# Patient Record
Sex: Male | Born: 1960 | Race: White | Hispanic: No | Marital: Single | Smoking: Never smoker
Health system: Southern US, Community
[De-identification: ages and names within clinical notes are randomized; demographics above are authoritative.]

## PROBLEM LIST (undated history)

## (undated) DIAGNOSIS — M779 Enthesopathy, unspecified: Secondary | ICD-10-CM

## (undated) DIAGNOSIS — C801 Malignant (primary) neoplasm, unspecified: Secondary | ICD-10-CM

## (undated) HISTORY — DX: Malignant (primary) neoplasm, unspecified: C80.1

---

## 1986-12-04 HISTORY — PX: HERNIA REPAIR: SHX51

## 2009-02-01 ENCOUNTER — Emergency Department (HOSPITAL_COMMUNITY): Admission: EM | Admit: 2009-02-01 | Discharge: 2009-02-01 | Payer: Self-pay | Admitting: Emergency Medicine

## 2017-06-18 ENCOUNTER — Emergency Department (HOSPITAL_COMMUNITY): Payer: Medicaid Other

## 2017-06-18 ENCOUNTER — Emergency Department (HOSPITAL_COMMUNITY)
Admission: EM | Admit: 2017-06-18 | Discharge: 2017-06-18 | Disposition: A | Discharge status: Home / Self Care | Payer: Medicaid Other | Attending: Emergency Medicine | Admitting: Emergency Medicine

## 2017-06-18 ENCOUNTER — Encounter (HOSPITAL_COMMUNITY): Payer: Self-pay

## 2017-06-18 DIAGNOSIS — F17290 Nicotine dependence, other tobacco product, uncomplicated: Secondary | ICD-10-CM | POA: Diagnosis not present

## 2017-06-18 DIAGNOSIS — M5432 Sciatica, left side: Secondary | ICD-10-CM | POA: Insufficient documentation

## 2017-06-18 DIAGNOSIS — M545 Low back pain: Secondary | ICD-10-CM | POA: Diagnosis present

## 2017-06-18 HISTORY — DX: Enthesopathy, unspecified: M77.9

## 2017-06-18 MED ORDER — MORPHINE SULFATE (PF) 4 MG/ML IV SOLN
4.0000 mg | Freq: Once | INTRAVENOUS | Status: AC
  Administered 2017-06-18: 4 mg via INTRAVENOUS
  Filled 2017-06-18: qty 1

## 2017-06-18 MED ORDER — PREDNISONE 50 MG PO TABS: 50.0000 mg | ORAL_TABLET | Freq: Every day | ORAL | 0 refills | Status: DC

## 2017-06-18 MED ORDER — NAPROXEN 375 MG PO TABS: 375.0000 mg | ORAL_TABLET | Freq: Two times a day (BID) | ORAL | 0 refills | Status: DC

## 2017-06-18 MED ORDER — CYCLOBENZAPRINE HCL 10 MG PO TABS: 10.0000 mg | ORAL_TABLET | Freq: Two times a day (BID) | ORAL | 0 refills | Status: DC | PRN

## 2017-06-18 MED ORDER — TRAMADOL HCL 50 MG PO TABS: 50.0000 mg | ORAL_TABLET | Freq: Four times a day (QID) | ORAL | 0 refills | Status: DC | PRN

## 2017-06-18 NOTE — ED Notes (Signed)
Bed: WA10 Expected date:  Expected time:  Means of arrival:  Comments: EMS/back pain 

## 2017-06-18 NOTE — Discharge Instructions (Signed)
Follow-up with a primary care doctor or neurosurgeon as we discussed. Take the medications to help with the pain and discomfort. Return to the emergency room for difficulty with weakness, worsening neurologic symptoms

## 2017-06-18 NOTE — ED Triage Notes (Signed)
Per EMS- patient c/o left lower back pain that radiates down the left leg, left knee. Paatient also c/o tingling of the left lower extremity. Patient was given Fentanyl 100 mcg Iv prior to arrival to the ED.

## 2017-06-18 NOTE — ED Notes (Signed)
Patient transported to X-ray 

## 2017-06-18 NOTE — ED Provider Notes (Signed)
[facility] DEPT Provider Note   CSN: [id number] Arrival date & time: 06/18/17  0806     History   Chief Complaint Chief Complaint  Patient presents with  . Back Pain    left lower  . Knee Pain    left    HPI Cory Ibarra is a 56 y.o. male.  HPI Patient presents to the emergency room with complaints of lower back pain. Patient states he's been having some mild intermittent symptoms starting about a month ago. He did not think too much of it. However he started having pain in the last couple of days in his left lower back radiating to his left hip and knee. Last night the pain was more severe. This morning was even worsening or difficulty standing. He gets a throbbing severe pain in his left thigh and knee originating from the lower back when he tries to move or stand. He also feels that the thigh and knee are numb. He denies any weakness. He denies any abdominal pain. No fevers or chills. No recent falls or injuries. However, he was helping a friend move this past weekend while he did not think he was lifting anything that heavy. Past Medical History:  Diagnosis Date  . Tendonitis     There are no active problems to display for this patient.   Past Surgical History:  Procedure Laterality Date  . HERNIA REPAIR         Home Medications    Prior to Admission medications   Medication Sig Start Date End Date Taking? Authorizing Provider  aspirin-acetaminophen-caffeine (EXCEDRIN MIGRAINE) [phone] MG tablet Take 1 tablet by mouth every 6 (six) hours as needed.   Yes [provider]  ibuprofen (ADVIL,MOTRIN) 200 MG tablet Take 400 mg by mouth every 6 (six) hours as needed for headache.   Yes [provider]  cyclobenzaprine (FLEXERIL) 10 MG tablet Take 1 tablet (10 mg total) by mouth 2 (two) times daily as needed for muscle spasms. 06/18/17   Dorie Rank, MD  naproxen (NAPROSYN) 375 MG tablet Take 1 tablet (375 mg total) by mouth 2 (two) times daily. 06/18/17    Dorie Rank, MD  predniSONE (DELTASONE) 50 MG tablet Take 1 tablet (50 mg total) by mouth daily. 06/18/17   Dorie Rank, MD  traMADol (ULTRAM) 50 MG tablet Take 1 tablet (50 mg total) by mouth every 6 (six) hours as needed. 06/18/17   Dorie Rank, MD    Family History History reviewed. No pertinent family history.  Social History Social History  Substance Use Topics  . Smoking status: Current Every Day Smoker    Types: E-cigarettes  . Smokeless tobacco: Never Used  . Alcohol use Yes     Comment: occasionally     Allergies   Bee venom   Review of Systems Review of Systems  All other systems reviewed and are negative.    Physical Exam Updated Vital Signs BP (!) 162/58   Pulse 80   Temp 97.9 F (36.6 C) (Oral)   Resp 16   Ht 1.88 m (6\' 2" )   Wt 104.3 kg (230 lb)   SpO2 100%   BMI 29.53 kg/m   Physical Exam  Constitutional: He appears well-developed and well-nourished. No distress.  HENT:  Head: Normocephalic and atraumatic.  Right Ear: External ear normal.  Left Ear: External ear normal.  Nose: Nose normal.  Eyes: Conjunctivae and EOM are normal. Right eye exhibits no discharge. Left eye exhibits no discharge. No scleral icterus.  Neck: Neck supple. No tracheal deviation present.  Cardiovascular: Normal rate, regular rhythm and normal heart sounds.   Pulmonary/Chest: Effort normal and breath sounds normal. No stridor. No respiratory distress. He has no wheezes.  Abdominal: Soft. He exhibits no distension and no mass. There is no tenderness. There is no guarding.  Musculoskeletal: He exhibits no edema.       Left hip: He exhibits tenderness. He exhibits no swelling and no deformity.       Left knee: Normal.       Lumbar back: He exhibits decreased range of motion, tenderness, pain and spasm. He exhibits no bony tenderness, no swelling and no edema.  Neurological: He is alert. He is not disoriented. A sensory deficit ( altered sensation left thigh to the knee) is  present. Cranial nerve deficit: no gross deficits. He exhibits normal muscle tone. Coordination normal.  5/5 plantar and dorsiflexion bilaterally, able to lift legs off the bed  Skin: Skin is warm and dry. No rash noted. He is not diaphoretic. No erythema.  Psychiatric: He has a normal mood and affect. His behavior is normal. Thought content normal.  Nursing note and vitals reviewed.    ED Treatments / Results    Radiology Dg Lumbar Spine Complete  Result Date: 06/18/2017 CLINICAL DATA:  Back pain. EXAM: LUMBAR SPINE - COMPLETE 4+ VIEW COMPARISON:  None. FINDINGS: There is no evidence of lumbar spine fracture. Alignment is normal. Intervertebral disc spaces are maintained. There is a calcification noted within the right lower quadrant of the abdomen which may represent an at appendicolith. IMPRESSION: Unremarkable appearance of the lumbar spine. Electronically Signed   By: Kerby Moors M.D.   On: 06/18/2017 09:14    Procedures Procedures (including critical care time)  Medications Ordered in ED Medications  morphine 4 MG/ML injection 4 mg (4 mg Intravenous Given 06/18/17 0849)     Initial Impression / Assessment and Plan / ED Course  I have reviewed the triage vital signs and the nursing notes.  Pertinent labs & imaging results that were available during my care of the patient were reviewed by me and considered in my medical decision making (see chart for details).  Clinical Course as of Jun 18 1010  Mon Jun 18, 2017  [id number] Lumbar spine xrays negative.    [JK]    Clinical Course User Index [JK] Dorie Rank, MD    No sign of acute neurological or vascular emergency associated with pt's back pain.  May have a component of sciatica.  Safe for outpatient follow up.   Final Clinical Impressions(s) / ED Diagnoses   Final diagnoses:  Sciatica of left side    New Prescriptions New Prescriptions   CYCLOBENZAPRINE (FLEXERIL) 10 MG TABLET    Take 1 tablet (10 mg total) by mouth  2 (two) times daily as needed for muscle spasms.   NAPROXEN (NAPROSYN) 375 MG TABLET    Take 1 tablet (375 mg total) by mouth 2 (two) times daily.   PREDNISONE (DELTASONE) 50 MG TABLET    Take 1 tablet (50 mg total) by mouth daily.   TRAMADOL (ULTRAM) 50 MG TABLET    Take 1 tablet (50 mg total) by mouth every 6 (six) hours as needed.     Dorie Rank, MD 06/18/17 1011

## 2017-06-28 ENCOUNTER — Emergency Department (HOSPITAL_COMMUNITY): Payer: Medicaid Other

## 2017-06-28 ENCOUNTER — Inpatient Hospital Stay (HOSPITAL_COMMUNITY): Payer: Medicaid Other

## 2017-06-28 ENCOUNTER — Ambulatory Visit: Admit: 2017-06-28 | Payer: Self-pay | Admitting: Radiation Oncology

## 2017-06-28 ENCOUNTER — Inpatient Hospital Stay (HOSPITAL_COMMUNITY)
Admission: EM | Admit: 2017-06-28 | Discharge: 2017-07-03 | DRG: 543 | Disposition: A | Discharge status: Home / Self Care | Payer: Medicaid Other | Attending: Internal Medicine | Admitting: Internal Medicine

## 2017-06-28 ENCOUNTER — Encounter (HOSPITAL_COMMUNITY): Payer: Self-pay | Admitting: *Deleted

## 2017-06-28 DIAGNOSIS — Z801 Family history of malignant neoplasm of trachea, bronchus and lung: Secondary | ICD-10-CM | POA: Diagnosis not present

## 2017-06-28 DIAGNOSIS — M899 Disorder of bone, unspecified: Secondary | ICD-10-CM

## 2017-06-28 DIAGNOSIS — R222 Localized swelling, mass and lump, trunk: Secondary | ICD-10-CM

## 2017-06-28 DIAGNOSIS — R1900 Intra-abdominal and pelvic swelling, mass and lump, unspecified site: Secondary | ICD-10-CM

## 2017-06-28 DIAGNOSIS — M8458XA Pathological fracture in neoplastic disease, other specified site, initial encounter for fracture: Secondary | ICD-10-CM | POA: Diagnosis present

## 2017-06-28 DIAGNOSIS — S32030A Wedge compression fracture of third lumbar vertebra, initial encounter for closed fracture: Secondary | ICD-10-CM | POA: Diagnosis present

## 2017-06-28 DIAGNOSIS — R911 Solitary pulmonary nodule: Secondary | ICD-10-CM

## 2017-06-28 DIAGNOSIS — Z8042 Family history of malignant neoplasm of prostate: Secondary | ICD-10-CM

## 2017-06-28 DIAGNOSIS — M5430 Sciatica, unspecified side: Secondary | ICD-10-CM | POA: Diagnosis present

## 2017-06-28 DIAGNOSIS — Z91030 Bee allergy status: Secondary | ICD-10-CM | POA: Diagnosis not present

## 2017-06-28 DIAGNOSIS — F17290 Nicotine dependence, other tobacco product, uncomplicated: Secondary | ICD-10-CM | POA: Diagnosis present

## 2017-06-28 DIAGNOSIS — S32030S Wedge compression fracture of third lumbar vertebra, sequela: Secondary | ICD-10-CM

## 2017-06-28 DIAGNOSIS — R1907 Generalized intra-abdominal and pelvic swelling, mass and lump: Secondary | ICD-10-CM

## 2017-06-28 DIAGNOSIS — C8330 Diffuse large B-cell lymphoma, unspecified site: Secondary | ICD-10-CM | POA: Diagnosis present

## 2017-06-28 DIAGNOSIS — R634 Abnormal weight loss: Secondary | ICD-10-CM | POA: Diagnosis present

## 2017-06-28 DIAGNOSIS — M549 Dorsalgia, unspecified: Secondary | ICD-10-CM

## 2017-06-28 DIAGNOSIS — M898X8 Other specified disorders of bone, other site: Secondary | ICD-10-CM

## 2017-06-28 DIAGNOSIS — M25552 Pain in left hip: Secondary | ICD-10-CM

## 2017-06-28 DIAGNOSIS — C799 Secondary malignant neoplasm of unspecified site: Secondary | ICD-10-CM

## 2017-06-28 LAB — COMPREHENSIVE METABOLIC PANEL
ALBUMIN: 3.9 g/dL (ref 3.5–5.0)
ALT: 30 U/L (ref 17–63)
AST: 45 U/L — AB (ref 15–41)
Alkaline Phosphatase: 101 U/L (ref 38–126)
Anion gap: 11 (ref 5–15)
BUN: 14 mg/dL (ref 6–20)
CHLORIDE: 104 mmol/L (ref 101–111)
CO2: 23 mmol/L (ref 22–32)
CREATININE: 0.84 mg/dL (ref 0.61–1.24)
Calcium: 9.1 mg/dL (ref 8.9–10.3)
GFR calc Af Amer: >60 mL/min (ref >60)
GFR calc non Af Amer: >60 mL/min (ref >60)
GLUCOSE: 108 mg/dL — AB (ref 65–99)
Potassium: 4.1 mmol/L (ref 3.5–5.1)
SODIUM: 138 mmol/L (ref 135–145)
Total Bilirubin: 1.6 mg/dL — ABNORMAL HIGH (ref 0.3–1.2)
Total Protein: 7.6 g/dL (ref 6.5–8.1)

## 2017-06-28 LAB — CBC WITH DIFFERENTIAL/PLATELET
Basophils Absolute: 0 10*3/uL (ref 0.0–0.1)
Basophils Relative: 0 %
EOS ABS: 0.1 10*3/uL (ref 0.0–0.7)
EOS PCT: 1 %
HCT: 40.7 % (ref 39.0–52.0)
HEMOGLOBIN: 14.1 g/dL (ref 13.0–17.0)
LYMPHS ABS: 1 10*3/uL (ref 0.7–4.0)
Lymphocytes Relative: 13 %
MCH: 26 pg (ref 26.0–34.0)
MCHC: 34.6 g/dL (ref 30.0–36.0)
MCV: 75.1 fL — ABNORMAL LOW (ref 78.0–100.0)
MONO ABS: 1 10*3/uL (ref 0.1–1.0)
MONOS PCT: 13 %
NEUTROS PCT: 73 %
Neutro Abs: 5.7 10*3/uL (ref 1.7–7.7)
Platelets: 332 10*3/uL (ref 150–400)
RBC: 5.42 MIL/uL (ref 4.22–5.81)
RDW: 14.7 % (ref 11.5–15.5)
WBC: 7.8 10*3/uL (ref 4.0–10.5)

## 2017-06-28 LAB — URINALYSIS, ROUTINE W REFLEX MICROSCOPIC
Bilirubin Urine: NEGATIVE
Glucose, UA: NEGATIVE mg/dL
Ketones, ur: NEGATIVE mg/dL
Leukocytes, UA: NEGATIVE
Nitrite: NEGATIVE
PH: 5 (ref 5.0–8.0)
Protein, ur: NEGATIVE mg/dL
SPECIFIC GRAVITY, URINE: 1.02 (ref 1.005–1.030)

## 2017-06-28 LAB — LACTATE DEHYDROGENASE: LDH: 461 U/L — AB (ref 98–192)

## 2017-06-28 MED ORDER — KETOROLAC TROMETHAMINE 30 MG/ML IJ SOLN
30.0000 mg | Freq: Four times a day (QID) | INTRAMUSCULAR | Status: DC | PRN
  Administered 2017-06-29: 30 mg via INTRAVENOUS
  Filled 2017-06-28: qty 1

## 2017-06-28 MED ORDER — SODIUM CHLORIDE 0.9 % IV SOLN: 250.0000 mL | INTRAVENOUS | Status: DC | PRN

## 2017-06-28 MED ORDER — MORPHINE SULFATE (PF) 2 MG/ML IV SOLN
4.0000 mg | Freq: Once | INTRAVENOUS | Status: AC
  Administered 2017-06-28: 4 mg via INTRAVENOUS
  Filled 2017-06-28: qty 2

## 2017-06-28 MED ORDER — DOCUSATE SODIUM 100 MG PO CAPS: 100.0000 mg | ORAL_CAPSULE | Freq: Every day | ORAL | Status: DC | PRN

## 2017-06-28 MED ORDER — DEXAMETHASONE SODIUM PHOSPHATE 4 MG/ML IJ SOLN
8.0000 mg | Freq: Two times a day (BID) | INTRAMUSCULAR | Status: DC
  Administered 2017-06-28 – 2017-07-02 (×8): 8 mg via INTRAVENOUS
  Filled 2017-06-28 (×9): qty 2

## 2017-06-28 MED ORDER — OXYCODONE-ACETAMINOPHEN 5-325 MG PO TABS
1.0000 | ORAL_TABLET | Freq: Once | ORAL | Status: AC
Start: 2017-06-28 — End: 2017-06-28
  Administered 2017-06-28: 1 via ORAL
  Filled 2017-06-28: qty 1

## 2017-06-28 MED ORDER — SODIUM CHLORIDE 0.9% FLUSH
3.0000 mL | INTRAVENOUS | Status: DC | PRN
  Administered 2017-07-02: 3 mL via INTRAVENOUS
  Filled 2017-06-28: qty 3

## 2017-06-28 MED ORDER — GADOBENATE DIMEGLUMINE 529 MG/ML IV SOLN
20.0000 mL | Freq: Once | INTRAVENOUS | Status: AC | PRN
  Administered 2017-06-28: 20 mL via INTRAVENOUS

## 2017-06-28 MED ORDER — OXYCODONE HCL 5 MG PO TABS
5.0000 mg | ORAL_TABLET | ORAL | Status: DC | PRN
  Administered 2017-06-28 – 2017-07-03 (×9): 5 mg via ORAL
  Filled 2017-06-28 (×9): qty 1

## 2017-06-28 MED ORDER — SODIUM CHLORIDE 0.9% FLUSH
3.0000 mL | Freq: Two times a day (BID) | INTRAVENOUS | Status: DC
  Administered 2017-06-28 – 2017-07-03 (×11): 3 mL via INTRAVENOUS

## 2017-06-28 MED ORDER — ONDANSETRON HCL 4 MG PO TABS
4.0000 mg | ORAL_TABLET | Freq: Four times a day (QID) | ORAL | Status: DC | PRN
  Administered 2017-07-01 – 2017-07-03 (×3): 4 mg via ORAL
  Filled 2017-06-28 (×3): qty 1

## 2017-06-28 MED ORDER — ONDANSETRON HCL 4 MG/2ML IJ SOLN: 4.0000 mg | Freq: Four times a day (QID) | INTRAMUSCULAR | Status: DC | PRN

## 2017-06-28 NOTE — ED Notes (Signed)
ED Provider at bedside. 

## 2017-06-28 NOTE — H&P (Signed)
History and Physical    Cory Ibarra WEX:[id number] DOB: December 01, 1961 DOA: 06/28/2017  PCP: Patient, No Pcp Per  Patient coming from:  home  Chief Complaint:   Severe pain  HPI: Cory Ibarra is a 56 y.o. male with medical history significant of progressive worsening pain and loss of appetite for about 6 weeks now.  Pt has not seen a doctor in many years.  He gets no surviellance chronically that is age appropriate.  He has no health insurance.  About 6 weeks ago he started with vague symptoms of mild abdominal discomfort.  This progressed to back pain in the lower area then bilateral hip pain much worse in the left hip and left knee pain.  He came to ED over a week ago, was diagnosed with sciatica given steroids.  He says he got a little better with the steroids but as soon as he finished the taper this past Saturday (5 days ago) the pain has become excruciating to the point where he cannot even stand without severe pain in his left hip and leg and lower back.  He denies any fevers.  Has not had an appetite for over a month and has lost an noticeable unknown amount of weight.  He denies any urinary symptoms.  No melena or hematochezia.  No hematuria.  He has a very strong family history of prostate cancer.  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  Dr Jana Hakim with our oncology service has been called and will be seeing the patient to provide a possible plan for urgent radiation therapy.  Review of Systems: As per HPI otherwise 10 point review of systems negative.   Past Medical History:  Diagnosis Date  . Tendonitis     Past Surgical History:  Procedure Laterality Date  . HERNIA REPAIR       reports that he has been smoking E-cigarettes.  He has never used smokeless tobacco. He reports that he drinks alcohol. He reports that he does not use drugs.  Allergies  Allergen Reactions  . Bee Venom Swelling    Fhx:   2 brothers with prostate cancer in the their 63s one brother with testicular cancer in his 37s.  No colon cancer or other types of cancer he is aware of  Prior to Admission medications   Medication Sig Start Date End Date Taking? Authorizing Provider  ibuprofen (ADVIL,MOTRIN) 200 MG tablet Take 200 mg by mouth every 6 (six) hours as needed for fever, headache, mild pain, moderate pain or cramping.    Yes [provider]  naproxen sodium (ANAPROX) 220 MG tablet Take 440 mg by mouth every 8 (eight) hours as needed (for pain).   Yes [provider]  cyclobenzaprine (FLEXERIL) 10 MG tablet Take 1 tablet (10 mg total) by mouth 2 (two) times daily as needed for muscle spasms. Patient not taking: Reported on 06/28/2017 06/18/17   Dorie Rank, MD  naproxen (NAPROSYN) 375 MG tablet Take 1 tablet (375 mg total) by mouth 2 (two) times daily. Patient not taking: Reported on 06/28/2017 06/18/17   Dorie Rank, MD  predniSONE (DELTASONE) 50 MG tablet Take 1 tablet (50 mg total) by mouth daily. Patient not taking: Reported on 06/28/2017 06/18/17   Dorie Rank, MD  traMADol (ULTRAM) 50 MG tablet Take 1 tablet (50 mg total) by mouth every 6 (six) hours as needed. Patient not taking: Reported on 06/28/2017 06/18/17   Tomi Bamberger,  Wille Glaser, MD    Physical Exam: Vitals:   06/28/17 0834 06/28/17 1206  BP: (!) 148/67 (!) 149/95  Pulse: (!) 102 (!) 102  Resp: 16 12  Temp: 98.1 F (36.7 C) 97.9 F (36.6 C)  TempSrc: Oral Oral  SpO2: 98% 99%    Constitutional: NAD, calm, comfortable Vitals:   06/28/17 0834 06/28/17 1206  BP: (!) 148/67 (!) 149/95  Pulse: (!) 102 (!) 102  Resp: 16 12  Temp: 98.1 F (36.7 C) 97.9 F (36.6 C)  TempSrc: Oral Oral  SpO2: 98% 99%   Eyes: PERRL, lids and conjunctivae normal ENMT: Mucous membranes are moist. Posterior pharynx clear of any exudate or lesions.Normal dentition.  Neck: normal, supple, no masses, no thyromegaly Respiratory: clear to auscultation bilaterally, no  wheezing, no crackles. Normal respiratory effort. No accessory muscle use.  Cardiovascular: Regular rate and rhythm, no murmurs / rubs / gallops. No extremity edema. 2+ pedal pulses. No carotid bruits.  Abdomen: no tenderness, no masses palpated. No hepatosplenomegaly. Bowel sounds positive.  Musculoskeletal: no clubbing / cyanosis. No joint deformity upper and lower extremities. Good ROM, no contractures. Normal muscle tone.  Skin: no rashes, lesions, ulcers. No induration Neurologic: CN 2-12 grossly intact. Sensation intact, DTR normal. Strength 5/5 in all 4.  Psychiatric: Normal judgment and insight. Alert and oriented x 3. Normal mood.    Labs on Admission: I have personally reviewed following labs and imaging studies  CBC:  Recent Labs Lab 06/28/17 0919  WBC 7.8  NEUTROABS 5.7  HGB 14.1  HCT 40.7  MCV 75.1*  PLT 950   Basic Metabolic Panel:  Recent Labs Lab 06/28/17 0919  NA 138  K 4.1  CL 104  CO2 23  GLUCOSE 108*  BUN 14  CREATININE 0.84  CALCIUM 9.1   GFR: Estimated Creatinine Clearance: 123.4 mL/min (by C-G formula based on SCr of 0.84 mg/dL). Liver Function Tests:  Recent Labs Lab 06/28/17 0919  AST 45*  ALT 30  ALKPHOS 101  BILITOT 1.6*  PROT 7.6  ALBUMIN 3.9    Urine analysis:    Component Value Date/Time   COLORURINE YELLOW 06/28/2017 [place] 06/28/2017 0937   LABSPEC 1.020 06/28/2017 0937   PHURINE 5.0 06/28/2017 0937   GLUCOSEU NEGATIVE 06/28/2017 0937   HGBUR SMALL (A) 06/28/2017 [employer] 06/28/2017 [place] 06/28/2017 0937   PROTEINUR NEGATIVE 06/28/2017 0937   NITRITE NEGATIVE 06/28/2017 0937   LEUKOCYTESUR NEGATIVE 06/28/2017 9326   Radiological Exams on Admission: Dg Knee 2 Views Left  Result Date: 06/28/2017 CLINICAL DATA:  Left hip and leg pain which is worsening. EXAM: LEFT KNEE - 1-2 VIEW COMPARISON:  None. FINDINGS: No joint effusion. No joint space narrowing. No  significant osteophytes. No sign of avascular necrosis. Benign sclerotic focus in the posterior aspect of the proximal tibia, not significant. IMPRESSION: Negative.  No cause of pain identified. Electronically Signed   By: Nelson Chimes M.D.   On: 06/28/2017 09:59   Ct Renal Stone Study  Result Date: 06/28/2017 CLINICAL DATA:  Patient with left flank pain.  Difficulty standing. EXAM: CT ABDOMEN AND PELVIS WITHOUT CONTRAST TECHNIQUE: Multidetector CT imaging of the abdomen and pelvis was performed following the standard protocol without IV contrast. COMPARISON:  None. FINDINGS: Lower chest: Normal heart size. Dependent atelectasis within the bilateral lower lobes. Hepatobiliary: Liver is normal in size and contour. Gallbladder is unremarkable. Pancreas: Unremarkable Spleen: Unremarkable Adrenals/Urinary Tract: The adrenal glands are normal.  Bilateral renal stones, right-greater-than-left, including a 9 mm stone inferior pole right kidney. Punctate stone identified within the inferior pole of the left kidney. There is a nonobstructing 8 mm stone within the mid aspect of the right kidney (image 45; series 2). Mild left pelviectasis. Urinary bladder is unremarkable. Stomach/Bowel: Small hiatal hernia. Normal morphology of the stomach. No evidence for bowel obstruction. No free fluid or free intraperitoneal air. Normal appendix. Vascular/Lymphatic: Normal caliber abdominal aorta. There is a 6.7 x 5.1 cm soft tissue mass within the left periaortic location (image 35; series 2). Anteriorly there is an additional 3.6 x 3.3 cm left periaortic soft tissue mass (image 44; series 2). Reproductive: Mildly enlarged prostate. Other: Fat containing left inguinal hernia. Musculoskeletal: There is a 5.6 x 3.9 cm soft tissue mass interspersed between the anterior right sixth and seventh ribs, incompletely included. Patchy lucencies demonstrated within the T10 vertebral body extending to the right posterior elements (image 96;  series 6). Mild superior endplate compression deformity at this level. There is a soft tissue mass involving the right posterior elements at the T12 vertebral level which measures 3.9 x 3.6 cm with associated osseous destruction. Soft tissue mass extends into the canal at this level (image 21; series 2). Small oval lucent lesion within the posterior L1 vertebral body. Patchy lucency throughout the L3 vertebral body which is collapsed involving both the superior and inferior endplates. Soft tissue mass extends into the left aspect of the posterior elements as well as into the spinal canal with associated marked narrowing. Soft tissue mass at this location measures up to 4 cm. Patchy lucencies demonstrated within the pubic symphysis bilaterally (image 84; series 2). Lucent lesion within the right ilium with associated soft tissue mass (image 58; series 2). IMPRESSION: Multiple aggressive osseous lesions with associated soft tissue masses demonstrated within the lower thoracic and lumbar spine as well as pelvis and right anterior chest wall. Additionally there are two large retroperitoneal soft tissue masses within the left periaortic location. Overall findings are concerning for metastatic disease or myeloma. Osseous destruction and soft tissue mass involving the L3 vertebral body with approximately 75% collapse at this level. The mass extends to the posterior elements and into the spinal canal were there is marked of narrowing. Osseous destruction of the posterior elements at the T12 vertebral level with extension of the soft tissue mass into the canal at this level. Aggressive osseous lesion involving the posterior T10 vertebral body extending to the posterior elements. Given these extensive findings involving the spine and spinal canal, consider further evaluation with MRI of the thoracic and lumbar spine. Mild left pelviectasis, potentially secondary to mass effect from the large adjacent retroperitoneal mass.  Bilateral nephrolithiasis. Nonobstructing 8 mm stone within the mid right ureter. These results were called by telephone at the time of interpretation on 06/28/2017 at 11:24 am to Dr. Nanda Quinton , who verbally acknowledged these results. Electronically Signed   By: Lovey Newcomer M.D.   On: 06/28/2017 11:30   Dg Hip Unilat W Or Wo Pelvis 2-3 Views Left  Result Date: 06/28/2017 CLINICAL DATA:  Worsening low back and left hip pain. EXAM: DG HIP (WITH OR WITHOUT PELVIS) 2-3V LEFT COMPARISON:  None. FINDINGS: Bones of the pelvis appear normal. Sacroiliac joints and symphysis pubis appear normal. Both hip joints appear normal. No evidence of osteoarthritis, avascular necrosis or other finding. Phleboliths are noted in the pelvis and lower abdomen. IMPRESSION: Negative.  No abnormality seen to explain hip pain. Electronically Signed  By: Nelson Chimes M.D.   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  May need emergent/urgent radiation.  Oncology has been called and will see patient soon.  Will admit to [facility] unless otherwise advised to transfer to [facility] by onc team.  Keep npo for now and hold anticoagulants for potential biopsy arrangement in the near future. Neuro exam benign now, will obtain freq neurovascular checks for the next day or so.  Active Problems:   Mass of abdomen- as above, pt aware this looks very worrisome for malignancy   Severe back pain- try toradol and oral oxycodone.  Titrate up as pt needs to achieve good pain relief   Weight loss, unintentional- as above   CM consulted also due to lack of health insurance  DVT prophylaxis: scds Code  Status:  full Family Communication: none  Disposition Plan:  Per day team Consults called:  oncology Admission status:  admission   Coretta Leisey A MD [facility]  If 7PM-7AM, please contact night-coverage [email or web address] Password [email or web address]  06/28/2017, 1:45 PM

## 2017-06-28 NOTE — Consult Note (Signed)
[place]  [phone] [phone]     ID: Cory Ibarra DOB: 1961/03/10  MR#: [id number]  DUK#:[id number]  Patient Care Team: Patient, No Pcp Per as PCP - General (General Practice) Chauncey Cruel, MD OTHER MD:  CHIEF COMPLAINT: left hip pain  CURRENT TREATMENT: workup in progress   HISTORY OF PRESENT ILLNESS: [name] was evaluated for sciatica in the [facility] 06/18/2017. Plain lumbar spine films were unremarkble. He was treated with steroids and analgesics and dis well until those medicaitions ran out. He then presented to the ED today with the same complaints. Plain film s were again negative but a CT renal stone study showed multiple bone lesions and soft tissue masses chiefly in the lower abdomen and retroperitoneum. MRI of the thoracolumbar spine showed the same and in addition an L3 burst fracture with moderate spinal canal and neural foraminal stenosis which may be the locus of the patient's pain.   We were consulted to guide workup,  INTERVAL HISTORY: I met with the patient in his hospital room; no family present  REVIEW OF SYSTEMS: Aside from the pain he denies systemic symptoms such as unexplained weight loss of unexplained fatigue. There have been no fevers or drenching sweats. He passed some blood from his penis today (note no renal stones on scan). The pain is better when he is off his feet. He tells me his urine stream is fair and he had a normal BM within the past 2 days, no melena or BRBPR.  PAST MEDICAL HISTORY: Past Medical History:  Diagnosis Date  . Tendonitis     PAST SURGICAL HISTORY: Past Surgical History:  Procedure Laterality Date  . HERNIA REPAIR      FAMILY HISTORY The patient's mother, a former pediatrician, died at age 9 from heart problems. The patient's father died at 86 from lung cancer. The patient had 4 brothers, 2 dead (one from sepsis, the second afted a fall), and 2 sisters. There is no other family history  of cancer to his knowledge  SOCIAL HISTORY:  Single, currently unemployed, works in Investment banker, corporate intermittently, lives with his brother Ronalee Belts who works for [employer].    ADVANCED DIRECTIVES: not in place   HEALTH MAINTENANCE: Social History  Substance Use Topics  . Smoking status: Current Every Day Smoker    Types: E-cigarettes  . Smokeless tobacco: Never Used  . Alcohol use Yes     Comment: occasionally     Colonoscopy:  PAP:  Bone density:   Allergies  Allergen Reactions  . Bee Venom Swelling    Current Facility-Administered Medications  Medication Dose Route Frequency Provider Last Rate Last Dose  . 0.9 %  sodium chloride infusion  250 mL Intravenous PRN Phillips Grout, MD      . dexamethasone (DECADRON) injection 8 mg  8 mg Intravenous Q12H Magrinat, Virgie Dad, MD      . docusate sodium (COLACE) capsule 100 mg  100 mg Oral Daily PRN Phillips Grout, MD      . ketorolac (TORADOL) 30 MG/ML injection 30 mg  30 mg Intravenous Q6H PRN Phillips Grout, MD      . ondansetron (ZOFRAN) tablet 4 mg  4 mg Oral Q6H PRN Phillips Grout, MD       Or  . ondansetron (ZOFRAN) injection 4 mg  4 mg Intravenous Q6H PRN Derrill Kay A, MD      . oxyCODONE (Oxy IR/ROXICODONE) immediate release tablet 5 mg  5 mg Oral  Q4H PRN Phillips Grout, MD   5 mg at 06/28/17 1526  . sodium chloride flush (NS) 0.9 % injection 3 mL  3 mL Intravenous Q12H Derrill Kay A, MD   3 mL at 06/28/17 1438  . sodium chloride flush (NS) 0.9 % injection 3 mL  3 mL Intravenous PRN Phillips Grout, MD        OBJECTIVE: middle aged White man examined in bed  Vitals:   06/28/17 1206 06/28/17 1500  BP: (!) 149/95 (!) 124/53  Pulse: (!) 102 (!) 106  Resp: 12 13  Temp: 97.9 F (36.6 C) 97.7 F (36.5 C)     Body mass index is 26 kg/m.   Wt Readings from Last 3 Encounters:  06/28/17 202 lb 8 oz (91.9 kg)  06/18/17 230 lb (104.3 kg)    Ocular: Sclerae unicteric, pupils round and equal Lymphatic: No cervical or  supraclavicular adenopathy, no axillary adenopathy Lungs no rales or rhonchi Heart regular rate and rhythm Abd soft, nontender, positive bowel sounds MSK no focal spinal tenderness, no joint edema Neuro: non-focal, bilateral dorsiflexion 5/5, hip flexion 5/5, on left slightly compromised by pain, no sensory level; well-oriented, appropriate affect   LAB RESULTS:  CMP     Component Value Date/Time   NA 138 06/28/2017 0919   K 4.1 06/28/2017 0919   CL 104 06/28/2017 0919   CO2 23 06/28/2017 0919   GLUCOSE 108 (H) 06/28/2017 0919   BUN 14 06/28/2017 0919   CREATININE 0.84 06/28/2017 0919   CALCIUM 9.1 06/28/2017 0919   PROT 7.6 06/28/2017 0919   ALBUMIN 3.9 06/28/2017 0919   AST 45 (H) 06/28/2017 0919   ALT 30 06/28/2017 0919   ALKPHOS 101 06/28/2017 0919   BILITOT 1.6 (H) 06/28/2017 0919   GFRNONAA >60 06/28/2017 0919   GFRAA >60 06/28/2017 0919    No results found for: TOTALPROTELP, ALBUMINELP, A1GS, A2GS, BETS, BETA2SER, GAMS, MSPIKE, SPEI  No results found for: KPAFRELGTCHN, LAMBDASER, KAPLAMBRATIO  Lab Results  Component Value Date   WBC 7.8 06/28/2017   NEUTROABS 5.7 06/28/2017   HGB 14.1 06/28/2017   HCT 40.7 06/28/2017   MCV 75.1 (L) 06/28/2017   PLT 332 06/28/2017    _0 @  No results found for: LABCA2  No components found for: [id number]  No results for input(s): INR in the last 168 hours.  Urinalysis    Component Value Date/Time   COLORURINE YELLOW 06/28/2017 [place] 06/28/2017 0937   LABSPEC 1.020 06/28/2017 0937   PHURINE 5.0 06/28/2017 0937   GLUCOSEU NEGATIVE 06/28/2017 0937   HGBUR SMALL (A) 06/28/2017 0937   BILIRUBINUR NEGATIVE 06/28/2017 0937   KETONESUR NEGATIVE 06/28/2017 0937   PROTEINUR NEGATIVE 06/28/2017 0937   NITRITE NEGATIVE 06/28/2017 0937   LEUKOCYTESUR NEGATIVE 06/28/2017 2694     STUDIES: Dg Lumbar Spine Complete  Result Date: 06/18/2017 CLINICAL DATA:  Back pain. EXAM: LUMBAR SPINE -  COMPLETE 4+ VIEW COMPARISON:  None. FINDINGS: There is no evidence of lumbar spine fracture. Alignment is normal. Intervertebral disc spaces are maintained. There is a calcification noted within the right lower quadrant of the abdomen which may represent an at appendicolith. IMPRESSION: Unremarkable appearance of the lumbar spine. Electronically Signed   By: Kerby Moors M.D.   On: 06/18/2017 09:14   Dg Knee 2 Views Left  Result Date: 06/28/2017 CLINICAL DATA:  Left hip and leg pain which is worsening. EXAM: LEFT KNEE - 1-2 VIEW COMPARISON:  None. FINDINGS: No  joint effusion. No joint space narrowing. No significant osteophytes. No sign of avascular necrosis. Benign sclerotic focus in the posterior aspect of the proximal tibia, not significant. IMPRESSION: Negative.  No cause of pain identified. Electronically Signed   By: Nelson Chimes M.D.   On: 06/28/2017 09:59   Mr Thoracic Spine W Wo Contrast  Result Date: 06/28/2017 CLINICAL DATA:  multiple osseous lesions.  Possible myeloma. EXAM: MRI THORACIC AND LUMBAR SPINE WITHOUT AND WITH CONTRAST TECHNIQUE: Multiplanar and multiecho pulse sequences of the thoracic and lumbar spine were obtained without and with intravenous contrast. CONTRAST:  73m MULTIHANCE GADOBENATE DIMEGLUMINE 529 MG/ML IV SOLN COMPARISON:  CT abdomen pelvis 06/28/2017 FINDINGS: MRI THORACIC SPINE FINDINGS Alignment:  Normal Vertebrae: There are multiple osseous lesions, as seen on the concomitant CT: -- T2, left transverse process -- T6, inferior vertebral body, small lesion -- T7, large lesion involving most of the vertebral body -- T8, large lesion of the left pedicle extending into the left T7-T8 foramen and involving the most proximal aspect of the adjacent rib -- T10, large lesion involving most of the vertebral body. Mild superior endplate compression. --T12, large lesion centered within the right pedicle, involving the posterior vertebral body and lateral aspect of the right  lamina, as well as the adjacent proximal rib. This mass also invades the T11-12 and T12-L1 neural foramina. Cord:  Normal signal and morphology. Paraspinal and other soft tissues: No visible paraspinal or retroperitoneal abnormality. Please see dedicated report for concomitant CT of the abdomen and pelvis. Disc levels: T7-T8: The left neural foramen is severely narrowed by the adjacent soft tissue mass. No central spinal canal or right neural foraminal stenosis. T11-T12: Mild narrowing of the right neural foramen secondary to adjacent soft tissue mass. MRI LUMBAR SPINE FINDINGS Segmentation:  Normal Alignment:  Normal Vertebrae: There is a a large lesion of the L3 vertebral body with an associated burst fracture with approximately 50% height loss and retropulsion measuring 6 mm. There is a small lesion of the right L4 pedicle. Conus medullaris: Extends to the lower L1 level and appears normal. Paraspinal and other soft tissues: Left prevertebral mass at the L1 level measures 3.1 x 1.6 x 3.9 cm. Large para- aortic mass at the level of the left renal vessels measures 5.8 x 4.0 x 9.4 cm. Disc levels: T12-L1: There is mild right foraminal stenosis due to invasion by the right pedicular soft tissue mass. L1-L2: Normal disc space and facets. No spinal canal or neuroforaminal stenosis. L2-L3: At the inferior aspect of the disc space, there is severe narrowing of the left lateral recess due to retropulsion of the superior endplate of L3. This displaces the descending nerve root. The soft tissue component of the L3 mass is in close proximity to the exiting left L2 root. At the mid L3 level, there is moderate spinal canal stenosis. L3-L4: Moderate left neural foraminal stenosis secondary to invasion by L3 mass. No spinal canal stenosis. L4-L5: Small central disc protrusion without stenosis. L5-S1: Normal disc space and facets. No spinal canal or neuroforaminal stenosis. Visualized sacrum: Lesion within the right sacral ala  measures up to 3.4 cm. IMPRESSION: 1. Pathologic burst fracture of the L3 vertebral body with 6 mm of retropulsion and approximately 50% height loss, resulting and severe left lateral recess narrowing at L2-L3, moderate spinal canal stenosis directly posterior to the L3 vertebral body and moderate left L3-4 neural foraminal stenosis. 2. Multiple other osseous metastatic lesions at the T2, T6, T7, T8, T10, T12  and L4 levels and within the right sacral ala. Neural foraminal invasion at T7-T8, T11-T12 and T12-L1. 3. Multiple retroperitoneal soft tissue masses. Electronically Signed   By: Ulyses Jarred M.D.   On: 06/28/2017 15:37   Mr Lumbar Spine W Wo Contrast  Result Date: 06/28/2017 CLINICAL DATA:  multiple osseous lesions.  Possible myeloma. EXAM: MRI THORACIC AND LUMBAR SPINE WITHOUT AND WITH CONTRAST TECHNIQUE: Multiplanar and multiecho pulse sequences of the thoracic and lumbar spine were obtained without and with intravenous contrast. CONTRAST:  64m MULTIHANCE GADOBENATE DIMEGLUMINE 529 MG/ML IV SOLN COMPARISON:  CT abdomen pelvis 06/28/2017 FINDINGS: MRI THORACIC SPINE FINDINGS Alignment:  Normal Vertebrae: There are multiple osseous lesions, as seen on the concomitant CT: -- T2, left transverse process -- T6, inferior vertebral body, small lesion -- T7, large lesion involving most of the vertebral body -- T8, large lesion of the left pedicle extending into the left T7-T8 foramen and involving the most proximal aspect of the adjacent rib -- T10, large lesion involving most of the vertebral body. Mild superior endplate compression. --T12, large lesion centered within the right pedicle, involving the posterior vertebral body and lateral aspect of the right lamina, as well as the adjacent proximal rib. This mass also invades the T11-12 and T12-L1 neural foramina. Cord:  Normal signal and morphology. Paraspinal and other soft tissues: No visible paraspinal or retroperitoneal abnormality. Please see dedicated  report for concomitant CT of the abdomen and pelvis. Disc levels: T7-T8: The left neural foramen is severely narrowed by the adjacent soft tissue mass. No central spinal canal or right neural foraminal stenosis. T11-T12: Mild narrowing of the right neural foramen secondary to adjacent soft tissue mass. MRI LUMBAR SPINE FINDINGS Segmentation:  Normal Alignment:  Normal Vertebrae: There is a a large lesion of the L3 vertebral body with an associated burst fracture with approximately 50% height loss and retropulsion measuring 6 mm. There is a small lesion of the right L4 pedicle. Conus medullaris: Extends to the lower L1 level and appears normal. Paraspinal and other soft tissues: Left prevertebral mass at the L1 level measures 3.1 x 1.6 x 3.9 cm. Large para- aortic mass at the level of the left renal vessels measures 5.8 x 4.0 x 9.4 cm. Disc levels: T12-L1: There is mild right foraminal stenosis due to invasion by the right pedicular soft tissue mass. L1-L2: Normal disc space and facets. No spinal canal or neuroforaminal stenosis. L2-L3: At the inferior aspect of the disc space, there is severe narrowing of the left lateral recess due to retropulsion of the superior endplate of L3. This displaces the descending nerve root. The soft tissue component of the L3 mass is in close proximity to the exiting left L2 root. At the mid L3 level, there is moderate spinal canal stenosis. L3-L4: Moderate left neural foraminal stenosis secondary to invasion by L3 mass. No spinal canal stenosis. L4-L5: Small central disc protrusion without stenosis. L5-S1: Normal disc space and facets. No spinal canal or neuroforaminal stenosis. Visualized sacrum: Lesion within the right sacral ala measures up to 3.4 cm. IMPRESSION: 1. Pathologic burst fracture of the L3 vertebral body with 6 mm of retropulsion and approximately 50% height loss, resulting and severe left lateral recess narrowing at L2-L3, moderate spinal canal stenosis directly  posterior to the L3 vertebral body and moderate left L3-4 neural foraminal stenosis. 2. 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 Neural foraminal invasion  at T7-T8, T11-T12 and T12-L1. 3. Multiple retroperitoneal soft tissue masses. Electronically Signed   By: Ulyses Jarred M.D.   On: 06/28/2017 15:37   Ct Renal Stone Study  Result Date: 06/28/2017 CLINICAL DATA:  Patient with left flank pain.  Difficulty standing. EXAM: CT ABDOMEN AND PELVIS WITHOUT CONTRAST TECHNIQUE: Multidetector CT imaging of the abdomen and pelvis was performed following the standard protocol without IV contrast. COMPARISON:  None. FINDINGS: Lower chest: Normal heart size. Dependent atelectasis within the bilateral lower lobes. Hepatobiliary: Liver is normal in size and contour. Gallbladder is unremarkable. Pancreas: Unremarkable Spleen: Unremarkable Adrenals/Urinary Tract: The adrenal glands are normal. Bilateral renal stones, right-greater-than-left, including a 9 mm stone inferior pole right kidney. Punctate stone identified within the inferior pole of the left kidney. There is a nonobstructing 8 mm stone within the mid aspect of the right kidney (image 45; series 2). Mild left pelviectasis. Urinary bladder is unremarkable. Stomach/Bowel: Small hiatal hernia. Normal morphology of the stomach. No evidence for bowel obstruction. No free fluid or free intraperitoneal air. Normal appendix. Vascular/Lymphatic: Normal caliber abdominal aorta. There is a 6.7 x 5.1 cm soft tissue mass within the left periaortic location (image 35; series 2). Anteriorly there is an additional 3.6 x 3.3 cm left periaortic soft tissue mass (image 44; series 2). Reproductive: Mildly enlarged prostate. Other: Fat containing left inguinal hernia. Musculoskeletal: There is a 5.6 x 3.9 cm soft tissue mass interspersed between the anterior right sixth and seventh ribs, incompletely included.  Patchy lucencies demonstrated within the T10 vertebral body extending to the right posterior elements (image 96; series 6). Mild superior endplate compression deformity at this level. There is a soft tissue mass involving the right posterior elements at the T12 vertebral level which measures 3.9 x 3.6 cm with associated osseous destruction. Soft tissue mass extends into the canal at this level (image 21; series 2). Small oval lucent lesion within the posterior L1 vertebral body. Patchy lucency throughout the L3 vertebral body which is collapsed involving both the superior and inferior endplates. Soft tissue mass extends into the left aspect of the posterior elements as well as into the spinal canal with associated marked narrowing. Soft tissue mass at this location measures up to 4 cm. Patchy lucencies demonstrated within the pubic symphysis bilaterally (image 84; series 2). Lucent lesion within the right ilium with associated soft tissue mass (image 58; series 2). IMPRESSION: Multiple aggressive osseous lesions with associated soft tissue masses demonstrated within the lower thoracic and lumbar spine as well as pelvis and right anterior chest wall. Additionally there are two large retroperitoneal soft tissue masses within the left periaortic location. Overall findings are concerning for metastatic disease or myeloma. Osseous destruction and soft tissue mass involving the L3 vertebral body with approximately 75% collapse at this level. The mass extends to the posterior elements and into the spinal canal were there is marked of narrowing. Osseous destruction of the posterior elements at the T12 vertebral level with extension of the soft tissue mass into the canal at this level. Aggressive osseous lesion involving the posterior T10 vertebral body extending to the posterior elements. Given these extensive findings involving the spine and spinal canal, consider further evaluation with MRI of the thoracic and lumbar  spine. Mild left pelviectasis, potentially secondary to mass effect from the large adjacent retroperitoneal mass. Bilateral nephrolithiasis. Nonobstructing 8 mm stone within the mid right ureter. These results were called by telephone at the time of interpretation on 06/28/2017 at 11:24 am to Dr.  JOSHUA LONG , who verbally acknowledged these results. Electronically Signed   By: Lovey Newcomer M.D.   On: 06/28/2017 11:30   Dg Hip Unilat W Or Wo Pelvis 2-3 Views Left  Result Date: 06/28/2017 CLINICAL DATA:  Worsening low back and left hip pain. EXAM: DG HIP (WITH OR WITHOUT PELVIS) 2-3V LEFT COMPARISON:  None. FINDINGS: Bones of the pelvis appear normal. Sacroiliac joints and symphysis pubis appear normal. Both hip joints appear normal. No evidence of osteoarthritis, avascular necrosis or other finding. Phleboliths are noted in the pelvis and lower abdomen. IMPRESSION: Negative.  No abnormality seen to explain hip pain. Electronically Signed   By: Nelson Chimes M.D.   On: 06/28/2017 09:59     ASSESSMENT: 56 y.o. [place] man presenting with left hip pain, found to have multiple bone lesions and pelvic/retroperitoneal masses suggestive of cancer, more specifically of prostate cancer  PLAN: I am obtaining a CXR to r/o a primary lung lesion, and a PSA. Am starting his on steroids. Will consult neurosurgery and radiation oncology.At this time exam is not suggestive of cord compression. He will need Bx of one of the more accessible lesions and we will ask IR's help in this.  I discussed the plan with the patient who is in agreement. Will follow with you  Chauncey Cruel, MD   06/28/2017 6:18 PM Medical Oncology and Hematology [facility] [address] [place], [place] [address] Tel. [phone]    Fax. [phone]

## 2017-06-28 NOTE — ED Notes (Signed)
Bladder Scan <121

## 2017-06-28 NOTE — ED Notes (Signed)
Patient transported to MRI 

## 2017-06-28 NOTE — ED Triage Notes (Signed)
EMS reports  patient is out of pain meds and his sciatica pain has returned. IV #20 RH 100 Fentynal given IV.

## 2017-06-28 NOTE — ED Provider Notes (Signed)
Emergency Department Provider Note   I have reviewed the triage vital signs and the nursing notes.   HISTORY  Chief Complaint Sciatica   HPI Cory Ibarra is a 56 y.o. male who presents to the emergency department for evaluation of left buttock and left thigh pain. Symptoms began approximately one week ago. Denies any inciting event or injury. Patient had bilateral symptoms initially but soon his left side began hurting much worse. He came to the emergency department which point he was diagnosed with sciatica and discharged home with short steroid course, pain medication, NSAID, and muscle relaxer. Symptoms improved significantly but returned shortly after stopping steroids. The patient has a appointment with the neurosurgeon on 07/13/17 but no PCP. He denies any weakness or numbness. Patient feels that most of his pain is actually coming from his left kneecap and describes it as a severe throbbing pain up the leg. Denies any leg swelling. No groin numbness. No instrumentation to the spine. No urinary retention. No bowel or bladder incontinence. No fevers or chills. The patient has had episodes of diaphoresis which he attributes to pain. Patient states has been ambulatory short distances at home but has significant pain. Also with pain when sitting.    Past Medical History:  Diagnosis Date  . Tendonitis     Patient Active Problem List   Diagnosis Date Noted  . Mass of abdomen 06/28/2017  . Closed compression fracture of L3 lumbar vertebra ([place]) 06/28/2017  . Severe back pain 06/28/2017  . Weight loss, unintentional 06/28/2017    Past Surgical History:  Procedure Laterality Date  . HERNIA REPAIR      Current Outpatient Rx  . Order #: [id number] Class: Historical Med  . Order #: [id number] Class: Historical Med  . Order #: [id number] Class: Print  . Order #: [id number] Class: Print  . Order #: [id number] Class: Print  . Order #: [id number] Class: Print    Allergies Bee venom  History  reviewed. No pertinent family history.  Social History Social History  Substance Use Topics  . Smoking status: Current Every Day Smoker    Types: E-cigarettes  . Smokeless tobacco: Never Used  . Alcohol use Yes     Comment: occasionally    Review of Systems  Constitutional: No fever/chills Eyes: No visual changes. ENT: No sore throat. Cardiovascular: Denies chest pain. Respiratory: Denies shortness of breath. Gastrointestinal: No abdominal pain.  No nausea, no vomiting.  No diarrhea.  No constipation. Genitourinary: Negative for dysuria. Musculoskeletal: Positive for back pain and left leg pain.  Skin: Negative for rash. Neurological: Negative for headaches, focal weakness or numbness.  10-point ROS otherwise negative.  ____________________________________________   PHYSICAL EXAM:  VITAL SIGNS: ED Triage Vitals  Enc Vitals Group     BP 06/28/17 0834 (!) 148/67     Pulse Rate 06/28/17 0834 (!) 102     Resp 06/28/17 0834 16     Temp 06/28/17 0834 98.1 F (36.7 C)     Temp Source 06/28/17 0834 Oral     SpO2 06/28/17 0834 98 %     Pain Score 06/28/17 0835 4   Constitutional: Alert and oriented. Well appearing but diaphoretic.  Eyes: Conjunctivae are normal. Head: Atraumatic. Nose: No congestion/rhinnorhea. Mouth/Throat: Mucous membranes are moist.  Oropharynx non-erythematous. Neck: No stridor.   Cardiovascular: Normal rate, regular rhythm. Good peripheral circulation. Grossly normal heart sounds.   Respiratory: Normal respiratory effort.  No retractions. Lungs CTAB. Gastrointestinal: Soft and nontender. No distention.  Musculoskeletal: No lower extremity tenderness nor edema.  No gross deformities of extremities. Mild pain with ROM of the left hip and knee. No midline spine tenderness.  Neurologic:  Normal speech and language. No gross focal neurologic deficits are appreciated. 2+ patellar and achilles reflexes bilaterally. Normal sensation in B/L LEs. Somewhat  hesitant gait but otherwise normal.  Skin:  Skin is warm, dry and intact. No rash noted.  ____________________________________________   LABS (all labs ordered are listed, but only abnormal results are displayed)  Labs Reviewed  COMPREHENSIVE METABOLIC PANEL - Abnormal; Notable for the following:       Result Value   Glucose, Bld 108 (*)    AST 45 (*)    Total Bilirubin 1.6 (*)    All other components within normal limits  CBC WITH DIFFERENTIAL/PLATELET - Abnormal; Notable for the following:    MCV 75.1 (*)    All other components within normal limits  URINALYSIS, ROUTINE W REFLEX MICROSCOPIC - Abnormal; Notable for the following:    Hgb urine dipstick SMALL (*)    Bacteria, UA RARE (*)    Squamous Epithelial / LPF 0-5 (*)    All other components within normal limits  LACTATE DEHYDROGENASE - Abnormal; Notable for the following:    LDH 461 (*)    All other components within normal limits  CEA  CANCER ANTIGEN 19-9  BETA 2 MICROGLOBULIN, SERUM  HIV ANTIBODY (ROUTINE TESTING)   ____________________________________________  RADIOLOGY  Dg Knee 2 Views Left  Result Date: 06/28/2017 CLINICAL DATA:  Left hip and leg pain which is worsening. EXAM: LEFT KNEE - 1-2 VIEW COMPARISON:  None. FINDINGS: No joint effusion. No joint space narrowing. No significant osteophytes. No sign of avascular necrosis. Benign sclerotic focus in the posterior aspect of the proximal tibia, not significant. IMPRESSION: Negative.  No cause of pain identified. Electronically Signed   By: Nelson Chimes M.D.   On: 06/28/2017 09:59   Ct Renal Stone Study  Result Date: 06/28/2017 CLINICAL DATA:  Patient with left flank pain.  Difficulty standing. EXAM: CT ABDOMEN AND PELVIS WITHOUT CONTRAST TECHNIQUE: Multidetector CT imaging of the abdomen and pelvis was performed following the standard protocol without IV contrast. COMPARISON:  None. FINDINGS: Lower chest: Normal heart size. Dependent atelectasis within the  bilateral lower lobes. Hepatobiliary: Liver is normal in size and contour. Gallbladder is unremarkable. Pancreas: Unremarkable Spleen: Unremarkable Adrenals/Urinary Tract: The adrenal glands are normal. Bilateral renal stones, right-greater-than-left, including a 9 mm stone inferior pole right kidney. Punctate stone identified within the inferior pole of the left kidney. There is a nonobstructing 8 mm stone within the mid aspect of the right kidney (image 45; series 2). Mild left pelviectasis. Urinary bladder is unremarkable. Stomach/Bowel: Small hiatal hernia. Normal morphology of the stomach. No evidence for bowel obstruction. No free fluid or free intraperitoneal air. Normal appendix. Vascular/Lymphatic: Normal caliber abdominal aorta. There is a 6.7 x 5.1 cm soft tissue mass within the left periaortic location (image 35; series 2). Anteriorly there is an additional 3.6 x 3.3 cm left periaortic soft tissue mass (image 44; series 2). Reproductive: Mildly enlarged prostate. Other: Fat containing left inguinal hernia. Musculoskeletal: There is a 5.6 x 3.9 cm soft tissue mass interspersed between the anterior right sixth and seventh ribs, incompletely included. Patchy lucencies demonstrated within the T10 vertebral body extending to the right posterior elements (image 96; series 6). Mild superior endplate compression deformity at this level. There is a soft tissue mass involving the right posterior elements at the T12 vertebral level which measures  3.9 x 3.6 cm with associated osseous destruction. Soft tissue mass extends into the canal at this level (image 21; series 2). Small oval lucent lesion within the posterior L1 vertebral body. Patchy lucency throughout the L3 vertebral body which is collapsed involving both the superior and inferior endplates. Soft tissue mass extends into the left aspect of the posterior elements as well as into the spinal canal with associated marked narrowing. Soft tissue mass at this  location measures up to 4 cm. Patchy lucencies demonstrated within the pubic symphysis bilaterally (image 84; series 2). Lucent lesion within the right ilium with associated soft tissue mass (image 58; series 2). IMPRESSION: Multiple aggressive osseous lesions with associated soft tissue masses demonstrated within the lower thoracic and lumbar spine as well as pelvis and right anterior chest wall. Additionally there are two large retroperitoneal soft tissue masses within the left periaortic location. Overall findings are concerning for metastatic disease or myeloma. Osseous destruction and soft tissue mass involving the L3 vertebral body with approximately 75% collapse at this level. The mass extends to the posterior elements and into the spinal canal were there is marked of narrowing. Osseous destruction of the posterior elements at the T12 vertebral level with extension of the soft tissue mass into the canal at this level. Aggressive osseous lesion involving the posterior T10 vertebral body extending to the posterior elements. Given these extensive findings involving the spine and spinal canal, consider further evaluation with MRI of the thoracic and lumbar spine. Mild left pelviectasis, potentially secondary to mass effect from the large adjacent retroperitoneal mass. Bilateral nephrolithiasis. Nonobstructing 8 mm stone within the mid right ureter. These results were called by telephone at the time of interpretation on 06/28/2017 at 11:24 am to Dr. Nanda Quinton , who verbally acknowledged these results. Electronically Signed   By: Lovey Newcomer M.D.   On: 06/28/2017 11:30   Dg Hip Unilat W Or Wo Pelvis 2-3 Views Left  Result Date: 06/28/2017 CLINICAL DATA:  Worsening low back and left hip pain. EXAM: DG HIP (WITH OR WITHOUT PELVIS) 2-3V LEFT COMPARISON:  None. FINDINGS: Bones of the pelvis appear normal. Sacroiliac joints and symphysis pubis appear normal. Both hip joints appear normal. No evidence of  osteoarthritis, avascular necrosis or other finding. Phleboliths are noted in the pelvis and lower abdomen. IMPRESSION: Negative.  No abnormality seen to explain hip pain. Electronically Signed   By: Nelson Chimes M.D.   On: 06/28/2017 09:59    ____________________________________________   PROCEDURES  Procedure(s) performed:   Procedures  EMERGENCY DEPARTMENT ULTRASOUND  Study: Limited Retroperitoneal Ultrasound of the Abdominal Aorta.  INDICATIONS:Back pain and Age>55 Multiple views of the abdominal aorta were obtained in real-time from the diaphragmatic hiatus to the aortic bifurcation in transverse planes with a multi-frequency probe. PERFORMED BY: Myself FINDINGS: Maximum aortic dimensions are 2.1 cm but a periaortic mass is visualized as well. No color flow. Not consistent with AAA.  LIMITATIONS:  None INTERPRETATION:  No abdominal aortic aneurysm and but peri-aortic mass appreciated.   CPT Code: 343 [id number] [id number] (limited retroperitoneal)  ____________________________________________   INITIAL IMPRESSION / ASSESSMENT AND PLAN / ED COURSE  Pertinent labs & imaging results that were available during my care of the patient were reviewed by me and considered in my medical decision making (see chart for details).  Patient resents to the emergency department for evaluation of left buttock/thigh/knee pain that returned after completing medications for sciatica. The patient was evaluated in the emergency department several days ago and has  a follow-up appointment with neurosurgery in the next 2 weeks. No fevers or chills. No red flag signs or symptoms to suggest spinal cord emergency. Very low suspicion for vascular etiology but plan to obtain bedside ultrasound of the aorta. Patient does have some mild diaphoresis of unclear etiology. Could be pain related but pain is relatively well controlled at this time. Plan to obtain x-rays of the left hip and knee with significant pain in these areas. No  evidence to suggest septic joint. Plan for PO pain medication.   11:47 AM Peri-aortic mass appreciated on bedside US. CT ordered. Radiology called to discuss multiple retroperitoneal and osseous lesions. I suspect that this is what causing the patient's discomfort. He does have some involvement of the spine and posterior column. Some concern for pathologic fracture. Patient could be close to unstable spine. Plan for MRI. Plan for admission for pain control and further workup. Patient has no primary care physician.   12:30 PM Spoke with Dr. Jana Hakim who recommends following MRI, adding tumor markers, and advises that patient would stay at [facility] for radiation. They will see in consultation.   01:01 PM Discussed patient's case with Hospitalist, Dr. Shanon Brow. Patient and family (if present) updated with plan. Care transferred to Hospitalist service.  I reviewed all nursing notes, vitals, pertinent old records, EKGs, labs, imaging (as available).  ____________________________________________  FINAL CLINICAL IMPRESSION(S) / ED DIAGNOSES  Final diagnoses:  Back pain  Retroperitoneal mass  Mass of spine     MEDICATIONS GIVEN DURING THIS VISIT:  Medications  sodium chloride flush (NS) 0.9 % injection 3 mL (not administered)  sodium chloride flush (NS) 0.9 % injection 3 mL (not administered)  0.9 %  sodium chloride infusion (not administered)  ketorolac (TORADOL) 30 MG/ML injection 30 mg (not administered)  ondansetron (ZOFRAN) tablet 4 mg (not administered)    Or  ondansetron (ZOFRAN) injection 4 mg (not administered)  oxyCODONE (Oxy IR/ROXICODONE) immediate release tablet 5 mg (not administered)  docusate sodium (COLACE) capsule 100 mg (not administered)  oxyCODONE-acetaminophen (PERCOCET/ROXICET) 5-325 MG per tablet 1 tablet (1 tablet Oral Given 06/28/17 0936)  morphine 2 MG/ML injection 4 mg (4 mg Intravenous Given 06/28/17 1222)  gadobenate dimeglumine (MULTIHANCE) injection 20 mL (20 mLs  Intravenous Contrast Given 06/28/17 1441)     NEW OUTPATIENT MEDICATIONS STARTED DURING THIS VISIT:  None   Note:  This document was prepared using Dragon voice recognition software and may include unintentional dictation errors.  Nanda Quinton, MD Emergency Medicine    Maan Zarcone, [facility], MD 06/28/17 [id number]

## 2017-06-28 NOTE — Consult Note (Signed)
Asked to review MRI by Dr Jana Hakim  Patient presented to ER earlier today for what sounded like lumbar radiculopathy type symptoms.  Bedside U/S of aorta ordered due to rule out vascular etiology Periaortic mass seen subsequently leading to CT scan showing multiple retroperitoneal and osseous lesions.  Admitted for further work up and evaluation Dr Jana Hakim is currently performing work up for possible lung/prostate primary and have requested IR to help with biopsy of lesion.   MRI LumbarSpine/Thoracic Spine IMPRESSION: 1. Pathologic burst fracture of the L3 vertebral body with 6 mm of retropulsion and approximately 50% height loss, resulting and severe left lateral recess narrowing at L2-L3, moderate spinal canal stenosis directly posterior to the L3 vertebral body and moderate left L3-4 neural foraminal stenosis. 2. 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 Neural foraminal invasion at T7-T8, T11-T12 and T12-L1. 3. Multiple retroperitoneal soft tissue masses.  Per Dr Gwenlyn Perking, patient is neurologically intact. After review of MRI and discussing with Dr Kathyrn Sheriff, patient would be best suited with palliative radiation. He should be placed in TLSO brace due to pathological burst fracture - neuro intact so burst fracture is nonoperative. Please call for any questions.

## 2017-06-28 NOTE — ED Notes (Signed)
Bed: WA20 Expected date:  Expected time:  Means of arrival:  Comments: EMS sciatica, out of pain medication, IV started

## 2017-06-29 ENCOUNTER — Telehealth: Payer: Self-pay | Admitting: Oncology

## 2017-06-29 ENCOUNTER — Encounter (HOSPITAL_COMMUNITY): Payer: Self-pay | Admitting: General Surgery

## 2017-06-29 ENCOUNTER — Ambulatory Visit
Admit: 2017-06-29 | Discharge: 2017-06-29 | Disposition: A | Discharge status: Home / Self Care | Payer: Medicaid Other | Source: Ambulatory Visit | Attending: Radiation Oncology | Admitting: Radiation Oncology

## 2017-06-29 ENCOUNTER — Inpatient Hospital Stay (HOSPITAL_COMMUNITY): Payer: Medicaid Other

## 2017-06-29 DIAGNOSIS — S32030A Wedge compression fracture of third lumbar vertebra, initial encounter for closed fracture: Secondary | ICD-10-CM

## 2017-06-29 DIAGNOSIS — R222 Localized swelling, mass and lump, trunk: Secondary | ICD-10-CM

## 2017-06-29 DIAGNOSIS — R1907 Generalized intra-abdominal and pelvic swelling, mass and lump: Secondary | ICD-10-CM

## 2017-06-29 DIAGNOSIS — C7951 Secondary malignant neoplasm of bone: Secondary | ICD-10-CM | POA: Insufficient documentation

## 2017-06-29 LAB — BASIC METABOLIC PANEL
Anion gap: 9 (ref 5–15)
BUN: 20 mg/dL (ref 6–20)
CO2: 27 mmol/L (ref 22–32)
CREATININE: 0.7 mg/dL (ref 0.61–1.24)
Calcium: 10 mg/dL (ref 8.9–10.3)
Chloride: 103 mmol/L (ref 101–111)
GFR calc Af Amer: >60 mL/min (ref >60)
Glucose, Bld: 130 mg/dL — ABNORMAL HIGH (ref 65–99)
Potassium: 4.1 mmol/L (ref 3.5–5.1)
SODIUM: 139 mmol/L (ref 135–145)

## 2017-06-29 LAB — CBC
HCT: 40.4 % (ref 39.0–52.0)
Hemoglobin: 13.7 g/dL (ref 13.0–17.0)
MCH: 25.5 pg — ABNORMAL LOW (ref 26.0–34.0)
MCHC: 33.9 g/dL (ref 30.0–36.0)
MCV: 75.1 fL — ABNORMAL LOW (ref 78.0–100.0)
PLATELETS: 354 10*3/uL (ref 150–400)
RBC: 5.38 MIL/uL (ref 4.22–5.81)
RDW: 15.1 % (ref 11.5–15.5)
WBC: 6.7 10*3/uL (ref 4.0–10.5)

## 2017-06-29 LAB — PROTIME-INR
INR: 1.06
PROTHROMBIN TIME: 13.8 s (ref 11.4–15.2)

## 2017-06-29 LAB — PSA: Prostatic Specific Antigen: 3.23 ng/mL (ref 0.00–4.00)

## 2017-06-29 LAB — BETA 2 MICROGLOBULIN, SERUM: Beta-2 Microglobulin: 2.2 mg/L (ref 0.6–2.4)

## 2017-06-29 LAB — HIV ANTIBODY (ROUTINE TESTING W REFLEX): HIV SCREEN 4TH GENERATION: NONREACTIVE

## 2017-06-29 LAB — CANCER ANTIGEN 19-9: CAN 19-9: 23 U/mL (ref 0–35)

## 2017-06-29 LAB — CEA: CEA: 0.7 ng/mL (ref 0.0–4.7)

## 2017-06-29 MED ORDER — FLUMAZENIL 0.5 MG/5ML IV SOLN
INTRAVENOUS | Status: AC
  Filled 2017-06-29: qty 5

## 2017-06-29 MED ORDER — FENTANYL CITRATE (PF) 100 MCG/2ML IJ SOLN
INTRAMUSCULAR | Status: AC | PRN
  Administered 2017-06-29 (×2): 50 ug via INTRAVENOUS

## 2017-06-29 MED ORDER — FENTANYL CITRATE (PF) 100 MCG/2ML IJ SOLN
INTRAMUSCULAR | Status: AC
Start: 2017-06-29 — End: 2017-06-30
  Filled 2017-06-29: qty 4

## 2017-06-29 MED ORDER — MIDAZOLAM HCL 2 MG/2ML IJ SOLN
INTRAMUSCULAR | Status: AC
  Filled 2017-06-29: qty 4

## 2017-06-29 MED ORDER — MIDAZOLAM HCL 2 MG/2ML IJ SOLN
INTRAMUSCULAR | Status: AC | PRN
  Administered 2017-06-29: 1 mg via INTRAVENOUS

## 2017-06-29 MED ORDER — PANTOPRAZOLE SODIUM 40 MG PO TBEC
40.0000 mg | DELAYED_RELEASE_TABLET | Freq: Every day | ORAL | Status: DC
  Administered 2017-06-29 – 2017-07-03 (×5): 40 mg via ORAL
  Filled 2017-06-29 (×5): qty 1

## 2017-06-29 MED ORDER — NALOXONE HCL 0.4 MG/ML IJ SOLN
INTRAMUSCULAR | Status: AC
  Filled 2017-06-29: qty 1

## 2017-06-29 MED ORDER — LORAZEPAM 2 MG/ML IJ SOLN: INTRAMUSCULAR | Status: AC | PRN

## 2017-06-29 MED ORDER — KETOROLAC TROMETHAMINE 30 MG/ML IJ SOLN
15.0000 mg | Freq: Four times a day (QID) | INTRAMUSCULAR | Status: AC | PRN
  Administered 2017-06-29 – 2017-07-03 (×6): 15 mg via INTRAVENOUS
  Filled 2017-06-29 (×6): qty 1

## 2017-06-29 NOTE — Progress Notes (Addendum)
PROGRESS NOTE    Cory Ibarra  VZD:[id number] DOB: [birth year] DOA: 06/28/2017 PCP: Patient, No Pcp Per  Brief Narrative:Cory Ibarra is a 56 y.o. male with medical history significant of progressive worsening back/L leg pain and loss of appetite for about 6 weeks now.    About 6 weeks ago he started with vague abdominal discomfort foll by pain in the left hip and left knee pain.  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, involving thoracic spine -no s/s of cord compromise at this time -has some radiculopathy related to L3 burst fracture -IR consult for biopsy today, chest wall lesion planned -Appreciate Dr.Magrinat's help, NSG recommended Rad Onc consult for XRT -continue IV Decadron -will consult PT -pain control, supportive care -PSA, CEA and Ca19-9 are normal  DVT prophylaxis: SCDs for biopsy today Code Status: Full Code Family Communication: None at bedside Disposition Plan: Home pending stabilization, and above plans  Consultants:   Onc  NSG  IR    Subjective: Still with leg/back pain but improving  Objective: Vitals:   06/28/17 1206 06/28/17 1500 06/28/17 2119 06/29/17 0542  BP: (!) 149/95 (!) 124/53 117/62 (!) 142/77  Pulse: (!) 102 (!) 106 (!) 112 97  Resp: 12 13 17 15   Temp: 97.9 F (36.6 C) 97.7 F (36.5 C) 97.6 F (36.4 C) 98.2 F (36.8 C)  TempSrc: Oral Oral Oral Oral  SpO2: 99% 97% 98% 98%  Weight:  91.9 kg (202 lb 8 oz)    Height:  6\' 2"  (1.88 m)      Intake/Output Summary (Last 24 hours) at 06/29/17 1131 Last data filed at 06/29/17 0932  Gross per 24 hour  Intake                0 ml  Output                0 ml   Net                0 ml   Filed Weights   06/28/17 1500  Weight: 91.9 kg (202 lb 8 oz)    Examination:  General exam: Appears calm and comfortable  Respiratory system: Clear to auscultation. Respiratory effort normal. Cardiovascular system: S1 & S2 heard, RRR. No JVD, murmurs, rubs, gallops or clicks. No pedal edema. Gastrointestinal system: Abdomen is nondistended, soft and nontender. Normal bowel sounds heard. Central nervous system: Alert and oriented. strength 5/5, sensory diminished over L knee Extremities: Symmetric 5 x 5 power. Skin: No rashes, lesions or ulcers Psychiatry: Judgement and insight appear normal. Mood & affect appropriate.     Data Reviewed:   CBC:  Recent Labs Lab 06/28/17 0919 06/29/17 0351  WBC 7.8 6.7  NEUTROABS 5.7  --   HGB 14.1 13.7  HCT 40.7 40.4  MCV 75.1* 75.1*  PLT 332 295   Basic Metabolic Panel:  Recent Labs Lab 06/28/17 0919 06/29/17 0351  NA 138 139  K 4.1 4.1  CL 104 103  CO2 23 27  GLUCOSE 108* 130*  BUN 14 20  CREATININE 0.84 0.70  CALCIUM 9.1 10.0   GFR: Estimated Creatinine Clearance: 117 mL/min (by C-G formula based on SCr of 0.7  mg/dL). Liver Function Tests:  Recent Labs Lab 06/28/17 0919  AST 45*  ALT 30  ALKPHOS 101  BILITOT 1.6*  PROT 7.6  ALBUMIN 3.9   No results for input(s): LIPASE, AMYLASE in the last 168 hours. No results for input(s): AMMONIA in the last 168 hours. Coagulation Profile:  Recent Labs Lab 06/29/17 0847  INR 1.06   Cardiac Enzymes: No results for input(s): CKTOTAL, CKMB, CKMBINDEX, TROPONINI in the last 168 hours. BNP (last 3 results) No results for input(s): PROBNP in the last 8760 hours. HbA1C: No results for input(s): HGBA1C in the last 72 hours. CBG: No results for input(s): GLUCAP in the last 168 hours. Lipid Profile: No results for input(s): CHOL, HDL, LDLCALC, TRIG, CHOLHDL, LDLDIRECT in the last 72 hours. Thyroid Function Tests: No results for input(s): TSH,  T4TOTAL, FREET4, T3FREE, THYROIDAB in the last 72 hours. Anemia Panel: No results for input(s): VITAMINB12, FOLATE, FERRITIN, TIBC, IRON, RETICCTPCT in the last 72 hours. Urine analysis:    Component Value Date/Time   COLORURINE YELLOW 06/28/2017 [place] 06/28/2017 0937   LABSPEC 1.020 06/28/2017 0937   PHURINE 5.0 06/28/2017 0937   GLUCOSEU NEGATIVE 06/28/2017 0937   HGBUR SMALL (A) 06/28/2017 0937   BILIRUBINUR NEGATIVE 06/28/2017 0937   KETONESUR NEGATIVE 06/28/2017 0937   PROTEINUR NEGATIVE 06/28/2017 0937   NITRITE NEGATIVE 06/28/2017 0937   LEUKOCYTESUR NEGATIVE 06/28/2017 0937   Sepsis Labs: @LABRCNTIP (procalcitonin:4,lacticidven:4)  )No results found for this or any previous visit (from the past 240 hour(s)).       Radiology Studies: Dg Knee 2 Views Left  Result Date: 06/28/2017 CLINICAL DATA:  Left hip and leg pain which is worsening. EXAM: LEFT KNEE - 1-2 VIEW COMPARISON:  None. FINDINGS: No joint effusion. No joint space narrowing. No significant osteophytes. No sign of avascular necrosis. Benign sclerotic focus in the posterior aspect of the proximal tibia, not significant. IMPRESSION: Negative.  No cause of pain identified. Electronically Signed   By: Nelson Chimes M.D.   On: 06/28/2017 09:59   Mr Thoracic Spine W Wo Contrast  Result Date: 06/28/2017 CLINICAL DATA:  multiple osseous lesions.  Possible myeloma. EXAM: MRI THORACIC AND LUMBAR SPINE WITHOUT AND WITH CONTRAST TECHNIQUE: Multiplanar and multiecho pulse sequences of the thoracic and lumbar spine were obtained without and with intravenous contrast. CONTRAST:  62mL MULTIHANCE GADOBENATE DIMEGLUMINE 529 MG/ML IV SOLN COMPARISON:  CT abdomen pelvis 06/28/2017 FINDINGS: MRI THORACIC SPINE FINDINGS Alignment:  Normal Vertebrae: There are multiple osseous lesions, as seen on the concomitant CT: -- T2, left transverse process -- T6, inferior vertebral body, small lesion -- T7, large lesion involving  most of the vertebral body -- T8, large lesion of the left pedicle extending into the left T7-T8 foramen and involving the most proximal aspect of the adjacent rib -- T10, large lesion involving most of the vertebral body. Mild superior endplate compression. --T12, large lesion centered within the right pedicle, involving the posterior vertebral body and lateral aspect of the right lamina, as well as the adjacent proximal rib. This mass also invades the T11-12 and T12-L1 neural foramina. Cord:  Normal signal and morphology. Paraspinal and other soft tissues: No visible paraspinal or retroperitoneal abnormality. Please see dedicated report for concomitant CT of the abdomen and pelvis. Disc levels: T7-T8: The left neural foramen is severely narrowed by the adjacent soft tissue mass. No central spinal canal or right neural foraminal stenosis. T11-T12: Mild narrowing of the right neural foramen secondary to adjacent soft  tissue mass. MRI LUMBAR SPINE FINDINGS Segmentation:  Normal Alignment:  Normal Vertebrae: There is a a large lesion of the L3 vertebral body with an associated burst fracture with approximately 50% height loss and retropulsion measuring 6 mm. There is a small lesion of the right L4 pedicle. Conus medullaris: Extends to the lower L1 level and appears normal. Paraspinal and other soft tissues: Left prevertebral mass at the L1 level measures 3.1 x 1.6 x 3.9 cm. Large para- aortic mass at the level of the left renal vessels measures 5.8 x 4.0 x 9.4 cm. Disc levels: T12-L1: There is mild right foraminal stenosis due to invasion by the right pedicular soft tissue mass. L1-L2: Normal disc space and facets. No spinal canal or neuroforaminal stenosis. L2-L3: At the inferior aspect of the disc space, there is severe narrowing of the left lateral recess due to retropulsion of the superior endplate of L3. This displaces the descending nerve root. The soft tissue component of the L3 mass is in close proximity to  the exiting left L2 root. At the mid L3 level, there is moderate spinal canal stenosis. L3-L4: Moderate left neural foraminal stenosis secondary to invasion by L3 mass. No spinal canal stenosis. L4-L5: Small central disc protrusion without stenosis. L5-S1: Normal disc space and facets. No spinal canal or neuroforaminal stenosis. Visualized sacrum: Lesion within the right sacral ala measures up to 3.4 cm. IMPRESSION: 1. Pathologic burst fracture of the L3 vertebral body with 6 mm of retropulsion and approximately 50% height loss, resulting and severe left lateral recess narrowing at L2-L3, moderate spinal canal stenosis directly posterior to the L3 vertebral body and moderate left L3-4 neural foraminal stenosis. 2. 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 Neural foraminal invasion at T7-T8, T11-T12 and T12-L1. 3. Multiple retroperitoneal soft tissue masses. Electronically Signed   By: Ulyses Jarred M.D.   On: 06/28/2017 15:37   Mr Lumbar Spine W Wo Contrast  Result Date: 06/28/2017 CLINICAL DATA:  multiple osseous lesions.  Possible myeloma. EXAM: MRI THORACIC AND LUMBAR SPINE WITHOUT AND WITH CONTRAST TECHNIQUE: Multiplanar and multiecho pulse sequences of the thoracic and lumbar spine were obtained without and with intravenous contrast. CONTRAST:  69mL MULTIHANCE GADOBENATE DIMEGLUMINE 529 MG/ML IV SOLN COMPARISON:  CT abdomen pelvis 06/28/2017 FINDINGS: MRI THORACIC SPINE FINDINGS Alignment:  Normal Vertebrae: There are multiple osseous lesions, as seen on the concomitant CT: -- T2, left transverse process -- T6, inferior vertebral body, small lesion -- T7, large lesion involving most of the vertebral body -- T8, large lesion of the left pedicle extending into the left T7-T8 foramen and involving the most proximal aspect of the adjacent rib -- T10, large lesion involving most of the vertebral body. Mild superior endplate  compression. --T12, large lesion centered within the right pedicle, involving the posterior vertebral body and lateral aspect of the right lamina, as well as the adjacent proximal rib. This mass also invades the T11-12 and T12-L1 neural foramina. Cord:  Normal signal and morphology. Paraspinal and other soft tissues: No visible paraspinal or retroperitoneal abnormality. Please see dedicated report for concomitant CT of the abdomen and pelvis. Disc levels: T7-T8: The left neural foramen is severely narrowed by the adjacent soft tissue mass. No central spinal canal or right neural foraminal stenosis. T11-T12: Mild narrowing of the right neural foramen secondary to adjacent soft tissue mass. MRI LUMBAR SPINE FINDINGS Segmentation:  Normal Alignment:  Normal  Vertebrae: There is a a large lesion of the L3 vertebral body with an associated burst fracture with approximately 50% height loss and retropulsion measuring 6 mm. There is a small lesion of the right L4 pedicle. Conus medullaris: Extends to the lower L1 level and appears normal. Paraspinal and other soft tissues: Left prevertebral mass at the L1 level measures 3.1 x 1.6 x 3.9 cm. Large para- aortic mass at the level of the left renal vessels measures 5.8 x 4.0 x 9.4 cm. Disc levels: T12-L1: There is mild right foraminal stenosis due to invasion by the right pedicular soft tissue mass. L1-L2: Normal disc space and facets. No spinal canal or neuroforaminal stenosis. L2-L3: At the inferior aspect of the disc space, there is severe narrowing of the left lateral recess due to retropulsion of the superior endplate of L3. This displaces the descending nerve root. The soft tissue component of the L3 mass is in close proximity to the exiting left L2 root. At the mid L3 level, there is moderate spinal canal stenosis. L3-L4: Moderate left neural foraminal stenosis secondary to invasion by L3 mass. No spinal canal stenosis. L4-L5: Small central disc protrusion without  stenosis. L5-S1: Normal disc space and facets. No spinal canal or neuroforaminal stenosis. Visualized sacrum: Lesion within the right sacral ala measures up to 3.4 cm. IMPRESSION: 1. Pathologic burst fracture of the L3 vertebral body with 6 mm of retropulsion and approximately 50% height loss, resulting and severe left lateral recess narrowing at L2-L3, moderate spinal canal stenosis directly posterior to the L3 vertebral body and moderate left L3-4 neural foraminal stenosis. 2. 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 Neural foraminal invasion at T7-T8, T11-T12 and T12-L1. 3. Multiple retroperitoneal soft tissue masses. Electronically Signed   By: Ulyses Jarred M.D.   On: 06/28/2017 15:37   Dg Chest Port 1 View  Result Date: 06/28/2017 CLINICAL DATA:  Evaluate for possible metastatic disease. EXAM: PORTABLE CHEST 1 VIEW COMPARISON:  None. FINDINGS: Cardiac shadow is within normal limits. There is a vague somewhat nodular density identified in the mid left lung. This may be related to a rib lesion as seen on recent MRI examination. No other focal abnormality is noted. IMPRESSION: Vague density in the left mid lung which likely represents a rib lesion as seen on recent MRI. CT of the chest is recommended for further evaluation. Electronically Signed   By: Inez Catalina M.D.   On: 06/28/2017 18:47   Ct Renal Stone Study  Result Date: 06/28/2017 CLINICAL DATA:  Patient with left flank pain.  Difficulty standing. EXAM: CT ABDOMEN AND PELVIS WITHOUT CONTRAST TECHNIQUE: Multidetector CT imaging of the abdomen and pelvis was performed following the standard protocol without IV contrast. COMPARISON:  None. FINDINGS: Lower chest: Normal heart size. Dependent atelectasis within the bilateral lower lobes. Hepatobiliary: Liver is normal in size and contour. Gallbladder is unremarkable. Pancreas: Unremarkable Spleen: Unremarkable  Adrenals/Urinary Tract: The adrenal glands are normal. Bilateral renal stones, right-greater-than-left, including a 9 mm stone inferior pole right kidney. Punctate stone identified within the inferior pole of the left kidney. There is a nonobstructing 8 mm stone within the mid aspect of the right kidney (image 45; series 2). Mild left pelviectasis. Urinary bladder is unremarkable. Stomach/Bowel: Small hiatal hernia. Normal morphology of the stomach. No evidence for bowel obstruction. No free fluid or free intraperitoneal air. Normal appendix. Vascular/Lymphatic: Normal caliber abdominal aorta. There is  a 6.7 x 5.1 cm soft tissue mass within the left periaortic location (image 35; series 2). Anteriorly there is an additional 3.6 x 3.3 cm left periaortic soft tissue mass (image 44; series 2). Reproductive: Mildly enlarged prostate. Other: Fat containing left inguinal hernia. Musculoskeletal: There is a 5.6 x 3.9 cm soft tissue mass interspersed between the anterior right sixth and seventh ribs, incompletely included. Patchy lucencies demonstrated within the T10 vertebral body extending to the right posterior elements (image 96; series 6). Mild superior endplate compression deformity at this level. There is a soft tissue mass involving the right posterior elements at the T12 vertebral level which measures 3.9 x 3.6 cm with associated osseous destruction. Soft tissue mass extends into the canal at this level (image 21; series 2). Small oval lucent lesion within the posterior L1 vertebral body. Patchy lucency throughout the L3 vertebral body which is collapsed involving both the superior and inferior endplates. Soft tissue mass extends into the left aspect of the posterior elements as well as into the spinal canal with associated marked narrowing. Soft tissue mass at this location measures up to 4 cm. Patchy lucencies demonstrated within the pubic symphysis bilaterally (image 84; series 2). Lucent lesion within the right  ilium with associated soft tissue mass (image 58; series 2). IMPRESSION: Multiple aggressive osseous lesions with associated soft tissue masses demonstrated within the lower thoracic and lumbar spine as well as pelvis and right anterior chest wall. Additionally there are two large retroperitoneal soft tissue masses within the left periaortic location. Overall findings are concerning for metastatic disease or myeloma. Osseous destruction and soft tissue mass involving the L3 vertebral body with approximately 75% collapse at this level. The mass extends to the posterior elements and into the spinal canal were there is marked of narrowing. Osseous destruction of the posterior elements at the T12 vertebral level with extension of the soft tissue mass into the canal at this level. Aggressive osseous lesion involving the posterior T10 vertebral body extending to the posterior elements. Given these extensive findings involving the spine and spinal canal, consider further evaluation with MRI of the thoracic and lumbar spine. Mild left pelviectasis, potentially secondary to mass effect from the large adjacent retroperitoneal mass. Bilateral nephrolithiasis. Nonobstructing 8 mm stone within the mid right ureter. These results were called by telephone at the time of interpretation on 06/28/2017 at 11:24 am to Dr. Nanda Quinton , who verbally acknowledged these results. Electronically Signed   By: Lovey Newcomer M.D.   On: 06/28/2017 11:30   Dg Hip Unilat W Or Wo Pelvis 2-3 Views Left  Result Date: 06/28/2017 CLINICAL DATA:  Worsening low back and left hip pain. EXAM: DG HIP (WITH OR WITHOUT PELVIS) 2-3V LEFT COMPARISON:  None. FINDINGS: Bones of the pelvis appear normal. Sacroiliac joints and symphysis pubis appear normal. Both hip joints appear normal. No evidence of osteoarthritis, avascular necrosis or other finding. Phleboliths are noted in the pelvis and lower abdomen. IMPRESSION: Negative.  No abnormality seen to explain  hip pain. Electronically Signed   By: Nelson Chimes M.D.   On: 06/28/2017 09:59        Scheduled Meds: . dexamethasone  8 mg Intravenous Q12H  . sodium chloride flush  3 mL Intravenous Q12H   Continuous Infusions: . sodium chloride       LOS: 1 day    Time spent: 43min    Domenic Polite, MD [facility] Pager [phone]  If 7PM-7AM, please contact night-coverage [email or web address] Password [facility] 06/29/2017,  11:31 AM

## 2017-06-29 NOTE — Telephone Encounter (Addendum)
Received call from Dr. Broadus Dedric asking if patient will be seen by Radiation.  Called Dr. Sondra Come who said patient needs to be seen today by a Radiation PA.  Threasa Beards, Medical Secretary and Freeman Caldron PA-C notified of inpatient consult.

## 2017-06-29 NOTE — H&P (Signed)
Chief Complaint: chest wall mass  Referring Physician:Dr. Lurline Del  Supervising Physician: Aletta Edouard  Patient Status: [facility] - In-pt  HPI: Cory Ibarra is a 56 y.o. male who was in his normal state of health until several weeks ago.  He initially complained of some abdominal pain/discomfort, but then quickly moved to his back.  He admits to sweats during the day and at night.  About 2 weeks ago he started having severe back that went down his leg and into his knee.  He came to the ED where he was diagnosed with sciatica and given medications.  This helped while he had the medications.  However, once his medications were completed his pain returned and he was unable to move around secondary to his pain.  He then came to the ED where he had an abdominal scan.  This revealed:  Multiple aggressive osseous lesions with associated soft tissue masses demonstrated within the lower thoracic and lumbar spine as well as pelvis and right anterior chest wall. Additionally there are two large retroperitoneal soft tissue masses within the left periaortic location. Overall findings are concerning for metastatic disease or myeloma. Osseous destruction and soft tissue mass involving the L3 vertebral body with approximately 75% collapse at this level. The mass extends to the posterior elements and into the spinal canal were there is marked of narrowing. Osseous destruction of the posterior elements at the T12 vertebral level with extension of the soft tissue mass into the canal at this level. Aggressive osseous lesion involving the posterior T10 vertebral body extending to the posterior elements. Given these extensive findings involving the spine and spinal canal, consider further evaluation with MRI of the thoracic and lumbar spine. Mild left pelviectasis, potentially secondary to mass effect from the large adjacent retroperitoneal mass. Bilateral nephrolithiasis. Nonobstructing 8 mm  stone within the mid right ureter  He then underwent MRI of the thoracic and lumbar spine. This revealed  1. Pathologic burst fracture of the L3 vertebral body with 6 mm of retropulsion and approximately 50% height loss, resulting and severe left lateral recess narrowing at L2-L3, moderate spinal canal stenosis directly posterior to the L3 vertebral body and moderate left L3-4 neural foraminal stenosis. 2. 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 Neural foraminal invasion at T7-T8, T11-T12 and T12-L1. 3. Multiple retroperitoneal soft tissue masses.  He has been admitted and we have been asked to see him for a biopsy to help determine a diagnosis.  Past Medical History:  Past Medical History:  Diagnosis Date  . Tendonitis     Past Surgical History:  Past Surgical History:  Procedure Laterality Date  . HERNIA REPAIR      Family History: History reviewed. No pertinent family history.  Social History:  reports that he has been smoking E-cigarettes.  He has never used smokeless tobacco. He reports that he drinks alcohol. He reports that he does not use drugs.  Allergies:  Allergies  Allergen Reactions  . Bee Venom Swelling    Medications: Medications reviewed in epic  Please HPI for pertinent positives, otherwise complete 10 system ROS negative.  Mallampati Score: MD Evaluation Airway: WNL Heart: WNL Abdomen: WNL Chest/ Lungs: WNL ASA  Classification: 2 Mallampati/Airway Score: One  Physical Exam: BP (!) 142/77 (BP Location: Right Arm) Comment: RN notified  Pulse 97   Temp 98.2 F (36.8 C) (Oral)   Resp 15   Ht  6' 2"  (1.88 m)   Wt 202 lb 8 oz (91.9 kg)   SpO2 98%   BMI 26.00 kg/m  Body mass index is 26 kg/m. General: pleasant, WD, WN white male who is laying in bed in NAD HEENT: head is normocephalic, atraumatic.  Sclera are noninjected.  PERRL.  Ears and nose without any masses  or lesions.  Mouth is pink and moist Heart: regular, rate, and rhythm.  Normal s1,s2. No obvious murmurs, gallops, or rubs noted.  Palpable radial and pedal pulses bilaterally Lungs: CTAB, no wheezes, rhonchi, or rales noted.  Respiratory effort nonlabored.  Possible palpable left and right chest wall lesions Abd: soft, NT, ND, +BS, no masses, hernias, or organomegaly Psych: A&Ox3 with an appropriate affect.   Labs: Results for orders placed or performed during the hospital encounter of 06/28/17 (from the past 48 hour(s))  Comprehensive metabolic panel     Status: Abnormal   Collection Time: 06/28/17  9:19 AM  Result Value Ref Range   Sodium 138 135 - 145 mmol/L   Potassium 4.1 3.5 - 5.1 mmol/L   Chloride 104 101 - 111 mmol/L   CO2 23 22 - 32 mmol/L   Glucose, Bld 108 (H) 65 - 99 mg/dL   BUN 14 6 - 20 mg/dL   Creatinine, Ser 0.84 0.61 - 1.24 mg/dL   Calcium 9.1 8.9 - 10.3 mg/dL   Total Protein 7.6 6.5 - 8.1 g/dL   Albumin 3.9 3.5 - 5.0 g/dL   AST 45 (H) 15 - 41 U/L   ALT 30 17 - 63 U/L   Alkaline Phosphatase 101 38 - 126 U/L   Total Bilirubin 1.6 (H) 0.3 - 1.2 mg/dL   GFR calc non Af Amer >60 >60 mL/min   GFR calc Af Amer >60 >60 mL/min    Comment: (NOTE) The eGFR has been calculated using the CKD EPI equation. This calculation has not been validated in all clinical situations. eGFR's persistently <60 mL/min signify possible Chronic Kidney Disease.    Anion gap 11 5 - 15  CBC with Differential     Status: Abnormal   Collection Time: 06/28/17  9:19 AM  Result Value Ref Range   WBC 7.8 4.0 - 10.5 K/uL   RBC 5.42 4.22 - 5.81 MIL/uL   Hemoglobin 14.1 13.0 - 17.0 g/dL   HCT 40.7 39.0 - 52.0 %   MCV 75.1 (L) 78.0 - 100.0 fL   MCH 26.0 26.0 - 34.0 pg   MCHC 34.6 30.0 - 36.0 g/dL   RDW 14.7 11.5 - 15.5 %   Platelets 332 150 - 400 K/uL   Neutrophils Relative % 73 %   Neutro Abs 5.7 1.7 - 7.7 K/uL   Lymphocytes Relative 13 %   Lymphs Abs 1.0 0.7 - 4.0 K/uL   Monocytes  Relative 13 %   Monocytes Absolute 1.0 0.1 - 1.0 K/uL   Eosinophils Relative 1 %   Eosinophils Absolute 0.1 0.0 - 0.7 K/uL   Basophils Relative 0 %   Basophils Absolute 0.0 0.0 - 0.1 K/uL  Urinalysis, Routine w reflex microscopic     Status: Abnormal   Collection Time: 06/28/17  9:37 AM  Result Value Ref Range   Color, Urine YELLOW YELLOW   APPearance CLEAR CLEAR   Specific Gravity, Urine 1.020 1.005 - 1.030   pH 5.0 5.0 - 8.0   Glucose, UA NEGATIVE NEGATIVE mg/dL   Hgb urine dipstick SMALL (A) NEGATIVE   Bilirubin Urine NEGATIVE NEGATIVE  Ketones, ur NEGATIVE NEGATIVE mg/dL   Protein, ur NEGATIVE NEGATIVE mg/dL   Nitrite NEGATIVE NEGATIVE   Leukocytes, UA NEGATIVE NEGATIVE   RBC / HPF 6-30 0 - 5 RBC/hpf   WBC, UA 6-30 0 - 5 WBC/hpf   Bacteria, UA RARE (A) NONE SEEN   Squamous Epithelial / LPF 0-5 (A) NONE SEEN   Mucous PRESENT   CEA     Status: None   Collection Time: 06/28/17 12:35 PM  Result Value Ref Range   CEA 0.7 0.0 - 4.7 ng/mL    Comment: (NOTE)       Roche ECLIA methodology       Nonsmokers  <3.9                                     Smokers     <5.6 Performed At: [facility] [place], [place] [id number] Lindon Romp MD [id number]   Lactate dehydrogenase     Status: Abnormal   Collection Time: 06/28/17 12:35 PM  Result Value Ref Range   LDH 461 (H) 98 - 192 U/L  Cancer antigen 19-9     Status: None   Collection Time: 06/28/17 12:35 PM  Result Value Ref Range   CA 19-9 23 0 - 35 U/mL    Comment: (NOTE) Roche ECLIA methodology Performed At: [facility] [address] [place], [place] [id number] Lindon Romp MD [id number]   Beta 2 microglobulin, serum     Status: None   Collection Time: 06/28/17 12:35 PM  Result Value Ref Range   Beta-2 Microglobulin 2.2 0.6 - 2.4 mg/L    Comment: (NOTE) Siemens Immulite 2000 Immunochemiluminometric assay [facility]) Performed At: [facility] [place],  [place] [id number] Lindon Romp MD [id number]   Basic metabolic panel     Status: Abnormal   Collection Time: 06/29/17  3:51 AM  Result Value Ref Range   Sodium 139 135 - 145 mmol/L   Potassium 4.1 3.5 - 5.1 mmol/L   Chloride 103 101 - 111 mmol/L   CO2 27 22 - 32 mmol/L   Glucose, Bld 130 (H) 65 - 99 mg/dL   BUN 20 6 - 20 mg/dL   Creatinine, Ser 0.70 0.61 - 1.24 mg/dL   Calcium 10.0 8.9 - 10.3 mg/dL   GFR calc non Af Amer >60 >60 mL/min   GFR calc Af Amer >60 >60 mL/min    Comment: (NOTE) The eGFR has been calculated using the CKD EPI equation. This calculation has not been validated in all clinical situations. eGFR's persistently <60 mL/min signify possible Chronic Kidney Disease.    Anion gap 9 5 - 15  CBC     Status: Abnormal   Collection Time: 06/29/17  3:51 AM  Result Value Ref Range   WBC 6.7 4.0 - 10.5 K/uL   RBC 5.38 4.22 - 5.81 MIL/uL   Hemoglobin 13.7 13.0 - 17.0 g/dL   HCT 40.4 39.0 - 52.0 %   MCV 75.1 (L) 78.0 - 100.0 fL   MCH 25.5 (L) 26.0 - 34.0 pg   MCHC 33.9 30.0 - 36.0 g/dL   RDW 15.1 11.5 - 15.5 %   Platelets 354 150 - 400 K/uL  PSA     Status: None   Collection Time: 06/29/17  3:51 AM  Result Value Ref Range   Prostatic Specific Antigen 3.23 0.00 - 4.00 ng/mL  Comment: (NOTE) While PSA levels of <=4.0 ng/ml are reported as reference range, some men with levels below 4.0 ng/ml can have prostate cancer and many men with PSA above 4.0 ng/ml do not have prostate cancer.  Other tests such as free PSA, age specific reference ranges, PSA velocity and PSA doubling time may be helpful especially in men less than 29 years old. Performed at [facility], [place] [address] [place], [place] [address]   Protime-INR     Status: None   Collection Time: 06/29/17  8:47 AM  Result Value Ref Range   Prothrombin Time 13.8 11.4 - 15.2 seconds   INR 1.06     Imaging: Dg Knee 2 Views Left  Result Date: 06/28/2017 CLINICAL DATA:  Left hip and leg pain  which is worsening. EXAM: LEFT KNEE - 1-2 VIEW COMPARISON:  None. FINDINGS: No joint effusion. No joint space narrowing. No significant osteophytes. No sign of avascular necrosis. Benign sclerotic focus in the posterior aspect of the proximal tibia, not significant. IMPRESSION: Negative.  No cause of pain identified. Electronically Signed   By: Nelson Chimes M.D.   On: 06/28/2017 09:59   Mr Thoracic Spine W Wo Contrast  Result Date: 06/28/2017 CLINICAL DATA:  multiple osseous lesions.  Possible myeloma. EXAM: MRI THORACIC AND LUMBAR SPINE WITHOUT AND WITH CONTRAST TECHNIQUE: Multiplanar and multiecho pulse sequences of the thoracic and lumbar spine were obtained without and with intravenous contrast. CONTRAST:  90m MULTIHANCE GADOBENATE DIMEGLUMINE 529 MG/ML IV SOLN COMPARISON:  CT abdomen pelvis 06/28/2017 FINDINGS: MRI THORACIC SPINE FINDINGS Alignment:  Normal Vertebrae: There are multiple osseous lesions, as seen on the concomitant CT: -- T2, left transverse process -- T6, inferior vertebral body, small lesion -- T7, large lesion involving most of the vertebral body -- T8, large lesion of the left pedicle extending into the left T7-T8 foramen and involving the most proximal aspect of the adjacent rib -- T10, large lesion involving most of the vertebral body. Mild superior endplate compression. --T12, large lesion centered within the right pedicle, involving the posterior vertebral body and lateral aspect of the right lamina, as well as the adjacent proximal rib. This mass also invades the T11-12 and T12-L1 neural foramina. Cord:  Normal signal and morphology. Paraspinal and other soft tissues: No visible paraspinal or retroperitoneal abnormality. Please see dedicated report for concomitant CT of the abdomen and pelvis. Disc levels: T7-T8: The left neural foramen is severely narrowed by the adjacent soft tissue mass. No central spinal canal or right neural foraminal stenosis. T11-T12: Mild narrowing of the  right neural foramen secondary to adjacent soft tissue mass. MRI LUMBAR SPINE FINDINGS Segmentation:  Normal Alignment:  Normal Vertebrae: There is a a large lesion of the L3 vertebral body with an associated burst fracture with approximately 50% height loss and retropulsion measuring 6 mm. There is a small lesion of the right L4 pedicle. Conus medullaris: Extends to the lower L1 level and appears normal. Paraspinal and other soft tissues: Left prevertebral mass at the L1 level measures 3.1 x 1.6 x 3.9 cm. Large para- aortic mass at the level of the left renal vessels measures 5.8 x 4.0 x 9.4 cm. Disc levels: T12-L1: There is mild right foraminal stenosis due to invasion by the right pedicular soft tissue mass. L1-L2: Normal disc space and facets. No spinal canal or neuroforaminal stenosis. L2-L3: At the inferior aspect of the disc space, there is severe narrowing of the left lateral recess due to retropulsion of  the superior endplate of L3. This displaces the descending nerve root. The soft tissue component of the L3 mass is in close proximity to the exiting left L2 root. At the mid L3 level, there is moderate spinal canal stenosis. L3-L4: Moderate left neural foraminal stenosis secondary to invasion by L3 mass. No spinal canal stenosis. L4-L5: Small central disc protrusion without stenosis. L5-S1: Normal disc space and facets. No spinal canal or neuroforaminal stenosis. Visualized sacrum: Lesion within the right sacral ala measures up to 3.4 cm. IMPRESSION: 1. Pathologic burst fracture of the L3 vertebral body with 6 mm of retropulsion and approximately 50% height loss, resulting and severe left lateral recess narrowing at L2-L3, moderate spinal canal stenosis directly posterior to the L3 vertebral body and moderate left L3-4 neural foraminal stenosis. 2. 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 Neural foraminal invasion at T7-T8,  T11-T12 and T12-L1. 3. Multiple retroperitoneal soft tissue masses. Electronically Signed   By: Ulyses Jarred M.D.   On: 06/28/2017 15:37   Mr Lumbar Spine W Wo Contrast  Result Date: 06/28/2017 CLINICAL DATA:  multiple osseous lesions.  Possible myeloma. EXAM: MRI THORACIC AND LUMBAR SPINE WITHOUT AND WITH CONTRAST TECHNIQUE: Multiplanar and multiecho pulse sequences of the thoracic and lumbar spine were obtained without and with intravenous contrast. CONTRAST:  8m MULTIHANCE GADOBENATE DIMEGLUMINE 529 MG/ML IV SOLN COMPARISON:  CT abdomen pelvis 06/28/2017 FINDINGS: MRI THORACIC SPINE FINDINGS Alignment:  Normal Vertebrae: There are multiple osseous lesions, as seen on the concomitant CT: -- T2, left transverse process -- T6, inferior vertebral body, small lesion -- T7, large lesion involving most of the vertebral body -- T8, large lesion of the left pedicle extending into the left T7-T8 foramen and involving the most proximal aspect of the adjacent rib -- T10, large lesion involving most of the vertebral body. Mild superior endplate compression. --T12, large lesion centered within the right pedicle, involving the posterior vertebral body and lateral aspect of the right lamina, as well as the adjacent proximal rib. This mass also invades the T11-12 and T12-L1 neural foramina. Cord:  Normal signal and morphology. Paraspinal and other soft tissues: No visible paraspinal or retroperitoneal abnormality. Please see dedicated report for concomitant CT of the abdomen and pelvis. Disc levels: T7-T8: The left neural foramen is severely narrowed by the adjacent soft tissue mass. No central spinal canal or right neural foraminal stenosis. T11-T12: Mild narrowing of the right neural foramen secondary to adjacent soft tissue mass. MRI LUMBAR SPINE FINDINGS Segmentation:  Normal Alignment:  Normal Vertebrae: There is a a large lesion of the L3 vertebral body with an associated burst fracture with approximately 50% height  loss and retropulsion measuring 6 mm. There is a small lesion of the right L4 pedicle. Conus medullaris: Extends to the lower L1 level and appears normal. Paraspinal and other soft tissues: Left prevertebral mass at the L1 level measures 3.1 x 1.6 x 3.9 cm. Large para- aortic mass at the level of the left renal vessels measures 5.8 x 4.0 x 9.4 cm. Disc levels: T12-L1: There is mild right foraminal stenosis due to invasion by the right pedicular soft tissue mass. L1-L2: Normal disc space and facets. No spinal canal or neuroforaminal stenosis. L2-L3: At the inferior aspect of the disc space, there is severe narrowing of the left lateral recess due to retropulsion of the superior endplate of L3. This displaces the descending nerve root. The  soft tissue component of the L3 mass is in close proximity to the exiting left L2 root. At the mid L3 level, there is moderate spinal canal stenosis. L3-L4: Moderate left neural foraminal stenosis secondary to invasion by L3 mass. No spinal canal stenosis. L4-L5: Small central disc protrusion without stenosis. L5-S1: Normal disc space and facets. No spinal canal or neuroforaminal stenosis. Visualized sacrum: Lesion within the right sacral ala measures up to 3.4 cm. IMPRESSION: 1. Pathologic burst fracture of the L3 vertebral body with 6 mm of retropulsion and approximately 50% height loss, resulting and severe left lateral recess narrowing at L2-L3, moderate spinal canal stenosis directly posterior to the L3 vertebral body and moderate left L3-4 neural foraminal stenosis. 2. 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 Neural foraminal invasion at T7-T8, T11-T12 and T12-L1. 3. Multiple retroperitoneal soft tissue masses. Electronically Signed   By: Ulyses Jarred M.D.   On: 06/28/2017 15:37   Dg Chest Port 1 View  Result Date: 06/28/2017 CLINICAL DATA:  Evaluate for possible metastatic disease. EXAM:  PORTABLE CHEST 1 VIEW COMPARISON:  None. FINDINGS: Cardiac shadow is within normal limits. There is a vague somewhat nodular density identified in the mid left lung. This may be related to a rib lesion as seen on recent MRI examination. No other focal abnormality is noted. IMPRESSION: Vague density in the left mid lung which likely represents a rib lesion as seen on recent MRI. CT of the chest is recommended for further evaluation. Electronically Signed   By: Inez Catalina M.D.   On: 06/28/2017 18:47   Ct Renal Stone Study  Result Date: 06/28/2017 CLINICAL DATA:  Patient with left flank pain.  Difficulty standing. EXAM: CT ABDOMEN AND PELVIS WITHOUT CONTRAST TECHNIQUE: Multidetector CT imaging of the abdomen and pelvis was performed following the standard protocol without IV contrast. COMPARISON:  None. FINDINGS: Lower chest: Normal heart size. Dependent atelectasis within the bilateral lower lobes. Hepatobiliary: Liver is normal in size and contour. Gallbladder is unremarkable. Pancreas: Unremarkable Spleen: Unremarkable Adrenals/Urinary Tract: The adrenal glands are normal. Bilateral renal stones, right-greater-than-left, including a 9 mm stone inferior pole right kidney. Punctate stone identified within the inferior pole of the left kidney. There is a nonobstructing 8 mm stone within the mid aspect of the right kidney (image 45; series 2). Mild left pelviectasis. Urinary bladder is unremarkable. Stomach/Bowel: Small hiatal hernia. Normal morphology of the stomach. No evidence for bowel obstruction. No free fluid or free intraperitoneal air. Normal appendix. Vascular/Lymphatic: Normal caliber abdominal aorta. There is a 6.7 x 5.1 cm soft tissue mass within the left periaortic location (image 35; series 2). Anteriorly there is an additional 3.6 x 3.3 cm left periaortic soft tissue mass (image 44; series 2). Reproductive: Mildly enlarged prostate. Other: Fat containing left inguinal hernia. Musculoskeletal: There  is a 5.6 x 3.9 cm soft tissue mass interspersed between the anterior right sixth and seventh ribs, incompletely included. Patchy lucencies demonstrated within the T10 vertebral body extending to the right posterior elements (image 96; series 6). Mild superior endplate compression deformity at this level. There is a soft tissue mass involving the right posterior elements at the T12 vertebral level which measures 3.9 x 3.6 cm with associated osseous destruction. Soft tissue mass extends into the canal at this level (image 21; series 2). Small oval lucent lesion within the posterior L1 vertebral body. Patchy lucency throughout the L3 vertebral body which  is collapsed involving both the superior and inferior endplates. Soft tissue mass extends into the left aspect of the posterior elements as well as into the spinal canal with associated marked narrowing. Soft tissue mass at this location measures up to 4 cm. Patchy lucencies demonstrated within the pubic symphysis bilaterally (image 84; series 2). Lucent lesion within the right ilium with associated soft tissue mass (image 58; series 2). IMPRESSION: Multiple aggressive osseous lesions with associated soft tissue masses demonstrated within the lower thoracic and lumbar spine as well as pelvis and right anterior chest wall. Additionally there are two large retroperitoneal soft tissue masses within the left periaortic location. Overall findings are concerning for metastatic disease or myeloma. Osseous destruction and soft tissue mass involving the L3 vertebral body with approximately 75% collapse at this level. The mass extends to the posterior elements and into the spinal canal were there is marked of narrowing. Osseous destruction of the posterior elements at the T12 vertebral level with extension of the soft tissue mass into the canal at this level. Aggressive osseous lesion involving the posterior T10 vertebral body extending to the posterior elements. Given these  extensive findings involving the spine and spinal canal, consider further evaluation with MRI of the thoracic and lumbar spine. Mild left pelviectasis, potentially secondary to mass effect from the large adjacent retroperitoneal mass. Bilateral nephrolithiasis. Nonobstructing 8 mm stone within the mid right ureter. These results were called by telephone at the time of interpretation on 06/28/2017 at 11:24 am to Dr. Nanda Quinton , who verbally acknowledged these results. Electronically Signed   By: Lovey Newcomer M.D.   On: 06/28/2017 11:30   Dg Hip Unilat W Or Wo Pelvis 2-3 Views Left  Result Date: 06/28/2017 CLINICAL DATA:  Worsening low back and left hip pain. EXAM: DG HIP (WITH OR WITHOUT PELVIS) 2-3V LEFT COMPARISON:  None. FINDINGS: Bones of the pelvis appear normal. Sacroiliac joints and symphysis pubis appear normal. Both hip joints appear normal. No evidence of osteoarthritis, avascular necrosis or other finding. Phleboliths are noted in the pelvis and lower abdomen. IMPRESSION: Negative.  No abnormality seen to explain hip pain. Electronically Signed   By: Nelson Chimes M.D.   On: 06/28/2017 09:59    Assessment/Plan 1. Right chest wall lesion, likely metastatic lesion from unknown primary malignancy  We will plan to pursue a right chest wall lesion biopsy today as this is the safest placed for a biopsy.  His labs and vitals have been reviewed.  He is NPO and not on blood thinners.  Risks and Benefits discussed with the patient including, but not limited to bleeding, infection, damage to adjacent structures or low yield requiring additional tests. All of the patient's questions were answered, patient is agreeable to proceed. Consent signed and in chart.  Thank you for this interesting consult.  I greatly enjoyed meeting Lavere Stork and look forward to participating in their care.  A copy of this report was sent to the requesting provider on this date.  Electronically Signed: Henreitta Cea 06/29/2017, 9:55 AM   I spent a total of 40 Minutes    in face to face in clinical consultation, greater than 50% of which was counseling/coordinating care for right chest wall lesion

## 2017-06-29 NOTE — Procedures (Signed)
Interventional Radiology Procedure Note  Procedure: US guided core biopsy of right chest wall mass  Complications: None  Estimated Blood Loss: < 10 mL  Findings: Right anterior chest wall mass localized and sampled with 18 G core device.  5 samples obtained.  Venetia Night. Kathlene Cote, M.D Pager:  [phone]

## 2017-06-29 NOTE — Consult Note (Addendum)
Radiation Oncology         ([phone]) [phone] ________________________________  Name: Cory Ibarra MRN: [id number]  Date: 06/28/2017  DOB: 1961/02/12  [id number], No Pcp Per  No ref. provider found     REFERRING PHYSICIAN: No ref. provider found   DIAGNOSIS: The primary encounter diagnosis was Retroperitoneal mass. Diagnoses of Back pain, Mass of spine, Generalized abdominal mass, Severe back pain, Weight loss, unintentional, Metastasis (HCC), and Chest wall mass were also pertinent to this visit.   HISTORY OF PRESENT ILLNESS: Cory Ibarra is a 56 y.o. male seen at the request of Dr. Jana Hakim. He initially presented to the emergency room approximately 2 weeks ago with complaints of severe back pain with radiation into the left hip and left leg. Plain films obtained at that time were unremarkable. He was discharged home with steroids and pain medication. He reports significant improvement in the low back and hip pain while taking the medications but the pain returned immediately after he completed the course of steroids. He admits that he has been having intermittent, diffuse pain in the abdomen and low back for the past next weeks but had attributed that to "old age".  He presented back to the emergency room on 06/28/2017 due to severe pain in the low back, left hip and left leg making it impossible for him to stand or walk. A CT renal stone study was obtained for further evaluation and demonstrated multiple soft tissue masses in the lower abdomen and retroperitoneum as well as diffuse osseous lesions.  MRI of the thoracolumbar spine was performed that same day for further evaluation and revealed multiple retroperitoneal soft tissue masses (Left prevertebral mass at the L1 level measures 3.1 x 1.6 x 3.9 cm. Large para- aortic mass at the level of the left renal vessels measures 5.8 x 4.0 x 9.4 cm.), a pathologic burst fracture of the L3 vertebral body with 6 mm of retropulsion as well as multiple other osseous  metastatic lesions throughout the thoracic and lumbar spine with neural foraminal invasion at T7-T8, T11 and T12 and T12-L1.  He has no previous personal history of cancer.  He underwent an ultrasound-guided core biopsy of a chest wall mass earlier today and pathology remains pending.  Radiation oncology has been consultation for consideration of palliative radiotherapy to help control the patient's low back pain.  Of note, he has a family history of prostate cancer in 2 brothers (both still living and doing well post treatment) and lung cancer in his father and 1 brother (both have since passed).  PREVIOUS RADIATION THERAPY: No   PAST MEDICAL HISTORY:  Past Medical History:  Diagnosis Date  . Tendonitis        PAST SURGICAL HISTORY: Past Surgical History:  Procedure Laterality Date  . HERNIA REPAIR       FAMILY HISTORY: History reviewed. No pertinent family history.   SOCIAL HISTORY:  reports that he has been smoking E-cigarettes.  He has never used smokeless tobacco. He reports that he drinks alcohol. He reports that he does not use drugs.   ALLERGIES: Bee venom   MEDICATIONS:  Current Facility-Administered Medications  Medication Dose Route Frequency Provider Last Rate Last Dose  . 0.9 %  sodium chloride infusion  250 mL Intravenous PRN Derrill Kay A, MD      . dexamethasone (DECADRON) injection 8 mg  8 mg Intravenous Q12H Magrinat, Virgie Dad, MD   8 mg at 06/29/17 0951  . docusate sodium (COLACE) capsule 100 mg  100  mg Oral Daily PRN Phillips Grout, MD      . flumazenil (ROMAZICON) 0.5 MG/5ML injection           . ketorolac (TORADOL) 30 MG/ML injection 15 mg  15 mg Intravenous Q6H PRN Domenic Polite, MD      . ondansetron [facility]) tablet 4 mg  4 mg Oral Q6H PRN Phillips Grout, MD       Or  . ondansetron [facility]) injection 4 mg  4 mg Intravenous Q6H PRN Derrill Kay A, MD      . oxyCODONE (Oxy IR/ROXICODONE) immediate release tablet 5 mg  5 mg Oral Q4H PRN Phillips Grout, MD   5 mg at 06/28/17 2220  . pantoprazole (PROTONIX) EC tablet 40 mg  40 mg Oral Q1200 Domenic Polite, MD      . sodium chloride flush (NS) 0.9 % injection 3 mL  3 mL Intravenous Q12H Derrill Kay A, MD   3 mL at 06/29/17 0953  . sodium chloride flush (NS) 0.9 % injection 3 mL  3 mL Intravenous PRN Phillips Grout, MD         REVIEW OF SYSTEMS: On review of systems, the patient reports that he is doing well overall. He denies any chest pain, shortness of breath, cough, fevers, chills, night sweats, unintended weight changes. He denies any bowel or bladder disturbances, and denies abdominal pain, nausea or vomiting. He has not had a bowel movement since Tuesday of this week. He denies any new musculoskeletal or joint aches or pains aside from those mentioned above in the HPI.  He reports intermittent numbness and tingling in the left knee but denies numbness and tingling elsewhere in the lower extremities. A complete review of systems is obtained and is otherwise negative.   PHYSICAL EXAM:  Wt Readings from Last 3 Encounters:  06/28/17 202 lb 8 oz (91.9 kg)  06/18/17 230 lb (104.3 kg)   Temp Readings from Last 3 Encounters:  06/29/17 98.5 F (36.9 C) (Oral)  06/18/17 98 F (36.7 C) (Oral)   BP Readings from Last 3 Encounters:  06/29/17 (!) 152/80  06/18/17 (!) 142/80   Pulse Readings from Last 3 Encounters:  06/29/17 (!) 102  06/18/17 86   Pain Assessment Pain Score: 4 /10  In general this is a well appearing Caucasian male in no acute distress. He is alert and oriented x4 and appropriate throughout the examination. HEENT reveals that the patient is normocephalic, atraumatic. EOMs are intact. PERRLA. Skin is intact without any evidence of gross lesions. Cardiovascular exam reveals a regular rate and rhythm, no clicks rubs or murmurs are auscultated. Chest is clear to auscultation bilaterally. Lymphatic assessment is performed and does not reveal any adenopathy in the  cervical, supraclavicular or axillary chains. Abdomen has active bowel sounds in all quadrants and is intact. The abdomen is soft, non tender, non distended. Lower extremities are negative for pretibial pitting edema, deep calf tenderness, cyanosis or clubbing. Strength is 5/5 and equal bilaterally in the lower extremities.  Sensation is intact to light touch and equal bilaterally in the lower extremities.   ECOG = 3  0 - Asymptomatic (Fully active, able to carry on all predisease activities without restriction)  1 - Symptomatic but completely ambulatory (Restricted in physically strenuous activity but ambulatory and able to carry out work of a light or sedentary nature. For example, light housework, office work)  2 - Symptomatic, <50% in bed during the day (Ambulatory and capable of  all self care but unable to carry out any work activities. Up and about more than 50% of waking hours)  3 - Symptomatic, >50% in bed, but not bedbound (Capable of only limited self-care, confined to bed or chair 50% or more of waking hours)  4 - Bedbound (Completely disabled. Cannot carry on any self-care. Totally confined to bed or chair)  5 - Death   Eustace Pen MM, Creech RH, Tormey DC, et al. [phone]). "Toxicity and response criteria of the [facility] Group". [place] Oncol. 5 (6): 649-55    LABORATORY DATA:  Lab Results  Component Value Date   WBC 6.7 06/29/2017   HGB 13.7 06/29/2017   HCT 40.4 06/29/2017   MCV 75.1 (L) 06/29/2017   PLT 354 06/29/2017   Lab Results  Component Value Date   NA 139 06/29/2017   K 4.1 06/29/2017   CL 103 06/29/2017   CO2 27 06/29/2017   Lab Results  Component Value Date   ALT 30 06/28/2017   AST 45 (H) 06/28/2017   ALKPHOS 101 06/28/2017   BILITOT 1.6 (H) 06/28/2017      RADIOGRAPHY: Dg Lumbar Spine Complete  Result Date: 06/18/2017 CLINICAL DATA:  Back pain. EXAM: LUMBAR SPINE - COMPLETE 4+ VIEW COMPARISON:  None. FINDINGS: There is no  evidence of lumbar spine fracture. Alignment is normal. Intervertebral disc spaces are maintained. There is a calcification noted within the right lower quadrant of the abdomen which may represent an at appendicolith. IMPRESSION: Unremarkable appearance of the lumbar spine. Electronically Signed   By: Kerby Moors M.D.   On: 06/18/2017 09:14   Dg Knee 2 Views Left  Result Date: 06/28/2017 CLINICAL DATA:  Left hip and leg pain which is worsening. EXAM: LEFT KNEE - 1-2 VIEW COMPARISON:  None. FINDINGS: No joint effusion. No joint space narrowing. No significant osteophytes. No sign of avascular necrosis. Benign sclerotic focus in the posterior aspect of the proximal tibia, not significant. IMPRESSION: Negative.  No cause of pain identified. Electronically Signed   By: Nelson Chimes M.D.   On: 06/28/2017 09:59   Mr Thoracic Spine W Wo Contrast  Result Date: 06/28/2017 CLINICAL DATA:  multiple osseous lesions.  Possible myeloma. EXAM: MRI THORACIC AND LUMBAR SPINE WITHOUT AND WITH CONTRAST TECHNIQUE: Multiplanar and multiecho pulse sequences of the thoracic and lumbar spine were obtained without and with intravenous contrast. CONTRAST:  55mL MULTIHANCE GADOBENATE DIMEGLUMINE 529 MG/ML IV SOLN COMPARISON:  CT abdomen pelvis 06/28/2017 FINDINGS: MRI THORACIC SPINE FINDINGS Alignment:  Normal Vertebrae: There are multiple osseous lesions, as seen on the concomitant CT: -- T2, left transverse process -- T6, inferior vertebral body, small lesion -- T7, large lesion involving most of the vertebral body -- T8, large lesion of the left pedicle extending into the left T7-T8 foramen and involving the most proximal aspect of the adjacent rib -- T10, large lesion involving most of the vertebral body. Mild superior endplate compression. --T12, large lesion centered within the right pedicle, involving the posterior vertebral body and lateral aspect of the right lamina, as well as the adjacent proximal rib. This mass also  invades the T11-12 and T12-L1 neural foramina. Cord:  Normal signal and morphology. Paraspinal and other soft tissues: No visible paraspinal or retroperitoneal abnormality. Please see dedicated report for concomitant CT of the abdomen and pelvis. Disc levels: T7-T8: The left neural foramen is severely narrowed by the adjacent soft tissue mass. No central spinal canal or right neural foraminal stenosis. T11-T12: Mild  narrowing of the right neural foramen secondary to adjacent soft tissue mass. MRI LUMBAR SPINE FINDINGS Segmentation:  Normal Alignment:  Normal Vertebrae: There is a a large lesion of the L3 vertebral body with an associated burst fracture with approximately 50% height loss and retropulsion measuring 6 mm. There is a small lesion of the right L4 pedicle. Conus medullaris: Extends to the lower L1 level and appears normal. Paraspinal and other soft tissues: Left prevertebral mass at the L1 level measures 3.1 x 1.6 x 3.9 cm. Large para- aortic mass at the level of the left renal vessels measures 5.8 x 4.0 x 9.4 cm. Disc levels: T12-L1: There is mild right foraminal stenosis due to invasion by the right pedicular soft tissue mass. L1-L2: Normal disc space and facets. No spinal canal or neuroforaminal stenosis. L2-L3: At the inferior aspect of the disc space, there is severe narrowing of the left lateral recess due to retropulsion of the superior endplate of L3. This displaces the descending nerve root. The soft tissue component of the L3 mass is in close proximity to the exiting left L2 root. At the mid L3 level, there is moderate spinal canal stenosis. L3-L4: Moderate left neural foraminal stenosis secondary to invasion by L3 mass. No spinal canal stenosis. L4-L5: Small central disc protrusion without stenosis. L5-S1: Normal disc space and facets. No spinal canal or neuroforaminal stenosis. Visualized sacrum: Lesion within the right sacral ala measures up to 3.4 cm. IMPRESSION: 1. Pathologic burst fracture  of the L3 vertebral body with 6 mm of retropulsion and approximately 50% height loss, resulting and severe left lateral recess narrowing at L2-L3, moderate spinal canal stenosis directly posterior to the L3 vertebral body and moderate left L3-4 neural foraminal stenosis. 2. 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 Neural foraminal invasion at T7-T8, T11-T12 and T12-L1. 3. Multiple retroperitoneal soft tissue masses. Electronically Signed   By: Ulyses Jarred M.D.   On: 06/28/2017 15:37   Mr Lumbar Spine W Wo Contrast  Result Date: 06/28/2017 CLINICAL DATA:  multiple osseous lesions.  Possible myeloma. EXAM: MRI THORACIC AND LUMBAR SPINE WITHOUT AND WITH CONTRAST TECHNIQUE: Multiplanar and multiecho pulse sequences of the thoracic and lumbar spine were obtained without and with intravenous contrast. CONTRAST:  30mL MULTIHANCE GADOBENATE DIMEGLUMINE 529 MG/ML IV SOLN COMPARISON:  CT abdomen pelvis 06/28/2017 FINDINGS: MRI THORACIC SPINE FINDINGS Alignment:  Normal Vertebrae: There are multiple osseous lesions, as seen on the concomitant CT: -- T2, left transverse process -- T6, inferior vertebral body, small lesion -- T7, large lesion involving most of the vertebral body -- T8, large lesion of the left pedicle extending into the left T7-T8 foramen and involving the most proximal aspect of the adjacent rib -- T10, large lesion involving most of the vertebral body. Mild superior endplate compression. --T12, large lesion centered within the right pedicle, involving the posterior vertebral body and lateral aspect of the right lamina, as well as the adjacent proximal rib. This mass also invades the T11-12 and T12-L1 neural foramina. Cord:  Normal signal and morphology. Paraspinal and other soft tissues: No visible paraspinal or retroperitoneal abnormality. Please see dedicated report for concomitant CT of the abdomen and pelvis. Disc levels:  T7-T8: The left neural foramen is severely narrowed by the adjacent soft tissue mass. No central spinal canal or right neural foraminal stenosis. T11-T12: Mild narrowing of the right neural foramen secondary to adjacent soft tissue mass.  MRI LUMBAR SPINE FINDINGS Segmentation:  Normal Alignment:  Normal Vertebrae: There is a a large lesion of the L3 vertebral body with an associated burst fracture with approximately 50% height loss and retropulsion measuring 6 mm. There is a small lesion of the right L4 pedicle. Conus medullaris: Extends to the lower L1 level and appears normal. Paraspinal and other soft tissues: Left prevertebral mass at the L1 level measures 3.1 x 1.6 x 3.9 cm. Large para- aortic mass at the level of the left renal vessels measures 5.8 x 4.0 x 9.4 cm. Disc levels: T12-L1: There is mild right foraminal stenosis due to invasion by the right pedicular soft tissue mass. L1-L2: Normal disc space and facets. No spinal canal or neuroforaminal stenosis. L2-L3: At the inferior aspect of the disc space, there is severe narrowing of the left lateral recess due to retropulsion of the superior endplate of L3. This displaces the descending nerve root. The soft tissue component of the L3 mass is in close proximity to the exiting left L2 root. At the mid L3 level, there is moderate spinal canal stenosis. L3-L4: Moderate left neural foraminal stenosis secondary to invasion by L3 mass. No spinal canal stenosis. L4-L5: Small central disc protrusion without stenosis. L5-S1: Normal disc space and facets. No spinal canal or neuroforaminal stenosis. Visualized sacrum: Lesion within the right sacral ala measures up to 3.4 cm. IMPRESSION: 1. Pathologic burst fracture of the L3 vertebral body with 6 mm of retropulsion and approximately 50% height loss, resulting and severe left lateral recess narrowing at L2-L3, moderate spinal canal stenosis directly posterior to the L3 vertebral body and moderate left L3-4 neural  foraminal stenosis. 2. 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 Neural foraminal invasion at T7-T8, T11-T12 and T12-L1. 3. Multiple retroperitoneal soft tissue masses. Electronically Signed   By: Ulyses Jarred M.D.   On: 06/28/2017 15:37   [place] Core Biopsy  Result Date: 06/29/2017 CLINICAL DATA:  Destructive bone lesions including right anterior chest wall mass. EXAM: ULTRASOUND GUIDED CORE BIOPSY OF CHEST WALL MASS MEDICATIONS: 2.0 mg IV Versed; 100 mcg IV Fentanyl Total Moderate Sedation Time: 10 minutes The patient's level of consciousness and physiologic status were continuously monitored during the procedure by Radiology nursing. PROCEDURE: The procedure, risks, benefits, and alternatives were explained to the patient. Questions regarding the procedure were encouraged and answered. The patient understands and consents to the procedure. A time out was performed prior to initiating the procedure. The right anterior chest wall was prepped with chlorhexidine in a sterile fashion, and a sterile drape was applied covering the operative field. A sterile gown and sterile gloves were used for the procedure. Local anesthesia was provided with 1% Lidocaine. Ultrasound was used to localize a right anterior chest wall mass. A 17 gauge needle was advanced to the medial margin of the mass. Five separate 18 gauge core biopsy samples were then obtained through the lesion. Four samples were submitted in formalin and 1 sample submitted in saline. Post biopsy imaging was performed with ultrasound. COMPLICATIONS: None. FINDINGS: Lobulated and hypoechoic soft tissue mass is present in the right anterior chest wall. This measures at least 6 cm in greatest diameter. Solid tissue was obtained. IMPRESSION: Ultrasound-guided core biopsy performed of a right anterior chest wall mass. Electronically Signed   By: Aletta Edouard M.D.   On: 06/29/2017 16:02    Dg Chest Port 1 View  Result Date:  06/28/2017 CLINICAL DATA:  Evaluate for possible metastatic disease. EXAM: PORTABLE CHEST 1 VIEW COMPARISON:  None. FINDINGS: Cardiac shadow is within normal limits. There is a vague somewhat nodular density identified in the mid left lung. This may be related to a rib lesion as seen on recent MRI examination. No other focal abnormality is noted. IMPRESSION: Vague density in the left mid lung which likely represents a rib lesion as seen on recent MRI. CT of the chest is recommended for further evaluation. Electronically Signed   By: Inez Catalina M.D.   On: 06/28/2017 18:47   Ct Renal Stone Study  Result Date: 06/28/2017 CLINICAL DATA:  Patient with left flank pain.  Difficulty standing. EXAM: CT ABDOMEN AND PELVIS WITHOUT CONTRAST TECHNIQUE: Multidetector CT imaging of the abdomen and pelvis was performed following the standard protocol without IV contrast. COMPARISON:  None. FINDINGS: Lower chest: Normal heart size. Dependent atelectasis within the bilateral lower lobes. Hepatobiliary: Liver is normal in size and contour. Gallbladder is unremarkable. Pancreas: Unremarkable Spleen: Unremarkable Adrenals/Urinary Tract: The adrenal glands are normal. Bilateral renal stones, right-greater-than-left, including a 9 mm stone inferior pole right kidney. Punctate stone identified within the inferior pole of the left kidney. There is a nonobstructing 8 mm stone within the mid aspect of the right kidney (image 45; series 2). Mild left pelviectasis. Urinary bladder is unremarkable. Stomach/Bowel: Small hiatal hernia. Normal morphology of the stomach. No evidence for bowel obstruction. No free fluid or free intraperitoneal air. Normal appendix. Vascular/Lymphatic: Normal caliber abdominal aorta. There is a 6.7 x 5.1 cm soft tissue mass within the left periaortic location (image 35; series 2). Anteriorly there is an additional 3.6 x 3.3 cm left periaortic soft tissue mass (image 44;  series 2). Reproductive: Mildly enlarged prostate. Other: Fat containing left inguinal hernia. Musculoskeletal: There is a 5.6 x 3.9 cm soft tissue mass interspersed between the anterior right sixth and seventh ribs, incompletely included. Patchy lucencies demonstrated within the T10 vertebral body extending to the right posterior elements (image 96; series 6). Mild superior endplate compression deformity at this level. There is a soft tissue mass involving the right posterior elements at the T12 vertebral level which measures 3.9 x 3.6 cm with associated osseous destruction. Soft tissue mass extends into the canal at this level (image 21; series 2). Small oval lucent lesion within the posterior L1 vertebral body. Patchy lucency throughout the L3 vertebral body which is collapsed involving both the superior and inferior endplates. Soft tissue mass extends into the left aspect of the posterior elements as well as into the spinal canal with associated marked narrowing. Soft tissue mass at this location measures up to 4 cm. Patchy lucencies demonstrated within the pubic symphysis bilaterally (image 84; series 2). Lucent lesion within the right ilium with associated soft tissue mass (image 58; series 2). IMPRESSION: Multiple aggressive osseous lesions with associated soft tissue masses demonstrated within the lower thoracic and lumbar spine as well as pelvis and right anterior chest wall. Additionally there are two large retroperitoneal soft tissue masses within the left periaortic location. Overall findings are concerning for metastatic disease or myeloma. Osseous destruction and soft tissue mass involving the L3 vertebral body with approximately 75% collapse at this level. The mass extends to the posterior elements and into the spinal canal were there is marked of narrowing. Osseous destruction of the posterior elements at the T12 vertebral level with extension of the soft tissue mass into the canal at this level.  Aggressive osseous lesion involving  the posterior T10 vertebral body extending to the posterior elements. Given these extensive findings involving the spine and spinal canal, consider further evaluation with MRI of the thoracic and lumbar spine. Mild left pelviectasis, potentially secondary to mass effect from the large adjacent retroperitoneal mass. Bilateral nephrolithiasis. Nonobstructing 8 mm stone within the mid right ureter. These results were called by telephone at the time of interpretation on 06/28/2017 at 11:24 am to Dr. Nanda Quinton , who verbally acknowledged these results. Electronically Signed   By: Lovey Newcomer M.D.   On: 06/28/2017 11:30   Dg Hip Unilat W Or Wo Pelvis 2-3 Views Left  Result Date: 06/28/2017 CLINICAL DATA:  Worsening low back and left hip pain. EXAM: DG HIP (WITH OR WITHOUT PELVIS) 2-3V LEFT COMPARISON:  None. FINDINGS: Bones of the pelvis appear normal. Sacroiliac joints and symphysis pubis appear normal. Both hip joints appear normal. No evidence of osteoarthritis, avascular necrosis or other finding. Phleboliths are noted in the pelvis and lower abdomen. IMPRESSION: Negative.  No abnormality seen to explain hip pain. Electronically Signed   By: Nelson Chimes M.D.   On: 06/28/2017 09:59       IMPRESSION/PLAN: 47. 56 year old male with newly diagnosed diffuse metastatic disease, unknown primary- pending pathology. He has significant low back pain secondary to diffuse osseous metastatic disease in the thoracolumbar spine.Today, I talked to the patient about the findings and workup thus far. We discussed the natural history of metastatic carcinoma and general treatment, highlighting the role of palliative radiotherapy in the management. We discussed the need for confirmatory pathology to help further guide his treatment recommendations but anticipate that we will be able to offer palliative radiotherapy to help manage his pain. We discussed the available radiation techniques, and  focused on the details of logistics and delivery. We reviewed the anticipated acute and late sequelae associated with radiation in this setting. The patient was encouraged to ask questions that were answered to his satisfaction.  At the end of our conversation, the patient is interested in proceeding with palliative radiotherapy to the thoracolumbar spine. We will await final pathology results but tentatively plan for a CT simulation on Monday, July 30 for treatment to begin later that same day. His case has been reviewed with Dr. Lisbeth Renshaw who agrees with this plan.    Nicholos Johns, PA-C

## 2017-06-29 NOTE — Progress Notes (Addendum)
Spoke with patient and brother at bedside. Patient very concerned about insurance issues, thinks he may qualify for disability through a previous employer or Medicaid. Contacted financial counselor to follow up regarding Medicaid. Discussed with patient that he will need to have PCP to complete disability process. Provided patient with list of available community resources including primary care. Patient would like to use [facility] if possible, they are unable to schedule until next week. Contacted the Patient [place], appt scheduled and placed on AVS. Patient not sure of discharge date, awaiting further testing. Will continue to follow.

## 2017-06-29 NOTE — Progress Notes (Signed)
Duaine Radin   DOB:[birth year]   BJ#:[id number]   HYQ#:[id number]  Subjective: Mr Schroll has less pain, was able to get to [facility] w assistance and to chair; friend in room   Objective:  Vitals:   06/29/17 0542 06/29/17 1400  BP: (!) 142/77 (!) 153/78  Pulse: 97 (!) 101  Resp: 15 16  Temp: 98.2 F (36.8 C) 98.4 F (36.9 C)    Body mass index is 26 kg/m.  Intake/Output Summary (Last 24 hours) at 06/29/17 1407 Last data filed at 06/29/17 1300  Gross per 24 hour  Intake                0 ml  Output                0 ml  Net                0 ml    Exam not repeated  CBG (last 3)  No results for input(s): GLUCAP in the last 72 hours.   Labs:  Lab Results  Component Value Date   WBC 6.7 06/29/2017   HGB 13.7 06/29/2017   HCT 40.4 06/29/2017   MCV 75.1 (L) 06/29/2017   PLT 354 06/29/2017   NEUTROABS 5.7 06/28/2017    @LASTCHEMISTRY @  Urine Studies No results for input(s): UHGB, CRYS in the last 72 hours.  Invalid input(s): UACOL, UAPR, USPG, UPH, UTP, UGL, UKET, UBIL, UNIT, UROB, ULEU, UEPI, UWBC, URBC, UBAC, CAST, UCOM, BILUA  Basic Metabolic Panel:  Recent Labs Lab 06/28/17 0919 06/29/17 0351  NA 138 139  K 4.1 4.1  CL 104 103  CO2 23 27  GLUCOSE 108* 130*  BUN 14 20  CREATININE 0.84 0.70  CALCIUM 9.1 10.0   GFR Estimated Creatinine Clearance: 117 mL/min (by C-G formula based on SCr of 0.7 mg/dL). Liver Function Tests:  Recent Labs Lab 06/28/17 0919  AST 45*  ALT 30  ALKPHOS 101  BILITOT 1.6*  PROT 7.6  ALBUMIN 3.9   No results for input(s): LIPASE, AMYLASE in the last 168 hours. No results for input(s): AMMONIA in the last 168 hours. Coagulation profile  Recent Labs Lab 06/29/17 0847  INR 1.06    CBC:  Recent Labs Lab 06/28/17 0919 06/29/17 0351  WBC 7.8 6.7  NEUTROABS 5.7  --   HGB 14.1 13.7  HCT 40.7 40.4  MCV 75.1* 75.1*  PLT 332 354   Cardiac Enzymes: No results for input(s): CKTOTAL, CKMB, CKMBINDEX, TROPONINI in the last 168  hours. BNP: Invalid input(s): POCBNP CBG: No results for input(s): GLUCAP in the last 168 hours. D-Dimer No results for input(s): DDIMER in the last 72 hours. Hgb A1c No results for input(s): HGBA1C in the last 72 hours. Lipid Profile No results for input(s): CHOL, HDL, LDLCALC, TRIG, CHOLHDL, LDLDIRECT in the last 72 hours. Thyroid function studies No results for input(s): TSH, T4TOTAL, T3FREE, THYROIDAB in the last 72 hours.  Invalid input(s): FREET3 Anemia work up No results for input(s): VITAMINB12, FOLATE, FERRITIN, TIBC, IRON, RETICCTPCT in the last 72 hours. Microbiology No results found for this or any previous visit (from the past 240 hour(s)).    Studies:  Dg Knee 2 Views Left  Result Date: 06/28/2017 CLINICAL DATA:  Left hip and leg pain which is worsening. EXAM: LEFT KNEE - 1-2 VIEW COMPARISON:  None. FINDINGS: No joint effusion. No joint space narrowing. No significant osteophytes. No sign of avascular necrosis. Benign sclerotic focus in the posterior aspect of the  proximal tibia, not significant. IMPRESSION: Negative.  No cause of pain identified. Electronically Signed   By: Nelson Chimes M.D.   On: 06/28/2017 09:59   Mr Thoracic Spine W Wo Contrast  Result Date: 06/28/2017 CLINICAL DATA:  multiple osseous lesions.  Possible myeloma. EXAM: MRI THORACIC AND LUMBAR SPINE WITHOUT AND WITH CONTRAST TECHNIQUE: Multiplanar and multiecho pulse sequences of the thoracic and lumbar spine were obtained without and with intravenous contrast. CONTRAST:  77mL MULTIHANCE GADOBENATE DIMEGLUMINE 529 MG/ML IV SOLN COMPARISON:  CT abdomen pelvis 06/28/2017 FINDINGS: MRI THORACIC SPINE FINDINGS Alignment:  Normal Vertebrae: There are multiple osseous lesions, as seen on the concomitant CT: -- T2, left transverse process -- T6, inferior vertebral body, small lesion -- T7, large lesion involving most of the vertebral body -- T8, large lesion of the left pedicle extending into the left T7-T8  foramen and involving the most proximal aspect of the adjacent rib -- T10, large lesion involving most of the vertebral body. Mild superior endplate compression. --T12, large lesion centered within the right pedicle, involving the posterior vertebral body and lateral aspect of the right lamina, as well as the adjacent proximal rib. This mass also invades the T11-12 and T12-L1 neural foramina. Cord:  Normal signal and morphology. Paraspinal and other soft tissues: No visible paraspinal or retroperitoneal abnormality. Please see dedicated report for concomitant CT of the abdomen and pelvis. Disc levels: T7-T8: The left neural foramen is severely narrowed by the adjacent soft tissue mass. No central spinal canal or right neural foraminal stenosis. T11-T12: Mild narrowing of the right neural foramen secondary to adjacent soft tissue mass. MRI LUMBAR SPINE FINDINGS Segmentation:  Normal Alignment:  Normal Vertebrae: There is a a large lesion of the L3 vertebral body with an associated burst fracture with approximately 50% height loss and retropulsion measuring 6 mm. There is a small lesion of the right L4 pedicle. Conus medullaris: Extends to the lower L1 level and appears normal. Paraspinal and other soft tissues: Left prevertebral mass at the L1 level measures 3.1 x 1.6 x 3.9 cm. Large para- aortic mass at the level of the left renal vessels measures 5.8 x 4.0 x 9.4 cm. Disc levels: T12-L1: There is mild right foraminal stenosis due to invasion by the right pedicular soft tissue mass. L1-L2: Normal disc space and facets. No spinal canal or neuroforaminal stenosis. L2-L3: At the inferior aspect of the disc space, there is severe narrowing of the left lateral recess due to retropulsion of the superior endplate of L3. This displaces the descending nerve root. The soft tissue component of the L3 mass is in close proximity to the exiting left L2 root. At the mid L3 level, there is moderate spinal canal stenosis. L3-L4:  Moderate left neural foraminal stenosis secondary to invasion by L3 mass. No spinal canal stenosis. L4-L5: Small central disc protrusion without stenosis. L5-S1: Normal disc space and facets. No spinal canal or neuroforaminal stenosis. Visualized sacrum: Lesion within the right sacral ala measures up to 3.4 cm. IMPRESSION: 1. Pathologic burst fracture of the L3 vertebral body with 6 mm of retropulsion and approximately 50% height loss, resulting and severe left lateral recess narrowing at L2-L3, moderate spinal canal stenosis directly posterior to the L3 vertebral body and moderate left L3-4 neural foraminal stenosis. 2. 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 Neural foraminal invasion at T7-T8, T11-T12 and T12-L1. 3. Multiple retroperitoneal soft tissue masses.  Electronically Signed   By: Ulyses Jarred M.D.   On: 06/28/2017 15:37   Mr Lumbar Spine W Wo Contrast  Result Date: 06/28/2017 CLINICAL DATA:  multiple osseous lesions.  Possible myeloma. EXAM: MRI THORACIC AND LUMBAR SPINE WITHOUT AND WITH CONTRAST TECHNIQUE: Multiplanar and multiecho pulse sequences of the thoracic and lumbar spine were obtained without and with intravenous contrast. CONTRAST:  40mL MULTIHANCE GADOBENATE DIMEGLUMINE 529 MG/ML IV SOLN COMPARISON:  CT abdomen pelvis 06/28/2017 FINDINGS: MRI THORACIC SPINE FINDINGS Alignment:  Normal Vertebrae: There are multiple osseous lesions, as seen on the concomitant CT: -- T2, left transverse process -- T6, inferior vertebral body, small lesion -- T7, large lesion involving most of the vertebral body -- T8, large lesion of the left pedicle extending into the left T7-T8 foramen and involving the most proximal aspect of the adjacent rib -- T10, large lesion involving most of the vertebral body. Mild superior endplate compression. --T12, large lesion centered within the right pedicle, involving the posterior vertebral  body and lateral aspect of the right lamina, as well as the adjacent proximal rib. This mass also invades the T11-12 and T12-L1 neural foramina. Cord:  Normal signal and morphology. Paraspinal and other soft tissues: No visible paraspinal or retroperitoneal abnormality. Please see dedicated report for concomitant CT of the abdomen and pelvis. Disc levels: T7-T8: The left neural foramen is severely narrowed by the adjacent soft tissue mass. No central spinal canal or right neural foraminal stenosis. T11-T12: Mild narrowing of the right neural foramen secondary to adjacent soft tissue mass. MRI LUMBAR SPINE FINDINGS Segmentation:  Normal Alignment:  Normal Vertebrae: There is a a large lesion of the L3 vertebral body with an associated burst fracture with approximately 50% height loss and retropulsion measuring 6 mm. There is a small lesion of the right L4 pedicle. Conus medullaris: Extends to the lower L1 level and appears normal. Paraspinal and other soft tissues: Left prevertebral mass at the L1 level measures 3.1 x 1.6 x 3.9 cm. Large para- aortic mass at the level of the left renal vessels measures 5.8 x 4.0 x 9.4 cm. Disc levels: T12-L1: There is mild right foraminal stenosis due to invasion by the right pedicular soft tissue mass. L1-L2: Normal disc space and facets. No spinal canal or neuroforaminal stenosis. L2-L3: At the inferior aspect of the disc space, there is severe narrowing of the left lateral recess due to retropulsion of the superior endplate of L3. This displaces the descending nerve root. The soft tissue component of the L3 mass is in close proximity to the exiting left L2 root. At the mid L3 level, there is moderate spinal canal stenosis. L3-L4: Moderate left neural foraminal stenosis secondary to invasion by L3 mass. No spinal canal stenosis. L4-L5: Small central disc protrusion without stenosis. L5-S1: Normal disc space and facets. No spinal canal or neuroforaminal stenosis. Visualized sacrum:  Lesion within the right sacral ala measures up to 3.4 cm. IMPRESSION: 1. Pathologic burst fracture of the L3 vertebral body with 6 mm of retropulsion and approximately 50% height loss, resulting and severe left lateral recess narrowing at L2-L3, moderate spinal canal stenosis directly posterior to the L3 vertebral body and moderate left L3-4 neural foraminal stenosis. 2. 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 Neural foraminal invasion at T7-T8, T11-T12 and T12-L1. 3. Multiple retroperitoneal soft tissue masses. Electronically Signed   By: Ulyses Jarred M.D.   On:  06/28/2017 15:37   Dg Chest Port 1 View  Result Date: 06/28/2017 CLINICAL DATA:  Evaluate for possible metastatic disease. EXAM: PORTABLE CHEST 1 VIEW COMPARISON:  None. FINDINGS: Cardiac shadow is within normal limits. There is a vague somewhat nodular density identified in the mid left lung. This may be related to a rib lesion as seen on recent MRI examination. No other focal abnormality is noted. IMPRESSION: Vague density in the left mid lung which likely represents a rib lesion as seen on recent MRI. CT of the chest is recommended for further evaluation. Electronically Signed   By: Inez Catalina M.D.   On: 06/28/2017 18:47   Ct Renal Stone Study  Result Date: 06/28/2017 CLINICAL DATA:  Patient with left flank pain.  Difficulty standing. EXAM: CT ABDOMEN AND PELVIS WITHOUT CONTRAST TECHNIQUE: Multidetector CT imaging of the abdomen and pelvis was performed following the standard protocol without IV contrast. COMPARISON:  None. FINDINGS: Lower chest: Normal heart size. Dependent atelectasis within the bilateral lower lobes. Hepatobiliary: Liver is normal in size and contour. Gallbladder is unremarkable. Pancreas: Unremarkable Spleen: Unremarkable Adrenals/Urinary Tract: The adrenal glands are normal. Bilateral renal stones, right-greater-than-left, including a 9 mm  stone inferior pole right kidney. Punctate stone identified within the inferior pole of the left kidney. There is a nonobstructing 8 mm stone within the mid aspect of the right kidney (image 45; series 2). Mild left pelviectasis. Urinary bladder is unremarkable. Stomach/Bowel: Small hiatal hernia. Normal morphology of the stomach. No evidence for bowel obstruction. No free fluid or free intraperitoneal air. Normal appendix. Vascular/Lymphatic: Normal caliber abdominal aorta. There is a 6.7 x 5.1 cm soft tissue mass within the left periaortic location (image 35; series 2). Anteriorly there is an additional 3.6 x 3.3 cm left periaortic soft tissue mass (image 44; series 2). Reproductive: Mildly enlarged prostate. Other: Fat containing left inguinal hernia. Musculoskeletal: There is a 5.6 x 3.9 cm soft tissue mass interspersed between the anterior right sixth and seventh ribs, incompletely included. Patchy lucencies demonstrated within the T10 vertebral body extending to the right posterior elements (image 96; series 6). Mild superior endplate compression deformity at this level. There is a soft tissue mass involving the right posterior elements at the T12 vertebral level which measures 3.9 x 3.6 cm with associated osseous destruction. Soft tissue mass extends into the canal at this level (image 21; series 2). Small oval lucent lesion within the posterior L1 vertebral body. Patchy lucency throughout the L3 vertebral body which is collapsed involving both the superior and inferior endplates. Soft tissue mass extends into the left aspect of the posterior elements as well as into the spinal canal with associated marked narrowing. Soft tissue mass at this location measures up to 4 cm. Patchy lucencies demonstrated within the pubic symphysis bilaterally (image 84; series 2). Lucent lesion within the right ilium with associated soft tissue mass (image 58; series 2). IMPRESSION: Multiple aggressive osseous lesions with  associated soft tissue masses demonstrated within the lower thoracic and lumbar spine as well as pelvis and right anterior chest wall. Additionally there are two large retroperitoneal soft tissue masses within the left periaortic location. Overall findings are concerning for metastatic disease or myeloma. Osseous destruction and soft tissue mass involving the L3 vertebral body with approximately 75% collapse at this level. The mass extends to the posterior elements and into the spinal canal were there is marked of narrowing. Osseous destruction of the posterior elements at the T12 vertebral level with extension of the soft  tissue mass into the canal at this level. Aggressive osseous lesion involving the posterior T10 vertebral body extending to the posterior elements. Given these extensive findings involving the spine and spinal canal, consider further evaluation with MRI of the thoracic and lumbar spine. Mild left pelviectasis, potentially secondary to mass effect from the large adjacent retroperitoneal mass. Bilateral nephrolithiasis. Nonobstructing 8 mm stone within the mid right ureter. These results were called by telephone at the time of interpretation on 06/28/2017 at 11:24 am to Dr. Nanda Quinton , who verbally acknowledged these results. Electronically Signed   By: Lovey Newcomer M.D.   On: 06/28/2017 11:30   Dg Hip Unilat W Or Wo Pelvis 2-3 Views Left  Result Date: 06/28/2017 CLINICAL DATA:  Worsening low back and left hip pain. EXAM: DG HIP (WITH OR WITHOUT PELVIS) 2-3V LEFT COMPARISON:  None. FINDINGS: Bones of the pelvis appear normal. Sacroiliac joints and symphysis pubis appear normal. Both hip joints appear normal. No evidence of osteoarthritis, avascular necrosis or other finding. Phleboliths are noted in the pelvis and lower abdomen. IMPRESSION: Negative.  No abnormality seen to explain hip pain. Electronically Signed   By: Nelson Chimes M.D.   On: 06/28/2017 09:59    Assessment: 56 y.o. [place]  man presenting with left hip pain, found to have multiple bone lesions and pelvic/retroperitoneal masses suggestive of cancer, more specifically of prostate cancer  (1) CEA, CA 19, PSA, B-2 microglobulin all normal; LDH is elevated  (2) tissue biopsy scheduled for 06/29/2017  (3) consult to radiation oncology entered and called 06/28/2017  (4) patient has f/u in my office 07/10/2017 at 4:30 PM  Plan:  I discussed results of the workup so far with Mr Husby. He seems to be responding to analgesics and steroids. Radiation oncology input is pending. [facility] suggested a brace (please refer to their note)  Likely we will not have results of his biopsy until mid next week. I will be out of town until 8/6. I have made him an appointment with me for 8/7 and he is aware of date and time. We will discuss diagnosis, prognosis and treatment plan at that time.  Please let my group know if we can be of further help   Chauncey Cruel, MD 06/29/2017  2:07 PM Medical Oncology and Hematology [facility] [place], [place] [address] Tel. [phone]    Fax. [phone]

## 2017-06-29 NOTE — Plan of Care (Signed)
Problem: Activity: Goal: Risk for activity intolerance will decrease Outcome: Progressing Requires one assist with ambulation.

## 2017-06-30 DIAGNOSIS — S32030D Wedge compression fracture of third lumbar vertebra, subsequent encounter for fracture with routine healing: Secondary | ICD-10-CM

## 2017-06-30 NOTE — Progress Notes (Signed)
Addendum to Consult Note Physical Exam for 06/29/17: Patient is grossly neurologically intact without motor or sensory deficits in bilateral lower extremities.  Strength is 5/5 and equal bilaterally in the LEs. -Yong Grieser, PA-C

## 2017-06-30 NOTE — Progress Notes (Signed)
PROGRESS NOTE    Cory Ibarra  GDJ:[id number] DOB: 1961-11-07 DOA: 06/28/2017 PCP: Patient, No Pcp Per  Brief Narrative:Sutton Banghart is a 56 y.o. male with medical history significant of progressive worsening back/L leg pain and loss of appetite for about 6 weeks now.    About 6 weeks ago he started with vague abdominal discomfort foll by pain in the left hip and left knee pain.  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, L3 burst fracture -no s/s of cord compromise at this time -has some radiculopathy related to L3 burst fracture -IR consulted, s/p biopsy of chest wall lesion -Appreciate Dr.Magrinat's help, NSG consulted, Rad Onc consult appreciated, awaiting Biopsy -continue IV Decadron, added protonix -PT consult pending -pain control, supportive care, ambulate -PSA, CEA and Ca19-9 are normal  DVT prophylaxis: SCDs, add lovenox Code Status: Full Code Family Communication: None at bedside Disposition Plan: Home Monday if stable  Consultants:   Onc  NSG  IR    Subjective: Feels better, pain improving, strength ok, numbness of L knee better Ambulating some  Objective: Vitals:   06/29/17 1535 06/29/17 1555 06/29/17 2111 06/30/17 0515  BP: 139/78 (!) 152/80 123/75 (!) 152/80  Pulse: (!) 102  (!) 105 92  Resp: 19 18 16 18   Temp:  98.5 F (36.9 C) 98 F (36.7 C) (!) 97.5 F (36.4 C)  TempSrc:  Oral Oral Oral  SpO2: 98% 96% 96% 98%  Weight:      Height:        Intake/Output Summary (Last 24 hours) at 06/30/17 1127 Last data filed at 06/30/17 0958  Gross per 24 hour  Intake             1263 ml  Output                 0 ml  Net             1263 ml   Filed Weights   06/28/17 1500  Weight: 91.9 kg (202 lb 8 oz)    Examination:  Gen: Awake, Alert, Oriented X 3, pleasant HEENT: PERRLA, Neck supple, no JVD Lungs: Good air movement bilaterally, CTAB CVS: RRR,No Gallops,Rubs or new Murmurs Abd: soft, Non tender, non distended, BS present Extremities: No Cyanosis, Clubbing or edema Neuro: strength 5/5, sensory: diminished light touch L knee Skin: no new rashes    Data Reviewed:   CBC:  Recent Labs Lab 06/28/17 0919 06/29/17 0351  WBC 7.8 6.7  NEUTROABS 5.7  --   HGB 14.1 13.7  HCT 40.7 40.4  MCV 75.1* 75.1*  PLT 332 622   Basic Metabolic Panel:  Recent Labs Lab 06/28/17 0919 06/29/17 0351  NA 138 139  K 4.1 4.1  CL 104 103  CO2 23 27  GLUCOSE 108* 130*  BUN 14 20  CREATININE 0.84 0.70  CALCIUM 9.1 10.0   GFR: Estimated Creatinine Clearance: 117 mL/min (by C-G formula based on SCr of 0.7 mg/dL). Liver Function Tests:  Recent Labs Lab 06/28/17 0919  AST 45*  ALT 30  ALKPHOS 101  BILITOT 1.6*  PROT 7.6  ALBUMIN 3.9   No results for input(s): LIPASE, AMYLASE  in the last 168 hours. No results for input(s): AMMONIA in the last 168 hours. Coagulation Profile:  Recent Labs Lab 06/29/17 0847  INR 1.06   Cardiac Enzymes: No results for input(s): CKTOTAL, CKMB, CKMBINDEX, TROPONINI in the last 168 hours. BNP (last 3 results) No results for input(s): PROBNP in the last 8760 hours. HbA1C: No results for input(s): HGBA1C in the last 72 hours. CBG: No results for input(s): GLUCAP in the last 168 hours. Lipid Profile: No results for input(s): CHOL, HDL, LDLCALC, TRIG, CHOLHDL, LDLDIRECT in the last 72 hours. Thyroid Function Tests: No results for input(s): TSH, T4TOTAL, FREET4, T3FREE, THYROIDAB in the last 72 hours. Anemia Panel: No results for input(s): VITAMINB12, FOLATE, FERRITIN, TIBC, IRON, RETICCTPCT in the last 72 hours. Urine analysis:    Component  Value Date/Time   COLORURINE YELLOW 06/28/2017 [place] 06/28/2017 0937   LABSPEC 1.020 06/28/2017 0937   PHURINE 5.0 06/28/2017 0937   GLUCOSEU NEGATIVE 06/28/2017 0937   HGBUR SMALL (A) 06/28/2017 0937   BILIRUBINUR NEGATIVE 06/28/2017 0937   KETONESUR NEGATIVE 06/28/2017 0937   PROTEINUR NEGATIVE 06/28/2017 0937   NITRITE NEGATIVE 06/28/2017 0937   LEUKOCYTESUR NEGATIVE 06/28/2017 0937   Sepsis Labs: @LABRCNTIP (procalcitonin:4,lacticidven:4)  )No results found for this or any previous visit (from the past 240 hour(s)).       Radiology Studies: Mr Thoracic Spine W Wo Contrast  Result Date: 06/28/2017 CLINICAL DATA:  multiple osseous lesions.  Possible myeloma. EXAM: MRI THORACIC AND LUMBAR SPINE WITHOUT AND WITH CONTRAST TECHNIQUE: Multiplanar and multiecho pulse sequences of the thoracic and lumbar spine were obtained without and with intravenous contrast. CONTRAST:  59mL MULTIHANCE GADOBENATE DIMEGLUMINE 529 MG/ML IV SOLN COMPARISON:  CT abdomen pelvis 06/28/2017 FINDINGS: MRI THORACIC SPINE FINDINGS Alignment:  Normal Vertebrae: There are multiple osseous lesions, as seen on the concomitant CT: -- T2, left transverse process -- T6, inferior vertebral body, small lesion -- T7, large lesion involving most of the vertebral body -- T8, large lesion of the left pedicle extending into the left T7-T8 foramen and involving the most proximal aspect of the adjacent rib -- T10, large lesion involving most of the vertebral body. Mild superior endplate compression. --T12, large lesion centered within the right pedicle, involving the posterior vertebral body and lateral aspect of the right lamina, as well as the adjacent proximal rib. This mass also invades the T11-12 and T12-L1 neural foramina. Cord:  Normal signal and morphology. Paraspinal and other soft tissues: No visible paraspinal or retroperitoneal abnormality. Please see dedicated report for concomitant CT of the abdomen and  pelvis. Disc levels: T7-T8: The left neural foramen is severely narrowed by the adjacent soft tissue mass. No central spinal canal or right neural foraminal stenosis. T11-T12: Mild narrowing of the right neural foramen secondary to adjacent soft tissue mass. MRI LUMBAR SPINE FINDINGS Segmentation:  Normal Alignment:  Normal Vertebrae: There is a a large lesion of the L3 vertebral body with an associated burst fracture with approximately 50% height loss and retropulsion measuring 6 mm. There is a small lesion of the right L4 pedicle. Conus medullaris: Extends to the lower L1 level and appears normal. Paraspinal and other soft tissues: Left prevertebral mass at the L1 level measures 3.1 x 1.6 x 3.9 cm. Large para- aortic mass at the level of the left renal vessels measures 5.8 x 4.0 x 9.4 cm. Disc levels: T12-L1: There is mild right foraminal stenosis due to invasion by the right pedicular soft tissue mass. L1-L2:  Normal disc space and facets. No spinal canal or neuroforaminal stenosis. L2-L3: At the inferior aspect of the disc space, there is severe narrowing of the left lateral recess due to retropulsion of the superior endplate of L3. This displaces the descending nerve root. The soft tissue component of the L3 mass is in close proximity to the exiting left L2 root. At the mid L3 level, there is moderate spinal canal stenosis. L3-L4: Moderate left neural foraminal stenosis secondary to invasion by L3 mass. No spinal canal stenosis. L4-L5: Small central disc protrusion without stenosis. L5-S1: Normal disc space and facets. No spinal canal or neuroforaminal stenosis. Visualized sacrum: Lesion within the right sacral ala measures up to 3.4 cm. IMPRESSION: 1. Pathologic burst fracture of the L3 vertebral body with 6 mm of retropulsion and approximately 50% height loss, resulting and severe left lateral recess narrowing at L2-L3, moderate spinal canal stenosis directly posterior to the L3 vertebral body and moderate  left L3-4 neural foraminal stenosis. 2. 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 Neural foraminal invasion at T7-T8, T11-T12 and T12-L1. 3. Multiple retroperitoneal soft tissue masses. Electronically Signed   By: Ulyses Jarred M.D.   On: 06/28/2017 15:37   Mr Lumbar Spine W Wo Contrast  Result Date: 06/28/2017 CLINICAL DATA:  multiple osseous lesions.  Possible myeloma. EXAM: MRI THORACIC AND LUMBAR SPINE WITHOUT AND WITH CONTRAST TECHNIQUE: Multiplanar and multiecho pulse sequences of the thoracic and lumbar spine were obtained without and with intravenous contrast. CONTRAST:  54mL MULTIHANCE GADOBENATE DIMEGLUMINE 529 MG/ML IV SOLN COMPARISON:  CT abdomen pelvis 06/28/2017 FINDINGS: MRI THORACIC SPINE FINDINGS Alignment:  Normal Vertebrae: There are multiple osseous lesions, as seen on the concomitant CT: -- T2, left transverse process -- T6, inferior vertebral body, small lesion -- T7, large lesion involving most of the vertebral body -- T8, large lesion of the left pedicle extending into the left T7-T8 foramen and involving the most proximal aspect of the adjacent rib -- T10, large lesion involving most of the vertebral body. Mild superior endplate compression. --T12, large lesion centered within the right pedicle, involving the posterior vertebral body and lateral aspect of the right lamina, as well as the adjacent proximal rib. This mass also invades the T11-12 and T12-L1 neural foramina. Cord:  Normal signal and morphology. Paraspinal and other soft tissues: No visible paraspinal or retroperitoneal abnormality. Please see dedicated report for concomitant CT of the abdomen and pelvis. Disc levels: T7-T8: The left neural foramen is severely narrowed by the adjacent soft tissue mass. No central spinal canal or right neural foraminal stenosis. T11-T12: Mild narrowing of the right neural foramen secondary to adjacent soft tissue  mass. MRI LUMBAR SPINE FINDINGS Segmentation:  Normal Alignment:  Normal Vertebrae: There is a a large lesion of the L3 vertebral body with an associated burst fracture with approximately 50% height loss and retropulsion measuring 6 mm. There is a small lesion of the right L4 pedicle. Conus medullaris: Extends to the lower L1 level and appears normal. Paraspinal and other soft tissues: Left prevertebral mass at the L1 level measures 3.1 x 1.6 x 3.9 cm. Large para- aortic mass at the level of the left renal vessels measures 5.8 x 4.0 x 9.4 cm. Disc levels: T12-L1: There is mild right foraminal stenosis due to invasion by the right pedicular soft tissue mass. L1-L2: Normal disc space and facets. No spinal canal or neuroforaminal stenosis. L2-L3:  At the inferior aspect of the disc space, there is severe narrowing of the left lateral recess due to retropulsion of the superior endplate of L3. This displaces the descending nerve root. The soft tissue component of the L3 mass is in close proximity to the exiting left L2 root. At the mid L3 level, there is moderate spinal canal stenosis. L3-L4: Moderate left neural foraminal stenosis secondary to invasion by L3 mass. No spinal canal stenosis. L4-L5: Small central disc protrusion without stenosis. L5-S1: Normal disc space and facets. No spinal canal or neuroforaminal stenosis. Visualized sacrum: Lesion within the right sacral ala measures up to 3.4 cm. IMPRESSION: 1. Pathologic burst fracture of the L3 vertebral body with 6 mm of retropulsion and approximately 50% height loss, resulting and severe left lateral recess narrowing at L2-L3, moderate spinal canal stenosis directly posterior to the L3 vertebral body and moderate left L3-4 neural foraminal stenosis. 2. 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 Neural foraminal invasion at T7-T8, T11-T12 and T12-L1. 3. Multiple retroperitoneal soft tissue  masses. Electronically Signed   By: Ulyses Jarred M.D.   On: 06/28/2017 15:37   [place] Core Biopsy  Result Date: 06/29/2017 CLINICAL DATA:  Destructive bone lesions including right anterior chest wall mass. EXAM: ULTRASOUND GUIDED CORE BIOPSY OF CHEST WALL MASS MEDICATIONS: 2.0 mg IV Versed; 100 mcg IV Fentanyl Total Moderate Sedation Time: 10 minutes The patient's level of consciousness and physiologic status were continuously monitored during the procedure by Radiology nursing. PROCEDURE: The procedure, risks, benefits, and alternatives were explained to the patient. Questions regarding the procedure were encouraged and answered. The patient understands and consents to the procedure. A time out was performed prior to initiating the procedure. The right anterior chest wall was prepped with chlorhexidine in a sterile fashion, and a sterile drape was applied covering the operative field. A sterile gown and sterile gloves were used for the procedure. Local anesthesia was provided with 1% Lidocaine. Ultrasound was used to localize a right anterior chest wall mass. A 17 gauge needle was advanced to the medial margin of the mass. Five separate 18 gauge core biopsy samples were then obtained through the lesion. Four samples were submitted in formalin and 1 sample submitted in saline. Post biopsy imaging was performed with ultrasound. COMPLICATIONS: None. FINDINGS: Lobulated and hypoechoic soft tissue mass is present in the right anterior chest wall. This measures at least 6 cm in greatest diameter. Solid tissue was obtained. IMPRESSION: Ultrasound-guided core biopsy performed of a right anterior chest wall mass. Electronically Signed   By: Aletta Edouard M.D.   On: 06/29/2017 16:02   Dg Chest Port 1 View  Result Date: 06/28/2017 CLINICAL DATA:  Evaluate for possible metastatic disease. EXAM: PORTABLE CHEST 1 VIEW COMPARISON:  None. FINDINGS: Cardiac shadow is within normal limits. There is a vague somewhat nodular  density identified in the mid left lung. This may be related to a rib lesion as seen on recent MRI examination. No other focal abnormality is noted. IMPRESSION: Vague density in the left mid lung which likely represents a rib lesion as seen on recent MRI. CT of the chest is recommended for further evaluation. Electronically Signed   By: Inez Catalina M.D.   On: 06/28/2017 18:47        Scheduled Meds: . dexamethasone  8 mg Intravenous Q12H  . pantoprazole  40 mg Oral Q1200  . sodium chloride flush  3 mL Intravenous Q12H   Continuous Infusions: . sodium chloride       LOS: 2 days    Time spent: 27min    Domenic Polite, MD [facility] Pager [phone]  If 7PM-7AM, please contact night-coverage [email or web address] Password [facility] 06/30/2017, 11:27 AM

## 2017-07-01 DIAGNOSIS — R634 Abnormal weight loss: Secondary | ICD-10-CM

## 2017-07-01 NOTE — Evaluation (Signed)
Physical Therapy Evaluation Patient Details Name: Cory Ibarra MRN: [id number] DOB: 1961-09-14 Today's Date: 07/01/2017   History of Present Illness  Cory Ibarra a 56 y.o.malewith medical history significant of progressive worsening back/L leg pain and loss of appetite. Treated for sciatica recently. Increased pain of left leg. CT revealed L3 burst fracture with retropulsion, Multiple other osseous metastatic lesions at the T2, T6, T7, T8,T10, T12 and L4 levels and within the right sacral ala  Clinical Impression  The patient is up ad lib without brace in place. Reviewed don and doff of TLSO. Encouraged to wear when ambulating. Patient is going into shower soon. Will review don/doff brace tomorrow. No further  PT needs after review tomorrow. Pt admitted with above diagnosis. Pt currently with functional limitations due to the deficits listed below (see PT Problem List). Pt will benefit from skilled PT to increase their independence and safety with mobility to allow discharge to the venue listed below.       Follow Up Recommendations No PT follow up    Equipment Recommendations  None recommended by PT    Recommendations for Other Services       Precautions / Restrictions Precautions Required Braces or Orthoses: Spinal Brace Spinal Brace: Thoracolumbosacral orthotic;Applied in standing position Other Brace/Splint: patient up ad lib in room without the brace in place. encouraged patient to wear when up. Restrictions Weight Bearing Restrictions: No      Mobility  Bed Mobility               General bed mobility comments: up in room.  Transfers Overall transfer level: Independent Equipment used: None                Ambulation/Gait Ambulation/Gait assistance: Independent Ambulation Distance (Feet): 800 Feet Assistive device: None Gait Pattern/deviations: WFL(Within Functional Limits)     General Gait Details: assisted with don/doff brace in standing.  Stairs             Wheelchair Mobility    Modified Rankin (Stroke Patients Only)       Balance Overall balance assessment: Independent                                           Pertinent Vitals/Pain Pain Assessment: 0-10 Pain Score: 3  Pain Location: left thigh Pain Intervention(s): Premedicated before session    Home Living Family/patient expects to be discharged to:: Private residence Living Arrangements: Other relatives Available Help at Discharge: Family Type of Home: House Home Access: Stairs to enter   Technical brewer of Steps: 1 Home Layout: One level Home Equipment: None      Prior Function Level of Independence: Independent               Hand Dominance        Extremity/Trunk Assessment   Upper Extremity Assessment Upper Extremity Assessment: Overall WFL for tasks assessed    Lower Extremity Assessment Lower Extremity Assessment: Overall WFL for tasks assessed    Cervical / Trunk Assessment Cervical / Trunk Assessment: Normal  Communication   Communication: No difficulties  Cognition Arousal/Alertness: Awake/alert Behavior During Therapy: WFL for tasks assessed/performed Overall Cognitive Status: Within Functional Limits for tasks assessed  General Comments      Exercises     Assessment/Plan    PT Assessment Patient needs continued PT services  PT Problem List Decreased knowledge of precautions;Decreased safety awareness       PT Treatment Interventions Gait training;Functional mobility training;Patient/family education    PT Goals (Current goals can be found in the Care Plan section)  Acute Rehab PT Goals Patient Stated Goal: to beat this cancer PT Goal Formulation: With patient Time For Goal Achievement: 07/08/17 Potential to Achieve Goals: Good    Frequency Min 2X/week   Barriers to discharge        Co-evaluation               AM-PAC  PT "6 Clicks" Daily Activity  Outcome Measure Difficulty turning over in bed (including adjusting bedclothes, sheets and blankets)?: None Difficulty moving from lying on back to sitting on the side of the bed? : None Difficulty sitting down on and standing up from a chair with arms (e.g., wheelchair, bedside commode, etc,.)?: None Help needed moving to and from a bed to chair (including a wheelchair)?: None Help needed walking in hospital room?: None Help needed climbing 3-5 steps with a railing? : None 6 Click Score: 24    End of Session   Activity Tolerance: Patient tolerated treatment well Patient left:  (up ad lib in room)   PT Visit Diagnosis: Other (comment)    Time: 3403-7096 PT Time Calculation (min) (ACUTE ONLY): 23 min   Charges:   PT Evaluation $PT Eval Low Complexity: 1 Procedure PT Treatments $Gait Training: 8-22 mins   PT G CodesTresa Endo PT [phone]   Claretha Cooper 07/01/2017, 9:48 AM

## 2017-07-01 NOTE — Progress Notes (Signed)
PROGRESS NOTE    Cory Ibarra  XLK:[id number] DOB: Oct 05, 1961 DOA: 06/28/2017 PCP: Patient, No Pcp Per  Brief Narrative:Cory Ibarra is a 56 y.o. male with medical history significant of progressive worsening back/L leg pain and loss of appetite for about 6 weeks now.    About 6 weeks ago he started with vague abdominal discomfort foll by pain in the left hip and left knee pain.  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, L3 burst fracture -no s/s of cord compromise at this time -has some radiculopathy related to L3 burst fracture -IR consulted, s/p biopsy of chest wall lesion 7/27 -Appreciate Dr.Magrinat's help, NSG consulted, Rad Onc consult appreciated, awaiting Biopsy -continue IV Decadron, added protonix -PT consult completed, symptoms significantly improved -PSA, CEA and Ca19-9 are normal -Awaiting biopsy results and further plan per oncology and Rad Onc  DVT prophylaxis: lovenox Code Status: Full Code Family Communication: None at bedside Disposition Plan: Home Monday if stable  Consultants:   Onc  NSG  IR    Subjective: Using the brace now, pain is much more manageable, ambulating better, last Bm. on Friday  Objective: Vitals:   06/30/17 0515 06/30/17 1228 06/30/17 2108 07/01/17 0436  BP: (!) 152/80 (!) 161/93 131/89 (!) 149/82  Pulse: 92 100 (!) 101 85  Resp: 18 18 16 16   Temp: (!) 97.5 F (36.4 C) 98.4 F (36.9 C) 98.3 F (36.8 C) 98 F (36.7 C)  TempSrc: Oral Oral Oral Oral  SpO2: 98% 99% 97% 99%  Weight:      Height:        Intake/Output Summary (Last 24 hours) at 07/01/17  1129 Last data filed at 07/01/17 1124  Gross per 24 hour  Intake              963 ml  Output                0 ml  Net              963 ml   Filed Weights   06/28/17 1500  Weight: 91.9 kg (202 lb 8 oz)    Examination:  Gen: Awake, Alert, Oriented X 3, in good spirits, no distress HEENT: PERRLA, Neck supple, no JVD Lungs: Good air movement bilaterally, CTAB CVS: RRR,No Gallops,Rubs or new Murmurs Abd: soft, Non tender, non distended, BS present Extremities: No edemaStrength 5 out of 5 in both lower extremities, sensory decreased light touch over her left knee, rest unremarkable,  Skin: no new rashes     Data Reviewed:   CBC:  Recent Labs Lab 06/28/17 0919 06/29/17 0351  WBC 7.8 6.7  NEUTROABS 5.7  --   HGB 14.1 13.7  HCT 40.7 40.4  MCV 75.1* 75.1*  PLT 332 366   Basic Metabolic Panel:  Recent Labs Lab 06/28/17 0919 06/29/17 0351  NA 138 139  K 4.1 4.1  CL 104 103  CO2 23 27  GLUCOSE 108* 130*  BUN 14 20  CREATININE 0.84 0.70  CALCIUM 9.1 10.0   GFR: Estimated Creatinine Clearance: 117 mL/min (by C-G formula based on SCr of 0.7 mg/dL). Liver Function Tests:  Recent  Labs Lab 06/28/17 0919  AST 45*  ALT 30  ALKPHOS 101  BILITOT 1.6*  PROT 7.6  ALBUMIN 3.9   No results for input(s): LIPASE, AMYLASE in the last 168 hours. No results for input(s): AMMONIA in the last 168 hours. Coagulation Profile:  Recent Labs Lab 06/29/17 0847  INR 1.06   Cardiac Enzymes: No results for input(s): CKTOTAL, CKMB, CKMBINDEX, TROPONINI in the last 168 hours. BNP (last 3 results) No results for input(s): PROBNP in the last 8760 hours. HbA1C: No results for input(s): HGBA1C in the last 72 hours. CBG: No results for input(s): GLUCAP in the last 168 hours. Lipid Profile: No results for input(s): CHOL, HDL, LDLCALC, TRIG, CHOLHDL, LDLDIRECT in the last 72 hours. Thyroid Function Tests: No results for input(s): TSH, T4TOTAL, FREET4, T3FREE, THYROIDAB in the last  72 hours. Anemia Panel: No results for input(s): VITAMINB12, FOLATE, FERRITIN, TIBC, IRON, RETICCTPCT in the last 72 hours. Urine analysis:    Component Value Date/Time   COLORURINE YELLOW 06/28/2017 [place] 06/28/2017 0937   LABSPEC 1.020 06/28/2017 0937   PHURINE 5.0 06/28/2017 0937   GLUCOSEU NEGATIVE 06/28/2017 0937   HGBUR SMALL (A) 06/28/2017 0937   BILIRUBINUR NEGATIVE 06/28/2017 0937   KETONESUR NEGATIVE 06/28/2017 0937   PROTEINUR NEGATIVE 06/28/2017 0937   NITRITE NEGATIVE 06/28/2017 0937   LEUKOCYTESUR NEGATIVE 06/28/2017 0937   Sepsis Labs: @LABRCNTIP (procalcitonin:4,lacticidven:4)  )No results found for this or any previous visit (from the past 240 hour(s)).       Radiology Studies: [place] Core Biopsy  Result Date: 06/29/2017 CLINICAL DATA:  Destructive bone lesions including right anterior chest wall mass. EXAM: ULTRASOUND GUIDED CORE BIOPSY OF CHEST WALL MASS MEDICATIONS: 2.0 mg IV Versed; 100 mcg IV Fentanyl Total Moderate Sedation Time: 10 minutes The patient's level of consciousness and physiologic status were continuously monitored during the procedure by Radiology nursing. PROCEDURE: The procedure, risks, benefits, and alternatives were explained to the patient. Questions regarding the procedure were encouraged and answered. The patient understands and consents to the procedure. A time out was performed prior to initiating the procedure. The right anterior chest wall was prepped with chlorhexidine in a sterile fashion, and a sterile drape was applied covering the operative field. A sterile gown and sterile gloves were used for the procedure. Local anesthesia was provided with 1% Lidocaine. Ultrasound was used to localize a right anterior chest wall mass. A 17 gauge needle was advanced to the medial margin of the mass. Five separate 18 gauge core biopsy samples were then obtained through the lesion. Four samples were submitted in formalin and 1 sample  submitted in saline. Post biopsy imaging was performed with ultrasound. COMPLICATIONS: None. FINDINGS: Lobulated and hypoechoic soft tissue mass is present in the right anterior chest wall. This measures at least 6 cm in greatest diameter. Solid tissue was obtained. IMPRESSION: Ultrasound-guided core biopsy performed of a right anterior chest wall mass. Electronically Signed   By: Aletta Edouard M.D.   On: 06/29/2017 16:02        Scheduled Meds: . dexamethasone  8 mg Intravenous Q12H  . pantoprazole  40 mg Oral Q1200  . sodium chloride flush  3 mL Intravenous Q12H   Continuous Infusions: . sodium chloride       LOS: 3 days    Time spent: 63min    Domenic Polite, MD [facility] Pager [phone]  If 7PM-7AM, please contact night-coverage [email or web address] Password [facility] 07/01/2017, 11:29 AM

## 2017-07-02 ENCOUNTER — Ambulatory Visit
Admit: 2017-07-02 | Discharge: 2017-07-02 | Disposition: A | Discharge status: Home / Self Care | Payer: Medicaid Other | Attending: Radiation Oncology | Admitting: Radiation Oncology

## 2017-07-02 ENCOUNTER — Telehealth: Payer: Self-pay | Admitting: *Deleted

## 2017-07-02 DIAGNOSIS — C7951 Secondary malignant neoplasm of bone: Secondary | ICD-10-CM

## 2017-07-02 DIAGNOSIS — M549 Dorsalgia, unspecified: Secondary | ICD-10-CM

## 2017-07-02 MED ORDER — ENOXAPARIN SODIUM 40 MG/0.4ML ~~LOC~~ SOLN
40.0000 mg | SUBCUTANEOUS | Status: DC
  Administered 2017-07-02: 40 mg via SUBCUTANEOUS
  Filled 2017-07-02: qty 0.4

## 2017-07-02 MED ORDER — DEXAMETHASONE 4 MG PO TABS
8.0000 mg | ORAL_TABLET | Freq: Two times a day (BID) | ORAL | Status: DC
  Administered 2017-07-02 – 2017-07-03 (×2): 8 mg via ORAL
  Filled 2017-07-02 (×2): qty 2

## 2017-07-02 NOTE — Progress Notes (Signed)
PT Cancellation Note  Patient Details Name: Cory Ibarra MRN: [id number] DOB: 1960/12/09   Cancelled Treatment:    Reason Eval/Treat Not Completed: Patient at procedure or test/unavailable (pt at radiation. Will follow. )   Philomena Doheny 07/02/2017, 9:34 AM [phone]

## 2017-07-02 NOTE — Progress Notes (Signed)
Physical Therapy Treatment Patient Details Name: Cory Ibarra MRN: [id number] DOB: 1961-06-22 Today's Date: 07/02/2017    History of Present Illness Cory Ibarra a 56 y.o.malewith medical history significant of progressive worsening back/L leg pain and loss of appetite. Treated for sciatica recently. Increased pain of left leg. CT revealed L3 burst fracture with retropulsion, bony mets and mass lesion about the aorta.     PT Comments    Pt ambulated 800' with RW. He stated RW helps him feel more steady, so PT now recommending RW for home. Reviewed technique to don/doff back brace, pt demonstrates understanding. PT goals met, will sign off.   Follow Up Recommendations  No PT follow up     Equipment Recommendations  Rolling walker with 5" wheels    Recommendations for Other Services       Precautions / Restrictions Precautions Precautions: Fall Required Braces or Orthoses: Spinal Brace Spinal Brace: Thoracolumbosacral orthotic;Applied in standing position Other Brace/Splint: patient up ad lib in room without the brace in place. encouraged patient to wear when up. Restrictions Weight Bearing Restrictions: No    Mobility  Bed Mobility Overal bed mobility: Modified Independent             General bed mobility comments: with bedrails  Transfers Overall transfer level: Independent Equipment used: None                Ambulation/Gait Ambulation/Gait assistance: Modified independent (Device/Increase time) Ambulation Distance (Feet): 800 Feet Assistive device: Rolling walker (2 wheeled) Gait Pattern/deviations: WFL(Within Functional Limits)   Gait velocity interpretation: at or above normal speed for age/gender General Gait Details: assisted with don/doff brace in standing, no LOB, feels more steady with RW   Stairs            Wheelchair Mobility    Modified Rankin (Stroke Patients Only)       Balance Overall balance assessment: Independent                                           Cognition Arousal/Alertness: Awake/alert Behavior During Therapy: WFL for tasks assessed/performed Overall Cognitive Status: Within Functional Limits for tasks assessed                                        Exercises      General Comments        Pertinent Vitals/Pain Pain Assessment: No/denies pain    Home Living                      Prior Function            PT Goals (current goals can now be found in the care plan section) Acute Rehab PT Goals Patient Stated Goal: to beat this cancer PT Goal Formulation: All assessment and education complete, DC therapy Time For Goal Achievement: 07/08/17 Potential to Achieve Goals: Good Additional Goals Additional Goal #1: don/doff TLSO independently Progress towards PT goals: Goals met/education completed, patient discharged from PT    Frequency    Min 2X/week      PT Plan Current plan remains appropriate    Co-evaluation              AM-PAC PT "6 Clicks" Daily Activity  Outcome Measure  Difficulty turning over in  bed (including adjusting bedclothes, sheets and blankets)?: None Difficulty moving from lying on back to sitting on the side of the bed? : None Difficulty sitting down on and standing up from a chair with arms (e.g., wheelchair, bedside commode, etc,.)?: None Help needed moving to and from a bed to chair (including a wheelchair)?: None Help needed walking in hospital room?: None Help needed climbing 3-5 steps with a railing? : None 6 Click Score: 24    End of Session Equipment Utilized During Treatment: Back brace Activity Tolerance: Patient tolerated treatment well Patient left: in chair;with call bell/phone within reach Nurse Communication: Mobility status PT Visit Diagnosis: Difficulty in walking, not elsewhere classified (R26.2);Pain Pain - part of body:  (back)     Time: 1068-1661 PT Time Calculation (min) (ACUTE  ONLY): 21 min  Charges:  $Gait Training: 8-22 mins                    G Codes:          Philomena Doheny 07/02/2017, 12:06 PM [phone]

## 2017-07-02 NOTE — Telephone Encounter (Signed)
[facility] [address] for bed 1340, asked Patricia Nettle, if patient needs to have pain med pleas give ,his Ct Simulation in [employer].Onc is for 9am, will be lying on hard table for about 1 hour, asked how he can travel , bed was told, thanked RN< RN stated he would medicate patient for pain, informed amy,RT Ct sim therapist 8:21 AM

## 2017-07-02 NOTE — Progress Notes (Signed)
PROGRESS NOTE    Antonios Ostrow  WJX:[id number] DOB: 1961-09-03 DOA: 06/28/2017 PCP: Patient, No Pcp Per  Brief Narrative:Decari Gladden is a 56 y.o. male with medical history significant of progressive worsening back/L leg pain and loss of appetite for about 6 weeks now.    About 6 weeks ago he started with vague abdominal discomfort foll by pain in the left hip and left knee pain.  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 DC thereafter, will d/w dr.Magrinat  DVT prophylaxis: lovenox Code Status: Full Code Family Communication: None at bedside Disposition Plan: Home later today or tomorrow  Consultants:   Onc  NSG  IR    Subjective: Pain much improved, continues to move more  Objective: Vitals:   07/01/17 0436 07/01/17 1349 07/01/17 2200 07/02/17 0544  BP: (!) 149/82 135/72 (!) 141/85 (!) 158/92  Pulse: 85 90 96 82  Resp: 16 16 18 18   Temp: 98 F (36.7 C) 98.5 F (36.9 C) 98.1 F (36.7 C) 97.7 F (36.5 C)  TempSrc: Oral Oral Oral Oral  SpO2: 99% 100% 98% 96%  Weight:        Height:        Intake/Output Summary (Last 24 hours) at 07/02/17 1149 Last data filed at 07/02/17 0845  Gross per 24 hour  Intake             1360 ml  Output                0 ml  Net             1360 ml   Filed Weights   06/28/17 1500  Weight: 91.9 kg (202 lb 8 oz)    Examination:  Gen: Awake, Alert, Oriented X 3,  HEENT: PERRLA, Neck supple, no JVD Lungs: Good air movement bilaterally, CTAB CVS: RRR,No Gallops,Rubs or new Murmurs Abd: soft, Non tender, non distended, BS present Extremities: No Cyanosis, Clubbing or edema Skin: no new rashes Neuro: strength 5/5, sensory slightly decreased light touch over L knee   Data Reviewed:   CBC:  Recent Labs Lab 06/28/17 0919 06/29/17 0351  WBC 7.8 6.7  NEUTROABS 5.7  --   HGB 14.1 13.7  HCT 40.7 40.4  MCV 75.1* 75.1*  PLT 332 213   Basic Metabolic Panel:  Recent Labs Lab 06/28/17 0919 06/29/17 0351  NA 138 139  K 4.1 4.1  CL 104 103  CO2 23 27  GLUCOSE 108* 130*  BUN 14 20  CREATININE 0.84 0.70  CALCIUM 9.1 10.0   GFR: Estimated Creatinine Clearance: 117 mL/min (by C-G formula based on SCr of 0.7 mg/dL). Liver Function Tests:  Recent  Labs Lab 06/28/17 0919  AST 45*  ALT 30  ALKPHOS 101  BILITOT 1.6*  PROT 7.6  ALBUMIN 3.9   No results for input(s): LIPASE, AMYLASE in the last 168 hours. No results for input(s): AMMONIA in the last 168 hours. Coagulation Profile:  Recent Labs Lab 06/29/17 0847  INR 1.06   Cardiac Enzymes: No results for input(s): CKTOTAL, CKMB, CKMBINDEX, TROPONINI in the last 168 hours. BNP (last 3 results) No results for input(s): PROBNP in the last 8760 hours. HbA1C: No results for input(s): HGBA1C in the last 72 hours. CBG: No results for input(s): GLUCAP in the last 168 hours. Lipid Profile: No results for input(s): CHOL, HDL, LDLCALC, TRIG, CHOLHDL, LDLDIRECT in the last 72 hours. Thyroid Function Tests: No results for input(s): TSH, T4TOTAL, FREET4, T3FREE,  THYROIDAB in the last 72 hours. Anemia Panel: No results for input(s): VITAMINB12, FOLATE, FERRITIN, TIBC, IRON, RETICCTPCT in the last 72 hours. Urine analysis:    Component Value Date/Time   COLORURINE YELLOW 06/28/2017 [place] 06/28/2017 0937   LABSPEC 1.020 06/28/2017 0937   PHURINE 5.0 06/28/2017 0937   GLUCOSEU NEGATIVE 06/28/2017 0937   HGBUR SMALL (A) 06/28/2017 0937   BILIRUBINUR NEGATIVE 06/28/2017 0937   KETONESUR NEGATIVE 06/28/2017 0937   PROTEINUR NEGATIVE 06/28/2017 0937   NITRITE NEGATIVE 06/28/2017 0937   LEUKOCYTESUR NEGATIVE 06/28/2017 0937   Sepsis Labs: @LABRCNTIP (procalcitonin:4,lacticidven:4)  )No results found for this or any previous visit (from the past 240 hour(s)).       Radiology Studies: No results found.      Scheduled Meds: . dexamethasone  8 mg Intravenous Q12H  . enoxaparin (LOVENOX) injection  40 mg Subcutaneous Q24H  . pantoprazole  40 mg Oral Q1200  . sodium chloride flush  3 mL Intravenous Q12H   Continuous Infusions: . sodium chloride       LOS: 4 days    Time spent: 64min    Domenic Polite, MD [facility] Pager [phone]  If 7PM-7AM, please contact night-coverage [email or web address] Password [email or web address] 07/02/2017, 11:49 AM

## 2017-07-03 ENCOUNTER — Ambulatory Visit
Admit: 2017-07-03 | Discharge: 2017-07-03 | Disposition: A | Discharge status: Home / Self Care | Payer: Medicaid Other | Attending: Radiation Oncology | Admitting: Radiation Oncology

## 2017-07-03 DIAGNOSIS — C8330 Diffuse large B-cell lymphoma, unspecified site: Secondary | ICD-10-CM | POA: Diagnosis present

## 2017-07-03 MED ORDER — OXYCODONE HCL 5 MG PO TABS: 5.0000 mg | ORAL_TABLET | Freq: Four times a day (QID) | ORAL | 0 refills | Status: DC | PRN

## 2017-07-03 MED ORDER — DEXAMETHASONE 4 MG PO TABS: 4.0000 mg | ORAL_TABLET | Freq: Two times a day (BID) | ORAL | 0 refills | Status: DC

## 2017-07-03 MED ORDER — POLYETHYLENE GLYCOL 3350 17 G PO PACK: 17.0000 g | PACK | Freq: Every day | ORAL | 0 refills | Status: DC | PRN

## 2017-07-03 MED ORDER — PANTOPRAZOLE SODIUM 40 MG PO TBEC: 40.0000 mg | DELAYED_RELEASE_TABLET | Freq: Every day | ORAL | 0 refills | Status: DC

## 2017-07-04 ENCOUNTER — Ambulatory Visit
Admission: RE | Admit: 2017-07-04 | Discharge: 2017-07-04 | Disposition: A | Discharge status: Home / Self Care | Payer: Medicaid Other | Source: Ambulatory Visit | Attending: Radiation Oncology | Admitting: Radiation Oncology

## 2017-07-04 DIAGNOSIS — C801 Malignant (primary) neoplasm, unspecified: Secondary | ICD-10-CM | POA: Diagnosis not present

## 2017-07-04 DIAGNOSIS — Z510 Encounter for antineoplastic radiation therapy: Secondary | ICD-10-CM | POA: Diagnosis not present

## 2017-07-04 DIAGNOSIS — C7951 Secondary malignant neoplasm of bone: Secondary | ICD-10-CM | POA: Insufficient documentation

## 2017-07-05 ENCOUNTER — Ambulatory Visit
Admission: RE | Admit: 2017-07-05 | Discharge: 2017-07-05 | Disposition: A | Discharge status: Home / Self Care | Payer: Medicaid Other | Source: Ambulatory Visit | Attending: Radiation Oncology | Admitting: Radiation Oncology

## 2017-07-05 ENCOUNTER — Telehealth: Payer: Self-pay | Admitting: *Deleted

## 2017-07-05 DIAGNOSIS — R222 Localized swelling, mass and lump, trunk: Secondary | ICD-10-CM

## 2017-07-05 DIAGNOSIS — Z510 Encounter for antineoplastic radiation therapy: Secondary | ICD-10-CM | POA: Diagnosis not present

## 2017-07-05 MED ORDER — PROCHLORPERAZINE MALEATE 10 MG PO TABS
10.0000 mg | ORAL_TABLET | Freq: Four times a day (QID) | ORAL | Status: DC | PRN
  Administered 2017-07-05: 10 mg via ORAL
  Filled 2017-07-05: qty 1

## 2017-07-05 NOTE — Progress Notes (Signed)
Patient c/o nausea after rad txs requesting medication,  Per verbal order gave 10mg  tablet compazine oral to the patient will ask MD to fill rx at [facility] for the patient, gave my business card with my desk phone number, patient thanked this Rn for the nausea medication, called [facility], spoke with Maudie Mercury, pharmacist,  To take compazine 10mg  tablet 1 orlly every 8 hours as needed for nausea, dispense 40 tabs, no refill per verbal order Dr. Lisbeth Renshaw, Maudie Mercury repeated verification Will call patient when ready 2:50 PM

## 2017-07-05 NOTE — Telephone Encounter (Signed)
error 

## 2017-07-06 ENCOUNTER — Encounter: Payer: Self-pay | Admitting: Family Medicine

## 2017-07-06 ENCOUNTER — Ambulatory Visit
Admission: RE | Admit: 2017-07-06 | Discharge: 2017-07-06 | Disposition: A | Discharge status: Home / Self Care | Payer: Medicaid Other | Source: Ambulatory Visit | Attending: Radiation Oncology | Admitting: Radiation Oncology

## 2017-07-06 ENCOUNTER — Ambulatory Visit (INDEPENDENT_AMBULATORY_CARE_PROVIDER_SITE_OTHER): Payer: Self-pay | Admitting: Family Medicine

## 2017-07-06 VITALS — BP 127/88 | HR 100 | Temp 98.4°F | Resp 14 | Ht 74.0 in | Wt 194.6 lb

## 2017-07-06 DIAGNOSIS — C7951 Secondary malignant neoplasm of bone: Secondary | ICD-10-CM

## 2017-07-06 DIAGNOSIS — C8330 Diffuse large B-cell lymphoma, unspecified site: Secondary | ICD-10-CM

## 2017-07-06 DIAGNOSIS — G893 Neoplasm related pain (acute) (chronic): Secondary | ICD-10-CM

## 2017-07-06 DIAGNOSIS — Z510 Encounter for antineoplastic radiation therapy: Secondary | ICD-10-CM | POA: Diagnosis not present

## 2017-07-06 LAB — POCT URINALYSIS DIP (DEVICE)
Glucose, UA: NEGATIVE mg/dL
Ketones, ur: 15 mg/dL — AB
Leukocytes, UA: NEGATIVE
NITRITE: NEGATIVE
PH: 5.5 (ref 5.0–8.0)
Protein, ur: NEGATIVE mg/dL
Specific Gravity, Urine: 1.025 (ref 1.005–1.030)
UROBILINOGEN UA: 1 mg/dL (ref 0.0–1.0)

## 2017-07-06 NOTE — Patient Instructions (Signed)

## 2017-07-06 NOTE — Progress Notes (Signed)
Patient ID: Cory Ibarra, male    DOB: 04/26/1961, 56 y.o.   MRN: [id number]  PCP: [name], [name]  Chief Complaint  Patient presents with  . Hospitalization Follow-up  . Establish Care    Subjective:  HPI Cory Ibarra is a 56 y.o. male presents to establish care and hospital follow-up. Medical problems include: B Cell Lymphoma, retroperitoneal pain, bone metastasis (new diagnosis). Cory Ibarra originally presented to the ED on 06/18/2017 with the complaint of left-sided back pain with sciatica symptoms. He was prescribed cyclobenzaprine, prednisone, and naproxen and released. On 06/28/2017 Cory Ibarra reports that his pain got significantly worse to the point that he could not walk. His pain was a 10/10 and included his entire back abdominal region and pelvic region. During this admission to the ED a CT study was done evaluating for renal stone. The CT revealed multiple aggressive osseous lesions of the soft tissue involving the right anterior chest wall and pelvis. He was also 2 large retroperitoneal soft tissue masses noted in the left periaortic region. There was significant concern from metastatic disease and/or myeloma. He later underwent an MRI of the thoracic spine which confirmed L3 fracture, metastatic lesions of the spine, and confirmed multiple retroperitoneal soft tissue masses. He underwent a biopsy and pathology was significant for non-Hodgkin's lymphoma, sister with B cell lymphoma. His has already established care with oncology services and initiated radiation treatment. He reports that he is tolerating treatments fine. He is only in need of pain medication today. He does report that his pain has significantly decreased compared to what it was when he presented to the ED on 06/28/2017. His appetite has also improved and he reports that he is in much better spirits now he is coming to terms to what his diagnosis is. He has very strong family support from siblings. Social History   Social  History  . Marital status: Single    Spouse name: N/A  . Number of children: N/A  . Years of education: N/A   Occupational History  . Not on file.   Social History Main Topics  . Smoking status: Current Every Day Smoker    Types: E-cigarettes  . Smokeless tobacco: Never Used  . Alcohol use Yes     Comment: occasionally  . Drug use: No  . Sexual activity: No   Other Topics Concern  . Not on file   Social History Narrative  . No narrative on file    Family History  Problem Relation Age of Onset  . Cancer Father    Review of Systems  Constitutional: Negative.   Eyes: Negative.   Respiratory: Negative.   Cardiovascular: Negative.   Gastrointestinal: Negative.   Genitourinary: Negative.   Musculoskeletal: Positive for arthralgias and back pain.  Neurological: Negative.   Hematological: Negative.   Psychiatric/Behavioral: Negative.     Patient Active Problem List   Diagnosis Date Noted  . 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 Bony metastasis ([place]) 06/29/2017  . Chest wall mass   . 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 Closed compression fracture of L3 lumbar vertebra ([place]) 06/28/2017  . Severe back pain 06/28/2017  . Weight loss, unintentional 06/28/2017    Allergies  Allergen Reactions  . Bee Venom Swelling    Prior to Admission medications   Medication Sig Start Date End Date Taking? Authorizing Provider  dexamethasone (DECADRON) 4 MG tablet Take 1 tablet (4 mg total) by mouth 2 (two) times daily. 07/03/17  Yes Domenic Polite, MD  oxyCODONE (OXY IR/ROXICODONE) 5 MG immediate release tablet Take 1 tablet (5 mg total) by mouth every 6 (six) hours as needed for moderate pain or severe pain. 07/03/17  Yes Domenic Polite, MD  pantoprazole (PROTONIX) 40 MG tablet Take 1 tablet (40 mg total) by mouth daily at 12 noon. 07/03/17  Yes Domenic Polite, MD  polyethylene glycol [facility] / Floria Raveling) packet Take 17 g by mouth daily as needed for mild constipation.  07/03/17  Yes Domenic Polite, MD  prochlorperazine (COMPAZINE) 10 MG tablet Take 10 mg by mouth every 8 (eight) hours as needed for nausea or vomiting. Dispense 40 tablets, no reill 07/05/17  Yes Kyung Rudd, MD    Past Medical, Surgical Family and Social History reviewed and updated.    Objective:   Today's Vitals   07/06/17 1016  BP: 127/88  Pulse: 100  Resp: 14  Temp: 98.4 F (36.9 C)  TempSrc: Oral  SpO2: 97%  Weight: 194 lb 9.6 oz (88.3 kg)  Height: 6\' 2"  (1.88 m)    Wt Readings from Last 3 Encounters:  07/06/17 194 lb 9.6 oz (88.3 kg)  07/03/17 203 lb 0.7 oz (92.1 kg)  06/18/17 230 lb (104.3 kg)   Physical Exam  Constitutional: He is oriented to person, place, and time. He appears well-developed and well-nourished.  HENT:  Head: Normocephalic and atraumatic.  Eyes: Pupils are equal, round, and reactive to light. Conjunctivae and EOM are normal.  Neck: Normal range of motion. Neck supple.  Cardiovascular: Normal rate, regular rhythm, normal heart sounds and intact distal pulses.   Pulmonary/Chest: Effort normal and breath sounds normal.  Abdominal: Soft. Bowel sounds are normal. He exhibits no distension. There is no tenderness. There is no rebound and no guarding.  Musculoskeletal: Normal range of motion.  Neurological: He is alert and oriented to person, place, and time.  Skin: Skin is warm and dry.  Psychiatric: He has a normal mood and affect. His behavior is normal. Judgment and thought content normal.   Assessment & Plan:  1. Diffuse large B-cell lymphoma, unspecified body region ([place]) 2. Bony metastasis ([place]) 3. Chronic pain due to neoplasm -Continue follow-up with oncology. -For moderate to severe pain start oxycodone 5 mg every 6 hours as needed.  RTC: 2 months for chronic pain follow-up  Carroll Sage. Kenton Kingfisher, MSN, [name]-C [facility]  [address] Barbara Cower [place], [place] [address] [phone]

## 2017-07-07 NOTE — Progress Notes (Signed)
  Radiation Oncology         [phone] ________________________________  Name: Cory Ibarra MRN: [id number]  Date: 07/02/2017  DOB: February 05, 1961  SIMULATION AND TREATMENT PLANNING NOTE  DIAGNOSIS:     ICD-10-CM   1. 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  Identity was confirmed.  All relevant records and images related to the planned course of therapy were reviewed.   Written consent to proceed with treatment was confirmed which was freely given after reviewing the details related to the planned course of therapy had been reviewed with the patient.  Then, the patient was set-up in a stable reproducible  supine position for radiation therapy.  CT images were obtained.  Surface markings were placed.    Medically necessary complex treatment device(s) for immobilization:  Customized vac lock bag.   The CT images were loaded into the planning software.  Then the target and avoidance structures were contoured.  Treatment planning then occurred.  The radiation prescription was entered and confirmed.  A total of 2 complex treatment devices were fabricated which relate to the designed radiation treatment fields. A reduced field technique will be used if necessary to improve the dose homogeneity of the plan. Each of these customized fields/ complex treatment devices will be used on a daily basis during the radiation course. I have requested : 3D conformal technique.  A target volume has been contoured in addition to the left kidney, right kidney, and spinal cord. Dose volume histograms of each of these structures will be reviewed carefully as part of the treatment planning process.  PLAN:  The patient will receive 25 Gy in 10 fractions.  ________________________________   Jodelle Gross, MD, PhD

## 2017-07-08 ENCOUNTER — Other Ambulatory Visit: Payer: Self-pay | Admitting: Oncology

## 2017-07-09 ENCOUNTER — Ambulatory Visit
Admission: RE | Admit: 2017-07-09 | Discharge: 2017-07-09 | Disposition: A | Discharge status: Home / Self Care | Payer: Medicaid Other | Source: Ambulatory Visit | Attending: Radiation Oncology | Admitting: Radiation Oncology

## 2017-07-09 DIAGNOSIS — Z510 Encounter for antineoplastic radiation therapy: Secondary | ICD-10-CM | POA: Diagnosis not present

## 2017-07-10 ENCOUNTER — Ambulatory Visit
Admission: RE | Admit: 2017-07-10 | Discharge: 2017-07-10 | Disposition: A | Discharge status: Home / Self Care | Payer: Medicaid Other | Source: Ambulatory Visit | Attending: Radiation Oncology | Admitting: Radiation Oncology

## 2017-07-10 ENCOUNTER — Telehealth: Payer: Self-pay | Admitting: Hematology and Oncology

## 2017-07-10 ENCOUNTER — Ambulatory Visit: Payer: Self-pay | Admitting: Oncology

## 2017-07-10 DIAGNOSIS — Z510 Encounter for antineoplastic radiation therapy: Secondary | ICD-10-CM | POA: Diagnosis not present

## 2017-07-10 NOTE — Telephone Encounter (Signed)
Spoke with patient regarding the changes in his appointment on Monday.

## 2017-07-11 ENCOUNTER — Ambulatory Visit
Admission: RE | Admit: 2017-07-11 | Discharge: 2017-07-11 | Disposition: A | Discharge status: Home / Self Care | Payer: Medicaid Other | Source: Ambulatory Visit | Attending: Radiation Oncology | Admitting: Radiation Oncology

## 2017-07-11 DIAGNOSIS — Z510 Encounter for antineoplastic radiation therapy: Secondary | ICD-10-CM | POA: Diagnosis not present

## 2017-07-12 ENCOUNTER — Ambulatory Visit
Admission: RE | Admit: 2017-07-12 | Discharge: 2017-07-12 | Disposition: A | Discharge status: Home / Self Care | Payer: Medicaid Other | Source: Ambulatory Visit | Attending: Radiation Oncology | Admitting: Radiation Oncology

## 2017-07-12 ENCOUNTER — Encounter: Payer: Self-pay | Admitting: Family Medicine

## 2017-07-12 DIAGNOSIS — Z510 Encounter for antineoplastic radiation therapy: Secondary | ICD-10-CM | POA: Diagnosis not present

## 2017-07-12 NOTE — Discharge Summary (Signed)
Physician Discharge Summary  Cory Ibarra ZOX:[id number] DOB: [birth year] DOA: 06/28/2017  PCP: Cory Jun, [name]  Admit date: 06/28/2017 Discharge date: 07/03/2017  Time spent: 35 minutes  Recommendations for Outpatient Follow-up:  1. Rad Onc Dr.Moody for XRT treatments, consider cutting down and weaning decadron at Follow up 2. Oncology Dr.Magrinat 8/3   Discharge Diagnoses:  Principal Problem:   Closed compression fracture of L3 lumbar vertebra (HCC) Active Problems:   Retroperitoneal mass   Severe back pain   Weight loss, unintentional   Chest wall mass   Diffuse large B cell lymphoma ([place])   Discharge Condition: stable  Diet recommendation: regular  Filed Weights   06/28/17 1500 07/03/17 0606  Weight: 91.9 kg (202 lb 8 oz) 92.1 kg (203 lb 0.7 oz)    History of present illness:  Cory Ibarra a 56 y.o.malewith medical history significant of progressive worsening back/L leg pain and loss of appetite for about 6 weeks now.  About 6 weeks ago he started with vague abdominal discomfort foll by pain in the left hip and left knee pain. He came to ED over a week ago, was diagnosed with sciatica given steroids, briefly improved and worsened again, back to ER 7/26, CT notable for large retroperitoneal mass with multiple bony mets of the thoracic spine, L3 burst fracture/masses  Hospital Course:   Large retroperitoneal masses with multiple bony metastasis involving thoracic spine, L3 burst fracture -no s/s of cord compromise at this time -has some radiculopathy related to L3 burst fracture -Seen by Oncology Dr.Magrinat, NSG consulted, and Rad Onc consult appreciated, -IR consulted, s/p biopsy of chest wall lesion 7/27, started on IV Decadron, and oxycodone with good benefit -PT consult completed, symptoms significantly improved -FInally biopsy back and notes Diffuse large B cell lymphoma -pt will continue XRT to thoracic spine and FU with Dr.Magrinat in 3days at the [facility]  Consultations:  Oncology  Rad [place]  Neurosurgery  Discharge Exam: Vitals:   07/03/17 0606 07/03/17 1413  BP: (!) 154/91 (!) 141/96  Pulse: 75 98  Resp: 17 19  Temp: 97.9 F (36.6 C) (!) 97.4 F (36.3 C)  SpO2: 100% 94%    General: AAOx3 Cardiovascular: S1S2/RRR Respiratory: CTAB  Discharge Instructions   Discharge Instructions    Diet - low sodium heart healthy    Complete by:  As directed    Increase activity slowly    Complete by:  As directed      Discharge Medication List as of 07/03/2017  3:20 PM    START taking these medications   Details  dexamethasone (DECADRON) 4 MG tablet Take 1 tablet (4 mg total) by mouth 2 (two) times daily., Starting Tue 07/03/2017, Print    oxyCODONE (OXY IR/ROXICODONE) 5 MG immediate release tablet Take 1 tablet (5 mg total) by mouth every 6 (six) hours as needed for moderate pain or severe pain., Starting Tue 07/03/2017, Print    pantoprazole (PROTONIX) 40 MG tablet Take 1 tablet (40 mg total) by mouth daily at 12 noon., Starting Tue 07/03/2017, Print    polyethylene glycol (MIRALAX / GLYCOLAX) packet Take 17 g by mouth daily as needed for mild constipation., Starting Tue 07/03/2017, Print      STOP taking these medications     ibuprofen (ADVIL,MOTRIN) 200 MG tablet      naproxen sodium (ANAPROX) 220 MG tablet      cyclobenzaprine (FLEXERIL) 10 MG tablet      naproxen (NAPROSYN) 375 MG tablet  predniSONE (DELTASONE) 50 MG tablet      traMADol (ULTRAM) 50 MG tablet        Allergies  Allergen Reactions  . Bee Venom Swelling   Follow-up Information    [facility] Follow up on 07/06/2017.   Specialty:  Internal Medicine Why:  appointment at 10:30 am Contact information: [place] [phone]           The results of significant diagnostics from this hospitalization (including imaging, microbiology, ancillary and laboratory) are listed below  for reference.    Significant Diagnostic Studies: Dg Lumbar Spine Complete  Result Date: 06/18/2017 CLINICAL DATA:  Back pain. EXAM: LUMBAR SPINE - COMPLETE 4+ VIEW COMPARISON:  None. FINDINGS: There is no evidence of lumbar spine fracture. Alignment is normal. Intervertebral disc spaces are maintained. There is a calcification noted within the right lower quadrant of the abdomen which may represent an at appendicolith. IMPRESSION: Unremarkable appearance of the lumbar spine. Electronically Signed   By: Kerby Moors M.D.   On: 06/18/2017 09:14   Dg Knee 2 Views Left  Result Date: 06/28/2017 CLINICAL DATA:  Left hip and leg pain which is worsening. EXAM: LEFT KNEE - 1-2 VIEW COMPARISON:  None. FINDINGS: No joint effusion. No joint space narrowing. No significant osteophytes. No sign of avascular necrosis. Benign sclerotic focus in the posterior aspect of the proximal tibia, not significant. IMPRESSION: Negative.  No cause of pain identified. Electronically Signed   By: Nelson Chimes M.D.   On: 06/28/2017 09:59   Mr Thoracic Spine W Wo Contrast  Result Date: 06/28/2017 CLINICAL DATA:  multiple osseous lesions.  Possible myeloma. EXAM: MRI THORACIC AND LUMBAR SPINE WITHOUT AND WITH CONTRAST TECHNIQUE: Multiplanar and multiecho pulse sequences of the thoracic and lumbar spine were obtained without and with intravenous contrast. CONTRAST:  65mL MULTIHANCE GADOBENATE DIMEGLUMINE 529 MG/ML IV SOLN COMPARISON:  CT abdomen pelvis 06/28/2017 FINDINGS: MRI THORACIC SPINE FINDINGS Alignment:  Normal Vertebrae: There are multiple osseous lesions, as seen on the concomitant CT: -- T2, left transverse process -- T6, inferior vertebral body, small lesion -- T7, large lesion involving most of the vertebral body -- T8, large lesion of the left pedicle extending into the left T7-T8 foramen and involving the most proximal aspect of the adjacent rib -- T10, large lesion involving most of the vertebral body. Mild superior  endplate compression. --T12, large lesion centered within the right pedicle, involving the posterior vertebral body and lateral aspect of the right lamina, as well as the adjacent proximal rib. This mass also invades the T11-12 and T12-L1 neural foramina. Cord:  Normal signal and morphology. Paraspinal and other soft tissues: No visible paraspinal or retroperitoneal abnormality. Please see dedicated report for concomitant CT of the abdomen and pelvis. Disc levels: T7-T8: The left neural foramen is severely narrowed by the adjacent soft tissue mass. No central spinal canal or right neural foraminal stenosis. T11-T12: Mild narrowing of the right neural foramen secondary to adjacent soft tissue mass. MRI LUMBAR SPINE FINDINGS Segmentation:  Normal Alignment:  Normal Vertebrae: There is a a large lesion of the L3 vertebral body with an associated burst fracture with approximately 50% height loss and retropulsion measuring 6 mm. There is a small lesion of the right L4 pedicle. Conus medullaris: Extends to the lower L1 level and appears normal. Paraspinal and other soft tissues: Left prevertebral mass at the L1 level measures 3.1 x 1.6 x 3.9 cm. Large para-  aortic mass at the level of the left renal vessels measures 5.8 x 4.0 x 9.4 cm. Disc levels: T12-L1: There is mild right foraminal stenosis due to invasion by the right pedicular soft tissue mass. L1-L2: Normal disc space and facets. No spinal canal or neuroforaminal stenosis. L2-L3: At the inferior aspect of the disc space, there is severe narrowing of the left lateral recess due to retropulsion of the superior endplate of L3. This displaces the descending nerve root. The soft tissue component of the L3 mass is in close proximity to the exiting left L2 root. At the mid L3 level, there is moderate spinal canal stenosis. L3-L4: Moderate left neural foraminal stenosis secondary to invasion by L3 mass. No spinal canal stenosis. L4-L5: Small central disc protrusion without  stenosis. L5-S1: Normal disc space and facets. No spinal canal or neuroforaminal stenosis. Visualized sacrum: Lesion within the right sacral ala measures up to 3.4 cm. IMPRESSION: 1. Pathologic burst fracture of the L3 vertebral body with 6 mm of retropulsion and approximately 50% height loss, resulting and severe left lateral recess narrowing at L2-L3, moderate spinal canal stenosis directly posterior to the L3 vertebral body and moderate left L3-4 neural foraminal stenosis. 2. 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 Neural foraminal invasion at T7-T8, T11-T12 and T12-L1. 3. Multiple retroperitoneal soft tissue masses. Electronically Signed   By: Ulyses Jarred M.D.   On: 06/28/2017 15:37   Mr Lumbar Spine W Wo Contrast  Result Date: 06/28/2017 CLINICAL DATA:  multiple osseous lesions.  Possible myeloma. EXAM: MRI THORACIC AND LUMBAR SPINE WITHOUT AND WITH CONTRAST TECHNIQUE: Multiplanar and multiecho pulse sequences of the thoracic and lumbar spine were obtained without and with intravenous contrast. CONTRAST:  57mL MULTIHANCE GADOBENATE DIMEGLUMINE 529 MG/ML IV SOLN COMPARISON:  CT abdomen pelvis 06/28/2017 FINDINGS: MRI THORACIC SPINE FINDINGS Alignment:  Normal Vertebrae: There are multiple osseous lesions, as seen on the concomitant CT: -- T2, left transverse process -- T6, inferior vertebral body, small lesion -- T7, large lesion involving most of the vertebral body -- T8, large lesion of the left pedicle extending into the left T7-T8 foramen and involving the most proximal aspect of the adjacent rib -- T10, large lesion involving most of the vertebral body. Mild superior endplate compression. --T12, large lesion centered within the right pedicle, involving the posterior vertebral body and lateral aspect of the right lamina, as well as the adjacent proximal rib. This mass also invades the T11-12 and T12-L1 neural foramina.  Cord:  Normal signal and morphology. Paraspinal and other soft tissues: No visible paraspinal or retroperitoneal abnormality. Please see dedicated report for concomitant CT of the abdomen and pelvis. Disc levels: T7-T8: The left neural foramen is severely narrowed by the adjacent soft tissue mass. No central spinal canal or right neural foraminal stenosis. T11-T12: Mild narrowing of the right neural foramen secondary to adjacent soft tissue mass. MRI LUMBAR SPINE FINDINGS Segmentation:  Normal Alignment:  Normal Vertebrae: There is a a large lesion of the L3 vertebral body with an associated burst fracture with approximately 50% height loss and retropulsion measuring 6 mm. There is a small lesion of the right L4 pedicle. Conus medullaris: Extends to the lower L1 level and appears normal. Paraspinal and other soft tissues: Left prevertebral mass at the L1 level measures 3.1 x 1.6 x 3.9 cm. Large para- aortic mass at the level of the left renal vessels measures 5.8  x 4.0 x 9.4 cm. Disc levels: T12-L1: There is mild right foraminal stenosis due to invasion by the right pedicular soft tissue mass. L1-L2: Normal disc space and facets. No spinal canal or neuroforaminal stenosis. L2-L3: At the inferior aspect of the disc space, there is severe narrowing of the left lateral recess due to retropulsion of the superior endplate of L3. This displaces the descending nerve root. The soft tissue component of the L3 mass is in close proximity to the exiting left L2 root. At the mid L3 level, there is moderate spinal canal stenosis. L3-L4: Moderate left neural foraminal stenosis secondary to invasion by L3 mass. No spinal canal stenosis. L4-L5: Small central disc protrusion without stenosis. L5-S1: Normal disc space and facets. No spinal canal or neuroforaminal stenosis. Visualized sacrum: Lesion within the right sacral ala measures up to 3.4 cm. IMPRESSION: 1. Pathologic burst fracture of the L3 vertebral body with 6 mm of  retropulsion and approximately 50% height loss, resulting and severe left lateral recess narrowing at L2-L3, moderate spinal canal stenosis directly posterior to the L3 vertebral body and moderate left L3-4 neural foraminal stenosis. 2. 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 Neural foraminal invasion at T7-T8, T11-T12 and T12-L1. 3. Multiple retroperitoneal soft tissue masses. Electronically Signed   By: Ulyses Jarred M.D.   On: 06/28/2017 15:37   [place] Core Biopsy  Result Date: 06/29/2017 CLINICAL DATA:  Destructive bone lesions including right anterior chest wall mass. EXAM: ULTRASOUND GUIDED CORE BIOPSY OF CHEST WALL MASS MEDICATIONS: 2.0 mg IV Versed; 100 mcg IV Fentanyl Total Moderate Sedation Time: 10 minutes The patient's level of consciousness and physiologic status were continuously monitored during the procedure by Radiology nursing. PROCEDURE: The procedure, risks, benefits, and alternatives were explained to the patient. Questions regarding the procedure were encouraged and answered. The patient understands and consents to the procedure. A time out was performed prior to initiating the procedure. The right anterior chest wall was prepped with chlorhexidine in a sterile fashion, and a sterile drape was applied covering the operative field. A sterile gown and sterile gloves were used for the procedure. Local anesthesia was provided with 1% Lidocaine. Ultrasound was used to localize a right anterior chest wall mass. A 17 gauge needle was advanced to the medial margin of the mass. Five separate 18 gauge core biopsy samples were then obtained through the lesion. Four samples were submitted in formalin and 1 sample submitted in saline. Post biopsy imaging was performed with ultrasound. COMPLICATIONS: None. FINDINGS: Lobulated and hypoechoic soft tissue mass is present in the right anterior chest wall. This measures at least 6 cm  in greatest diameter. Solid tissue was obtained. IMPRESSION: Ultrasound-guided core biopsy performed of a right anterior chest wall mass. Electronically Signed   By: Aletta Edouard M.D.   On: 06/29/2017 16:02   Dg Chest Port 1 View  Result Date: 06/28/2017 CLINICAL DATA:  Evaluate for possible metastatic disease. EXAM: PORTABLE CHEST 1 VIEW COMPARISON:  None. FINDINGS: Cardiac shadow is within normal limits. There is a vague somewhat nodular density identified in the mid left lung. This may be related to a rib lesion as seen on recent MRI examination. No other focal abnormality is noted. IMPRESSION: Vague density in the left mid lung which likely represents a rib lesion as seen on recent MRI. CT of the chest is recommended for further evaluation. Electronically Signed   By: Elta [place]  Lukens M.D.   On: 06/28/2017 18:47   Ct Renal Stone Study  Result Date: 06/28/2017 CLINICAL DATA:  Patient with left flank pain.  Difficulty standing. EXAM: CT ABDOMEN AND PELVIS WITHOUT CONTRAST TECHNIQUE: Multidetector CT imaging of the abdomen and pelvis was performed following the standard protocol without IV contrast. COMPARISON:  None. FINDINGS: Lower chest: Normal heart size. Dependent atelectasis within the bilateral lower lobes. Hepatobiliary: Liver is normal in size and contour. Gallbladder is unremarkable. Pancreas: Unremarkable Spleen: Unremarkable Adrenals/Urinary Tract: The adrenal glands are normal. Bilateral renal stones, right-greater-than-left, including a 9 mm stone inferior pole right kidney. Punctate stone identified within the inferior pole of the left kidney. There is a nonobstructing 8 mm stone within the mid aspect of the right kidney (image 45; series 2). Mild left pelviectasis. Urinary bladder is unremarkable. Stomach/Bowel: Small hiatal hernia. Normal morphology of the stomach. No evidence for bowel obstruction. No free fluid or free intraperitoneal air. Normal appendix. Vascular/Lymphatic: Normal  caliber abdominal aorta. There is a 6.7 x 5.1 cm soft tissue mass within the left periaortic location (image 35; series 2). Anteriorly there is an additional 3.6 x 3.3 cm left periaortic soft tissue mass (image 44; series 2). Reproductive: Mildly enlarged prostate. Other: Fat containing left inguinal hernia. Musculoskeletal: There is a 5.6 x 3.9 cm soft tissue mass interspersed between the anterior right sixth and seventh ribs, incompletely included. Patchy lucencies demonstrated within the T10 vertebral body extending to the right posterior elements (image 96; series 6). Mild superior endplate compression deformity at this level. There is a soft tissue mass involving the right posterior elements at the T12 vertebral level which measures 3.9 x 3.6 cm with associated osseous destruction. Soft tissue mass extends into the canal at this level (image 21; series 2). Small oval lucent lesion within the posterior L1 vertebral body. Patchy lucency throughout the L3 vertebral body which is collapsed involving both the superior and inferior endplates. Soft tissue mass extends into the left aspect of the posterior elements as well as into the spinal canal with associated marked narrowing. Soft tissue mass at this location measures up to 4 cm. Patchy lucencies demonstrated within the pubic symphysis bilaterally (image 84; series 2). Lucent lesion within the right ilium with associated soft tissue mass (image 58; series 2). IMPRESSION: Multiple aggressive osseous lesions with associated soft tissue masses demonstrated within the lower thoracic and lumbar spine as well as pelvis and right anterior chest wall. Additionally there are two large retroperitoneal soft tissue masses within the left periaortic location. Overall findings are concerning for metastatic disease or myeloma. Osseous destruction and soft tissue mass involving the L3 vertebral body with approximately 75% collapse at this level. The mass extends to the posterior  elements and into the spinal canal were there is marked of narrowing. Osseous destruction of the posterior elements at the T12 vertebral level with extension of the soft tissue mass into the canal at this level. Aggressive osseous lesion involving the posterior T10 vertebral body extending to the posterior elements. Given these extensive findings involving the spine and spinal canal, consider further evaluation with MRI of the thoracic and lumbar spine. Mild left pelviectasis, potentially secondary to mass effect from the large adjacent retroperitoneal mass. Bilateral nephrolithiasis. Nonobstructing 8 mm stone within the mid right ureter. These results were called by telephone at the time of interpretation on 06/28/2017 at 11:24 am to Dr. Nanda Quinton , who verbally acknowledged these results. Electronically Signed   By: Polly Cobia.D.  On: 06/28/2017 11:30   Dg Hip Unilat W Or Wo Pelvis 2-3 Views Left  Result Date: 06/28/2017 CLINICAL DATA:  Worsening low back and left hip pain. EXAM: DG HIP (WITH OR WITHOUT PELVIS) 2-3V LEFT COMPARISON:  None. FINDINGS: Bones of the pelvis appear normal. Sacroiliac joints and symphysis pubis appear normal. Both hip joints appear normal. No evidence of osteoarthritis, avascular necrosis or other finding. Phleboliths are noted in the pelvis and lower abdomen. IMPRESSION: Negative.  No abnormality seen to explain hip pain. Electronically Signed   By: Nelson Chimes M.D.   On: 06/28/2017 09:59    Microbiology: No results found for this or any previous visit (from the past 240 hour(s)).   Labs: Basic Metabolic Panel: No results for input(s): NA, K, CL, CO2, GLUCOSE, BUN, CREATININE, CALCIUM, MG, PHOS in the last 168 hours. Liver Function Tests: No results for input(s): AST, ALT, ALKPHOS, BILITOT, PROT, ALBUMIN in the last 168 hours. No results for input(s): LIPASE, AMYLASE in the last 168 hours. No results for input(s): AMMONIA in the last 168 hours. CBC: No results  for input(s): WBC, NEUTROABS, HGB, HCT, MCV, PLT in the last 168 hours. Cardiac Enzymes: No results for input(s): CKTOTAL, CKMB, CKMBINDEX, TROPONINI in the last 168 hours. BNP: BNP (last 3 results) No results for input(s): BNP in the last 8760 hours.  ProBNP (last 3 results) No results for input(s): PROBNP in the last 8760 hours.  CBG: No results for input(s): GLUCAP in the last 168 hours.     SignedDomenic Polite MD.  [facility] 07/12/2017, 1:52 PM

## 2017-07-13 ENCOUNTER — Encounter: Payer: Self-pay | Admitting: Radiation Oncology

## 2017-07-13 ENCOUNTER — Ambulatory Visit
Admission: RE | Admit: 2017-07-13 | Discharge: 2017-07-13 | Disposition: A | Discharge status: Home / Self Care | Payer: Medicaid Other | Source: Ambulatory Visit | Attending: Radiation Oncology | Admitting: Radiation Oncology

## 2017-07-13 DIAGNOSIS — Z510 Encounter for antineoplastic radiation therapy: Secondary | ICD-10-CM | POA: Diagnosis not present

## 2017-07-16 ENCOUNTER — Ambulatory Visit (HOSPITAL_BASED_OUTPATIENT_CLINIC_OR_DEPARTMENT_OTHER): Payer: Self-pay

## 2017-07-16 ENCOUNTER — Encounter: Payer: Self-pay | Admitting: Hematology and Oncology

## 2017-07-16 ENCOUNTER — Ambulatory Visit (HOSPITAL_BASED_OUTPATIENT_CLINIC_OR_DEPARTMENT_OTHER): Payer: Self-pay | Admitting: Hematology and Oncology

## 2017-07-16 ENCOUNTER — Telehealth: Payer: Self-pay | Admitting: Hematology and Oncology

## 2017-07-16 ENCOUNTER — Ambulatory Visit: Payer: Self-pay | Admitting: Oncology

## 2017-07-16 VITALS — BP 153/92 | HR 99 | Temp 98.0°F | Resp 18 | Ht 74.0 in | Wt 185.9 lb

## 2017-07-16 DIAGNOSIS — C8338 Diffuse large B-cell lymphoma, lymph nodes of multiple sites: Secondary | ICD-10-CM

## 2017-07-16 DIAGNOSIS — C7951 Secondary malignant neoplasm of bone: Secondary | ICD-10-CM

## 2017-07-16 LAB — CBC WITH DIFFERENTIAL/PLATELET
BASO%: 0 % (ref 0.0–2.0)
BASOS ABS: 0 10*3/uL (ref 0.0–0.1)
EOS ABS: 0 10*3/uL (ref 0.0–0.5)
EOS%: 0.6 % (ref 0.0–7.0)
HCT: 38.4 % (ref 38.4–49.9)
HEMOGLOBIN: 13.1 g/dL (ref 13.0–17.1)
LYMPH%: 8.2 % — AB (ref 14.0–49.0)
MCH: 25.9 pg — AB (ref 27.2–33.4)
MCHC: 34.1 g/dL (ref 32.0–36.0)
MCV: 75.9 fL — AB (ref 79.3–98.0)
MONO#: 0.3 10*3/uL (ref 0.1–0.9)
MONO%: 4.4 % (ref 0.0–14.0)
NEUT#: 6.1 10*3/uL (ref 1.5–6.5)
NEUT%: 86.8 % — AB (ref 39.0–75.0)
Platelets: 173 10*3/uL (ref 140–400)
RBC: 5.06 10*6/uL (ref 4.20–5.82)
RDW: 15.5 % — ABNORMAL HIGH (ref 11.0–14.6)
WBC: 7 10*3/uL (ref 4.0–10.3)
lymph#: 0.6 10*3/uL — ABNORMAL LOW (ref 0.9–3.3)

## 2017-07-16 LAB — COMPREHENSIVE METABOLIC PANEL
ALBUMIN: 3.7 g/dL (ref 3.5–5.0)
ALT: 148 U/L — ABNORMAL HIGH (ref 0–55)
AST: 44 U/L — ABNORMAL HIGH (ref 5–34)
Alkaline Phosphatase: 173 U/L — ABNORMAL HIGH (ref 40–150)
Anion Gap: 9 mEq/L (ref 3–11)
BUN: 25.2 mg/dL (ref 7.0–26.0)
CO2: 27 mEq/L (ref 22–29)
Calcium: 10 mg/dL (ref 8.4–10.4)
Chloride: 96 mEq/L — ABNORMAL LOW (ref 98–109)
Creatinine: 0.9 mg/dL (ref 0.7–1.3)
GLUCOSE: 95 mg/dL (ref 70–140)
POTASSIUM: 3.9 meq/L (ref 3.5–5.1)
SODIUM: 132 meq/L — AB (ref 136–145)
Total Bilirubin: 1.27 mg/dL — ABNORMAL HIGH (ref 0.20–1.20)
Total Protein: 7.1 g/dL (ref 6.4–8.3)

## 2017-07-16 LAB — MAGNESIUM: MAGNESIUM: 2.2 mg/dL (ref 1.5–2.5)

## 2017-07-16 LAB — LACTATE DEHYDROGENASE: LDH: 193 U/L (ref 125–245)

## 2017-07-16 LAB — URIC ACID: Uric Acid, Serum: 6.1 mg/dl (ref 2.6–7.4)

## 2017-07-16 MED ORDER — ACYCLOVIR 200 MG PO CAPS: 200.0000 mg | ORAL_CAPSULE | Freq: Two times a day (BID) | ORAL | 0 refills | Status: AC

## 2017-07-16 MED ORDER — OXYCODONE HCL 5 MG PO TABS: 5.0000 mg | ORAL_TABLET | ORAL | 0 refills | Status: DC | PRN

## 2017-07-16 MED ORDER — ALLOPURINOL 300 MG PO TABS: 300.0000 mg | ORAL_TABLET | Freq: Every day | ORAL | 0 refills | Status: DC

## 2017-07-16 MED ORDER — DEXAMETHASONE 4 MG PO TABS: 4.0000 mg | ORAL_TABLET | Freq: Every day | ORAL | 0 refills | Status: AC

## 2017-07-16 NOTE — Progress Notes (Signed)
START ON PATHWAY REGIMEN - Lymphoma and CLL     A cycle is every 21 days:     Rituximab      Cyclophosphamide      Doxorubicin      Vincristine      Prednisone   **Always confirm dose/schedule in your pharmacy ordering system**    Patient Characteristics: Diffuse Large B Cell Lymphoma, First Line, Stage III and IV Disease Type: Not Applicable Disease Type: Diffuse Large B Cell Line of therapy: First Line [place] Stage: IV Intent of Therapy: Curative Intent, Discussed with Patient 

## 2017-07-16 NOTE — Patient Instructions (Signed)
Thank you for choosing [facility] to provide your oncology and hematology care.  To afford each patient quality time with our providers, please arrive 30 minutes before your scheduled appointment time.  If you arrive late for your appointment, you may be asked to reschedule.  We strive to give you quality time with our providers, and arriving late affects you and other patients whose appointments are after yours.   If you are a no show for multiple scheduled visits, you may be dismissed from the clinic at the providers discretion.    Again, thank you for choosing [facility], our hope is that these requests will decrease the amount of time that you wait before being seen by our physicians.  ______________________________________________________________________  Should you have questions after your visit to the [facility], please contact our office at [phone] between the hours of 8:30 and 4:30 p.m.    Voicemails left after 4:30p.m will not be returned until the following business day.    For prescription refill requests, please have your pharmacy contact us directly.  Please also try to allow 48 hours for prescription requests.    Please contact the scheduling department for questions regarding scheduling.  For scheduling of procedures such as PET scans, CT scans, MRI, Ultrasound, etc please contact central scheduling at [phone].    Resources For Cancer Patients and Caregivers:   Oncolink.org:  A wonderful resource for patients and healthcare providers for information regarding your disease, ways to tract your treatment, what to expect, etc.     American Cancer Society:  800-227-2345  Can help patients locate various types of support and financial assistance  Cancer Care: 1-800-813-HOPE (4673) Provides financial assistance, online support groups, medication/co-pay assistance.    [place] DSS:  336-641-3447 Where to apply for food  stamps, Medicaid, and utility assistance  Medicare Rights Center: 800-333-4114 Helps people with Medicare understand their rights and benefits, navigate the Medicare system, and secure the quality healthcare they deserve  SCAT: 336-333-6589 [employer]'s shared-ride transportation service for eligible riders who have a disability that prevents them from riding the fixed route bus.    For additional information on assistance programs please contact our social worker:   Grier Hock/Abigail Elmore:  [phone]            

## 2017-07-16 NOTE — Telephone Encounter (Signed)
Scheduled appt per 8/13 los - Gave patient AVS and calender per los.  

## 2017-07-17 LAB — HEPATITIS C ANTIBODY: Hep C Virus Ab: 0.1 s/co ratio (ref 0.0–0.9)

## 2017-07-17 LAB — HEPATITIS B CORE ANTIBODY, TOTAL: HEP B C TOTAL AB: POSITIVE — AB

## 2017-07-17 LAB — HEPATITIS B SURFACE ANTIGEN: HBsAg Screen: NEGATIVE

## 2017-07-17 LAB — HEPATITIS B SURFACE ANTIBODY,QUALITATIVE: Hep B Surface Ab, Qual: REACTIVE

## 2017-07-17 LAB — PHOSPHORUS: Phosphorus, Ser: 2.8 mg/dL (ref 2.5–4.5)

## 2017-07-18 ENCOUNTER — Encounter: Payer: Self-pay | Admitting: *Deleted

## 2017-07-18 NOTE — Assessment & Plan Note (Signed)
56 year old male ecchymosis of diffuse large B-cell lymphoma, GCB subtype. Initial presentation with severe left hip and knee due to extensive disease involving the skeletal structures of the spine with multilevel compression fractures. Some spinal canal intrusion without obvious cord compression. Additionally, bulky retroperitoneal lymphadenopathy and a soft tissue mass in the thoracic wall. Staging evaluation is incomplete at this point in time, but due to presence of extensive skeletal disease and soft tissue mass in the chest wall, patient is already stage IV.  Patient was initially treated with steroids and palliative radiation to the spine which resulted in significant symptomatic improvement. Currently, patient is receiving dexamethasone 4 mg twice a day. Next phase of care will include staging which may be slightly complicated by pretreatment steroids. We will need to conduct baseline lab work evaluation with a current diagnosis, stains evaluation by dentistry due to likely need of supplementation with bisphosphonate due to extensive skeletal disease. Please see below for additional details of our plan.  Plan: --Labs today as outlined below including hepatitis B testing due to plans for rituximab use --CT C/A/P with contrast to assess for full extent of lymphadenopathy as the previous studies were without contrast and did not include Chest. Normally, a PET/CT will be used, but patient has been treated with steroids, has received radiation, and would have to pay out of pocket for the study so we will abstain from that. --Start Allopurinol 382m PO QDay --Decrease dexamethasone to 445mPO QDay --Start Acyclovir  --CoEnvironmental education officer- patient is currently on short and self-pay. Our evaluations and treatments will be rather extensive and expensive and patient will need significant amount of assistance. --Consult Dentistry: Per plan using only using does not smoke or zoledronic acid during the  course of therapy. Patient has reasonably poor dentition and any extractions need to be done prior to initiation of systemic chemotherapy. --Consult IR for infusaport placement. I do not see significant benefit for obtaining bone marrow biopsy as patient has extensive skeletal disease and bone marrow biopsies likely to be positive. --ECHO --Therapy plan: R-CHOP chemotherapy as long as no problems with electrocardiogram or hepatitis B testing. If left ventricular function is questionable, we will switch to R-CEOP. We will consider intrathecal methotrexate for CNS relapse prophylaxis as there is direct invasion into the spinal canal by the tumors and CNS recurrence is a significant risk. We'll likely start prophylaxis starting the second cycle of systemic chemotherapy. --RTC 3wks: Labs, clinic visit, possible initiation of systemic chemotherapy with R-CHOP and zoledronic acid  Voice recognition software was used and creation of this note. Despite my best effort at editing the text, some misspelling/errors may have occurred.

## 2017-07-18 NOTE — Progress Notes (Signed)
[facility] New Visit:  Assessment: Diffuse large B cell lymphoma ([place]) 56 year old male ecchymosis of diffuse large B-cell lymphoma, GCB subtype. Initial presentation with severe left hip and knee due to extensive disease involving the skeletal structures of the spine with multilevel compression fractures. Some spinal canal intrusion without obvious cord compression. Additionally, bulky retroperitoneal lymphadenopathy and a soft tissue mass in the thoracic wall. Staging evaluation is incomplete at this point in time, but due to presence of extensive skeletal disease and soft tissue mass in the chest wall, patient is already stage IV.  Patient was initially treated with steroids and palliative radiation to the spine which resulted in significant symptomatic improvement. Currently, patient is receiving dexamethasone 4 mg twice a day. Next phase of care will include staging which may be slightly complicated by pretreatment steroids. We will need to conduct baseline lab work evaluation with a current diagnosis, stains evaluation by dentistry due to likely need of supplementation with bisphosphonate due to extensive skeletal disease. Please see below for additional details of our plan.  Plan: --Labs today as outlined below including hepatitis B testing due to plans for rituximab use --CT C/A/P with contrast to assess for full extent of lymphadenopathy as the previous studies were without contrast and did not include Chest. Normally, a PET/CT will be used, but patient has been treated with steroids, has received radiation, and would have to pay out of pocket for the study so we will abstain from that. --Start Allopurinol 312m PO QDay --Decrease dexamethasone to 473mPO QDay --Start Acyclovir  --CoEnvironmental education officer- patient is currently on short and self-pay. Our evaluations and treatments will be rather extensive and expensive and patient will need significant amount of  assistance. --Consult Dentistry: Per plan using only using does not smoke or zoledronic acid during the course of therapy. Patient has reasonably poor dentition and any extractions need to be done prior to initiation of systemic chemotherapy. --Consult IR for infusaport placement. I do not see significant benefit for obtaining bone marrow biopsy as patient has extensive skeletal disease and bone marrow biopsies likely to be positive. --ECHO --Therapy plan: R-CHOP chemotherapy as long as no problems with electrocardiogram or hepatitis B testing. If left ventricular function is questionable, we will switch to R-CEOP. We will consider intrathecal methotrexate for CNS relapse prophylaxis as there is direct invasion into the spinal canal by the tumors and CNS recurrence is a significant risk. We'll likely start prophylaxis starting the second cycle of systemic chemotherapy. --RTC 3wks: Labs, clinic visit, possible initiation of systemic chemotherapy with R-CHOP and zoledronic acid  Voice recognition software was used and creation of this note. Despite my best effort at editing the text, some misspelling/errors may have occurred.    Orders Placed This Encounter  Procedures  . IR FLUORO GUIDE PORT INSERTION RIGHT    Standing Status:   Future    Standing Expiration Date:   09/15/2018    Order Specific Question:   Reason for Exam (SYMPTOM  OR DIAGNOSIS REQUIRED)    Answer:   Planned for systemic chemotherapy for DLBCL diagnosis    Order Specific Question:   Preferred Imaging Location?    Answer:   [facility]. CT Abdomen Pelvis W Contrast    Standing Status:   Future    Standing Expiration Date:   07/16/2018    Order Specific Question:   If indicated for the ordered procedure, I authorize the administration of contrast media per Radiology  protocol    Answer:   Yes    Order Specific Question:   Preferred imaging location?    Answer:   [facility]    Order Specific Question:    Radiology Contrast Protocol - do NOT remove file path    Answer:   \\[email or web address]    Order Specific Question:   Reason for Exam additional comments    Answer:   DLBCL staging imaging  . CT Chest W Contrast    Standing Status:   Future    Standing Expiration Date:   07/16/2018    Order Specific Question:   If indicated for the ordered procedure, I authorize the administration of contrast media per Radiology protocol    Answer:   Yes    Order Specific Question:   Preferred imaging location?    Answer:   [facility]    Order Specific Question:   Radiology Contrast Protocol - do NOT remove file path    Answer:   \\[email or web address]    Order Specific Question:   Reason for Exam additional comments    Answer:   DLBCL staging imaging  . CBC with Differential    Standing Status:   Future    Number of Occurrences:   1    Standing Expiration Date:   07/16/2018  . Comprehensive metabolic panel    Standing Status:   Future    Number of Occurrences:   1    Standing Expiration Date:   07/16/2018  . Lactate dehydrogenase (LDH)    Standing Status:   Future    Number of Occurrences:   1    Standing Expiration Date:   07/16/2018  . Uric acid    Standing Status:   Future    Number of Occurrences:   1    Standing Expiration Date:   07/16/2018  . Magnesium    Standing Status:   Future    Number of Occurrences:   1    Standing Expiration Date:   07/16/2018  . Phosphorus    Standing Status:   Future    Number of Occurrences:   1    Standing Expiration Date:   07/16/2018  . Hepatitis C antibody    Standing Status:   Future    Number of Occurrences:   1    Standing Expiration Date:   07/16/2018  . Hepatitis B surface antibody    Standing Status:   Future    Number of Occurrences:   1    Standing Expiration Date:   07/16/2018  . Hepatitis B surface antigen    Standing Status:   Future    Number of Occurrences:   1    Standing Expiration  Date:   07/16/2018  . Hepatitis B core antibody, total    Standing Status:   Future    Number of Occurrences:   1    Standing Expiration Date:   07/16/2018  . CBC with Differential    Standing Status:   Future    Standing Expiration Date:   07/16/2018  . Comprehensive metabolic panel    Standing Status:   Future    Standing Expiration Date:   07/16/2018  . Uric acid    Standing Status:   Future    Standing Expiration Date:   07/16/2018  . Magnesium    Standing Status:   Future    Standing Expiration Date:   07/16/2018  . Phosphorus    Standing Status:  Future    Standing Expiration Date:   07/16/2018  . Ambulatory referral to Dentistry    Referral Priority:   Routine    Referral Type:   Consultation    Referral Reason:   Specialty Services Required    Requested Specialty:   Dental General Practice    Number of Visits Requested:   1  . ECHOCARDIOGRAM COMPLETE    Pre-anthracycline evaluation    Standing Status:   Future    Standing Expiration Date:   10/16/2018    Order Specific Question:   Where should this test be performed    Answer:   [place]    Order Specific Question:   Perflutren DEFINITY (image enhancing agent) should be administered unless hypersensitivity or allergy exist    Answer:   Administer Perflutren    Order Specific Question:   Expected Date:    Answer:   1 week    All questions were answered.  . The patient knows to call the clinic with any problems, questions or concerns.  This note was electronically signed.    History of Presenting Illness Larnce Schnackenberg 56 y.o. presenting to the [facility] for new diagnosis of diffuse large B-cell lymphoma. Patient initially presented on 06/28/17 with progressive pain in the left hip and knee at least 6 week duration. This was accompanied by decreased appetite, decreased activity due to the pain. Patient was not closely followed by physician for an extended period of time due to lack of insurance.  Initially presented  the week prior and was diagnosed with sciatica and treated with steroids. Steroids resulted in symptomatic improvement, but the pain recurred rapidly after completion of a taper. Approximately at the time of onset of the pain in the left lower extremity, patient will start noticing mild abdominal discomfort. This has been associated with loss of appetite for a month and some weight loss. You, dysphasia, early satiety, diarrhea, constipation, melena, or hematochezia. Initial imaging evaluation revealed multifocal lytic lesions in the skeletal structures as well as pelvic mass. Oncology was consulted and patient was seen by Dr Marjie Skiff. Based on his recommendations, additional imaging was obtained, she was started on steroids, neurosurgery and radiation oncology were consulted. Patient was started on palliative radiation therapy to the lumbar spine and completed therapy on Friday. Overall, his pain has improved significantly.  At this time, [place] denies any new symptoms. Please see hematological history below for details of available imaging and pathology.  Oncological/hematological History: --CT Renal protocol, 06/28/17: Bilateral nephrolithiasis. 6.7 cm soft tissue mass in the left. Aortic location, additional 3.6 cm left. Aortic soft tissue mass. Mild prostatic enlargement. 5.6 cm soft tissue mass between the anterior right sixth and seventh ribs. Patchy lucencies in the T10, soft tissue mass in the right posterior elements of T12 measuring 3.9 cm with associated osseous destruction. There is mass extension into the spinal canal. Small lucent lesion in the posterior L1. Patchy lucency throughout L3 with partial collapse of the vertebral body. Soft tissue mass extending into the canal. Bilateral pubic symphysis lesions. --MRI T/L Spine, 06/28/17: Multiple osseous lesions involving T2, T6, T7 with the near complete involvement of the vertebral body, T8, T10 with almost complete replacement, T12. Thoracic spinal  cord with normal signal. Large L3 vertebral body lesion with burst fracture with 50% height loss, small lesion in L4, left prevertebral mass at the L1 level measuring 3.9 cm, large periaortic mass at the level of left renal vessels measuring 9.4 cm. --Core Bx Rt  chest wall, 06/29/17: Diffuse large B-cell lymphoma, germinal center subtype; IHC -- positive for CD10, CD20, BCL-6, BCL-2, Ki-67 86%  Medical History: Past Medical History:  Diagnosis Date  . Cancer ([place])   . Tendonitis     Surgical History: Past Surgical History:  Procedure Laterality Date  . HERNIA REPAIR      Family History: Family History  Problem Relation Age of Onset  . Cancer Father     Social History: Social History   Social History  . Marital status: Single    Spouse name: N/A  . Number of children: N/A  . Years of education: N/A   Occupational History  . Not on file.   Social History Main Topics  . Smoking status: Current Every Day Smoker    Types: E-cigarettes  . Smokeless tobacco: Never Used  . Alcohol use Yes     Comment: occasionally  . Drug use: No  . Sexual activity: No   Other Topics Concern  . Not on file   Social History Narrative  . No narrative on file    Allergies: Allergies  Allergen Reactions  . Bee Venom Swelling    Medications:  Current Outpatient Prescriptions  Medication Sig Dispense Refill  . dexamethasone (DECADRON) 4 MG tablet Take 1 tablet (4 mg total) by mouth daily. 30 tablet 0  . oxyCODONE (OXY IR/ROXICODONE) 5 MG immediate release tablet Take 1 tablet (5 mg total) by mouth every 4 (four) hours as needed for moderate pain or severe pain. 50 tablet 0  . pantoprazole (PROTONIX) 40 MG tablet Take 1 tablet (40 mg total) by mouth daily at 12 noon. 30 tablet 0  . polyethylene glycol (MIRALAX / GLYCOLAX) packet Take 17 g by mouth daily as needed for mild constipation. 14 each 0  . prochlorperazine (COMPAZINE) 10 MG tablet Take 10 mg by mouth every 8 (eight) hours as  needed for nausea or vomiting. Dispense 40 tablets, no reill    . acyclovir (ZOVIRAX) 200 MG capsule Take 1 capsule (200 mg total) by mouth 2 (two) times daily. 60 capsule 0  . allopurinol (ZYLOPRIM) 300 MG tablet Take 1 tablet (300 mg total) by mouth daily. 30 tablet 0   No current facility-administered medications for this visit.     Review of Systems: Review of Systems  Constitutional: Positive for appetite change and fatigue. Negative for chills, diaphoresis, fever and unexpected weight change.  HENT:  Negative.   Eyes: Negative.   Respiratory: Negative.   Cardiovascular: Negative.   Gastrointestinal: Negative.   Endocrine: Negative.   Genitourinary: Negative.    Musculoskeletal: Positive for back pain. Negative for arthralgias, flank pain, myalgias, neck pain and neck stiffness.  Skin: Negative.   Neurological: Negative.   Hematological: Negative.   Psychiatric/Behavioral: Negative.      PHYSICAL EXAMINATION Blood pressure (!) 153/92, pulse 99, temperature 98 F (36.7 C), temperature source Oral, resp. rate 18, height 6' 2"  (1.88 m), weight 185 lb 14.4 oz (84.3 kg), SpO2 99 %.  ECOG PERFORMANCE STATUS: 2 - Symptomatic, <50% confined to bed  Physical Exam  Constitutional: He is oriented to person, place, and time. No distress.  HENT:  Head: Normocephalic and atraumatic.  Mouth/Throat: Oropharynx is clear and moist. No oropharyngeal exudate.  Eyes: Pupils are equal, round, and reactive to light. Right eye exhibits no discharge. No scleral icterus.  Neck: Normal range of motion. No thyromegaly present.  Cardiovascular: Normal rate and regular rhythm.  Exam reveals no gallop.   No murmur  heard. Pulmonary/Chest: Effort normal. No respiratory distress. He has no wheezes. He exhibits no tenderness.  Abdominal: Soft. He exhibits no distension and no mass. There is no tenderness. There is no rebound and no guarding.  Musculoskeletal: He exhibits no edema, tenderness or  deformity.  Lymphadenopathy:    He has no cervical adenopathy.  Neurological: He is alert and oriented to person, place, and time. He has normal reflexes.  Skin: Skin is warm and dry. No rash noted. He is not diaphoretic. No erythema. No pallor.     LABORATORY DATA: I have personally reviewed the data as listed: Appointment on 07/16/2017  Component Date Value Ref Range Status  . WBC 07/16/2017 7.0  4.0 - 10.3 10e3/uL Final  . NEUT# 07/16/2017 6.1  1.5 - 6.5 10e3/uL Final  . HGB 07/16/2017 13.1  13.0 - 17.1 g/dL Final  . HCT 07/16/2017 38.4  38.4 - 49.9 % Final  . Platelets 07/16/2017 173  140 - 400 10e3/uL Final  . MCV 07/16/2017 75.9* 79.3 - 98.0 fL Final  . MCH 07/16/2017 25.9* 27.2 - 33.4 pg Final  . MCHC 07/16/2017 34.1  32.0 - 36.0 g/dL Final  . RBC 07/16/2017 5.06  4.20 - 5.82 10e6/uL Final  . RDW 07/16/2017 15.5* 11.0 - 14.6 % Final  . lymph# 07/16/2017 0.6* 0.9 - 3.3 10e3/uL Final  . MONO# 07/16/2017 0.3  0.1 - 0.9 10e3/uL Final  . Eosinophils Absolute 07/16/2017 0.0  0.0 - 0.5 10e3/uL Final  . Basophils Absolute 07/16/2017 0.0  0.0 - 0.1 10e3/uL Final  . NEUT% 07/16/2017 86.8* 39.0 - 75.0 % Final  . LYMPH% 07/16/2017 8.2* 14.0 - 49.0 % Final  . MONO% 07/16/2017 4.4  0.0 - 14.0 % Final  . EOS% 07/16/2017 0.6  0.0 - 7.0 % Final  . BASO% 07/16/2017 0.0  0.0 - 2.0 % Final  . Sodium 07/16/2017 132* 136 - 145 mEq/L Final  . Potassium 07/16/2017 3.9  3.5 - 5.1 mEq/L Final  . Chloride 07/16/2017 96* 98 - 109 mEq/L Final  . CO2 07/16/2017 27  22 - 29 mEq/L Final  . Glucose 07/16/2017 95  70 - 140 mg/dl Final   Glucose reference range is for nonfasting patients. Fasting glucose reference range is 70- 100.  Marland Kitchen BUN 07/16/2017 25.2  7.0 - 26.0 mg/dL Final  . Creatinine 07/16/2017 0.9  0.7 - 1.3 mg/dL Final  . Total Bilirubin 07/16/2017 1.27* 0.20 - 1.20 mg/dL Final  . Alkaline Phosphatase 07/16/2017 173* 40 - 150 U/L Final  . AST 07/16/2017 44* 5 - 34 U/L Final  . ALT 07/16/2017  148* 0 - 55 U/L Final  . Total Protein 07/16/2017 7.1  6.4 - 8.3 g/dL Final  . Albumin 07/16/2017 3.7  3.5 - 5.0 g/dL Final  . Calcium 07/16/2017 10.0  8.4 - 10.4 mg/dL Final  . Anion Gap 07/16/2017 9  3 - 11 mEq/L Final  . EGFR 07/16/2017 >90  >90 ml/min/1.73 m2 Final   eGFR is calculated using the CKD-EPI Creatinine Equation (2009)  . LDH 07/16/2017 193  125 - 245 U/L Final  . Uric Acid, Serum 07/16/2017 6.1  2.6 - 7.4 mg/dl Final  . Magnesium 07/16/2017 2.2  1.5 - 2.5 mg/dl Final  . Phosphorus, Ser 07/16/2017 2.8  2.5 - 4.5 mg/dL Final  . Hep C Virus Ab 07/16/2017 0.1  0.0 - 0.9 s/co ratio Final   Comment:  Negative:     < 0.8                                             Indeterminate: 0.8 - 0.9                                                  Positive:     > 0.9                 The CDC recommends that a positive HCV antibody result                 be followed up with a HCV Nucleic Acid Amplification                 test (638177).   . Hep B Surface Ab, Qual 07/16/2017 Reactive   Final   Comment:                              Non Reactive: Inconsistent with immunity,                                            less than 10 mIU/mL                              Reactive:     Consistent with immunity,                                            greater than 9.9 mIU/mL   . HBsAg Screen 07/16/2017 Negative  Negative Final  . Hep B Core Ab, Tot 07/16/2017 Positive* Negative Final         Ardath Sax, MD

## 2017-07-19 ENCOUNTER — Ambulatory Visit (HOSPITAL_COMMUNITY): Payer: Self-pay | Admitting: Dentistry

## 2017-07-19 ENCOUNTER — Ambulatory Visit (HOSPITAL_COMMUNITY)
Admission: RE | Admit: 2017-07-19 | Discharge: 2017-07-19 | Disposition: A | Discharge status: Home / Self Care | Payer: Medicaid Other | Source: Ambulatory Visit | Attending: Hematology and Oncology | Admitting: Hematology and Oncology

## 2017-07-19 ENCOUNTER — Encounter (HOSPITAL_COMMUNITY): Payer: Self-pay | Admitting: Dentistry

## 2017-07-19 VITALS — BP 131/65 | HR 92 | Temp 98.2°F

## 2017-07-19 DIAGNOSIS — K068 Other specified disorders of gingiva and edentulous alveolar ridge: Secondary | ICD-10-CM | POA: Diagnosis not present

## 2017-07-19 DIAGNOSIS — C8338 Diffuse large B-cell lymphoma, lymph nodes of multiple sites: Secondary | ICD-10-CM | POA: Diagnosis present

## 2017-07-19 DIAGNOSIS — Z01818 Encounter for other preprocedural examination: Secondary | ICD-10-CM

## 2017-07-19 DIAGNOSIS — K0530 Chronic periodontitis, unspecified: Secondary | ICD-10-CM

## 2017-07-19 DIAGNOSIS — K031 Abrasion of teeth: Secondary | ICD-10-CM

## 2017-07-19 DIAGNOSIS — K06010 Localized gingival recession, unspecified: Secondary | ICD-10-CM

## 2017-07-19 DIAGNOSIS — K036 Deposits [accretions] on teeth: Secondary | ICD-10-CM | POA: Diagnosis not present

## 2017-07-19 DIAGNOSIS — C8330 Diffuse large B-cell lymphoma, unspecified site: Secondary | ICD-10-CM

## 2017-07-19 DIAGNOSIS — M270 Developmental disorders of jaws: Secondary | ICD-10-CM

## 2017-07-19 DIAGNOSIS — M264 Malocclusion, unspecified: Secondary | ICD-10-CM

## 2017-07-19 DIAGNOSIS — C7951 Secondary malignant neoplasm of bone: Secondary | ICD-10-CM

## 2017-07-19 DIAGNOSIS — M2629 Other anomalies of dental arch relationship: Secondary | ICD-10-CM

## 2017-07-19 DIAGNOSIS — J392 Other diseases of pharynx: Secondary | ICD-10-CM

## 2017-07-19 NOTE — Patient Instructions (Signed)
The patient is to follow up with Dr. Derrek Gu for initial periodontal therapy prior to the start of the chemotherapy and Xgeva/Zometa therapy.  Dr. Enrique Sack

## 2017-07-19 NOTE — Progress Notes (Signed)
DENTAL CONSULTATION  Date of Consultation:  07/19/2017 Patient Name:   Cory Ibarra Date of Birth:   July 08, 1961 Medical Record Number: [id number]  VITALS: BP 131/65 (BP Location: Left Arm)   Pulse 92   Temp 98.2 F (36.8 C) (Oral)   CHIEF COMPLAINT: Patient referred by Dr. [name] for a dental consultation.  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  Patient completed radiation therapy to the bony metastases and chest wall mass. The patient now with anticipated chemotherapy and possible Xgeva/Zometa therapy. Patient is now seen as part of a medically necessary prechemotherapy and pre-Xgeva/Zometa therapy dental protocol examination.  The patient currently denies acute toothaches, swellings, or abscesses. The patient was last seen by a dentist for an exam and cleaning in in approximately the year 2000 or 2001. This was with Dr. Derrek Gu. Patient unable to maintain dental treatment due to economic concerns.  Patient denies having dental phobia.   PROBLEM LIST: Patient Active Problem List   Diagnosis Date Noted  . Diffuse large B cell lymphoma ([place]) 07/03/2017    Priority: High  . Bony metastasis ([place]) 06/29/2017  . Chest wall mass   . 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 Closed compression fracture of L3 lumbar vertebra ([place]) 06/28/2017  . Severe back pain 06/28/2017  . Weight loss, unintentional 06/28/2017    PMH: Past Medical History:  Diagnosis Date  . Cancer ([place])   . Tendonitis     PSH: Past Surgical History:  Procedure Laterality Date  . HERNIA REPAIR  1988   Dr. Vida Rigger    ALLERGIES: Allergies  Allergen Reactions  . Bee Venom Swelling    MEDICATIONS: Current Outpatient Prescriptions  Medication Sig Dispense Refill  . acyclovir (ZOVIRAX) 200 MG capsule Take 1 capsule (200 mg total) by mouth 2 (two) times daily. 60 capsule 0  . allopurinol (ZYLOPRIM) 300 MG tablet Take 1 tablet (300 mg  total) by mouth daily. 30 tablet 0  . dexamethasone (DECADRON) 4 MG tablet Take 1 tablet (4 mg total) by mouth daily. 30 tablet 0  . oxyCODONE (OXY IR/ROXICODONE) 5 MG immediate release tablet Take 1 tablet (5 mg total) by mouth every 4 (four) hours as needed for moderate pain or severe pain. 50 tablet 0  . pantoprazole (PROTONIX) 40 MG tablet Take 1 tablet (40 mg total) by mouth daily at 12 noon. 30 tablet 0  . polyethylene glycol (MIRALAX / GLYCOLAX) packet Take 17 g by mouth daily as needed for mild constipation. 14 each 0  . prochlorperazine (COMPAZINE) 10 MG tablet Take 10 mg by mouth every 8 (eight) hours as needed for nausea or vomiting. Dispense 40 tablets, no reill     No current facility-administered medications for this visit.     LABS: Lab Results  Component Value Date   WBC 7.0 07/16/2017   HGB 13.1 07/16/2017   HCT 38.4 07/16/2017   MCV 75.9 (L) 07/16/2017   PLT 173 07/16/2017      Component Value Date/Time   NA 132 (L) 07/16/2017 1433   K 3.9 07/16/2017 1433   CL 103 06/29/2017 0351   CO2 27 07/16/2017 1433   GLUCOSE 95 07/16/2017 1433   BUN 25.2 07/16/2017 1433   CREATININE 0.9 07/16/2017 1433   CALCIUM 10.0 07/16/2017 1433   GFRNONAA >60 06/29/2017 0351   GFRAA >60 06/29/2017 0351   Lab Results  Component Value Date   INR 1.06 06/29/2017   No results found  for: PTT  SOCIAL HISTORY: Social History   Social History  . Marital status: Single    Spouse name: N/A  . Number of children: 0  . Years of education: N/A   Occupational History  . Not on file.   Social History Main Topics  . Smoking status: Never Smoker  . Smokeless tobacco: Never Used  . Alcohol use Yes     Comment: occasionally/socially  . Drug use: No  . Sexual activity: No   Other Topics Concern  . 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 Never used smokeless tobacco.   No e-cigarettes   Occasional alcohol  consumption socially.   Single, no children.    FAMILY HISTORY: Family History  Problem Relation Age of Onset  . Cancer Father     REVIEW OF SYSTEMS: Reviewed with the patient as per History of present illness. Psych: The patient has a history of anxiety. Patient denies having dental phobia.  DENTAL HISTORY: Patient referred by Dr. [name] for a dental consultation.  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  Patient completed radiation therapy to the bony metastases and chest wall mass. The patient now with anticipated chemotherapy and possible Xgeva/Zometa therapy. Patient is now seen as part of a medically necessary prechemotherapy and pre-Xgeva/Zometa therapy dental protocol examination.  The patient currently denies acute toothaches, swellings, or abscesses. The patient was last seen by a dentist for an exam and cleaning in in approximately the year 2000 or 2001. This was with Dr. Derrek Gu. Patient unable to maintain dental treatment due to economic concerns.  Patient denies having dental phobia.  DENTAL EXAMINATION: GENERAL: The patient is a tall, well-developed, well-nourished male in no acute distress. Patient presents with a removable back brace and is walking with the aid of a cane. HEAD AND NECK: There is no palpable neck lymphadenopathy. The patient denies acute TMJ symptoms. INTRAORAL EXAM: Patient has normal saliva. Patient has a mid palatal torus. DENTITION: Patient is not missing any teeth. Multiple flexure lesions are noted. PERIODONTAL: The patient has chronic periodontitis with plaque and calculus accumulations, selective areas of gingival recession, and no significant tooth mobility. There is incipient to moderate bone loss noted. DENTAL CARIES/SUBOPTIMAL RESTORATIONS: No obvious dental caries are noted. ENDODONTIC: Patient denies acute pulpitis symptoms. I do not see any evidence of periapical  pathology on the [place]: Patient has full gold crowns on tooth numbers 19 and 30. PROSTHODONTIC: There are no partail dentures. OCCLUSION: Patient has a poor occlusal scheme due to deep overbite and multiple malpositioned teeth, but does have a stable occlusion.  RADIOGRAPHIC INTERPRETATION: An orthopantogram was taken today. A full series was unable to be taken secondary to significant gag reflex. There are no missing teeth. There is incipient to moderate bone loss noted. There are no obvious periapical radiolucencies. There are crowns on tooth numbers 19 and 30. Several dental amalgam restorations are noted. Multiple rotated teeth are noted.   ASSESSMENTS: 1. Diffuse large B-cell lymphoma 2. Bone metastases 3. Prechemotherapy and pre-Xgeva/Zometa therapy dental protocol 4. Chronic periodontitis with bone loss 5. Gingival recession 6. Accretions 7. Multiple flexure lesions 8. Deep overbite 9. Poor occlusal scheme but a stable occlusion 10. Severe gag reflex   PLAN/RECOMMENDATIONS: 1. I discussed the risks, benefits, and complications of various treatment options with the patient in relationship to his  medical and dental conditions, anticipated chemotherapy, and anticipated Xgeva or Zometa therapy with the risk for infection and osteonecrosis of the jaw. We discussed various treatment options to include no treatment, periodontal therapy, dental restorations, root canal therapy, crown and bridge therapy, and fabrication of an occlusal splint. The patient currently wishes to follow-up with Dr. Orvil Feil for periodontal therapy prior to the anticipated chemotherapy and future Xgeva/Zometa therapy. Patient will then be maintained on periodontal recall and evaluation for dental restorations and occlusal splint therapy as indicated once the patient is medically stable. Patient is aware of the risk for osteonecrosis of the jaw with invasive dental procedures with the  anticipated use of Xgeva or Zometa therapy. The patient is currently cleared,from a dental standpoint, for start of chemotherapy and Xgeva/Zometa therapy as indicated in early September 2018.   2. Discussion of findings with medical team and coordination of future medical and dental care as needed.  I spent in excess of  90 minutes during the conduct of this consultation and >50% of this time involved direct face-to-face encounter for counseling and/or coordination of the patient's care.    Lenn Cal, DDS

## 2017-07-19 NOTE — Progress Notes (Signed)
  Echocardiogram 2D Echocardiogram has been performed.  Darlina Sicilian M 07/19/2017, 10:32 AM

## 2017-07-20 ENCOUNTER — Encounter: Payer: Self-pay | Admitting: Pharmacy Technician

## 2017-07-20 NOTE — Progress Notes (Signed)
I will meet with Patient at [facility] on 0/86/57 to discuss application for Rituxan, Xgeva, and Neulasta.

## 2017-07-24 ENCOUNTER — Other Ambulatory Visit: Payer: Self-pay

## 2017-07-24 ENCOUNTER — Encounter: Payer: Self-pay | Admitting: *Deleted

## 2017-07-24 MED ORDER — PREDNISONE 20 MG PO TABS: 60.0000 mg | ORAL_TABLET | Freq: Every day | ORAL | 0 refills | Status: AC

## 2017-07-24 MED ORDER — LORAZEPAM 0.5 MG PO TABS: 0.5000 mg | ORAL_TABLET | Freq: Four times a day (QID) | ORAL | 0 refills | Status: DC | PRN

## 2017-07-24 MED ORDER — ONDANSETRON HCL 8 MG PO TABS: 8.0000 mg | ORAL_TABLET | Freq: Two times a day (BID) | ORAL | 1 refills | Status: DC | PRN

## 2017-07-24 MED ORDER — LIDOCAINE-PRILOCAINE 2.5-2.5 % EX CREA: TOPICAL_CREAM | CUTANEOUS | 3 refills | Status: DC

## 2017-07-24 NOTE — Progress Notes (Signed)
  Radiation Oncology         [phone] ________________________________  Name: Cory Ibarra MRN: [id number]  Date: 07/13/2017  DOB: 1961-01-28  End of Treatment Note  Diagnosis:   56 y.o. man with Bony Metastasis ([place]) in TL spine     Indication for treatment: Pallative       Radiation treatment dates:   07/02/17 - 07/13/17  Site/dose:   Lumbar spine // 25 Gy in 10 Fx  Beams/energy:   Photon // 15X   // isodose plan  Narrative: The patient tolerated radiation treatment relatively well. Patient reported having taste bud changes, poor appetite and mild pain in lower back. Was noted that Patient lost 8.4 pounds in one week, patient reported having to take Compazine for nausea and dizzines. No skin irriatation or changed were noted.    Plan: The patient has completed radiation treatment. The patient will return to radiation oncology clinic for routine followup in one month. I advised them to call or return sooner if they have any questions or concerns related to their recovery or treatment.  ------------------------------------------------  Jodelle Gross, MD, PhD  This document serves as a record of services personally performed by Kyung Rudd MD. It was created on his behalf by Delton Coombes, a trained medical scribe. The creation of this record is based on the scribe's personal observations and the provider's statements to them. This document has been checked and approved by the attending provider.

## 2017-07-24 NOTE — Progress Notes (Signed)
Home meds released to [facility] on 07/24/17.  Ativan called into [facility] 07/24/17.

## 2017-07-24 NOTE — Addendum Note (Signed)
Addended by: Arty Baumgartner on: 07/24/2017 02:53 PM   Modules accepted: Orders

## 2017-07-25 ENCOUNTER — Other Ambulatory Visit: Payer: Self-pay | Admitting: Student

## 2017-07-25 ENCOUNTER — Other Ambulatory Visit: Payer: Self-pay | Admitting: Radiology

## 2017-07-26 ENCOUNTER — Encounter (HOSPITAL_COMMUNITY): Payer: Self-pay

## 2017-07-26 ENCOUNTER — Other Ambulatory Visit: Payer: Self-pay | Admitting: Hematology and Oncology

## 2017-07-26 ENCOUNTER — Ambulatory Visit (HOSPITAL_COMMUNITY)
Admission: RE | Admit: 2017-07-26 | Discharge: 2017-07-26 | Disposition: A | Discharge status: Home / Self Care | Payer: Medicaid Other | Source: Ambulatory Visit | Attending: Hematology and Oncology | Admitting: Hematology and Oncology

## 2017-07-26 DIAGNOSIS — Z79899 Other long term (current) drug therapy: Secondary | ICD-10-CM | POA: Insufficient documentation

## 2017-07-26 DIAGNOSIS — C8330 Diffuse large B-cell lymphoma, unspecified site: Secondary | ICD-10-CM | POA: Insufficient documentation

## 2017-07-26 DIAGNOSIS — C8338 Diffuse large B-cell lymphoma, lymph nodes of multiple sites: Secondary | ICD-10-CM

## 2017-07-26 HISTORY — PX: IR US GUIDE VASC ACCESS RIGHT: IMG2390

## 2017-07-26 HISTORY — PX: IR FLUORO GUIDE PORT INSERTION RIGHT: IMG5741

## 2017-07-26 LAB — BASIC METABOLIC PANEL
Anion gap: 11 (ref 5–15)
BUN: 18 mg/dL (ref 6–20)
CO2: 25 mmol/L (ref 22–32)
CREATININE: 0.68 mg/dL (ref 0.61–1.24)
Calcium: 9.4 mg/dL (ref 8.9–10.3)
Chloride: 94 mmol/L — ABNORMAL LOW (ref 101–111)
GFR calc Af Amer: >60 mL/min (ref >60)
GLUCOSE: 92 mg/dL (ref 65–99)
Potassium: 3.5 mmol/L (ref 3.5–5.1)
SODIUM: 130 mmol/L — AB (ref 135–145)

## 2017-07-26 LAB — APTT: APTT: 29 s (ref 24–36)

## 2017-07-26 LAB — CBC
HCT: 36.1 % — ABNORMAL LOW (ref 39.0–52.0)
Hemoglobin: 12.3 g/dL — ABNORMAL LOW (ref 13.0–17.0)
MCH: 25.3 pg — ABNORMAL LOW (ref 26.0–34.0)
MCHC: 34.1 g/dL (ref 30.0–36.0)
MCV: 74.1 fL — AB (ref 78.0–100.0)
PLATELETS: 179 10*3/uL (ref 150–400)
RBC: 4.87 MIL/uL (ref 4.22–5.81)
RDW: 15.6 % — AB (ref 11.5–15.5)
WBC: 5.2 10*3/uL (ref 4.0–10.5)

## 2017-07-26 LAB — PROTIME-INR
INR: 1.13
Prothrombin Time: 14.5 seconds (ref 11.4–15.2)

## 2017-07-26 MED ORDER — MIDAZOLAM HCL 2 MG/2ML IJ SOLN
INTRAMUSCULAR | Status: AC
  Filled 2017-07-26: qty 6

## 2017-07-26 MED ORDER — LIDOCAINE-EPINEPHRINE (PF) 2 %-1:200000 IJ SOLN
INTRAMUSCULAR | Status: DC
Start: 2017-07-26 — End: 2017-07-27
  Filled 2017-07-26: qty 20

## 2017-07-26 MED ORDER — FENTANYL CITRATE (PF) 100 MCG/2ML IJ SOLN
INTRAMUSCULAR | Status: AC | PRN
  Administered 2017-07-26 (×2): 50 ug via INTRAVENOUS

## 2017-07-26 MED ORDER — CEFAZOLIN SODIUM-DEXTROSE 2-4 GM/100ML-% IV SOLN
INTRAVENOUS | Status: AC
  Filled 2017-07-26: qty 100

## 2017-07-26 MED ORDER — SODIUM CHLORIDE 0.9 % IV SOLN
INTRAVENOUS | Status: DC
  Administered 2017-07-26: 13:00:00 via INTRAVENOUS

## 2017-07-26 MED ORDER — LIDOCAINE HCL (PF) 1 % IJ SOLN
INTRAMUSCULAR | Status: AC
  Filled 2017-07-26: qty 30

## 2017-07-26 MED ORDER — MIDAZOLAM HCL 2 MG/2ML IJ SOLN
INTRAMUSCULAR | Status: AC | PRN
  Administered 2017-07-26 (×4): 1 mg via INTRAVENOUS

## 2017-07-26 MED ORDER — LIDOCAINE-EPINEPHRINE (PF) 2 %-1:200000 IJ SOLN
INTRAMUSCULAR | Status: AC | PRN
  Administered 2017-07-26: 10 mL via INTRADERMAL

## 2017-07-26 MED ORDER — HEPARIN SOD (PORK) LOCK FLUSH 100 UNIT/ML IV SOLN
INTRAVENOUS | Status: AC
  Administered 2017-07-26: 13:00:00
  Filled 2017-07-26: qty 5

## 2017-07-26 MED ORDER — LIDOCAINE HCL (PF) 1 % IJ SOLN
INTRAMUSCULAR | Status: AC | PRN
  Administered 2017-07-26: 10 mL

## 2017-07-26 MED ORDER — FENTANYL CITRATE (PF) 100 MCG/2ML IJ SOLN
INTRAMUSCULAR | Status: AC
  Filled 2017-07-26: qty 6

## 2017-07-26 MED ORDER — CEFAZOLIN SODIUM-DEXTROSE 2-4 GM/100ML-% IV SOLN
2.0000 g | INTRAVENOUS | Status: AC
  Administered 2017-07-26: 2 g via INTRAVENOUS

## 2017-07-26 NOTE — Procedures (Signed)
Placement of right jugular port.  Tip at SVC/RA junction.  Minimal blood loss and no immediate complication.  

## 2017-07-26 NOTE — H&P (Signed)
Chief Complaint: Patient was seen in consultation today for B-cell lymphoma  Referring Physician(s): Perlov,Mikhail G  Supervising Physician: Markus Daft  Patient Status: [facility] - Out-pt  History of Present Illness: Cory Ibarra is a 56 y.o. male with no significant past medical history who was recently diagnosed with diffuse large B-cell lymphoma confirmed with biopsy by IR in July 2018.  IR consulted for Port-A-Cath placement at the request of Dr. [name].   He presents today in his usual state of health.  He has been NPO.  He does not take blood thinners.   Past Medical History:  Diagnosis Date  . Cancer ([place])   . Tendonitis     Past Surgical History:  Procedure Laterality Date  . HERNIA REPAIR  1988   Dr. Vida Rigger    Allergies: Bee venom  Medications: Prior to Admission medications   Medication Sig Start Date End Date Taking? Authorizing Provider  acyclovir (ZOVIRAX) 200 MG capsule Take 1 capsule (200 mg total) by mouth 2 (two) times daily. 07/16/17 08/15/17  Ardath Sax, MD  allopurinol (ZYLOPRIM) 300 MG tablet Take 1 tablet (300 mg total) by mouth daily. 07/16/17 08/15/17  Ardath Sax, MD  dexamethasone (DECADRON) 4 MG tablet Take 1 tablet (4 mg total) by mouth daily. 07/16/17 08/06/17  Ardath Sax, MD  lidocaine-prilocaine (EMLA) cream Apply to affected area once 07/24/17   Perlov, Marinell Blight, MD  LORazepam (ATIVAN) 0.5 MG tablet Take 1 tablet (0.5 mg total) by mouth every 6 (six) hours as needed (Nausea or vomiting). 07/24/17   Ardath Sax, MD  ondansetron (ZOFRAN) 8 MG tablet Take 1 tablet (8 mg total) by mouth 2 (two) times daily as needed for refractory nausea / vomiting. Start on day 3 after cyclophosphamide chemotherapy. 07/24/17   Ardath Sax, MD  oxyCODONE (OXY IR/ROXICODONE) 5 MG immediate release tablet Take 1 tablet (5 mg total) by mouth every 4 (four) hours as needed for moderate pain or severe pain. 07/16/17 08/06/17  Ardath Sax, MD    pantoprazole (PROTONIX) 40 MG tablet Take 1 tablet (40 mg total) by mouth daily at 12 noon. 07/03/17   Domenic Polite, MD  polyethylene glycol [facility]) packet Take 17 g by mouth daily as needed for mild constipation. 07/03/17   Domenic Polite, MD  predniSONE (DELTASONE) 20 MG tablet Take 3 tablets (60 mg total) by mouth daily. Take on days 1-5 of chemotherapy. 07/24/17 07/29/17  Ardath Sax, MD  prochlorperazine (COMPAZINE) 10 MG tablet Take 10 mg by mouth every 8 (eight) hours as needed for nausea or vomiting. Dispense 40 tablets, no reill 07/05/17   Kyung Rudd, MD     Family History  Problem Relation Age of Onset  . Cancer Father     Social History   Social History  . Marital status: Single    Spouse name: N/A  . Number of children: 0  . Years of education: N/A   Social History Main Topics  . Smoking status: Never Smoker  . Smokeless tobacco: Never Used  . Alcohol use Yes     Comment: occasionally/socially  . Drug use: No  . Sexual activity: No   Other Topics Concern  . 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 Never used smokeless tobacco.   No e-cigarettes   Occasional alcohol consumption socially.   Single, no children.    Review of  Systems  Constitutional: Negative for fatigue and fever.  Respiratory: Negative for cough and shortness of breath.   Cardiovascular: Negative for chest pain.  Psychiatric/Behavioral: Negative for behavioral problems and confusion.    Vital Signs: BP (!) 146/67 (BP Location: Right Arm)   Pulse 91   Temp 97.9 F (36.6 C) (Oral)   Resp 16   SpO2 100%   Physical Exam  Constitutional: He is oriented to person, place, and time. He appears well-developed.  Cardiovascular: Normal rate, regular rhythm and normal heart sounds.   Pulmonary/Chest: Effort normal and breath sounds normal.  Neurological: He is alert and oriented to person, place, and time.   Skin: Skin is warm and dry.  Psychiatric: He has a normal mood and affect. His behavior is normal. Judgment and thought content normal.  Nursing note and vitals reviewed.   Mallampati Score:  MD Evaluation Airway: WNL Heart: WNL Abdomen: WNL Chest/ Lungs: WNL ASA  Classification: 3 Mallampati/Airway Score: Two  Imaging: Dg Knee 2 Views Left  Result Date: 06/28/2017 CLINICAL DATA:  Left hip and leg pain which is worsening. EXAM: LEFT KNEE - 1-2 VIEW COMPARISON:  None. FINDINGS: No joint effusion. No joint space narrowing. No significant osteophytes. No sign of avascular necrosis. Benign sclerotic focus in the posterior aspect of the proximal tibia, not significant. IMPRESSION: Negative.  No cause of pain identified. Electronically Signed   By: Nelson Chimes M.D.   On: 06/28/2017 09:59   Mr Thoracic Spine W Wo Contrast  Result Date: 06/28/2017 CLINICAL DATA:  multiple osseous lesions.  Possible myeloma. EXAM: MRI THORACIC AND LUMBAR SPINE WITHOUT AND WITH CONTRAST TECHNIQUE: Multiplanar and multiecho pulse sequences of the thoracic and lumbar spine were obtained without and with intravenous contrast. CONTRAST:  52mL MULTIHANCE GADOBENATE DIMEGLUMINE 529 MG/ML IV SOLN COMPARISON:  CT abdomen pelvis 06/28/2017 FINDINGS: MRI THORACIC SPINE FINDINGS Alignment:  Normal Vertebrae: There are multiple osseous lesions, as seen on the concomitant CT: -- T2, left transverse process -- T6, inferior vertebral body, small lesion -- T7, large lesion involving most of the vertebral body -- T8, large lesion of the left pedicle extending into the left T7-T8 foramen and involving the most proximal aspect of the adjacent rib -- T10, large lesion involving most of the vertebral body. Mild superior endplate compression. --T12, large lesion centered within the right pedicle, involving the posterior vertebral body and lateral aspect of the right lamina, as well as the adjacent proximal rib. This mass also invades the  T11-12 and T12-L1 neural foramina. Cord:  Normal signal and morphology. Paraspinal and other soft tissues: No visible paraspinal or retroperitoneal abnormality. Please see dedicated report for concomitant CT of the abdomen and pelvis. Disc levels: T7-T8: The left neural foramen is severely narrowed by the adjacent soft tissue mass. No central spinal canal or right neural foraminal stenosis. T11-T12: Mild narrowing of the right neural foramen secondary to adjacent soft tissue mass. MRI LUMBAR SPINE FINDINGS Segmentation:  Normal Alignment:  Normal Vertebrae: There is a a large lesion of the L3 vertebral body with an associated burst fracture with approximately 50% height loss and retropulsion measuring 6 mm. There is a small lesion of the right L4 pedicle. Conus medullaris: Extends to the lower L1 level and appears normal. Paraspinal and other soft tissues: Left prevertebral mass at the L1 level measures 3.1 x 1.6 x 3.9 cm. Large para- aortic mass at the level of the left renal vessels measures 5.8 x 4.0 x 9.4 cm. Disc levels: T12-L1:  There is mild right foraminal stenosis due to invasion by the right pedicular soft tissue mass. L1-L2: Normal disc space and facets. No spinal canal or neuroforaminal stenosis. L2-L3: At the inferior aspect of the disc space, there is severe narrowing of the left lateral recess due to retropulsion of the superior endplate of L3. This displaces the descending nerve root. The soft tissue component of the L3 mass is in close proximity to the exiting left L2 root. At the mid L3 level, there is moderate spinal canal stenosis. L3-L4: Moderate left neural foraminal stenosis secondary to invasion by L3 mass. No spinal canal stenosis. L4-L5: Small central disc protrusion without stenosis. L5-S1: Normal disc space and facets. No spinal canal or neuroforaminal stenosis. Visualized sacrum: Lesion within the right sacral ala measures up to 3.4 cm. IMPRESSION: 1. Pathologic burst fracture of the L3  vertebral body with 6 mm of retropulsion and approximately 50% height loss, resulting and severe left lateral recess narrowing at L2-L3, moderate spinal canal stenosis directly posterior to the L3 vertebral body and moderate left L3-4 neural foraminal stenosis. 2. 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 Neural foraminal invasion at T7-T8, T11-T12 and T12-L1. 3. Multiple retroperitoneal soft tissue masses. Electronically Signed   By: Ulyses Jarred M.D.   On: 06/28/2017 15:37   Mr Lumbar Spine W Wo Contrast  Result Date: 06/28/2017 CLINICAL DATA:  multiple osseous lesions.  Possible myeloma. EXAM: MRI THORACIC AND LUMBAR SPINE WITHOUT AND WITH CONTRAST TECHNIQUE: Multiplanar and multiecho pulse sequences of the thoracic and lumbar spine were obtained without and with intravenous contrast. CONTRAST:  91mL MULTIHANCE GADOBENATE DIMEGLUMINE 529 MG/ML IV SOLN COMPARISON:  CT abdomen pelvis 06/28/2017 FINDINGS: MRI THORACIC SPINE FINDINGS Alignment:  Normal Vertebrae: There are multiple osseous lesions, as seen on the concomitant CT: -- T2, left transverse process -- T6, inferior vertebral body, small lesion -- T7, large lesion involving most of the vertebral body -- T8, large lesion of the left pedicle extending into the left T7-T8 foramen and involving the most proximal aspect of the adjacent rib -- T10, large lesion involving most of the vertebral body. Mild superior endplate compression. --T12, large lesion centered within the right pedicle, involving the posterior vertebral body and lateral aspect of the right lamina, as well as the adjacent proximal rib. This mass also invades the T11-12 and T12-L1 neural foramina. Cord:  Normal signal and morphology. Paraspinal and other soft tissues: No visible paraspinal or retroperitoneal abnormality. Please see dedicated report for concomitant CT of the abdomen and pelvis. Disc levels: T7-T8:  The left neural foramen is severely narrowed by the adjacent soft tissue mass. No central spinal canal or right neural foraminal stenosis. T11-T12: Mild narrowing of the right neural foramen secondary to adjacent soft tissue mass. MRI LUMBAR SPINE FINDINGS Segmentation:  Normal Alignment:  Normal Vertebrae: There is a a large lesion of the L3 vertebral body with an associated burst fracture with approximately 50% height loss and retropulsion measuring 6 mm. There is a small lesion of the right L4 pedicle. Conus medullaris: Extends to the lower L1 level and appears normal. Paraspinal and other soft tissues: Left prevertebral mass at the L1 level measures 3.1 x 1.6 x 3.9 cm. Large para- aortic mass at the level of the left renal vessels measures 5.8 x 4.0 x 9.4 cm. Disc levels: T12-L1: There is mild right foraminal stenosis due to invasion by the right  pedicular soft tissue mass. L1-L2: Normal disc space and facets. No spinal canal or neuroforaminal stenosis. L2-L3: At the inferior aspect of the disc space, there is severe narrowing of the left lateral recess due to retropulsion of the superior endplate of L3. This displaces the descending nerve root. The soft tissue component of the L3 mass is in close proximity to the exiting left L2 root. At the mid L3 level, there is moderate spinal canal stenosis. L3-L4: Moderate left neural foraminal stenosis secondary to invasion by L3 mass. No spinal canal stenosis. L4-L5: Small central disc protrusion without stenosis. L5-S1: Normal disc space and facets. No spinal canal or neuroforaminal stenosis. Visualized sacrum: Lesion within the right sacral ala measures up to 3.4 cm. IMPRESSION: 1. Pathologic burst fracture of the L3 vertebral body with 6 mm of retropulsion and approximately 50% height loss, resulting and severe left lateral recess narrowing at L2-L3, moderate spinal canal stenosis directly posterior to the L3 vertebral body and moderate left L3-4 neural foraminal  stenosis. 2. 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 Neural foraminal invasion at T7-T8, T11-T12 and T12-L1. 3. Multiple retroperitoneal soft tissue masses. Electronically Signed   By: Ulyses Jarred M.D.   On: 06/28/2017 15:37   [place] Core Biopsy  Result Date: 06/29/2017 CLINICAL DATA:  Destructive bone lesions including right anterior chest wall mass. EXAM: ULTRASOUND GUIDED CORE BIOPSY OF CHEST WALL MASS MEDICATIONS: 2.0 mg IV Versed; 100 mcg IV Fentanyl Total Moderate Sedation Time: 10 minutes The patient's level of consciousness and physiologic status were continuously monitored during the procedure by Radiology nursing. PROCEDURE: The procedure, risks, benefits, and alternatives were explained to the patient. Questions regarding the procedure were encouraged and answered. The patient understands and consents to the procedure. A time out was performed prior to initiating the procedure. The right anterior chest wall was prepped with chlorhexidine in a sterile fashion, and a sterile drape was applied covering the operative field. A sterile gown and sterile gloves were used for the procedure. Local anesthesia was provided with 1% Lidocaine. Ultrasound was used to localize a right anterior chest wall mass. A 17 gauge needle was advanced to the medial margin of the mass. Five separate 18 gauge core biopsy samples were then obtained through the lesion. Four samples were submitted in formalin and 1 sample submitted in saline. Post biopsy imaging was performed with ultrasound. COMPLICATIONS: None. FINDINGS: Lobulated and hypoechoic soft tissue mass is present in the right anterior chest wall. This measures at least 6 cm in greatest diameter. Solid tissue was obtained. IMPRESSION: Ultrasound-guided core biopsy performed of a right anterior chest wall mass. Electronically Signed   By: Aletta Edouard M.D.   On: 06/29/2017 16:02   Dg Chest  Port 1 View  Result Date: 06/28/2017 CLINICAL DATA:  Evaluate for possible metastatic disease. EXAM: PORTABLE CHEST 1 VIEW COMPARISON:  None. FINDINGS: Cardiac shadow is within normal limits. There is a vague somewhat nodular density identified in the mid left lung. This may be related to a rib lesion as seen on recent MRI examination. No other focal abnormality is noted. IMPRESSION: Vague density in the left mid lung which likely represents a rib lesion as seen on recent MRI. CT of the chest is recommended for further evaluation. Electronically Signed   By: Inez Catalina M.D.   On: 06/28/2017 18:47   Ct Renal Stone Study  Result Date: 06/28/2017 CLINICAL DATA:  Patient with left flank pain.  Difficulty standing. EXAM: CT ABDOMEN AND PELVIS WITHOUT CONTRAST TECHNIQUE: Multidetector CT imaging of the abdomen and pelvis was performed following the standard protocol without IV contrast. COMPARISON:  None. FINDINGS: Lower chest: Normal heart size. Dependent atelectasis within the bilateral lower lobes. Hepatobiliary: Liver is normal in size and contour. Gallbladder is unremarkable. Pancreas: Unremarkable Spleen: Unremarkable Adrenals/Urinary Tract: The adrenal glands are normal. Bilateral renal stones, right-greater-than-left, including a 9 mm stone inferior pole right kidney. Punctate stone identified within the inferior pole of the left kidney. There is a nonobstructing 8 mm stone within the mid aspect of the right kidney (image 45; series 2). Mild left pelviectasis. Urinary bladder is unremarkable. Stomach/Bowel: Small hiatal hernia. Normal morphology of the stomach. No evidence for bowel obstruction. No free fluid or free intraperitoneal air. Normal appendix. Vascular/Lymphatic: Normal caliber abdominal aorta. There is a 6.7 x 5.1 cm soft tissue mass within the left periaortic location (image 35; series 2). Anteriorly there is an additional 3.6 x 3.3 cm left periaortic soft tissue mass (image 44; series 2).  Reproductive: Mildly enlarged prostate. Other: Fat containing left inguinal hernia. Musculoskeletal: There is a 5.6 x 3.9 cm soft tissue mass interspersed between the anterior right sixth and seventh ribs, incompletely included. Patchy lucencies demonstrated within the T10 vertebral body extending to the right posterior elements (image 96; series 6). Mild superior endplate compression deformity at this level. There is a soft tissue mass involving the right posterior elements at the T12 vertebral level which measures 3.9 x 3.6 cm with associated osseous destruction. Soft tissue mass extends into the canal at this level (image 21; series 2). Small oval lucent lesion within the posterior L1 vertebral body. Patchy lucency throughout the L3 vertebral body which is collapsed involving both the superior and inferior endplates. Soft tissue mass extends into the left aspect of the posterior elements as well as into the spinal canal with associated marked narrowing. Soft tissue mass at this location measures up to 4 cm. Patchy lucencies demonstrated within the pubic symphysis bilaterally (image 84; series 2). Lucent lesion within the right ilium with associated soft tissue mass (image 58; series 2). IMPRESSION: Multiple aggressive osseous lesions with associated soft tissue masses demonstrated within the lower thoracic and lumbar spine as well as pelvis and right anterior chest wall. Additionally there are two large retroperitoneal soft tissue masses within the left periaortic location. Overall findings are concerning for metastatic disease or myeloma. Osseous destruction and soft tissue mass involving the L3 vertebral body with approximately 75% collapse at this level. The mass extends to the posterior elements and into the spinal canal were there is marked of narrowing. Osseous destruction of the posterior elements at the T12 vertebral level with extension of the soft tissue mass into the canal at this level. Aggressive  osseous lesion involving the posterior T10 vertebral body extending to the posterior elements. Given these extensive findings involving the spine and spinal canal, consider further evaluation with MRI of the thoracic and lumbar spine. Mild left pelviectasis, potentially secondary to mass effect from the large adjacent retroperitoneal mass. Bilateral nephrolithiasis. Nonobstructing 8 mm stone within the mid right ureter. These results were called by telephone at the time of interpretation on 06/28/2017 at 11:24 am to Dr. Nanda Quinton , who verbally acknowledged these results. Electronically Signed   By: Lovey Newcomer M.D.   On: 06/28/2017 11:30   Dg Hip Unilat W Or Wo Pelvis 2-3 Views Left  Result Date: 06/28/2017  CLINICAL DATA:  Worsening low back and left hip pain. EXAM: DG HIP (WITH OR WITHOUT PELVIS) 2-3V LEFT COMPARISON:  None. FINDINGS: Bones of the pelvis appear normal. Sacroiliac joints and symphysis pubis appear normal. Both hip joints appear normal. No evidence of osteoarthritis, avascular necrosis or other finding. Phleboliths are noted in the pelvis and lower abdomen. IMPRESSION: Negative.  No abnormality seen to explain hip pain. Electronically Signed   By: Nelson Chimes M.D.   On: 06/28/2017 09:59    Labs:  CBC:  Recent Labs  06/28/17 0919 06/29/17 0351 07/16/17 1433 07/26/17 1049  WBC 7.8 6.7 7.0 5.2  HGB 14.1 13.7 13.1 12.3*  HCT 40.7 40.4 38.4 36.1*  PLT 332 354 173 179    COAGS:  Recent Labs  06/29/17 0847 07/26/17 1049  INR 1.06 1.13  APTT  --  29    BMP:  Recent Labs  06/28/17 0919 06/29/17 0351 07/16/17 1433  NA 138 139 132*  K 4.1 4.1 3.9  CL 104 103  --   CO2 23 27 27   GLUCOSE 108* 130* 95  BUN 14 20 25.2  CALCIUM 9.1 10.0 10.0  CREATININE 0.84 0.70 0.9  GFRNONAA >60 >60  --   GFRAA >60 >60  --     LIVER FUNCTION TESTS:  Recent Labs  06/28/17 0919 07/16/17 1433  BILITOT 1.6* 1.27*  AST 45* 44*  ALT 30 148*  ALKPHOS 101 173*  PROT 7.6 7.1    ALBUMIN 3.9 3.7    TUMOR MARKERS: No results for input(s): AFPTM, CEA, CA199, CHROMGRNA in the last 8760 hours.  Assessment and Plan: Patient with no significant past medical history presents with recent diagnosis of diffuse large B-cell lymphoma.  IR consulted for Port-A-Cath placement at the request of Dr. [name]. Patient presents today in their usual state of health.  He has been NPO and is not currently on blood thinners.  Risks and benefits discussed with the patient including, but not limited to bleeding, infection, pneumothorax, or fibrin sheath development and need for additional procedures. All of the patient's questions were answered, patient is agreeable to proceed. Consent signed and in chart.   Thank you for this interesting consult.  I greatly enjoyed meeting Markeis Allman and look forward to participating in their care.  A copy of this report was sent to the requesting provider on this date.  Electronically Signed: Docia Barrier, PA 07/26/2017, 11:15 AM   I spent a total of  30 Minutes   in face to face in clinical consultation, greater than 50% of which was counseling/coordinating care for B-cell lymphoma

## 2017-07-26 NOTE — Discharge Instructions (Signed)
Moderate Conscious Sedation, Adult, Care After °These instructions provide you with information about caring for yourself after your procedure. Your health care provider may also give you more specific instructions. Your treatment has been planned according to current medical practices, but problems sometimes occur. Call your health care provider if you have any problems or questions after your procedure. °What can I expect after the procedure? °After your procedure, it is common: °· To feel sleepy for several hours. °· To feel clumsy and have poor balance for several hours. °· To have poor judgment for several hours. °· To vomit if you eat too soon. ° °Follow these instructions at home: °For at least 24 hours after the procedure: ° °· Do not: °? Participate in activities where you could fall or become injured. °? Drive. °? Use heavy machinery. °? Drink alcohol. °? Take sleeping pills or medicines that cause drowsiness. °? Make important decisions or sign legal documents. °? Take care of children on your own. °· Rest. °Eating and drinking °· Follow the diet recommended by your health care provider. °· If you vomit: °? Drink water, juice, or soup when you can drink without vomiting. °? Make sure you have little or no nausea before eating solid foods. °General instructions °· Have a responsible adult stay with you until you are awake and alert. °· Take over-the-counter and prescription medicines only as told by your health care provider. °· If you smoke, do not smoke without supervision. °· Keep all follow-up visits as told by your health care provider. This is important. °Contact a health care provider if: °· You keep feeling nauseous or you keep vomiting. °· You feel light-headed. °· You develop a rash. °· You have a fever. °Get help right away if: °· You have trouble breathing. °This information is not intended to replace advice given to you by your health care provider. Make sure you discuss any questions you have  with your health care provider. °Document Released: 09/10/2013 Document Revised: 04/24/2016 Document Reviewed: 03/11/2016 °[employer]vier Interactive Patient Education © 2018 [employer] ° ° °Implanted Port Home Guide °An implanted port is a type of central line that is placed under the skin. Central lines are used to provide IV access when treatment or nutrition needs to be given through a person’s veins. Implanted ports are used for long-term IV access. An implanted port may be placed because: °· You need IV medicine that would be irritating to the small veins in your hands or arms. °· You need long-term IV medicines, such as antibiotics. °· You need IV nutrition for a long period. °· You need frequent blood draws for lab tests. °· You need dialysis. ° °Implanted ports are usually placed in the chest area, but they can also be placed in the upper arm, the abdomen, or the leg. An implanted port has two main parts: °· Reservoir. The reservoir is round and will appear as a small, raised area under your skin. The reservoir is the part where a needle is inserted to give medicines or draw blood. °· Catheter. The catheter is a thin, flexible tube that extends from the reservoir. The catheter is placed into a large vein. Medicine that is inserted into the reservoir goes into the catheter and then into the vein. ° °How will I care for my incision site? °Do not get the incision site wet. Bathe or shower as directed by your health care provider. °How is my port accessed? °Special steps must be taken to access the   port: °· Before the port is accessed, a numbing cream can be placed on the skin. This helps numb the skin over the port site. °· Your health care provider uses a sterile technique to access the port. °? Your health care provider must put on a mask and sterile gloves. °? The skin over your port is cleaned carefully with an antiseptic and allowed to dry. °? The port is gently pinched between sterile gloves, and a needle  is inserted into the port. °· Only "non-coring" port needles should be used to access the port. Once the port is accessed, a blood return should be checked. This helps ensure that the port is in the vein and is not clogged. °· If your port needs to remain accessed for a constant infusion, a clear (transparent) bandage will be placed over the needle site. The bandage and needle will need to be changed every week, or as directed by your health care provider. °· Keep the bandage covering the needle clean and dry. Do not get it wet. Follow your health care provider’s instructions on how to take a shower or bath while the port is accessed. °· If your port does not need to stay accessed, no bandage is needed over the port. ° °What is flushing? °Flushing helps keep the port from getting clogged. Follow your health care provider’s instructions on how and when to flush the port. Ports are usually flushed with saline solution or a medicine called heparin. The need for flushing will depend on how the port is used. °· If the port is used for intermittent medicines or blood draws, the port will need to be flushed: °? After medicines have been given. °? After blood has been drawn. °? As part of routine maintenance. °· If a constant infusion is running, the port may not need to be flushed. ° °How long will my port stay implanted? °The port can stay in for as long as your health care provider thinks it is needed. When it is time for the port to come out, surgery will be done to remove it. The procedure is similar to the one performed when the port was put in. °When should I seek immediate medical care? °When you have an implanted port, you should seek immediate medical care if: °· You notice a bad smell coming from the incision site. °· You have swelling, redness, or drainage at the incision site. °· You have more swelling or pain at the port site or the surrounding area. °· You have a fever that is not controlled with  medicine. ° °This information is not intended to replace advice given to you by your health care provider. Make sure you discuss any questions you have with your health care provider. °Document Released: 11/20/2005 Document Revised: 04/27/2016 Document Reviewed: 07/28/2013 °Elsevier Interactive Patient Education © 2017 [employer] ° ° °Implanted Port Insertion, Care After °This sheet gives you information about how to care for yourself after your procedure. Your health care provider may also give you more specific instructions. If you have problems or questions, contact your health care provider. °What can I expect after the procedure? °After your procedure, it is common to have: °· Discomfort at the port insertion site. °· Bruising on the skin over the port. This should improve over 3-4 days. ° °Follow these instructions at home: °Port care °· After your port is placed, you will get a manufacturer's information card. The card has information about your port. Keep this card with   you at all times. °· Take care of the port as told by your health care provider. Ask your health care provider if you or a family member can get training for taking care of the port at home. A home health care nurse may also take care of the port. °· Make sure to remember what type of port you have. °Incision care °· Follow instructions from your health care provider about how to take care of your port insertion site. Make sure you: °? Wash your hands with soap and water before you change your bandage (dressing). If soap and water are not available, use hand sanitizer. °? Change your dressing as told by your health care provider. °? Leave stitches (sutures), skin glue, or adhesive strips in place. These skin closures may need to stay in place for 2 weeks or longer. If adhesive strip edges start to loosen and curl up, you may trim the loose edges. Do not remove adhesive strips completely unless your health care provider tells you to do  that. °· Check your port insertion site every day for signs of infection. Check for: °? More redness, swelling, or pain. °? More fluid or blood. °? Warmth. °? Pus or a bad smell. °General instructions °· Do not take baths, swim, or use a hot tub until your health care provider approves. °· Do not lift anything that is heavier than 10 lb (4.5 kg) for a week, or as told by your health care provider. °· Ask your health care provider when it is okay to: °? Return to work or school. °? Resume usual physical activities or sports. °· Do not drive for 24 hours if you were given a medicine to help you relax (sedative). °· Take over-the-counter and prescription medicines only as told by your health care provider. °· Wear a medical alert bracelet in case of an emergency. This will tell any health care providers that you have a port. °· Keep all follow-up visits as told by your health care provider. This is important. °Contact a health care provider if: °· You cannot flush your port with saline as directed, or you cannot draw blood from the port. °· You have a fever or chills. °· You have more redness, swelling, or pain around your port insertion site. °· You have more fluid or blood coming from your port insertion site. °· Your port insertion site feels warm to the touch. °· You have pus or a bad smell coming from the port insertion site. °Get help right away if: °· You have chest pain or shortness of breath. °· You have bleeding from your port that you cannot control. °Summary °· Take care of the port as told by your health care provider. °· Change your dressing as told by your health care provider. °· Keep all follow-up visits as told by your health care provider. °This information is not intended to replace advice given to you by your health care provider. Make sure you discuss any questions you have with your health care provider. °Document Released: 09/10/2013 Document Revised: 10/11/2016 Document Reviewed:  10/11/2016 °[employer]vier Interactive Patient Education © 2017 [employer] ° ° °

## 2017-07-30 ENCOUNTER — Encounter: Payer: Self-pay | Admitting: Pharmacy Technician

## 2017-07-30 ENCOUNTER — Ambulatory Visit (HOSPITAL_COMMUNITY)
Admission: RE | Admit: 2017-07-30 | Discharge: 2017-07-30 | Disposition: A | Discharge status: Home / Self Care | Payer: Medicaid Other | Source: Ambulatory Visit | Attending: Hematology and Oncology | Admitting: Hematology and Oncology

## 2017-07-30 DIAGNOSIS — N202 Calculus of kidney with calculus of ureter: Secondary | ICD-10-CM | POA: Diagnosis not present

## 2017-07-30 DIAGNOSIS — C8338 Diffuse large B-cell lymphoma, lymph nodes of multiple sites: Secondary | ICD-10-CM | POA: Diagnosis present

## 2017-07-30 DIAGNOSIS — I700 Atherosclerosis of aorta: Secondary | ICD-10-CM | POA: Diagnosis not present

## 2017-07-30 DIAGNOSIS — C7951 Secondary malignant neoplasm of bone: Secondary | ICD-10-CM | POA: Diagnosis not present

## 2017-07-30 MED ORDER — IOPAMIDOL (ISOVUE-300) INJECTION 61%
INTRAVENOUS | Status: AC
  Filled 2017-07-30: qty 100

## 2017-07-30 MED ORDER — IOPAMIDOL (ISOVUE-300) INJECTION 61%
100.0000 mL | Freq: Once | INTRAVENOUS | Status: AC | PRN
Start: 2017-07-30 — End: 2017-07-30
  Administered 2017-07-30: 100 mL via INTRAVENOUS

## 2017-07-30 NOTE — Progress Notes (Addendum)
The patient is approved by [employer] for Neulasta and Xgeva, coverage from 07/28/17 - 07/28/18. Enrollment is based on self-pay pending approval by Medicaid.

## 2017-08-01 ENCOUNTER — Encounter: Payer: Self-pay | Admitting: Pharmacy Technician

## 2017-08-01 NOTE — Progress Notes (Signed)
The patient is also approved for drug assistance by [employer] for Xgeva.

## 2017-08-03 ENCOUNTER — Other Ambulatory Visit: Payer: Self-pay | Admitting: Oncology

## 2017-08-03 MED ORDER — OXYCODONE HCL 5 MG PO TABS: 5.0000 mg | ORAL_TABLET | ORAL | 0 refills | Status: DC | PRN

## 2017-08-07 ENCOUNTER — Telehealth: Payer: Self-pay | Admitting: Emergency Medicine

## 2017-08-07 ENCOUNTER — Other Ambulatory Visit: Payer: Self-pay | Admitting: *Deleted

## 2017-08-07 DIAGNOSIS — C8330 Diffuse large B-cell lymphoma, unspecified site: Secondary | ICD-10-CM

## 2017-08-07 NOTE — Telephone Encounter (Signed)
Pt walked in today c/o of feet swelling and headache starting in back of right side of head to the frontal lobe. Pt denies dizziness and blurred vision. Pts feet are do show edema bi-lat. Pt states it has been like this for a week now. Dr.Perlov not here at this time. Spoke to [place], [place] regarding patient. Instructed him to go to ED if swelling becomes worse and if blurred vision or dizziness appear or head ache worsens. Other wise, Lucianne Lei will see him tomorrow as add on to Symptom management schedule. Scheduling message sent to schedulers for 1 o'clock lab and 1:30 visit with Lucianne Lei. Patient verbalized understanding.

## 2017-08-08 ENCOUNTER — Other Ambulatory Visit: Payer: Self-pay | Admitting: Medical

## 2017-08-08 ENCOUNTER — Other Ambulatory Visit (HOSPITAL_BASED_OUTPATIENT_CLINIC_OR_DEPARTMENT_OTHER): Payer: Medicaid Other

## 2017-08-08 ENCOUNTER — Ambulatory Visit (HOSPITAL_BASED_OUTPATIENT_CLINIC_OR_DEPARTMENT_OTHER): Payer: Self-pay | Admitting: Medical

## 2017-08-08 VITALS — BP 149/74 | HR 100 | Temp 98.2°F | Resp 20 | Ht 74.0 in | Wt 179.2 lb

## 2017-08-08 DIAGNOSIS — M545 Low back pain: Secondary | ICD-10-CM

## 2017-08-08 DIAGNOSIS — R600 Localized edema: Secondary | ICD-10-CM

## 2017-08-08 DIAGNOSIS — C8338 Diffuse large B-cell lymphoma, lymph nodes of multiple sites: Secondary | ICD-10-CM | POA: Diagnosis present

## 2017-08-08 DIAGNOSIS — R21 Rash and other nonspecific skin eruption: Secondary | ICD-10-CM

## 2017-08-08 DIAGNOSIS — R51 Headache: Secondary | ICD-10-CM

## 2017-08-08 DIAGNOSIS — M899 Disorder of bone, unspecified: Secondary | ICD-10-CM

## 2017-08-08 LAB — COMPREHENSIVE METABOLIC PANEL
ALBUMIN: 3.4 g/dL — AB (ref 3.5–5.0)
ALK PHOS: 106 U/L (ref 40–150)
ALT: 22 U/L (ref 0–55)
ANION GAP: 10 meq/L (ref 3–11)
AST: 22 U/L (ref 5–34)
BUN: 12.3 mg/dL (ref 7.0–26.0)
CO2: 27 meq/L (ref 22–29)
Calcium: 10.6 mg/dL — ABNORMAL HIGH (ref 8.4–10.4)
Chloride: 100 mEq/L (ref 98–109)
Creatinine: 0.8 mg/dL (ref 0.7–1.3)
Glucose: 101 mg/dl (ref 70–140)
POTASSIUM: 3.5 meq/L (ref 3.5–5.1)
Sodium: 137 mEq/L (ref 136–145)
TOTAL PROTEIN: 6.9 g/dL (ref 6.4–8.3)
Total Bilirubin: 0.85 mg/dL (ref 0.20–1.20)

## 2017-08-08 LAB — MAGNESIUM: MAGNESIUM: 2.1 mg/dL (ref 1.5–2.5)

## 2017-08-08 LAB — CBC WITH DIFFERENTIAL/PLATELET
BASO%: 0 % (ref 0.0–2.0)
BASOS ABS: 0 10*3/uL (ref 0.0–0.1)
EOS ABS: 0 10*3/uL (ref 0.0–0.5)
EOS%: 0.4 % (ref 0.0–7.0)
HCT: 36.6 % — ABNORMAL LOW (ref 38.4–49.9)
HGB: 12.2 g/dL — ABNORMAL LOW (ref 13.0–17.1)
LYMPH%: 20.6 % (ref 14.0–49.0)
MCH: 25.4 pg — AB (ref 27.2–33.4)
MCHC: 33.3 g/dL (ref 32.0–36.0)
MCV: 76.3 fL — AB (ref 79.3–98.0)
MONO#: 0.3 10*3/uL (ref 0.1–0.9)
MONO%: 6 % (ref 0.0–14.0)
NEUT%: 73 % (ref 39.0–75.0)
NEUTROS ABS: 3.6 10*3/uL (ref 1.5–6.5)
PLATELETS: 255 10*3/uL (ref 140–400)
RBC: 4.8 10*6/uL (ref 4.20–5.82)
RDW: 16.3 % — ABNORMAL HIGH (ref 11.0–14.6)
WBC: 4.9 10*3/uL (ref 4.0–10.3)
lymph#: 1 10*3/uL (ref 0.9–3.3)

## 2017-08-08 LAB — TECHNOLOGIST REVIEW

## 2017-08-08 LAB — LACTATE DEHYDROGENASE: LDH: 309 U/L — AB (ref 125–245)

## 2017-08-08 LAB — URIC ACID: Uric Acid, Serum: 3.7 mg/dl (ref 2.6–7.4)

## 2017-08-08 MED ORDER — PREDNISONE 20 MG PO TABS: 60.0000 mg | ORAL_TABLET | Freq: Every day | ORAL | 0 refills | Status: AC

## 2017-08-08 NOTE — Progress Notes (Signed)
[facility] Progress Note   Cory Ibarra [id number] 1960/12/18 56 y.o.  Cory Ibarra is managed by Dr. Grace Isaac  Actively treated with chemotherapy: no  Current Therapy: R-CHOP planned to begin 08/09/2017. The patient will also receive Xgeva.  Last Treated: n/a  Assessment/Plan:   Diffuse large B-cell lymphoma of lymph nodes of multiple regions ([place]) - Plan: predniSONE (DELTASONE) 20 MG tablet  Patient is instructed to take his acyclovir and allopurinol every day rather than every other day. The patient will return on 08/09/2017 for cycle 1 of R-CHOP The patient will see Dr. [name]   Please see After Visit Summary for patient specific instructions.  Future Appointments Date Time Provider [place]  08/09/2017 8:45 AM [facility] ONLY [facility] None  08/09/2017 9:00 AM [facility] FLUSH NURSE 2 [facility] None  08/09/2017 9:40 AM [name], Marinell Blight, MD [facility] None  08/09/2017 10:45 AM [facility] C9 [facility] None  08/23/2017 1:30 PM Hayden Pedro, PA-C [facility] None  09/05/2017 10:00 AM [name], [name] [facility] None    No orders of the defined types were placed in this encounter.      Subjective:   Patient ID:  Cory Ibarra is a 56 y.o. (DOB 07-14-1961) male.  Chief Complaint:  Chief Complaint  Patient presents with  . Edema    HPI Cory Ibarra is a 56 year old male with a history of a with a new diagnosis of a high grade diffuse large B-cell lymphoma (NHL) (positive for CD20, CD10, bcl-6, bcl-2, Ki-67 86%). He presents to the office today with a report of bilateral lower extremity edema mainly in his feet for the last 7 days. He reports multiple small red areas over his feet. He has a history of migraine headaches and has had daily headaches mainly in the morning for the last 7 days. His headaches resolve with Aleve and with massaging his neck. He has been taking oxycodone for his back pain and headache. He began prescription for  acyclovir on allopurinol which he has only been taking every other day due to it upsetting his stomach.  The patient is a 56 year old male who presented to the emergency room on 06/18/2017 initially with a report of lower back pain. Re reported that he had been having intermittent symptoms for about 1 month. Over the last couple of days prior to his presentation to the emergency room his pain had begun to radiate to his left hip and knee. On the evening prior to his evaluation his pain became increasingly severe and developed into a throbbing pain in his left hip and knee. He reported that his thigh and knee felt numb at times. He denied weakness, abdominal pain, fevers, chills, and no recent injuries. He did report that he did help a friend move on the weekend prior to his emergency room presentation. He did not believe that he lifted anything that was heavy. An x-ray of the lumbar spine was completed which showed no evidence of a fracture with normal alignment and normal intervertebral disc spaces. An incidental finding of an appendicolith was noted. The patient was diagnosed and treated for sciatica.  Cory Ibarra returned to the emergency room on 06/28/2017 after completing his treatment for sciatica with his pain recurring. He also had developed sweats during the day and night. A CT scan of his abdomen was completed at this visit and showed the following: 1) Multiple aggressive osseous lesions with associated soft tissue masses in the lower thoracic and lumbar spine as well  as the pelvis and right anterior chest wall. Additionally there were 2 large retroperitoneal soft tissue masses within the left periaortic location. 2) Osseous destruction and soft tissue mass involving the L3 vertebral body with approximately 75% collapse at that level. The mass extended to the posterior elements and into the central canal where there was marked narrowing. 3) Osseous destruction of the posterior elements at the T12  vertebral level with extension of the soft tissue mass into the canal.  4) Mild left pelviectasis, small hiatal hernia, no evidence of obstruction or free fluid or free air, 6.7 x 5.1 cm soft tissue mass within the left periaortic location, 3.6 x 3.3 cm left periaortic soft tissue mass, mildly enlarged prostate, 5.6 x 3.9 cm soft tissue mass interspersed between the anterior right sixth and seventh ribs.  5) Patchy lucency is demonstrated within the T10 vertebral body extending to the right posterior elements. Mild superior endplate compression deformity at this level. 6) Soft tissue mass involving the right posterior elements at the T12 vertebral level measuring 3.9 x 3.6 cm with associated osseous destruction. Soft tissue mass extends into the canal at this level. 7) Small oval lucent lesion within the posterior L1 vertebral body. 8) Patchy lucencies throughout the L3 vertebral body which is collapsed involving both the superior and inferior endplates. Soft tissue mass extends into the left aspect of the posterior elements as well as into the spinal canal with associated marked narrowing. Soft tissue mass at this location measured up to 4 cm. 9) Patchy lucencies demonstrated within the pubic symphysis bilaterally. 10) Lucent lesions with and the right ilium and associated soft tissue mass. 11) Bilateral nephrolithiasis. Nonobstructing 8 mm stone within the right mid ureter. The patient was taken for an MRI of the thoracic and lumbar spine with results returning showing: Thoracic Spine W Wo Contrast (06/28/2017)  1) Multiple osseous lesions, as seen on the concomitant CT. 2) T7, large lesion involving most of the vertebral body 3) T8, large lesion of the left pedicle extending into the left T7-T8 foramen and involving the most proximal aspect of the adjacent rib. 4) T10, large lesion involving most of the vertebral body. Mild superior endplate compression.  5) T12, large lesion centered within the  right pedicle, involving the posterior vertebral body and lateral aspect of the right lamina, as well as the adjacent proximal rib. This mass also invades the T11-12 and T12-L1 neural foramina.  6) Cord:  Normal signal and morphology. Paraspinal and other soft tissues: No visible paraspinal or retroperitoneal abnormality.  7) Disc levels: T7-T8: The left neural foramen is severely narrowed by the adjacent soft tissue mass. No central spinal canal or right neural foraminal stenosis. T11-T12: Mild narrowing of the right neural foramen secondary to adjacent soft tissue mass.   MRI LUMBAR SPINE FINDINGS Segmentation:    1) There is a large lesion of the L3 vertebral body with an associated burst fracture with approximately 50% height loss and retropulsion measuring 6 mm.  2) There is a small lesion of the right L4 pedicle.  3) Conus medullaris: Extends to the lower L1 level and appears normal.  4) Paraspinal and other soft tissues: Left prevertebral mass at the L1 level measures 3.1 x 1.6 x 3.9 cm. Large para- aortic mass at the level of the left renal vessels measures 5.8 x 4.0 x 9.4 cm.  5) Disc levels: T12-L1: Mild right foraminal stenosis due to invasion by the right pedicular soft tissue mass. L1-L2: Normal disc space  and facets. No spinal canal or neuroforaminal stenosis. L2-L3: At the inferior aspect of the disc space, there is severe narrowing of the left lateral recess due to retropulsion of the superior endplate of L3. This displaces the descending nerve root. The soft tissue component of the L3 mass is in close proximity to the exiting left L2 root. At the mid L3 level, there is moderate spinal canal stenosis. L3-L4: Moderate left neural foraminal stenosis secondary to invasion by L3 mass. No spinal canal stenosis. L4-L5: Small central disc protrusion without stenosis. L5-S1: Normal disc space and facets. No spinal canal or neuroforaminal stenosis.  6) Visualized sacrum: Lesion within the right  sacral ala measures up to 3.4 cm.   The agent was seen by Dr. Gunnar Bulla Magrinat in consult and then was seen by Dr. Grace Isaac initially on 07/16/2017.   A core biopsy of a right anterior chest wall mass was completed on 06/29/2017 with pathology ([id number].[id number]) returning showing:  Histologic type: Non-Hodgkin B-cell lymphoma: Diffuse large B-cell lymphoma. Grade (if applicable): High grade. Flow cytometry: [id number] reveals a monoclonal population of B-cells with expression of CD10. Comments: Core biopsies reveal sheets of medium to large atypical lymphocytes with irregular nuclear contours and small nucleoli. There is frequent apoptotic debris. Immunohistochemistry reveals the cells are positive for CD20, CD10, bcl-6, and bcl-2. CD23 does not reveal follicular dendritic networks. Ki-67 is elevated (86%). CD3 and CD5 highlight scattered T-cells. Flow cytometry reveals a monoclonal B-cell population with expression of CD10. Overall, the findings are consistent with a diffuse large B-cell lymphoma of germinal center origin.  An echocardiogram was completed on 07/19/2017 which returned with an LVEF of 50 to 55%.  CT/PET was not ordered.  Plans are for cycle 1 of R-CHOP to be dosed on 08/09/2017.  Medications: I have reviewed the patient's current medications.  Allergies:  Allergies  Allergen Reactions  . Bee Venom Swelling    Past Medical History:  Diagnosis Date  . Cancer ([place])   . Tendonitis     Past Surgical History:  Procedure Laterality Date  . HERNIA REPAIR  1988   Dr. Vida Rigger  . IR FLUORO GUIDE PORT INSERTION RIGHT  07/26/2017  . IR US GUIDE VASC ACCESS RIGHT  07/26/2017    Family History  Problem Relation Age of Onset  . Cancer Father     Social History   Social History  . Marital status: Single    Spouse name: N/A  . Number of children: 0  . Years of education: N/A   Occupational History  . Not on file.   Social History Main Topics  . Smoking status:  Never Smoker  . Smokeless tobacco: Never Used  . Alcohol use Yes     Comment: occasionally/socially  . Drug use: No  . Sexual activity: No   Other Topics Concern  . 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 Never used smokeless tobacco.   No e-cigarettes   Occasional alcohol consumption socially.   Single, no children.    Past Medical History, Surgical history, Social history, and Family history were reviewed and updated as appropriate.   Please see review of systems for further details on the patient's review from today.   Review of Systems:  Review of Systems  Constitutional: Positive for activity change and diaphoresis. Negative for appetite change, chills, fatigue and fever.  HENT: Negative for trouble swallowing.   Respiratory: Negative for  cough, chest tightness and shortness of breath.   Cardiovascular: Positive for leg swelling. Negative for chest pain.  Gastrointestinal: Negative for abdominal pain, constipation, diarrhea and nausea.  Musculoskeletal: Positive for back pain and gait problem.  Skin: Positive for rash (Multiple small diffuse erythematous areas over the dorsal surface of the foot bilaterally).  Neurological: Positive for headaches.    Objective:   Physical Exam:  BP (!) 149/74 (BP Location: Left Arm, Patient Position: Sitting)   Pulse 100   Temp 98.2 F (36.8 C) (Oral)   Resp 20   Ht 6' 2"  (1.88 m)   Wt 179 lb 3.2 oz (81.3 kg)   SpO2 100%   BMI 23.01 kg/m  ECOG: 2  Physical Exam  Constitutional: No distress.  HENT:  Head: Normocephalic and atraumatic.  Mouth/Throat: Oropharynx is clear and moist. No oropharyngeal exudate.  Cardiovascular: Normal rate, regular rhythm and normal heart sounds.  Exam reveals no gallop and no friction rub.   No murmur heard. Pulmonary/Chest: Effort normal and breath sounds normal. No respiratory distress. He has no wheezes. He has no rales.    The patient has a brace over the torso.  Musculoskeletal: He exhibits edema.       Feet:  Lymphadenopathy:    He has no cervical adenopathy.  Neurological: Coordination (Patient is ambulating with a rolling walker) abnormal.  Skin: He is not diaphoretic.    Lab Review:     Component Value Date/Time   NA 137 08/08/2017 1329   K 3.5 08/08/2017 1329   CL 94 (L) 07/26/2017 1049   CO2 27 08/08/2017 1329   GLUCOSE 101 08/08/2017 1329   BUN 12.3 08/08/2017 1329   CREATININE 0.8 08/08/2017 1329   CALCIUM 10.6 (H) 08/08/2017 1329   PROT 6.9 08/08/2017 1329   ALBUMIN 3.4 (L) 08/08/2017 1329   AST 22 08/08/2017 1329   ALT 22 08/08/2017 1329   ALKPHOS 106 08/08/2017 1329   BILITOT 0.85 08/08/2017 1329   GFRNONAA >60 07/26/2017 1049   GFRAA >60 07/26/2017 1049       Component Value Date/Time   WBC 4.9 08/08/2017 1329   WBC 5.2 07/26/2017 1049   RBC 4.80 08/08/2017 1329   RBC 4.87 07/26/2017 1049   HGB 12.2 (L) 08/08/2017 1329   HCT 36.6 (L) 08/08/2017 1329   PLT 255 08/08/2017 1329   MCV 76.3 (L) 08/08/2017 1329   MCH 25.4 (L) 08/08/2017 1329   MCH 25.3 (L) 07/26/2017 1049   MCHC 33.3 08/08/2017 1329   MCHC 34.1 07/26/2017 1049   RDW 16.3 (H) 08/08/2017 1329   LYMPHSABS 1.0 08/08/2017 1329   MONOABS 0.3 08/08/2017 1329   EOSABS 0.0 08/08/2017 1329   BASOSABS 0.0 08/08/2017 1329   -------------------------------  Imaging from last 24 hours (if applicable):  Radiology interpretation: Ct Chest W Contrast  Result Date: 07/30/2017 CLINICAL DATA:  Followup diffuse large B-cell non-Hodgkin's lymphoma diagnosed in July 2018 with bony metastases. Status post radiation therapy to the chest and a pathological L-spine fracture. EXAM: CT CHEST, ABDOMEN, AND PELVIS WITH CONTRAST TECHNIQUE: Multidetector CT imaging of the chest, abdomen and pelvis was performed following the standard protocol during bolus administration of intravenous contrast. CONTRAST:  138m ISOVUE-300 IOPAMIDOL  (ISOVUE-300) INJECTION 61% COMPARISON:  Thoracic and lumbar spine MR dated 06/28/2017 and abdomen and pelvis CT dated 06/28/2017. FINDINGS: CT CHEST FINDINGS Cardiovascular: Normal sized heart. Small amount of aortic calcification. Right jugular porta catheter tip in the inferior aspect of the superior vena cava. Mediastinum/Nodes:  No enlarged lymph nodes. Normal appearing esophagus and thyroid gland. Lungs/Pleura: Stable linear scarring at the right lung base. No lung nodules or pleural fluid. Musculoskeletal: Left anterior chest wall mass arising from the anterior aspect of the left third rib. This measures 4.9 x 3.4 cm on image number 36 series 2. Right anterior chest wall mass arising from the anterior aspect of the sixth rib, measuring 5.9 x 3.0 cm on image number 53 series 2, previously 5.6 x 3.9 cm on 06/28/2017. Smaller lytic and expansile lesion in the lateral aspect of the left third rib on images 19 and 20 series 2. Similar changes involving the posterior aspect of the left seventh rib on image number 31 series 2. This appears smaller compared to the MR dated 06/28/2017. Similar expansile lesion involving the posteromedial aspect of the left scapula on image number 31 series 2. The previously demonstrated lytic lesion in the right T10 vertebral body and posterior elements demonstrates some interval calcification without significant change in extent and with interval superior endplate compression. The previously demonstrated lytic, expansile lesion involving the right vertebral body and posterior elements at the T12 level is smaller without well-visualized tumor in the canal on the right, improved. There is interval mild collapse of the posterior aspect of the vertebral body on the right. CT ABDOMEN PELVIS FINDINGS Hepatobiliary: Distended gallbladder.  Normal appearing liver. Pancreas: Unremarkable. No pancreatic ductal dilatation or surrounding inflammatory changes. Spleen: Normal in size without focal  abnormality. Adrenals/Urinary Tract: 4 mm calculus in the mid right kidney and adjacent 5 mm calculi in the lower right kidney. Additional smaller calculus in the mid to upper right kidney. The right kidney remains smaller than the left kidney with no left renal calculi seen. There is a 10 mm proximal right ureteral calculus with no dilatation of the right renal collecting system. No left ureteral calculi or bladder calculi seen. No distal right ureteral calculi visualized. Multiple bilateral pelvic phleboliths are again demonstrated. Stomach/Bowel: Stomach is within normal limits. Appendix appears normal. No evidence of bowel wall thickening, distention, or inflammatory changes. Vascular/Lymphatic: No vascular calcifications are aneurysm. The previously demonstrated 6.7 x 5.1 cm left para-aortic retroperitoneal lymph node mass is much smaller, currently measuring 3.1 x 1.7 cm on image number 81 series 2. A more inferior left para-aortic node previously measuring 3.6 x 3.3 cm currently measures 1.8 x 1.2 cm on image number 89 series 2. No new or enlarging lymph nodes are seen. Reproductive: Prostate is unremarkable. Other: Proximal left inguinal hernia containing fat. No free peritoneal fluid. Musculoskeletal: The previously demonstrated left L3 vertebral destructive mass is significantly smaller with decreased size of the previously demonstrated component within the spinal canal. There is also mildly progressive collapse and sclerosis of the L3 vertebral body. There has been an increase in size of a similar-appearing mass in anterior aspect of the right iliac bone, currently measuring 4.0 x 3.4 cm on image number 103 series 2. There is also been increase in size of a similar-appearing mass in the right pubic body, currently measuring approximately 3.1 x 2.8 cm on image number 125 series 2, extending into the symphysis pubis. IMPRESSION: 1. Significantly improved retroperitoneal adenopathy on the left. 2. Extensive  bony metastatic disease, including bilateral ribs, left scapula, thoracic and lumbar vertebrae, right iliac bone and right pubic bone. Some of these are improved following treatment and others are larger were not previously imaged. 3. 10 mm proximal right ureteral calculus without hydronephrosis. 4. Multiple nonobstructing right renal calculi.  5. Mild aortic atherosclerosis. Electronically Signed   By: Claudie Revering M.D.   On: 07/30/2017 16:35   Ct Abdomen Pelvis W Contrast  Result Date: 07/30/2017 CLINICAL DATA:  Followup diffuse large B-cell non-Hodgkin's lymphoma diagnosed in July 2018 with bony metastases. Status post radiation therapy to the chest and a pathological L-spine fracture. EXAM: CT CHEST, ABDOMEN, AND PELVIS WITH CONTRAST TECHNIQUE: Multidetector CT imaging of the chest, abdomen and pelvis was performed following the standard protocol during bolus administration of intravenous contrast. CONTRAST:  183m ISOVUE-300 IOPAMIDOL (ISOVUE-300) INJECTION 61% COMPARISON:  Thoracic and lumbar spine MR dated 06/28/2017 and abdomen and pelvis CT dated 06/28/2017. FINDINGS: CT CHEST FINDINGS Cardiovascular: Normal sized heart. Small amount of aortic calcification. Right jugular porta catheter tip in the inferior aspect of the superior vena cava. Mediastinum/Nodes: No enlarged lymph nodes. Normal appearing esophagus and thyroid gland. Lungs/Pleura: Stable linear scarring at the right lung base. No lung nodules or pleural fluid. Musculoskeletal: Left anterior chest wall mass arising from the anterior aspect of the left third rib. This measures 4.9 x 3.4 cm on image number 36 series 2. Right anterior chest wall mass arising from the anterior aspect of the sixth rib, measuring 5.9 x 3.0 cm on image number 53 series 2, previously 5.6 x 3.9 cm on 06/28/2017. Smaller lytic and expansile lesion in the lateral aspect of the left third rib on images 19 and 20 series 2. Similar changes involving the posterior aspect of  the left seventh rib on image number 31 series 2. This appears smaller compared to the MR dated 06/28/2017. Similar expansile lesion involving the posteromedial aspect of the left scapula on image number 31 series 2. The previously demonstrated lytic lesion in the right T10 vertebral body and posterior elements demonstrates some interval calcification without significant change in extent and with interval superior endplate compression. The previously demonstrated lytic, expansile lesion involving the right vertebral body and posterior elements at the T12 level is smaller without well-visualized tumor in the canal on the right, improved. There is interval mild collapse of the posterior aspect of the vertebral body on the right. CT ABDOMEN PELVIS FINDINGS Hepatobiliary: Distended gallbladder.  Normal appearing liver. Pancreas: Unremarkable. No pancreatic ductal dilatation or surrounding inflammatory changes. Spleen: Normal in size without focal abnormality. Adrenals/Urinary Tract: 4 mm calculus in the mid right kidney and adjacent 5 mm calculi in the lower right kidney. Additional smaller calculus in the mid to upper right kidney. The right kidney remains smaller than the left kidney with no left renal calculi seen. There is a 10 mm proximal right ureteral calculus with no dilatation of the right renal collecting system. No left ureteral calculi or bladder calculi seen. No distal right ureteral calculi visualized. Multiple bilateral pelvic phleboliths are again demonstrated. Stomach/Bowel: Stomach is within normal limits. Appendix appears normal. No evidence of bowel wall thickening, distention, or inflammatory changes. Vascular/Lymphatic: No vascular calcifications are aneurysm. The previously demonstrated 6.7 x 5.1 cm left para-aortic retroperitoneal lymph node mass is much smaller, currently measuring 3.1 x 1.7 cm on image number 81 series 2. A more inferior left para-aortic node previously measuring 3.6 x 3.3 cm  currently measures 1.8 x 1.2 cm on image number 89 series 2. No new or enlarging lymph nodes are seen. Reproductive: Prostate is unremarkable. Other: Proximal left inguinal hernia containing fat. No free peritoneal fluid. Musculoskeletal: The previously demonstrated left L3 vertebral destructive mass is significantly smaller with decreased size of the previously demonstrated component within the spinal  canal. There is also mildly progressive collapse and sclerosis of the L3 vertebral body. There has been an increase in size of a similar-appearing mass in anterior aspect of the right iliac bone, currently measuring 4.0 x 3.4 cm on image number 103 series 2. There is also been increase in size of a similar-appearing mass in the right pubic body, currently measuring approximately 3.1 x 2.8 cm on image number 125 series 2, extending into the symphysis pubis. IMPRESSION: 1. Significantly improved retroperitoneal adenopathy on the left. 2. Extensive bony metastatic disease, including bilateral ribs, left scapula, thoracic and lumbar vertebrae, right iliac bone and right pubic bone. Some of these are improved following treatment and others are larger were not previously imaged. 3. 10 mm proximal right ureteral calculus without hydronephrosis. 4. Multiple nonobstructing right renal calculi. 5. Mild aortic atherosclerosis. Electronically Signed   By: Claudie Revering M.D.   On: 07/30/2017 16:35   Ir US Guide Vasc Access Right  Result Date: 07/26/2017 INDICATION: 56 year old with diffuse large B-cell lymphoma. Port-A-Cath needed for treatment. EXAM: FLUOROSCOPIC AND ULTRASOUND GUIDED PLACEMENT OF A SUBCUTANEOUS PORT COMPARISON:  None. MEDICATIONS: Ancef 2 g; The antibiotic was administered within an appropriate time interval prior to skin puncture. ANESTHESIA/SEDATION: Versed 4 mg IV; Fentanyl 100 mcg IV; Moderate Sedation Time:  29 minutes The patient was continuously monitored during the procedure by the interventional  radiology nurse under my direct supervision. FLUOROSCOPY TIME:  30 seconds, 3 mGy COMPLICATIONS: None immediate. PROCEDURE: The procedure, risks, benefits, and alternatives were explained to the patient. Questions regarding the procedure were encouraged and answered. The patient understands and consents to the procedure. Patient was placed supine on the interventional table. Ultrasound confirmed a patent right internal jugular vein. The right chest and neck were cleaned with a skin antiseptic and a sterile drape was placed. Maximal barrier sterile technique was utilized including caps, mask, sterile gowns, sterile gloves, sterile drape, hand hygiene and skin antiseptic. The right neck was anesthetized with 1% lidocaine. Small incision was made in the right neck with a blade. Micropuncture set was placed in the right internal jugular vein with ultrasound guidance. The micropuncture wire was used for measurement purposes. The right chest was anesthetized with 1% lidocaine with epinephrine. #15 blade was used to make an incision and a subcutaneous port pocket was formed. [place] was assembled. Subcutaneous tunnel was formed with a stiff tunneling device. The port catheter was brought through the subcutaneous tunnel. The port was placed in the subcutaneous pocket. The micropuncture set was exchanged for a peel-away sheath. The catheter was placed through the peel-away sheath and the tip was positioned at the superior cavoatrial junction. Catheter placement was confirmed with fluoroscopy. The port was accessed and flushed with heparinized saline. The port pocket was closed using two layers of absorbable sutures and Dermabond. The vein skin site was closed using a single layer of absorbable suture and Dermabond. Sterile dressings were applied. Patient tolerated the procedure well without an immediate complication. Ultrasound and fluoroscopic images were taken and saved for this procedure. IMPRESSION: Placement  of a subcutaneous port device. Electronically Signed   By: Markus Daft M.D.   On: 07/26/2017 13:50   Ir Fluoro Guide Port Insertion Right  Result Date: 07/26/2017 INDICATION: 56 year old with diffuse large B-cell lymphoma. Port-A-Cath needed for treatment. EXAM: FLUOROSCOPIC AND ULTRASOUND GUIDED PLACEMENT OF A SUBCUTANEOUS PORT COMPARISON:  None. MEDICATIONS: Ancef 2 g; The antibiotic was administered within an appropriate time interval prior to skin puncture.  ANESTHESIA/SEDATION: Versed 4 mg IV; Fentanyl 100 mcg IV; Moderate Sedation Time:  29 minutes The patient was continuously monitored during the procedure by the interventional radiology nurse under my direct supervision. FLUOROSCOPY TIME:  30 seconds, 3 mGy COMPLICATIONS: None immediate. PROCEDURE: The procedure, risks, benefits, and alternatives were explained to the patient. Questions regarding the procedure were encouraged and answered. The patient understands and consents to the procedure. Patient was placed supine on the interventional table. Ultrasound confirmed a patent right internal jugular vein. The right chest and neck were cleaned with a skin antiseptic and a sterile drape was placed. Maximal barrier sterile technique was utilized including caps, mask, sterile gowns, sterile gloves, sterile drape, hand hygiene and skin antiseptic. The right neck was anesthetized with 1% lidocaine. Small incision was made in the right neck with a blade. Micropuncture set was placed in the right internal jugular vein with ultrasound guidance. The micropuncture wire was used for measurement purposes. The right chest was anesthetized with 1% lidocaine with epinephrine. #15 blade was used to make an incision and a subcutaneous port pocket was formed. Cory Ibarra was assembled. Subcutaneous tunnel was formed with a stiff tunneling device. The port catheter was brought through the subcutaneous tunnel. The port was placed in the subcutaneous pocket. The  micropuncture set was exchanged for a peel-away sheath. The catheter was placed through the peel-away sheath and the tip was positioned at the superior cavoatrial junction. Catheter placement was confirmed with fluoroscopy. The port was accessed and flushed with heparinized saline. The port pocket was closed using two layers of absorbable sutures and Dermabond. The vein skin site was closed using a single layer of absorbable suture and Dermabond. Sterile dressings were applied. Patient tolerated the procedure well without an immediate complication. Ultrasound and fluoroscopic images were taken and saved for this procedure. IMPRESSION: Placement of a subcutaneous port device. Electronically Signed   By: Markus Daft M.D.   On: 07/26/2017 13:50        This patient was seen with Dr. Alen Blew with my treatment plan reviewed with him. He expressed agreement with my medical management of this patient.   Patient is seen and examined personally today. He reports feet swelling without any calf tenderness. The swelling is bilateral and not associated with any other symptoms. He denied any chest pain, shortness of breath or orthopnea. His echocardiogram showed adequate EF.  On physical exam, alert, awake gentleman without distress. Vitals were stable without any abnormalities. His heart was regular rate and lungs are clear. Abdomen was soft without tenderness and his extremities showed bilateral pitting edema at the level of the ankle.  His laboratory data, CT scan were reviewed as well.  His edema does appear to be rather mild and could be related to his lymphadenopathy. I have recommended conservative management at this time including feet elevation and compression stockings. I anticipate improvement in his edema as we treat his lymphoma.  This was a showed visit with advanced practice provider Sandi Mealy, PA-C  Zola Button MD 08/09/17

## 2017-08-09 ENCOUNTER — Ambulatory Visit (HOSPITAL_BASED_OUTPATIENT_CLINIC_OR_DEPARTMENT_OTHER): Payer: Medicaid Other

## 2017-08-09 ENCOUNTER — Encounter: Payer: Self-pay | Admitting: Hematology and Oncology

## 2017-08-09 ENCOUNTER — Ambulatory Visit: Payer: Medicaid Other

## 2017-08-09 ENCOUNTER — Other Ambulatory Visit: Payer: Medicaid Other

## 2017-08-09 ENCOUNTER — Ambulatory Visit (HOSPITAL_BASED_OUTPATIENT_CLINIC_OR_DEPARTMENT_OTHER): Payer: Medicaid Other | Admitting: Hematology and Oncology

## 2017-08-09 VITALS — BP 136/62 | HR 95 | Temp 98.4°F | Resp 16

## 2017-08-09 VITALS — BP 142/66 | HR 88 | Temp 98.3°F | Resp 18 | Ht 74.0 in | Wt 173.8 lb

## 2017-08-09 DIAGNOSIS — Z5112 Encounter for antineoplastic immunotherapy: Secondary | ICD-10-CM | POA: Diagnosis not present

## 2017-08-09 DIAGNOSIS — C8338 Diffuse large B-cell lymphoma, lymph nodes of multiple sites: Secondary | ICD-10-CM

## 2017-08-09 DIAGNOSIS — C7951 Secondary malignant neoplasm of bone: Secondary | ICD-10-CM

## 2017-08-09 DIAGNOSIS — Z5111 Encounter for antineoplastic chemotherapy: Secondary | ICD-10-CM

## 2017-08-09 LAB — HEPATITIS B E ANTIGEN: Hep B E Ag: NEGATIVE

## 2017-08-09 LAB — PHOSPHORUS: PHOSPHORUS: 2.8 mg/dL (ref 2.5–4.5)

## 2017-08-09 LAB — HEPATITIS B DNA, ULTRAQUANTITATIVE, PCR: HBV DNA SERPL PCR-ACNC: NOT DETECTED [IU]/mL

## 2017-08-09 LAB — HEPATITIS B CORE ANTIBODY, IGM: Hep B Core Ab, IgM: NEGATIVE

## 2017-08-09 MED ORDER — DENOSUMAB 120 MG/1.7ML ~~LOC~~ SOLN
120.0000 mg | Freq: Once | SUBCUTANEOUS | Status: AC
  Administered 2017-08-09: 120 mg via SUBCUTANEOUS
  Filled 2017-08-09: qty 1.7

## 2017-08-09 MED ORDER — SODIUM CHLORIDE 0.9% FLUSH
10.0000 mL | INTRAVENOUS | Status: DC | PRN
  Administered 2017-08-09 (×2): 10 mL
  Filled 2017-08-09: qty 10

## 2017-08-09 MED ORDER — SODIUM CHLORIDE 0.9 % IV SOLN
375.0000 mg/m2 | Freq: Once | INTRAVENOUS | Status: AC
  Administered 2017-08-09: 800 mg via INTRAVENOUS
  Filled 2017-08-09: qty 50

## 2017-08-09 MED ORDER — DOXORUBICIN HCL CHEMO IV INJECTION 2 MG/ML
50.0000 mg/m2 | Freq: Once | INTRAVENOUS | Status: AC
  Administered 2017-08-09: 106 mg via INTRAVENOUS
  Filled 2017-08-09: qty 53

## 2017-08-09 MED ORDER — DIPHENHYDRAMINE HCL 25 MG PO CAPS
50.0000 mg | ORAL_CAPSULE | Freq: Once | ORAL | Status: AC
  Administered 2017-08-09: 50 mg via ORAL

## 2017-08-09 MED ORDER — PALONOSETRON HCL INJECTION 0.25 MG/5ML
INTRAVENOUS | Status: AC
  Filled 2017-08-09: qty 5

## 2017-08-09 MED ORDER — SODIUM CHLORIDE 0.9 % IV SOLN
750.0000 mg/m2 | Freq: Once | INTRAVENOUS | Status: AC
  Administered 2017-08-09: 1580 mg via INTRAVENOUS
  Filled 2017-08-09: qty 79

## 2017-08-09 MED ORDER — DEXAMETHASONE SODIUM PHOSPHATE 10 MG/ML IJ SOLN
10.0000 mg | Freq: Once | INTRAMUSCULAR | Status: AC
  Administered 2017-08-09: 10 mg via INTRAVENOUS

## 2017-08-09 MED ORDER — ACETAMINOPHEN 325 MG PO TABS
ORAL_TABLET | ORAL | Status: AC
  Filled 2017-08-09: qty 2

## 2017-08-09 MED ORDER — SODIUM CHLORIDE 0.9 % IV SOLN
Freq: Once | INTRAVENOUS | Status: AC
  Administered 2017-08-09: 11:00:00 via INTRAVENOUS

## 2017-08-09 MED ORDER — PALONOSETRON HCL INJECTION 0.25 MG/5ML
0.2500 mg | Freq: Once | INTRAVENOUS | Status: AC
  Administered 2017-08-09: 0.25 mg via INTRAVENOUS

## 2017-08-09 MED ORDER — ACETAMINOPHEN 325 MG PO TABS
650.0000 mg | ORAL_TABLET | Freq: Once | ORAL | Status: AC
  Administered 2017-08-09: 650 mg via ORAL

## 2017-08-09 MED ORDER — HEPARIN SOD (PORK) LOCK FLUSH 100 UNIT/ML IV SOLN
500.0000 [IU] | Freq: Once | INTRAVENOUS | Status: AC | PRN
Start: 2017-08-09 — End: 2017-08-09
  Administered 2017-08-09: 500 [IU]
  Filled 2017-08-09: qty 5

## 2017-08-09 MED ORDER — DIPHENHYDRAMINE HCL 25 MG PO CAPS
ORAL_CAPSULE | ORAL | Status: AC
  Filled 2017-08-09: qty 2

## 2017-08-09 MED ORDER — VINCRISTINE SULFATE CHEMO INJECTION 1 MG/ML
2.0000 mg | Freq: Once | INTRAVENOUS | Status: AC
  Administered 2017-08-09: 2 mg via INTRAVENOUS
  Filled 2017-08-09: qty 2

## 2017-08-09 MED ORDER — DEXAMETHASONE SODIUM PHOSPHATE 10 MG/ML IJ SOLN
INTRAMUSCULAR | Status: AC
  Filled 2017-08-09: qty 1

## 2017-08-09 NOTE — Patient Instructions (Signed)
Thank you for choosing [facility] to provide your oncology and hematology care.  To afford each patient quality time with our providers, please arrive 30 minutes before your scheduled appointment time.  If you arrive late for your appointment, you may be asked to reschedule.  We strive to give you quality time with our providers, and arriving late affects you and other patients whose appointments are after yours.   If you are a no show for multiple scheduled visits, you may be dismissed from the clinic at the providers discretion.    Again, thank you for choosing [facility], our hope is that these requests will decrease the amount of time that you wait before being seen by our physicians.  ______________________________________________________________________  Should you have questions after your visit to the [facility], please contact our office at [phone] between the hours of 8:30 and 4:30 p.m.    Voicemails left after 4:30p.m will not be returned until the following business day.    For prescription refill requests, please have your pharmacy contact us directly.  Please also try to allow 48 hours for prescription requests.    Please contact the scheduling department for questions regarding scheduling.  For scheduling of procedures such as PET scans, CT scans, MRI, Ultrasound, etc please contact central scheduling at [phone].    Resources For Cancer Patients and Caregivers:   [employer]colink[employer]org:  A wonderful resource for patients and healthcare providers for information regarding your disease, ways to tract your treatment, what to expect, etc.     [employer]:  800-227-2345  Can help patients locate various types of support and financial assistance  Cancer Care: 1-800-813-HOPE (4673) Provides financial assistance, online support groups, medication/co-pay assistance.    [place] DSS:  336-641-3447 Where to apply for food  stamps, Medicaid, and utility assistance  Medicare Rights Center: 800-333-4114 Helps people with Medicare understand their rights and benefits, navigate the Medicare system, and secure the quality healthcare they deserve  [employer]: 336-333-6589 [employer] [employer]'s shared-ride transportation service for eligible riders who have a disability that prevents them from riding the fixed route bus.    For additional information on assistance programs please contact our social worker:   Grier Hock/Abigail Elmore:  [phone]            

## 2017-08-09 NOTE — Patient Instructions (Signed)
[facility] Discharge Instructions for Patients Receiving Chemotherapy  Today you received the following chemotherapy agents:  Adriamycin, Vincristine, Cytoxan, and Rituxan.  To help prevent nausea and vomiting after your treatment, we encourage you to take your nausea medication as directed.   If you develop nausea and vomiting that is not controlled by your nausea medication, call the clinic.   BELOW ARE SYMPTOMS THAT SHOULD BE REPORTED IMMEDIATELY:  *FEVER GREATER THAN 100.5 F  *CHILLS WITH OR WITHOUT FEVER  NAUSEA AND VOMITING THAT IS NOT CONTROLLED WITH YOUR NAUSEA MEDICATION  *UNUSUAL SHORTNESS OF BREATH  *UNUSUAL BRUISING OR BLEEDING  TENDERNESS IN MOUTH AND THROAT WITH OR WITHOUT PRESENCE OF ULCERS  *URINARY PROBLEMS  *BOWEL PROBLEMS  UNUSUAL RASH Items with * indicate a potential emergency and should be followed up as soon as possible.  Feel free to call the clinic you have any questions or concerns. The clinic phone number is [phone].  Please show the [facility] at check-in to the Emergency Department and triage nurse.  Doxorubicin injection What is this medicine? DOXORUBICIN (dox oh ROO bi sin) is a chemotherapy drug. It is used to treat many kinds of cancer like leukemia, lymphoma, neuroblastoma, sarcoma, and Wilms' tumor. It is also used to treat bladder cancer, breast cancer, lung cancer, ovarian cancer, stomach cancer, and thyroid cancer. This medicine may be used for other purposes; ask your health care provider or pharmacist if you have questions. COMMON BRAND NAME(S): Adriamycin, Adriamycin PFS, Adriamycin RDF, Rubex What should I tell my health care provider before I take this medicine? They need to know if you have any of these conditions: -heart disease -history of low blood counts caused by a medicine -liver disease -recent or ongoing radiation therapy -an unusual or allergic reaction to doxorubicin, other chemotherapy  agents, other medicines, foods, dyes, or preservatives -pregnant or trying to get pregnant -breast-feeding How should I use this medicine? This drug is given as an infusion into a vein. It is administered in a hospital or clinic by a specially trained health care professional. If you have pain, swelling, burning or any unusual feeling around the site of your injection, tell your health care professional right away. Talk to your pediatrician regarding the use of this medicine in children. Special care may be needed. Overdosage: If you think you have taken too much of this medicine contact a poison control center or emergency room at once. NOTE: This medicine is only for you. Do not share this medicine with others. What if I miss a dose? It is important not to miss your dose. Call your doctor or health care professional if you are unable to keep an appointment. What may interact with this medicine? This medicine may interact with the following medications: -6-mercaptopurine -paclitaxel -phenytoin -St. Ty[place]un's Wort -trastuzumab -verapamil This list may not describe all possible interactions. Give your health care provider a list of all the medicines, herbs, non-prescription drugs, or dietary supplements you use. Also tell them if you smoke, drink alcohol, or use illegal drugs. Some items may interact with your medicine. What should I watch for while using this medicine? This drug may make you feel generally unwell. This is not uncommon, as chemotherapy can affect healthy cells as well as cancer cells. Report any side effects. Continue your course of treatment even though you feel ill unless your doctor tells you to stop. There is a maximum amount of this medicine you should receive throughout your life. The amount  depends on the medical condition being treated and your overall health. Your doctor will watch how much of this medicine you receive in your lifetime. Tell your doctor if you have taken  this medicine before. You may need blood work done while you are taking this medicine. Your urine may turn red for a few days after your dose. This is not blood. If your urine is dark or brown, call your doctor. In some cases, you may be given additional medicines to help with side effects. Follow all directions for their use. Call your doctor or health care professional for advice if you get a fever, chills or sore throat, or other symptoms of a cold or flu. Do not treat yourself. This drug decreases your body's ability to fight infections. Try to avoid being around people who are sick. This medicine may increase your risk to bruise or bleed. Call your doctor or health care professional if you notice any unusual bleeding. Talk to your doctor about your risk of cancer. You may be more at risk for certain types of cancers if you take this medicine. Do not become pregnant while taking this medicine or for 6 months after stopping it. Women should inform their doctor if they wish to become pregnant or think they might be pregnant. Men should not father a child while taking this medicine and for 6 months after stopping it. There is a potential for serious side effects to an unborn child. Talk to your health care professional or pharmacist for more information. Do not breast-feed an infant while taking this medicine. This medicine has caused ovarian failure in some women and reduced sperm counts in some men This medicine may interfere with the ability to have a child. Talk with your doctor or health care professional if you are concerned about your fertility. What side effects may I notice from receiving this medicine? Side effects that you should report to your doctor or health care professional as soon as possible: -allergic reactions like skin rash, itching or hives, swelling of the face, lips, or tongue -breathing problems -chest pain -fast or irregular heartbeat -low blood counts - this medicine may  decrease the number of white blood cells, red blood cells and platelets. You may be at increased risk for infections and bleeding. -pain, redness, or irritation at site where injected -signs of infection - fever or chills, cough, sore throat, pain or difficulty passing urine -signs of decreased platelets or bleeding - bruising, pinpoint red spots on the skin, black, tarry stools, blood in the urine -swelling of the ankles, feet, hands -tiredness -weakness Side effects that usually do not require medical attention (report to your doctor or health care professional if they continue or are bothersome): -diarrhea -hair loss -mouth sores -nail discoloration or damage -nausea -red colored urine -vomiting This list may not describe all possible side effects. Call your doctor for medical advice about side effects. You may report side effects to FDA at 1-800-FDA-1088. Where should I keep my medicine? This drug is given in a hospital or clinic and will not be stored at home. NOTE: This sheet is a summary. It may not cover all possible information. If you have questions about this medicine, talk to your doctor, pharmacist, or health care provider.  2018 Elsevier/Gold Standard (2016-01-17 11:28:51)  Vincristine injection What is this medicine? VINCRISTINE (vin KRIS teen) is a chemotherapy drug. It slows the growth of cancer cells. This medicine is used to treat many types of cancer like Hodgkin's  disease, leukemia, non-Hodgkin's lymphoma, neuroblastoma (brain cancer), rhabdomyosarcoma, and Wilms' tumor. This medicine may be used for other purposes; ask your health care provider or pharmacist if you have questions. COMMON BRAND NAME(S): Oncovin, Vincasar PFS What should I tell my health care provider before I take this medicine? They need to know if you have any of these conditions: -blood disorders -gout -infection (especially chickenpox, cold sores, or herpes) -kidney disease -liver  disease -lung disease -nervous system disease like Charcot-Marie-Tooth (CMT) -recent or ongoing radiation therapy -an unusual or allergic reaction to vincristine, other chemotherapy agents, other medicines, foods, dyes, or preservatives -pregnant or trying to get pregnant -breast-feeding How should I use this medicine? This drug is given as an infusion into a vein. It is administered in a hospital or clinic by a specially trained health care professional. If you have pain, swelling, burning, or any unusual feeling around the site of your injection, tell your health care professional right away. Talk to your pediatrician regarding the use of this medicine in children. While this drug may be prescribed for selected conditions, precautions do apply. Overdosage: If you think you have taken too much of this medicine contact a poison control center or emergency room at once. NOTE: This medicine is only for you. Do not share this medicine with others. What if I miss a dose? It is important not to miss your dose. Call your doctor or health care professional if you are unable to keep an appointment. What may interact with this medicine? Do not take this medicine with any of the following medications: -itraconazole -mibefradil -voriconazole This medicine may also interact with the following medications: -cyclosporine -erythromycin -fluconazole -ketoconazole -medicines for HIV like delavirdine, efavirenz, nevirapine -medicines for seizures like ethotoin, fosphenotoin, phenytoin -medicines to increase blood counts like filgrastim, pegfilgrastim, sargramostim -other chemotherapy drugs like cisplatin, L-asparaginase, methotrexate, mitomycin, paclitaxel -pegaspargase -vaccines -zalcitabine, ddC Talk to your doctor or health care professional before taking any of these medicines: -acetaminophen -aspirin -ibuprofen -ketoprofen -naproxen This list may not describe all possible interactions. Give  your health care provider a list of all the medicines, herbs, non-prescription drugs, or dietary supplements you use. Also tell them if you smoke, drink alcohol, or use illegal drugs. Some items may interact with your medicine. What should I watch for while using this medicine? Your condition will be monitored carefully while you are receiving this medicine. You will need important blood work done while you are taking this medicine. This drug may make you feel generally unwell. This is not uncommon, as chemotherapy can affect healthy cells as well as cancer cells. Report any side effects. Continue your course of treatment even though you feel ill unless your doctor tells you to stop. In some cases, you may be given additional medicines to help with side effects. Follow all directions for their use. Call your doctor or health care professional for advice if you get a fever, chills or sore throat, or other symptoms of a cold or flu. Do not treat yourself. Avoid taking products that contain aspirin, acetaminophen, ibuprofen, naproxen, or ketoprofen unless instructed by your doctor. These medicines may hide a fever. Do not become pregnant while taking this medicine. Women should inform their doctor if they wish to become pregnant or think they might be pregnant. There is a potential for serious side effects to an unborn child. Talk to your health care professional or pharmacist for more information. Do not breast-feed an infant while taking this medicine. Men may have a  lower sperm count while taking this medicine. Talk to your doctor if you plan to father a child. What side effects may I notice from receiving this medicine? Side effects that you should report to your doctor or health care professional as soon as possible: -allergic reactions like skin rash, itching or hives, swelling of the face, lips, or tongue -breathing problems -confusion or changes in emotions or moods -constipation -cough -mouth  sores -muscle weakness -nausea and vomiting -pain, swelling, redness or irritation at the injection site -pain, tingling, numbness in the hands or feet -problems with balance, talking, walking -seizures -stomach pain -trouble passing urine or change in the amount of urine Side effects that usually do not require medical attention (report to your doctor or health care professional if they continue or are bothersome): -diarrhea -hair loss -jaw pain -loss of appetite This list may not describe all possible side effects. Call your doctor for medical advice about side effects. You may report side effects to FDA at 1-800-FDA-1088. Where should I keep my medicine? This drug is given in a hospital or clinic and will not be stored at home. NOTE: This sheet is a summary. It may not cover all possible information. If you have questions about this medicine, talk to your doctor, pharmacist, or health care provider.  2018 Elsevier/Gold Standard (2008-08-17 17:17:13)  Cyclophosphamide injection What is this medicine? CYCLOPHOSPHAMIDE (sye kloe FOSS fa mide) is a chemotherapy drug. It slows the growth of cancer cells. This medicine is used to treat many types of cancer like lymphoma, myeloma, leukemia, breast cancer, and ovarian cancer, to name a few. This medicine may be used for other purposes; ask your health care provider or pharmacist if you have questions. COMMON BRAND NAME(S): Cytoxan, Neosar What should I tell my health care provider before I take this medicine? They need to know if you have any of these conditions: -blood disorders -history of other chemotherapy -infection -kidney disease -liver disease -recent or ongoing radiation therapy -tumors in the bone marrow -an unusual or allergic reaction to cyclophosphamide, other chemotherapy, other medicines, foods, dyes, or preservatives -pregnant or trying to get pregnant -breast-feeding How should I use this medicine? This drug is  usually given as an injection into a vein or muscle or by infusion into a vein. It is administered in a hospital or clinic by a specially trained health care professional. Talk to your pediatrician regarding the use of this medicine in children. Special care may be needed. Overdosage: If you think you have taken too much of this medicine contact a poison control center or emergency room at once. NOTE: This medicine is only for you. Do not share this medicine with others. What if I miss a dose? It is important not to miss your dose. Call your doctor or health care professional if you are unable to keep an appointment. What may interact with this medicine? This medicine may interact with the following medications: -amiodarone -amphotericin B -azathioprine -certain antiviral medicines for HIV or AIDS such as protease inhibitors (e.g., indinavir, ritonavir) and zidovudine -certain blood pressure medications such as benazepril, captopril, enalapril, fosinopril, lisinopril, moexipril, monopril, perindopril, quinapril, ramipril, trandolapril -certain cancer medications such as anthracyclines (e.g., daunorubicin, doxorubicin), busulfan, cytarabine, paclitaxel, pentostatin, tamoxifen, trastuzumab -certain diuretics such as chlorothiazide, chlorthalidone, hydrochlorothiazide, indapamide, metolazone -certain medicines that treat or prevent blood clots like warfarin -certain muscle relaxants such as succinylcholine -cyclosporine -etanercept -indomethacin -medicines to increase blood counts like filgrastim, pegfilgrastim, sargramostim -medicines used as general anesthesia -metronidazole -natalizumab  This list may not describe all possible interactions. Give your health care provider a list of all the medicines, herbs, non-prescription drugs, or dietary supplements you use. Also tell them if you smoke, drink alcohol, or use illegal drugs. Some items may interact with your medicine. What should I watch for  while using this medicine? Visit your doctor for checks on your progress. This drug may make you feel generally unwell. This is not uncommon, as chemotherapy can affect healthy cells as well as cancer cells. Report any side effects. Continue your course of treatment even though you feel ill unless your doctor tells you to stop. Drink water or other fluids as directed. Urinate often, even at night. In some cases, you may be given additional medicines to help with side effects. Follow all directions for their use. Call your doctor or health care professional for advice if you get a fever, chills or sore throat, or other symptoms of a cold or flu. Do not treat yourself. This drug decreases your body's ability to fight infections. Try to avoid being around people who are sick. This medicine may increase your risk to bruise or bleed. Call your doctor or health care professional if you notice any unusual bleeding. Be careful brushing and flossing your teeth or using a toothpick because you may get an infection or bleed more easily. If you have any dental work done, tell your dentist you are receiving this medicine. You may get drowsy or dizzy. Do not drive, use machinery, or do anything that needs mental alertness until you know how this medicine affects you. Do not become pregnant while taking this medicine or for 1 year after stopping it. Women should inform their doctor if they wish to become pregnant or think they might be pregnant. Men should not father a child while taking this medicine and for 4 months after stopping it. There is a potential for serious side effects to an unborn child. Talk to your health care professional or pharmacist for more information. Do not breast-feed an infant while taking this medicine. This medicine may interfere with the ability to have a child. This medicine has caused ovarian failure in some women. This medicine has caused reduced sperm counts in some men. You should talk  with your doctor or health care professional if you are concerned about your fertility. If you are going to have surgery, tell your doctor or health care professional that you have taken this medicine. What side effects may I notice from receiving this medicine? Side effects that you should report to your doctor or health care professional as soon as possible: -allergic reactions like skin rash, itching or hives, swelling of the face, lips, or tongue -low blood counts - this medicine may decrease the number of white blood cells, red blood cells and platelets. You may be at increased risk for infections and bleeding. -signs of infection - fever or chills, cough, sore throat, pain or difficulty passing urine -signs of decreased platelets or bleeding - bruising, pinpoint red spots on the skin, black, tarry stools, blood in the urine -signs of decreased red blood cells - unusually weak or tired, fainting spells, lightheadedness -breathing problems -dark urine -dizziness -palpitations -swelling of the ankles, feet, hands -trouble passing urine or change in the amount of urine -weight gain -yellowing of the eyes or skin Side effects that usually do not require medical attention (report to your doctor or health care professional if they continue or are bothersome): -changes in nail or  skin color -hair loss -missed menstrual periods -mouth sores -nausea, vomiting This list may not describe all possible side effects. Call your doctor for medical advice about side effects. You may report side effects to FDA at 1-800-FDA-1088. Where should I keep my medicine? This drug is given in a hospital or clinic and will not be stored at home. NOTE: This sheet is a summary. It may not cover all possible information. If you have questions about this medicine, talk to your doctor, pharmacist, or health care provider.  2018 Elsevier/Gold Standard (2012-10-04 16:22:58)  Rituximab injection What is this  medicine? RITUXIMAB (ri TUX i mab) is a monoclonal antibody. It is used to treat certain types of cancer like non-Hodgkin lymphoma and chronic lymphocytic leukemia. It is also used to treat rheumatoid arthritis, granulomatosis with polyangiitis (or Wegener's granulomatosis), and microscopic polyangiitis. This medicine may be used for other purposes; ask your health care provider or pharmacist if you have questions. COMMON BRAND NAME(S): Rituxan What should I tell my health care provider before I take this medicine? They need to know if you have any of these conditions: -heart disease -infection (especially a virus infection such as hepatitis B, chickenpox, cold sores, or herpes) -immune system problems -irregular heartbeat -kidney disease -lung or breathing disease, like asthma -recently received or scheduled to receive a vaccine -an unusual or allergic reaction to rituximab, mouse proteins, other medicines, foods, dyes, or preservatives -pregnant or trying to get pregnant -breast-feeding How should I use this medicine? This medicine is for infusion into a vein. It is administered in a hospital or clinic by a specially trained health care professional. A special MedGuide will be given to you by the pharmacist with each prescription and refill. Be sure to read this information carefully each time. Talk to your pediatrician regarding the use of this medicine in children. This medicine is not approved for use in children. Overdosage: If you think you have taken too much of this medicine contact a poison control center or emergency room at once. NOTE: This medicine is only for you. Do not share this medicine with others. What if I miss a dose? It is important not to miss a dose. Call your doctor or health care professional if you are unable to keep an appointment. What may interact with this medicine? -cisplatin -other medicines for arthritis like disease modifying antirheumatic drugs or tumor  necrosis factor inhibitors -live virus vaccines This list may not describe all possible interactions. Give your health care provider a list of all the medicines, herbs, non-prescription drugs, or dietary supplements you use. Also tell them if you smoke, drink alcohol, or use illegal drugs. Some items may interact with your medicine. What should I watch for while using this medicine? Your condition will be monitored carefully while you are receiving this medicine. You may need blood work done while you are taking this medicine. This medicine can cause serious allergic reactions. To reduce your risk you may need to take medicine before treatment with this medicine. Take your medicine as directed. In some patients, this medicine may cause a serious brain infection that may cause death. If you have any problems seeing, thinking, speaking, walking, or standing, tell your doctor right away. If you cannot reach your doctor, urgently seek other source of medical care. Call your doctor or health care professional for advice if you get a fever, chills or sore throat, or other symptoms of a cold or flu. Do not treat yourself. This drug decreases your  body's ability to fight infections. Try to avoid being around people who are sick. Do not become pregnant while taking this medicine or for 12 months after stopping it. Women should inform their doctor if they wish to become pregnant or think they might be pregnant. There is a potential for serious side effects to an unborn child. Talk to your health care professional or pharmacist for more information. What side effects may I notice from receiving this medicine? Side effects that you should report to your doctor or health care professional as soon as possible: -breathing problems -chest pain -dizziness or feeling faint -fast, irregular heartbeat -low blood counts - this medicine may decrease the number of white blood cells, red blood cells and platelets. You may be  at increased risk for infections and bleeding. -mouth sores -redness, blistering, peeling or loosening of the skin, including inside the mouth (this can be added for any serious or exfoliative rash that could lead to hospitalization) -signs of infection - fever or chills, cough, sore throat, pain or difficulty passing urine -signs and symptoms of kidney injury like trouble passing urine or change in the amount of urine -signs and symptoms of liver injury like dark yellow or brown urine; general ill feeling or flu-like symptoms; light-colored stools; loss of appetite; nausea; right upper belly pain; unusually weak or tired; yellowing of the eyes or skin -stomach pain -vomiting Side effects that usually do not require medical attention (report to your doctor or health care professional if they continue or are bothersome): -headache -joint pain -muscle cramps or muscle pain This list may not describe all possible side effects. Call your doctor for medical advice about side effects. You may report side effects to FDA at 1-800-FDA-1088. Where should I keep my medicine? This drug is given in a hospital or clinic and will not be stored at home. NOTE: This sheet is a summary. It may not cover all possible information. If you have questions about this medicine, talk to your doctor, pharmacist, or health care provider.  2018 Elsevier/Gold Standard (2016-06-28 15:28:09)

## 2017-08-10 ENCOUNTER — Telehealth: Payer: Self-pay | Admitting: *Deleted

## 2017-08-10 ENCOUNTER — Encounter: Payer: Self-pay | Admitting: Pharmacy Technician

## 2017-08-10 NOTE — Telephone Encounter (Signed)
SW pt regarding chemo f/u.  Pt states he is feeling well.  No complaints.  Denies N/V, Diarrhea, fever/chills.  Pt feeling better than expected/thankful for call.

## 2017-08-10 NOTE — Assessment & Plan Note (Signed)
56 year old male with new diagnosis of diffuse large B-cell lymphoma, GCB subtype involving multifocal skeletal sites and multiple node regions. Initial presentation with severe left hip and knee due to extensive disease involving the skeletal structures of the spine with multilevel compression fractures. Some spinal canal intrusion without obvious cord compression. Additionally, bulky retroperitoneal lymphadenopathy and a soft tissue mass in the thoracic wall. Staging evaluation is incomplete at this point in time, but due to presence of extensive skeletal disease and soft tissue mass in the chest wall, patient is already stage IV.  Patient was initially treated with steroids and palliative radiation to the spine which resulted in significant symptomatic improvement. Presently, patient is ready to initiate systemic curative-intent chemotherapy with rituximab, doxorubicin, vincristine, cyclophosphamide (R-CHOP). Pretreatment workup included echocardiogram obtained on 07/19/17 demonstrating left ventricular ejection fraction of 50-55%, patient underwent lab work testing which returned back positive for core antibodies for hepatitis B virus. Additional testing was negative for IgM antibodies and negative for hepatitis B DNA. These findings are consistent with either previous infection for which the patient has completely recovered, or previous history of immunization patient does not recall having either.   With the findings of hepatitis B antibodies, I have conducted additional conversation with the patient regarding the potential additional risk of reactivation of the viral infection due to rituximab function. Risks discussed included fulminant hepatitis with hepatic failure. Patient still agrees to proceed with the planned treatment as rituximab at significant therapeutic benefit which I think outweighs the risk at this point in time.  This cycle will be administered without intrathecal prophylaxis due to  high volume of the disease with expectations of tumor lysis syndrome. It is likely that I will recommend adding intrathecal methotrexate prophylaxis with the subsequent cycles of therapy based on the advanced stage disease including extra lymphatic disease and skeletal involvement. Patient will start receiving methotrexate 12 mg with each cycle of [name] starting with the second.  Plan: --Continue Allopurinol 300mg  PO QDay --Continue Acyclovir  --Proceed with Cycle #1/6 of R-CHOP. We will consider intrathecal methotrexate for CNS relapse prophylaxis as there is direct invasion into the spinal canal by the tumors and CNS recurrence is a significant risk. We'll likely start prophylaxis starting the second cycle of systemic chemotherapy. --Denosumab therapy Q6wks due to extensive skeletal involvement --RTC 1 week for tumor lysis syndrome monitoring and toxicity monitoring

## 2017-08-10 NOTE — Telephone Encounter (Signed)
-----   Message from Sinda Du, RN sent at 08/09/2017 10:28 AM EDT ----- Regarding: Dr. [name] - 1st chemo f/u 1st chemo f/u call

## 2017-08-10 NOTE — Progress Notes (Signed)
[facility] Follow-up Visit:  Assessment: Diffuse large B cell lymphoma ([place]) 56 year old male with new diagnosis of diffuse large B-cell lymphoma, GCB subtype involving multifocal skeletal sites and multiple node regions. Initial presentation with severe left hip and knee due to extensive disease involving the skeletal structures of the spine with multilevel compression fractures. Some spinal canal intrusion without obvious cord compression. Additionally, bulky retroperitoneal lymphadenopathy and a soft tissue mass in the thoracic wall. Staging evaluation is incomplete at this point in time, but due to presence of extensive skeletal disease and soft tissue mass in the chest wall, patient is already stage IV.  Patient was initially treated with steroids and palliative radiation to the spine which resulted in significant symptomatic improvement. Presently, patient is ready to initiate systemic curative-intent chemotherapy with rituximab, doxorubicin, vincristine, cyclophosphamide (R-CHOP). Pretreatment workup included echocardiogram obtained on 07/19/17 demonstrating left ventricular ejection fraction of 50-55%, patient underwent lab work testing which returned back positive for core antibodies for hepatitis B virus. Additional testing was negative for IgM antibodies and negative for hepatitis B DNA. These findings are consistent with either previous infection for which the patient has completely recovered, or previous history of immunization patient does not recall having either.   With the findings of hepatitis B antibodies, I have conducted additional conversation with the patient regarding the potential additional risk of reactivation of the viral infection due to rituximab function. Risks discussed included fulminant hepatitis with hepatic failure. Patient still agrees to proceed with the planned treatment as rituximab at significant therapeutic benefit which I think outweighs the  risk at this point in time.  This cycle will be administered without intrathecal prophylaxis due to high volume of the disease with expectations of tumor lysis syndrome. It is likely that I will recommend adding intrathecal methotrexate prophylaxis with the subsequent cycles of therapy based on the advanced stage disease including extra lymphatic disease and skeletal involvement. Patient will start receiving methotrexate 12 mg with each cycle of [name] starting with the second.  Plan: --Continue Allopurinol 366m PO QDay --Continue Acyclovir  --Proceed with Cycle #1/6 of R-CHOP. We will consider intrathecal methotrexate for CNS relapse prophylaxis as there is direct invasion into the spinal canal by the tumors and CNS recurrence is a significant risk. We'll likely start prophylaxis starting the second cycle of systemic chemotherapy. --Denosumab therapy Q6wks due to extensive skeletal involvement --RTC 1 week for tumor lysis syndrome monitoring and toxicity monitoring  Voice recognition software was used and creation of this note. Despite my best effort at editing the text, some misspelling/errors may have occurred.  Orders Placed This Encounter  Procedures  . CBC with Differential    Standing Status:   Future    Standing Expiration Date:   08/09/2018  . Comprehensive metabolic panel    Standing Status:   Future    Standing Expiration Date:   08/09/2018  . Uric acid    Standing Status:   Future    Standing Expiration Date:   08/09/2018  . Phosphorus    Standing Status:   Future    Standing Expiration Date:   08/09/2018  . Magnesium    Standing Status:   Future    Standing Expiration Date:   08/09/2018    Cancer Staging Diffuse large B cell lymphoma ([place] Staging form: Hodgkin and Non-Hodgkin Lymphoma, AJCC 8th Edition - Clinical stage from 07/16/2017: Stage IV (Diffuse large B-cell lymphoma) - Signed by PArdath Sax MD on 07/18/2017  All questions were answered.  . The patient knows  to call the clinic with any problems, questions or concerns.  This note was electronically signed.    History of Presenting Illness Cory Ibarra is a 57 y.o. male followed in the [facility] for diagnosis of diffuse large B-cell lymphoma. Patient initially presented on 06/28/17 with progressive pain in the left hip and knee at least 6 week duration. This was accompanied by decreased appetite, decreased activity due to the pain. Patient was not closely followed by physician for an extended period of time due to lack of insurance.  Initially presented the week prior and was diagnosed with sciatica and treated with steroids. Steroids resulted in symptomatic improvement, but the pain recurred rapidly after completion of a taper. Approximately at the time of onset of the pain in the left lower extremity, patient will start noticing mild abdominal discomfort. This has been associated with loss of appetite for a month and some weight loss. You, dysphasia, early satiety, diarrhea, constipation, melena, or hematochezia. Initial imaging evaluation revealed multifocal lytic lesions in the skeletal structures as well as pelvic mass. Oncology was consulted and patient was seen by Dr Marjie Skiff. Based on his recommendations, additional imaging was obtained, she was started on steroids, neurosurgery and radiation oncology were consulted. Patient was started on palliative radiation therapy to the lumbar spine which resulted in significant symptomatic improvement.  Since last visit to the clinic, patient underwent additional assessment with CT of the chest/abdomen/pelvis which demonstrated some improvement in the disease, but also several sites of progression. Symptomatically, patient is doing about the same as at the last visit to the clinic. He underwent assessment with an echocardiogram demonstrating preserved left ventricular ejection fraction as well as an total Infuse-a-Port. Previous lab work demonstrated positive  testing for hepatitis B antibody, with additional testing showing negative IgM and negative evidence of presence of the viral DNA. Patient reports no previous history of hepatitis B immunization, he does not remember having active hepatitis infection the past either.  At the present time, he denies any new respiratory, gastrointestinal, neurological, or musculoskeletal symptoms.  Oncological/hematological History:   Diffuse large B cell lymphoma ([place])   06/28/2017 Imaging    CT Renal protocol: Bilateral nephrolithiasis. 6.7 cm soft tissue mass in the left. Aortic location, additional 3.6 cm left. Aortic soft tissue mass. Mild prostatic enlargement. 5.6 cm soft tissue mass between the anterior right sixth and seventh ribs. Patchy lucencies in the T10, soft tissue mass in the right posterior elements of T12 measuring 3.9 cm with associated osseous destruction. There is mass extension into the spinal canal. Small lucent lesion in the posterior L1. Patchy lucency throughout L3 with partial collapse of the vertebral body. Soft tissue mass extending into the canal. Bilateral pubic symphysis lesions.       06/28/2017 Imaging    MRI T/L Spine: Multiple osseous lesions involving T2, T6, T7 with the near complete involvement of the vertebral body, T8, T10 with almost complete replacement, T12. Thoracic spinal cord with normal signal. Large L3 vertebral body lesion with burst fracture with 50% height loss, small lesion in L4, left prevertebral mass at the L1 level measuring 3.9 cm, large periaortic mass at the level of left renal vessels measuring 9.4 cm.       06/29/2017 Initial Diagnosis    Diffuse large B cell lymphoma ([place])      06/29/2017 Pathology Results    Core Bx Rt chest wall: Diffuse large B-cell lymphoma, germinal center subtype;  IHC -- positive for CD10, CD20, BCL-6, BCL-2, Ki-67 86%      07/02/2017 - 07/13/2017 Radiation Therapy    Site/dose:   Lumbar spine // 25 Gy in 10 Fx      07/30/2017  Imaging    CT C/A/P: Interval improvement in some, but worsening of other skeletal lesions. No enlarged mediastinal lymph nodes. Normal appearing esophagus and thyroid gland.  No lung nodules or pleural fluid. Left anterior chest wall mass arising from the anterior aspect of the left third rib, 4.9x3.4cm. Right anterior chest wall mass arising from the anterior aspect of the sixth rib, 5.9x3.0cm, previously 5.6 x 3.9 cm on 06/28/2017. Lytic and expansile lesion in the lateral aspect of the left third rib. Similar changes involving the posterior aspect of the left seventh rib.Expansile lesion involving the posteromedial aspect of the left scapula. Lytic lesion in the right T10 vertebral body and posterior elements demonstrates some interval calcification without significant change in extent and with interval superior endplate compression. Lytic, expansile lesion involving the right vertebral body and posterior elements at the T12 level is smaller without well-visualized tumor in the canal on the right, improved. There is interval mild collapse of the posterior aspect of the vertebral body on the right. Normal appearing liver. Normal spleen in size without focal abnormality. The previously demonstrated 6.7 x 5.1 cm left para-aortic retroperitoneal lymph node mass is much smaller, currently measuring 3.1 x 1.7 cm on image number 81 series 2. A more inferior left para-aortic node previously measuring 3.6 x 3.3 cm currently measures 1.8 x 1.2 cm on image number 89 series 2. No new or enlarging lymph nodes are seen. The previously demonstrated left L3 vertebral destructive mass is significantly smaller with decreased size of the previously demonstrated component within the spinal canal. There is also mildly progressive collapse and sclerosis of the L3 vertebral body. There has been an increase in size of a similar-appearing mass in anterior aspect of the right iliac bone, currently measuring 4.0 x 3.4 cm. There is  also been increase in size of a similar-appearing mass in the right pubic body, currently measuring approximately 3.1 x 2.8 cm, extending into the symphysis pubis.      08/09/2017 -  Chemotherapy    R-CHOP21: DOXOrubicin (ADRIAMYCIN) 50 mg/m2, d1 + vinCRIStine (ONCOVIN) 2 mg, d1 + riTUXimab (RITUXAN) 375 mg/m2, d1 + cyclophosphamide (CYTOXAN) 750 mg/m2, d1 Q21d x6 cycles planned --Cycle #1, 08/09/17:       Medical History: Past Medical History:  Diagnosis Date  . Cancer ([place])   . Tendonitis     Surgical History: Past Surgical History:  Procedure Laterality Date  . HERNIA REPAIR  1988   Dr. Vida Rigger  . IR FLUORO GUIDE PORT INSERTION RIGHT  07/26/2017  . IR US GUIDE VASC ACCESS RIGHT  07/26/2017    Family History: Family History  Problem Relation Age of Onset  . Cancer Father     Social History: Social History   Social History  . Marital status: Single    Spouse name: N/A  . Number of children: 0  . Years of education: N/A   Occupational History  . Not on file.   Social History Main Topics  . Smoking status: Never Smoker  . Smokeless tobacco: Never Used  . Alcohol use Yes     Comment: occasionally/socially  . Drug use: No  . Sexual activity: No   Other Topics Concern  . 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 Never used smokeless tobacco.   No e-cigarettes   Occasional alcohol consumption socially.   Single, no children.    Allergies: Allergies  Allergen Reactions  . Bee Venom Swelling    Medications:  Current Outpatient Prescriptions  Medication Sig Dispense Refill  . acyclovir (ZOVIRAX) 200 MG capsule Take 1 capsule (200 mg total) by mouth 2 (two) times daily. 60 capsule 0  . allopurinol (ZYLOPRIM) 300 MG tablet Take 1 tablet (300 mg total) by mouth daily. 30 tablet 0  . lidocaine-prilocaine (EMLA) cream Apply to affected area once 30 g 3  . LORazepam (ATIVAN) 0.5 MG tablet  Take 1 tablet (0.5 mg total) by mouth every 6 (six) hours as needed (Nausea or vomiting). 30 tablet 0  . ondansetron (ZOFRAN) 8 MG tablet Take 1 tablet (8 mg total) by mouth 2 (two) times daily as needed for refractory nausea / vomiting. Start on day 3 after cyclophosphamide chemotherapy. 30 tablet 1  . oxyCODONE (OXY IR/ROXICODONE) 5 MG immediate release tablet Take 1 tablet (5 mg total) by mouth every 4 (four) hours as needed for moderate pain or severe pain. 50 tablet 0  . pantoprazole (PROTONIX) 40 MG tablet Take 1 tablet (40 mg total) by mouth daily at 12 noon. 30 tablet 0  . polyethylene glycol (MIRALAX / GLYCOLAX) packet Take 17 g by mouth daily as needed for mild constipation. 14 each 0  . predniSONE (DELTASONE) 20 MG tablet Take 3 tablets (60 mg total) by mouth daily with breakfast. 15 tablet 0  . prochlorperazine (COMPAZINE) 10 MG tablet Take 10 mg by mouth every 8 (eight) hours as needed for nausea or vomiting. Dispense 40 tablets, no reill     No current facility-administered medications for this visit.    Facility-Administered Medications Ordered in Other Visits  Medication Dose Route Frequency Provider Last Rate Last Dose  . sodium chloride flush (NS) 0.9 % injection 10 mL  10 mL Intracatheter PRN Ardath Sax, MD   10 mL at 08/09/17 1702    Review of Systems: Review of Systems  Constitutional: Positive for appetite change and fatigue. Negative for chills, diaphoresis, fever and unexpected weight change.  HENT:  Negative.   Eyes: Negative.   Respiratory: Negative.   Cardiovascular: Negative.   Gastrointestinal: Negative.   Endocrine: Negative.   Genitourinary: Negative.    Musculoskeletal: Positive for back pain. Negative for arthralgias, flank pain, myalgias, neck pain and neck stiffness.  Skin: Negative.   Neurological: Negative.   Hematological: Negative.   Psychiatric/Behavioral: Negative.      PHYSICAL EXAMINATION Blood pressure (!) 142/66, pulse 88,  temperature 98.3 F (36.8 C), temperature source Oral, resp. rate 18, height 6' 2"  (1.88 m), weight 173 lb 12.8 oz (78.8 kg), SpO2 100 %.  ECOG PERFORMANCE STATUS: 2 - Symptomatic, <50% confined to bed  Physical Exam  Constitutional: He is oriented to person, place, and time. No distress.  HENT:  Head: Normocephalic and atraumatic.  Mouth/Throat: Oropharynx is clear and moist. No oropharyngeal exudate.  Eyes: Pupils are equal, round, and reactive to light. Right eye exhibits no discharge. No scleral icterus.  Neck: Normal range of motion. No thyromegaly present.  Cardiovascular: Normal rate and regular rhythm.  Exam reveals no gallop.   No murmur heard. Pulmonary/Chest: Effort normal. No respiratory distress. He has no wheezes. He exhibits no tenderness.  Abdominal: Soft. He exhibits no distension and no mass. There is no tenderness.  There is no rebound and no guarding.  Musculoskeletal: He exhibits no edema, tenderness or deformity.  Lymphadenopathy:    He has no cervical adenopathy.  Neurological: He is alert and oriented to person, place, and time. He has normal reflexes.  Skin: Skin is warm and dry. No rash noted. He is not diaphoretic. No erythema. No pallor.     LABORATORY DATA: I have personally reviewed the data as listed: Appointment on 08/08/2017  Component Date Value Ref Range Status  . WBC 08/08/2017 4.9  4.0 - 10.3 10e3/uL Final  . NEUT# 08/08/2017 3.6  1.5 - 6.5 10e3/uL Final  . HGB 08/08/2017 12.2* 13.0 - 17.1 g/dL Final  . HCT 08/08/2017 36.6* 38.4 - 49.9 % Final  . Platelets 08/08/2017 255  140 - 400 10e3/uL Final  . MCV 08/08/2017 76.3* 79.3 - 98.0 fL Final  . MCH 08/08/2017 25.4* 27.2 - 33.4 pg Final  . MCHC 08/08/2017 33.3  32.0 - 36.0 g/dL Final  . RBC 08/08/2017 4.80  4.20 - 5.82 10e6/uL Final  . RDW 08/08/2017 16.3* 11.0 - 14.6 % Final  . lymph# 08/08/2017 1.0  0.9 - 3.3 10e3/uL Final  . MONO# 08/08/2017 0.3  0.1 - 0.9 10e3/uL Final  . Eosinophils  Absolute 08/08/2017 0.0  0.0 - 0.5 10e3/uL Final  . Basophils Absolute 08/08/2017 0.0  0.0 - 0.1 10e3/uL Final  . NEUT% 08/08/2017 73.0  39.0 - 75.0 % Final  . LYMPH% 08/08/2017 20.6  14.0 - 49.0 % Final  . MONO% 08/08/2017 6.0  0.0 - 14.0 % Final  . EOS% 08/08/2017 0.4  0.0 - 7.0 % Final  . BASO% 08/08/2017 0.0  0.0 - 2.0 % Final  . Sodium 08/08/2017 137  136 - 145 mEq/L Final  . Potassium 08/08/2017 3.5  3.5 - 5.1 mEq/L Final  . Chloride 08/08/2017 100  98 - 109 mEq/L Final  . CO2 08/08/2017 27  22 - 29 mEq/L Final  . Glucose 08/08/2017 101  70 - 140 mg/dl Final   Glucose reference range is for nonfasting patients. Fasting glucose reference range is 70- 100.  Marland Kitchen BUN 08/08/2017 12.3  7.0 - 26.0 mg/dL Final  . Creatinine 08/08/2017 0.8  0.7 - 1.3 mg/dL Final  . Total Bilirubin 08/08/2017 0.85  0.20 - 1.20 mg/dL Final  . Alkaline Phosphatase 08/08/2017 106  40 - 150 U/L Final  . AST 08/08/2017 22  5 - 34 U/L Final  . ALT 08/08/2017 22  0 - 55 U/L Final  . Total Protein 08/08/2017 6.9  6.4 - 8.3 g/dL Final  . Albumin 08/08/2017 3.4* 3.5 - 5.0 g/dL Final  . Calcium 08/08/2017 10.6* 8.4 - 10.4 mg/dL Final  . Anion Gap 08/08/2017 10  3 - 11 mEq/L Final  . EGFR 08/08/2017 >90  >90 ml/min/1.73 m2 Final   eGFR is calculated using the CKD-EPI Creatinine Equation (2009)  . Uric Acid, Serum 08/08/2017 3.7  2.6 - 7.4 mg/dl Final  . Magnesium 08/08/2017 2.1  1.5 - 2.5 mg/dl Final  . Phosphorus, Ser 08/08/2017 2.8  2.5 - 4.5 mg/dL Final  . Hep B E Ag 08/08/2017 Negative  Negative Final  . HBV DNA SERPL PCR-ACNC 08/08/2017 HBV DNA not detected  IU/mL Final  . HBV DNA SERPL PCR-LOG IU 08/08/2017 Test Not Performed.  log10 IU/mL Final   Comment: Unable to calculate result since non-numeric result obtained for component test.   . Test Information: 08/08/2017 Comment   Final   The reportable range  for this assay is 10 IU/mL to 1 billion IU/mL.  Marland Kitchen Hep B Core Ab, IgM 08/08/2017 Negative  Negative Final   . LDH 08/08/2017 309* 125 - 245 U/L Final  . Technologist Review 08/08/2017 Rare meta   Final       Ardath Sax, MD

## 2017-08-10 NOTE — Progress Notes (Signed)
The patient is approved for drug assistance by [place] for Rituxan. Coverage is from 08/10/17 - 08/10/18. I will submit a replacement request for the first DOS 08/09/17.

## 2017-08-13 ENCOUNTER — Other Ambulatory Visit: Payer: Self-pay | Admitting: Radiation Oncology

## 2017-08-13 ENCOUNTER — Telehealth: Payer: Self-pay | Admitting: *Deleted

## 2017-08-13 DIAGNOSIS — C8338 Diffuse large B-cell lymphoma, lymph nodes of multiple sites: Secondary | ICD-10-CM

## 2017-08-13 DIAGNOSIS — C7951 Secondary malignant neoplasm of bone: Secondary | ICD-10-CM

## 2017-08-13 MED ORDER — OXYCODONE HCL 5 MG PO TABS: 5.0000 mg | ORAL_TABLET | ORAL | 0 refills | Status: DC | PRN

## 2017-08-13 MED ORDER — PANTOPRAZOLE SODIUM 40 MG PO TBEC: 40.0000 mg | DELAYED_RELEASE_TABLET | Freq: Every day | ORAL | 0 refills | Status: DC

## 2017-08-13 NOTE — Telephone Encounter (Addendum)
No answer  Let phone ring 25x  Twice, tried to let patient know that he would ned to come in for his rx Oxycodone, can e-scribe the protonix for him later today, will try again later, as I was  Out of the offic last week 8:58 AM

## 2017-08-14 NOTE — Addendum Note (Signed)
Addended by: Ardath Sax on: 08/14/2017 12:26 PM   Modules accepted: Orders

## 2017-08-16 ENCOUNTER — Ambulatory Visit (HOSPITAL_BASED_OUTPATIENT_CLINIC_OR_DEPARTMENT_OTHER): Payer: Medicaid Other | Admitting: Hematology and Oncology

## 2017-08-16 ENCOUNTER — Other Ambulatory Visit (HOSPITAL_BASED_OUTPATIENT_CLINIC_OR_DEPARTMENT_OTHER): Payer: Medicaid Other

## 2017-08-16 ENCOUNTER — Encounter: Payer: Self-pay | Admitting: Hematology and Oncology

## 2017-08-16 ENCOUNTER — Ambulatory Visit (HOSPITAL_BASED_OUTPATIENT_CLINIC_OR_DEPARTMENT_OTHER): Payer: Medicaid Other

## 2017-08-16 VITALS — BP 103/47 | HR 87 | Temp 98.3°F | Resp 19 | Ht 74.0 in | Wt 178.3 lb

## 2017-08-16 DIAGNOSIS — C8338 Diffuse large B-cell lymphoma, lymph nodes of multiple sites: Secondary | ICD-10-CM

## 2017-08-16 DIAGNOSIS — C7951 Secondary malignant neoplasm of bone: Secondary | ICD-10-CM

## 2017-08-16 LAB — CBC WITH DIFFERENTIAL/PLATELET
BASO%: 0.2 % (ref 0.0–2.0)
BASOS ABS: 0 10*3/uL (ref 0.0–0.1)
EOS ABS: 0 10*3/uL (ref 0.0–0.5)
EOS%: 1.6 % (ref 0.0–7.0)
HEMATOCRIT: 32.3 % — AB (ref 38.4–49.9)
HEMOGLOBIN: 11 g/dL — AB (ref 13.0–17.1)
LYMPH%: 3.8 % — ABNORMAL LOW (ref 14.0–49.0)
MCH: 25.1 pg — AB (ref 27.2–33.4)
MCHC: 34 g/dL (ref 32.0–36.0)
MCV: 73.9 fL — AB (ref 79.3–98.0)
MONO#: 0 10*3/uL — AB (ref 0.1–0.9)
MONO%: 0.5 % (ref 0.0–14.0)
NEUT%: 93.9 % — ABNORMAL HIGH (ref 39.0–75.0)
NEUTROS ABS: 2.6 10*3/uL (ref 1.5–6.5)
PLATELETS: 158 10*3/uL (ref 140–400)
RBC: 4.37 10*6/uL (ref 4.20–5.82)
RDW: 17.7 % — ABNORMAL HIGH (ref 11.0–14.6)
WBC: 2.7 10*3/uL — ABNORMAL LOW (ref 4.0–10.3)
lymph#: 0.1 10*3/uL — ABNORMAL LOW (ref 0.9–3.3)

## 2017-08-16 LAB — COMPREHENSIVE METABOLIC PANEL
ALBUMIN: 3.4 g/dL — AB (ref 3.5–5.0)
ALT: 59 U/L — ABNORMAL HIGH (ref 0–55)
AST: 27 U/L (ref 5–34)
Alkaline Phosphatase: 110 U/L (ref 40–150)
Anion Gap: 8 mEq/L (ref 3–11)
BUN: 10.4 mg/dL (ref 7.0–26.0)
CHLORIDE: 101 meq/L (ref 98–109)
CO2: 25 meq/L (ref 22–29)
Calcium: 8.1 mg/dL — ABNORMAL LOW (ref 8.4–10.4)
Creatinine: 0.6 mg/dL — ABNORMAL LOW (ref 0.7–1.3)
GLUCOSE: 83 mg/dL (ref 70–140)
POTASSIUM: 3.6 meq/L (ref 3.5–5.1)
SODIUM: 135 meq/L — AB (ref 136–145)
Total Bilirubin: 0.89 mg/dL (ref 0.20–1.20)
Total Protein: 6.4 g/dL (ref 6.4–8.3)

## 2017-08-16 LAB — URIC ACID: Uric Acid, Serum: <2 mg/dl — ABNORMAL LOW (ref 2.6–7.4)

## 2017-08-16 LAB — MAGNESIUM: Magnesium: 2.3 mg/dl (ref 1.5–2.5)

## 2017-08-16 MED ORDER — SODIUM CHLORIDE 0.9 % IV SOLN
Freq: Once | INTRAVENOUS | Status: AC
  Administered 2017-08-16: 12:00:00 via INTRAVENOUS

## 2017-08-16 MED ORDER — SODIUM CHLORIDE 0.9% FLUSH
10.0000 mL | INTRAVENOUS | Status: AC | PRN
Start: 2017-08-16 — End: 2017-08-16
  Administered 2017-08-16: 10 mL
  Filled 2017-08-16: qty 10

## 2017-08-16 MED ORDER — HEPARIN SOD (PORK) LOCK FLUSH 100 UNIT/ML IV SOLN
500.0000 [IU] | INTRAVENOUS | Status: AC | PRN
  Administered 2017-08-16: 500 [IU]
  Filled 2017-08-16: qty 5

## 2017-08-17 LAB — PHOSPHORUS: PHOSPHORUS: 1.2 mg/dL — AB (ref 2.5–4.5)

## 2017-08-20 ENCOUNTER — Telehealth: Payer: Self-pay | Admitting: *Deleted

## 2017-08-20 ENCOUNTER — Other Ambulatory Visit: Payer: Self-pay | Admitting: Hematology and Oncology

## 2017-08-20 ENCOUNTER — Other Ambulatory Visit: Payer: Self-pay | Admitting: *Deleted

## 2017-08-20 DIAGNOSIS — C8338 Diffuse large B-cell lymphoma, lymph nodes of multiple sites: Secondary | ICD-10-CM

## 2017-08-20 MED ORDER — SODIUM CHLORIDE 0.9 % IJ SOLN: 12.0000 mg | Freq: Once | INTRAMUSCULAR | Status: DC

## 2017-08-20 MED ORDER — SODIUM CHLORIDE 0.9 % IJ SOLN: 12.0000 mg | INTRAMUSCULAR | Status: DC

## 2017-08-20 NOTE — Progress Notes (Signed)
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 Initial presentation with severe left hip and knee due to extensive disease involving the skeletal structures of the spine with multilevel compression fractures. Some spinal canal intrusion without obvious cord compression was noted. Additionally, bulky retroperitoneal lymphadenopathy and a soft tissue mass in the thoracic wall were detected. Based on presence of extensive skeletal disease and soft tissue mass in the chest wall, patient is Stage IVB.  Patient was initially treated with steroids and palliative radiation to the spine which resulted in significant symptomatic improvement. Patient has recovered from his radiation therapy. Pretreatment workup included echocardiogram obtained on 07/19/17 demonstrating left ventricular ejection fraction of 50-55%, patient underwent lab work testing which returned back positive for core antibodies for hepatitis B virus. Additional testing was negative for IgM antibodies and negative for hepatitis B DNA. These findings are consistent with either previous infection for which the patient has completely recovered, or previous history of immunization patient does not recall having either.   With the findings of hepatitis B antibodies, I have conducted additional conversation with the patient regarding the potential additional risk of reactivation of the viral infection due to rituximab function. Risks discussed included fulminant hepatitis with hepatic failure. Patient still agrees to proceed with the planned treatment as rituximab at significant therapeutic benefit which I think outweighs the risk at this point in time.  Patient has received initial cycle of curative fashion times chemotherapy with R-CHOP last week and presents to  the clinic for toxicity monitoring. Appears to have tolerated chemotherapy well without any noticeable side effects so far. This cycle was administered without intrathecal prophylaxis due to high volume of the disease with expectations of tumor lysis syndrome.   It is likely that I will recommend adding intrathecal methotrexate prophylaxis with the subsequent cycles of therapy based on the advanced stage disease including extra lymphatic disease and skeletal involvement. Patient will start receiving methotrexate 12 mg with each cycle of [name] starting with the second.  Plan: --Continue Allopurinol 326m PO QDay --Continue Acyclovir  --Decrease dexamethasone to 411mevery other day --On RTC: Labs, clinic visit, possible Cycle #2/6 of R-CHOP with intrathecal methotrexate for CNS relapse prophylaxis --Denosumab therapy Q6wks due to extensive skeletal involvement   Hypophosphatemia Low phos value noted after the clinic visit. Etiology unclear, but may be related to effects of denosumab in increasing the phos absorption into the bone.  Plan: --Re-check labs on Monday & replace PO/IV if still low  Voice recognition software was used and creation of this note. Despite my best effort at editing the text, some misspelling/errors may have occurred.  Orders Placed This Encounter  Procedures  . CBC with Differential    Standing Status:   Future    Standing Expiration Date:   08/16/2018  . Comprehensive metabolic panel    Standing Status:   Future    Standing Expiration Date:   08/16/2018  . Lactate dehydrogenase (LDH)    Standing Status:   Future    Standing Expiration Date:   08/16/2018  . Uric acid    Standing Status:   Future    Standing Expiration Date:   08/16/2018  . Magnesium    Standing Status:   Future    Standing Expiration Date:   08/16/2018  . Phosphorus    Standing Status:   Future  Standing Expiration Date:   08/16/2018    Cancer Staging Diffuse large B cell lymphoma  ([place]) Staging form: Hodgkin and Non-Hodgkin Lymphoma, AJCC 8th Edition - Clinical stage from 07/16/2017: Stage IV (Diffuse large B-cell lymphoma) - Signed by Ardath Sax, MD on 07/18/2017   All questions were answered.  . The patient knows to call the clinic with any problems, questions or concerns.  This note was electronically signed.    History of Presenting Illness Cory Ibarra is a 56 y.o. Caucasian male followed in the [facility] for diagnosis of diffuse large B-cell lymphoma. Patient initially presented on 06/28/17 with progressive pain in the left hip and knee at least 6 week duration. This was accompanied by decreased appetite, decreased activity due to the pain. Patient was not closely followed by physician for an extended period of time due to lack of insurance.  Initially presented the week prior and was diagnosed with sciatica and treated with steroids. Steroids resulted in symptomatic improvement, but the pain recurred rapidly after completion of a taper. Approximately at the time of onset of the pain in the left lower extremity, patient will start noticing mild abdominal discomfort. This has been associated with loss of appetite for a month and some weight loss. You, dysphasia, early satiety, diarrhea, constipation, melena, or hematochezia. Initial imaging evaluation revealed multifocal lytic lesions in the skeletal structures as well as pelvic mass. Oncology was consulted and patient was seen by Dr Marjie Skiff. Based on his recommendations, additional imaging was obtained, patient was started on steroids, neurosurgery and radiation oncology were consulted. Patient was started on palliative radiation therapy to the lumbar spine which resulted in significant symptomatic improvement.  At the last visit to the clinic, patient was started on systemic chemotherapy with R-CHOP combination, he also received a dose of Denosumab. Since initiation of therapy, patient denies any new symptoms.  Pain control is adequate, patient continues to take dexamethasone 4 mg daily. At the present time, he denies any new respiratory, gastrointestinal, neurological, or musculoskeletal symptoms.  Oncological/hematological History:   Diffuse large B cell lymphoma ([place])   06/28/2017 Imaging    CT Renal protocol: Bilateral nephrolithiasis. 6.7 cm soft tissue mass in the left. Aortic location, additional 3.6 cm left. Aortic soft tissue mass. Mild prostatic enlargement. 5.6 cm soft tissue mass between the anterior right sixth and seventh ribs. Patchy lucencies in the T10, soft tissue mass in the right posterior elements of T12 measuring 3.9 cm with associated osseous destruction. There is mass extension into the spinal canal. Small lucent lesion in the posterior L1. Patchy lucency throughout L3 with partial collapse of the vertebral body. Soft tissue mass extending into the canal. Bilateral pubic symphysis lesions.       06/28/2017 Imaging    MRI T/L Spine: Multiple osseous lesions involving T2, T6, T7 with the near complete involvement of the vertebral body, T8, T10 with almost complete replacement, T12. Thoracic spinal cord with normal signal. Large L3 vertebral body lesion with burst fracture with 50% height loss, small lesion in L4, left prevertebral mass at the L1 level measuring 3.9 cm, large periaortic mass at the level of left renal vessels measuring 9.4 cm.       06/29/2017 Initial Diagnosis    Diffuse large B cell lymphoma ([place])      06/29/2017 Pathology Results    Core Bx Rt chest wall: Diffuse large B-cell lymphoma, germinal center subtype; IHC -- positive for CD10, CD20, BCL-6, BCL-2, Ki-67 86%  07/02/2017 - 07/13/2017 Radiation Therapy    Site/dose:   Lumbar spine // 25 Gy in 10 Fx      07/19/2017 Echocardiogram    LVEF 50-55%      07/30/2017 Imaging    CT C/A/P: Interval improvement in some, but worsening of other skeletal lesions. No enlarged mediastinal lymph nodes. Normal  appearing esophagus and thyroid gland.  No lung nodules or pleural fluid. Left anterior chest wall mass arising from the anterior aspect of the left third rib, 4.9x3.4cm. Right anterior chest wall mass arising from the anterior aspect of the sixth rib, 5.9x3.0cm, previously 5.6 x 3.9 cm on 06/28/2017. Lytic and expansile lesion in the lateral aspect of the left third rib. Similar changes involving the posterior aspect of the left seventh rib.Expansile lesion involving the posteromedial aspect of the left scapula. Lytic lesion in the right T10 vertebral body and posterior elements demonstrates some interval calcification without significant change in extent and with interval superior endplate compression. Lytic, expansile lesion involving the right vertebral body and posterior elements at the T12 level is smaller without well-visualized tumor in the canal on the right, improved. There is interval mild collapse of the posterior aspect of the vertebral body on the right. Normal appearing liver. Normal spleen in size without focal abnormality. The previously demonstrated 6.7 x 5.1 cm left para-aortic retroperitoneal lymph node mass is much smaller, currently measuring 3.1 x 1.7 cm on image number 81 series 2. A more inferior left para-aortic node previously measuring 3.6 x 3.3 cm currently measures 1.8 x 1.2 cm on image number 89 series 2. No new or enlarging lymph nodes are seen. The previously demonstrated left L3 vertebral destructive mass is significantly smaller with decreased size of the previously demonstrated component within the spinal canal. There is also mildly progressive collapse and sclerosis of the L3 vertebral body. There has been an increase in size of a similar-appearing mass in anterior aspect of the right iliac bone, currently measuring 4.0 x 3.4 cm. There is also been increase in size of a similar-appearing mass in the right pubic body, currently measuring approximately 3.1 x 2.8 cm, extending  into the symphysis pubis.      08/09/2017 -  Chemotherapy    R-CHOP21: DOXOrubicin (ADRIAMYCIN) 50 mg/m2, d1 + vinCRIStine (ONCOVIN) 2 mg, d1 + riTUXimab (RITUXAN) 375 mg/m2, d1 + cyclophosphamide (CYTOXAN) 750 mg/m2, d1 Q21d x6 cycles planned --Cycle #1, 08/09/17:       Medical History: Past Medical History:  Diagnosis Date  . Cancer ([place])   . Tendonitis     Surgical History: Past Surgical History:  Procedure Laterality Date  . HERNIA REPAIR  1988   Dr. Vida Rigger  . IR FLUORO GUIDE PORT INSERTION RIGHT  07/26/2017  . IR US GUIDE VASC ACCESS RIGHT  07/26/2017    Family History: Family History  Problem Relation Age of Onset  . Cancer Father     Social History: Social History   Social History  . Marital status: Single    Spouse name: N/A  . Number of children: 0  . Years of education: N/A   Occupational History  . Not on file.   Social History Main Topics  . Smoking status: Never Smoker  . Smokeless tobacco: Never Used  . Alcohol use Yes     Comment: occasionally/socially  . Drug use: No  . Sexual activity: No   Other Topics Concern  . 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 Never used smokeless tobacco.   No e-cigarettes   Occasional alcohol consumption socially.   Single, no children.    Allergies: Allergies  Allergen Reactions  . Bee Venom Swelling    Medications:  Current Outpatient Prescriptions  Medication Sig Dispense Refill  . dexamethasone (DECADRON) 4 MG tablet Take 4 mg by mouth every other day.     . allopurinol (ZYLOPRIM) 300 MG tablet Take 1 tablet (300 mg total) by mouth daily. 30 tablet 0  . lidocaine-prilocaine (EMLA) cream Apply to affected area once 30 g 3  . LORazepam (ATIVAN) 0.5 MG tablet Take 1 tablet (0.5 mg total) by mouth every 6 (six) hours as needed (Nausea or vomiting). 30 tablet 0  . ondansetron (ZOFRAN) 8 MG tablet Take 1 tablet (8 mg total) by  mouth 2 (two) times daily as needed for refractory nausea / vomiting. Start on day 3 after cyclophosphamide chemotherapy. 30 tablet 1  . oxyCODONE (OXY IR/ROXICODONE) 5 MG immediate release tablet Take 1 tablet (5 mg total) by mouth every 4 (four) hours as needed for moderate pain or severe pain. 50 tablet 0  . pantoprazole (PROTONIX) 40 MG tablet Take 1 tablet (40 mg total) by mouth daily at 12 noon. 30 tablet 0  . polyethylene glycol (MIRALAX / GLYCOLAX) packet Take 17 g by mouth daily as needed for mild constipation. 14 each 0  . prochlorperazine (COMPAZINE) 10 MG tablet Take 10 mg by mouth every 8 (eight) hours as needed for nausea or vomiting. Dispense 40 tablets, no reill     No current facility-administered medications for this visit.    Facility-Administered Medications Ordered in Other Visits  Medication Dose Route Frequency Provider Last Rate Last Dose  . sodium chloride flush (NS) 0.9 % injection 10 mL  10 mL Intracatheter PRN Ardath Sax, MD   10 mL at 08/09/17 1702    Review of Systems: Review of Systems  Constitutional: Positive for appetite change and fatigue. Negative for chills, diaphoresis, fever and unexpected weight change.  HENT:  Negative.   Eyes: Negative.   Respiratory: Negative.   Cardiovascular: Negative.   Gastrointestinal: Negative.   Endocrine: Negative.   Genitourinary: Negative.    Musculoskeletal: Positive for back pain. Negative for arthralgias, flank pain, myalgias, neck pain and neck stiffness.  Skin: Negative.   Neurological: Negative.   Hematological: Negative.   Psychiatric/Behavioral: Negative.      PHYSICAL EXAMINATION Blood pressure (!) 103/47, pulse 87, temperature 98.3 F (36.8 C), temperature source Oral, resp. rate 19, height 6' 2"  (1.88 m), weight 178 lb 4.8 oz (80.9 kg), SpO2 100 %.  ECOG PERFORMANCE STATUS: 2 - Symptomatic, <50% confined to bed  Physical Exam  Constitutional: He is oriented to person, place, and time. No  distress.  HENT:  Head: Normocephalic and atraumatic.  Mouth/Throat: Oropharynx is clear and moist. No oropharyngeal exudate.  Eyes: Pupils are equal, round, and reactive to light. Right eye exhibits no discharge. No scleral icterus.  Neck: Normal range of motion. No thyromegaly present.  Cardiovascular: Normal rate and regular rhythm.  Exam reveals no gallop.   No murmur heard. Pulmonary/Chest: Effort normal. No respiratory distress. He has no wheezes. He exhibits no tenderness.  Abdominal: Soft. He exhibits no distension and no mass. There is no tenderness. There is no rebound and no guarding.  Musculoskeletal: He exhibits no edema, tenderness or deformity.  Lymphadenopathy:    He has no cervical adenopathy.  Neurological:  He is alert and oriented to person, place, and time. He has normal reflexes.  Skin: Skin is warm and dry. No rash noted. He is not diaphoretic. No erythema. No pallor.     LABORATORY DATA: I have personally reviewed the data as listed: Appointment on 08/16/2017  Component Date Value Ref Range Status  . WBC 08/16/2017 2.7* 4.0 - 10.3 10e3/uL Final  . NEUT# 08/16/2017 2.6  1.5 - 6.5 10e3/uL Final  . HGB 08/16/2017 11.0* 13.0 - 17.1 g/dL Final  . HCT 08/16/2017 32.3* 38.4 - 49.9 % Final  . Platelets 08/16/2017 158  140 - 400 10e3/uL Final  . MCV 08/16/2017 73.9* 79.3 - 98.0 fL Final  . MCH 08/16/2017 25.1* 27.2 - 33.4 pg Final  . MCHC 08/16/2017 34.0  32.0 - 36.0 g/dL Final  . RBC 08/16/2017 4.37  4.20 - 5.82 10e6/uL Final  . RDW 08/16/2017 17.7* 11.0 - 14.6 % Final  . lymph# 08/16/2017 0.1* 0.9 - 3.3 10e3/uL Final  . MONO# 08/16/2017 0.0* 0.1 - 0.9 10e3/uL Final  . Eosinophils Absolute 08/16/2017 0.0  0.0 - 0.5 10e3/uL Final  . Basophils Absolute 08/16/2017 0.0  0.0 - 0.1 10e3/uL Final  . NEUT% 08/16/2017 93.9* 39.0 - 75.0 % Final  . LYMPH% 08/16/2017 3.8* 14.0 - 49.0 % Final  . MONO% 08/16/2017 0.5  0.0 - 14.0 % Final  . EOS% 08/16/2017 1.6  0.0 - 7.0 % Final   . BASO% 08/16/2017 0.2  0.0 - 2.0 % Final  . Sodium 08/16/2017 135* 136 - 145 mEq/L Final  . Potassium 08/16/2017 3.6  3.5 - 5.1 mEq/L Final  . Chloride 08/16/2017 101  98 - 109 mEq/L Final  . CO2 08/16/2017 25  22 - 29 mEq/L Final  . Glucose 08/16/2017 83  70 - 140 mg/dl Final   Glucose reference range is for nonfasting patients. Fasting glucose reference range is 70- 100.  Marland Kitchen BUN 08/16/2017 10.4  7.0 - 26.0 mg/dL Final  . Creatinine 08/16/2017 0.6* 0.7 - 1.3 mg/dL Final  . Total Bilirubin 08/16/2017 0.89  0.20 - 1.20 mg/dL Final  . Alkaline Phosphatase 08/16/2017 110  40 - 150 U/L Final  . AST 08/16/2017 27  5 - 34 U/L Final  . ALT 08/16/2017 59* 0 - 55 U/L Final  . Total Protein 08/16/2017 6.4  6.4 - 8.3 g/dL Final  . Albumin 08/16/2017 3.4* 3.5 - 5.0 g/dL Final  . Calcium 08/16/2017 8.1* 8.4 - 10.4 mg/dL Final  . Anion Gap 08/16/2017 8  3 - 11 mEq/L Final  . EGFR 08/16/2017 >90  >90 ml/min/1.73 m2 Final   eGFR is calculated using the CKD-EPI Creatinine Equation (2009)  . Uric Acid, Serum 08/16/2017 <2.0* 2.6 - 7.4 mg/dl Final  . Phosphorus, Ser 08/16/2017 1.2* 2.5 - 4.5 mg/dL Final   **Verified by repeat analysis**  . Magnesium 08/16/2017 2.3  1.5 - 2.5 mg/dl Final       Ardath Sax, MD

## 2017-08-20 NOTE — Telephone Encounter (Signed)
SW Peggy in IR, pt scheduled for IT mtx on 9/28 @0830 .  LVM with patient with call back number regarding apts.  Next IT scheduled for 10/17.  Left message with Dr. [name] to enter separate order for each IT.

## 2017-08-20 NOTE — Assessment & Plan Note (Signed)
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 Initial presentation with severe left hip and knee due to extensive disease involving the skeletal structures of the spine with multilevel compression fractures. Some spinal canal intrusion without obvious cord compression was noted. Additionally, bulky retroperitoneal lymphadenopathy and a soft tissue mass in the thoracic wall were detected. Based on presence of extensive skeletal disease and soft tissue mass in the chest wall, patient is Stage IVB.  Patient was initially treated with steroids and palliative radiation to the spine which resulted in significant symptomatic improvement. Patient has recovered from his radiation therapy. Pretreatment workup included echocardiogram obtained on 07/19/17 demonstrating left ventricular ejection fraction of 50-55%, patient underwent lab work testing which returned back positive for core antibodies for hepatitis B virus. Additional testing was negative for IgM antibodies and negative for hepatitis B DNA. These findings are consistent with either previous infection for which the patient has completely recovered, or previous history of immunization patient does not recall having either.   With the findings of hepatitis B antibodies, I have conducted additional conversation with the patient regarding the potential additional risk of reactivation of the viral infection due to rituximab function. Risks discussed included fulminant hepatitis with hepatic failure. Patient still agrees to proceed with the planned treatment as rituximab at significant therapeutic benefit which I think outweighs the risk at this point in time.  Patient has received initial cycle of curative fashion times chemotherapy with R-CHOP last week and presents to the clinic for toxicity monitoring. Appears to have tolerated chemotherapy well without any noticeable  side effects so far. This cycle was administered without intrathecal prophylaxis due to high volume of the disease with expectations of tumor lysis syndrome.   It is likely that I will recommend adding intrathecal methotrexate prophylaxis with the subsequent cycles of therapy based on the advanced stage disease including extra lymphatic disease and skeletal involvement. Patient will start receiving methotrexate 12 mg with each cycle of [name] starting with the second.  Plan: --Continue Allopurinol 300mg  PO QDay --Continue Acyclovir  --Decrease dexamethasone to 4mg  every other day --On RTC: Labs, clinic visit, possible Cycle #2/6 of R-CHOP with intrathecal methotrexate for CNS relapse prophylaxis --Denosumab therapy Q6wks due to extensive skeletal involvement

## 2017-08-20 NOTE — Assessment & Plan Note (Signed)
Low phos value noted after the clinic visit. Etiology unclear, but may be related to effects of denosumab in increasing the phos absorption into the bone.  Plan: --Re-check labs on Monday & replace PO/IV if still low

## 2017-08-21 ENCOUNTER — Ambulatory Visit: Payer: Self-pay

## 2017-08-21 ENCOUNTER — Inpatient Hospital Stay (HOSPITAL_COMMUNITY): Payer: Medicaid Other

## 2017-08-21 ENCOUNTER — Encounter (HOSPITAL_COMMUNITY): Payer: Self-pay

## 2017-08-21 ENCOUNTER — Emergency Department (HOSPITAL_COMMUNITY): Payer: Medicaid Other

## 2017-08-21 ENCOUNTER — Inpatient Hospital Stay (HOSPITAL_COMMUNITY)
Admission: EM | Admit: 2017-08-21 | Discharge: 2017-09-03 | DRG: 871 | Disposition: A | Discharge status: Skilled Nursing Facility | Payer: Medicaid Other | Attending: Internal Medicine | Admitting: Internal Medicine

## 2017-08-21 ENCOUNTER — Encounter (HOSPITAL_COMMUNITY)
Admission: EM | Disposition: A | Discharge status: Skilled Nursing Facility | Payer: Self-pay | Source: Home / Self Care | Attending: Internal Medicine

## 2017-08-21 ENCOUNTER — Telehealth: Payer: Self-pay | Admitting: Hematology and Oncology

## 2017-08-21 ENCOUNTER — Inpatient Hospital Stay (HOSPITAL_COMMUNITY): Payer: Medicaid Other | Admitting: Certified Registered"

## 2017-08-21 DIAGNOSIS — C7951 Secondary malignant neoplasm of bone: Secondary | ICD-10-CM | POA: Diagnosis present

## 2017-08-21 DIAGNOSIS — E2740 Unspecified adrenocortical insufficiency: Secondary | ICD-10-CM | POA: Diagnosis present

## 2017-08-21 DIAGNOSIS — A419 Sepsis, unspecified organism: Secondary | ICD-10-CM

## 2017-08-21 DIAGNOSIS — F4323 Adjustment disorder with mixed anxiety and depressed mood: Secondary | ICD-10-CM | POA: Diagnosis not present

## 2017-08-21 DIAGNOSIS — D899 Disorder involving the immune mechanism, unspecified: Secondary | ICD-10-CM | POA: Diagnosis present

## 2017-08-21 DIAGNOSIS — E876 Hypokalemia: Secondary | ICD-10-CM | POA: Diagnosis present

## 2017-08-21 DIAGNOSIS — E43 Unspecified severe protein-calorie malnutrition: Secondary | ICD-10-CM | POA: Diagnosis present

## 2017-08-21 DIAGNOSIS — Z7952 Long term (current) use of systemic steroids: Secondary | ICD-10-CM | POA: Diagnosis not present

## 2017-08-21 DIAGNOSIS — C8338 Diffuse large B-cell lymphoma, lymph nodes of multiple sites: Secondary | ICD-10-CM | POA: Diagnosis not present

## 2017-08-21 DIAGNOSIS — C8330 Diffuse large B-cell lymphoma, unspecified site: Secondary | ICD-10-CM | POA: Diagnosis present

## 2017-08-21 DIAGNOSIS — Z807 Family history of other malignant neoplasms of lymphoid, hematopoietic and related tissues: Secondary | ICD-10-CM

## 2017-08-21 DIAGNOSIS — N136 Pyonephrosis: Secondary | ICD-10-CM | POA: Diagnosis present

## 2017-08-21 DIAGNOSIS — I951 Orthostatic hypotension: Secondary | ICD-10-CM | POA: Diagnosis not present

## 2017-08-21 DIAGNOSIS — D61810 Antineoplastic chemotherapy induced pancytopenia: Secondary | ICD-10-CM | POA: Diagnosis present

## 2017-08-21 DIAGNOSIS — Z6821 Body mass index (BMI) 21.0-21.9, adult: Secondary | ICD-10-CM | POA: Diagnosis not present

## 2017-08-21 DIAGNOSIS — Z87442 Personal history of urinary calculi: Secondary | ICD-10-CM

## 2017-08-21 DIAGNOSIS — N201 Calculus of ureter: Secondary | ICD-10-CM

## 2017-08-21 DIAGNOSIS — R112 Nausea with vomiting, unspecified: Secondary | ICD-10-CM

## 2017-08-21 DIAGNOSIS — D61818 Other pancytopenia: Secondary | ICD-10-CM

## 2017-08-21 DIAGNOSIS — E872 Acidosis, unspecified: Secondary | ICD-10-CM

## 2017-08-21 DIAGNOSIS — R310 Gross hematuria: Secondary | ICD-10-CM | POA: Diagnosis present

## 2017-08-21 DIAGNOSIS — Z7189 Other specified counseling: Secondary | ICD-10-CM | POA: Diagnosis not present

## 2017-08-21 DIAGNOSIS — T451X5A Adverse effect of antineoplastic and immunosuppressive drugs, initial encounter: Secondary | ICD-10-CM | POA: Diagnosis present

## 2017-08-21 DIAGNOSIS — R5081 Fever presenting with conditions classified elsewhere: Secondary | ICD-10-CM | POA: Diagnosis present

## 2017-08-21 DIAGNOSIS — R6521 Severe sepsis with septic shock: Secondary | ICD-10-CM | POA: Diagnosis present

## 2017-08-21 DIAGNOSIS — A4151 Sepsis due to Escherichia coli [E. coli]: Principal | ICD-10-CM | POA: Diagnosis present

## 2017-08-21 DIAGNOSIS — Z515 Encounter for palliative care: Secondary | ICD-10-CM | POA: Diagnosis not present

## 2017-08-21 DIAGNOSIS — D703 Neutropenia due to infection: Secondary | ICD-10-CM | POA: Diagnosis present

## 2017-08-21 DIAGNOSIS — D709 Neutropenia, unspecified: Secondary | ICD-10-CM | POA: Diagnosis not present

## 2017-08-21 DIAGNOSIS — R579 Shock, unspecified: Secondary | ICD-10-CM | POA: Diagnosis not present

## 2017-08-21 DIAGNOSIS — I248 Other forms of acute ischemic heart disease: Secondary | ICD-10-CM | POA: Diagnosis present

## 2017-08-21 DIAGNOSIS — R197 Diarrhea, unspecified: Secondary | ICD-10-CM | POA: Diagnosis present

## 2017-08-21 DIAGNOSIS — N135 Crossing vessel and stricture of ureter without hydronephrosis: Secondary | ICD-10-CM | POA: Diagnosis not present

## 2017-08-21 DIAGNOSIS — K30 Functional dyspepsia: Secondary | ICD-10-CM

## 2017-08-21 DIAGNOSIS — Z5111 Encounter for antineoplastic chemotherapy: Secondary | ICD-10-CM

## 2017-08-21 DIAGNOSIS — R000 Tachycardia, unspecified: Secondary | ICD-10-CM

## 2017-08-21 DIAGNOSIS — E869 Volume depletion, unspecified: Secondary | ICD-10-CM | POA: Diagnosis not present

## 2017-08-21 DIAGNOSIS — R319 Hematuria, unspecified: Secondary | ICD-10-CM

## 2017-08-21 DIAGNOSIS — E861 Hypovolemia: Secondary | ICD-10-CM | POA: Diagnosis not present

## 2017-08-21 DIAGNOSIS — M545 Low back pain: Secondary | ICD-10-CM | POA: Diagnosis not present

## 2017-08-21 HISTORY — PX: IR NEPHROSTOMY PLACEMENT RIGHT: IMG6064

## 2017-08-21 HISTORY — PX: CYSTOSCOPY W/ URETERAL STENT PLACEMENT: SHX1429

## 2017-08-21 LAB — URINALYSIS, ROUTINE W REFLEX MICROSCOPIC
Glucose, UA: NEGATIVE mg/dL
Ketones, ur: 20 mg/dL — AB
NITRITE: NEGATIVE
Protein, ur: 100 mg/dL — AB
SPECIFIC GRAVITY, URINE: 1.021 (ref 1.005–1.030)
pH: 7 (ref 5.0–8.0)

## 2017-08-21 LAB — BLOOD GAS, ARTERIAL
ACID-BASE DEFICIT: 7.6 mmol/L — AB (ref 0.0–2.0)
BICARBONATE: 14.4 mmol/L — AB (ref 20.0–28.0)
DRAWN BY: 312971
O2 CONTENT: 2 L/min
O2 SAT: 96.9 %
PH ART: 7.47 — AB (ref 7.350–7.450)
Patient temperature: 100.8
pCO2 arterial: 20.3 mmHg — ABNORMAL LOW (ref 32.0–48.0)
pO2, Arterial: 92.8 mmHg (ref 83.0–108.0)

## 2017-08-21 LAB — CBC WITH DIFFERENTIAL/PLATELET
BASOS ABS: 0 10*3/uL (ref 0.0–0.1)
Basophils Relative: 0 %
EOS ABS: 0 10*3/uL (ref 0.0–0.7)
Eosinophils Relative: 0 %
HCT: 27.6 % — ABNORMAL LOW (ref 39.0–52.0)
HEMOGLOBIN: 9.8 g/dL — AB (ref 13.0–17.0)
LYMPHS PCT: 34 %
Lymphs Abs: 0 10*3/uL — ABNORMAL LOW (ref 0.7–4.0)
MCH: 25.5 pg — ABNORMAL LOW (ref 26.0–34.0)
MCHC: 35.5 g/dL (ref 30.0–36.0)
MCV: 71.7 fL — ABNORMAL LOW (ref 78.0–100.0)
MONO ABS: 0.1 10*3/uL (ref 0.1–1.0)
Monocytes Relative: 58 %
NEUTROS PCT: 8 %
Neutro Abs: 0 10*3/uL — ABNORMAL LOW (ref 1.7–7.7)
Platelets: 76 10*3/uL — ABNORMAL LOW (ref 150–400)
RBC: 3.85 MIL/uL — ABNORMAL LOW (ref 4.22–5.81)
RDW: 17.4 % — ABNORMAL HIGH (ref 11.5–15.5)
WBC: 0.1 10*3/uL — CL (ref 4.0–10.5)

## 2017-08-21 LAB — LACTIC ACID, PLASMA
LACTIC ACID, VENOUS: 3.6 mmol/L — AB (ref 0.5–1.9)
Lactic Acid, Venous: 2.6 mmol/L (ref 0.5–1.9)

## 2017-08-21 LAB — PHOSPHORUS: Phosphorus: 1.9 mg/dL — ABNORMAL LOW (ref 2.5–4.6)

## 2017-08-21 LAB — CBC
HCT: 22.6 % — ABNORMAL LOW (ref 39.0–52.0)
Hemoglobin: 8.1 g/dL — ABNORMAL LOW (ref 13.0–17.0)
MCH: 26 pg (ref 26.0–34.0)
MCHC: 35.8 g/dL (ref 30.0–36.0)
MCV: 72.4 fL — AB (ref 78.0–100.0)
PLATELETS: 58 10*3/uL — AB (ref 150–400)
RBC: 3.12 MIL/uL — ABNORMAL LOW (ref 4.22–5.81)
RDW: 17.8 % — AB (ref 11.5–15.5)

## 2017-08-21 LAB — I-STAT CG4 LACTIC ACID, ED: LACTIC ACID, VENOUS: 2.06 mmol/L — AB (ref 0.5–1.9)

## 2017-08-21 LAB — CK: CK TOTAL: 65 U/L (ref 49–397)

## 2017-08-21 LAB — COMPREHENSIVE METABOLIC PANEL
ALT: 41 U/L (ref 17–63)
AST: 30 U/L (ref 15–41)
Albumin: 3.1 g/dL — ABNORMAL LOW (ref 3.5–5.0)
Alkaline Phosphatase: 112 U/L (ref 38–126)
Anion gap: 10 (ref 5–15)
BUN: 12 mg/dL (ref 6–20)
CHLORIDE: 103 mmol/L (ref 101–111)
CO2: 22 mmol/L (ref 22–32)
Calcium: 6.9 mg/dL — ABNORMAL LOW (ref 8.9–10.3)
Creatinine, Ser: 0.63 mg/dL (ref 0.61–1.24)
Glucose, Bld: 144 mg/dL — ABNORMAL HIGH (ref 65–99)
POTASSIUM: 3.5 mmol/L (ref 3.5–5.1)
Sodium: 135 mmol/L (ref 135–145)
Total Bilirubin: 2.2 mg/dL — ABNORMAL HIGH (ref 0.3–1.2)
Total Protein: 5.9 g/dL — ABNORMAL LOW (ref 6.5–8.1)

## 2017-08-21 LAB — URIC ACID: URIC ACID, SERUM: 3.8 mg/dL — AB (ref 4.4–7.6)

## 2017-08-21 LAB — PROTIME-INR
INR: 1.23
PROTHROMBIN TIME: 15.4 s — AB (ref 11.4–15.2)

## 2017-08-21 LAB — TROPONIN I: Troponin I: 0.98 ng/mL (ref <0.03)

## 2017-08-21 LAB — BRAIN NATRIURETIC PEPTIDE: B Natriuretic Peptide: 607.1 pg/mL — ABNORMAL HIGH (ref 0.0–100.0)

## 2017-08-21 LAB — MRSA PCR SCREENING: MRSA by PCR: POSITIVE — AB

## 2017-08-21 LAB — GLUCOSE, CAPILLARY: GLUCOSE-CAPILLARY: 105 mg/dL — AB (ref 65–99)

## 2017-08-21 LAB — PROCALCITONIN: Procalcitonin: 67.99 ng/mL

## 2017-08-21 LAB — MAGNESIUM: MAGNESIUM: 1.4 mg/dL — AB (ref 1.7–2.4)

## 2017-08-21 SURGERY — CYSTOSCOPY, WITH RETROGRADE PYELOGRAM AND URETERAL STENT INSERTION
Anesthesia: General | Site: Ureter | Laterality: Right

## 2017-08-21 MED ORDER — FENTANYL CITRATE (PF) 100 MCG/2ML IJ SOLN
INTRAMUSCULAR | Status: AC
  Filled 2017-08-21: qty 2

## 2017-08-21 MED ORDER — SODIUM CHLORIDE 0.9 % IV BOLUS (SEPSIS)
500.0000 mL | Freq: Once | INTRAVENOUS | Status: AC
  Administered 2017-08-21: 500 mL via INTRAVENOUS

## 2017-08-21 MED ORDER — ONDANSETRON HCL 4 MG/2ML IJ SOLN
4.0000 mg | Freq: Four times a day (QID) | INTRAMUSCULAR | Status: DC | PRN
  Administered 2017-08-31: 4 mg via INTRAVENOUS
  Filled 2017-08-21: qty 2

## 2017-08-21 MED ORDER — PROPOFOL 10 MG/ML IV BOLUS
INTRAVENOUS | Status: DC | PRN
  Administered 2017-08-21: 100 mg via INTRAVENOUS

## 2017-08-21 MED ORDER — PHENYLEPHRINE HCL 10 MG/ML IJ SOLN
INTRAMUSCULAR | Status: AC
  Filled 2017-08-21: qty 1

## 2017-08-21 MED ORDER — FENTANYL CITRATE (PF) 100 MCG/2ML IJ SOLN
INTRAMUSCULAR | Status: DC | PRN
  Administered 2017-08-21: 50 ug via INTRAVENOUS

## 2017-08-21 MED ORDER — FENTANYL CITRATE (PF) 100 MCG/2ML IJ SOLN
25.0000 ug | INTRAMUSCULAR | Status: DC | PRN
  Administered 2017-08-22: 50 ug via INTRAVENOUS
  Administered 2017-08-22: 25 ug via INTRAVENOUS
  Administered 2017-08-29 – 2017-08-31 (×2): 50 ug via INTRAVENOUS
  Filled 2017-08-21 (×4): qty 2

## 2017-08-21 MED ORDER — INSULIN ASPART 100 UNIT/ML ~~LOC~~ SOLN
2.0000 [IU] | SUBCUTANEOUS | Status: DC
  Administered 2017-08-22: 4 [IU] via SUBCUTANEOUS

## 2017-08-21 MED ORDER — NOREPINEPHRINE BITARTRATE 1 MG/ML IV SOLN
0.0000 ug/min | INTRAVENOUS | Status: DC
  Administered 2017-08-21: 12 ug/kg/min via INTRAVENOUS
  Administered 2017-08-21: 4 ug/kg/min via INTRAVENOUS
  Administered 2017-08-22: 20 ug/min via INTRAVENOUS
  Administered 2017-08-22: 10 ug/min via INTRAVENOUS
  Administered 2017-08-22: 25 ug/min via INTRAVENOUS
  Filled 2017-08-21 (×8): qty 4

## 2017-08-21 MED ORDER — ACETAMINOPHEN 325 MG PO TABS
650.0000 mg | ORAL_TABLET | Freq: Four times a day (QID) | ORAL | Status: DC | PRN
  Filled 2017-08-21: qty 2

## 2017-08-21 MED ORDER — IOPAMIDOL (ISOVUE-300) INJECTION 61%
INTRAVENOUS | Status: AC
  Filled 2017-08-21: qty 50

## 2017-08-21 MED ORDER — FENTANYL CITRATE (PF) 100 MCG/2ML IJ SOLN
INTRAMUSCULAR | Status: AC | PRN
  Administered 2017-08-21 (×3): 25 ug via INTRAVENOUS

## 2017-08-21 MED ORDER — PROPOFOL 10 MG/ML IV BOLUS
INTRAVENOUS | Status: AC
  Filled 2017-08-21: qty 20

## 2017-08-21 MED ORDER — LACTATED RINGERS IV BOLUS (SEPSIS)
500.0000 mL | Freq: Once | INTRAVENOUS | Status: AC
  Administered 2017-08-21: 500 mL via INTRAVENOUS

## 2017-08-21 MED ORDER — MIDAZOLAM HCL 2 MG/2ML IJ SOLN
INTRAMUSCULAR | Status: AC
  Filled 2017-08-21: qty 4

## 2017-08-21 MED ORDER — IOHEXOL 300 MG/ML  SOLN
INTRAMUSCULAR | Status: DC | PRN
  Administered 2017-08-21: 50 mL via INTRAVENOUS

## 2017-08-21 MED ORDER — VANCOMYCIN HCL IN DEXTROSE 1-5 GM/200ML-% IV SOLN: 1000.0000 mg | Freq: Once | INTRAVENOUS | Status: DC

## 2017-08-21 MED ORDER — ACETAMINOPHEN 650 MG RE SUPP: 650.0000 mg | Freq: Four times a day (QID) | RECTAL | Status: DC | PRN

## 2017-08-21 MED ORDER — IOPAMIDOL (ISOVUE-300) INJECTION 61%: 20.0000 mL | Freq: Once | INTRAVENOUS | Status: DC | PRN

## 2017-08-21 MED ORDER — SODIUM CHLORIDE 0.9 % IV BOLUS (SEPSIS)
1000.0000 mL | Freq: Once | INTRAVENOUS | Status: AC
  Administered 2017-08-21: 1000 mL via INTRAVENOUS

## 2017-08-21 MED ORDER — ALBUMIN HUMAN 5 % IV SOLN
INTRAVENOUS | Status: AC
  Filled 2017-08-21: qty 250

## 2017-08-21 MED ORDER — LIDOCAINE 2% (20 MG/ML) 5 ML SYRINGE
INTRAMUSCULAR | Status: AC
  Filled 2017-08-21: qty 5

## 2017-08-21 MED ORDER — LIDOCAINE 2% (20 MG/ML) 5 ML SYRINGE
INTRAMUSCULAR | Status: DC | PRN
  Administered 2017-08-21: 50 mg via INTRAVENOUS

## 2017-08-21 MED ORDER — PIPERACILLIN-TAZOBACTAM 3.375 G IVPB 30 MIN
3.3750 g | Freq: Once | INTRAVENOUS | Status: AC
  Administered 2017-08-21: 3.375 g via INTRAVENOUS
  Filled 2017-08-21: qty 50

## 2017-08-21 MED ORDER — VANCOMYCIN HCL 10 G IV SOLR
1500.0000 mg | Freq: Once | INTRAVENOUS | Status: AC
  Administered 2017-08-21: 1500 mg via INTRAVENOUS
  Filled 2017-08-21: qty 1500

## 2017-08-21 MED ORDER — MIDAZOLAM HCL 2 MG/2ML IJ SOLN
INTRAMUSCULAR | Status: AC
  Filled 2017-08-21: qty 2

## 2017-08-21 MED ORDER — VASOPRESSIN 20 UNIT/ML IV SOLN
0.0300 [IU]/min | INTRAVENOUS | Status: DC
  Administered 2017-08-21 – 2017-08-22 (×2): 0.03 [IU]/min via INTRAVENOUS
  Filled 2017-08-21 (×2): qty 2

## 2017-08-21 MED ORDER — LIDOCAINE-EPINEPHRINE 1 %-1:100000 IJ SOLN
INTRAMUSCULAR | Status: AC | PRN
  Administered 2017-08-21: 10 mL

## 2017-08-21 MED ORDER — PIPERACILLIN-TAZOBACTAM 3.375 G IVPB
3.3750 g | Freq: Three times a day (TID) | INTRAVENOUS | Status: DC
  Administered 2017-08-21 – 2017-08-22 (×2): 3.375 g via INTRAVENOUS
  Filled 2017-08-21 (×2): qty 50

## 2017-08-21 MED ORDER — OXYCODONE HCL 5 MG PO TABS
5.0000 mg | ORAL_TABLET | ORAL | Status: DC | PRN
  Administered 2017-08-23 – 2017-08-31 (×4): 5 mg via ORAL
  Filled 2017-08-21 (×5): qty 1

## 2017-08-21 MED ORDER — METHYLPREDNISOLONE SODIUM SUCC 125 MG IJ SOLR: 60.0000 mg | Freq: Every day | INTRAMUSCULAR | Status: DC

## 2017-08-21 MED ORDER — PHENYLEPHRINE 40 MCG/ML (10ML) SYRINGE FOR IV PUSH (FOR BLOOD PRESSURE SUPPORT)
PREFILLED_SYRINGE | INTRAVENOUS | Status: AC
  Filled 2017-08-21: qty 20

## 2017-08-21 MED ORDER — LACTATED RINGERS IV SOLN
INTRAVENOUS | Status: DC
  Administered 2017-08-21: 100 mL via INTRAVENOUS
  Administered 2017-08-21 – 2017-08-28 (×10): via INTRAVENOUS

## 2017-08-21 MED ORDER — STERILE WATER FOR IRRIGATION IR SOLN
Status: DC | PRN
  Administered 2017-08-21: 3000 mL

## 2017-08-21 MED ORDER — LACTATED RINGERS IV BOLUS (SEPSIS)
1000.0000 mL | Freq: Once | INTRAVENOUS | Status: AC
  Administered 2017-08-21: 1000 mL via INTRAVENOUS

## 2017-08-21 MED ORDER — MIDAZOLAM HCL 2 MG/2ML IJ SOLN
INTRAMUSCULAR | Status: AC | PRN
  Administered 2017-08-21 (×2): 0.5 mg via INTRAVENOUS

## 2017-08-21 MED ORDER — PHENYLEPHRINE 40 MCG/ML (10ML) SYRINGE FOR IV PUSH (FOR BLOOD PRESSURE SUPPORT)
PREFILLED_SYRINGE | INTRAVENOUS | Status: DC | PRN
  Administered 2017-08-21 (×2): 80 ug via INTRAVENOUS
  Administered 2017-08-21: 120 ug via INTRAVENOUS
  Administered 2017-08-21 (×2): 80 ug via INTRAVENOUS
  Administered 2017-08-21: 120 ug via INTRAVENOUS

## 2017-08-21 MED ORDER — VANCOMYCIN HCL IN DEXTROSE 750-5 MG/150ML-% IV SOLN
750.0000 mg | Freq: Three times a day (TID) | INTRAVENOUS | Status: DC
  Administered 2017-08-22: 750 mg via INTRAVENOUS
  Filled 2017-08-21 (×3): qty 150

## 2017-08-21 MED ORDER — PANTOPRAZOLE SODIUM 40 MG PO TBEC: 40.0000 mg | DELAYED_RELEASE_TABLET | Freq: Every day | ORAL | Status: DC

## 2017-08-21 MED ORDER — SODIUM CHLORIDE 0.9% FLUSH
5.0000 mL | Freq: Three times a day (TID) | INTRAVENOUS | Status: DC
  Administered 2017-08-21 – 2017-09-02 (×22): 5 mL via INTRAVENOUS

## 2017-08-21 MED ORDER — SODIUM PHOSPHATES 45 MMOLE/15ML IV SOLN
20.0000 mmol | Freq: Once | INTRAVENOUS | Status: AC
  Administered 2017-08-21: 20 mmol via INTRAVENOUS
  Filled 2017-08-21: qty 6.67

## 2017-08-21 MED ORDER — PHENYLEPHRINE HCL 10 MG/ML IJ SOLN
INTRAVENOUS | Status: DC | PRN
  Administered 2017-08-21: 50 ug/min via INTRAVENOUS

## 2017-08-21 MED ORDER — FENTANYL CITRATE (PF) 100 MCG/2ML IJ SOLN
INTRAMUSCULAR | Status: AC
  Filled 2017-08-21: qty 4

## 2017-08-21 MED ORDER — HYDROCORTISONE NA SUCCINATE PF 100 MG IJ SOLR
100.0000 mg | Freq: Four times a day (QID) | INTRAMUSCULAR | Status: DC
  Administered 2017-08-21 – 2017-08-22 (×3): 100 mg via INTRAVENOUS
  Filled 2017-08-21 (×3): qty 2

## 2017-08-21 MED ORDER — LIDOCAINE-EPINEPHRINE (PF) 2 %-1:200000 IJ SOLN
INTRAMUSCULAR | Status: AC
  Filled 2017-08-21: qty 20

## 2017-08-21 MED ORDER — ALBUMIN HUMAN 25 % IV SOLN
INTRAVENOUS | Status: DC | PRN
  Administered 2017-08-21 (×2): via INTRAVENOUS

## 2017-08-21 MED ORDER — MIDAZOLAM HCL 5 MG/5ML IJ SOLN
INTRAMUSCULAR | Status: DC | PRN
  Administered 2017-08-21: 1 mg via INTRAVENOUS

## 2017-08-21 MED ORDER — TBO-FILGRASTIM 480 MCG/0.8ML ~~LOC~~ SOSY
480.0000 ug | PREFILLED_SYRINGE | Freq: Every day | SUBCUTANEOUS | Status: DC
  Administered 2017-08-22 – 2017-08-25 (×4): 480 ug via SUBCUTANEOUS
  Filled 2017-08-21 (×5): qty 0.8

## 2017-08-21 SURGICAL SUPPLY — 21 items
BAG URINE DRAINAGE (UROLOGICAL SUPPLIES) ×3 IMPLANT
BAG URO CATCHER STRL LF (MISCELLANEOUS) ×3 IMPLANT
BASKET ZERO TIP NITINOL 2.4FR (BASKET) IMPLANT
CATH FOLEY 2WAY SLVR  5CC 16FR (CATHETERS) ×2
CATH FOLEY 2WAY SLVR 5CC 16FR (CATHETERS) ×1 IMPLANT
CATH IMAGER II 65CM (CATHETERS) ×3 IMPLANT
CATH INTERMIT  6FR 70CM (CATHETERS) IMPLANT
CATH UROLOGY TORQUE 40 (MISCELLANEOUS) ×3 IMPLANT
CLOTH BEACON ORANGE TIMEOUT ST (SAFETY) ×3 IMPLANT
COVER FOOTSWITCH UNIV (MISCELLANEOUS) IMPLANT
COVER SURGICAL LIGHT HANDLE (MISCELLANEOUS) ×3 IMPLANT
GLOVE BIOGEL M STRL SZ7.5 (GLOVE) ×3 IMPLANT
GOWN STRL REUS W/TWL LRG LVL3 (GOWN DISPOSABLE) ×6 IMPLANT
GUIDEWIRE ANG ZIPWIRE 038X150 (WIRE) ×3 IMPLANT
GUIDEWIRE STR DUAL SENSOR (WIRE) ×3 IMPLANT
HOLDER FOLEY CATH W/STRAP (MISCELLANEOUS) ×3 IMPLANT
MANIFOLD NEPTUNE II (INSTRUMENTS) ×3 IMPLANT
PACK CYSTO (CUSTOM PROCEDURE TRAY) ×3 IMPLANT
STENT URET 6FRX26 CONTOUR (STENTS) IMPLANT
TUBING CONNECTING 10 (TUBING) ×2 IMPLANT
TUBING CONNECTING 10' (TUBING) ×1

## 2017-08-21 NOTE — Sedation Documentation (Signed)
Patient denies pain and is resting comfortably.  

## 2017-08-21 NOTE — ED Notes (Addendum)
Pt's Brother Raine Elsass): [phone]

## 2017-08-21 NOTE — Transfer of Care (Signed)
Immediate Anesthesia Transfer of Care Note  Patient: Cory Ibarra  Procedure(s) Performed: Procedure(s): CYSTOSCOPY WITH RETROGRADE PYELOGRAM, RIGHT (Right)  Patient Location: PACU and ICU  Anesthesia Type:General  Level of Consciousness: awake, alert  and oriented  Airway & Oxygen Therapy: Patient Spontanous Breathing and Patient connected to face mask oxygen  Post-op Assessment: Report given to RN and Post -op Vital signs reviewed and stable  Post vital signs: Reviewed and unstable  Last Vitals:  Vitals:   08/21/17 1805 08/21/17 1830  BP: (!) 64/18 126/61  Pulse: (!) 128 (!) 146  Resp: (!) 37 (!) 25  Temp:    SpO2: 99% 99%    Last Pain:  Vitals:   08/21/17 1441  TempSrc: Oral  PainSc:          Complications: No apparent anesthesia complications

## 2017-08-21 NOTE — Anesthesia Procedure Notes (Signed)
Procedure Name: LMA Insertion Date/Time: 08/21/2017 7:47 PM Performed by: Noralyn Pick D Pre-anesthesia Checklist: Patient identified, Emergency Drugs available, Suction available and Patient being monitored Patient Re-evaluated:Patient Re-evaluated prior to induction Oxygen Delivery Method: Circle system utilized Preoxygenation: Pre-oxygenation with 100% oxygen Induction Type: IV induction Ventilation: Mask ventilation without difficulty LMA: LMA inserted LMA Size: 4.0 Tube type: Oral Number of attempts: 1 Placement Confirmation: positive ETCO2 and breath sounds checked- equal and bilateral Tube secured with: Tape Dental Injury: Teeth and Oropharynx as per pre-operative assessment

## 2017-08-21 NOTE — Brief Op Note (Signed)
08/21/2017  8:14 PM  PATIENT:  Cory Ibarra  56 y.o. male  PRE-OPERATIVE DIAGNOSIS:  sepsis, right ureteral stone  POST-OPERATIVE DIAGNOSIS:  sepsis, right ureteral stone  PROCEDURE:  Procedure(s): CYSTOSCOPY WITH RETROGRADE PYELOGRAM, RIGHT (Right)  SURGEON:  Surgeon(s) and Role:    Alexis Frock, MD - Primary  PHYSICIAN ASSISTANT:   ASSISTANTS: none   ANESTHESIA:   general  EBL:  Total I/O In: 250 [IV Piggyback:250] Out: -   BLOOD ADMINISTERED:none  DRAINS: 38F foley to gravity   LOCAL MEDICATIONS USED:  NONE  SPECIMEN:  No Specimen  DISPOSITION OF SPECIMEN:  N/A  COUNTS:  YES  TOURNIQUET:  * No tourniquets in log *  DICTATION: .Other Dictation: Dictation Number [id number]  PLAN OF CARE: Admit to inpatient   PATIENT DISPOSITION:  PACU - guarded condition.   Delay start of Pharmacological VTE agent (>24hrs) due to surgical blood loss or risk of bleeding: yes

## 2017-08-21 NOTE — Consult Note (Signed)
Chief Complaint: Urosepsis  Referring Physician(s): Tammi Klippel (Urology)  Patient Status: [facility] - In-pt  History of Present Illness: Cory Ibarra is a 56 y.o. male with past medical history significant for newly diagnosed extensive B-cell lymphoma who presented earlier this afternoon to the [facility] emergency department with nausea, vomiting, hematuria, mild shortness of breath and new onset of fever and diarrhea at home who is admitted with concern for urosepsis secondary to known right-sided ureteral stone.   Patient was taken to the operating room by Dr. Tammi Klippel however attempted right-sided retrograde ureteral stent placement was unsuccessful and as such, request was made for ultrasound and fluoroscopic guided placement of right-sided percutaneous nephrostomy catheter for infection source control purposes.  Patient remains hypotensive, currently on norepinephrine and receiving a fluid bolus over the patient remains alert and oriented.    Past Medical History:  Diagnosis Date  . Cancer ([place])   . Tendonitis     Past Surgical History:  Procedure Laterality Date  . HERNIA REPAIR  1988   Dr. Vida Rigger  . IR FLUORO GUIDE PORT INSERTION RIGHT  07/26/2017  . IR US GUIDE VASC ACCESS RIGHT  07/26/2017    Allergies: Bee venom  Medications: Prior to Admission medications   Medication Sig Start Date End Date Taking? Authorizing Provider  allopurinol (ZYLOPRIM) 300 MG tablet Take 1 tablet (300 mg total) by mouth daily. 07/16/17 08/21/17 Yes Perlov, Marinell Blight, MD  dexamethasone (DECADRON) 4 MG tablet Take 4 mg by mouth every other day.    Yes [provider]  lidocaine-prilocaine (EMLA) cream Apply to affected area once 07/24/17  Yes Perlov, Marinell Blight, MD  LORazepam (ATIVAN) 0.5 MG tablet Take 1 tablet (0.5 mg total) by mouth every 6 (six) hours as needed (Nausea or vomiting). 07/24/17  Yes Perlov, Marinell Blight, MD  oxyCODONE (OXY IR/ROXICODONE) 5 MG immediate release tablet Take 1  tablet (5 mg total) by mouth every 4 (four) hours as needed for moderate pain or severe pain. 08/13/17 09/03/17 Yes Hayden Pedro, PA-C  pantoprazole (PROTONIX) 40 MG tablet Take 1 tablet (40 mg total) by mouth daily at 12 noon. 08/13/17  Yes Hayden Pedro, PA-C  polyethylene glycol [facility] Floria Raveling) packet Take 17 g by mouth daily as needed for mild constipation. 07/03/17  Yes Domenic Polite, MD  ondansetron (ZOFRAN) 8 MG tablet Take 1 tablet (8 mg total) by mouth 2 (two) times daily as needed for refractory nausea / vomiting. Start on day 3 after cyclophosphamide chemotherapy. Patient not taking: Reported on 08/21/2017 07/24/17   Ardath Sax, MD     Family History  Problem Relation Age of Onset  . Cancer Father     Social History   Social History  . Marital status: Single    Spouse name: N/A  . Number of children: 0  . Years of education: N/A   Social History Main Topics  . Smoking status: Never Smoker  . Smokeless tobacco: Never Used  . Alcohol use Yes     Comment: occasionally/socially  . Drug use: No  . Sexual activity: No   Other Topics Concern  . 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 Never used smokeless tobacco.   No e-cigarettes   Occasional alcohol consumption socially.   Single, no children.    ECOG Status: 3 - Symptomatic, >50% confined to bed  Review of Systems: A 12 point ROS discussed and pertinent positives  are indicated in the HPI above.  All other systems are negative.  Review of Systems  Constitutional: Positive for fever.  Respiratory: Positive for shortness of breath.   Genitourinary: Positive for flank pain and hematuria.    Vital Signs: BP (!) 86/51   Pulse (!) 127   Temp 98.2 F (36.8 C) (Oral)   Resp (!) 29   Ht 6\' 2"  (1.88 m)   Wt 168 lb 14 oz (76.6 kg)   SpO2 100%   BMI 21.68 kg/m   Physical Exam  Imaging: Dg Chest 2 View  Result Date:  08/21/2017 CLINICAL DATA:  Dizzy in short of breath EXAM: CHEST  2 VIEW COMPARISON:  06/28/2017 FINDINGS: Right jugular Port-A-Cath placed with its tip at the cavoatrial junction. No pneumothorax. Lungs are hyperaerated and clear. Normal heart size. No pleural effusion. IMPRESSION: No active cardiopulmonary disease. Electronically Signed   By: Marybelle Killings M.D.   On: 08/21/2017 13:50   Dg Thoracic Spine 2 View  Result Date: 08/21/2017 CLINICAL DATA:  Pain. History of lower B-cell non-Hodgkin's lymphoma EXAM: THORACIC SPINE 3 VIEWS COMPARISON:  MR thoracic spine June 28, 2017 and chest radiograph August 21, 2017 FINDINGS: Frontal, lateral, and swimmer's views obtained. There is no evident acute fracture or spondylolisthesis. The pedicle at T12 on the right is no longer apparent and is presumably destroyed by neoplasm. This finding was evident on prior MR. there is subtle loss of detail along the lateral left T8 pedicle, noted by prior MR to be involved with tumor. There is no obvious neoplastic involvement vertebral bodies by radiography. MR shows tumor involving several vertebral bodies. IMPRESSION: No fracture or spondylolisthesis. There are multiple metastatic foci seen in the thoracic spine performed three months prior. By radiography, there is loss of the pedicle of T12 on the right and subtle loss of the lateral aspect of the pedicle of T8 on the left. 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 No other neoplastic foci identified by radiography the bony structures. Note that there are multiple foci of tumor and thoracic vertebral bodies seen on recent MR not seen by radiography currently. Electronically Signed   By: Lowella Grip III M.D.   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  Electronically Signed   By: Lowella Grip III M.D.   On: 08/21/2017 16:02   Result Date: 08/21/2017 CLINICAL DATA:  Pain. History of large B-cell non-Hodgkin's lymphoma EXAM: LUMBAR SPINE - COMPLETE 4+ VIEW COMPARISON:  Lumbar MRI June 28, 2017 FINDINGS: Frontal, lateral, spot lumbosacral lateral, and bilateral oblique views were obtained. There are 5 non-rib-bearing lumbar type vertebral bodies. There is a stable fracture of the L3 vertebral body, more severe along the leftward aspect. There is lucency in this area suggesting metastatic focus as likely etiology for the fracture. There is apparent destruction in the region of the pedicle on the left at L3. No other fracture evident. No spondylolisthesis. Disc spaces appear normal. No other lytic or destructive lesions. There is a probable bone island medial upper right iliac crest. There is facet osteoarthritic change at L4-5 and L5-S1 bilaterally. IMPRESSION: There is again noted fracture L3 vertebral body. Tumor extends into and involves the left L3 pedicle. This finding was present on prior lumbar MR. No new bone lesions are  evident. No new fracture. No spondylolisthesis. There is facet osteoarthritic change at L4-5 L5-S1 bilaterally. Electronically Signed: By: Lowella Grip III M.D. On: 08/21/2017 15:52   Ct Chest W Contrast  Result Date: 07/30/2017 CLINICAL DATA:  Followup diffuse large B-cell non-Hodgkin's lymphoma diagnosed in July 2018 with bony metastases. Status post radiation therapy to the chest and a pathological L-spine fracture. EXAM: CT CHEST, ABDOMEN, AND PELVIS WITH CONTRAST TECHNIQUE: Multidetector CT imaging of the chest, abdomen and pelvis was performed following the standard protocol during bolus administration of intravenous contrast. CONTRAST:  170mL ISOVUE-300 IOPAMIDOL (ISOVUE-300) INJECTION 61% COMPARISON:  Thoracic and lumbar spine MR dated 06/28/2017 and abdomen and pelvis CT dated 06/28/2017. FINDINGS: CT CHEST FINDINGS  Cardiovascular: Normal sized heart. Small amount of aortic calcification. Right jugular porta catheter tip in the inferior aspect of the superior vena cava. Mediastinum/Nodes: No enlarged lymph nodes. Normal appearing esophagus and thyroid gland. Lungs/Pleura: Stable linear scarring at the right lung base. No lung nodules or pleural fluid. Musculoskeletal: Left anterior chest wall mass arising from the anterior aspect of the left third rib. This measures 4.9 x 3.4 cm on image number 36 series 2. Right anterior chest wall mass arising from the anterior aspect of the sixth rib, measuring 5.9 x 3.0 cm on image number 53 series 2, previously 5.6 x 3.9 cm on 06/28/2017. Smaller lytic and expansile lesion in the lateral aspect of the left third rib on images 19 and 20 series 2. Similar changes involving the posterior aspect of the left seventh rib on image number 31 series 2. This appears smaller compared to the MR dated 06/28/2017. Similar expansile lesion involving the posteromedial aspect of the left scapula on image number 31 series 2. The previously demonstrated lytic lesion in the right T10 vertebral body and posterior elements demonstrates some interval calcification without significant change in extent and with interval superior endplate compression. The previously demonstrated lytic, expansile lesion involving the right vertebral body and posterior elements at the T12 level is smaller without well-visualized tumor in the canal on the right, improved. There is interval mild collapse of the posterior aspect of the vertebral body on the right. CT ABDOMEN PELVIS FINDINGS Hepatobiliary: Distended gallbladder.  Normal appearing liver. Pancreas: Unremarkable. No pancreatic ductal dilatation or surrounding inflammatory changes. Spleen: Normal in size without focal abnormality. Adrenals/Urinary Tract: 4 mm calculus in the mid right kidney and adjacent 5 mm calculi in the lower right kidney. Additional smaller calculus in  the mid to upper right kidney. The right kidney remains smaller than the left kidney with no left renal calculi seen. There is a 10 mm proximal right ureteral calculus with no dilatation of the right renal collecting system. No left ureteral calculi or bladder calculi seen. No distal right ureteral calculi visualized. Multiple bilateral pelvic phleboliths are again demonstrated. Stomach/Bowel: Stomach is within normal limits. Appendix appears normal. No evidence of bowel wall thickening, distention, or inflammatory changes. Vascular/Lymphatic: No vascular calcifications are aneurysm. The previously demonstrated 6.7 x 5.1 cm left para-aortic retroperitoneal lymph node mass is much smaller, currently measuring 3.1 x 1.7 cm on image number 81 series 2. A more inferior left para-aortic node previously measuring 3.6 x 3.3 cm currently measures 1.8 x 1.2 cm on image number 89 series 2. No new or enlarging lymph nodes are seen. Reproductive: Prostate is unremarkable. Other: Proximal left inguinal hernia containing fat. No free peritoneal fluid. Musculoskeletal: The previously demonstrated left L3 vertebral destructive mass is significantly smaller with decreased size of  the previously demonstrated component within the spinal canal. There is also mildly progressive collapse and sclerosis of the L3 vertebral body. There has been an increase in size of a similar-appearing mass in anterior aspect of the right iliac bone, currently measuring 4.0 x 3.4 cm on image number 103 series 2. There is also been increase in size of a similar-appearing mass in the right pubic body, currently measuring approximately 3.1 x 2.8 cm on image number 125 series 2, extending into the symphysis pubis. IMPRESSION: 1. Significantly improved retroperitoneal adenopathy on the left. 2. Extensive bony metastatic disease, including bilateral ribs, left scapula, thoracic and lumbar vertebrae, right iliac bone and right pubic bone. Some of these are  improved following treatment and others are larger were not previously imaged. 3. 10 mm proximal right ureteral calculus without hydronephrosis. 4. Multiple nonobstructing right renal calculi. 5. Mild aortic atherosclerosis. Electronically Signed   By: Claudie Revering M.D.   On: 07/30/2017 16:35   Ct Abdomen Pelvis W Contrast  Result Date: 07/30/2017 CLINICAL DATA:  Followup diffuse large B-cell non-Hodgkin's lymphoma diagnosed in July 2018 with bony metastases. Status post radiation therapy to the chest and a pathological L-spine fracture. EXAM: CT CHEST, ABDOMEN, AND PELVIS WITH CONTRAST TECHNIQUE: Multidetector CT imaging of the chest, abdomen and pelvis was performed following the standard protocol during bolus administration of intravenous contrast. CONTRAST:  141mL ISOVUE-300 IOPAMIDOL (ISOVUE-300) INJECTION 61% COMPARISON:  Thoracic and lumbar spine MR dated 06/28/2017 and abdomen and pelvis CT dated 06/28/2017. FINDINGS: CT CHEST FINDINGS Cardiovascular: Normal sized heart. Small amount of aortic calcification. Right jugular porta catheter tip in the inferior aspect of the superior vena cava. Mediastinum/Nodes: No enlarged lymph nodes. Normal appearing esophagus and thyroid gland. Lungs/Pleura: Stable linear scarring at the right lung base. No lung nodules or pleural fluid. Musculoskeletal: Left anterior chest wall mass arising from the anterior aspect of the left third rib. This measures 4.9 x 3.4 cm on image number 36 series 2. Right anterior chest wall mass arising from the anterior aspect of the sixth rib, measuring 5.9 x 3.0 cm on image number 53 series 2, previously 5.6 x 3.9 cm on 06/28/2017. Smaller lytic and expansile lesion in the lateral aspect of the left third rib on images 19 and 20 series 2. Similar changes involving the posterior aspect of the left seventh rib on image number 31 series 2. This appears smaller compared to the MR dated 06/28/2017. Similar expansile lesion involving the  posteromedial aspect of the left scapula on image number 31 series 2. The previously demonstrated lytic lesion in the right T10 vertebral body and posterior elements demonstrates some interval calcification without significant change in extent and with interval superior endplate compression. The previously demonstrated lytic, expansile lesion involving the right vertebral body and posterior elements at the T12 level is smaller without well-visualized tumor in the canal on the right, improved. There is interval mild collapse of the posterior aspect of the vertebral body on the right. CT ABDOMEN PELVIS FINDINGS Hepatobiliary: Distended gallbladder.  Normal appearing liver. Pancreas: Unremarkable. No pancreatic ductal dilatation or surrounding inflammatory changes. Spleen: Normal in size without focal abnormality. Adrenals/Urinary Tract: 4 mm calculus in the mid right kidney and adjacent 5 mm calculi in the lower right kidney. Additional smaller calculus in the mid to upper right kidney. The right kidney remains smaller than the left kidney with no left renal calculi seen. There is a 10 mm proximal right ureteral calculus with no dilatation of the right renal  collecting system. No left ureteral calculi or bladder calculi seen. No distal right ureteral calculi visualized. Multiple bilateral pelvic phleboliths are again demonstrated. Stomach/Bowel: Stomach is within normal limits. Appendix appears normal. No evidence of bowel wall thickening, distention, or inflammatory changes. Vascular/Lymphatic: No vascular calcifications are aneurysm. The previously demonstrated 6.7 x 5.1 cm left para-aortic retroperitoneal lymph node mass is much smaller, currently measuring 3.1 x 1.7 cm on image number 81 series 2. A more inferior left para-aortic node previously measuring 3.6 x 3.3 cm currently measures 1.8 x 1.2 cm on image number 89 series 2. No new or enlarging lymph nodes are seen. Reproductive: Prostate is unremarkable.  Other: Proximal left inguinal hernia containing fat. No free peritoneal fluid. Musculoskeletal: The previously demonstrated left L3 vertebral destructive mass is significantly smaller with decreased size of the previously demonstrated component within the spinal canal. There is also mildly progressive collapse and sclerosis of the L3 vertebral body. There has been an increase in size of a similar-appearing mass in anterior aspect of the right iliac bone, currently measuring 4.0 x 3.4 cm on image number 103 series 2. There is also been increase in size of a similar-appearing mass in the right pubic body, currently measuring approximately 3.1 x 2.8 cm on image number 125 series 2, extending into the symphysis pubis. IMPRESSION: 1. Significantly improved retroperitoneal adenopathy on the left. 2. Extensive bony metastatic disease, including bilateral ribs, left scapula, thoracic and lumbar vertebrae, right iliac bone and right pubic bone. Some of these are improved following treatment and others are larger were not previously imaged. 3. 10 mm proximal right ureteral calculus without hydronephrosis. 4. Multiple nonobstructing right renal calculi. 5. Mild aortic atherosclerosis. Electronically Signed   By: Claudie Revering M.D.   On: 07/30/2017 16:35   Ir US Guide Vasc Access Right  Result Date: 07/26/2017 INDICATION: 56 year old with diffuse large B-cell lymphoma. Port-A-Cath needed for treatment. EXAM: FLUOROSCOPIC AND ULTRASOUND GUIDED PLACEMENT OF A SUBCUTANEOUS PORT COMPARISON:  None. MEDICATIONS: Ancef 2 g; The antibiotic was administered within an appropriate time interval prior to skin puncture. ANESTHESIA/SEDATION: Versed 4 mg IV; Fentanyl 100 mcg IV; Moderate Sedation Time:  29 minutes The patient was continuously monitored during the procedure by the interventional radiology nurse under my direct supervision. FLUOROSCOPY TIME:  30 seconds, 3 mGy COMPLICATIONS: None immediate. PROCEDURE: The procedure, risks,  benefits, and alternatives were explained to the patient. Questions regarding the procedure were encouraged and answered. The patient understands and consents to the procedure. Patient was placed supine on the interventional table. Ultrasound confirmed a patent right internal jugular vein. The right chest and neck were cleaned with a skin antiseptic and a sterile drape was placed. Maximal barrier sterile technique was utilized including caps, mask, sterile gowns, sterile gloves, sterile drape, hand hygiene and skin antiseptic. The right neck was anesthetized with 1% lidocaine. Small incision was made in the right neck with a blade. Micropuncture set was placed in the right internal jugular vein with ultrasound guidance. The micropuncture wire was used for measurement purposes. The right chest was anesthetized with 1% lidocaine with epinephrine. #15 blade was used to make an incision and a subcutaneous port pocket was formed. [place] was assembled. Subcutaneous tunnel was formed with a stiff tunneling device. The port catheter was brought through the subcutaneous tunnel. The port was placed in the subcutaneous pocket. The micropuncture set was exchanged for a peel-away sheath. The catheter was placed through the peel-away sheath and the tip was positioned  at the superior cavoatrial junction. Catheter placement was confirmed with fluoroscopy. The port was accessed and flushed with heparinized saline. The port pocket was closed using two layers of absorbable sutures and Dermabond. The vein skin site was closed using a single layer of absorbable suture and Dermabond. Sterile dressings were applied. Patient tolerated the procedure well without an immediate complication. Ultrasound and fluoroscopic images were taken and saved for this procedure. IMPRESSION: Placement of a subcutaneous port device. Electronically Signed   By: Markus Daft M.D.   On: 07/26/2017 13:50   Dg C-arm 1-60 Min-no Report  Result Date:  08/21/2017 Fluoroscopy was utilized by the requesting physician.  No radiographic interpretation.   Ir Fluoro Guide Port Insertion Right  Result Date: 07/26/2017 INDICATION: 56 year old with diffuse large B-cell lymphoma. Port-A-Cath needed for treatment. EXAM: FLUOROSCOPIC AND ULTRASOUND GUIDED PLACEMENT OF A SUBCUTANEOUS PORT COMPARISON:  None. MEDICATIONS: Ancef 2 g; The antibiotic was administered within an appropriate time interval prior to skin puncture. ANESTHESIA/SEDATION: Versed 4 mg IV; Fentanyl 100 mcg IV; Moderate Sedation Time:  29 minutes The patient was continuously monitored during the procedure by the interventional radiology nurse under my direct supervision. FLUOROSCOPY TIME:  30 seconds, 3 mGy COMPLICATIONS: None immediate. PROCEDURE: The procedure, risks, benefits, and alternatives were explained to the patient. Questions regarding the procedure were encouraged and answered. The patient understands and consents to the procedure. Patient was placed supine on the interventional table. Ultrasound confirmed a patent right internal jugular vein. The right chest and neck were cleaned with a skin antiseptic and a sterile drape was placed. Maximal barrier sterile technique was utilized including caps, mask, sterile gowns, sterile gloves, sterile drape, hand hygiene and skin antiseptic. The right neck was anesthetized with 1% lidocaine. Small incision was made in the right neck with a blade. Micropuncture set was placed in the right internal jugular vein with ultrasound guidance. The micropuncture wire was used for measurement purposes. The right chest was anesthetized with 1% lidocaine with epinephrine. #15 blade was used to make an incision and a subcutaneous port pocket was formed. [place] was assembled. Subcutaneous tunnel was formed with a stiff tunneling device. The port catheter was brought through the subcutaneous tunnel. The port was placed in the subcutaneous pocket. The  micropuncture set was exchanged for a peel-away sheath. The catheter was placed through the peel-away sheath and the tip was positioned at the superior cavoatrial junction. Catheter placement was confirmed with fluoroscopy. The port was accessed and flushed with heparinized saline. The port pocket was closed using two layers of absorbable sutures and Dermabond. The vein skin site was closed using a single layer of absorbable suture and Dermabond. Sterile dressings were applied. Patient tolerated the procedure well without an immediate complication. Ultrasound and fluoroscopic images were taken and saved for this procedure. IMPRESSION: Placement of a subcutaneous port device. Electronically Signed   By: Markus Daft M.D.   On: 07/26/2017 13:50    Labs:  CBC:  Recent Labs  07/26/17 1049 08/08/17 1329 08/16/17 0924 08/21/17 1316  WBC 5.2 4.9 2.7* 0.1*  HGB 12.3* 12.2* 11.0* 9.8*  HCT 36.1* 36.6* 32.3* 27.6*  PLT 179 255 158 76*    COAGS:  Recent Labs  06/29/17 0847 07/26/17 1049 08/21/17 1316  INR 1.06 1.13 1.23  APTT  --  29  --     BMP:  Recent Labs  06/28/17 0919 06/29/17 0351  07/26/17 1049 08/08/17 1329 08/16/17 0924 08/21/17 1316  NA 138 139  < >  130* 137 135* 135  K 4.1 4.1  < > 3.5 3.5 3.6 3.5  CL 104 103  --  94*  --   --  103  CO2 23 27  < > 25 27 25 22   GLUCOSE 108* 130*  < > 92 101 83 144*  BUN 14 20  < > 18 12.3 10.4 12  CALCIUM 9.1 10.0  < > 9.4 10.6* 8.1* 6.9*  CREATININE 0.84 0.70  < > 0.68 0.8 0.6* 0.63  GFRNONAA >60 >60  --  >60  --   --  >60  GFRAA >60 >60  --  >60  --   --  >60  < > = values in this interval not displayed.  LIVER FUNCTION TESTS:  Recent Labs  07/16/17 1433 08/08/17 1329 08/16/17 0924 08/21/17 1316  BILITOT 1.27* 0.85 0.89 2.2*  AST 44* 22 27 30   ALT 148* 22 59* 41  ALKPHOS 173* 106 110 112  PROT 7.1 6.9 6.4 5.9*  ALBUMIN 3.7 3.4* 3.4* 3.1*    TUMOR MARKERS: No results for input(s): AFPTM, CEA, CA199, CHROMGRNA in the  last 8760 hours.  Assessment and Plan:  Cory Ibarra is a 56 y.o. male with past medical history significant for newly diagnosed extensive B-cell lymphoma who presented earlier this afternoon to the [facility] emergency department with nausea, vomiting, hematuria, mild shortness of breath and new onset of fever and diarrhea at home who is admitted with concern for urosepsis secondary to known right-sided ureteral stone.   Patient was taken to the operating room by Dr. Tammi Klippel however attempted right-sided retrograde ureteral stent placement was unsuccessful and as such, request was made for ultrasound and fluoroscopic guided placement of right-sided percutaneous nephrostomy catheter for infection source control purposes.  Risks and benefits of right sided PCN placement were discussed with the patient including, but not limited to, infection, bleeding, significant bleeding causing loss or decrease in renal function or damage to adjacent structures.   All of the patient's questions were answered, patient is agreeable to proceed.  Consent signed and in chart.  Thank you for this interesting consult.  I greatly enjoyed meeting Oryan Winterton and look forward to participating in their care.  A copy of this report was sent to the requesting provider on this date.  Electronically Signed: Sandi Mariscal, MD 08/21/2017, 9:40 PM   I spent a total of 20 Minutes in face to face in clinical consultation, greater than 50% of which was counseling/coordinating care for right sided PCN placement.

## 2017-08-21 NOTE — ED Triage Notes (Signed)
Per EMS: Pt is coming from home Pt is a 56 year old male cancer pt. Pt has chemo September the 6th the last time. Pt has a right side chest port. Pt woke up dizzy and SOB. Pt reports he had blood in his urine.  Last BP: 94/54 HR 125 Sinus tach  CBG 211  96% RA but on 4L for comfort. Pt is currently on prednisone and tramadol.  Pt has a 20 in LFA Pt has had a 500 bolus.  97.9 temp with EMS Pt is AxO x4

## 2017-08-21 NOTE — Consult Note (Signed)
Reason for Consult: Gross Hematuria, Right Ureteral + Renal Stones, Bacteruria, Prostate Screening  Referring Physician: Chipper Oman MD  Cory Ibarra is an 56 y.o. male.   HPI:   1 - Gross Hematuria - new gross hematuria 08/2017. No clots.  Non-smoker. Known right ureteral stone as per below. Contrast CT within last 30 days w/o other GU masses. Known thrombocytopenia as well at 70k.  2 - Right Ureteral + Renal Stones - 9m Rt lower pole + 161mprox ureteral stone by CT 07/2017 incidental on lymphoma staging. No colic or interval passage.  3 - Bacteruria -large bacteruria noted during admission for neutropenic fever 08/2017. Admits to some irritative voiding x few days.   4 - Prostate Screening - PSA 2018 3.25.  PMH sig for lymphoma (just started chemo).   Today "Cory Ibarra seen as emergent consult for above. He is admitted to ICU for sepsis / neutropenic fever having recently started chemo for lymphoma. He also has copious loose stool.    Past Medical History:  Diagnosis Date  . Cancer ([place]  . Tendonitis     Past Surgical History:  Procedure Laterality Date  . HERNIA REPAIR  1988   Dr. FaVida Rigger. IR FLUORO GUIDE PORT INSERTION RIGHT  07/26/2017  . IR [place] VASC ACCESS RIGHT  07/26/2017    Family History  Problem Relation Age of Onset  . Cancer Father     Social History:  reports that he has never smoked. He has never used smokeless tobacco. He reports that he drinks alcohol. He reports that he does not use drugs.  Allergies:  Allergies  Allergen Reactions  . Bee Venom Swelling    Medications: I have reviewed the patient's current medications.  Results for orders placed or performed during the hospital encounter of 08/21/17 (from the past 48 hour(s))  Comprehensive metabolic panel     Status: Abnormal   Collection Time: 08/21/17  1:16 PM  Result Value Ref Range   Sodium 135 135 - 145 mmol/L   Potassium 3.5 3.5 - 5.1 mmol/L   Chloride 103 101 - 111 mmol/L   CO2 22 22  - 32 mmol/L   Glucose, Bld 144 (H) 65 - 99 mg/dL   BUN 12 6 - 20 mg/dL   Creatinine, Ser 0.63 0.61 - 1.24 mg/dL   Calcium 6.9 (L) 8.9 - 10.3 mg/dL   Total Protein 5.9 (L) 6.5 - 8.1 g/dL   Albumin 3.1 (L) 3.5 - 5.0 g/dL   AST 30 15 - 41 U/L   ALT 41 17 - 63 U/L   Alkaline Phosphatase 112 38 - 126 U/L   Total Bilirubin 2.2 (H) 0.3 - 1.2 mg/dL   GFR calc non Af Amer >60 >60 mL/min   GFR calc Af Amer >60 >60 mL/min    Comment: (NOTE) The eGFR has been calculated using the CKD EPI equation. This calculation has not been validated in all clinical situations. eGFR's persistently <60 mL/min signify possible Chronic Kidney Disease.    Anion gap 10 5 - 15  CBC with Differential     Status: Abnormal   Collection Time: 08/21/17  1:16 PM  Result Value Ref Range   WBC 0.1 (LL) 4.0 - 10.5 K/uL    Comment: REPEATED TO VERIFY CRITICAL RESULT CALLED TO, READ BACK BY AND VERIFIED WITH: RAND,K @ 1450 ON [id number] POTEAT,S    RBC 3.85 (L) 4.22 - 5.81 MIL/uL   Hemoglobin 9.8 (L) 13.0 - 17.0 g/dL  HCT 27.6 (L) 39.0 - 52.0 %   MCV 71.7 (L) 78.0 - 100.0 fL   MCH 25.5 (L) 26.0 - 34.0 pg   MCHC 35.5 30.0 - 36.0 g/dL   RDW 17.4 (H) 11.5 - 15.5 %   Platelets 76 (L) 150 - 400 K/uL    Comment: REPEATED TO VERIFY SPECIMEN CHECKED FOR CLOTS PLATELET COUNT CONFIRMED BY SMEAR    Neutrophils Relative % 8 %   Lymphocytes Relative 34 %   Monocytes Relative 58 %   Eosinophils Relative 0 %   Basophils Relative 0 %   RBC Morphology POLYCHROMASIA PRESENT    Neutro Abs 0.0 (L) 1.7 - 7.7 K/uL   Lymphs Abs 0.0 (L) 0.7 - 4.0 K/uL   Monocytes Absolute 0.1 0.1 - 1.0 K/uL   Eosinophils Absolute 0.0 0.0 - 0.7 K/uL   Basophils Absolute 0.0 0.0 - 0.1 K/uL  Protime-INR     Status: Abnormal   Collection Time: 08/21/17  1:16 PM  Result Value Ref Range   Prothrombin Time 15.4 (H) 11.4 - 15.2 seconds   INR 1.23   CK     Status: None   Collection Time: 08/21/17  1:16 PM  Result Value Ref Range   Total CK 65 49 -  397 U/L  Phosphorus     Status: Abnormal   Collection Time: 08/21/17  1:16 PM  Result Value Ref Range   Phosphorus 1.9 (L) 2.5 - 4.6 mg/dL  I-Stat CG4 Lactic Acid, ED     Status: Abnormal   Collection Time: 08/21/17  1:30 PM  Result Value Ref Range   Lactic Acid, Venous 2.06 (HH) 0.5 - 1.9 mmol/L   Comment NOTIFIED PHYSICIAN   Urinalysis, Routine w reflex microscopic     Status: Abnormal   Collection Time: 08/21/17  2:50 PM  Result Value Ref Range   Color, Urine AMBER (A) YELLOW    Comment: BIOCHEMICALS MAY BE AFFECTED BY COLOR   APPearance CLOUDY (A) CLEAR   Specific Gravity, Urine 1.021 1.005 - 1.030   pH 7.0 5.0 - 8.0   Glucose, UA NEGATIVE NEGATIVE mg/dL   Hgb urine dipstick LARGE (A) NEGATIVE   Bilirubin Urine SMALL (A) NEGATIVE   Ketones, ur 20 (A) NEGATIVE mg/dL   Protein, ur 100 (A) NEGATIVE mg/dL   Nitrite NEGATIVE NEGATIVE   Leukocytes, UA TRACE (A) NEGATIVE   RBC / HPF TOO NUMEROUS TO COUNT 0 - 5 RBC/hpf   WBC, UA 6-30 0 - 5 WBC/hpf   Bacteria, UA MANY (A) NONE SEEN   Squamous Epithelial / LPF 0-5 (A) NONE SEEN   Mucus PRESENT    Hyaline Casts, UA PRESENT   Lactic acid, plasma     Status: Abnormal   Collection Time: 08/21/17  4:55 PM  Result Value Ref Range   Lactic Acid, Venous 2.6 (HH) 0.5 - 1.9 mmol/L    Comment: CRITICAL RESULT CALLED TO, READ BACK BY AND VERIFIED WITH: S.EARLY RN AT 1752 ON 08/21/17 BY S.VANHOORNE MLS     Dg Chest 2 View  Result Date: 08/21/2017 CLINICAL DATA:  Dizzy in short of breath EXAM: CHEST  2 VIEW COMPARISON:  06/28/2017 FINDINGS: Right jugular Port-A-Cath placed with its tip at the cavoatrial junction. No pneumothorax. Lungs are hyperaerated and clear. Normal heart size. No pleural effusion. IMPRESSION: No active cardiopulmonary disease. Electronically Signed   By: Marybelle Killings M.D.   On: 08/21/2017 13:50   Dg Thoracic Spine 2 View  Result Date: 08/21/2017 CLINICAL DATA:  Pain. History of lower B-cell non-Hodgkin's lymphoma EXAM:  THORACIC SPINE 3 VIEWS COMPARISON:  MR thoracic spine June 28, 2017 and chest radiograph August 21, 2017 FINDINGS: Frontal, lateral, and swimmer's views obtained. There is no evident acute fracture or spondylolisthesis. The pedicle at T12 on the right is no longer apparent and is presumably destroyed by neoplasm. This finding was evident on prior MR. there is subtle loss of detail along the lateral left T8 pedicle, noted by prior MR to be involved with tumor. There is no obvious neoplastic involvement vertebral bodies by radiography. MR shows tumor involving several vertebral bodies. IMPRESSION: No fracture or spondylolisthesis. There are multiple metastatic foci seen in the thoracic spine performed three months prior. By radiography, there is loss of the pedicle of T12 on the right and subtle loss of the lateral aspect of the pedicle of T8 on the left. 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 No other neoplastic foci identified by radiography the bony structures. Note that there are multiple foci of tumor and thoracic vertebral bodies seen on recent MR not seen by radiography currently. Electronically Signed   By: Lowella Grip III M.D.   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 Electronically Signed   By: Lowella Grip III M.D.   On: 08/21/2017 16:02   Result Date: 08/21/2017 CLINICAL DATA:  Pain. History of large B-cell non-Hodgkin's lymphoma EXAM: LUMBAR SPINE - COMPLETE 4+ VIEW COMPARISON:  Lumbar MRI June 28, 2017 FINDINGS: Frontal, lateral, spot lumbosacral lateral, and bilateral oblique views were obtained. There are 5 non-rib-bearing lumbar type vertebral bodies. There is a stable fracture of the L3 vertebral body, more severe along the leftward aspect. There is lucency in this  area suggesting metastatic focus as likely etiology for the fracture. There is apparent destruction in the region of the pedicle on the left at L3. No other fracture evident. No spondylolisthesis. Disc spaces appear normal. No other lytic or destructive lesions. There is a probable bone island medial upper right iliac crest. There is facet osteoarthritic change at L4-5 and L5-S1 bilaterally. IMPRESSION: There is again noted fracture L3 vertebral body. Tumor extends into and involves the left L3 pedicle. This finding was present on prior lumbar MR. No new bone lesions are evident. No new fracture. No spondylolisthesis. There is facet osteoarthritic change at L4-5 L5-S1 bilaterally. Electronically Signed: By: Lowella Grip III M.D. On: 08/21/2017 15:52    Review of Systems  Constitutional: Positive for chills, diaphoresis, fever and malaise/fatigue.  HENT: Negative.   Eyes: Negative.   Respiratory: Negative.  Negative for wheezing.   Cardiovascular: Negative.   Gastrointestinal: Positive for abdominal pain and diarrhea.  Genitourinary: Positive for hematuria and urgency.  Musculoskeletal: Negative.   Skin: Negative.   Neurological: Positive for weakness.  Endo/Heme/Allergies: Negative.   Psychiatric/Behavioral: Negative.    Blood pressure (!) 64/18, pulse (!) 128, temperature 98.2 F (36.8 C), temperature source Oral, resp. rate (!) 37, height _0  (1.88 m), weight 76.6 kg (168 lb 14 oz), SpO2 99 %. Physical Exam  Constitutional: He appears well-developed.  Febrile with rigors in ICU.   HENT:  Head: Normocephalic.  Eyes: Pupils are equal, round, and reactive to light.  Neck: Normal range of motion.  Cardiovascular:  Regular tachycardia by bedside monitor.  Rt port accessed.   Respiratory: Effort normal.  GI: Soft.  Genitourinary: Penis normal.  Musculoskeletal: Normal range of motion.  Neurological: He is alert.  Skin: Skin is warm.  Psychiatric: He has a normal mood and affect.     Assessment/Plan:  1 - Gross Hematuria - likely from Rt ureteral stone + UTI.   Given his clinical picture of neutropenic fevers / sepsis I feel urgent right ureteral decompression warranted as infected / obstructed stone may be course / part of source. Explained to pt this will not treat stone, but rather relive obstruction and help allow infectious paramaters to clear followed by definitive stone treatment likely with ureteroscopy in elective setting.   2 - Right Ureteral + Renal Stones - plan as per above.   3 - Bacteruria - agree with current ABX pending further CX data. This may or may not be source of neutropenic fever.   4 - Prostate Screening - up to date this year.   Teresa Nicodemus 08/21/2017, 6:25 PM

## 2017-08-21 NOTE — H&P (Addendum)
History and Physical    Cory Ibarra MWU:[id number] DOB: 1961/10/02  DOA: 08/21/2017 PCP: [name], [name]  Patient coming from: Home   Chief Complaint: Diarrhea and blood in urine  HPI: Cory Ibarra is a 56 y.o. 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 Presented to the emergency department complaining of new onset diarrhea w/o melena or hematochezia, low-grade fever nausea and blood in his urine. In the emergency department was found to be pancytopenic afebrile and passing urine and blood clots. Other associated symptoms include short of breath, cough. But denies chest pain and dizziness.   6:15 PM Patient subsequently became hypotensive, 3L of LR were given with minimal to no response. Patient was transferred to ICU and started on pressors.   Review of Systems:   General: no changes in body weight, positive for chill and fever   HEENT: no blurry vision, hearing changes or sore throat Respiratory: No wheezing. Positive for cough and dyspnea  CV: no chest pain, no palpitations. Port on R chest  GI: See HPI  GU: no dysuria or increased urinary frequency Ext:. No deformities,  Neuro: no unilateral weakness, numbness, or tingling, no vision change or hearing loss Skin: No rashes, lesions or wounds. MSK: No muscle spasm, no deformity, no limitation of range of movement in spin Heme: No easy bruising.  Travel history: No recent long distant travel.   Past Medical History:  Diagnosis Date  . Cancer ([place])   . Tendonitis     Past Surgical History:  Procedure Laterality Date  . HERNIA REPAIR  1988   Dr. Vida Rigger  . IR FLUORO GUIDE PORT INSERTION RIGHT  07/26/2017  . IR US GUIDE VASC ACCESS RIGHT  07/26/2017     reports that he has never smoked. He has never used smokeless tobacco. He reports that he drinks alcohol. He reports that he does not use drugs.  Allergies  Allergen  Reactions  . Bee Venom Swelling    Family History  Problem Relation Age of Onset  . Cancer Father     Prior to Admission medications   Medication Sig Start Date End Date Taking? Authorizing Provider  allopurinol (ZYLOPRIM) 300 MG tablet Take 1 tablet (300 mg total) by mouth daily. 07/16/17 08/21/17 Yes Perlov, Marinell Blight, MD  dexamethasone (DECADRON) 4 MG tablet Take 4 mg by mouth every other day.    Yes [provider]  lidocaine-prilocaine (EMLA) cream Apply to affected area once 07/24/17  Yes Perlov, Marinell Blight, MD  LORazepam (ATIVAN) 0.5 MG tablet Take 1 tablet (0.5 mg total) by mouth every 6 (six) hours as needed (Nausea or vomiting). 07/24/17  Yes Perlov, Marinell Blight, MD  oxyCODONE (OXY IR/ROXICODONE) 5 MG immediate release tablet Take 1 tablet (5 mg total) by mouth every 4 (four) hours as needed for moderate pain or severe pain. 08/13/17 09/03/17 Yes Hayden Pedro, PA-C  pantoprazole (PROTONIX) 40 MG tablet Take 1 tablet (40 mg total) by mouth daily at 12 noon. 08/13/17  Yes Hayden Pedro, PA-C  polyethylene glycol [facility] / Floria Raveling) packet Take 17 g by mouth daily as needed for mild constipation. 07/03/17  Yes Domenic Polite, MD  ondansetron (ZOFRAN) 8 MG tablet Take 1 tablet (8 mg total) by mouth 2 (two) times daily as needed for refractory nausea / vomiting. Start on day 3 after cyclophosphamide chemotherapy. Patient not taking: Reported on 08/21/2017 07/24/17   Ardath Sax, MD  Physical Exam: Vitals:   08/21/17 1439 08/21/17 1441 08/21/17 1524 08/21/17 1643  BP: 104/68 92/64 106/85   Pulse: (!) 104 (!) 103  (!) 145  Resp: (!) 21 (!) 22 (!) 21 (!) 22  Temp:  98.2 F (36.8 C)    TempSrc:  Oral    SpO2: 100% 100%  99%  Weight:  81.6 kg (180 lb)  76.6 kg (168 lb 14 oz)  Height:  6\' 2"  (1.88 m)  6\' 2"  (1.88 m)     Constitutional: Mild distress, with tachypnea, speech pressure  Eyes: PERRL, lids and conjunctivae normal ENMT: Mucous membranes are  dry. Posterior pharynx clear of any exudate or lesions. Neck: normal, supple, no masses, no thyromegaly Respiratory: clear to auscultation bilaterally, no wheezing, no crackles. Tachypnea.   Cardiovascular: Tachycardia @ 120's, S1S2 no murmurs or rubs, No JVD, No pedal edema  Abdomen: no tenderness, no masses palpated. No hepatosplenomegaly. Bowel sounds positive.  Musculoskeletal: no clubbing / cyanosis. No joint deformity upper and lower extremities. Good ROM Skin: no rashes, lesions, ulcers. No induration Neurologic: CN 2-12 grossly intact.  Psychiatric: Normal judgment and insight. Alert and oriented x 3. Normal mood.    Labs on Admission: I have personally reviewed following labs and imaging studies  CBC:  Recent Labs Lab 08/16/17 0924 08/21/17 1316  WBC 2.7* 0.1*  NEUTROABS 2.6 0.0*  HGB 11.0* 9.8*  HCT 32.3* 27.6*  MCV 73.9* 71.7*  PLT 158 76*   Basic Metabolic Panel:  Recent Labs Lab 08/16/17 0924 08/16/17 0924 08/21/17 1316  NA 135*  --  135  K 3.6  --  3.5  CL  --   --  103  CO2 25  --  22  GLUCOSE 83  --  144*  BUN 10.4  --  12  CREATININE 0.6*  --  0.63  CALCIUM 8.1*  --  6.9*  MG 2.3  --   --   PHOS  --  1.2*  --    GFR: Estimated Creatinine Clearance: 109 mL/min (by C-G formula based on SCr of 0.63 mg/dL). Liver Function Tests:  Recent Labs Lab 08/16/17 0924 08/21/17 1316  AST 27 30  ALT 59* 41  ALKPHOS 110 112  BILITOT 0.89 2.2*  PROT 6.4 5.9*  ALBUMIN 3.4* 3.1*   No results for input(s): LIPASE, AMYLASE in the last 168 hours. No results for input(s): AMMONIA in the last 168 hours. Coagulation Profile:  Recent Labs Lab 08/21/17 1316  INR 1.23   Cardiac Enzymes:  Recent Labs Lab 08/21/17 1316  CKTOTAL 65   BNP (last 3 results) No results for input(s): PROBNP in the last 8760 hours. HbA1C: No results for input(s): HGBA1C in the last 72 hours. CBG: No results for input(s): GLUCAP in the last 168 hours. Lipid Profile: No  results for input(s): CHOL, HDL, LDLCALC, TRIG, CHOLHDL, LDLDIRECT in the last 72 hours. Thyroid Function Tests: No results for input(s): TSH, T4TOTAL, FREET4, T3FREE, THYROIDAB in the last 72 hours. Anemia Panel: No results for input(s): VITAMINB12, FOLATE, FERRITIN, TIBC, IRON, RETICCTPCT in the last 72 hours. Urine analysis:    Component Value Date/Time   COLORURINE AMBER (A) 08/21/2017 1450   APPEARANCEUR CLOUDY (A) 08/21/2017 1450   LABSPEC 1.021 08/21/2017 1450   PHURINE 7.0 08/21/2017 1450   GLUCOSEU NEGATIVE 08/21/2017 1450   HGBUR LARGE (A) 08/21/2017 1450   BILIRUBINUR SMALL (A) 08/21/2017 1450   KETONESUR 20 (A) 08/21/2017 1450   PROTEINUR 100 (A) 08/21/2017 1450  UROBILINOGEN 1.0 07/06/2017 1029   NITRITE NEGATIVE 08/21/2017 1450   LEUKOCYTESUR TRACE (A) 08/21/2017 1450   Sepsis Labs: !!!!!!!!!!!!!!!!!!!!!!!!!!!!!!!!!!!!!!!!!!!! @LABRCNTIP (procalcitonin:4,lacticidven:4) )No results found for this or any previous visit (from the past 240 hour(s)).   Radiological Exams on Admission: Dg Chest 2 View  Result Date: 08/21/2017 CLINICAL DATA:  Dizzy in short of breath EXAM: CHEST  2 VIEW COMPARISON:  06/28/2017 FINDINGS: Right jugular Port-A-Cath placed with its tip at the cavoatrial junction. No pneumothorax. Lungs are hyperaerated and clear. Normal heart size. No pleural effusion. IMPRESSION: No active cardiopulmonary disease. Electronically Signed   By: Marybelle Killings M.D.   On: 08/21/2017 13:50   Dg Thoracic Spine 2 View  Result Date: 08/21/2017 CLINICAL DATA:  Pain. History of lower B-cell non-Hodgkin's lymphoma EXAM: THORACIC SPINE 3 VIEWS COMPARISON:  MR thoracic spine June 28, 2017 and chest radiograph August 21, 2017 FINDINGS: Frontal, lateral, and swimmer's views obtained. There is no evident acute fracture or spondylolisthesis. The pedicle at T12 on the right is no longer apparent and is presumably destroyed by neoplasm. This finding was evident on prior MR. there is  subtle loss of detail along the lateral left T8 pedicle, noted by prior MR to be involved with tumor. There is no obvious neoplastic involvement vertebral bodies by radiography. MR shows tumor involving several vertebral bodies. IMPRESSION: No fracture or spondylolisthesis. There are multiple metastatic foci seen in the thoracic spine performed three months prior. By radiography, there is loss of the pedicle of T12 on the right and subtle loss of the lateral aspect of the pedicle of T8 on the left. 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 No other neoplastic foci identified by radiography the bony structures. Note that there are multiple foci of tumor and thoracic vertebral bodies seen on recent MR not seen by radiography currently. Electronically Signed   By: Lowella Grip III M.D.   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 Electronically Signed   By: Lowella Grip III M.D.   On: 08/21/2017 16:02   Result Date: 08/21/2017 CLINICAL DATA:  Pain. History of large B-cell non-Hodgkin's lymphoma EXAM: LUMBAR SPINE - COMPLETE 4+ VIEW COMPARISON:  Lumbar MRI June 28, 2017 FINDINGS: Frontal, lateral, spot lumbosacral lateral, and bilateral oblique views were obtained. There are 5 non-rib-bearing lumbar type vertebral bodies. There is a stable fracture of the L3 vertebral body, more severe along the leftward aspect. There is lucency in this area suggesting metastatic focus as likely etiology for the fracture. There is apparent destruction in the region of the pedicle on the left at L3. No other fracture evident. No spondylolisthesis. Disc spaces appear normal. No other lytic or destructive lesions. There is a probable bone island medial upper right iliac crest. There is facet osteoarthritic change at  L4-5 and L5-S1 bilaterally. IMPRESSION: There is again noted fracture L3 vertebral body. Tumor extends into and involves the left L3 pedicle. This finding was present on prior lumbar MR. No new bone lesions are evident. No new fracture. No spondylolisthesis. There is facet osteoarthritic change at L4-5 L5-S1 bilaterally. Electronically Signed: By: Lowella Grip III M.D. On: 08/21/2017 15:52    EKG: Ordered   Assessment/Plan Febrile neutropenia  Admit to step down  Agree with empiric abx - continue Zosyn and Vanc  Check GI panel and C-diff  Neutropenic precautions  Neupogen started per oncology recommendations   Septic shock - vs hemorrhagic  Continue abx as above  Continue IVF  Will start levophed - PCCM consult  Stress dose steroids, hydrocortisone  Will get random cortisol level  Check CBC stat  Pancytopenia  2/2 chemo Oncology consulted  Started on Neupogen Check labs in AM  Transfuse if Hgb < 7   Hematuria  Patient with Hx of kidney stones note on CT scan from 07/30/17 Urology consulted CT hematuria work up   DVT prophylaxis: SCD's  Code Status: FULL  Family Communication: None at bedside  Disposition Plan: Anticipate discharge to previous home environment.  Consults called: PCCM, Urology  Admission status: ICU/Inpatient   Critical time spent: 75 minutes   Chipper Oman MD [facility] Pager: Text Page via [email or web address]  [phone]  If 7PM-7AM, please contact night-coverage [email or web address] Password [facility]  08/21/2017, 4:46 PM

## 2017-08-21 NOTE — ED Notes (Signed)
Abnormal lab result MD Allen have been made aware 

## 2017-08-21 NOTE — Progress Notes (Signed)
A consult was received from an ED physician for vancomycin and zosyn per pharmacy dosing.  The patient's profile has been reviewed for ht/wt/allergies/indication/available labs.   A one time order has been placed for vancomycin 1500mg  and zosyn 3.375.  Further antibiotics/pharmacy consults should be ordered by admitting physician if indicated.                       Thank you,  [name], PharmD Pager: [phone] 08/21/2017

## 2017-08-21 NOTE — Consult Note (Signed)
IP PROGRESS NOTE Requesting provider: Dr Doreatha Lew  Subjective:  Cory Ibarra is a 56 year old male with diagnosis of extensive diffuse large B-cell lymphoma, recently having received the initial cycle of systemic chemotherapy with R-CHOP approximately 2 weeks ago. Currently admitted due to presentation with nausea, vomiting, new onset diarrhea and fever at home.  In the emergency room found to be pancytopenic and febrile. Currently admitted to the stepdown unit. Complaining of nausea, dry heaves, stomach upset, diarrhea without melena or hematochezia. Also complains of hematuria or passage of clots.  He denies headache, confusion, does feel a bit short of breath, but no chest pain or cough.  Objective: Vital signs in last 24 hours: Blood pressure (!) 68/39, pulse (!) 139, temperature 98.2 F (36.8 C), temperature source Oral, resp. rate (!) 31, height 6\' 2"  (1.88 m), weight 168 lb 14 oz (76.6 kg), SpO2 100 %.  Intake/Output from previous day: No intake/output data recorded.  Physical Exam: Gen: Supplementation appears to be in acute distress, tachypnea,. Oriented 3, speech a bit pressured, and not his usual pattern of behavior. HEENT: Unremarkable Lungs: clear to auscultation anteriorly bilaterally. Cardiac: rapid, regular, no murmurs Abdomen: likely distended, soft, nontender. Extremities: peripheral edema   Portacath/PICC-without erythema  Lab Results:  Recent Labs  08/21/17 1316  WBC 0.1*  HGB 9.8*  HCT 27.6*  PLT 76*    BMET  Recent Labs  08/21/17 1316  NA 135  K 3.5  CL 103  CO2 22  GLUCOSE 144*  BUN 12  CREATININE 0.63  CALCIUM 6.9*    Lab Results  Component Value Date   CEA1 0.7 06/28/2017    Studies/Results: Dg Chest 2 View  Result Date: 08/21/2017 CLINICAL DATA:  Dizzy in short of breath EXAM: CHEST  2 VIEW COMPARISON:  06/28/2017 FINDINGS: Right jugular Port-A-Cath placed with its tip at the cavoatrial junction. No pneumothorax. Lungs  are hyperaerated and clear. Normal heart size. No pleural effusion. IMPRESSION: No active cardiopulmonary disease. Electronically Signed   By: Marybelle Killings M.D.   On: 08/21/2017 13:50   Dg Thoracic Spine 2 View  Result Date: 08/21/2017 CLINICAL DATA:  Pain. History of lower B-cell non-Hodgkin's lymphoma EXAM: THORACIC SPINE 3 VIEWS COMPARISON:  MR thoracic spine June 28, 2017 and chest radiograph August 21, 2017 FINDINGS: Frontal, lateral, and swimmer's views obtained. There is no evident acute fracture or spondylolisthesis. The pedicle at T12 on the right is no longer apparent and is presumably destroyed by neoplasm. This finding was evident on prior MR. there is subtle loss of detail along the lateral left T8 pedicle, noted by prior MR to be involved with tumor. There is no obvious neoplastic involvement vertebral bodies by radiography. MR shows tumor involving several vertebral bodies. IMPRESSION: No fracture or spondylolisthesis. There are multiple metastatic foci seen in the thoracic spine performed three months prior. By radiography, there is loss of the pedicle of T12 on the right and subtle loss of the lateral aspect of the pedicle of T8 on the left. 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 No other neoplastic foci identified by radiography the bony structures. Note that there are multiple foci of tumor and thoracic vertebral bodies seen on recent MR not seen by radiography currently. Electronically Signed   By: Lowella Grip III M.D.   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 Electronically Signed   By: Lowella Grip III M.D.   On: 08/21/2017 16:02   Result Date: 08/21/2017 CLINICAL DATA:  Pain. History of large B-cell non-Hodgkin's lymphoma EXAM: LUMBAR SPINE - COMPLETE 4+  VIEW COMPARISON:  Lumbar MRI June 28, 2017 FINDINGS: Frontal, lateral, spot lumbosacral lateral, and bilateral oblique views were obtained. There are 5 non-rib-bearing lumbar type vertebral bodies. There is a stable fracture of the L3 vertebral body, more severe along the leftward aspect. There is lucency in this area suggesting metastatic focus as likely etiology for the fracture. There is apparent destruction in the region of the pedicle on the left at L3. No other fracture evident. No spondylolisthesis. Disc spaces appear normal. No other lytic or destructive lesions. There is a probable bone island medial upper right iliac crest. There is facet osteoarthritic change at L4-5 and L5-S1 bilaterally. IMPRESSION: There is again noted fracture L3 vertebral body. Tumor extends into and involves the left L3 pedicle. This finding was present on prior lumbar MR. No new bone lesions are evident. No new fracture. No spondylolisthesis. There is facet osteoarthritic change at L4-5 L5-S1 bilaterally. Electronically Signed: By: Lowella Grip III M.D. On: 08/21/2017 15:52    Medications: I have reviewed the patient's current medications.  Assessment/Plan: 56 year old male with diffuse large cell lymphoma undergoing systemic chemotherapy with [name], presenting with neutropenic fever and possible developing sepsis based on hypotension and tachycardia.Patient is on chronic steroids and relative adrenal sufficiency is likely contributing to his current hemodynamic instability. The source of infection is gastrointestinal, cannot exclude typhlitis although patient does not have tenderness or pain of the abdomen. Note that any inflammatory changes including on urinalysis or in imaging be attenuated due to paucity of neutrophils.  Recommendations: --Recommend ICU transfer at least for overnight as Patients like this may decompensate easily --Agree with stress dose steroids --Agree with IV fluids --Agree with choice of  antibiotics with vancomycin and piperacillin/tazobactam --Recommend starting Neupogen 480 g daily --Consider CT of abdomen and pelvis with contrast to evaluate for possible intra-abdominal abnormalities that may warrant change in therapy  I will continue following the patient daily. Please don't hesitate to contact me directly at [phone] hardened.   LOS: 0 days   Ardath Sax, MD   08/21/2017, 5:42 PM

## 2017-08-21 NOTE — Progress Notes (Signed)
[place] Progress Note Patient Name: Cory Ibarra DOB: Feb 19, 1961 MRN: [id number]   Date of Service  08/21/2017  HPI/Events of Note  Hypotension - BP = 84/55 with MAP = 62.   eICU Interventions  Will order: 1. Bolus with 0.9 NaCl 500 mL IV over 30 minutes now. 2. Titrate Norepinephrine IV infusion up to ceiling to achieve MAP >= 65.      Intervention Category Intermediate Interventions: Hypotension - evaluation and management  Sommer,Steven Eugene 08/21/2017, 9:12 PM

## 2017-08-21 NOTE — Telephone Encounter (Signed)
Not able to reach patient re coming in for lab. Scheduled patient for today at 3 pm and asked that patient call me back to confirm and/or come in earlier if possible.

## 2017-08-21 NOTE — Consult Note (Addendum)
PULMONARY / CRITICAL CARE MEDICINE   Name: Cory Ibarra MRN: [id number] DOB: [birth year]    ADMISSION DATE:  08/21/2017 CONSULTATION DATE:  08/21/2017  REFERRING MD:  Dr. Elon Jester  CHIEF COMPLAINT:  Hypotension  HISTORY OF PRESENT ILLNESS:   56 year old male with past medical history of newly diagnosed (late 06/2017) extensive diffuse B-cell lymphoma who presented on 9/18 with nausea, vomiting, hematuria, mild shortness of breath, and new onset fever and diarrhea at home.    Patient is followed by Dr. Grace Isaac with oncology.  Received his initial cycle of systemic chemotherapy with R-CHOP and Xgeva on 08/09/2017 and takes daily prednisone (every other day), acyclovir and allopurinol.  Some intolerance with taking allopurinol at home w/upsetting stomach per oncology notes.  Found in the ER to be pancytopenic, afebrile, and having hematuria with clots.  Of note, patient noted on CT abd 07/30/17 to have multiple right renal calculi  Including a 10 mm proximal right ureteral calculus without hydronephrosis.  Additionally, he became hypotensive, unresponsive to 3 liters of LR and started on norepinephrine.  He was empirically started on vancomycin and zosyn.  Patient admitted to [facility] and SDU, with oncology and urology consulting.   PCCM consulted for ongoing shock.    PAST MEDICAL HISTORY :  He  has a past medical history of Cancer ([place]) and Tendonitis.  PAST SURGICAL HISTORY: He  has a past surgical history that includes Hernia repair (1988); IR FLUORO GUIDE PORT INSERTION RIGHT (07/26/2017); and IR US Guide Vasc Access Right (07/26/2017).  Allergies  Allergen Reactions  . Bee Venom Swelling    Current Facility-Administered Medications on File Prior to Encounter  Medication  . sodium chloride flush (NS) 0.9 % injection 10 mL   Current Outpatient Prescriptions on File Prior to Encounter  Medication Sig  . allopurinol (ZYLOPRIM) 300 MG tablet Take 1 tablet (300 mg total) by mouth daily.  Marland Kitchen  dexamethasone (DECADRON) 4 MG tablet Take 4 mg by mouth every other day.   . lidocaine-prilocaine (EMLA) cream Apply to affected area once  . LORazepam (ATIVAN) 0.5 MG tablet Take 1 tablet (0.5 mg total) by mouth every 6 (six) hours as needed (Nausea or vomiting).  Marland Kitchen oxyCODONE (OXY IR/ROXICODONE) 5 MG immediate release tablet Take 1 tablet (5 mg total) by mouth every 4 (four) hours as needed for moderate pain or severe pain.  . pantoprazole (PROTONIX) 40 MG tablet Take 1 tablet (40 mg total) by mouth daily at 12 noon.  . polyethylene glycol (MIRALAX / GLYCOLAX) packet Take 17 g by mouth daily as needed for mild constipation.  . ondansetron (ZOFRAN) 8 MG tablet Take 1 tablet (8 mg total) by mouth 2 (two) times daily as needed for refractory nausea / vomiting. Start on day 3 after cyclophosphamide chemotherapy. (Patient not taking: Reported on 08/21/2017)    FAMILY HISTORY:  His indicated that his mother is deceased. He indicated that his father is deceased.    SOCIAL HISTORY: He  reports that he has never smoked. He has never used smokeless tobacco. He reports that he drinks alcohol. He reports that he does not use drugs.  REVIEW OF SYSTEMS:   All negative; except for those that are bolded, which indicate positives.  Constitutional: weight loss, weight gain, night sweats, fevers, chills, fatigue, weakness.  HEENT: headaches, sore throat, sneezing, nasal congestion, post nasal drip, difficulty swallowing, tooth/dental problems, visual complaints, visual changes, ear aches. Neuro: difficulty with speech, weakness, numbness, ataxia. CV:  chest pain, orthopnea, PND,  swelling in lower extremities, dizziness, palpitations, syncope.  Resp: cough, hemoptysis, dyspnea, wheezing. GI: heartburn, indigestion, abdominal pain, nausea, vomiting, diarrhea, constipation, change in bowel habits, loss of appetite, hematemesis, melena, hematochezia.  GU: dysuria, change in color of urine, urgency or frequency,  flank pain, hematuria. MSK: joint pain or swelling, decreased range of motion. Psych: change in mood or affect, depression, anxiety, suicidal ideations, homicidal ideations. Skin: rash, itching, bruising.  SUBJECTIVE:    VITAL SIGNS: BP 126/61 (BP Location: Left Arm)   Pulse (!) 146   Temp 98.2 F (36.8 C) (Oral)   Resp (!) 25   Ht 6\' 2"  (1.88 m)   Wt 168 lb 14 oz (76.6 kg)   SpO2 99%   BMI 21.68 kg/m   HEMODYNAMICS:    VENTILATOR SETTINGS:    INTAKE / OUTPUT: No intake/output data recorded.  PHYSICAL EXAMINATION: General:  Adult male, no distress  Neuro:  Alert, oriented, follows commands. No focal deficits HEENT:  Normocephalic dru mucous membranes in oropharynx Cardiovascular:  Sinus tachy  no MRG equal pulses.  Lungs:  Non-labored, no wheeze/crackles. Port noted in right chest wall Abdomen: soft scaphoid + BS Tender to Lower Quad, active bowel sounds  GU: denies flank pain and no appreciable CVA tenderness Musculoskeletal:  +2 pedal edema  Skin:  Warm, dry, intact   LABS:  BMET  Recent Labs Lab 08/16/17 0924 08/21/17 1316  NA 135* 135  K 3.6 3.5  CL  --  103  CO2 25 22  BUN 10.4 12  CREATININE 0.6* 0.63  GLUCOSE 83 144*    Electrolytes  Recent Labs Lab 08/16/17 0924 08/16/17 0924 08/21/17 1316  CALCIUM 8.1*  --  6.9*  MG 2.3  --   --   PHOS  --  1.2* 1.9*    CBC  Recent Labs Lab 08/16/17 0924 08/21/17 1316  WBC 2.7* 0.1*  HGB 11.0* 9.8*  HCT 32.3* 27.6*  PLT 158 76*    Coag's  Recent Labs Lab 08/21/17 1316  INR 1.23    Sepsis Markers  Recent Labs Lab 08/21/17 1330 08/21/17 1655  LATICACIDVEN 2.06* 2.6*    ABG No results for input(s): PHART, PCO2ART, PO2ART in the last 168 hours.  Liver Enzymes  Recent Labs Lab 08/16/17 0924 08/21/17 1316  AST 27 30  ALT 59* 41  ALKPHOS 110 112  BILITOT 0.89 2.2*  ALBUMIN 3.4* 3.1*    Cardiac Enzymes No results for input(s): TROPONINI, PROBNP in the last 168  hours.  Glucose No results for input(s): GLUCAP in the last 168 hours.  Imaging Dg Chest 2 View  Result Date: 08/21/2017 CLINICAL DATA:  Dizzy in short of breath EXAM: CHEST  2 VIEW COMPARISON:  06/28/2017 FINDINGS: Right jugular Port-A-Cath placed with its tip at the cavoatrial junction. No pneumothorax. Lungs are hyperaerated and clear. Normal heart size. No pleural effusion. IMPRESSION: No active cardiopulmonary disease. Electronically Signed   By: Marybelle Killings M.D.   On: 08/21/2017 13:50   Dg Thoracic Spine 2 View  Result Date: 08/21/2017 CLINICAL DATA:  Pain. History of lower B-cell non-Hodgkin's lymphoma EXAM: THORACIC SPINE 3 VIEWS COMPARISON:  MR thoracic spine June 28, 2017 and chest radiograph August 21, 2017 FINDINGS: Frontal, lateral, and swimmer's views obtained. There is no evident acute fracture or spondylolisthesis. The pedicle at T12 on the right is no longer apparent and is presumably destroyed by neoplasm. This finding was evident on prior MR. there is subtle loss of detail along the lateral left  T8 pedicle, noted by prior MR to be involved with tumor. There is no obvious neoplastic involvement vertebral bodies by radiography. MR shows tumor involving several vertebral bodies. IMPRESSION: No fracture or spondylolisthesis. There are multiple metastatic foci seen in the thoracic spine performed three months prior. By radiography, there is loss of the pedicle of T12 on the right and subtle loss of the lateral aspect of the pedicle of T8 on the left. 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 No other neoplastic foci identified by radiography the bony structures. Note that there are multiple foci of tumor and thoracic vertebral bodies seen on recent MR not seen by radiography currently. Electronically Signed   By: Lowella Grip III M.D.   On: 08/21/2017 16:00   Dg Lumbar Spine Complete  Addendum Date: 08/21/2017   ADDENDUM REPORT: 08/21/2017 16:02  ADDENDUM: There is also loss of the pedicle on the right at T12, indicative of bony metastatic disease in this area. Electronically Signed   By: Lowella Grip III M.D.   On: 08/21/2017 16:02   Result Date: 08/21/2017 CLINICAL DATA:  Pain. History of large B-cell non-Hodgkin's lymphoma EXAM: LUMBAR SPINE - COMPLETE 4+ VIEW COMPARISON:  Lumbar MRI June 28, 2017 FINDINGS: Frontal, lateral, spot lumbosacral lateral, and bilateral oblique views were obtained. There are 5 non-rib-bearing lumbar type vertebral bodies. There is a stable fracture of the L3 vertebral body, more severe along the leftward aspect. There is lucency in this area suggesting metastatic focus as likely etiology for the fracture. There is apparent destruction in the region of the pedicle on the left at L3. No other fracture evident. No spondylolisthesis. Disc spaces appear normal. No other lytic or destructive lesions. There is a probable bone island medial upper right iliac crest. There is facet osteoarthritic change at L4-5 and L5-S1 bilaterally. IMPRESSION: There is again noted fracture L3 vertebral body. Tumor extends into and involves the left L3 pedicle. This finding was present on prior lumbar MR. No new bone lesions are evident. No new fracture. No spondylolisthesis. There is facet osteoarthritic change at L4-5 L5-S1 bilaterally. Electronically Signed: By: Lowella Grip III M.D. On: 08/21/2017 15:52     STUDIES:  CXR 9/18 > No acute  T Spine XR 9/18 > No fracture or spondylolisthesis. There are multiple metastatic foci seen in the thoracic spine performed three months prior. By radiography, there is loss of the pedicle of T12 on the right and subtle loss of the lateral aspect of the pedicle of T8 on the left. 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 No other neoplastic foci identified by radiography the bony structures. Note that there are multiple foci of tumor and thoracic vertebral bodies seen on  recent MR not seen by radiography currently  CT ABDOMEN PELVIS 07/30/17 Hepatobiliary: Distended gallbladder.  Normal appearing liver. Pancreas: Unremarkable. No pancreatic ductal dilatation or surrounding inflammatory changes. Spleen: Normal in size without focal abnormality. Adrenals/Urinary Tract: 4 mm calculus in the mid right kidney andadjacent 5 mm calculi in the lower right kidney. Additional smallercalculus in the mid to upper right kidney. The right kidney remains smaller than the left kidney with no left renal calculi seen. There is a 10 mm proximal right ureteral calculus with no dilatation of the right renal collecting system. No left ureteral calculi or\ bladder calculi seen. No distal right ureteral calculi visualized. Multiple bilateral pelvic phleboliths are again demonstrated.Stomach/Bowel: Stomach is within normal limits. Appendix appears normal. No  evidence of bowel wall thickening, distention, or inflammatory changes. Vascular/Lymphatic: No vascular calcifications are aneurysm. The previously demonstrated 6.7 x 5.1 cm left para-aortic retroperitoneal lymph node mass is much smaller, currently measuring 3.1 x 1.7 cm on image number 81 series 2. A more inferior left para-aortic node previously measuring 3.6 x 3.3 cm currently measures 1.8 x 1.2 cm on image number 89 series 2. No new or enlarging lymph nodes are seen. Reproductive: Prostate is unremarkable. Other: Proximal left inguinal hernia containing fat. No free peritoneal fluid. NMusculoskeletal: The previously demonstrated left L3 vertebral destructive mass is significantly smaller with decreased size of the previously demonstrated component within the spinal canal. There is also mildly progressive collapse and sclerosis of the L3 vertebral Body. There has been an increase in size of a similar-appearing mass in anterior aspect of the right iliac bone, currently measuring 4.0 x 3.4 cm on image number 103 series 2. There is also been  increase in size of a similar-appearing mass in the right pubic body, currently measuring approximately 3.1 x 2.8 cm on image number 125 series 2, extending into the symphysis pubis.   CULTURES: Blood 9/18 >> Urine 9/18 >> GI pathogen 9/18 >>   ANTIBIOTICS: Zosyn 9/18 >> Vancomycin 9/18 >>   SIGNIFICANT EVENTS: 9/18 > Presents to ED   LINES/TUBES: Right Chest Port   DISCUSSION: 56 year old male with B-Cell lymphoma presents to ED 9/18 with N/V hematuria, diarrhea for the last 24 hours. CT A/P 8/17 with multiple right renal calculi. Urology consulted and will take to IR for stent placement. Currently on Levophed gtt.   ASSESSMENT / PLAN:  PULMONARY A: At risk for respiratory Insufficiency  P:   Monitor  Pulmonary Hygiene   CARDIOVASCULAR A:  Shock- likely uroseptic related to obstructive renal calculi  P:  Cardiac Monitoring  Levophed for MAP > 65 Stress Dose Steroids   RENAL A:   Urosepsis ?  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 Attempted to call brothers. Patient states that he has a living will is okay with short term intubation however does not wish to undergo CPR.   - Inter-disciplinary family meet or Palliative Care meeting due by:  08/28/2017  CC Time: 21 minutes   Hayden Pedro, AGACNP-BC [facility]  Pgr: [phone]  PCCM Pgr: [phone]  STAFF NOTE  I, Dr Seward Carol have  personally reviewed patient's available data, including medical history, events of note, physical examination and test results as part of my evaluation. I have discussed with resident/NP and other care providers such as pharmacist, RN and RRT.  In addition,  I personally evaluated patient  56 yr old male with PMHx of metastatic DLBCL s/p recent CHOP therapy on 08/09/17 presented with hematuria and severe neutropenia septic shock currently on levophed suspected source GU from nephrolithiasis possible obstructing stone reviewed previous CT Abdomen/Pelvis with multiple stones including a 50mm stone noted. Urology on board and plan for Cystoscopy with Retrograde Pyelogram/ Ureteral Stent Placement.   PULM:  At risk for atelectasis and for respiratory insufficiency Discussed with patient and he is amenable to intubation for a short period of time for a reversible reason. Secretion mgmt,Chest PT/suctioning/mucolytics and supplemental O2 Sat>92% goal  CARD: hypotensive-> Septic Shock MAP goal>45mm Hg Continue Levophed Started on stress dose steriods with hydrocortisone Prev on Decadron per chart Last EF 50-55% Mild LVH, mild pulmonic and tricuspid regurgitation Abnormal septal wall motion with inferior basal hypokinesis noted Trend Lactic acid.   NEURO: Alert and awake during my exam with intact neuro exam If mental status changes given malignancy may require CTH/MRI Analgesia- oxycodone prn ok now that MAP improved with pressors  ID: Septic shock Blood cultures, urine cultures and  GI pathogen panel sent PCT may not be accurate in this patient s/p chemotherapy and immunosuppression Continue with broad spectrum antibiotics Vancomycin and Zosyn Severe Neutropenia - low ANC Covered with antipseudomonal ( Zosyn) Neutropenic precautions MRSA+ nasal swab-> contact precautions Bactroban BID x 5 days in both nares  GI: Diarrhea F/u GI pathogen panel  F/u electrolytes NPO for now  PPI- noted thrombocytopenia if plts drop may stop this Zofran for nausea  RENAL: For Cystoscopy with Retrograde Pyelogram/ Ureteral Stent Placement. 71mm stone and multiple others on August CT scan No hydronephrosis noted  Creatinine 0.63  Continue IVF Continue Abx Will f/u post procedure for urology recommendations  HEME/ONC: Metastatic DLBCL s/p CHOP on 9/6 Severe neutropenia Thrombocytopenic Drop in counts may be attributed to chemotherapy Or to infection. Covered with empiric antibiotics Trend WBC and Plts Transfuse Plts only if active bleeding or <20 and at risk for spontaneous bleeding Filagristim? Will discuss with Hematology/Oncology  ENDOCRINE: 140mg /dl On stress dose steroids ISS per ICU glycemic protocol  Rest per NP/medical resident whose note is outlined above and that I agree with The patient is critically ill with multiple organ systems failure and requires high complexity decision making for assessment and support, frequent evaluation and titration of therapies, application of advanced monitoring technologies and extensive interpretation of multiple databases.  Critical Care Time devoted to patient care services described in this note is 40 Minutes. This time reflects time of care of this signee Dr Seward Carol. This critical care time does not reflect procedure time, or teaching time or supervisory time of PA/NP/Med student/Med Resident etc but could involve care discussion time   Code Status: partial Disposition: ICU Consultants: Urology and Hem Onc Next  of Kin: 2 Brothers Critical Care Time: 40 mins  Dr. Seward Carol [facility]  08/21/2017 9:00 PM

## 2017-08-21 NOTE — ED Notes (Signed)
PATIENT WALKED FROM BED TO DOOR OF HIS ROOM AND THAT WAS WITH MAX ASSIST

## 2017-08-21 NOTE — Interval H&P Note (Signed)
History and Physical Interval Note:  08/21/2017 7:27 PM  Cory Ibarra  has presented today for surgery, with the diagnosis of sepsis, right ureteral stone  The various methods of treatment have been discussed with the patient and family. After consideration of risks, benefits and other options for treatment, the patient has consented to  Procedure(s): CYSTOSCOPY WITH RETROGRADE PYELOGRAM/URETERAL STENT PLACEMENT (Right) as a surgical intervention .  The patient's history has been reviewed, patient examined, no change in status, stable for surgery.  I have reviewed the patient's chart and labs.  Questions were answered to the patient's satisfaction.     Alcide Memoli

## 2017-08-21 NOTE — ED Notes (Signed)
Patient transported to X-ray 

## 2017-08-21 NOTE — Progress Notes (Signed)
Attempt at retrograde stent UNsuccessfull due to severe impaction. Discussed with pt's brother Gershon Mussel who is his POA / next of kin at [phone] and recommended attempt right nephrostomy tube placement. He is in agreement. Discussed with IR team and they will attempt. Reinforced to Gershon Mussel that his brother is critically ill with life threatening infection. He voiced appreciation for care teams involved.

## 2017-08-21 NOTE — ED Provider Notes (Addendum)
[facility] DEPT Provider Note   CSN: [id number] Arrival date & time: 08/21/17  1204     History   Chief Complaint Chief Complaint  Patient presents with  . Fatigue  . Dizziness  . Shortness of Breath    HPI Cory Ibarra is a 56 y.o. male.  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 Pt also is on chemo which was just initiated.Marland Kitchen He reports that he woke up this morning and noted that he had defecated on himself. He went to the bathroom and had 3 more BM. Pt reports that he was aware that he had to go defecate later in the day, and he has had about 5 total loose BM today. Pt also has nausea and emesis x 3. ROS is also + for dizziness and blood in the urine.   Past Medical History:  Diagnosis Date  . Cancer ([place])   . Tendonitis     Patient Active Problem List   Diagnosis Date Noted  . Hypophosphatemia 08/20/2017  . Diffuse large B cell lymphoma ([place]) 07/03/2017  . Bony metastasis ([place]) 06/29/2017  . Chest wall mass   . 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 Closed compression fracture of L3 lumbar vertebra ([place]) 06/28/2017  . Severe back pain 06/28/2017  . Weight loss, unintentional 06/28/2017    Past Surgical History:  Procedure Laterality Date  . HERNIA REPAIR  1988   Dr. Vida Rigger  . IR FLUORO GUIDE PORT INSERTION RIGHT  07/26/2017  . IR US GUIDE VASC ACCESS RIGHT  07/26/2017       Home Medications    Prior to Admission medications   Medication Sig Start Date End Date Taking? Authorizing Provider  allopurinol (ZYLOPRIM) 300 MG tablet Take 1 tablet (300 mg total) by mouth daily. 07/16/17 08/21/17 Yes Perlov, Marinell Blight, MD  dexamethasone (DECADRON) 4 MG tablet Take 4 mg by mouth every other day.    Yes [provider]  lidocaine-prilocaine (EMLA) cream Apply to affected area once 07/24/17  Yes Perlov, Marinell Blight, MD  LORazepam (ATIVAN) 0.5 MG tablet Take 1 tablet (0.5 mg total) by mouth every 6 (six) hours as needed  (Nausea or vomiting). 07/24/17  Yes Perlov, Marinell Blight, MD  oxyCODONE (OXY IR/ROXICODONE) 5 MG immediate release tablet Take 1 tablet (5 mg total) by mouth every 4 (four) hours as needed for moderate pain or severe pain. 08/13/17 09/03/17 Yes Hayden Pedro, PA-C  pantoprazole (PROTONIX) 40 MG tablet Take 1 tablet (40 mg total) by mouth daily at 12 noon. 08/13/17  Yes Hayden Pedro, PA-C  polyethylene glycol [facility] / Floria Raveling) packet Take 17 g by mouth daily as needed for mild constipation. 07/03/17  Yes Domenic Polite, MD  ondansetron (ZOFRAN) 8 MG tablet Take 1 tablet (8 mg total) by mouth 2 (two) times daily as needed for refractory nausea / vomiting. Start on day 3 after cyclophosphamide chemotherapy. Patient not taking: Reported on 08/21/2017 07/24/17   Ardath Sax, MD    Family History Family History  Problem Relation Age of Onset  . Cancer Father     Social History Social History  Substance Use Topics  . Smoking status: Never Smoker  . Smokeless tobacco: Never Used  . Alcohol use Yes     Comment: occasionally/socially     Allergies   Bee venom   Review of Systems Review of Systems  Constitutional: Positive for activity change.  Gastrointestinal: Positive for diarrhea  and nausea.  Genitourinary: Positive for hematuria.  Allergic/Immunologic: Positive for immunocompromised state.  All other systems reviewed and are negative.    Physical Exam Updated Vital Signs BP 92/64 (BP Location: Right Arm)   Pulse (!) 103   Temp 98.2 F (36.8 C) (Oral)   Resp (!) 22   Ht 6\' 2"  (1.88 m)   Wt 81.6 kg (180 lb)   SpO2 100%   BMI 23.11 kg/m   Physical Exam  Constitutional: He is oriented to person, place, and time. He appears well-developed.  HENT:  Head: Atraumatic.  Neck: Neck supple.  Cardiovascular: Normal rate.   Pulmonary/Chest: Effort normal.  Neurological: He is alert and oriented to person, place, and time.  Bilateral lower extremity weakness  (3/5) Gross sensory exam is equal. Pt has no no hyperreflexia - reflex is 1/5 over the patellar region No saddle anesthesia Intact rectal tone  Skin: Skin is warm.  Nursing note and vitals reviewed.    ED Treatments / Results  Labs (all labs ordered are listed, but only abnormal results are displayed) Labs Reviewed  COMPREHENSIVE METABOLIC PANEL - Abnormal; Notable for the following:       Result Value   Glucose, Bld 144 (*)    Calcium 6.9 (*)    Total Protein 5.9 (*)    Albumin 3.1 (*)    Total Bilirubin 2.2 (*)    All other components within normal limits  CBC WITH DIFFERENTIAL/PLATELET - Abnormal; Notable for the following:    WBC 0.1 (*)    RBC 3.85 (*)    Hemoglobin 9.8 (*)    HCT 27.6 (*)    MCV 71.7 (*)    MCH 25.5 (*)    RDW 17.4 (*)    Platelets 76 (*)    Neutro Abs 0.0 (*)    Lymphs Abs 0.0 (*)    All other components within normal limits  PROTIME-INR - Abnormal; Notable for the following:    Prothrombin Time 15.4 (*)    All other components within normal limits  I-STAT CG4 LACTIC ACID, ED - Abnormal; Notable for the following:    Lactic Acid, Venous 2.06 (*)    All other components within normal limits  CULTURE, BLOOD (ROUTINE X 2)  CULTURE, BLOOD (ROUTINE X 2)  URINE CULTURE  GASTROINTESTINAL PANEL BY PCR, STOOL (REPLACES STOOL CULTURE)  CK  URINALYSIS, ROUTINE W REFLEX MICROSCOPIC  I-STAT CG4 LACTIC ACID, ED    EKG  EKG Interpretation None      Date: 08/21/2017  Rate: 107  Rhythm:  sinus rhythm, tachycaridia  QRS Axis: normal  Intervals: normal  ST/T Wave abnormalities: normal  Conduction Disutrbances: none  Narrative Interpretation: unremarkable    Radiology Dg Chest 2 View  Result Date: 08/21/2017 CLINICAL DATA:  Dizzy in short of breath EXAM: CHEST  2 VIEW COMPARISON:  06/28/2017 FINDINGS: Right jugular Port-A-Cath placed with its tip at the cavoatrial junction. No pneumothorax. Lungs are hyperaerated and clear. Normal heart size. No  pleural effusion. IMPRESSION: No active cardiopulmonary disease. Electronically Signed   By: Marybelle Killings M.D.   On: 08/21/2017 13:50    Procedures Procedures (including critical care time) CRITICAL CARE Performed by: Varney Biles   Total critical care time: 48 minutes  Critical care time was exclusive of separately billable procedures and treating other patients.  Critical care was necessary to treat or prevent imminent or life-threatening deterioration.  Critical care was time spent personally by me on the following activities: development of  treatment plan with patient and/or surrogate as well as nursing, discussions with consultants, evaluation of patient's response to treatment, examination of patient, obtaining history from patient or surrogate, ordering and performing treatments and interventions, ordering and review of laboratory studies, ordering and review of radiographic studies, pulse oximetry and re-evaluation of patient's condition.   Medications Ordered in ED Medications  sodium chloride 0.9 % bolus 1,000 mL (0 mLs Intravenous Stopped 08/21/17 1502)    And  sodium chloride 0.9 % bolus 1,000 mL (0 mLs Intravenous Stopped 08/21/17 1502)    And  sodium chloride 0.9 % bolus 500 mL (not administered)  piperacillin-tazobactam (ZOSYN) IVPB 3.375 g (not administered)  vancomycin (VANCOCIN) IVPB 1000 mg/200 mL premix (not administered)     Initial Impression / Assessment and Plan / ED Course  I have reviewed the triage vital signs and the nursing notes.  Pertinent labs & imaging results that were available during my care of the patient were reviewed by me and considered in my medical decision making (see chart for details).     Pt comes in with cc of weakness, diarrhea, hematuria. Pt arrives hypotensive and tachycardic. ROS is + for diarrhea or + hematuria. No clear source of infection. Labs showed neutropenia and lactate is slightly elevated. We will start sepsis  workup.  Pt is noted to be weak in his legs. He categorically denies fecal incontinence, as he went to the bathroom and had diarrhea, I.e. He  Is able to sense that he has to go. He also has no urinary incontinence of retention and his rectal tone is intact and there is no saddle anesthesia. MRI from July reviewed. We will get screening xrays now, MRI STAT not indicated based on my gestalt.  Final Clinical Impressions(s) / ED Diagnoses   Final diagnoses:  Pancytopenia ([place])  Neutropenia, unspecified type ([place])  Shock circulatory ([place])  Lactic acidosis    New Prescriptions New Prescriptions   No medications on file     Varney Biles, MD 08/21/17 [place], [place], MD 08/21/17 1514

## 2017-08-21 NOTE — Progress Notes (Signed)
Pharmacy Antibiotic Note  Cory Ibarra is a 56 y.o. male admitted on 08/21/2017 with febrile neutropenia, possible developing sepsis with source of infection likely gastrointestinal.  PMH significant for extensive diffuse large B-cell lymphoma, recently received first cycle of R-CHOP approximately 2 weeks ago.  Pharmacy has been consulted for vancomycin and zosyn dosing.  Vancomycin 1500mg  and Zosyn 3.375 given in ED.  Granix daily started by oncology.  Does not appear to have received Neulasta following R-CHOP per chart review.  Should be near nadir.  Plan: Vancomycin 750 mg IV q8h. Zosyn 3.375g IV q8h (4 hour infusion time).  Daily SCr.  Height: 6\' 2"  (188 cm) Weight: 168 lb 14 oz (76.6 kg) IBW/kg (Calculated) : 82.2  Temp (24hrs), Avg:98.3 F (36.8 C), Min:98.2 F (36.8 C), Max:98.3 F (36.8 C)   Recent Labs Lab 08/16/17 0924 08/16/17 0924 08/21/17 1316 08/21/17 1330 08/21/17 1655  WBC 2.7*  --  0.1*  --   --   CREATININE  --  0.6* 0.63  --   --   LATICACIDVEN  --   --   --  2.06* 2.6*    Estimated Creatinine Clearance: 109 mL/min (by C-G formula based on SCr of 0.63 mg/dL).    Allergies  Allergen Reactions  . Bee Venom Swelling    Antimicrobials this admission: 9/18 Vancomycin >> 9/18 Zosyn >>  Dose adjustments this admission:  Microbiology results: 9/18 BCx:  9/18 UCx:  9/18 GI panel:  9/18 MRSA PCR:   Thank you for allowing pharmacy to be a part of this patient's care.  Cory Ibarra 08/21/2017 6:47 PM

## 2017-08-21 NOTE — H&P (View-Only) (Signed)
Reason for Consult: Gross Hematuria, Right Ureteral + Renal Stones, Bacteruria, Prostate Screening  Referring Physician: Chipper Oman MD  Cory Ibarra is an 56 y.o. male.   HPI:   1 - Gross Hematuria - new gross hematuria 08/2017. No clots.  Non-smoker. Known right ureteral stone as per below. Contrast CT within last 30 days w/o other GU masses. Known thrombocytopenia as well at 70k.  2 - Right Ureteral + Renal Stones - 9m Rt lower pole + 161mprox ureteral stone by CT 07/2017 incidental on lymphoma staging. No colic or interval passage.  3 - Bacteruria -large bacteruria noted during admission for neutropenic fever 08/2017. Admits to some irritative voiding x few days.   4 - Prostate Screening - PSA 2018 3.25.  PMH sig for lymphoma (just started chemo).   Today "Cory Ibarra seen as emergent consult for above. He is admitted to ICU for sepsis / neutropenic fever having recently started chemo for lymphoma. He also has copious loose stool.    Past Medical History:  Diagnosis Date  . Cancer ([place]  . Tendonitis     Past Surgical History:  Procedure Laterality Date  . HERNIA REPAIR  1988   Dr. FaVida Rigger. IR FLUORO GUIDE PORT INSERTION RIGHT  07/26/2017  . IR [place] VASC ACCESS RIGHT  07/26/2017    Family History  Problem Relation Age of Onset  . Cancer Father     Social History:  reports that he has never smoked. He has never used smokeless tobacco. He reports that he drinks alcohol. He reports that he does not use drugs.  Allergies:  Allergies  Allergen Reactions  . Bee Venom Swelling    Medications: I have reviewed the patient's current medications.  Results for orders placed or performed during the hospital encounter of 08/21/17 (from the past 48 hour(s))  Comprehensive metabolic panel     Status: Abnormal   Collection Time: 08/21/17  1:16 PM  Result Value Ref Range   Sodium 135 135 - 145 mmol/L   Potassium 3.5 3.5 - 5.1 mmol/L   Chloride 103 101 - 111 mmol/L   CO2 22 22  - 32 mmol/L   Glucose, Bld 144 (H) 65 - 99 mg/dL   BUN 12 6 - 20 mg/dL   Creatinine, Ser 0.63 0.61 - 1.24 mg/dL   Calcium 6.9 (L) 8.9 - 10.3 mg/dL   Total Protein 5.9 (L) 6.5 - 8.1 g/dL   Albumin 3.1 (L) 3.5 - 5.0 g/dL   AST 30 15 - 41 U/L   ALT 41 17 - 63 U/L   Alkaline Phosphatase 112 38 - 126 U/L   Total Bilirubin 2.2 (H) 0.3 - 1.2 mg/dL   GFR calc non Af Amer >60 >60 mL/min   GFR calc Af Amer >60 >60 mL/min    Comment: (NOTE) The eGFR has been calculated using the CKD EPI equation. This calculation has not been validated in all clinical situations. eGFR's persistently <60 mL/min signify possible Chronic Kidney Disease.    Anion gap 10 5 - 15  CBC with Differential     Status: Abnormal   Collection Time: 08/21/17  1:16 PM  Result Value Ref Range   WBC 0.1 (LL) 4.0 - 10.5 K/uL    Comment: REPEATED TO VERIFY CRITICAL RESULT CALLED TO, READ BACK BY AND VERIFIED WITH: RAND,K @ 1450 ON [id number] POTEAT,S    RBC 3.85 (L) 4.22 - 5.81 MIL/uL   Hemoglobin 9.8 (L) 13.0 - 17.0 g/dL  HCT 27.6 (L) 39.0 - 52.0 %   MCV 71.7 (L) 78.0 - 100.0 fL   MCH 25.5 (L) 26.0 - 34.0 pg   MCHC 35.5 30.0 - 36.0 g/dL   RDW 17.4 (H) 11.5 - 15.5 %   Platelets 76 (L) 150 - 400 K/uL    Comment: REPEATED TO VERIFY SPECIMEN CHECKED FOR CLOTS PLATELET COUNT CONFIRMED BY SMEAR    Neutrophils Relative % 8 %   Lymphocytes Relative 34 %   Monocytes Relative 58 %   Eosinophils Relative 0 %   Basophils Relative 0 %   RBC Morphology POLYCHROMASIA PRESENT    Neutro Abs 0.0 (L) 1.7 - 7.7 K/uL   Lymphs Abs 0.0 (L) 0.7 - 4.0 K/uL   Monocytes Absolute 0.1 0.1 - 1.0 K/uL   Eosinophils Absolute 0.0 0.0 - 0.7 K/uL   Basophils Absolute 0.0 0.0 - 0.1 K/uL  Protime-INR     Status: Abnormal   Collection Time: 08/21/17  1:16 PM  Result Value Ref Range   Prothrombin Time 15.4 (H) 11.4 - 15.2 seconds   INR 1.23   CK     Status: None   Collection Time: 08/21/17  1:16 PM  Result Value Ref Range   Total CK 65 49 -  397 U/L  Phosphorus     Status: Abnormal   Collection Time: 08/21/17  1:16 PM  Result Value Ref Range   Phosphorus 1.9 (L) 2.5 - 4.6 mg/dL  I-Stat CG4 Lactic Acid, ED     Status: Abnormal   Collection Time: 08/21/17  1:30 PM  Result Value Ref Range   Lactic Acid, Venous 2.06 (HH) 0.5 - 1.9 mmol/L   Comment NOTIFIED PHYSICIAN   Urinalysis, Routine w reflex microscopic     Status: Abnormal   Collection Time: 08/21/17  2:50 PM  Result Value Ref Range   Color, Urine AMBER (A) YELLOW    Comment: BIOCHEMICALS MAY BE AFFECTED BY COLOR   APPearance CLOUDY (A) CLEAR   Specific Gravity, Urine 1.021 1.005 - 1.030   pH 7.0 5.0 - 8.0   Glucose, UA NEGATIVE NEGATIVE mg/dL   Hgb urine dipstick LARGE (A) NEGATIVE   Bilirubin Urine SMALL (A) NEGATIVE   Ketones, ur 20 (A) NEGATIVE mg/dL   Protein, ur 100 (A) NEGATIVE mg/dL   Nitrite NEGATIVE NEGATIVE   Leukocytes, UA TRACE (A) NEGATIVE   RBC / HPF TOO NUMEROUS TO COUNT 0 - 5 RBC/hpf   WBC, UA 6-30 0 - 5 WBC/hpf   Bacteria, UA MANY (A) NONE SEEN   Squamous Epithelial / LPF 0-5 (A) NONE SEEN   Mucus PRESENT    Hyaline Casts, UA PRESENT   Lactic acid, plasma     Status: Abnormal   Collection Time: 08/21/17  4:55 PM  Result Value Ref Range   Lactic Acid, Venous 2.6 (HH) 0.5 - 1.9 mmol/L    Comment: CRITICAL RESULT CALLED TO, READ BACK BY AND VERIFIED WITH: S.EARLY RN AT 1752 ON 08/21/17 BY S.VANHOORNE MLS     Dg Chest 2 View  Result Date: 08/21/2017 CLINICAL DATA:  Dizzy in short of breath EXAM: CHEST  2 VIEW COMPARISON:  06/28/2017 FINDINGS: Right jugular Port-A-Cath placed with its tip at the cavoatrial junction. No pneumothorax. Lungs are hyperaerated and clear. Normal heart size. No pleural effusion. IMPRESSION: No active cardiopulmonary disease. Electronically Signed   By: Marybelle Killings M.D.   On: 08/21/2017 13:50   Dg Thoracic Spine 2 View  Result Date: 08/21/2017 CLINICAL DATA:  Pain. History of lower B-cell non-Hodgkin's lymphoma EXAM:  THORACIC SPINE 3 VIEWS COMPARISON:  MR thoracic spine June 28, 2017 and chest radiograph August 21, 2017 FINDINGS: Frontal, lateral, and swimmer's views obtained. There is no evident acute fracture or spondylolisthesis. The pedicle at T12 on the right is no longer apparent and is presumably destroyed by neoplasm. This finding was evident on prior MR. there is subtle loss of detail along the lateral left T8 pedicle, noted by prior MR to be involved with tumor. There is no obvious neoplastic involvement vertebral bodies by radiography. MR shows tumor involving several vertebral bodies. IMPRESSION: No fracture or spondylolisthesis. There are multiple metastatic foci seen in the thoracic spine performed three months prior. By radiography, there is loss of the pedicle of T12 on the right and subtle loss of the lateral aspect of the pedicle of T8 on the left. 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 No other neoplastic foci identified by radiography the bony structures. Note that there are multiple foci of tumor and thoracic vertebral bodies seen on recent MR not seen by radiography currently. Electronically Signed   By: Lowella Grip III M.D.   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 Electronically Signed   By: Lowella Grip III M.D.   On: 08/21/2017 16:02   Result Date: 08/21/2017 CLINICAL DATA:  Pain. History of large B-cell non-Hodgkin's lymphoma EXAM: LUMBAR SPINE - COMPLETE 4+ VIEW COMPARISON:  Lumbar MRI June 28, 2017 FINDINGS: Frontal, lateral, spot lumbosacral lateral, and bilateral oblique views were obtained. There are 5 non-rib-bearing lumbar type vertebral bodies. There is a stable fracture of the L3 vertebral body, more severe along the leftward aspect. There is lucency in this  area suggesting metastatic focus as likely etiology for the fracture. There is apparent destruction in the region of the pedicle on the left at L3. No other fracture evident. No spondylolisthesis. Disc spaces appear normal. No other lytic or destructive lesions. There is a probable bone island medial upper right iliac crest. There is facet osteoarthritic change at L4-5 and L5-S1 bilaterally. IMPRESSION: There is again noted fracture L3 vertebral body. Tumor extends into and involves the left L3 pedicle. This finding was present on prior lumbar MR. No new bone lesions are evident. No new fracture. No spondylolisthesis. There is facet osteoarthritic change at L4-5 L5-S1 bilaterally. Electronically Signed: By: Lowella Grip III M.D. On: 08/21/2017 15:52    Review of Systems  Constitutional: Positive for chills, diaphoresis, fever and malaise/fatigue.  HENT: Negative.   Eyes: Negative.   Respiratory: Negative.  Negative for wheezing.   Cardiovascular: Negative.   Gastrointestinal: Positive for abdominal pain and diarrhea.  Genitourinary: Positive for hematuria and urgency.  Musculoskeletal: Negative.   Skin: Negative.   Neurological: Positive for weakness.  Endo/Heme/Allergies: Negative.   Psychiatric/Behavioral: Negative.    Blood pressure (!) 64/18, pulse (!) 128, temperature 98.2 F (36.8 C), temperature source Oral, resp. rate (!) 37, height _0  (1.88 m), weight 76.6 kg (168 lb 14 oz), SpO2 99 %. Physical Exam  Constitutional: He appears well-developed.  Febrile with rigors in ICU.   HENT:  Head: Normocephalic.  Eyes: Pupils are equal, round, and reactive to light.  Neck: Normal range of motion.  Cardiovascular:  Regular tachycardia by bedside monitor.  Rt port accessed.   Respiratory: Effort normal.  GI: Soft.  Genitourinary: Penis normal.  Musculoskeletal: Normal range of motion.  Neurological: He is alert.  Skin: Skin is warm.  Psychiatric: He has a normal mood and affect.     Assessment/Plan:  1 - Gross Hematuria - likely from Rt ureteral stone + UTI.   Given his clinical picture of neutropenic fevers / sepsis I feel urgent right ureteral decompression warranted as infected / obstructed stone may be course / part of source. Explained to pt this will not treat stone, but rather relive obstruction and help allow infectious paramaters to clear followed by definitive stone treatment likely with ureteroscopy in elective setting.   2 - Right Ureteral + Renal Stones - plan as per above.   3 - Bacteruria - agree with current ABX pending further CX data. This may or may not be source of neutropenic fever.   4 - Prostate Screening - up to date this year.   Cory Ibarra 08/21/2017, 6:25 PM

## 2017-08-21 NOTE — ED Notes (Signed)
Bed: WA02 Expected date:  Expected time:  Means of arrival:  Comments: 

## 2017-08-21 NOTE — Anesthesia Preprocedure Evaluation (Signed)
Anesthesia Evaluation  Patient identified by MRN, date of birth, ID band Patient awake    Reviewed: Allergy & Precautions, NPO status , Patient's Chart, lab work & pertinent test results  Airway Mallampati: II  TM Distance: >3 FB Neck ROM: Full    Dental no notable dental hx.    Pulmonary neg pulmonary ROS,    Pulmonary exam normal breath sounds clear to auscultation       Cardiovascular negative cardio ROS Normal cardiovascular exam Rhythm:Regular Rate:Normal     Neuro/Psych negative neurological ROS  negative psych ROS   GI/Hepatic negative GI ROS, Neg liver ROS,   Endo/Other  negative endocrine ROS  Renal/GU Renal disease  negative genitourinary   Musculoskeletal negative musculoskeletal ROS (+) Lumbar spine tumor fracture   Abdominal   Peds negative pediatric ROS (+)  Hematology  (+) Blood dyscrasia, , Recent diagnosis of NHL. Neutropenia   Anesthesia Other Findings Septic  Reproductive/Obstetrics negative OB ROS                             Anesthesia Physical Anesthesia Plan  ASA: III and emergent  Anesthesia Plan: General   Post-op Pain Management:    Induction: Intravenous  PONV Risk Score and Plan: 2 and Ondansetron, Dexamethasone and Treatment may vary due to age or medical condition  Airway Management Planned: LMA  Additional Equipment:   Intra-op Plan:   Post-operative Plan: Extubation in OR  Informed Consent: I have reviewed the patients History and Physical, chart, labs and discussed the procedure including the risks, benefits and alternatives for the proposed anesthesia with the patient or authorized representative who has indicated his/her understanding and acceptance.   Dental advisory given  Plan Discussed with: CRNA  Anesthesia Plan Comments:         Anesthesia Quick Evaluation

## 2017-08-21 NOTE — Procedures (Signed)
Pre Procedure Dx: Hydronephrosis, urosepsis Post Procedural Dx: Same  Successful [place] and fluoroscopic guided placement of a right sided PCN with end coiled and locked within the renal pelvis. PCN connected to gravity bag.  Sample of urine capped and sent to lab for analysis.  EBL: Minimal  Complications: None immediate.  Ronny Bacon, MD Pager #: [phone]

## 2017-08-22 ENCOUNTER — Encounter (HOSPITAL_COMMUNITY): Payer: Self-pay | Admitting: Interventional Radiology

## 2017-08-22 DIAGNOSIS — D709 Neutropenia, unspecified: Secondary | ICD-10-CM

## 2017-08-22 DIAGNOSIS — R509 Fever, unspecified: Secondary | ICD-10-CM

## 2017-08-22 DIAGNOSIS — R42 Dizziness and giddiness: Secondary | ICD-10-CM

## 2017-08-22 DIAGNOSIS — I959 Hypotension, unspecified: Secondary | ICD-10-CM

## 2017-08-22 DIAGNOSIS — R0602 Shortness of breath: Secondary | ICD-10-CM

## 2017-08-22 DIAGNOSIS — R6521 Severe sepsis with septic shock: Secondary | ICD-10-CM

## 2017-08-22 DIAGNOSIS — A419 Sepsis, unspecified organism: Secondary | ICD-10-CM

## 2017-08-22 LAB — TROPONIN I: TROPONIN I: 0.88 ng/mL — AB (ref <0.03)

## 2017-08-22 LAB — CBC
HEMATOCRIT: 24.2 % — AB (ref 39.0–52.0)
HEMOGLOBIN: 8.5 g/dL — AB (ref 13.0–17.0)
MCH: 25.7 pg — ABNORMAL LOW (ref 26.0–34.0)
MCHC: 35.1 g/dL (ref 30.0–36.0)
MCV: 73.1 fL — ABNORMAL LOW (ref 78.0–100.0)
Platelets: 70 10*3/uL — ABNORMAL LOW (ref 150–400)
RBC: 3.31 MIL/uL — AB (ref 4.22–5.81)
RDW: 18.1 % — AB (ref 11.5–15.5)
WBC: 0.1 10*3/uL — AB (ref 4.0–10.5)

## 2017-08-22 LAB — BASIC METABOLIC PANEL
ANION GAP: 13 (ref 5–15)
BUN: 16 mg/dL (ref 6–20)
CO2: 14 mmol/L — ABNORMAL LOW (ref 22–32)
Calcium: 5.9 mg/dL — CL (ref 8.9–10.3)
Chloride: 107 mmol/L (ref 101–111)
Creatinine, Ser: 0.75 mg/dL (ref 0.61–1.24)
Glucose, Bld: 183 mg/dL — ABNORMAL HIGH (ref 65–99)
POTASSIUM: 3.2 mmol/L — AB (ref 3.5–5.1)
SODIUM: 134 mmol/L — AB (ref 135–145)

## 2017-08-22 LAB — GASTROINTESTINAL PANEL BY PCR, STOOL (REPLACES STOOL CULTURE)

## 2017-08-22 LAB — LACTIC ACID, PLASMA
LACTIC ACID, VENOUS: 3.2 mmol/L — AB (ref 0.5–1.9)
Lactic Acid, Venous: 3.3 mmol/L (ref 0.5–1.9)

## 2017-08-22 LAB — URINE CULTURE

## 2017-08-22 LAB — BLOOD CULTURE ID PANEL (REFLEXED)
ACINETOBACTER BAUMANNII: NOT DETECTED
CARBAPENEM RESISTANCE: NOT DETECTED
Candida albicans: NOT DETECTED
Candida glabrata: NOT DETECTED
Candida krusei: NOT DETECTED
Candida parapsilosis: NOT DETECTED
Candida tropicalis: NOT DETECTED
Enterobacter cloacae complex: NOT DETECTED
Enterobacteriaceae species: DETECTED — AB
Enterococcus species: NOT DETECTED
Escherichia coli: DETECTED — AB
HAEMOPHILUS INFLUENZAE: NOT DETECTED
Klebsiella oxytoca: NOT DETECTED
Klebsiella pneumoniae: NOT DETECTED
LISTERIA MONOCYTOGENES: NOT DETECTED
METHICILLIN RESISTANCE: NOT DETECTED
NEISSERIA MENINGITIDIS: NOT DETECTED
Proteus species: NOT DETECTED
Pseudomonas aeruginosa: NOT DETECTED
SERRATIA MARCESCENS: NOT DETECTED
STAPHYLOCOCCUS AUREUS BCID: NOT DETECTED
STREPTOCOCCUS PYOGENES: NOT DETECTED
STREPTOCOCCUS SPECIES: NOT DETECTED
Staphylococcus species: NOT DETECTED
Streptococcus agalactiae: NOT DETECTED
Streptococcus pneumoniae: NOT DETECTED
VANCOMYCIN RESISTANCE: NOT DETECTED

## 2017-08-22 LAB — GLUCOSE, CAPILLARY
GLUCOSE-CAPILLARY: 122 mg/dL — AB (ref 65–99)
GLUCOSE-CAPILLARY: 110 mg/dL — AB (ref 65–99)
Glucose-Capillary: 179 mg/dL — ABNORMAL HIGH (ref 65–99)
Glucose-Capillary: 120 mg/dL — ABNORMAL HIGH (ref 65–99)

## 2017-08-22 LAB — PROCALCITONIN: Procalcitonin: 69.28 ng/mL

## 2017-08-22 LAB — PHOSPHORUS: PHOSPHORUS: 3.4 mg/dL (ref 2.5–4.6)

## 2017-08-22 LAB — MAGNESIUM: Magnesium: 1.8 mg/dL (ref 1.7–2.4)

## 2017-08-22 MED ORDER — ZOLPIDEM TARTRATE 5 MG PO TABS
5.0000 mg | ORAL_TABLET | Freq: Once | ORAL | Status: AC
  Administered 2017-08-22: 5 mg via ORAL
  Filled 2017-08-22: qty 1

## 2017-08-22 MED ORDER — DEXTROSE 5 % IV SOLN
2.0000 g | INTRAVENOUS | Status: DC
  Administered 2017-08-22 – 2017-08-28 (×7): 2 g via INTRAVENOUS
  Filled 2017-08-22 (×7): qty 2

## 2017-08-22 MED ORDER — HYDROCORTISONE NA SUCCINATE PF 100 MG IJ SOLR
50.0000 mg | Freq: Four times a day (QID) | INTRAMUSCULAR | Status: DC
  Administered 2017-08-22 – 2017-08-24 (×8): 50 mg via INTRAVENOUS
  Filled 2017-08-22 (×8): qty 2

## 2017-08-22 MED ORDER — POTASSIUM CHLORIDE 10 MEQ/50ML IV SOLN
10.0000 meq | INTRAVENOUS | Status: AC
  Administered 2017-08-22 (×2): 10 meq via INTRAVENOUS
  Filled 2017-08-22 (×2): qty 50

## 2017-08-22 MED ORDER — PANTOPRAZOLE SODIUM 40 MG PO TBEC
40.0000 mg | DELAYED_RELEASE_TABLET | Freq: Every day | ORAL | Status: DC
  Administered 2017-08-22 – 2017-08-31 (×8): 40 mg via ORAL
  Filled 2017-08-22 (×12): qty 1

## 2017-08-22 MED ORDER — MAGNESIUM SULFATE 2 GM/50ML IV SOLN
2.0000 g | Freq: Once | INTRAVENOUS | Status: AC
  Administered 2017-08-22: 2 g via INTRAVENOUS
  Filled 2017-08-22: qty 50

## 2017-08-22 MED ORDER — ORAL CARE MOUTH RINSE
15.0000 mL | Freq: Two times a day (BID) | OROMUCOSAL | Status: DC
  Administered 2017-08-22 – 2017-09-02 (×8): 15 mL via OROMUCOSAL

## 2017-08-22 MED ORDER — MUPIROCIN 2 % EX OINT
1.0000 "application " | TOPICAL_OINTMENT | Freq: Two times a day (BID) | CUTANEOUS | Status: AC
  Administered 2017-08-23 – 2017-08-26 (×9): 1 via NASAL
  Filled 2017-08-22: qty 22

## 2017-08-22 MED ORDER — CHLORHEXIDINE GLUCONATE CLOTH 2 % EX PADS
6.0000 | MEDICATED_PAD | Freq: Every day | CUTANEOUS | Status: DC
  Administered 2017-08-23: 6 via TOPICAL

## 2017-08-22 MED ORDER — SODIUM CHLORIDE 0.9 % IV SOLN
1.0000 g | Freq: Once | INTRAVENOUS | Status: AC
  Administered 2017-08-22: 1 g via INTRAVENOUS

## 2017-08-22 MED ORDER — IOPAMIDOL (ISOVUE-300) INJECTION 61%
INTRAVENOUS | Status: AC
  Filled 2017-08-22: qty 100

## 2017-08-22 MED ORDER — CHLORHEXIDINE GLUCONATE 0.12 % MT SOLN
15.0000 mL | Freq: Two times a day (BID) | OROMUCOSAL | Status: DC
  Administered 2017-08-22 – 2017-09-01 (×14): 15 mL via OROMUCOSAL
  Filled 2017-08-22 (×12): qty 15

## 2017-08-22 NOTE — Progress Notes (Signed)
Referring Physician(s): Manny,T  Supervising Physician: Arne Cleveland  Patient Status:  [facility] - In-pt  Chief Complaint: Right ureteral stone/urosepsis   Subjective:  Pt states he feels better today; no sig rt flank pain, nausea improved  Allergies: Bee venom  Medications: Prior to Admission medications   Medication Sig Start Date End Date Taking? Authorizing Provider  allopurinol (ZYLOPRIM) 300 MG tablet Take 1 tablet (300 mg total) by mouth daily. 07/16/17 08/21/17 Yes Perlov, Marinell Blight, MD  dexamethasone (DECADRON) 4 MG tablet Take 4 mg by mouth every other day.    Yes [provider]  lidocaine-prilocaine (EMLA) cream Apply to affected area once 07/24/17  Yes Perlov, Marinell Blight, MD  LORazepam (ATIVAN) 0.5 MG tablet Take 1 tablet (0.5 mg total) by mouth every 6 (six) hours as needed (Nausea or vomiting). 07/24/17  Yes Perlov, Marinell Blight, MD  oxyCODONE (OXY IR/ROXICODONE) 5 MG immediate release tablet Take 1 tablet (5 mg total) by mouth every 4 (four) hours as needed for moderate pain or severe pain. 08/13/17 09/03/17 Yes Hayden Pedro, PA-C  pantoprazole (PROTONIX) 40 MG tablet Take 1 tablet (40 mg total) by mouth daily at 12 noon. 08/13/17  Yes Hayden Pedro, PA-C  polyethylene glycol [facility] / Floria Raveling) packet Take 17 g by mouth daily as needed for mild constipation. 07/03/17  Yes Domenic Polite, MD  ondansetron (ZOFRAN) 8 MG tablet Take 1 tablet (8 mg total) by mouth 2 (two) times daily as needed for refractory nausea / vomiting. Start on day 3 after cyclophosphamide chemotherapy. Patient not taking: Reported on 08/21/2017 07/24/17   Ardath Sax, MD     Vital Signs: BP 115/70   Pulse 89   Temp 98.2 F (36.8 C) (Oral)   Resp (!) 22   Ht 6\' 2"  (1.88 m)   Wt 168 lb 14 oz (76.6 kg)   SpO2 100%   BMI 21.68 kg/m   Physical Exam rt PCN intact, dressing dry, not sig tender, output 50 + cc tan colored urine  Imaging: Dg Chest 2 View  Result  Date: 08/21/2017 CLINICAL DATA:  Dizzy in short of breath EXAM: CHEST  2 VIEW COMPARISON:  06/28/2017 FINDINGS: Right jugular Port-A-Cath placed with its tip at the cavoatrial junction. No pneumothorax. Lungs are hyperaerated and clear. Normal heart size. No pleural effusion. IMPRESSION: No active cardiopulmonary disease. Electronically Signed   By: Marybelle Killings M.D.   On: 08/21/2017 13:50   Dg Thoracic Spine 2 View  Result Date: 08/21/2017 CLINICAL DATA:  Pain. History of lower B-cell non-Hodgkin's lymphoma EXAM: THORACIC SPINE 3 VIEWS COMPARISON:  MR thoracic spine June 28, 2017 and chest radiograph August 21, 2017 FINDINGS: Frontal, lateral, and swimmer's views obtained. There is no evident acute fracture or spondylolisthesis. The pedicle at T12 on the right is no longer apparent and is presumably destroyed by neoplasm. This finding was evident on prior MR. there is subtle loss of detail along the lateral left T8 pedicle, noted by prior MR to be involved with tumor. There is no obvious neoplastic involvement vertebral bodies by radiography. MR shows tumor involving several vertebral bodies. IMPRESSION: No fracture or spondylolisthesis. There are multiple metastatic foci seen in the thoracic spine performed three months prior. By radiography, there is loss of the pedicle of T12 on the right and subtle loss of the lateral aspect of the pedicle of T8 on the left. 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 No other neoplastic foci identified  by radiography the bony structures. Note that there are multiple foci of tumor and thoracic vertebral bodies seen on recent MR not seen by radiography currently. Electronically Signed   By: Lowella Grip III M.D.   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  Electronically Signed   By: Lowella Grip III M.D.   On: 08/21/2017 16:02   Result Date: 08/21/2017 CLINICAL DATA:  Pain. History of large B-cell non-Hodgkin's lymphoma EXAM: LUMBAR SPINE - COMPLETE 4+ VIEW COMPARISON:  Lumbar MRI June 28, 2017 FINDINGS: Frontal, lateral, spot lumbosacral lateral, and bilateral oblique views were obtained. There are 5 non-rib-bearing lumbar type vertebral bodies. There is a stable fracture of the L3 vertebral body, more severe along the leftward aspect. There is lucency in this area suggesting metastatic focus as likely etiology for the fracture. There is apparent destruction in the region of the pedicle on the left at L3. No other fracture evident. No spondylolisthesis. Disc spaces appear normal. No other lytic or destructive lesions. There is a probable bone island medial upper right iliac crest. There is facet osteoarthritic change at L4-5 and L5-S1 bilaterally. IMPRESSION: There is again noted fracture L3 vertebral body. Tumor extends into and involves the left L3 pedicle. This finding was present on prior lumbar MR. No new bone lesions are evident. No new fracture. No spondylolisthesis. There is facet osteoarthritic change at L4-5 L5-S1 bilaterally. Electronically Signed: By: Lowella Grip III M.D. On: 08/21/2017 15:52   Dg C-arm 1-60 Min-no Report  Result Date: 08/21/2017 Fluoroscopy was utilized by the requesting physician.  No radiographic interpretation.   Ir Nephrostomy Placement Right  Result Date: 08/22/2017 INDICATION: History of lymphoma, admitted with neutropenic fever in urosepsis secondary to right-sided ureteral stone. Post failed attempted right-sided ureteral stent placement. As such, request made for placement of a right-sided percutaneous nephrostomy catheter for infection source control purposes. EXAM: 1. ULTRASOUND GUIDANCE FOR PUNCTURE OF THE RIGHT RENAL COLLECTING SYSTEM 2. RIGHT PERCUTANEOUS NEPHROSTOMY TUBE PLACEMENT. COMPARISON:  CT  abdomen pelvis - 07/30/2017; right-sided retrograde pyelogram and attempted stent placement - earlier same day MEDICATIONS: The patient is currently admitted to the hospital and receiving intravenous antibiotics.;The antibiotic was administered in an appropriate time frame prior to skin puncture. ANESTHESIA/SEDATION: Moderate (conscious) sedation was employed during this procedure. A total of Versed 1 mg and Fentanyl 75 mcg was administered intravenously. Moderate Sedation Time: 5 minutes. The patient's level of consciousness and vital signs were monitored continuously by radiology nursing throughout the procedure under my direct supervision. CONTRAST:  20 mL Isovue 300 administered into the collecting system FLUOROSCOPY TIME:  1 minute 6 seconds (62.8 mGy) COMPLICATIONS: None immediate. PROCEDURE: The procedure, risks, benefits, and alternatives were explained to the patient. Questions regarding the procedure were encouraged and answered. The patient understands and consents to the procedure. A timeout was performed prior to the initiation of the procedure. The right flank region was prepped with Betadine in a sterile fashion, and a sterile drape was applied covering the operative field. A sterile gown and sterile gloves were used for the procedure. Local anesthesia was provided with 1% Lidocaine with epinephrine. Ultrasound was used to localize the right kidney. Under direct ultrasound guidance, a 21 gauge needle was advanced into the renal collecting system. An ultrasound image documentation was performed. Access within the  collecting system was confirmed with the efflux of urine followed by contrast injection. Over a Nitrex wire, the inner three [place] catheter of an Accustick set was advanced into the renal collecting system. Contrast injection was injected into the collecting system as several spot radiographs were obtained in various obliquities confirming puncture within a posterior inferior calix. As such,  the tract was dilated with an Accustick stent. Over a guide wire, a 10-French percutaneous nephrostomy catheter was advanced into the collecting system where the coil was formed and locked. Contrast was injected and several sport radiographs were obtained in various obliquities confirming access. The catheter was secured at the skin with a Prolene retention suture and a gravity bag was placed. A dressing was placed. The patient tolerated procedure well without immediate postprocedural complication. FINDINGS: Ultrasound scanning demonstrates a moderately dilated right renal collecting system. Under direct ultrasound guidance, a posterior inferior calix was targeted allowing advancement of an 10-French percutaneous nephrostomy catheter under intermittent fluoroscopic guidance. Contrast injection confirmed appropriate positioning. Note is again made of an occlusive stone within the distal aspect of the right ureter. IMPRESSION: Successful ultrasound and fluoroscopic guided placement of a right sided 10 French PCN. This stone may be use for subsequent percutaneous nephrolithotomy at a later date as clinically indicated. Electronically Signed   By: Sandi Mariscal M.D.   On: 08/22/2017 07:59    Labs:  CBC:  Recent Labs  08/16/17 0924 08/21/17 1316 08/21/17 2249 08/22/17 0332  WBC 2.7* 0.1* <0.1* 0.1*  HGB 11.0* 9.8* 8.1* 8.5*  HCT 32.3* 27.6* 22.6* 24.2*  PLT 158 76* 58* 70*    COAGS:  Recent Labs  06/29/17 0847 07/26/17 1049 08/21/17 1316  INR 1.06 1.13 1.23  APTT  --  29  --     BMP:  Recent Labs  06/29/17 0351  07/26/17 1049 08/08/17 1329 08/16/17 0924 08/21/17 1316 08/22/17 0332  NA 139  < > 130* 137 135* 135 134*  K 4.1  < > 3.5 3.5 3.6 3.5 3.2*  CL 103  --  94*  --   --  103 107  CO2 27  < > 25 27 25 22  14*  GLUCOSE 130*  < > 92 101 83 144* 183*  BUN 20  < > 18 12.3 10.4 12 16   CALCIUM 10.0  < > 9.4 10.6* 8.1* 6.9* 5.9*  CREATININE 0.70  < > 0.68 0.8 0.6* 0.63 0.75    GFRNONAA >60  --  >60  --   --  >60 >60  GFRAA >60  --  >60  --   --  >60 >60  < > = values in this interval not displayed.  LIVER FUNCTION TESTS:  Recent Labs  07/16/17 1433 08/08/17 1329 08/16/17 0924 08/21/17 1316  BILITOT 1.27* 0.85 0.89 2.2*  AST 44* 22 27 30   ALT 148* 22 59* 41  ALKPHOS 173* 106 110 112  PROT 7.1 6.9 6.4 5.9*  ALBUMIN 3.7 3.4* 3.4* 3.1*    Assessment and Plan: Lymphoma, right ureteral stone/urosepsis, failed ureteral stent placement; s/p rt PCN 9/18; AF; WBC 0.1, hgb 8.5, plts 70k, creat 0.75, K 3.2, troponins elevated- demand ischemia; blood cx- enterobacter/ e coli; cont current tx as per CCM/urology/oncology- tube to remain in pt until urology deems appropriate to remove/further stone eval  Electronically Signed: D. Rowe Robert, PA-C 08/22/2017, 11:46 AM   I spent a total of 15 minutes at the the patient's bedside AND on the patient's hospital floor or unit, greater  than 50% of which was counseling/coordinating care for right nephrostomy    Patient ID: Cory Ibarra, male   DOB: Jul 11, 1961, 56 y.o.   MRN: [id number]

## 2017-08-22 NOTE — Op Note (Signed)
NAMEMarland Kitchen  Cory, Ibarra NO.:  [id number]  MEDICAL RECORD NO.:  [id number]  LOCATION:  5176                         FACILITY:  [facility]  PHYSICIAN:  Alexis Frock, MD     DATE OF BIRTH:  Jan 22, 1961  DATE OF PROCEDURE: 08/21/2017                              OPERATIVE REPORT   DIAGNOSES:  Sepsis, possible urinary origin, right impacted ureteral stone.  PROCEDURE:  Cystoscopy with right retrograde pyelogram and interpretation.  ESTIMATED BLOOD LOSS:  Nil.  COMPLICATIONS:  None.  SPECIMEN:  None.  FINDINGS: 1. Completely-impacted right proximal ureteral stone with mild-to-     moderate hydronephrosis. 2. Inability to gain wire access above the right stone due to     impaction, inability to place right ureteral stent.  DRAINS:  A 16-French Foley catheter to straight drain.  INDICATION:  Cory Ibarra is a 56 year old gentleman with recent history of lymphoma on chemotherapy, who presented with neutropenic fever today and sepsis.  He has bacteriuria, a known proximal ureteral stone as well as loose stools.  It is unclear what the primary is, but there is concern for urosepsis that has obstructed based on his imaging review and labs today.  Options were discussed including nephrostomy tube versus attempted ureteral stenting, and he wished to proceed with attempted ureteral stenting emergently.  Informed consent was obtained and placed in the medical record.  PROCEDURE IN DETAIL:  The patient being, Cory Ibarra was verified. Procedure being right ureteral stent placement was confirmed.  Procedure was carried out.  Time-out was performed.  Intravenous antibiotics were administered.  General LMA anesthesia was introduced.  The patient was placed into a low lithotomy position.  Sterile field was created by prepping and draping the patient's penis, perineum and proximal thighs using iodine.  Next, cystourethroscopy was performed using a 22-French rigid cystoscope with  offset lens.  Inspection of the urinary bladder revealed some mild trabeculation.  The right ureteral orifice appeared [place].  There was a very small [place] diverticulum next to this.  The right ureteral orifice was cannulated with a 6-French end-hole catheter and right retrograde pyelogram was obtained.  Right retrograde pyelogram demonstrated a single right ureter with single-system right kidney.  There was a calcification turned filling defect in the proximal ureter consistent with known stone.  This was quite impacted.  There was only a minimal stream of contrast that was able to traverse proximal to this.  There was mild-to-moderate hydronephrosis above this.  A 0.038 Zip wire was advanced to the level of the stone and multiple angulations were used, but this was unable to be cannulated across the stone.  The open-ended catheter was advanced to the level of the stone to gain more rigidity and gain multiple angulations with the angled-tip wire were used and unable to achieve wire access across this.  A Sensor straight wire was also used and again multiple angulations were performed and unable to pass this above the stone.  The open-ended catheter was exchanged for an angled-tip KMP catheter and again, multiple angulations were used as a sensor wire and unable to achieve wire access across this and finally using a combination of KMP  catheter and angle-tip Zip wire, we were unable to achieve access across this.  As such, the stone appeared to be completely impacted.  The patient remained clinically septic, it was felt that abandoning the procedure and request nephrostomy tube would be must prudent means to proceed.  As such, a 16-French Foley catheter was placed per urethra to straight drain and procedure was terminated.  The patient tolerated the procedure well and was taken to the postanesthesia care unit in critical condition.          ______________________________ Alexis Frock, MD     TM/MEDQ  D:  08/21/2017  T:  08/22/2017  Job:  [id number]

## 2017-08-22 NOTE — Progress Notes (Signed)
PHARMACY - PHYSICIAN COMMUNICATION CRITICAL VALUE ALERT - BLOOD CULTURE IDENTIFICATION (BCID)  Results for orders placed or performed during the hospital encounter of 08/21/17  Blood Culture ID Panel (Reflexed) (Collected: 08/21/2017  1:23 PM)  Result Value Ref Range   Enterococcus species NOT DETECTED NOT DETECTED   Vancomycin resistance NOT DETECTED NOT DETECTED   Listeria monocytogenes NOT DETECTED NOT DETECTED   Staphylococcus species NOT DETECTED NOT DETECTED   Staphylococcus aureus NOT DETECTED NOT DETECTED   Methicillin resistance NOT DETECTED NOT DETECTED   Streptococcus species NOT DETECTED NOT DETECTED   Streptococcus agalactiae NOT DETECTED NOT DETECTED   Streptococcus pneumoniae NOT DETECTED NOT DETECTED   Streptococcus pyogenes NOT DETECTED NOT DETECTED   Acinetobacter baumannii NOT DETECTED NOT DETECTED   Enterobacteriaceae species DETECTED (A) NOT DETECTED   Enterobacter cloacae complex NOT DETECTED NOT DETECTED   Escherichia coli DETECTED (A) NOT DETECTED   Klebsiella oxytoca NOT DETECTED NOT DETECTED   Klebsiella pneumoniae NOT DETECTED NOT DETECTED   Proteus species NOT DETECTED NOT DETECTED   Serratia marcescens NOT DETECTED NOT DETECTED   Carbapenem resistance NOT DETECTED NOT DETECTED   Haemophilus influenzae NOT DETECTED NOT DETECTED   Neisseria meningitidis NOT DETECTED NOT DETECTED   Pseudomonas aeruginosa NOT DETECTED NOT DETECTED   Candida albicans NOT DETECTED NOT DETECTED   Candida glabrata NOT DETECTED NOT DETECTED   Candida krusei NOT DETECTED NOT DETECTED   Candida parapsilosis NOT DETECTED NOT DETECTED   Candida tropicalis NOT DETECTED NOT DETECTED    Name of physician (or Provider) Contacted: Dr. Halford Chessman  Changes to prescribed antibiotics required: change vancomycin and zosyn to ceftriaxone 2g IV q24h  Peggyann Juba, PharmD, BCPS Pager: [phone] 08/22/2017  8:48 AM

## 2017-08-22 NOTE — Progress Notes (Signed)
CRITICAL VALUE ALERT  Critical Value:  Lactic 3.2  Date & Time Notified:  08/22/17 at 0836   Provider Notified: MD Halford Chessman  Orders Received/Actions taken: Continue to monitor and assess. Will repeat lactic acid tomorrow AM.

## 2017-08-22 NOTE — Progress Notes (Signed)
eLink Physician-Brief Progress Note Patient Name: Mickel Schreur DOB: 1961-05-18 MRN: [id number]   Date of Service  08/22/2017  HPI/Events of Note  Mg 1.4 Tp 0.98  eICU Interventions  Hypomag -repleted Low plts - cannot use ASA Serial trop      Intervention Category Intermediate Interventions: Diagnostic test evaluation  ALVA,RAKESH V. 08/22/2017, 12:25 AM

## 2017-08-22 NOTE — Progress Notes (Signed)
1 Day Post-Op   Subjective/Chief Complaint:  1 - Gross Hematuria - new gross hematuria 08/2017. No clots.  Non-smoker. Known right ureteral stone as per below. Contrast CT within last 30 days w/o other GU masses. Known thrombocytopenia as well at 70k.  2 - Right Ureteral + Renal Stones - 85mm Rt lower pole + 8mm prox ureteral stone by CT 07/2017 incidental on lymphoma staging. No colic or interval passage. Rt neph tube placed by IR 08/21/17 after failed attempt at retrograde stenting (ureteral stone too impacted).   3 - Bacteruria -large bacteruria noted during admission for neutropenic fever 08/2017. Admits to some irritative voiding x few days. UCX 9/18 pending, on empiric Vanc + Zosyn.  4 - Prostate Screening - PSA 2018 3.25.  PMH sig for lymphoma (just started chemo).   Today "Cory Ibarra" is remains critically ill though improving. Less malaise, improving tachycardia. Succesful Rt neph tube placement last PM.    Objective: Vital signs in last 24 hours: Temp:  [97.8 F (36.6 C)-98.3 F (36.8 C)] 97.8 F (36.6 C) (09/19 0313) Pulse Rate:  [94-153] 94 (09/19 0500) Resp:  [18-37] 20 (09/19 0500) BP: (56-126)/(18-85) 122/81 (09/19 0500) SpO2:  [94 %-100 %] 100 % (09/19 0500) Weight:  [76.6 kg (168 lb 14 oz)-81.6 kg (180 lb)] 76.6 kg (168 lb 14 oz) (09/18 1643) Last BM Date: 08/21/17  Intake/Output from previous day: 09/18 0701 - 09/19 0700 In: 11491.9 [I.V.:8926.9; IV Piggyback:2550] Out: 150 [Urine:100; Drains:50] Intake/Output this shift: Total I/O In: 9441.9 [I.V.:8926.9; Other:15; IV Piggyback:500] Out: 150 [Urine:100; Drains:50]  General appearance: alert, cooperative, appears stated age and less maliase, no more rigors.  Eyes: negative Nose: Nares normal. Septum midline. Mucosa normal. No drainage or sinus tenderness. Throat: lips, mucosa, and tongue normal; teeth and gums normal Neck: supple, symmetrical, trachea midline Back: symmetric, no curvature. ROM normal. No CVA  tenderness. Resp: non-labored on room air.  Cardio: regular tachcardia to 110s by bedside monitor.  GI: soft, non-tender; bowel sounds normal; no masses,  no organomegaly Male genitalia: normal, foley in place with concentrated urine. Rt neph tube in place with concentrated urine.  Extremities: extremities normal, atraumatic, no cyanosis or edema Pulses: 2+ and symmetric Skin: Skin color, texture, turgor normal. No rashes or lesions  Lab Results:   Recent Labs  08/21/17 2249 08/22/17 0332  WBC <0.1* 0.1*  HGB 8.1* 8.5*  HCT 22.6* 24.2*  PLT 58* 70*   BMET  Recent Labs  08/21/17 1316 08/22/17 0332  NA 135 134*  K 3.5 3.2*  CL 103 107  CO2 22 14*  GLUCOSE 144* 183*  BUN 12 16  CREATININE 0.63 0.75  CALCIUM 6.9* 5.9*   PT/INR  Recent Labs  08/21/17 1316  LABPROT 15.4*  INR 1.23   ABG  Recent Labs  08/21/17 1850  PHART 7.470*  HCO3 14.4*    Studies/Results: Dg Chest 2 View  Result Date: 08/21/2017 CLINICAL DATA:  Dizzy in short of breath EXAM: CHEST  2 VIEW COMPARISON:  06/28/2017 FINDINGS: Right jugular Port-A-Cath placed with its tip at the cavoatrial junction. No pneumothorax. Lungs are hyperaerated and clear. Normal heart size. No pleural effusion. IMPRESSION: No active cardiopulmonary disease. Electronically Signed   By: Marybelle Killings M.D.   On: 08/21/2017 13:50   Dg Thoracic Spine 2 View  Result Date: 08/21/2017 CLINICAL DATA:  Pain. History of lower B-cell non-Hodgkin's lymphoma EXAM: THORACIC SPINE 3 VIEWS COMPARISON:  MR thoracic spine June 28, 2017 and chest radiograph August 21, 2017 FINDINGS: Frontal, lateral, and swimmer's views obtained. There is no evident acute fracture or spondylolisthesis. The pedicle at T12 on the right is no longer apparent and is presumably destroyed by neoplasm. This finding was evident on prior MR. there is subtle loss of detail along the lateral left T8 pedicle, noted by prior MR to be involved with tumor. There is no  obvious neoplastic involvement vertebral bodies by radiography. MR shows tumor involving several vertebral bodies. IMPRESSION: No fracture or spondylolisthesis. There are multiple metastatic foci seen in the thoracic spine performed three months prior. By radiography, there is loss of the pedicle of T12 on the right and subtle loss of the lateral aspect of the pedicle of T8 on the left. 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 No other neoplastic foci identified by radiography the bony structures. Note that there are multiple foci of tumor and thoracic vertebral bodies seen on recent MR not seen by radiography currently. Electronically Signed   By: Lowella Grip III M.D.   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 Electronically Signed   By: Lowella Grip III M.D.   On: 08/21/2017 16:02   Result Date: 08/21/2017 CLINICAL DATA:  Pain. History of large B-cell non-Hodgkin's lymphoma EXAM: LUMBAR SPINE - COMPLETE 4+ VIEW COMPARISON:  Lumbar MRI June 28, 2017 FINDINGS: Frontal, lateral, spot lumbosacral lateral, and bilateral oblique views were obtained. There are 5 non-rib-bearing lumbar type vertebral bodies. There is a stable fracture of the L3 vertebral body, more severe along the leftward aspect. There is lucency in this area suggesting metastatic focus as likely etiology for the fracture. There is apparent destruction in the region of the pedicle on the left at L3. No other fracture evident. No spondylolisthesis. Disc spaces appear normal. No other lytic or destructive lesions. There is a probable bone island medial upper right iliac crest. There is facet osteoarthritic change at L4-5 and L5-S1 bilaterally. IMPRESSION: There is again noted fracture L3 vertebral body. Tumor extends into and  involves the left L3 pedicle. This finding was present on prior lumbar MR. No new bone lesions are evident. No new fracture. No spondylolisthesis. There is facet osteoarthritic change at L4-5 L5-S1 bilaterally. Electronically Signed: By: Lowella Grip III M.D. On: 08/21/2017 15:52   Dg C-arm 1-60 Min-no Report  Result Date: 08/21/2017 Fluoroscopy was utilized by the requesting physician.  No radiographic interpretation.    Anti-infectives: Anti-infectives    Start     Dose/Rate Route Frequency Ordered Stop   08/22/17 0000  vancomycin (VANCOCIN) IVPB 750 mg/150 ml premix     750 mg 150 mL/hr over 60 Minutes Intravenous Every 8 hours 08/21/17 1858     08/21/17 2200  piperacillin-tazobactam (ZOSYN) IVPB 3.375 g     3.375 g 12.5 mL/hr over 240 Minutes Intravenous Every 8 hours 08/21/17 1858     08/21/17 1530  vancomycin (VANCOCIN) 1,500 mg in sodium chloride 0.9 % 500 mL IVPB     1,500 mg 250 mL/hr over 120 Minutes Intravenous  Once 08/21/17 1525 08/21/17 1745   08/21/17 1500  piperacillin-tazobactam (ZOSYN) IVPB 3.375 g     3.375 g 100 mL/hr over 30 Minutes Intravenous  Once 08/21/17 1451 08/21/17 1550   08/21/17 1500  vancomycin (VANCOCIN) IVPB 1000 mg/200 mL premix  Status:  Discontinued     1,000 mg 200 mL/hr over 60 Minutes Intravenous  Once 08/21/17 1451 08/21/17 1525      Assessment/Plan:  1 - Gross Hematuria - likely from Rt ureteral stone + UTI.   2 - Right Ureteral + Renal Stones - now temporized with neph tube. Will need antegrade ureteroscopy  In elective setting in few weeks after this hospitalization. NO further intervention this hospitalization.   Plan to keep neph tube at discharge.   3 - Bacteruria - agree with current ABX pending further CX data. This may or may not be source of neutropenic fever. Rec at least 2 week total course pending CX results.   4 - Prostate Screening - up to date this year.   I will follow pt PRN going forward in house this  admission as I am leaving town. My on call partners have received checkout if any urgent questions arise.  We will arrange Urology follow up in office.   [facility], Carletta Feasel 08/22/2017

## 2017-08-22 NOTE — Progress Notes (Signed)
Notified MD Dahlstedt with Urology in regards to patient having to lie flat on stomach during CT. MD Dahlstedt said it was okay for patient to lie on stomach even with nephrostomy tube.

## 2017-08-22 NOTE — Progress Notes (Signed)
CRITICAL VALUE ALERT  Critical Value:  Troponin=0.98, LA= 3.6  Date & Time Notied:  08/22/17    Provider Notified: Yes  Orders Received/Actions taken: Repeat LA. ,serial Troponins.

## 2017-08-22 NOTE — Progress Notes (Signed)
PULMONARY / CRITICAL CARE MEDICINE   Name: Cory Ibarra MRN: [id number] DOB: 03/27/1961    ADMISSION DATE:  08/21/2017 CONSULTATION DATE:  08/21/2017  REFERRING MD:  Dr. Elon Jester  CHIEF COMPLAINT:  Low blood pressure  HISTORY OF PRESENT ILLNESS:   56 yo male presented with fever, n/v, hematuria, diarrhea.  Found to have multiple renal calculi with Rt ureteral calculus.  Required pressors in setting of septic shock and PCCM asked to assess.  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  SUBJECTIVE:  Nausea better.  Denies chest/abdominal pain.  Remains on pressors.  VITAL SIGNS: BP 122/81   Pulse 94   Temp 98.2 F (36.8 C) (Oral)   Resp 20   Ht 6\' 2"  (1.88 m)   Wt 168 lb 14 oz (76.6 kg)   SpO2 100%   BMI 21.68 kg/m   INTAKE / OUTPUT: I/O last 3 completed shifts: In: 11491.9 [I.V.:8926.9; Other:15; IV Piggyback:2550] Out: 150 [Urine:100; Drains:50]  PHYSICAL EXAMINATION:  General - pale Eyes - pupils reactive ENT - no sinus tenderness, no oral exudate, no LAN Cardiac - regular, no murmur Chest - no wheeze, rales, Rt chest port site clean Abd - soft, non tender, Rt nephrostomy in place Ext - no edema Skin - no rashes Neuro - normal strength   LABS:  BMET  Recent Labs Lab 08/16/17 0924 08/21/17 1316 08/22/17 0332  NA 135* 135 134*  K 3.6 3.5 3.2*  CL  --  103 107  CO2 25 22 14*  BUN 10.4 12 16   CREATININE 0.6* 0.63 0.75  GLUCOSE 83 144* 183*    Electrolytes  Recent Labs Lab 08/16/17 0924 08/16/17 0924 08/21/17 1316 08/21/17 2249 08/22/17 0332  CALCIUM 8.1*  --  6.9*  --  5.9*  MG 2.3  --   --  1.4* 1.8  PHOS  --  1.2* 1.9*  --  3.4    CBC  Recent Labs Lab 08/21/17 1316 08/21/17 2249 08/22/17 0332  WBC 0.1* <0.1* 0.1*  HGB 9.8* 8.1* 8.5*  HCT 27.6* 22.6* 24.2*  PLT 76* 58* 70*    Coag's  Recent Labs Lab 08/21/17 1316  INR 1.23    Sepsis Markers  Recent Labs Lab  08/21/17 1655 08/21/17 2249 08/22/17 0332 08/22/17 0414  LATICACIDVEN 2.6* 3.6*  --  3.3*  PROCALCITON  --  67.99 69.28  --     ABG  Recent Labs Lab 08/21/17 1850  PHART 7.470*  PCO2ART 20.3*  PO2ART 92.8    Liver Enzymes  Recent Labs Lab 08/16/17 0924 08/21/17 1316  AST 27 30  ALT 59* 41  ALKPHOS 110 112  BILITOT 0.89 2.2*  ALBUMIN 3.4* 3.1*    Cardiac Enzymes  Recent Labs Lab 08/21/17 2249 08/22/17 0332  TROPONINI 0.98* 0.88*    Glucose  Recent Labs Lab 08/21/17 2338 08/22/17 0312 08/22/17 0750  GLUCAP 105* 179* 120*    Imaging Dg Chest 2 View  Result Date: 08/21/2017 CLINICAL DATA:  Dizzy in short of breath EXAM: CHEST  2 VIEW COMPARISON:  06/28/2017 FINDINGS: Right jugular Port-A-Cath placed with its tip at the cavoatrial junction. No pneumothorax. Lungs are hyperaerated and clear. Normal heart size. No pleural effusion. IMPRESSION: No active cardiopulmonary disease. Electronically Signed   By: Marybelle Killings M.D.   On: 08/21/2017 13:50   Dg Thoracic Spine 2 View  Result Date: 08/21/2017 CLINICAL DATA:  Pain. History of lower B-cell non-Hodgkin's lymphoma EXAM:  THORACIC SPINE 3 VIEWS COMPARISON:  MR thoracic spine June 28, 2017 and chest radiograph August 21, 2017 FINDINGS: Frontal, lateral, and swimmer's views obtained. There is no evident acute fracture or spondylolisthesis. The pedicle at T12 on the right is no longer apparent and is presumably destroyed by neoplasm. This finding was evident on prior MR. there is subtle loss of detail along the lateral left T8 pedicle, noted by prior MR to be involved with tumor. There is no obvious neoplastic involvement vertebral bodies by radiography. MR shows tumor involving several vertebral bodies. IMPRESSION: No fracture or spondylolisthesis. There are multiple metastatic foci seen in the thoracic spine performed three months prior. By radiography, there is loss of the pedicle of T12 on the right and subtle  loss of the lateral aspect of the pedicle of T8 on the left. 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 No other neoplastic foci identified by radiography the bony structures. Note that there are multiple foci of tumor and thoracic vertebral bodies seen on recent MR not seen by radiography currently. Electronically Signed   By: Lowella Grip III M.D.   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 Electronically Signed   By: Lowella Grip III M.D.   On: 08/21/2017 16:02   Result Date: 08/21/2017 CLINICAL DATA:  Pain. History of large B-cell non-Hodgkin's lymphoma EXAM: LUMBAR SPINE - COMPLETE 4+ VIEW COMPARISON:  Lumbar MRI June 28, 2017 FINDINGS: Frontal, lateral, spot lumbosacral lateral, and bilateral oblique views were obtained. There are 5 non-rib-bearing lumbar type vertebral bodies. There is a stable fracture of the L3 vertebral body, more severe along the leftward aspect. There is lucency in this area suggesting metastatic focus as likely etiology for the fracture. There is apparent destruction in the region of the pedicle on the left at L3. No other fracture evident. No spondylolisthesis. Disc spaces appear normal. No other lytic or destructive lesions. There is a probable bone island medial upper right iliac crest. There is facet osteoarthritic change at L4-5 and L5-S1 bilaterally. IMPRESSION: There is again noted fracture L3 vertebral body. Tumor extends into and involves the left L3 pedicle. This finding was present on prior lumbar MR. No new bone lesions are evident. No new fracture. No spondylolisthesis. There is facet osteoarthritic change at L4-5 L5-S1 bilaterally. Electronically Signed: By: Lowella Grip III M.D. On: 08/21/2017 15:52   Dg C-arm 1-60 Min-no Report  Result  Date: 08/21/2017 Fluoroscopy was utilized by the requesting physician.  No radiographic interpretation.   Ir Nephrostomy Placement Right  Result Date: 08/22/2017 INDICATION: History of lymphoma, admitted with neutropenic fever in urosepsis secondary to right-sided ureteral stone. Post failed attempted right-sided ureteral stent placement. As such, request made for placement of a right-sided percutaneous nephrostomy catheter for infection source control purposes. EXAM: 1. ULTRASOUND GUIDANCE FOR PUNCTURE OF THE RIGHT RENAL COLLECTING SYSTEM 2. RIGHT PERCUTANEOUS NEPHROSTOMY TUBE PLACEMENT. COMPARISON:  CT abdomen pelvis - 07/30/2017; right-sided retrograde pyelogram and attempted stent placement - earlier same day MEDICATIONS: The patient is currently admitted to the hospital and receiving intravenous antibiotics.;The antibiotic was administered in an appropriate time frame prior to skin puncture. ANESTHESIA/SEDATION: Moderate (conscious) sedation was employed during this procedure. A total of Versed 1 mg and Fentanyl 75 mcg was administered intravenously. Moderate Sedation Time: 5  minutes. The patient's level of consciousness and vital signs were monitored continuously by radiology nursing throughout the procedure under my direct supervision. CONTRAST:  20 mL Isovue 300 administered into the collecting system FLUOROSCOPY TIME:  1 minute 6 seconds (40.9 mGy) COMPLICATIONS: None immediate. PROCEDURE: The procedure, risks, benefits, and alternatives were explained to the patient. Questions regarding the procedure were encouraged and answered. The patient understands and consents to the procedure. A timeout was performed prior to the initiation of the procedure. The right flank region was prepped with Betadine in a sterile fashion, and a sterile drape was applied covering the operative field. A sterile gown and sterile gloves were used for the procedure. Local anesthesia was provided with 1% Lidocaine with  epinephrine. Ultrasound was used to localize the right kidney. Under direct ultrasound guidance, a 21 gauge needle was advanced into the renal collecting system. An ultrasound image documentation was performed. Access within the collecting system was confirmed with the efflux of urine followed by contrast injection. Over a Nitrex wire, the inner three [place] catheter of an Accustick set was advanced into the renal collecting system. Contrast injection was injected into the collecting system as several spot radiographs were obtained in various obliquities confirming puncture within a posterior inferior calix. As such, the tract was dilated with an Accustick stent. Over a guide wire, a 10-French percutaneous nephrostomy catheter was advanced into the collecting system where the coil was formed and locked. Contrast was injected and several sport radiographs were obtained in various obliquities confirming access. The catheter was secured at the skin with a Prolene retention suture and a gravity bag was placed. A dressing was placed. The patient tolerated procedure well without immediate postprocedural complication. FINDINGS: Ultrasound scanning demonstrates a moderately dilated right renal collecting system. Under direct ultrasound guidance, a posterior inferior calix was targeted allowing advancement of an 10-French percutaneous nephrostomy catheter under intermittent fluoroscopic guidance. Contrast injection confirmed appropriate positioning. Note is again made of an occlusive stone within the distal aspect of the right ureter. IMPRESSION: Successful ultrasound and fluoroscopic guided placement of a right sided 10 French PCN. This stone may be use for subsequent percutaneous nephrolithotomy at a later date as clinically indicated. Electronically Signed   By: Sandi Mariscal M.D.   On: 08/22/2017 07:59     STUDIES:   CULTURES: Blood 9/18 >> E coli C diff 9/18 >>  GI PCR 9/18 >> Urine 9/18 >>    ANTIBIOTICS: Vancomycin 9/18 >> 9/19 Zosyn 9/18 >> 9/19 Rocephin 9/19 >>   SIGNIFICANT EVENTS: 9/18 Admit, urology consulted >> Rt cystoscopy with retrograde pyelogram; unsuccessful stent attempt >> Rt nephrostomy by IR; oncology consulted  LINES/TUBES: Rt chest port 8/23 >>  Rt nephrostomy 9/18 >>   DISCUSSION: 56 yo male with septic shock from rt ureteral stone with E coli bacteremia in setting of chemotherapy induced pancytopenia with hx of B cell lymphoma.  ASSESSMENT / PLAN:  Septic shock in setting of ureteral stone with hematuria and E coli bacteremia. - continue IV fluids - pressors to keep MAP > 65  Lactic acidosis from septic shock. - continue hemodynamic support - f/u lactic acid  Dx with B cell lymphoma in July 2018 with last chemo 08/09/17. Chemotherapy induced pancytopenia. - monitor blood counts - granix per oncology  Relative adrenal insufficiency. - on prednisone as outpt with lymphoma chemotherapy - continue solucortef  Hypokalemia. Hypomagnesemia. - replace as needed  Elevated troponin >> demand ischemia. - monitor hemodynamics   DVT prophylaxis - SCDs  SUP - on protonix as outpt >> continue Nutrition - regular diet Goals of care - no CPR, but otherwise full resuscitation  Updated family at bedside  CC time 31 minutes  Chesley Mires, MD [place] 08/22/2017, 9:41 AM Pager:  [phone] After 3pm call: [phone]

## 2017-08-22 NOTE — Care Management Note (Signed)
Case Management Note  Patient Details  Name: Cory Ibarra MRN: [id number] Date of Birth: 08-Mar-1961  Subjective/Objective:     sepsis               Action/Plan: Date:  August 22, 2017 Chart reviewed for concurrent status and case management needs. Will continue to follow patient progress. Discharge Planning: following for needs Expected discharge date: [id number] Velva Harman, BSN, [place], [place]  Expected Discharge Date:   (unknown)               Expected Discharge Plan:  Home/Self Care  In-House Referral:     Discharge planning Services  CM Consult  Post Acute Care Choice:    Choice offered to:     DME Arranged:    DME Agency:     HH Arranged:    [place] Agency:     Status of Service:  In process, will continue to follow  If discussed at Long Length of Stay Meetings, dates discussed:    Additional Comments:  Leeroy Cha, RN 08/22/2017, 8:58 AM

## 2017-08-22 NOTE — Progress Notes (Signed)
[name] Progress Note Patient Name: Cory Ibarra DOB: 1961/05/23 MRN: [id number]   Date of Service  08/22/2017  HPI/Events of Note    eICU Interventions  Hypokalemia/ hypocalcemia -repleted      Intervention Category Intermediate Interventions: Electrolyte abnormality - evaluation and management  ALVA,RAKESH V. 08/22/2017, 5:06 AM

## 2017-08-22 NOTE — Consult Note (Signed)
IP PROGRESS NOTE Requesting provider: Dr Doreatha Lew  Subjective:  Last night, patient has developed progressive hypotension entering a state of septic shock. Currently receiving IV fluid support and pressors. Also started on stress dose steroids. Patient reports feeling significantly better today with improvement in mentation, no recurrence of diarrhea, and good urine output after placement of percutaneous nephrostomy in the right side due to presence of upper urinary tract infection setting of obstruction by a ureteral stone. Blood pressure and heart rate have stabilized significantly since yesterday. Patient was also started on Neupogen. Patient denies any new complaints at this time.  Objective: Vital signs in last 24 hours: Blood pressure 119/64, pulse 87, temperature 98.1 F (36.7 C), temperature source Oral, resp. rate (!) 28, height 6\' 2"  (1.88 m), weight 168 lb 14 oz (76.6 kg), SpO2 100 %.  Intake/Output from previous day: 09/18 0701 - 09/19 0700 In: 16109 [I.V.:9132; IV Piggyback:2550] Out: 150 [Urine:100; Drains:50]  Physical Exam: Gen: Patient is alert, awake oriented 3, remarkably improved compared to yesterday. HEENT: Unremarkable Lungs: clear to auscultation anteriorly bilaterally. Cardiac: Regular, S1/S2. No murmurs Abdomen: Abdomen soft, nontender nondistended. Her distention has resolved. Percutaneous nephrostomy on the right side is in place draining clear but darkly colored urine. Extremities: No peripheral edema   Portacath/PICC-without erythema  Lab Results:  Recent Labs  08/21/17 2249 08/22/17 0332  WBC <0.1* 0.1*  HGB 8.1* 8.5*  HCT 22.6* 24.2*  PLT 58* 70*    BMET  Recent Labs  08/21/17 1316 08/22/17 0332  NA 135 134*  K 3.5 3.2*  CL 103 107  CO2 22 14*  GLUCOSE 144* 183*  BUN 12 16  CREATININE 0.63 0.75  CALCIUM 6.9* 5.9*    Lab Results  Component Value Date   CEA1 0.7 06/28/2017    Studies/Results: Dg Chest 2  View  Result Date: 08/21/2017 CLINICAL DATA:  Dizzy in short of breath EXAM: CHEST  2 VIEW COMPARISON:  06/28/2017 FINDINGS: Right jugular Port-A-Cath placed with its tip at the cavoatrial junction. No pneumothorax. Lungs are hyperaerated and clear. Normal heart size. No pleural effusion. IMPRESSION: No active cardiopulmonary disease. Electronically Signed   By: Marybelle Killings M.D.   On: 08/21/2017 13:50   Dg Thoracic Spine 2 View  Result Date: 08/21/2017 CLINICAL DATA:  Pain. History of lower B-cell non-Hodgkin's lymphoma EXAM: THORACIC SPINE 3 VIEWS COMPARISON:  MR thoracic spine June 28, 2017 and chest radiograph August 21, 2017 FINDINGS: Frontal, lateral, and swimmer's views obtained. There is no evident acute fracture or spondylolisthesis. The pedicle at T12 on the right is no longer apparent and is presumably destroyed by neoplasm. This finding was evident on prior MR. there is subtle loss of detail along the lateral left T8 pedicle, noted by prior MR to be involved with tumor. There is no obvious neoplastic involvement vertebral bodies by radiography. MR shows tumor involving several vertebral bodies. IMPRESSION: No fracture or spondylolisthesis. There are multiple metastatic foci seen in the thoracic spine performed three months prior. By radiography, there is loss of the pedicle of T12 on the right and subtle loss of the lateral aspect of the pedicle of T8 on the left. 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 No other neoplastic foci identified by radiography the bony structures. Note that there are multiple foci of tumor and thoracic vertebral bodies seen on recent MR not seen by radiography currently. Electronically Signed   By: Lowella Grip III M.D.   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 Electronically Signed   By: Lowella Grip III M.D.   On: 08/21/2017 16:02   Result Date: 08/21/2017 CLINICAL DATA:  Pain. History of large B-cell non-Hodgkin's lymphoma EXAM: LUMBAR SPINE - COMPLETE 4+ VIEW COMPARISON:  Lumbar MRI June 28, 2017 FINDINGS: Frontal, lateral, spot lumbosacral lateral, and bilateral oblique views were obtained. There are 5 non-rib-bearing lumbar type vertebral bodies. There is a stable fracture of the L3 vertebral body, more severe along the leftward aspect. There is lucency in this area suggesting metastatic focus as likely etiology for the fracture. There is apparent destruction in the region of the pedicle on the left at L3. No other fracture evident. No spondylolisthesis. Disc spaces appear normal. No other lytic or destructive lesions. There is a probable bone island medial upper right iliac crest. There is facet osteoarthritic change at L4-5 and L5-S1 bilaterally. IMPRESSION: There is again noted fracture L3 vertebral body. Tumor extends into and involves the left L3 pedicle. This finding was present on prior lumbar MR. No new bone lesions are evident. No new fracture. No spondylolisthesis. There is facet osteoarthritic change at L4-5 L5-S1 bilaterally. Electronically Signed: By: Lowella Grip III M.D. On: 08/21/2017 15:52   Dg C-arm 1-60 Min-no Report  Result Date: 08/21/2017 Fluoroscopy was utilized by the requesting physician.  No radiographic interpretation.   Ir Nephrostomy Placement Right  Result Date: 08/22/2017 INDICATION: History of lymphoma, admitted with neutropenic fever in urosepsis secondary to right-sided ureteral stone. Post failed attempted right-sided ureteral stent placement. As such, request made for placement of a right-sided percutaneous nephrostomy catheter for infection source control purposes. EXAM: 1. ULTRASOUND GUIDANCE FOR PUNCTURE OF THE RIGHT RENAL COLLECTING SYSTEM 2. RIGHT PERCUTANEOUS NEPHROSTOMY TUBE PLACEMENT.  COMPARISON:  CT abdomen pelvis - 07/30/2017; right-sided retrograde pyelogram and attempted stent placement - earlier same day MEDICATIONS: The patient is currently admitted to the hospital and receiving intravenous antibiotics.;The antibiotic was administered in an appropriate time frame prior to skin puncture. ANESTHESIA/SEDATION: Moderate (conscious) sedation was employed during this procedure. A total of Versed 1 mg and Fentanyl 75 mcg was administered intravenously. Moderate Sedation Time: 5 minutes. The patient's level of consciousness and vital signs were monitored continuously by radiology nursing throughout the procedure under my direct supervision. CONTRAST:  20 mL Isovue 300 administered into the collecting system FLUOROSCOPY TIME:  1 minute 6 seconds (06.2 mGy) COMPLICATIONS: None immediate. PROCEDURE: The procedure, risks, benefits, and alternatives were explained to the patient. Questions regarding the procedure were encouraged and answered. The patient understands and consents to the procedure. A timeout was performed prior to the initiation of the procedure. The right flank region was prepped with Betadine in a sterile fashion, and a sterile drape was applied covering the operative field. A sterile gown and sterile gloves were used for the procedure. Local anesthesia was provided with 1% Lidocaine with epinephrine. Ultrasound was used to localize the right kidney. Under direct ultrasound guidance, a 21 gauge needle was advanced into the renal collecting system. An ultrasound image documentation was performed. Access within the collecting system was confirmed with the efflux of urine followed by contrast injection. Over a Nitrex wire, the inner three [place] catheter of an Accustick set was advanced into the renal collecting system. Contrast injection was injected into the  collecting system as several spot radiographs were obtained in various obliquities confirming puncture within a posterior inferior  calix. As such, the tract was dilated with an Accustick stent. Over a guide wire, a 10-French percutaneous nephrostomy catheter was advanced into the collecting system where the coil was formed and locked. Contrast was injected and several sport radiographs were obtained in various obliquities confirming access. The catheter was secured at the skin with a Prolene retention suture and a gravity bag was placed. A dressing was placed. The patient tolerated procedure well without immediate postprocedural complication. FINDINGS: Ultrasound scanning demonstrates a moderately dilated right renal collecting system. Under direct ultrasound guidance, a posterior inferior calix was targeted allowing advancement of an 10-French percutaneous nephrostomy catheter under intermittent fluoroscopic guidance. Contrast injection confirmed appropriate positioning. Note is again made of an occlusive stone within the distal aspect of the right ureter. IMPRESSION: Successful ultrasound and fluoroscopic guided placement of a right sided 10 French PCN. This stone may be use for subsequent percutaneous nephrolithotomy at a later date as clinically indicated. Electronically Signed   By: Sandi Mariscal M.D.   On: 08/22/2017 07:59    Medications: I have reviewed the patient's current medications.  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 Currently, right kidneys drained by percutaneous nephrostomy. Patient is significantly more stable, but still requiring pressor support.  Recommendations: --Management off therapy, pressor support, and fluid replacement by primary service --Continue Neupogen 480 g daily until [place] is 2000 for 2 days in a row or => 5000  I will continue following the patient daily. Please don't hesitate to contact me  directly at [phone] if additional input is needed.   LOS: 1 day   Ardath Sax, MD   08/22/2017, 5:47 PM

## 2017-08-23 ENCOUNTER — Inpatient Hospital Stay: Admission: RE | Admit: 2017-08-23 | Payer: Self-pay | Source: Ambulatory Visit | Admitting: Radiation Oncology

## 2017-08-23 DIAGNOSIS — R6521 Severe sepsis with septic shock: Secondary | ICD-10-CM

## 2017-08-23 DIAGNOSIS — A4151 Sepsis due to Escherichia coli [E. coli]: Secondary | ICD-10-CM

## 2017-08-23 LAB — BASIC METABOLIC PANEL
Anion gap: 9 (ref 5–15)
BUN: 18 mg/dL (ref 6–20)
CALCIUM: 6 mg/dL — AB (ref 8.9–10.3)
CHLORIDE: 105 mmol/L (ref 101–111)
CO2: 20 mmol/L — AB (ref 22–32)
CREATININE: 0.46 mg/dL — AB (ref 0.61–1.24)
GFR calc Af Amer: >60 mL/min (ref >60)
GFR calc non Af Amer: >60 mL/min (ref >60)
GLUCOSE: 102 mg/dL — AB (ref 65–99)
Potassium: 3.1 mmol/L — ABNORMAL LOW (ref 3.5–5.1)
Sodium: 134 mmol/L — ABNORMAL LOW (ref 135–145)

## 2017-08-23 LAB — URINE CULTURE: Culture: NO GROWTH

## 2017-08-23 LAB — CBC WITH DIFFERENTIAL/PLATELET
BASOS PCT: 0 %
Basophils Absolute: 0 10*3/uL (ref 0.0–0.1)
EOS ABS: 0 10*3/uL (ref 0.0–0.7)
Eosinophils Relative: 0 %
HEMATOCRIT: 21 % — AB (ref 39.0–52.0)
HEMOGLOBIN: 7.3 g/dL — AB (ref 13.0–17.0)
LYMPHS PCT: 5 %
Lymphs Abs: 0 10*3/uL — ABNORMAL LOW (ref 0.7–4.0)
MCH: 25.4 pg — AB (ref 26.0–34.0)
MCHC: 34.8 g/dL (ref 30.0–36.0)
MCV: 73.2 fL — AB (ref 78.0–100.0)
MONO ABS: 0.2 10*3/uL (ref 0.1–1.0)
Monocytes Relative: 30 %
NEUTROS ABS: 0.6 10*3/uL — AB (ref 1.7–7.7)
NEUTROS PCT: 65 %
Platelets: 42 10*3/uL — ABNORMAL LOW (ref 150–400)
RBC: 2.87 MIL/uL — ABNORMAL LOW (ref 4.22–5.81)
RDW: 18.2 % — AB (ref 11.5–15.5)
WBC: 0.8 10*3/uL — CL (ref 4.0–10.5)

## 2017-08-23 LAB — PROCALCITONIN: PROCALCITONIN: 33.06 ng/mL

## 2017-08-23 LAB — CORTISOL

## 2017-08-23 LAB — MAGNESIUM: Magnesium: 1.7 mg/dL (ref 1.7–2.4)

## 2017-08-23 LAB — LACTIC ACID, PLASMA: LACTIC ACID, VENOUS: 1.9 mmol/L (ref 0.5–1.9)

## 2017-08-23 LAB — ALBUMIN: Albumin: 2.3 g/dL — ABNORMAL LOW (ref 3.5–5.0)

## 2017-08-23 MED ORDER — POTASSIUM CHLORIDE 10 MEQ/50ML IV SOLN
10.0000 meq | INTRAVENOUS | Status: AC
  Administered 2017-08-23 (×6): 10 meq via INTRAVENOUS
  Filled 2017-08-23 (×6): qty 50

## 2017-08-23 MED ORDER — CHLORHEXIDINE GLUCONATE CLOTH 2 % EX PADS
6.0000 | MEDICATED_PAD | Freq: Every day | CUTANEOUS | Status: DC
  Administered 2017-08-24 – 2017-09-02 (×6): 6 via TOPICAL

## 2017-08-23 MED ORDER — SODIUM CHLORIDE 0.9% FLUSH
10.0000 mL | INTRAVENOUS | Status: DC | PRN
  Administered 2017-08-28 – 2017-08-31 (×4): 10 mL
  Filled 2017-08-23 (×4): qty 40

## 2017-08-23 MED ORDER — MAGNESIUM SULFATE 2 GM/50ML IV SOLN
2.0000 g | Freq: Once | INTRAVENOUS | Status: AC
  Administered 2017-08-23: 2 g via INTRAVENOUS
  Filled 2017-08-23: qty 50

## 2017-08-23 MED ORDER — SODIUM CHLORIDE 0.9% FLUSH
10.0000 mL | Freq: Two times a day (BID) | INTRAVENOUS | Status: DC
  Administered 2017-08-23 – 2017-09-01 (×6): 10 mL

## 2017-08-23 NOTE — Consult Note (Signed)
IP PROGRESS NOTE Requesting provider: Dr Doreatha Lew  Subjective:  Patient continues to feel better subjectively compared to admission. No recurrent fevers. Currently off pressor medications and maintaining blood pressure and heart rate in the normal range. Small bowel movement yesterday. Review of laboratory demonstrates improving neutrophil count.  Objective: Vital signs in last 24 hours: Blood pressure 121/72, pulse 88, temperature 98.1 F (36.7 C), temperature source Oral, resp. rate (!) 27, height 6\' 2"  (1.88 m), weight 168 lb 14 oz (76.6 kg), SpO2 95 %.  Intake/Output from previous day: 09/19 0701 - 09/20 0700 In: 3572.6 [P.O.:120; I.V.:3397.6; IV Piggyback:50] Out: 960 [Urine:600; Drains:360]  Physical Exam: Gen: Patient is alert, awake oriented 3, remarkably improved compared to yesterday. HEENT: Unremarkable Lungs: clear to auscultation anteriorly bilaterally. Cardiac: Regular, S1/S2. No murmurs Abdomen: Abdomen soft, nontender nondistended. Her distention has resolved. Percutaneous nephrostomy on the right side is in place draining clear but darkly colored urine. Extremities: No peripheral edema   Portacath/PICC-without erythema  Lab Results:  Recent Labs  08/22/17 0332 08/23/17 0434  WBC 0.1* 0.8*  HGB 8.5* 7.3*  HCT 24.2* 21.0*  PLT 70* 42*    BMET  Recent Labs  08/22/17 0332 08/23/17 0434  NA 134* 134*  K 3.2* 3.1*  CL 107 105  CO2 14* 20*  GLUCOSE 183* 102*  BUN 16 18  CREATININE 0.75 0.46*  CALCIUM 5.9* 6.0*    Lab Results  Component Value Date   CEA1 0.7 06/28/2017    Studies/Results: Dg Chest 2 View  Result Date: 08/21/2017 CLINICAL DATA:  Dizzy in short of breath EXAM: CHEST  2 VIEW COMPARISON:  06/28/2017 FINDINGS: Right jugular Port-A-Cath placed with its tip at the cavoatrial junction. No pneumothorax. Lungs are hyperaerated and clear. Normal heart size. No pleural effusion. IMPRESSION: No active cardiopulmonary disease.  Electronically Signed   By: Marybelle Killings M.D.   On: 08/21/2017 13:50   Dg Thoracic Spine 2 View  Result Date: 08/21/2017 CLINICAL DATA:  Pain. History of lower B-cell non-Hodgkin's lymphoma EXAM: THORACIC SPINE 3 VIEWS COMPARISON:  MR thoracic spine June 28, 2017 and chest radiograph August 21, 2017 FINDINGS: Frontal, lateral, and swimmer's views obtained. There is no evident acute fracture or spondylolisthesis. The pedicle at T12 on the right is no longer apparent and is presumably destroyed by neoplasm. This finding was evident on prior MR. there is subtle loss of detail along the lateral left T8 pedicle, noted by prior MR to be involved with tumor. There is no obvious neoplastic involvement vertebral bodies by radiography. MR shows tumor involving several vertebral bodies. IMPRESSION: No fracture or spondylolisthesis. There are multiple metastatic foci seen in the thoracic spine performed three months prior. By radiography, there is loss of the pedicle of T12 on the right and subtle loss of the lateral aspect of the pedicle of T8 on the left. 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 No other neoplastic foci identified by radiography the bony structures. Note that there are multiple foci of tumor and thoracic vertebral bodies seen on recent MR not seen by radiography currently. Electronically Signed   By: Lowella Grip III M.D.   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 Electronically Signed   By: Lowella Grip III M.D.   On: 08/21/2017  16:02   Result Date: 08/21/2017 CLINICAL DATA:  Pain. History of large B-cell non-Hodgkin's lymphoma EXAM: LUMBAR SPINE - COMPLETE 4+ VIEW COMPARISON:  Lumbar MRI June 28, 2017 FINDINGS: Frontal, lateral, spot lumbosacral lateral, and bilateral  oblique views were obtained. There are 5 non-rib-bearing lumbar type vertebral bodies. There is a stable fracture of the L3 vertebral body, more severe along the leftward aspect. There is lucency in this area suggesting metastatic focus as likely etiology for the fracture. There is apparent destruction in the region of the pedicle on the left at L3. No other fracture evident. No spondylolisthesis. Disc spaces appear normal. No other lytic or destructive lesions. There is a probable bone island medial upper right iliac crest. There is facet osteoarthritic change at L4-5 and L5-S1 bilaterally. IMPRESSION: There is again noted fracture L3 vertebral body. Tumor extends into and involves the left L3 pedicle. This finding was present on prior lumbar MR. No new bone lesions are evident. No new fracture. No spondylolisthesis. There is facet osteoarthritic change at L4-5 L5-S1 bilaterally. Electronically Signed: By: Lowella Grip III M.D. On: 08/21/2017 15:52   Dg C-arm 1-60 Min-no Report  Result Date: 08/21/2017 Fluoroscopy was utilized by the requesting physician.  No radiographic interpretation.   Ir Nephrostomy Placement Right  Result Date: 08/22/2017 INDICATION: History of lymphoma, admitted with neutropenic fever in urosepsis secondary to right-sided ureteral stone. Post failed attempted right-sided ureteral stent placement. As such, request made for placement of a right-sided percutaneous nephrostomy catheter for infection source control purposes. EXAM: 1. ULTRASOUND GUIDANCE FOR PUNCTURE OF THE RIGHT RENAL COLLECTING SYSTEM 2. RIGHT PERCUTANEOUS NEPHROSTOMY TUBE PLACEMENT. COMPARISON:  CT abdomen pelvis - 07/30/2017; right-sided retrograde pyelogram and attempted stent placement - earlier same day MEDICATIONS: The patient is currently admitted to the hospital and receiving intravenous antibiotics.;The antibiotic was administered in an appropriate time frame prior to skin puncture. ANESTHESIA/SEDATION:  Moderate (conscious) sedation was employed during this procedure. A total of Versed 1 mg and Fentanyl 75 mcg was administered intravenously. Moderate Sedation Time: 5 minutes. The patient's level of consciousness and vital signs were monitored continuously by radiology nursing throughout the procedure under my direct supervision. CONTRAST:  20 mL Isovue 300 administered into the collecting system FLUOROSCOPY TIME:  1 minute 6 seconds (45.8 mGy) COMPLICATIONS: None immediate. PROCEDURE: The procedure, risks, benefits, and alternatives were explained to the patient. Questions regarding the procedure were encouraged and answered. The patient understands and consents to the procedure. A timeout was performed prior to the initiation of the procedure. The right flank region was prepped with Betadine in a sterile fashion, and a sterile drape was applied covering the operative field. A sterile gown and sterile gloves were used for the procedure. Local anesthesia was provided with 1% Lidocaine with epinephrine. Ultrasound was used to localize the right kidney. Under direct ultrasound guidance, a 21 gauge needle was advanced into the renal collecting system. An ultrasound image documentation was performed. Access within the collecting system was confirmed with the efflux of urine followed by contrast injection. Over a Nitrex wire, the inner three [place] catheter of an Accustick set was advanced into the renal collecting system. Contrast injection was injected into the collecting system as several spot radiographs were obtained in various obliquities confirming puncture within a posterior inferior calix. As such, the tract was dilated with an Accustick stent. Over a guide wire, a 10-French percutaneous nephrostomy catheter was advanced into the collecting system where the coil was formed and locked. Contrast was injected  and several sport radiographs were obtained in various obliquities confirming access. The catheter was  secured at the skin with a Prolene retention suture and a gravity bag was placed. A dressing was placed. The patient tolerated procedure well without immediate postprocedural complication. FINDINGS: Ultrasound scanning demonstrates a moderately dilated right renal collecting system. Under direct ultrasound guidance, a posterior inferior calix was targeted allowing advancement of an 10-French percutaneous nephrostomy catheter under intermittent fluoroscopic guidance. Contrast injection confirmed appropriate positioning. Note is again made of an occlusive stone within the distal aspect of the right ureter. IMPRESSION: Successful ultrasound and fluoroscopic guided placement of a right sided 10 French PCN. This stone may be use for subsequent percutaneous nephrolithotomy at a later date as clinically indicated. Electronically Signed   By: Sandi Mariscal M.D.   On: 08/22/2017 07:59    Medications: I have reviewed the patient's current medications.  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 Currently, right kidneys drained by percutaneous nephrostomy. Patient is significantly more stable, but still requiring pressor support. ANC up to 0.6 from 0.0 yesterday. Blood cultures from admission are both positive for E-coli and enterobacteriaceae spp. Patient has been transitioned to ceftriaxone based on available sensitivity information  Recommendations: --Management of antibiotic therapy, pressor support, and fluid replacement by primary service: While the patient remains neutropenic, do recommend continuing antibiotic coverage with an antibiotic that offers a more normal protection such as piperacillin/tazobactam, meropenem, or cefepime. --Continue Neupogen 480 g daily until [place] is 2000 for 2 days in a row or =>  5000 --Patient has somewhat progressive anemia with hemoglobin currently down to 7.3 and progressive thrombocytopenia with platelet count down to 42. Continue platelet drop is likely due to hemodilution when taking I/O's into account, bone marrow suppression due to septic shock. --Recommend transfusion support as follows: Use irradiated products only, Hgb <=7.0 -- transfuse 1U pRBC and re-check Hgb, Plt <= 20 -- transfuse 1 pack Plt  I will continue following the patient daily. Please don't hesitate to contact me directly at [phone] if additional input is needed.   LOS: 2 days   Ardath Sax, MD   08/23/2017, 9:37 AM

## 2017-08-23 NOTE — Progress Notes (Signed)
CRITICAL VALUE ALERT  Critical Value:  Calcium 6.0  Date & Time Notied: 08/23/17 @ 0530  Provider Notified: Flora Lipps  Orders Received/Actions taken: Albumin level ordered for calcium correction.

## 2017-08-23 NOTE — Anesthesia Postprocedure Evaluation (Signed)
Anesthesia Post Note  Patient: Cory Ibarra  Procedure(s) Performed: Procedure(s) (LRB): CYSTOSCOPY WITH RETROGRADE PYELOGRAM, RIGHT (Right)     Patient location during evaluation: SICU Anesthesia Type: General Level of consciousness: awake and alert Pain management: pain level controlled Vital Signs Assessment: vitals unstable and post-procedure vital signs reviewed and stable Respiratory status: patient connected to nasal cannula oxygen Cardiovascular status: blood pressure returned to baseline and unstable Postop Assessment: no apparent nausea or vomiting Anesthetic complications: no Comments: On high dose levophed. spetic    Last Vitals:  Vitals:   08/23/17 1700 08/23/17 1800  BP: 116/71 110/68  Pulse: 94 94  Resp: (!) 23 (!) 26  Temp:    SpO2: 99% (!) 89%    Last Pain:  Vitals:   08/23/17 1600  TempSrc: Oral  PainSc: 0-No pain   Pain Goal:                 Montez Hageman

## 2017-08-23 NOTE — Progress Notes (Signed)
PULMONARY / CRITICAL CARE MEDICINE   Name: Cory Ibarra MRN: [id number] DOB: 04-24-1961    ADMISSION DATE:  08/21/2017 CONSULTATION DATE:  08/21/2017  REFERRING MD:  Dr. Elon Jester  CHIEF COMPLAINT:  Low blood pressure  HISTORY OF PRESENT ILLNESS:   56 yo male presented with fever, n/v, hematuria, diarrhea.  Found to have multiple renal calculi with Rt ureteral calculus.  Required pressors in setting of septic shock and PCCM asked to assess.  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  SUBJECTIVE:  Feels much better  VITAL SIGNS: BP 121/72 (BP Location: Left Arm)   Pulse 88   Temp 98.1 F (36.7 C) (Oral)   Resp (!) 27   Ht 6\' 2"  (1.88 m)   Wt 168 lb 14 oz (76.6 kg)   SpO2 95%   BMI 21.68 kg/m  . cefTRIAXone (ROCEPHIN)  IV Stopped (08/22/17 1127)  . lactated ringers 100 mL/hr at 08/23/17 0700  . norepinephrine (LEVOPHED) Adult infusion 2 mcg/min (08/23/17 0700)  . potassium chloride Stopped (08/23/17 0859)  . vasopressin (PITRESSIN) infusion - *FOR SHOCK* 0.03 Units/min (08/23/17 0700)    INTAKE / OUTPUT:  Intake/Output Summary (Last 24 hours) at 08/23/17 0856 Last data filed at 08/23/17 0817  Gross per 24 hour  Intake          3605.24 ml  Output              960 ml  Net          2645.24 ml     PHYSICAL EXAMINATION: General appearance:  56 Year old  Male, well nourished/ cachectic  NAD, currently in acute distress, confused,  conversant  Eyes: anicteric sclerae, moist conjunctivae; PERRL, EOMI bilaterally. Mouth: moist membranes and no mucosal ulcerations;  Neck: Trachea midline; neck supple, no JVD Lungs/chest: CTA, with normal respiratory effort and no intercostal retractions CV: RRR, no MRGs  Abdomen: Soft, non-tender; no masses the right perc drain is patent and unremarkable Extremities: No peripheral edema Skin: Normal temperature, turgor and texture; no rash, ulcers Psych: Appropriate affect, alert and  oriented to person, place and time    LABS:  BMET  Recent Labs Lab 08/21/17 1316 08/22/17 0332 08/23/17 0434  NA 135 134* 134*  K 3.5 3.2* 3.1*  CL 103 107 105  CO2 22 14* 20*  BUN 12 16 18   CREATININE 0.63 0.75 0.46*  GLUCOSE 144* 183* 102*    Electrolytes  Recent Labs Lab 08/16/17 0924 08/21/17 1316 08/21/17 2249 08/22/17 0332 08/23/17 0434  CALCIUM  --  6.9*  --  5.9* 6.0*  MG  --   --  1.4* 1.8 1.7  PHOS 1.2* 1.9*  --  3.4  --     CBC  Recent Labs Lab 08/21/17 2249 08/22/17 0332 08/23/17 0434  WBC <0.1* 0.1* 0.8*  HGB 8.1* 8.5* 7.3*  HCT 22.6* 24.2* 21.0*  PLT 58* 70* 42*    Coag's  Recent Labs Lab 08/21/17 1316  INR 1.23    Sepsis Markers  Recent Labs Lab 08/21/17 2249 08/22/17 0332 08/22/17 0414 08/22/17 0836 08/23/17 0434  LATICACIDVEN 3.6*  --  3.3* 3.2* 1.9  PROCALCITON 67.99 69.28  --   --  33.06    ABG  Recent Labs Lab 08/21/17 1850  PHART 7.470*  PCO2ART 20.3*  PO2ART 92.8    Liver Enzymes  Recent Labs Lab 08/16/17 0924 08/21/17 1316 08/23/17 0448  AST 27 30  --  ALT 59* 41  --   ALKPHOS 110 112  --   BILITOT 0.89 2.2*  --   ALBUMIN 3.4* 3.1* 2.3*    Cardiac Enzymes  Recent Labs Lab 08/21/17 2249 08/22/17 0332  TROPONINI 0.98* 0.88*    Glucose  Recent Labs Lab 08/21/17 2338 08/22/17 0312 08/22/17 0750 08/22/17 1203 08/22/17 1641  GLUCAP 105* 179* 120* 122* 110*    Imaging No results found.   STUDIES:   CULTURES: Blood 9/18 >> E coli C diff 9/18 >>  GI PCR 9/18 >>neg Urine 9/18 >> mult orgs  ANTIBIOTICS: Vancomycin 9/18 >> 9/19 Zosyn 9/18 >> 9/19 Rocephin 9/19 >>   SIGNIFICANT EVENTS: 9/18 Admit, urology consulted >> Rt cystoscopy with retrograde pyelogram; unsuccessful stent attempt >> Rt nephrostomy by IR; oncology consulted  LINES/TUBES: Rt chest port 8/23 >>  Rt nephrostomy 9/18 >>   DISCUSSION: 56 yo male with septic shock from rt ureteral stone with E coli  bacteremia in setting of chemotherapy induced pancytopenia with hx of B cell lymphoma. Getting better Hope we can get him off pressors later today  ASSESSMENT / PLAN:  Septic shock in setting of ureteral stone with hematuria and E coli bacteremia. Now s/p right nephrostomy tube Pressor requirements-->down; just stopped vaspressin  -cont IVFs @ 100cc/hr -wean pressors for MAP > 65 -day 4 abx, day 2 rocephin  Lactic acidosis from septic shock.-->cleared w/ volume resuscitation  - continue hemodynamic support  Dx with B cell lymphoma in July 2018 with last chemo 08/09/17. Chemotherapy induced pancytopenia. -granix per onc -f/u cbc   Relative adrenal insufficiency. on prednisone as outpt with lymphoma chemotherapy -cont stress dose steroids  Hypokalemia. -repalce and recheck in am  Elevated troponin >> demand ischemia. -cont tele   DVT prophylaxis - SCDs SUP - on protonix as outpt >> continue Nutrition - regular diet Goals of care - no CPR, but otherwise full resuscitation  Updated family at bedside  My cct 43 min  Erick Colace ACNP-BC [place] Pager # [phone] OR # [phone] if no answer

## 2017-08-24 LAB — CULTURE, BLOOD (ROUTINE X 2)
SPECIAL REQUESTS: ADEQUATE
Special Requests: ADEQUATE

## 2017-08-24 LAB — CBC
HCT: 21.9 % — ABNORMAL LOW (ref 39.0–52.0)
Hemoglobin: 7.7 g/dL — ABNORMAL LOW (ref 13.0–17.0)
MCH: 25.3 pg — ABNORMAL LOW (ref 26.0–34.0)
MCHC: 35.2 g/dL (ref 30.0–36.0)
MCV: 72 fL — ABNORMAL LOW (ref 78.0–100.0)
PLATELETS: 48 10*3/uL — AB (ref 150–400)
RBC: 3.04 MIL/uL — ABNORMAL LOW (ref 4.22–5.81)
RDW: 18.3 % — AB (ref 11.5–15.5)
WBC: 2.8 10*3/uL — AB (ref 4.0–10.5)

## 2017-08-24 LAB — BASIC METABOLIC PANEL
ANION GAP: 9 (ref 5–15)
Anion gap: 10 (ref 5–15)
BUN: 11 mg/dL (ref 6–20)
BUN: 9 mg/dL (ref 6–20)
CALCIUM: 6.6 mg/dL — AB (ref 8.9–10.3)
CO2: 22 mmol/L (ref 22–32)
CO2: 23 mmol/L (ref 22–32)
CREATININE: 0.38 mg/dL — AB (ref 0.61–1.24)
Calcium: 6.3 mg/dL — CL (ref 8.9–10.3)
Chloride: 107 mmol/L (ref 101–111)
Chloride: 108 mmol/L (ref 101–111)
Creatinine, Ser: 0.35 mg/dL — ABNORMAL LOW (ref 0.61–1.24)
GFR calc Af Amer: >60 mL/min (ref >60)
Glucose, Bld: 93 mg/dL (ref 65–99)
Glucose, Bld: 97 mg/dL (ref 65–99)
POTASSIUM: 2.7 mmol/L — AB (ref 3.5–5.1)
Potassium: 3.4 mmol/L — ABNORMAL LOW (ref 3.5–5.1)
SODIUM: 139 mmol/L (ref 135–145)
SODIUM: 140 mmol/L (ref 135–145)

## 2017-08-24 LAB — CBC WITH DIFFERENTIAL/PLATELET
Basophils Absolute: 0 10*3/uL (ref 0.0–0.1)
Basophils Relative: 0 %
EOS ABS: 0 10*3/uL (ref 0.0–0.7)
Eosinophils Relative: 0 %
HCT: 24.9 % — ABNORMAL LOW (ref 39.0–52.0)
Hemoglobin: 8.7 g/dL — ABNORMAL LOW (ref 13.0–17.0)
Lymphocytes Relative: 4 %
Lymphs Abs: 0.2 10*3/uL — ABNORMAL LOW (ref 0.7–4.0)
MCH: 25.7 pg — ABNORMAL LOW (ref 26.0–34.0)
MCHC: 34.9 g/dL (ref 30.0–36.0)
MCV: 73.7 fL — ABNORMAL LOW (ref 78.0–100.0)
Monocytes Absolute: 0.6 10*3/uL (ref 0.1–1.0)
Monocytes Relative: 11 %
NEUTROS PCT: 85 %
Neutro Abs: 4.5 10*3/uL (ref 1.7–7.7)
PLATELETS: 57 10*3/uL — AB (ref 150–400)
RBC: 3.38 MIL/uL — AB (ref 4.22–5.81)
RDW: 18.5 % — AB (ref 11.5–15.5)
WBC: 5.3 10*3/uL (ref 4.0–10.5)

## 2017-08-24 MED ORDER — MAGNESIUM OXIDE 400 (241.3 MG) MG PO TABS
400.0000 mg | ORAL_TABLET | Freq: Two times a day (BID) | ORAL | Status: AC
  Administered 2017-08-24 (×2): 400 mg via ORAL
  Filled 2017-08-24 (×2): qty 1

## 2017-08-24 MED ORDER — SODIUM CHLORIDE 0.9 % IV SOLN
1.0000 g | Freq: Once | INTRAVENOUS | Status: AC
Start: 2017-08-24 — End: 2017-08-24
  Administered 2017-08-24: 1 g via INTRAVENOUS
  Filled 2017-08-24 (×2): qty 10

## 2017-08-24 MED ORDER — POTASSIUM CHLORIDE 10 MEQ/50ML IV SOLN
10.0000 meq | INTRAVENOUS | Status: AC
  Administered 2017-08-24 – 2017-08-25 (×4): 10 meq via INTRAVENOUS
  Filled 2017-08-24 (×4): qty 50

## 2017-08-24 MED ORDER — POTASSIUM CHLORIDE CRYS ER 20 MEQ PO TBCR
40.0000 meq | EXTENDED_RELEASE_TABLET | ORAL | Status: DC
  Administered 2017-08-24: 40 meq via ORAL
  Filled 2017-08-24: qty 2

## 2017-08-24 MED ORDER — POTASSIUM CHLORIDE 20 MEQ/15ML (10%) PO SOLN
40.0000 meq | Freq: Once | ORAL | Status: AC
  Administered 2017-08-24: 40 meq via ORAL
  Filled 2017-08-24: qty 30

## 2017-08-24 MED ORDER — HYDROCORTISONE NA SUCCINATE PF 100 MG IJ SOLR
50.0000 mg | Freq: Three times a day (TID) | INTRAMUSCULAR | Status: DC
  Administered 2017-08-24 – 2017-08-25 (×2): 50 mg via INTRAVENOUS
  Filled 2017-08-24 (×3): qty 2

## 2017-08-24 MED ORDER — POTASSIUM CHLORIDE 10 MEQ/50ML IV SOLN
10.0000 meq | INTRAVENOUS | Status: AC
  Administered 2017-08-24 (×6): 10 meq via INTRAVENOUS
  Filled 2017-08-24 (×6): qty 50

## 2017-08-24 NOTE — Progress Notes (Deleted)
PULMONARY / CRITICAL CARE MEDICINE   Name: Cory Ibarra MRN: [id number] DOB: 04-12-1961    ADMISSION DATE:  08/21/2017 CONSULTATION DATE:  08/21/2017  REFERRING MD:  Dr. Elon Jester  CHIEF COMPLAINT:  Low blood pressure  HISTORY OF PRESENT ILLNESS:   56 yo male presented with fever, n/v, hematuria, diarrhea.  Found to have multiple renal calculi with Rt ureteral calculus.  Required pressors in setting of septic shock and PCCM asked to assess.  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  SUBJECTIVE:  Up in chair  No distress.   VITAL SIGNS: BP 122/73   Pulse 92   Temp 98.1 F (36.7 C) (Oral)   Resp (!) 21   Ht 6\' 2"  (1.88 m)   Wt 168 lb 14 oz (76.6 kg)   SpO2 97%   BMI 21.68 kg/m  . cefTRIAXone (ROCEPHIN)  IV Stopped (08/23/17 1038)  . lactated ringers 100 mL/hr at 08/24/17 0600  . norepinephrine (LEVOPHED) Adult infusion Stopped (08/23/17 1300)  . potassium chloride 10 mEq (08/24/17 0831)    INTAKE / OUTPUT:  Intake/Output Summary (Last 24 hours) at 08/24/17 0932 Last data filed at 08/24/17 0800  Gross per 24 hour  Intake          2704.27 ml  Output             2450 ml  Net           254.27 ml     PHYSICAL EXAMINATION: General appearance:  56 Year old  Male, well nourished  NAD, conversant  Eyes: anicteric sclerae, moist conjunctivae; PERRL, EOMI bilaterally. Mouth: moist membranes and no mucosal ulcerations;  Neck: Trachea midline; neck supple, no JVD Lungs/chest: CTA, with normal respiratory effort and no intercostal retractions CV: RRR, no MRGs  Abdomen: Soft, non-tender; no masses drain w/ clear urine  Extremities: No peripheral edema Skin: Normal temperature, turgor and texture; no rash, ulcers Psych: Appropriate affect, alert and oriented to person, place and time     LABS:  BMET  Recent Labs Lab 08/22/17 0332 08/23/17 0434 08/24/17 0453  NA 134* 134* 139  K 3.2* 3.1* 2.7*  CL 107 105  107  CO2 14* 20* 22  BUN 16 18 11   CREATININE 0.75 0.46* 0.38*  GLUCOSE 183* 102* 93    Electrolytes  Recent Labs Lab 08/21/17 1316 08/21/17 2249 08/22/17 0332 08/23/17 0434 08/24/17 0453  CALCIUM 6.9*  --  5.9* 6.0* 6.3*  MG  --  1.4* 1.8 1.7  --   PHOS 1.9*  --  3.4  --   --     CBC  Recent Labs Lab 08/22/17 0332 08/23/17 0434 08/24/17 0453  WBC 0.1* 0.8* 2.8*  HGB 8.5* 7.3* 7.7*  HCT 24.2* 21.0* 21.9*  PLT 70* 42* 48*    Coag's  Recent Labs Lab 08/21/17 1316  INR 1.23    Sepsis Markers  Recent Labs Lab 08/21/17 2249 08/22/17 0332 08/22/17 0414 08/22/17 0836 08/23/17 0434  LATICACIDVEN 3.6*  --  3.3* 3.2* 1.9  PROCALCITON 67.99 69.28  --   --  33.06    ABG  Recent Labs Lab 08/21/17 1850  PHART 7.470*  PCO2ART 20.3*  PO2ART 92.8    Liver Enzymes  Recent Labs Lab 08/21/17 1316 08/23/17 0448  AST 30  --   ALT 41  --   ALKPHOS 112  --   BILITOT 2.2*  --   ALBUMIN 3.1* 2.3*  Cardiac Enzymes  Recent Labs Lab 08/21/17 2249 08/22/17 0332  TROPONINI 0.98* 0.88*    Glucose  Recent Labs Lab 08/21/17 2338 08/22/17 0312 08/22/17 0750 08/22/17 1203 08/22/17 1641  GLUCAP 105* 179* 120* 122* 110*    Imaging No results found.   STUDIES:   CULTURES: Blood 9/18 >> E coli C diff 9/18 >>  GI PCR 9/18 >>neg Urine 9/18 >> mult orgs  ANTIBIOTICS: Vancomycin 9/18 >> 9/19 Zosyn 9/18 >> 9/19 Rocephin 9/19 >>   SIGNIFICANT EVENTS: 9/18 Admit, urology consulted >> Rt cystoscopy with retrograde pyelogram; unsuccessful stent attempt >> Rt nephrostomy by IR; oncology consulted  LINES/TUBES: Rt chest port 8/23 >>  Rt nephrostomy 9/18 >>   DISCUSSION: 56 yo male with septic shock from rt ureteral stone with E coli bacteremia in setting of chemotherapy induced pancytopenia with hx of B cell lymphoma. Getting better To medsurg and [facility] again   ASSESSMENT / PLAN:  Septic shock in setting of ureteral stone with hematuria  and E coli bacteremia. Now s/p right nephrostomy tube Pressor requirements-->now off -decrease IVFs to 50cc/hr -Day 5 abx, day 3 rocephin  -move to medsurg  -nephrostomy tube per Urology and IR  Dx with B cell lymphoma in July 2018 with last chemo 08/09/17. Chemotherapy induced pancytopenia. -granix per onc -f/u cbc  Relative adrenal insufficiency. on prednisone as outpt with lymphoma chemotherapy -taper steroids  -eventually back to EOD decadron  Hypokalemia. -replace and recheck  Elevated troponin >> demand ischemia. Echo->w/ nml wall motion No pain - cont tele - consider out-pt cards eval when recovered  DVT prophylaxis - SCDs SUP - on protonix as outpt >> continue Nutrition - regular diet Goals of care - no CPR, but otherwise full resuscitation  Updated family at bedside   Erick Colace ACNP-BC [place] Pager # [phone] OR # [phone] if no answer

## 2017-08-24 NOTE — Progress Notes (Signed)
PULMONARY / CRITICAL CARE MEDICINE   Name: Cory Ibarra MRN: [id number] DOB: [birth year]    ADMISSION DATE:  08/21/2017 CONSULTATION DATE:  08/21/2017  REFERRING MD:  Dr. Elon Jester  CHIEF COMPLAINT:  Low blood pressure  HISTORY OF PRESENT ILLNESS:   56 yo male presented with fever, n/v, hematuria, diarrhea.  Found to have multiple renal calculi with Rt ureteral calculus.  Required pressors in setting of septic shock and PCCM asked to assess.  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  SUBJECTIVE:  Off pressors since yesterday.  Denies chest/abd pain.  Tolerating diet.  VITAL SIGNS: BP 122/73   Pulse 92   Temp 98.1 F (36.7 C) (Oral)   Resp (!) 21   Ht 6\' 2"  (1.88 m)   Wt 168 lb 14 oz (76.6 kg)   SpO2 97%   BMI 21.68 kg/m   INTAKE / OUTPUT: I/O last 3 completed shifts: In: 4650 [I.V.:3904; Other:10; IV Piggyback:450] Out: 2725 [Urine:1625; Drains:1100]  PHYSICAL EXAMINATION:  General - pleasant, sitting in chair Eyes - pupils reactive, wears glasses ENT - no sinus tenderness, no oral exudate, no LAN Cardiac - regular, no murmur Chest - no wheeze, rales Abd - soft, non tender, Rt PCN in place Ext - no edema Skin - no rashes Neuro - normal strength Psych - normal mood   LABS:  BMET  Recent Labs Lab 08/22/17 0332 08/23/17 0434 08/24/17 0453  NA 134* 134* 139  K 3.2* 3.1* 2.7*  CL 107 105 107  CO2 14* 20* 22  BUN 16 18 11   CREATININE 0.75 0.46* 0.38*  GLUCOSE 183* 102* 93    Electrolytes  Recent Labs Lab 08/21/17 1316 08/21/17 2249 08/22/17 0332 08/23/17 0434 08/24/17 0453  CALCIUM 6.9*  --  5.9* 6.0* 6.3*  MG  --  1.4* 1.8 1.7  --   PHOS 1.9*  --  3.4  --   --     CBC  Recent Labs Lab 08/22/17 0332 08/23/17 0434 08/24/17 0453  WBC 0.1* 0.8* 2.8*  HGB 8.5* 7.3* 7.7*  HCT 24.2* 21.0* 21.9*  PLT 70* 42* 48*    Coag's  Recent Labs Lab 08/21/17 1316  INR 1.23    Sepsis  Markers  Recent Labs Lab 08/21/17 2249 08/22/17 0332 08/22/17 0414 08/22/17 0836 08/23/17 0434  LATICACIDVEN 3.6*  --  3.3* 3.2* 1.9  PROCALCITON 67.99 69.28  --   --  33.06    ABG  Recent Labs Lab 08/21/17 1850  PHART 7.470*  PCO2ART 20.3*  PO2ART 92.8    Liver Enzymes  Recent Labs Lab 08/21/17 1316 08/23/17 0448  AST 30  --   ALT 41  --   ALKPHOS 112  --   BILITOT 2.2*  --   ALBUMIN 3.1* 2.3*    Cardiac Enzymes  Recent Labs Lab 08/21/17 2249 08/22/17 0332  TROPONINI 0.98* 0.88*    Glucose  Recent Labs Lab 08/21/17 2338 08/22/17 0312 08/22/17 0750 08/22/17 1203 08/22/17 1641  GLUCAP 105* 179* 120* 122* 110*    Imaging No results found.   CULTURES: Blood 9/18 >> E coli GI PCR 9/18 >> negative Urine 9/18 >> multiple organisms  ANTIBIOTICS: Vancomycin 9/18 >> 9/19 Zosyn 9/18 >> 9/19 Rocephin 9/19 >>   SIGNIFICANT EVENTS: 9/18 Admit, urology consulted >> Rt cystoscopy with retrograde pyelogram; unsuccessful stent attempt >> Rt nephrostomy by IR; oncology consulted 9/20 off pressors  LINES/TUBES: Rt chest port 8/23 >>  Rt nephrostomy 9/18 >>   DISCUSSION: 56 yo male with septic shock from rt ureteral stone with E coli bacteremia in setting of chemotherapy induced pancytopenia with hx of B cell lymphoma.  ASSESSMENT / PLAN:  Septic shock in setting of ureteral stone with hematuria and E coli bacteremia. - continue IV fluids - day 5 of Abx, currently on rocephin  Lactic acidosis from septic shock. - resolved  Chemotherapy induced pancytopenia. - f/u CBC  Relative adrenal insufficiency. - on prednisone as outpt with lymphoma chemotherapy - wean off solu cortef as able starting 9/21  Hypokalemia. Hypocalcemia. - replace as needed  Mild levated troponin >> demand ischemia in setting of septic shock. - monitor on telemetry  Diarrhea. - had prior to admission, but resolved - GI PCR negative - C diff PCR not sent >> doubt  he has C diff since diarrhea resolved and abdomen exam benign - will d/c contact isolation 9/21   DVT prophylaxis - SCDs SUP - on protonix as outpt >> continue Nutrition - regular diet Goals of care - no CPR, but otherwise full resuscitation  Updated family at bedside  Transfer to [facility] 9/21 >> To [facility] 9/22 and PCCM off.  Chesley Mires, MD [facility] Pulmonary/Critical Care 08/24/2017, 9:42 AM Pager:  [phone] After 3pm call: [phone]

## 2017-08-24 NOTE — Progress Notes (Signed)
CRITICAL VALUE ALERT  Critical Value:  Potassium 2.7 & Calcium 6.3  Date & Time Notied: 08/24/17 @ 0315  Provider Notified: Dr. Oletta Darter  Orders Received/Actions taken: Electrolytes replaced.

## 2017-08-24 NOTE — Progress Notes (Signed)
[place] Progress Note Patient Name: Cory Ibarra DOB: 01/19/1961 MRN: [id number]   Date of Service  08/24/2017  HPI/Events of Note  K+ = 2.7, Ca++ = 6.3, albumin = 2.3 and Creatinine = 0.38.  eICU Interventions  Will replace Ca++ and K+.     Intervention Category Major Interventions: Electrolyte abnormality - evaluation and management  Deen Deguia [place] 08/24/2017, 5:58 AM

## 2017-08-24 NOTE — Consult Note (Signed)
IP PROGRESS NOTE Requesting provider: Dr Doreatha Lew  Subjective:  Patient continues steady improvement. Currently is out of bed sitting in chair, feeling significantly stronger. No new fever spikes, blood pressures remain somewhat on the low side. Urine output has improved significantly compared to the previous days. No nausea, and diarrhea. No shortness of breath or chest pain.  Objective: Vital signs in last 24 hours: Blood pressure 122/73, pulse 92, temperature 98.1 F (36.7 C), temperature source Oral, resp. rate (!) 21, height 6\' 2"  (1.88 m), weight 168 lb 14 oz (76.6 kg), SpO2 97 %.  Intake/Output from previous day: 09/20 0701 - 09/21 0700 In: 2872.6 [I.V.:2467.6; IV Piggyback:400] Out: 2225 [Urine:1325; Drains:900]  Physical Exam: Gen: Patient is alert, awake oriented 3, remarkably improved compared to yesterday. HEENT: Unremarkable Lungs: clear to auscultation anteriorly bilaterally. Cardiac: Regular, S1/S2. No murmurs Abdomen: Abdomen soft, nontender nondistended. Her distention has resolved. Percutaneous nephrostomy on the right side is in place draining clear but darkly colored urine. Extremities: No peripheral edema   Portacath/PICC-without erythema  Lab Results:  Recent Labs  08/23/17 0434 08/24/17 0453  WBC 0.8* 2.8*  HGB 7.3* 7.7*  HCT 21.0* 21.9*  PLT 42* 48*    BMET  Recent Labs  08/23/17 0434 08/24/17 0453  NA 134* 139  K 3.1* 2.7*  CL 105 107  CO2 20* 22  GLUCOSE 102* 93  BUN 18 11  CREATININE 0.46* 0.38*  CALCIUM 6.0* 6.3*    Lab Results  Component Value Date   CEA1 0.7 06/28/2017    Studies/Results: No results found.  Medications: I have reviewed the patient's current medications.  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  Currently, right kidneys drained by percutaneous nephrostomy. Patient is significantly more stable, but still requiring pressor support. WBC up to 2.8 today, ANC not obtained. Hgb 7.7, Plt 48 -- stable or improving.  Recommendations: --Management of antibiotic therapy, and fluid replacement by primary service: While the patient remains neutropenic, do recommend continuing antibiotic coverage with an antibiotic that offers a more Pseudomonas protection such as piperacillin/tazobactam, meropenem, or cefepime. --Continue Neupogen 480 g daily until [place] is 2000 for 2 days in a row or => 5000 on one occasion --Anemia and thrombocytopenia are stable and do not require transfusion support today --Recommend transfusion support as follows: Use irradiated products only, Hgb <=7.0 -- transfuse 1U pRBC and re-check Hgb, Plt <= 20 -- transfuse 1 pack Plt --Disposition: Transfer out of the intensive care unit the discretion of the primary treating service. Discharge from the hospital at their discretion as well, as long as the patient is no longer neutropenic. Anemia and thrombocytopenia does not require patient staying in the hospital at the current level of cell counts. Patient has an appointment with me next Thursday and is instructed to keep it.  Oncology provider on call will be available over the weekend as needed for additional assistance.   LOS: 3 days   Ardath Sax, MD   08/24/2017, 10:30 AM

## 2017-08-24 NOTE — Progress Notes (Signed)
[place] Progress Note Patient Name: Cory Ibarra DOB: 10-09-1961 MRN: [id number]   Date of Service  08/24/2017  HPI/Events of Note  K 3.4. Creatinine 0.35 w/ a port.  eICU Interventions  KCl 61mEq IV x4 runs      Intervention Category Intermediate Interventions: Electrolyte abnormality - evaluation and management  Tera Partridge 08/24/2017, 7:12 PM

## 2017-08-24 NOTE — Progress Notes (Signed)
Referring Physician(s):  Dr. Alexis Frock  Supervising Physician: Corrie Mckusick  Patient Status:  [facility] - In-pt  Chief Complaint:  Right ureteral stone/urosepsis s/p PCN placement 9/18  Subjective: Patient feeling much better.  No pain or discomfort related to PCN.   Allergies: Bee venom  Medications: Prior to Admission medications   Medication Sig Start Date End Date Taking? Authorizing Provider  allopurinol (ZYLOPRIM) 300 MG tablet Take 1 tablet (300 mg total) by mouth daily. 07/16/17 08/21/17 Yes Perlov, Marinell Blight, MD  dexamethasone (DECADRON) 4 MG tablet Take 4 mg by mouth every other day.    Yes [provider]  lidocaine-prilocaine (EMLA) cream Apply to affected area once 07/24/17  Yes Perlov, Marinell Blight, MD  LORazepam (ATIVAN) 0.5 MG tablet Take 1 tablet (0.5 mg total) by mouth every 6 (six) hours as needed (Nausea or vomiting). 07/24/17  Yes Perlov, Marinell Blight, MD  oxyCODONE (OXY IR/ROXICODONE) 5 MG immediate release tablet Take 1 tablet (5 mg total) by mouth every 4 (four) hours as needed for moderate pain or severe pain. 08/13/17 09/03/17 Yes Hayden Pedro, PA-C  pantoprazole (PROTONIX) 40 MG tablet Take 1 tablet (40 mg total) by mouth daily at 12 noon. 08/13/17  Yes Hayden Pedro, PA-C  polyethylene glycol [facility] / Floria Raveling) packet Take 17 g by mouth daily as needed for mild constipation. 07/03/17  Yes Domenic Polite, MD  ondansetron (ZOFRAN) 8 MG tablet Take 1 tablet (8 mg total) by mouth 2 (two) times daily as needed for refractory nausea / vomiting. Start on day 3 after cyclophosphamide chemotherapy. Patient not taking: Reported on 08/21/2017 07/24/17   Ardath Sax, MD     Vital Signs: BP 115/68   Pulse (!) 52   Temp 98.1 F (36.7 C) (Oral)   Resp (!) 9   Ht 6\' 2"  (1.88 m)   Wt 168 lb 14 oz (76.6 kg)   SpO2 98%   BMI 21.68 kg/m   Physical Exam  NAD, alert Abdomen: soft, right PCN in place, non-tender. Drain intact, dressing in  place is clean and dry.  Dark urine in collection bag.   Imaging: Dg Chest 2 View  Result Date: 08/21/2017 CLINICAL DATA:  Dizzy in short of breath EXAM: CHEST  2 VIEW COMPARISON:  06/28/2017 FINDINGS: Right jugular Port-A-Cath placed with its tip at the cavoatrial junction. No pneumothorax. Lungs are hyperaerated and clear. Normal heart size. No pleural effusion. IMPRESSION: No active cardiopulmonary disease. Electronically Signed   By: Marybelle Killings M.D.   On: 08/21/2017 13:50   Dg Thoracic Spine 2 View  Result Date: 08/21/2017 CLINICAL DATA:  Pain. History of lower B-cell non-Hodgkin's lymphoma EXAM: THORACIC SPINE 3 VIEWS COMPARISON:  MR thoracic spine June 28, 2017 and chest radiograph August 21, 2017 FINDINGS: Frontal, lateral, and swimmer's views obtained. There is no evident acute fracture or spondylolisthesis. The pedicle at T12 on the right is no longer apparent and is presumably destroyed by neoplasm. This finding was evident on prior MR. there is subtle loss of detail along the lateral left T8 pedicle, noted by prior MR to be involved with tumor. There is no obvious neoplastic involvement vertebral bodies by radiography. MR shows tumor involving several vertebral bodies. IMPRESSION: No fracture or spondylolisthesis. There are multiple metastatic foci seen in the thoracic spine performed three months prior. By radiography, there is loss of the pedicle of T12 on the right and subtle loss of the lateral aspect of the pedicle of T8 on  the left. 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 No other neoplastic foci identified by radiography the bony structures. Note that there are multiple foci of tumor and thoracic vertebral bodies seen on recent MR not seen by radiography currently. Electronically Signed   By: Lowella Grip III M.D.   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 Electronically Signed   By: Lowella Grip III M.D.   On: 08/21/2017 16:02   Result Date: 08/21/2017 CLINICAL DATA:  Pain. History of large B-cell non-Hodgkin's lymphoma EXAM: LUMBAR SPINE - COMPLETE 4+ VIEW COMPARISON:  Lumbar MRI June 28, 2017 FINDINGS: Frontal, lateral, spot lumbosacral lateral, and bilateral oblique views were obtained. There are 5 non-rib-bearing lumbar type vertebral bodies. There is a stable fracture of the L3 vertebral body, more severe along the leftward aspect. There is lucency in this area suggesting metastatic focus as likely etiology for the fracture. There is apparent destruction in the region of the pedicle on the left at L3. No other fracture evident. No spondylolisthesis. Disc spaces appear normal. No other lytic or destructive lesions. There is a probable bone island medial upper right iliac crest. There is facet osteoarthritic change at L4-5 and L5-S1 bilaterally. IMPRESSION: There is again noted fracture L3 vertebral body. Tumor extends into and involves the left L3 pedicle. This finding was present on prior lumbar MR. No new bone lesions are evident. No new fracture. No spondylolisthesis. There is facet osteoarthritic change at L4-5 L5-S1 bilaterally. Electronically Signed: By: Lowella Grip III M.D. On: 08/21/2017 15:52   Dg C-arm 1-60 Min-no Report  Result Date: 08/21/2017 Fluoroscopy was utilized by the requesting physician.  No radiographic interpretation.   Ir Nephrostomy Placement Right  Result Date: 08/22/2017 INDICATION: History of lymphoma, admitted with neutropenic fever in urosepsis secondary to right-sided ureteral stone. Post failed attempted right-sided ureteral stent placement. As such, request made for placement of a right-sided percutaneous nephrostomy catheter for infection source control purposes. EXAM: 1. ULTRASOUND GUIDANCE FOR PUNCTURE OF THE  RIGHT RENAL COLLECTING Ibarra 2. RIGHT PERCUTANEOUS NEPHROSTOMY TUBE PLACEMENT. COMPARISON:  CT abdomen pelvis - 07/30/2017; right-sided retrograde pyelogram and attempted stent placement - earlier same day MEDICATIONS: The patient is currently admitted to the hospital and receiving intravenous antibiotics.;The antibiotic was administered in an appropriate time frame prior to skin puncture. ANESTHESIA/SEDATION: Moderate (conscious) sedation was employed during this procedure. A total of Versed 1 mg and Fentanyl 75 mcg was administered intravenously. Moderate Sedation Time: 5 minutes. The patient's level of consciousness and vital signs were monitored continuously by radiology nursing throughout the procedure under my direct supervision. CONTRAST:  20 mL Isovue 300 administered into the collecting Ibarra FLUOROSCOPY TIME:  1 minute 6 seconds (93.7 mGy) COMPLICATIONS: None immediate. PROCEDURE: The procedure, risks, benefits, and alternatives were explained to the patient. Questions regarding the procedure were encouraged and answered. The patient understands and consents to the procedure. A timeout was performed prior to the initiation of the procedure. The right flank region was prepped with Betadine in a sterile fashion, and a sterile drape was applied covering the operative field. A sterile gown and sterile gloves were used for the procedure. Local anesthesia was provided with 1% Lidocaine with epinephrine. Ultrasound was used to localize the right kidney. Under direct ultrasound guidance, a 21  gauge needle was advanced into the renal collecting Ibarra. An ultrasound image documentation was performed. Access within the collecting Ibarra was confirmed with the efflux of urine followed by contrast injection. Over a Nitrex wire, the inner three [place] catheter of an Accustick set was advanced into the renal collecting Ibarra. Contrast injection was injected into the collecting Ibarra as several spot radiographs were  obtained in various obliquities confirming puncture within a posterior inferior calix. As such, the tract was dilated with an Accustick stent. Over a guide wire, a 10-French percutaneous nephrostomy catheter was advanced into the collecting Ibarra where the coil was formed and locked. Contrast was injected and several sport radiographs were obtained in various obliquities confirming access. The catheter was secured at the skin with a Prolene retention suture and a gravity bag was placed. A dressing was placed. The patient tolerated procedure well without immediate postprocedural complication. FINDINGS: Ultrasound scanning demonstrates a moderately dilated right renal collecting Ibarra. Under direct ultrasound guidance, a posterior inferior calix was targeted allowing advancement of an 10-French percutaneous nephrostomy catheter under intermittent fluoroscopic guidance. Contrast injection confirmed appropriate positioning. Note is again made of an occlusive stone within the distal aspect of the right ureter. IMPRESSION: Successful ultrasound and fluoroscopic guided placement of a right sided 10 French PCN. This stone may be use for subsequent percutaneous nephrolithotomy at a later date as clinically indicated. Electronically Signed   By: Sandi Mariscal M.D.   On: 08/22/2017 07:59    Labs:  CBC:  Recent Labs  08/21/17 2249 08/22/17 0332 08/23/17 0434 08/24/17 0453  WBC <0.1* 0.1* 0.8* 2.8*  HGB 8.1* 8.5* 7.3* 7.7*  HCT 22.6* 24.2* 21.0* 21.9*  PLT 58* 70* 42* 48*    COAGS:  Recent Labs  06/29/17 0847 07/26/17 1049 08/21/17 1316  INR 1.06 1.13 1.23  APTT  --  29  --     BMP:  Recent Labs  08/21/17 1316 08/22/17 0332 08/23/17 0434 08/24/17 0453  NA 135 134* 134* 139  K 3.5 3.2* 3.1* 2.7*  CL 103 107 105 107  CO2 22 14* 20* 22  GLUCOSE 144* 183* 102* 93  BUN 12 16 18 11   CALCIUM 6.9* 5.9* 6.0* 6.3*  CREATININE 0.63 0.75 0.46* 0.38*  GFRNONAA >60 >60 >60 >60  GFRAA >60 >60 >60  >60    LIVER FUNCTION TESTS:  Recent Labs  07/16/17 1433 08/08/17 1329 08/16/17 0924 08/21/17 1316 08/23/17 0448  BILITOT 1.27* 0.85 0.89 2.2*  --   AST 44* 22 27 30   --   ALT 148* 22 59* 41  --   ALKPHOS 173* 106 110 112  --   PROT 7.1 6.9 6.4 5.9*  --   ALBUMIN 3.7 3.4* 3.4* 3.1* 2.3*    Assessment and Plan: Urosepsis s/p right percutaneous nephrostomy tube placement 08/21/17 Patient states he is feeling better.  He has no concerns related to his nephrostomy tube which is draining well.  Continue current management.  IR to follow.  Urology is managing his PCN; to remain in place after discharge.   Electronically Signed: Docia Barrier, PA 08/24/2017, 11:58 AM   I spent a total of 15 Minutes at the the patient's bedside AND on the patient's hospital floor or unit, greater than 50% of which was counseling/coordinating care for hydronephrosis

## 2017-08-25 LAB — CBC WITH DIFFERENTIAL/PLATELET
BASOS ABS: 0 10*3/uL (ref 0.0–0.1)
BASOS PCT: 0 %
Band Neutrophils: 49 %
Blasts: 0 %
EOS PCT: 0 %
Eosinophils Absolute: 0 10*3/uL (ref 0.0–0.7)
HCT: 23.6 % — ABNORMAL LOW (ref 39.0–52.0)
HEMOGLOBIN: 8.2 g/dL — AB (ref 13.0–17.0)
LYMPHS ABS: 0.1 10*3/uL — AB (ref 0.7–4.0)
Lymphocytes Relative: 1 %
MCH: 25.8 pg — AB (ref 26.0–34.0)
MCHC: 34.7 g/dL (ref 30.0–36.0)
MCV: 74.2 fL — ABNORMAL LOW (ref 78.0–100.0)
METAMYELOCYTES PCT: 4 %
MONOS PCT: 7 %
Monocytes Absolute: 0.6 10*3/uL (ref 0.1–1.0)
Myelocytes: 0 %
NEUTROS ABS: 8.4 10*3/uL — AB (ref 1.7–7.7)
NRBC: 3 /100{WBCs} — AB
Neutrophils Relative %: 39 %
Other: 0 %
PLATELETS: 54 10*3/uL — AB (ref 150–400)
Promyelocytes Absolute: 0 %
RBC: 3.18 MIL/uL — AB (ref 4.22–5.81)
RDW: 18.5 % — ABNORMAL HIGH (ref 11.5–15.5)
WBC MORPHOLOGY: INCREASED
WBC: 9.1 10*3/uL (ref 4.0–10.5)

## 2017-08-25 LAB — BASIC METABOLIC PANEL
Anion gap: 10 (ref 5–15)
BUN: 9 mg/dL (ref 6–20)
CHLORIDE: 110 mmol/L (ref 101–111)
CO2: 23 mmol/L (ref 22–32)
CREATININE: 0.41 mg/dL — AB (ref 0.61–1.24)
Calcium: 6.7 mg/dL — ABNORMAL LOW (ref 8.9–10.3)
GFR calc non Af Amer: >60 mL/min (ref >60)
GLUCOSE: 97 mg/dL (ref 65–99)
Potassium: 3.6 mmol/L (ref 3.5–5.1)
Sodium: 143 mmol/L (ref 135–145)

## 2017-08-25 LAB — MAGNESIUM: Magnesium: 1.8 mg/dL (ref 1.7–2.4)

## 2017-08-25 LAB — PHOSPHORUS: Phosphorus: <1 mg/dL — CL (ref 2.5–4.6)

## 2017-08-25 MED ORDER — SODIUM CHLORIDE 0.9 % IV SOLN
1.0000 g | Freq: Once | INTRAVENOUS | Status: AC
  Administered 2017-08-25: 1 g via INTRAVENOUS
  Filled 2017-08-25: qty 10

## 2017-08-25 MED ORDER — K PHOS MONO-SOD PHOS DI & MONO 155-852-130 MG PO TABS
500.0000 mg | ORAL_TABLET | Freq: Once | ORAL | Status: AC
  Administered 2017-08-25: 500 mg via ORAL
  Filled 2017-08-25 (×2): qty 2

## 2017-08-25 MED ORDER — HYDROCORTISONE NA SUCCINATE PF 100 MG IJ SOLR
50.0000 mg | Freq: Two times a day (BID) | INTRAMUSCULAR | Status: DC
  Administered 2017-08-25 – 2017-08-26 (×3): 50 mg via INTRAVENOUS
  Filled 2017-08-25: qty 2
  Filled 2017-08-25 (×2): qty 1

## 2017-08-25 MED ORDER — SODIUM PHOSPHATES 45 MMOLE/15ML IV SOLN
40.0000 meq | Freq: Once | INTRAVENOUS | Status: AC
  Administered 2017-08-25: 40 meq via INTRAVENOUS
  Filled 2017-08-25: qty 10

## 2017-08-25 NOTE — Progress Notes (Signed)
PROGRESS NOTE    Cory Ibarra  UUV:[id number] DOB: 02/14/1961 DOA: 08/21/2017 PCP: [name], [name]    Brief Narrative: 56 yo male presented with fever, n/v, hematuria, diarrhea.  Found to have multiple renal calculi with Rt ureteral calculus.  Required pressors in setting of septic shock and PCCM asked to assess.  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   Assessment & Plan:   Active Problems:   Neutropenia (HCC)   Gross hematuria   Lactic acidosis   Neutropenic sepsis ([place])   Septic shock due to Escherichia coli ([place])   Sepsis secondary to e. Coli bacteremia  Obstructed R ureteral Stone:  Now s/p perc nephrostomy.  Pressors now d/c'd. - s/p perc nephrostomy tube (urology unable to place retrograde stend due to severe impaction)   - ceftriaxone 2 grams daily (9/19 - ) (vanc 9/18-9/19; zosyn 9/18-9/19) - ucx multiple species, blood cx x2 e. 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 Include things that cannot be auto populated i.e.  Echo, Carotid and venous dopplers, Foley, Bipap, HD, tubes/drains, wound vac, central lines etc)  Perc nephrostomy tube placement  R cystoscopy with retrograde pyelogram, unsuccessful stent attempt  Antimicrobials: (specify start and planned stop date. Auto populated tables are space occupying and do not give end dates) Anti-infectives    Start     Dose/Rate Route Frequency Ordered Stop   08/22/17 1000  cefTRIAXone (ROCEPHIN) 2 g in dextrose 5 % 50 mL IVPB     2 g 100 mL/hr over 30 Minutes Intravenous Every 24 hours 08/22/17 0836     08/22/17 0000  vancomycin (VANCOCIN) IVPB 750 mg/150 ml premix  Status:  Discontinued     750 mg 150 mL/hr over 60 Minutes Intravenous Every 8 hours 08/21/17 1858 08/22/17 0836   08/21/17 2200  piperacillin-tazobactam (ZOSYN) IVPB 3.375 g  Status:  Discontinued     3.375 g 12.5 mL/hr over 240 Minutes Intravenous Every 8 hours 08/21/17 1858 08/22/17 0836   08/21/17 1530  vancomycin (VANCOCIN) 1,500 mg in sodium chloride 0.9 % 500 mL IVPB     1,500 mg 250 mL/hr over 120 Minutes Intravenous  Once 08/21/17 1525 08/21/17 1745   08/21/17 1500  piperacillin-tazobactam (ZOSYN) IVPB 3.375 g     3.375 g 100 mL/hr over 30 Minutes Intravenous  Once 08/21/17 1451 08/21/17 1550   08/21/17 1500  vancomycin (VANCOCIN) IVPB 1000 mg/200 mL premix  Status:  Discontinued     1,000 mg 200 mL/hr over 60 Minutes Intravenous  Once 08/21/17 1451 08/21/17 1525  Subjective: Feeling better.  When he came in had significant pain and diarrhea.  Objective: Vitals:   08/25/17 0500 08/25/17 0600 08/25/17 0619 08/25/17 0712  BP:      Pulse: 100 95 89   Resp: (!) 25 (!) 28 14   Temp:    98.8 F (37.1 C)  TempSrc:    Oral  SpO2: 95% 94% 91%   Weight:      Height:        Intake/Output Summary (Last 24 hours) at 08/25/17 0733 Last data filed at 08/25/17 0520  Gross per 24 hour  Intake          2089.58 ml  Output             1775 ml  Net           314.58 ml    Filed Weights   08/21/17 1216 08/21/17 1441 08/21/17 1643  Weight: 81.6 kg (180 lb) 81.6 kg (180 lb) 76.6 kg (168 lb 14 oz)    Examination:  General: No acute distress.  Weak.   Cardiovascular: Heart sounds show Ashleyanne Hemmingway regular rate, and rhythm. No gallops or rubs. No murmurs. No JVD.  Port. Lungs: Clear to auscultation bilaterally with good air movement. No rales, rhonchi or wheezes. Abdomen: Soft, nontender, nondistended with normal active bowel sounds. No masses. No hepatosplenomegaly.  Doesn't sit forward for me, so unable to visualized perc tube.  Neurological: Alert and oriented 3. Moves all extremities 4 with equal strength. Cranial nerves II through XII grossly intact. Skin: Warm and dry. No rashes or lesions. Extremities: No clubbing or cyanosis. No edema. Pedal pulses 2+. Psychiatric: Mood and affect are normal. Insight and judgment are appropriate.    Data Reviewed: I have personally reviewed following labs and imaging studies  CBC:  Recent Labs Lab 08/21/17 1316 08/21/17 2249 08/22/17 0332 08/23/17 0434 08/24/17 0453 08/24/17 1207  WBC 0.1* <0.1* 0.1* 0.8* 2.8* 5.3  NEUTROABS 0.0*  --   --  0.6*  --  4.5  HGB 9.8* 8.1* 8.5* 7.3* 7.7* 8.7*  HCT 27.6* 22.6* 24.2* 21.0* 21.9* 24.9*  MCV 71.7* 72.4* 73.1* 73.2* 72.0* 73.7*  PLT 76* 58* 70* 42* 48* 57*   Basic Metabolic Panel:  Recent Labs Lab 08/21/17 1316 08/21/17 2249 08/22/17 0332 08/23/17 0434 08/24/17 0453 08/24/17 1805 08/25/17 0315  NA 135  --  134* 134* 139 140 143  K 3.5  --  3.2* 3.1* 2.7* 3.4* 3.6  CL 103  --  107 105 107 108 110  CO2 22  --  14* 20* 22 23 23   GLUCOSE 144*  --  183* 102* 93 97 97  BUN 12  --  16 18 11 9 9   CREATININE 0.63  --  0.75 0.46* 0.38* 0.35* 0.41*  CALCIUM 6.9*  --  5.9* 6.0* 6.3* 6.6* 6.7*  MG  --  1.4* 1.8 1.7  --   --  1.8  PHOS 1.9*  --  3.4  --   --   --  <1.0*   GFR: Estimated Creatinine Clearance: 109 mL/min (Jannett Schmall) (by C-G formula based on SCr of 0.41 mg/dL  (L)). Liver Function Tests:  Recent Labs Lab 08/21/17 1316 08/23/17 0448  AST 30  --   ALT 41  --   ALKPHOS 112  --   BILITOT 2.2*  --   PROT 5.9*  --   ALBUMIN 3.1* 2.3*   No results for input(s): LIPASE, AMYLASE in the last 168 hours.  No results for input(s): AMMONIA in the last 168 hours. Coagulation Profile:  Recent Labs Lab 08/21/17 1316  INR 1.23   Cardiac Enzymes:  Recent Labs Lab 08/21/17 1316 08/21/17 2249 08/22/17 0332  CKTOTAL 65  --   --   TROPONINI  --  0.98* 0.88*   BNP (last 3 results) No results for input(s): PROBNP in the last 8760 hours. HbA1C: No results for input(s): HGBA1C in the last 72 hours. CBG:  Recent Labs Lab 08/21/17 2338 08/22/17 0312 08/22/17 0750 08/22/17 1203 08/22/17 1641  GLUCAP 105* 179* 120* 122* 110*   Lipid Profile: No results for input(s): CHOL, HDL, LDLCALC, TRIG, CHOLHDL, LDLDIRECT in the last 72 hours. Thyroid Function Tests: No results for input(s): TSH, T4TOTAL, FREET4, T3FREE, THYROIDAB in the last 72 hours. Anemia Panel: No results for input(s): VITAMINB12, FOLATE, FERRITIN, TIBC, IRON, RETICCTPCT in the last 72 hours. Sepsis Labs:  Recent Labs Lab 08/21/17 2249 08/22/17 0332 08/22/17 0414 08/22/17 0836 08/23/17 0434  PROCALCITON 67.99 69.28  --   --  33.06  LATICACIDVEN 3.6*  --  3.3* 3.2* 1.9    Recent Results (from the past 240 hour(s))  Culture, blood (Routine x 2)     Status: Abnormal   Collection Time: 08/21/17  1:23 PM  Result Value Ref Range Status   Specimen Description BLOOD RIGHT PORTA CATH  Final   Special Requests   Final    BOTTLES DRAWN AEROBIC AND ANAEROBIC Blood Culture adequate volume   Culture  Setup Time   Final    GRAM NEGATIVE RODS IN BOTH AEROBIC AND ANAEROBIC BOTTLES CRITICAL RESULT CALLED TO, READ BACK BY AND VERIFIED WITH: Damian Leavell [id number] MLM Performed at [facility], [place] [address] [place], [place] [address]    Culture ESCHERICHIA COLI (Sydne Krahl)   Final   Report Status 08/24/2017 FINAL  Final   Organism ID, Bacteria ESCHERICHIA COLI  Final      Susceptibility   Escherichia coli - MIC*    AMPICILLIN <=2 SENSITIVE Sensitive     CEFAZOLIN <=4 SENSITIVE Sensitive     CEFEPIME <=1 SENSITIVE Sensitive     CEFTAZIDIME <=1 SENSITIVE Sensitive     CEFTRIAXONE <=1 SENSITIVE Sensitive     CIPROFLOXACIN <=0.25 SENSITIVE Sensitive     GENTAMICIN <=1 SENSITIVE Sensitive     IMIPENEM <=0.25 SENSITIVE Sensitive     TRIMETH/SULFA <=20 SENSITIVE Sensitive     AMPICILLIN/SULBACTAM <=2 SENSITIVE Sensitive     PIP/TAZO <=4 SENSITIVE Sensitive     Extended ESBL NEGATIVE Sensitive     * ESCHERICHIA COLI  Blood Culture ID Panel (Reflexed)     Status: Abnormal   Collection Time: 08/21/17  1:23 PM  Result Value Ref Range Status   Enterococcus species NOT DETECTED NOT DETECTED Final   Vancomycin resistance NOT DETECTED NOT DETECTED Final   Listeria monocytogenes NOT DETECTED NOT DETECTED Final   Staphylococcus species NOT DETECTED NOT DETECTED Final   Staphylococcus aureus NOT DETECTED NOT DETECTED Final   Methicillin resistance NOT DETECTED NOT DETECTED Final   Streptococcus species NOT DETECTED NOT DETECTED Final   Streptococcus agalactiae NOT DETECTED NOT DETECTED Final   Streptococcus pneumoniae NOT DETECTED NOT DETECTED Final   Streptococcus pyogenes NOT DETECTED NOT DETECTED Final   Acinetobacter baumannii NOT DETECTED NOT DETECTED Final   Enterobacteriaceae species DETECTED (Kashvi Prevette) NOT DETECTED Final    Comment: CRITICAL RESULT CALLED TO, READ BACK BY AND VERIFIED WITH: PHARMD Melodye Ped [id number] MLM  Enterobacter cloacae complex NOT DETECTED NOT DETECTED Final   Escherichia coli DETECTED (Sanskriti Greenlaw) NOT DETECTED Final    Comment: CRITICAL RESULT CALLED TO, READ BACK BY AND VERIFIED WITH: PHARMD J GADHIA [id number] MLM    Klebsiella oxytoca NOT DETECTED NOT DETECTED Final   Klebsiella pneumoniae NOT DETECTED NOT DETECTED Final   Proteus species  NOT DETECTED NOT DETECTED Final   Serratia marcescens NOT DETECTED NOT DETECTED Final   Carbapenem resistance NOT DETECTED NOT DETECTED Final   Haemophilus influenzae NOT DETECTED NOT DETECTED Final   Neisseria meningitidis NOT DETECTED NOT DETECTED Final   Pseudomonas aeruginosa NOT DETECTED NOT DETECTED Final   Candida albicans NOT DETECTED NOT DETECTED Final   Candida glabrata NOT DETECTED NOT DETECTED Final   Candida krusei NOT DETECTED NOT DETECTED Final   Candida parapsilosis NOT DETECTED NOT DETECTED Final   Candida tropicalis NOT DETECTED NOT DETECTED Final    Comment: Performed at [facility], [place] [address], [place], [place] [address]  Culture, blood (Routine x 2)     Status: Abnormal   Collection Time: 08/21/17  2:07 PM  Result Value Ref Range Status   Specimen Description BLOOD LEFT FOREARM  Final   Special Requests   Final    BOTTLES DRAWN AEROBIC AND ANAEROBIC Blood Culture adequate volume   Culture  Setup Time   Final    GRAM NEGATIVE RODS AEROBIC BOTTLE ONLY CRITICAL VALUE NOTED.  VALUE IS CONSISTENT WITH PREVIOUSLY REPORTED AND CALLED VALUE.    Culture (Hafsa Lohn)  Final    ESCHERICHIA COLI SUSCEPTIBILITIES PERFORMED ON PREVIOUS CULTURE WITHIN THE LAST 5 DAYS. Performed at [facility], [place] [address], [place], [place] [address]    Report Status 08/24/2017 FINAL  Final  Urine culture     Status: Abnormal   Collection Time: 08/21/17  2:50 PM  Result Value Ref Range Status   Specimen Description URINE, RANDOM  Final   Special Requests NONE  Final   Culture MULTIPLE SPECIES PRESENT, SUGGEST RECOLLECTION (Katiejo Gilroy)  Final   Report Status 08/22/2017 FINAL  Final  MRSA PCR Screening     Status: Abnormal   Collection Time: 08/21/17  4:25 PM  Result Value Ref Range Status   MRSA by PCR POSITIVE (Lorisa Scheid) NEGATIVE Final    Comment:        The GeneXpert MRSA Assay (FDA approved for NASAL specimens only), is one component of Lawrencia Mauney comprehensive MRSA colonization surveillance  program. It is not intended to diagnose MRSA infection nor to guide or monitor treatment for MRSA infections. RESULT CALLED TO, READ BACK BY AND VERIFIED WITH: S.KOTIAN RN AT 1936 ON 08/21/17 BY S.VANHOORNE MLS   Gastrointestinal Panel by PCR , Stool     Status: None   Collection Time: 08/21/17  6:25 PM  Result Value Ref Range Status   Campylobacter species NOT DETECTED NOT DETECTED Final   Plesimonas shigelloides NOT DETECTED NOT DETECTED Final   Salmonella species NOT DETECTED NOT DETECTED Final   Yersinia enterocolitica NOT DETECTED NOT DETECTED Final   Vibrio species NOT DETECTED NOT DETECTED Final   Vibrio cholerae NOT DETECTED NOT DETECTED Final   Enteroaggregative E coli (EAEC) NOT DETECTED NOT DETECTED Final   Enteropathogenic E coli (EPEC) NOT DETECTED NOT DETECTED Final   Enterotoxigenic E coli (ETEC) NOT DETECTED NOT DETECTED Final   Shiga like toxin producing E coli (STEC) NOT DETECTED NOT DETECTED Final   Shigella/Enteroinvasive E coli (EIEC) NOT DETECTED NOT DETECTED Final  Cryptosporidium NOT DETECTED NOT DETECTED Final   Cyclospora cayetanensis NOT DETECTED NOT DETECTED Final   Entamoeba histolytica NOT DETECTED NOT DETECTED Final   Giardia lamblia NOT DETECTED NOT DETECTED Final   Adenovirus F40/41 NOT DETECTED NOT DETECTED Final   Astrovirus NOT DETECTED NOT DETECTED Final   Norovirus GI/GII NOT DETECTED NOT DETECTED Final   Rotavirus Hailey Miles NOT DETECTED NOT DETECTED Final   Sapovirus (I, II, IV, and V) NOT DETECTED NOT DETECTED Final  Urine culture     Status: None   Collection Time: 08/21/17 11:15 PM  Result Value Ref Range Status   Specimen Description URINE, SUPRAPUBIC RIGHT KIDNEY  Final   Special Requests Immunocompromised  Final   Culture   Final    NO GROWTH Performed at [facility], [place] [address], [place], [place] [address]    Report Status 08/23/2017 FINAL  Final         Radiology Studies: No results found.      Scheduled Meds: .  chlorhexidine  15 mL Mouth Rinse BID  . Chlorhexidine Gluconate Cloth  6 each Topical Daily  . hydrocortisone sod succinate (SOLU-CORTEF) inj  50 mg Intravenous Q12H  . mouth rinse  15 mL Mouth Rinse q12n4p  . mupirocin ointment  1 application Nasal BID  . pantoprazole  40 mg Oral Daily  . sodium chloride flush  10-40 mL Intracatheter Q12H  . sodium chloride flush  5 mL Intravenous Q8H  . Tbo-Filgrastim  480 mcg Subcutaneous Daily   Continuous Infusions: . cefTRIAXone (ROCEPHIN)  IV Stopped (08/24/17 1010)  . lactated ringers 75 mL/hr at 08/25/17 0300     LOS: 4 days    Time spent: over 30 min    Fayrene Helper, MD [facility] [phone]   If 7PM-7AM, please contact night-coverage [email or web address] Password [facility] 08/25/2017, 7:33 AM

## 2017-08-25 NOTE — Progress Notes (Signed)
Received pt via wheelchair from ICU. Pts assessment is unchanged from earlier documentation. Will continue to monitor

## 2017-08-25 NOTE — Progress Notes (Signed)
CRITICAL VALUE ALERT  Critical Value: phosphorous <1  Date & Time Notied: 08/25/2017 1100  Provider Notified: Dr. Florene Glen  Orders Received/Actions taken: orders received, phos drip to be added

## 2017-08-25 NOTE — Progress Notes (Signed)
attepmted to get patient OOB to chair, and the patient refused to get OOB.  Patient also refused to put his SCD's back on bilaterally and refuses to be pulled up in bed and turned.  Education given to patient about importance of turning and increasing activity.  Patient refuses any activity that is encouraged.  Continue to try and get patient OOB and increase his activity.  Jestina Stephani Roselie Awkward RN

## 2017-08-25 NOTE — Progress Notes (Signed)
CRITICAL VALUE ALERT  Critical Value:  Phosphorus <1, Calcium 6.7  Date & Time Notified: 08/25/17 05:00  Provider Notified: [place]  Orders Received/Actions taken: 1g calcium gluconate, 500mg  [facility]

## 2017-08-26 LAB — COMPREHENSIVE METABOLIC PANEL
ALT: 107 U/L — ABNORMAL HIGH (ref 17–63)
ANION GAP: 8 (ref 5–15)
AST: 28 U/L (ref 15–41)
Albumin: 2.3 g/dL — ABNORMAL LOW (ref 3.5–5.0)
Alkaline Phosphatase: 138 U/L — ABNORMAL HIGH (ref 38–126)
BILIRUBIN TOTAL: 1.3 mg/dL — AB (ref 0.3–1.2)
BUN: 9 mg/dL (ref 6–20)
CO2: 25 mmol/L (ref 22–32)
Calcium: 6.5 mg/dL — ABNORMAL LOW (ref 8.9–10.3)
Chloride: 111 mmol/L (ref 101–111)
Creatinine, Ser: 0.44 mg/dL — ABNORMAL LOW (ref 0.61–1.24)
GFR calc Af Amer: >60 mL/min (ref >60)
Glucose, Bld: 98 mg/dL (ref 65–99)
POTASSIUM: 2.9 mmol/L — AB (ref 3.5–5.1)
Sodium: 144 mmol/L (ref 135–145)
TOTAL PROTEIN: 4.7 g/dL — AB (ref 6.5–8.1)

## 2017-08-26 LAB — CBC WITH DIFFERENTIAL/PLATELET
BASOS PCT: 0 %
Basophils Absolute: 0 10*3/uL (ref 0.0–0.1)
EOS ABS: 0 10*3/uL (ref 0.0–0.7)
Eosinophils Relative: 0 %
HCT: 25.6 % — ABNORMAL LOW (ref 39.0–52.0)
Hemoglobin: 8.7 g/dL — ABNORMAL LOW (ref 13.0–17.0)
Lymphocytes Relative: 2 %
Lymphs Abs: 0.4 10*3/uL — ABNORMAL LOW (ref 0.7–4.0)
MCH: 25.4 pg — AB (ref 26.0–34.0)
MCHC: 34 g/dL (ref 30.0–36.0)
MCV: 74.9 fL — ABNORMAL LOW (ref 78.0–100.0)
MONOS PCT: 4 %
Monocytes Absolute: 0.9 10*3/uL (ref 0.1–1.0)
NEUTROS PCT: 94 %
Neutro Abs: 18.8 10*3/uL — ABNORMAL HIGH (ref 1.7–7.7)
PLATELETS: 66 10*3/uL — AB (ref 150–400)
RBC: 3.42 MIL/uL — ABNORMAL LOW (ref 4.22–5.81)
RDW: 18.7 % — ABNORMAL HIGH (ref 11.5–15.5)
WBC: 20 10*3/uL — AB (ref 4.0–10.5)

## 2017-08-26 LAB — MAGNESIUM: MAGNESIUM: 1.6 mg/dL — AB (ref 1.7–2.4)

## 2017-08-26 LAB — PHOSPHORUS: Phosphorus: 1.7 mg/dL — ABNORMAL LOW (ref 2.5–4.6)

## 2017-08-26 MED ORDER — SODIUM CHLORIDE 0.9 % IV SOLN
1.0000 g | Freq: Once | INTRAVENOUS | Status: AC
  Administered 2017-08-26: 1 g via INTRAVENOUS
  Filled 2017-08-26: qty 10

## 2017-08-26 MED ORDER — POTASSIUM CHLORIDE CRYS ER 20 MEQ PO TBCR
40.0000 meq | EXTENDED_RELEASE_TABLET | Freq: Two times a day (BID) | ORAL | Status: DC
  Administered 2017-08-26: 40 meq via ORAL
  Filled 2017-08-26 (×6): qty 2

## 2017-08-26 MED ORDER — ENSURE ENLIVE PO LIQD: 237.0000 mL | Freq: Two times a day (BID) | ORAL | Status: DC

## 2017-08-26 MED ORDER — MAGNESIUM SULFATE 4 GM/100ML IV SOLN
4.0000 g | Freq: Once | INTRAVENOUS | Status: AC
  Administered 2017-08-26: 4 g via INTRAVENOUS
  Filled 2017-08-26: qty 100

## 2017-08-26 MED ORDER — HYDROCORTISONE NA SUCCINATE PF 100 MG IJ SOLR
50.0000 mg | Freq: Once | INTRAMUSCULAR | Status: AC
  Administered 2017-08-26: 50 mg via INTRAVENOUS
  Filled 2017-08-26: qty 1

## 2017-08-26 MED ORDER — POTASSIUM PHOSPHATES 15 MMOLE/5ML IV SOLN
30.0000 meq | Freq: Once | INTRAVENOUS | Status: AC
  Administered 2017-08-26: 30 meq via INTRAVENOUS
  Filled 2017-08-26: qty 6.82

## 2017-08-26 MED ORDER — POTASSIUM CHLORIDE 10 MEQ/100ML IV SOLN
10.0000 meq | INTRAVENOUS | Status: AC
  Administered 2017-08-26 – 2017-08-27 (×4): 10 meq via INTRAVENOUS
  Filled 2017-08-26 (×4): qty 100

## 2017-08-26 NOTE — Progress Notes (Signed)
PROGRESS NOTE    Cory Ibarra  YPP:[id number] DOB: 12/11/1960 DOA: 08/21/2017 PCP: [name], [name]    Brief Narrative: 56 yo male presented with fever, n/v, hematuria, diarrhea.  Found to have multiple renal calculi with Rt ureteral calculus.  Required pressors in setting of septic shock and PCCM asked to assess.  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   Assessment & Plan:   Active Problems:   Neutropenia (HCC)   Gross hematuria   Lactic acidosis   Neutropenic sepsis ([place])   Septic shock due to Escherichia coli ([place])   Sepsis secondary to e. Coli bacteremia  Obstructed R ureteral Stone:  Now s/p perc nephrostomy.  Pressors now d/c'd. - WBC elevated today, likely 2/2 neuopogen recently d/c'd - s/p perc nephrostomy tube (urology unable to place retrograde stend due to severe impaction)   - ceftriaxone 2 grams daily (9/19 - ) (vanc 9/18-9/19; zosyn 9/18-9/19) - ucx multiple species, blood cx x2 e. 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   Elevated troponin: on presentation, 2/2 demand ischemia  Diarrhea: improved, GI path panel on admission negative  Nutrition c/s for poor PO intake:  Noted by family as well. Looks like he didn't participate with this evaluation very much.  Recommended ensure enlive BID  He's refused to get out of bed and participate with nursing at time.  I  discussed importance of working with staff and PT.  Inquired about depression with family out of the room, which he denied, but I suspect there's probably Cloyde Oregel degree of this.  Will continue to monitor for now.  Encourage participation and [place].   DVT prophylaxis: SCD Code Status: Partial, no CPR Family Communication: none in room Disposition Plan: pending improvement, PT/OT eval   Consultants:   PCCM, Urology, IR, Oncology  Procedures: (Don't include imaging studies which can be auto populated. Include things that cannot be auto populated i.e. Echo, Carotid and venous dopplers, Foley, Bipap, HD, tubes/drains, wound vac, central lines etc)  Perc nephrostomy tube placement  R cystoscopy with retrograde pyelogram, unsuccessful stent attempt  Antimicrobials: (specify start and planned stop date. Auto populated tables are space occupying and do not give end dates) Anti-infectives    Start     Dose/Rate Route Frequency Ordered Stop   08/22/17 1000  cefTRIAXone (ROCEPHIN) 2 g in dextrose 5 % 50 mL IVPB     2 g 100 mL/hr over 30 Minutes Intravenous Every 24 hours 08/22/17 0836     08/22/17 0000  vancomycin (VANCOCIN) IVPB 750 mg/150 ml premix  Status:  Discontinued     750 mg 150 mL/hr over 60 Minutes Intravenous Every 8 hours 08/21/17 1858 08/22/17 0836   08/21/17 2200  piperacillin-tazobactam (ZOSYN) IVPB 3.375 g  Status:  Discontinued     3.375 g 12.5 mL/hr over 240 Minutes Intravenous Every 8 hours 08/21/17 1858 08/22/17 0836   08/21/17 1530  vancomycin (VANCOCIN) 1,500 mg in sodium chloride 0.9 % 500 mL IVPB     1,500 mg 250 mL/hr over 120 Minutes Intravenous  Once 08/21/17 1525 08/21/17 1745   08/21/17 1500  piperacillin-tazobactam (ZOSYN) IVPB 3.375 g     3.375 g 100 mL/hr over 30 Minutes Intravenous  Once 08/21/17 1451 08/21/17 1550   08/21/17 1500  vancomycin (VANCOCIN) IVPB 1000 mg/200 mL premix  Status:  Discontinued     1,000 mg 200 mL/hr over 60 Minutes Intravenous  Once  08/21/17 1451 08/21/17 1525       Subjective: Ready to go home.    Objective: Vitals:   08/25/17 1845 08/25/17 2021 08/26/17 0615 08/26/17 1118  BP: 121/72 129/70 128/72 136/77  Pulse: 88 94 94 93  Resp: _0 Temp: 98.2 F (36.8 C) 98.6 F (37 C) 98.4 F (36.9 C) 98.3 F (36.8 C)  TempSrc: Oral Oral Oral Oral  SpO2: 93% 96% 94% 94%  Weight:      Height:        Intake/Output Summary (Last 24 hours) at 08/26/17 1344 Last data filed at 08/26/17 1126  Gross per 24 hour  Intake             1330 ml  Output             1625 ml  Net             -295 ml   Filed Weights   08/21/17 1216 08/21/17 1441 08/21/17 1643  Weight: 81.6 kg (180 lb) 81.6 kg (180 lb) 76.6 kg (168 lb 14 oz)    Examination:  General: No acute distress. weak Cardiovascular: Heart sounds show Fern Asmar regular rate, and rhythm. No gallops or rubs. No murmurs. No JVD. Lungs: Clear to auscultation bilaterally with good air movement. No rales, rhonchi or wheezes. Abdomen: Soft, nontender, nondistended with normal active bowel sounds. No masses. No hepatosplenomegaly. Neurological: Alert and oriented 3. Moves all extremities 4 with equal strength. Cranial nerves II through XII grossly intact. Skin: Warm and dry. No rashes or lesions. Extremities: No clubbing or cyanosis. No edema. Pedal pulses 2+. Psychiatric: affect is depressed, but states mood is good Perc neph tube and port visualized   Data Reviewed: I have personally reviewed following labs and imaging studies  CBC:  Recent Labs Lab 08/21/17 1316  08/23/17 0434 08/24/17 0453 08/24/17 1207 08/25/17 1042 08/26/17 0322  WBC 0.1*  < > 0.8* 2.8* 5.3 9.1 20.0*  NEUTROABS 0.0*  --  0.6*  --  4.5 8.4* 18.8*  HGB 9.8*  < > 7.3* 7.7* 8.7* 8.2* 8.7*  HCT 27.6*  < > 21.0* 21.9* 24.9* 23.6* 25.6*  MCV 71.7*  < > 73.2* 72.0* 73.7* 74.2* 74.9*  PLT 76*  < > 42* 48* 57* 54* 66*  < > = values in this interval not displayed. Basic Metabolic  Panel:  Recent Labs Lab 08/21/17 1316 08/21/17 2249 08/22/17 0332 08/23/17 0434 08/24/17 0453 08/24/17 1805 08/25/17 0315 08/25/17 1042 08/26/17 0322  NA 135  --  134* 134* 139 140 143  --  144  K 3.5  --  3.2* 3.1* 2.7* 3.4* 3.6  --  2.9*  CL 103  --  107 105 107 108 110  --  111  CO2 22  --  14* 20* _1 --  25  GLUCOSE 144*  --  183* 102* 93 97 97  --  98  BUN 12  --  _0 --  9  CREATININE 0.63  --  0.75 0.46* 0.38* 0.35* 0.41*  --  0.44*  CALCIUM 6.9*  --  5.9* 6.0* 6.3* 6.6* 6.7*  --  6.5*  MG  --  1.4* 1.8 1.7  --   --  1.8  --  1.6*  PHOS 1.9*  --  3.4  --   --   --  <1.0* <1.0* 1.7*   GFR: Estimated Creatinine Clearance: 109 mL/min (Donnel Venuto) (by C-G formula based on SCr of 0.44 mg/dL (L)). Liver Function Tests:  Recent Labs Lab 08/21/17 1316 08/23/17 0448 08/26/17 0322  AST 30  --  28  ALT 41  --  107*  ALKPHOS 112  --  138*  BILITOT 2.2*  --  1.3*  PROT 5.9*  --  4.7*  ALBUMIN 3.1* 2.3* 2.3*   No results for input(s): LIPASE, AMYLASE in the last 168 hours. No results for input(s): AMMONIA in the last 168 hours. Coagulation Profile:  Recent Labs Lab 08/21/17 1316  INR 1.23   Cardiac Enzymes:  Recent Labs Lab 08/21/17 1316 08/21/17 2249 08/22/17 0332  CKTOTAL 65  --   --   TROPONINI  --  0.98* 0.88*   BNP (last 3 results) No results for input(s): PROBNP in the last 8760 hours. HbA1C: No results for input(s): HGBA1C in the last 72 hours. CBG:  Recent Labs Lab 08/21/17 2338 08/22/17 0312 08/22/17 0750 08/22/17 1203 08/22/17 1641  GLUCAP 105* 179* 120* 122* 110*   Lipid Profile: No results for input(s): CHOL, HDL, LDLCALC, TRIG, CHOLHDL, LDLDIRECT in the last 72 hours. Thyroid Function Tests: No results for input(s): TSH, T4TOTAL, FREET4, T3FREE, THYROIDAB in the last 72 hours. Anemia Panel: No results for input(s): VITAMINB12, FOLATE, FERRITIN, TIBC, IRON, RETICCTPCT in the last 72 hours. Sepsis Labs:  Recent Labs Lab  08/21/17 2249 08/22/17 0332 08/22/17 0414 08/22/17 0836 08/23/17 0434  PROCALCITON 67.99 69.28  --   --  33.06  LATICACIDVEN 3.6*  --  3.3* 3.2* 1.9    Recent Results (from the past 240 hour(s))  Culture, blood (Routine x 2)     Status: Abnormal   Collection Time: 08/21/17  1:23 PM  Result Value Ref Range Status   Specimen Description BLOOD RIGHT PORTA CATH  Final   Special Requests   Final    BOTTLES DRAWN AEROBIC AND ANAEROBIC Blood Culture adequate volume   Culture  Setup Time   Final    GRAM NEGATIVE RODS IN BOTH AEROBIC AND ANAEROBIC BOTTLES CRITICAL RESULT CALLED TO, READ BACK BY AND VERIFIED WITH: Damian Leavell [id number] MLM Performed at [facility], [place] [address] [place], [place] [address]    Culture ESCHERICHIA COLI (Alyssamae Klinck)  Final   Report Status 08/24/2017 FINAL  Final   Organism ID, Bacteria ESCHERICHIA COLI  Final      Susceptibility   Escherichia coli - MIC*    AMPICILLIN <=2 SENSITIVE Sensitive     CEFAZOLIN <=4 SENSITIVE Sensitive     CEFEPIME <=1 SENSITIVE Sensitive     CEFTAZIDIME <=1 SENSITIVE Sensitive     CEFTRIAXONE <=1 SENSITIVE Sensitive     CIPROFLOXACIN <=0.25 SENSITIVE Sensitive     GENTAMICIN <=1 SENSITIVE Sensitive     IMIPENEM <=0.25 SENSITIVE Sensitive     TRIMETH/SULFA <=20 SENSITIVE Sensitive     AMPICILLIN/SULBACTAM <=2 SENSITIVE Sensitive     PIP/TAZO <=4 SENSITIVE Sensitive     Extended ESBL NEGATIVE Sensitive     *  ESCHERICHIA COLI  Blood Culture ID Panel (Reflexed)     Status: Abnormal   Collection Time: 08/21/17  1:23 PM  Result Value Ref Range Status   Enterococcus species NOT DETECTED NOT DETECTED Final   Vancomycin resistance NOT DETECTED NOT DETECTED Final   Listeria monocytogenes NOT DETECTED NOT DETECTED Final   Staphylococcus species NOT DETECTED NOT DETECTED Final   Staphylococcus aureus NOT DETECTED NOT DETECTED Final   Methicillin resistance NOT DETECTED NOT DETECTED Final   Streptococcus species NOT DETECTED  NOT DETECTED Final   Streptococcus agalactiae NOT DETECTED NOT DETECTED Final   Streptococcus pneumoniae NOT DETECTED NOT DETECTED Final   Streptococcus pyogenes NOT DETECTED NOT DETECTED Final   Acinetobacter baumannii NOT DETECTED NOT DETECTED Final   Enterobacteriaceae species DETECTED (Kennidy Lamke) NOT DETECTED Final    Comment: CRITICAL RESULT CALLED TO, READ BACK BY AND VERIFIED WITH: PHARMD J GADHIA [id number] MLM    Enterobacter cloacae complex NOT DETECTED NOT DETECTED Final   Escherichia coli DETECTED (Emre Stock) NOT DETECTED Final    Comment: CRITICAL RESULT CALLED TO, READ BACK BY AND VERIFIED WITH: PHARMD J GADHIA [id number] MLM    Klebsiella oxytoca NOT DETECTED NOT DETECTED Final   Klebsiella pneumoniae NOT DETECTED NOT DETECTED Final   Proteus species NOT DETECTED NOT DETECTED Final   Serratia marcescens NOT DETECTED NOT DETECTED Final   Carbapenem resistance NOT DETECTED NOT DETECTED Final   Haemophilus influenzae NOT DETECTED NOT DETECTED Final   Neisseria meningitidis NOT DETECTED NOT DETECTED Final   Pseudomonas aeruginosa NOT DETECTED NOT DETECTED Final   Candida albicans NOT DETECTED NOT DETECTED Final   Candida glabrata NOT DETECTED NOT DETECTED Final   Candida krusei NOT DETECTED NOT DETECTED Final   Candida parapsilosis NOT DETECTED NOT DETECTED Final   Candida tropicalis NOT DETECTED NOT DETECTED Final    Comment: Performed at [facility], [address], [place], [place] [address]  Culture, blood (Routine x 2)     Status: Abnormal   Collection Time: 08/21/17  2:07 PM  Result Value Ref Range Status   Specimen Description BLOOD LEFT FOREARM  Final   Special Requests   Final    BOTTLES DRAWN AEROBIC AND ANAEROBIC Blood Culture adequate volume   Culture  Setup Time   Final    GRAM NEGATIVE RODS AEROBIC BOTTLE ONLY CRITICAL VALUE NOTED.  VALUE IS CONSISTENT WITH PREVIOUSLY REPORTED AND CALLED VALUE.    Culture (Ofelia Podolski)  Final    ESCHERICHIA COLI SUSCEPTIBILITIES  PERFORMED ON PREVIOUS CULTURE WITHIN THE LAST 5 DAYS. Performed at [facility], [place] [address], [place], [place] [address]    Report Status 08/24/2017 FINAL  Final  Urine culture     Status: Abnormal   Collection Time: 08/21/17  2:50 PM  Result Value Ref Range Status   Specimen Description URINE, RANDOM  Final   Special Requests NONE  Final   Culture MULTIPLE SPECIES PRESENT, SUGGEST RECOLLECTION (Saphyra Hutt)  Final   Report Status 08/22/2017 FINAL  Final  MRSA PCR Screening     Status: Abnormal   Collection Time: 08/21/17  4:25 PM  Result Value Ref Range Status   MRSA by PCR POSITIVE (Tesla Bochicchio) NEGATIVE Final    Comment:        The GeneXpert MRSA Assay (FDA approved for NASAL specimens only), is one component of Logyn Kendrick comprehensive MRSA colonization surveillance program. It is not intended to diagnose MRSA infection nor to guide or monitor treatment for MRSA infections.  RESULT CALLED TO, READ BACK BY AND VERIFIED WITH: S.KOTIAN RN AT 1936 ON 08/21/17 BY S.VANHOORNE MLS   Gastrointestinal Panel by PCR , Stool     Status: None   Collection Time: 08/21/17  6:25 PM  Result Value Ref Range Status   Campylobacter species NOT DETECTED NOT DETECTED Final   Plesimonas shigelloides NOT DETECTED NOT DETECTED Final   Salmonella species NOT DETECTED NOT DETECTED Final   Yersinia enterocolitica NOT DETECTED NOT DETECTED Final   Vibrio species NOT DETECTED NOT DETECTED Final   Vibrio cholerae NOT DETECTED NOT DETECTED Final   Enteroaggregative E coli (EAEC) NOT DETECTED NOT DETECTED Final   Enteropathogenic E coli (EPEC) NOT DETECTED NOT DETECTED Final   Enterotoxigenic E coli (ETEC) NOT DETECTED NOT DETECTED Final   Shiga like toxin producing E coli (STEC) NOT DETECTED NOT DETECTED Final   Shigella/Enteroinvasive E coli (EIEC) NOT DETECTED NOT DETECTED Final   Cryptosporidium NOT DETECTED NOT DETECTED Final   Cyclospora cayetanensis NOT DETECTED NOT DETECTED Final   Entamoeba histolytica NOT  DETECTED NOT DETECTED Final   Giardia lamblia NOT DETECTED NOT DETECTED Final   Adenovirus F40/41 NOT DETECTED NOT DETECTED Final   Astrovirus NOT DETECTED NOT DETECTED Final   Norovirus GI/GII NOT DETECTED NOT DETECTED Final   Rotavirus Waneda Klammer NOT DETECTED NOT DETECTED Final   Sapovirus (I, II, IV, and V) NOT DETECTED NOT DETECTED Final  Urine culture     Status: None   Collection Time: 08/21/17 11:15 PM  Result Value Ref Range Status   Specimen Description URINE, SUPRAPUBIC RIGHT KIDNEY  Final   Special Requests Immunocompromised  Final   Culture   Final    NO GROWTH Performed at [facility], [address], [place], [place] [address]    Report Status 08/23/2017 FINAL  Final  Culture, blood (routine x 2)     Status: None (Preliminary result)   Collection Time: 08/25/17  4:43 PM  Result Value Ref Range Status   Specimen Description BLOOD PORTA CATH  Final   Special Requests   Final    BOTTLES DRAWN AEROBIC AND ANAEROBIC Blood Culture adequate volume   Culture PENDING  Incomplete   Report Status PENDING  Incomplete         Radiology Studies: No results found.      Scheduled Meds: . chlorhexidine  15 mL Mouth Rinse BID  . Chlorhexidine Gluconate Cloth  6 each Topical Daily  . feeding supplement (ENSURE ENLIVE)  237 mL Oral BID BM  . hydrocortisone sod succinate (SOLU-CORTEF) inj  50 mg Intravenous Q12H  . mouth rinse  15 mL Mouth Rinse q12n4p  . mupirocin ointment  1 application Nasal BID  . pantoprazole  40 mg Oral Daily  . potassium chloride  40 mEq Oral BID  . sodium chloride flush  10-40 mL Intracatheter Q12H  . sodium chloride flush  5 mL Intravenous Q8H   Continuous Infusions: . calcium gluconate    . cefTRIAXone (ROCEPHIN)  IV 2 g (08/26/17 1115)  . lactated ringers 75 mL/hr at 08/26/17 0973  . magnesium sulfate 1 - 4 g bolus IVPB    . potassium PHOSPHATE IVPB (mEq)       LOS: 5 days    Time spent: over 30 min    Fayrene Helper, MD [facility] [phone]   If 7PM-7AM, please contact night-coverage [email or web address] Password [email or web address] 08/26/2017, 1:44 PM

## 2017-08-26 NOTE — Progress Notes (Signed)
Initial Nutrition Assessment  DOCUMENTATION CODES:   Not applicable  INTERVENTION:   Ensure Enlive po BID, each supplement provides 350 kcal and 20 grams of protein  Pt is at high risk for refeeding. Monitor and supplement electrolytes as need per MD descretion.   NUTRITION DIAGNOSIS:   Unintentional weight loss related to cancer and cancer related treatments, catabolic illness as evidenced by 17% weight loss in 2 months.  GOAL:   Patient will meet greater than or equal to 90% of their needs  MONITOR:   PO intake, Supplement acceptance, Labs, Weight trends  REASON FOR ASSESSMENT:   Consult Assessment of nutrition requirement/status  ASSESSMENT:   Pt with PMH significant for diffuse large B-cell lymphoma (currently on chemotherapy, last treatment two weeks ago). Presents this admission with new onset diarrhea without melena and febrile neutropenia.   Attempted to speak with pt at bedside. Pt aggitated and unwilling to answer questions. Reports having a loss in appetite this hospital stay but not prior to admission. When probed further pt unwilling to provide more information. Pt upgraded to regular diet 9/19 and has consumed an average of 10% of his last two meals. Records indicate pt has lost 17% of body wt in 2 months. This is significant. Given this unintentional wt loss suspect pt has not had adequate nutrition for a prolonged period, but hard to quantify given limited history provided by pt. Nutrition-Focused physical exam completed. Findings are no fat depletion, moderate muscle depletion in temple region, and mild edema in bilateral lower extremites. Suspect pt has some form of malnutrition at this time. Cannot not diagnose with limited history and unremarkable NFPE. Will try to obtain more information from pt/family upon follow up.   Medications reviewed and include: solumedrol, IV abx, LR @ 75 ml/hr Labs reviewed: K 2.9 (L) Creatinine 0.44 (L), Calcium 6.5 (L) Phos 1.7 (L)  Mg 1.6 (L) ALP 138 (H) Albumin 2.3 (L)  Diet Order:  Diet regular Room service appropriate? Yes; Fluid consistency: Thin  Skin:  Reviewed, no issues  Last BM:  08/24/17  Height:   Ht Readings from Last 1 Encounters:  08/21/17 6\' 2"  (1.88 m)    Weight:   Wt Readings from Last 1 Encounters:  08/21/17 168 lb 14 oz (76.6 kg)    Ideal Body Weight:  86.3 kg  BMI:  Body mass index is 21.68 kg/m.  Estimated Nutritional Needs:   Kcal:  2300-2500 (30-33 kcal/kg)  Protein:  125-135 grams (1.6-1.8 g/kg)  Fluid:  >2.3 L/day  EDUCATION NEEDS:   No education needs identified at this time  [place], LDN Clinical Nutrition Pager # - [phone]

## 2017-08-26 NOTE — Progress Notes (Signed)
This shift pt refusing PO Potassium pills and stated he will not take it in liquid form either. Pt educated, but still refused. K+ level this a.m 2.9. Hospitalist notified. New orders given for potassium x4 runs. Will continue to monitor

## 2017-08-26 NOTE — Evaluation (Signed)
Physical Therapy Evaluation Patient Details Name: Cory Ibarra MRN: [id number] DOB: January 10, 1961 Today's Date: 08/26/2017   History of Present Illness  56 yo male presented with fever, n/v, hematuria, diarrhea.  Found to have multiple renal calculi with Rt ureteral calculus.  Required pressors in setting of septic shock and PCCM asked to assess.  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 Pt currently with functional limitations due to the deficits listed below (see PT Problem List).  Pt will benefit from skilled PT to increase their independence and safety with mobility to allow discharge to the venue listed below.    **pt demonstrates extremely flat affect and does not make eye contact unless directed to do so; he demo's decr insight into and decr awareness of  his current state of health/level assist needed and is unrealistic about his D/C plan; Will continue PT efforts but mobility is greatly limited by pt cognition; family (brother and friend) present and supportive    Follow Up Recommendations SNF;Supervision/Assistance - 24 hour    Equipment Recommendations  Other (comment);None recommended by PT (if home will need additional DME)    Recommendations for Other Services       Precautions / Restrictions Precautions Precautions: Fall Restrictions Weight Bearing Restrictions: No      Mobility  Bed Mobility Overal bed mobility: Needs Assistance Bed Mobility: Rolling;Supine to Sit;Sit to Supine           General bed mobility comments: attempted to sit EOB, pt agreeable and then refused when initiated; pt does not follow commands consistently or attempt to assist self, rather he is resistant to imposed movement;   Transfers                    Ambulation/Gait                Stairs            Wheelchair Mobility    Modified Rankin (Stroke Patients Only)        Balance Overall balance assessment:  (unable to assess)                                           Pertinent Vitals/Pain Pain Assessment: No/denies pain    Home Living Family/patient expects to be discharged to:: Private residence Living Arrangements: Other relatives (brother) Available Help at Discharge: Family Type of Home: House Home Access: Stairs to enter;Ramped entrance     Home Layout: One level Home Equipment: Environmental consultant - 2 wheels      Prior Function                 Hand Dominance        Extremity/Trunk Assessment   Upper Extremity Assessment Upper Extremity Assessment: Difficult to assess due to impaired cognition    Lower Extremity Assessment Lower Extremity Assessment: Generalized weakness;Difficult to assess due to impaired cognition (2+/ 5 for hip flexion and knee extension, cognitive component possibly limiting )       Communication      Cognition Arousal/Alertness: Awake/alert Behavior During Therapy: Flat affect Overall Cognitive Status: Impaired/Different from baseline Area of Impairment: Following commands;Safety/judgement;Problem solving                       Following Commands:  Follows one step commands inconsistently Safety/Judgement: Decreased awareness of safety;Decreased awareness of deficits   Problem Solving: Slow processing;Decreased initiation;Difficulty sequencing;Requires verbal cues;Requires tactile cues General Comments: pt demonstrates extremely flat affect and does not make eye contact unless directed to do so; he demo's decr insight into and awareness of  his current state of health/level assist needed and is unrealistic about his D/C plan;      General Comments      Exercises     Assessment/Plan    PT Assessment Patient needs continued PT services  PT Problem List Decreased strength;Decreased activity tolerance;Decreased mobility       PT Treatment Interventions DME  instruction;Therapeutic activities;Therapeutic exercise;Patient/family education;Functional mobility training    PT Goals (Current goals can be found in the Care Plan section)  Acute Rehab PT Goals Patient Stated Goal: "home" PT Goal Formulation: With patient/family Time For Goal Achievement: 09/08/17 Potential to Achieve Goals: Fair    Frequency Min 3X/week   Barriers to discharge        Co-evaluation               AM-PAC PT "6 Clicks" Daily Activity  Outcome Measure Difficulty turning over in bed (including adjusting bedclothes, sheets and blankets)?: Unable Difficulty moving from lying on back to sitting on the side of the bed? : Unable Difficulty sitting down on and standing up from a chair with arms (e.g., wheelchair, bedside commode, etc,.)?: Unable Help needed moving to and from a bed to chair (including a wheelchair)?: Total Help needed walking in hospital room?: Total Help needed climbing 3-5 steps with a railing? : Total 6 Click Score: 6    End of Session   Activity Tolerance: Other (comment) (limited by cognition) Patient left: in bed;with call bell/phone within reach;with bed alarm set;with family/visitor present Nurse Communication: Mobility status PT Visit Diagnosis: Muscle weakness (generalized) (M62.81)    Time: 6812-7517 PT Time Calculation (min) (ACUTE ONLY): 23 min   Charges:   PT Evaluation $PT Eval Moderate Complexity: 1 Mod PT Treatments $Therapeutic Activity: 8-22 mins   PT G CodesKenyon Ana, PT Pager: [phone] 08/26/2017   [facility] 08/26/2017, 5:06 PM

## 2017-08-26 NOTE — Progress Notes (Signed)
Referring Physician(s): Dr Janyce Llanos  Supervising Physician: Aletta Edouard  Patient Status:  [facility] - In-pt  Chief Complaint:  Rt Hydronephrosis  Subjective:  Rt PCN in place 9/18 Draining well CR stable  Allergies: Bee venom  Medications: Prior to Admission medications   Medication Sig Start Date End Date Taking? Authorizing Provider  allopurinol (ZYLOPRIM) 300 MG tablet Take 1 tablet (300 mg total) by mouth daily. 07/16/17 08/21/17 Yes Perlov, Marinell Blight, MD  dexamethasone (DECADRON) 4 MG tablet Take 4 mg by mouth every other day.    Yes [provider]  lidocaine-prilocaine (EMLA) cream Apply to affected area once 07/24/17  Yes Perlov, Marinell Blight, MD  LORazepam (ATIVAN) 0.5 MG tablet Take 1 tablet (0.5 mg total) by mouth every 6 (six) hours as needed (Nausea or vomiting). 07/24/17  Yes Perlov, Marinell Blight, MD  oxyCODONE (OXY IR/ROXICODONE) 5 MG immediate release tablet Take 1 tablet (5 mg total) by mouth every 4 (four) hours as needed for moderate pain or severe pain. 08/13/17 09/03/17 Yes Hayden Pedro, PA-C  pantoprazole (PROTONIX) 40 MG tablet Take 1 tablet (40 mg total) by mouth daily at 12 noon. 08/13/17  Yes Hayden Pedro, PA-C  polyethylene glycol [facility] / Floria Raveling) packet Take 17 g by mouth daily as needed for mild constipation. 07/03/17  Yes Domenic Polite, MD  ondansetron (ZOFRAN) 8 MG tablet Take 1 tablet (8 mg total) by mouth 2 (two) times daily as needed for refractory nausea / vomiting. Start on day 3 after cyclophosphamide chemotherapy. Patient not taking: Reported on 08/21/2017 07/24/17   Ardath Sax, MD     Vital Signs: BP 128/72 (BP Location: Right Arm)   Pulse 94   Temp 98.4 F (36.9 C) (Oral)   Resp 18   Ht 6\' 2"  (1.88 m)   Wt 168 lb 14 oz (76.6 kg)   SpO2 94%   BMI 21.68 kg/m   Physical Exam  Neurological: He is alert.  Skin: Skin is warm and dry.  Site is clean and dry NT No bleeding OP dark yellow 700 cc  yesterday 150 cc in bag now  Nursing note and vitals reviewed.   Imaging: No results found.  Labs:  CBC:  Recent Labs  08/24/17 0453 08/24/17 1207 08/25/17 1042 08/26/17 0322  WBC 2.8* 5.3 9.1 20.0*  HGB 7.7* 8.7* 8.2* 8.7*  HCT 21.9* 24.9* 23.6* 25.6*  PLT 48* 57* 54* 66*    COAGS:  Recent Labs  06/29/17 0847 07/26/17 1049 08/21/17 1316  INR 1.06 1.13 1.23  APTT  --  29  --     BMP:  Recent Labs  08/24/17 0453 08/24/17 1805 08/25/17 0315 08/26/17 0322  NA 139 140 143 144  K 2.7* 3.4* 3.6 2.9*  CL 107 108 110 111  CO2 22 23 23 25   GLUCOSE 93 97 97 98  BUN 11 9 9 9   CALCIUM 6.3* 6.6* 6.7* 6.5*  CREATININE 0.38* 0.35* 0.41* 0.44*  GFRNONAA >60 >60 >60 >60  GFRAA >60 >60 >60 >60    LIVER FUNCTION TESTS:  Recent Labs  08/08/17 1329 08/16/17 0924 08/21/17 1316 08/23/17 0448 08/26/17 0322  BILITOT 0.85 0.89 2.2*  --  1.3*  AST 22 27 30   --  28  ALT 22 59* 41  --  107*  ALKPHOS 106 110 112  --  138*  PROT 6.9 6.4 5.9*  --  4.7*  ALBUMIN 3.4* 3.4* 3.1* 2.3* 2.3*    Assessment and  Plan:  Rt PCN intact OP great Plan per Oncology/TRH   Electronically Signed: Jaziya Obarr A, PA-C 08/26/2017, 9:41 AM   I spent a total of 15 Minutes at the the patient's bedside AND on the patient's hospital floor or unit, greater than 50% of which was counseling/coordinating care for Rt PCN

## 2017-08-27 DIAGNOSIS — D649 Anemia, unspecified: Secondary | ICD-10-CM

## 2017-08-27 DIAGNOSIS — E46 Unspecified protein-calorie malnutrition: Secondary | ICD-10-CM

## 2017-08-27 LAB — CBC WITH DIFFERENTIAL/PLATELET
BLASTS: 0 %
Band Neutrophils: 0 %
Basophils Absolute: 0 10*3/uL (ref 0.0–0.1)
Basophils Relative: 0 %
Eosinophils Absolute: 0 10*3/uL (ref 0.0–0.7)
Eosinophils Relative: 0 %
HEMATOCRIT: 25.2 % — AB (ref 39.0–52.0)
Hemoglobin: 8.7 g/dL — ABNORMAL LOW (ref 13.0–17.0)
LYMPHS ABS: 0.7 10*3/uL (ref 0.7–4.0)
LYMPHS PCT: 5 %
MCH: 25.4 pg — ABNORMAL LOW (ref 26.0–34.0)
MCHC: 34.5 g/dL (ref 30.0–36.0)
MCV: 73.5 fL — AB (ref 78.0–100.0)
MONOS PCT: 8 %
Metamyelocytes Relative: 0 %
Monocytes Absolute: 1.1 10*3/uL — ABNORMAL HIGH (ref 0.1–1.0)
Myelocytes: 0 %
NEUTROS ABS: 11.6 10*3/uL — AB (ref 1.7–7.7)
NEUTROS PCT: 87 %
Platelets: 63 10*3/uL — ABNORMAL LOW (ref 150–400)
Promyelocytes Absolute: 0 %
RBC: 3.43 MIL/uL — AB (ref 4.22–5.81)
RDW: 18.7 % — AB (ref 11.5–15.5)
WBC: 13.4 10*3/uL — AB (ref 4.0–10.5)
nRBC: 0 /100 WBC

## 2017-08-27 LAB — COMPREHENSIVE METABOLIC PANEL
ALT: 73 U/L — ABNORMAL HIGH (ref 17–63)
ANION GAP: 8 (ref 5–15)
AST: 20 U/L (ref 15–41)
Albumin: 2.3 g/dL — ABNORMAL LOW (ref 3.5–5.0)
Alkaline Phosphatase: 126 U/L (ref 38–126)
BUN: 7 mg/dL (ref 6–20)
CO2: 27 mmol/L (ref 22–32)
Calcium: 6.3 mg/dL — CL (ref 8.9–10.3)
Chloride: 105 mmol/L (ref 101–111)
Creatinine, Ser: 0.31 mg/dL — ABNORMAL LOW (ref 0.61–1.24)
GFR calc non Af Amer: >60 mL/min (ref >60)
Glucose, Bld: 92 mg/dL (ref 65–99)
Potassium: 3 mmol/L — ABNORMAL LOW (ref 3.5–5.1)
SODIUM: 140 mmol/L (ref 135–145)
Total Bilirubin: 1.2 mg/dL (ref 0.3–1.2)
Total Protein: 4.6 g/dL — ABNORMAL LOW (ref 6.5–8.1)

## 2017-08-27 LAB — PHOSPHORUS: PHOSPHORUS: 1.8 mg/dL — AB (ref 2.5–4.6)

## 2017-08-27 LAB — MAGNESIUM: Magnesium: 1.9 mg/dL (ref 1.7–2.4)

## 2017-08-27 MED ORDER — SODIUM CHLORIDE 0.9 % IV SOLN
2.0000 g | Freq: Once | INTRAVENOUS | Status: DC
  Filled 2017-08-27: qty 20

## 2017-08-27 MED ORDER — MIRTAZAPINE 15 MG PO TABS
7.5000 mg | ORAL_TABLET | Freq: Every day | ORAL | Status: DC
  Administered 2017-08-28: 7.5 mg via ORAL
  Filled 2017-08-27 (×2): qty 1

## 2017-08-27 MED ORDER — SODIUM CHLORIDE 0.9 % IV SOLN
1.0000 g | Freq: Once | INTRAVENOUS | Status: AC
  Administered 2017-08-27: 1 g via INTRAVENOUS
  Filled 2017-08-27: qty 10

## 2017-08-27 MED ORDER — POTASSIUM PHOSPHATES 15 MMOLE/5ML IV SOLN
30.0000 mmol | Freq: Once | INTRAVENOUS | Status: AC
  Administered 2017-08-27: 30 mmol via INTRAVENOUS
  Filled 2017-08-27: qty 10

## 2017-08-27 MED ORDER — POTASSIUM CHLORIDE CRYS ER 20 MEQ PO TBCR: 40.0000 meq | EXTENDED_RELEASE_TABLET | Freq: Two times a day (BID) | ORAL | Status: DC

## 2017-08-27 MED ORDER — POTASSIUM CHLORIDE 10 MEQ/100ML IV SOLN
10.0000 meq | INTRAVENOUS | Status: AC
  Administered 2017-08-27 (×6): 10 meq via INTRAVENOUS
  Filled 2017-08-27 (×6): qty 100

## 2017-08-27 MED ORDER — THIAMINE HCL 100 MG/ML IJ SOLN
100.0000 mg | Freq: Every day | INTRAMUSCULAR | Status: DC
  Administered 2017-08-27 – 2017-08-28 (×2): 100 mg via INTRAVENOUS
  Filled 2017-08-27 (×2): qty 2

## 2017-08-27 NOTE — Evaluation (Signed)
Occupational Therapy Evaluation Patient Details Name: Cory Ibarra MRN: [id number] DOB: Oct 09, 1961 Today's Date: 08/27/2017    History of Present Illness 56 yo male presented with fever, n/v, hematuria, diarrhea.  Found to have multiple renal calculi with Rt ureteral calculus.  Required pressors in setting of septic shock and PCCM asked to assess.  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 Pt currently with functional limitations due to the deficits listed below (see OT Problem List).  Pt will benefit from skilled OT to increase their safety and independence with ADL and functional mobility for ADL to facilitate discharge to venue listed below.      Follow Up Recommendations  SNF    Equipment Recommendations  None recommended by OT       Precautions / Restrictions Precautions Precautions: Fall      Mobility Bed Mobility Overal bed mobility: Needs Assistance Bed Mobility: Rolling;Supine to Sit Rolling: Min assist   Supine to sit: Mod assist        Transfers Overall transfer level: Needs assistance Equipment used: Rolling walker (2 wheeled) Transfers: Sit to/from [facility] Sit to Stand: Min assist;Mod assist Stand pivot transfers: Min assist;Mod assist                ADL either performed or assessed with clinical judgement   ADL Overall ADL's : Needs assistance/impaired Eating/Feeding: Set up;Sitting   Grooming: Minimal assistance;Sitting;Cueing for safety;Cueing for sequencing   Upper Body Bathing: Minimal assistance;Sitting   Lower Body Bathing: Maximal assistance   Upper Body Dressing : Minimal assistance;Sitting   Lower Body Dressing: Maximal assistance;Sit to/from stand;Cueing for safety;Cueing for sequencing   Toilet Transfer: Moderate assistance;RW;Ambulation;Cueing for sequencing;Cueing for safety Toilet Transfer Details (indicate cue type  and reason): bed to chair Toileting- Clothing Manipulation and Hygiene: Maximal assistance;Sit to/from stand;Cueing for safety;Cueing for sequencing         General ADL Comments: spoke with pts brother - he feels and OT agrees SNF will be needed upon DC     Vision Patient Visual Report: No change from baseline              Pertinent Vitals/Pain Pain Assessment: No/denies pain        Extremity/Trunk Assessment Upper Extremity Assessment Upper Extremity Assessment: Generalized weakness           Communication Communication Communication: No difficulties   Cognition Arousal/Alertness: Awake/alert Behavior During Therapy: Flat affect Overall Cognitive Status: Within Functional Limits for tasks assessed                                                Home Living Family/patient expects to be discharged to:: Private residence Living Arrangements: Other relatives (brother) Available Help at Discharge: Family Type of Home: House Home Access: Stairs to enter;Ramped entrance     Home Layout: One level     Bathroom Shower/Tub: Teacher, early years/pre: Standard (comfort height)     Home Equipment: Environmental consultant - 2 wheels          Prior Functioning/Environment Level of Independence: Independent                 OT Problem List: Decreased strength;Decreased activity tolerance;Pain      OT Treatment/Interventions: Self-care/ADL training;Patient/family  education;DME and/or AE instruction    OT Goals(Current goals can be found in the care plan section) Acute Rehab OT Goals Patient Stated Goal: "home" OT Goal Formulation: With patient Time For Goal Achievement: 09/10/17  OT Frequency: Min 2X/week   Barriers to D/C:            Co-evaluation              AM-PAC PT "6 Clicks" Daily Activity     Outcome Measure Help from another person eating meals?: A Little Help from another person taking care of personal grooming?: A  Little Help from another person toileting, which includes using toliet, bedpan, or urinal?: A Lot Help from another person bathing (including washing, rinsing, drying)?: A Lot Help from another person to put on and taking off regular upper body clothing?: A Lot Help from another person to put on and taking off regular lower body clothing?: A Lot 6 Click Score: 14   End of Session Equipment Utilized During Treatment: Rolling walker Nurse Communication: Mobility status  Activity Tolerance: Patient tolerated treatment well Patient left: in chair;with call bell/phone within reach;with family/visitor present  OT Visit Diagnosis: Unsteadiness on feet (R26.81)                Time: 0626-9485 OT Time Calculation (min): 42 min Charges:  OT General Charges $OT Visit: 1 Visit OT Evaluation $OT Eval Moderate Complexity: 1 Mod OT Treatments $Self Care/Home Management : 23-37 mins G-Codes:     Kari Baars, OT [phone]  Payton Mccallum D 08/27/2017, 4:08 PM

## 2017-08-27 NOTE — Progress Notes (Signed)
Pt yelling out " I want to go home". This writer went into the room and informed patient that the doctor will be around to evaluate him today. Patient with flat affect. Pt refusing all oral medications. Educated on the importance of taking his medications. Pt still refusing.

## 2017-08-27 NOTE — Progress Notes (Signed)
PROGRESS NOTE    Cory Ibarra  XTG:[id number] DOB: 03-Apr-1961 DOA: 08/21/2017 PCP: [name], [name]    Brief Narrative: 56 yo male presented with fever, n/v, hematuria, diarrhea.  Found to have multiple renal calculi with Rt ureteral calculus.  Required pressors in setting of septic shock and PCCM asked to assess.  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   Assessment & Plan:   Active Problems:   Neutropenia (HCC)   Gross hematuria   Lactic acidosis   Neutropenic sepsis ([place])   Septic shock due to Escherichia coli ([place])   Sepsis secondary to e. Coli bacteremia  Obstructed R ureteral Stone:  Now s/p perc nephrostomy.  Pressors now d/c'd. - WBC elevated today, likely 2/2 neuopogen recently d/c'd - s/p perc nephrostomy tube (urology unable to place retrograde stend due to severe impaction)   - ceftriaxone 2 grams daily (9/19 - ) (vanc 9/18-9/19; zosyn 9/18-9/19) - ucx multiple species, blood cx x2 e. 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   Elevated troponin: on presentation, 2/2 demand ischemia  Diarrhea: improved, GI path panel on admission negative  Malnutrition:  Low albumin, refusing meals.  Did not participate with discussion with dietician.  Encouraged him to take PO   Concern for depression:  He's refused to get out of bed and participate with nursing at times.  He's not eating  his meals.  He denies depression, but I spoke to Dr. [name] who notes this is significantly different for him.  Encouraged participation and eating.  Discussed med to stimulate appetite/depression and he declines this.  I think we may need psychiatry consult, but will see how he does later today.   DVT prophylaxis: SCD Code Status: Partial, no CPR Family Communication: none in room Disposition Plan: pending improvement, PT/OT eval   Consultants:   PCCM, Urology, IR, Oncology  Procedures: (Don't include imaging studies which can be auto populated. Include things that cannot be auto populated i.e. Echo, Carotid and venous dopplers, Foley, Bipap, HD, tubes/drains, wound vac, central lines etc)  Perc nephrostomy tube placement  R cystoscopy with retrograde pyelogram, unsuccessful stent attempt  Antimicrobials: (specify start and planned stop date. Auto populated tables are space occupying and do not give end dates) Anti-infectives    Start     Dose/Rate Route Frequency Ordered Stop   08/22/17 1000  cefTRIAXone (ROCEPHIN) 2 g in dextrose 5 % 50 mL IVPB     2 g 100 mL/hr over 30 Minutes Intravenous Every 24 hours 08/22/17 0836     08/22/17 0000  vancomycin (VANCOCIN) IVPB 750 mg/150 ml premix  Status:  Discontinued     750 mg 150 mL/hr over 60 Minutes Intravenous Every 8 hours 08/21/17 1858 08/22/17 0836   08/21/17 2200  piperacillin-tazobactam (ZOSYN) IVPB 3.375 g  Status:  Discontinued     3.375 g 12.5 mL/hr over 240 Minutes Intravenous Every 8 hours 08/21/17  1858 08/22/17 0836   08/21/17 1530  vancomycin (VANCOCIN) 1,500 mg in sodium chloride 0.9 % 500 mL IVPB     1,500 mg 250 mL/hr over 120 Minutes Intravenous  Once 08/21/17 1525 08/21/17 1745   08/21/17 1500  piperacillin-tazobactam (ZOSYN) IVPB 3.375 g     3.375 g 100 mL/hr over 30 Minutes Intravenous  Once 08/21/17 1451 08/21/17 1550   08/21/17 1500  vancomycin (VANCOCIN) IVPB 1000 mg/200 mL premix  Status:  Discontinued      1,000 mg 200 mL/hr over 60 Minutes Intravenous  Once 08/21/17 1451 08/21/17 1525       Subjective: Ready to go home.    Objective: Vitals:   08/26/17 0615 08/26/17 1118 08/26/17 2018 08/27/17 0626  BP: 128/72 136/77 134/64 135/70  Pulse: 94 93 81 86  Resp: 18 18 19 18   Temp: 98.4 F (36.9 C) 98.3 F (36.8 C) 98.4 F (36.9 C) (!) 97.5 F (36.4 C)  TempSrc: Oral Oral Oral Oral  SpO2: 94% 94% 96% 95%  Weight:      Height:        Intake/Output Summary (Last 24 hours) at 08/27/17 1218 Last data filed at 08/27/17 0931  Gross per 24 hour  Intake              800 ml  Output             2525 ml  Net            -1725 ml   Filed Weights   08/21/17 1216 08/21/17 1441 08/21/17 1643  Weight: 81.6 kg (180 lb) 81.6 kg (180 lb) 76.6 kg (168 lb 14 oz)    Examination:  General: No acute distress.  Does not participate very well with exam.  Cardiovascular: Heart sounds show [name] regular rate, and rhythm. No gallops or rubs. No murmurs. No JVD. Lungs: Clear to auscultation bilaterally with good air movement. No rales, rhonchi or wheezes. Abdomen: Soft, nontender, nondistended with normal active bowel sounds. No masses. No hepatosplenomegaly. Neurological: Alert and oriented 3. Moves all extremities 4 . Cranial nerves II through XII grossly intact. Skin: Warm and dry. No rashes or lesions. Extremities: No clubbing or cyanosis. No edema. Pedal pulses 2+. Psychiatric: states mood is normal, but affect is depressed.   Data Reviewed: I have personally reviewed following labs and imaging studies  CBC:  Recent Labs Lab 08/23/17 0434 08/24/17 0453 08/24/17 1207 08/25/17 1042 08/26/17 0322 08/27/17 0348  WBC 0.8* 2.8* 5.3 9.1 20.0* 13.4*  NEUTROABS 0.6*  --  4.5 8.4* 18.8* 11.6*  HGB 7.3* 7.7* 8.7* 8.2* 8.7* 8.7*  HCT 21.0* 21.9* 24.9* 23.6* 25.6* 25.2*  MCV 73.2* 72.0* 73.7* 74.2* 74.9* 73.5*  PLT 42* 48* 57* 54* 66* 63*   Basic Metabolic Panel:  Recent Labs Lab  08/22/17 0332 08/23/17 0434 08/24/17 0453 08/24/17 1805 08/25/17 0315 08/25/17 1042 08/26/17 0322 08/27/17 0348  NA 134* 134* 139 140 143  --  144 140  K 3.2* 3.1* 2.7* 3.4* 3.6  --  2.9* 3.0*  CL 107 105 107 108 110  --  111 105  CO2 14* 20* 22 23 23   --  25 27  GLUCOSE 183* 102* 93 97 97  --  98 92  BUN 16 18 11 9 9   --  9 7  CREATININE 0.75 0.46* 0.38* 0.35* 0.41*  --  0.44* 0.31*  CALCIUM 5.9* 6.0* 6.3* 6.6* 6.7*  --  6.5* 6.3*  MG 1.8 1.7  --   --  1.8  --  1.6* 1.9  PHOS 3.4  --   --   --  <1.0* <1.0* 1.7* 1.8*   GFR: Estimated Creatinine Clearance: 109 mL/min ([employer]) (by C-G formula based on SCr of 0.31 mg/dL (L)). Liver Function Tests:  Recent Labs Lab 08/21/17 1316 08/23/17 0448 08/26/17 0322 08/27/17 0348  AST 30  --  28 20  ALT 41  --  107* 73*  ALKPHOS 112  --  138* 126  BILITOT 2.2*  --  1.3* 1.2  PROT 5.9*  --  4.7* 4.6*  ALBUMIN 3.1* 2.3* 2.3* 2.3*   No results for input(s): LIPASE, AMYLASE in the last 168 hours. No results for input(s): AMMONIA in the last 168 hours. Coagulation Profile:  Recent Labs Lab 08/21/17 1316  INR 1.23   Cardiac Enzymes:  Recent Labs Lab 08/21/17 1316 08/21/17 2249 08/22/17 0332  CKTOTAL 65  --   --   TROPONINI  --  0.98* 0.88*   BNP (last 3 results) No results for input(s): PROBNP in the last 8760 hours. HbA1C: No results for input(s): HGBA1C in the last 72 hours. CBG:  Recent Labs Lab 08/21/17 2338 08/22/17 0312 08/22/17 0750 08/22/17 1203 08/22/17 1641  GLUCAP 105* 179* 120* 122* 110*   Lipid Profile: No results for input(s): CHOL, HDL, LDLCALC, TRIG, CHOLHDL, LDLDIRECT in the last 72 hours. Thyroid Function Tests: No results for input(s): TSH, T4TOTAL, FREET4, T3FREE, THYROIDAB in the last 72 hours. Anemia Panel: No results for input(s): VITAMINB12, FOLATE, FERRITIN, TIBC, IRON, RETICCTPCT in the last 72 hours. Sepsis Labs:  Recent Labs Lab 08/21/17 2249 08/22/17 0332 08/22/17 0414  08/22/17 0836 08/23/17 0434  PROCALCITON 67.99 69.28  --   --  33.06  LATICACIDVEN 3.6*  --  3.3* 3.2* 1.9    Recent Results (from the past 240 hour(s))  Culture, blood (Routine x 2)     Status: Abnormal   Collection Time: 08/21/17  1:23 PM  Result Value Ref Range Status   Specimen Description BLOOD RIGHT PORTA CATH  Final   Special Requests   Final    BOTTLES DRAWN AEROBIC AND ANAEROBIC Blood Culture adequate volume   Culture  Setup Time   Final    GRAM NEGATIVE RODS IN BOTH AEROBIC AND ANAEROBIC BOTTLES CRITICAL RESULT CALLED TO, READ BACK BY AND VERIFIED WITH: Damian Leavell [id number] MLM Performed at [facility], [place] [address] [place], [place] [address]    Culture ESCHERICHIA COLI (Brysyn Brandenberger)  Final   Report Status 08/24/2017 FINAL  Final   Organism ID, Bacteria ESCHERICHIA COLI  Final      Susceptibility   Escherichia coli - MIC*    AMPICILLIN <=2 SENSITIVE Sensitive     CEFAZOLIN <=4 SENSITIVE Sensitive     CEFEPIME <=1 SENSITIVE Sensitive     CEFTAZIDIME <=1 SENSITIVE Sensitive     CEFTRIAXONE <=1 SENSITIVE Sensitive     CIPROFLOXACIN <=0.25 SENSITIVE Sensitive     GENTAMICIN <=1 SENSITIVE Sensitive     IMIPENEM <=0.25 SENSITIVE Sensitive     TRIMETH/SULFA <=20 SENSITIVE Sensitive     AMPICILLIN/SULBACTAM <=2 SENSITIVE Sensitive     PIP/TAZO <=4 SENSITIVE Sensitive     Extended ESBL NEGATIVE Sensitive     * ESCHERICHIA COLI  Blood Culture ID Panel (Reflexed)     Status: Abnormal   Collection Time: 08/21/17  1:23 PM  Result Value Ref Range Status   Enterococcus species NOT DETECTED NOT DETECTED Final   Vancomycin resistance NOT  DETECTED NOT DETECTED Final   Listeria monocytogenes NOT DETECTED NOT DETECTED Final   Staphylococcus species NOT DETECTED NOT DETECTED Final   Staphylococcus aureus NOT DETECTED NOT DETECTED Final   Methicillin resistance NOT DETECTED NOT DETECTED Final   Streptococcus species NOT DETECTED NOT DETECTED Final   Streptococcus agalactiae  NOT DETECTED NOT DETECTED Final   Streptococcus pneumoniae NOT DETECTED NOT DETECTED Final   Streptococcus pyogenes NOT DETECTED NOT DETECTED Final   Acinetobacter baumannii NOT DETECTED NOT DETECTED Final   Enterobacteriaceae species DETECTED (Astou Lada) NOT DETECTED Final    Comment: CRITICAL RESULT CALLED TO, READ BACK BY AND VERIFIED WITH: PHARMD J GADHIA [id number] MLM    Enterobacter cloacae complex NOT DETECTED NOT DETECTED Final   Escherichia coli DETECTED (Alee Gressman) NOT DETECTED Final    Comment: CRITICAL RESULT CALLED TO, READ BACK BY AND VERIFIED WITH: PHARMD J GADHIA [id number] MLM    Klebsiella oxytoca NOT DETECTED NOT DETECTED Final   Klebsiella pneumoniae NOT DETECTED NOT DETECTED Final   Proteus species NOT DETECTED NOT DETECTED Final   Serratia marcescens NOT DETECTED NOT DETECTED Final   Carbapenem resistance NOT DETECTED NOT DETECTED Final   Haemophilus influenzae NOT DETECTED NOT DETECTED Final   Neisseria meningitidis NOT DETECTED NOT DETECTED Final   Pseudomonas aeruginosa NOT DETECTED NOT DETECTED Final   Candida albicans NOT DETECTED NOT DETECTED Final   Candida glabrata NOT DETECTED NOT DETECTED Final   Candida krusei NOT DETECTED NOT DETECTED Final   Candida parapsilosis NOT DETECTED NOT DETECTED Final   Candida tropicalis NOT DETECTED NOT DETECTED Final    Comment: Performed at [facility], [address] [place], [place] [address]  Culture, blood (Routine x 2)     Status: Abnormal   Collection Time: 08/21/17  2:07 PM  Result Value Ref Range Status   Specimen Description BLOOD LEFT FOREARM  Final   Special Requests   Final    BOTTLES DRAWN AEROBIC AND ANAEROBIC Blood Culture adequate volume   Culture  Setup Time   Final    GRAM NEGATIVE RODS AEROBIC BOTTLE ONLY CRITICAL VALUE NOTED.  VALUE IS CONSISTENT WITH PREVIOUSLY REPORTED AND CALLED VALUE.    Culture (Georgetta Crafton)  Final    ESCHERICHIA COLI SUSCEPTIBILITIES PERFORMED ON PREVIOUS CULTURE WITHIN THE LAST 5  DAYS. Performed at [facility], [place] [name] [place], [place] [address]    Report Status 08/24/2017 FINAL  Final  Urine culture     Status: Abnormal   Collection Time: 08/21/17  2:50 PM  Result Value Ref Range Status   Specimen Description URINE, RANDOM  Final   Special Requests NONE  Final   Culture MULTIPLE SPECIES PRESENT, SUGGEST RECOLLECTION (Rindy Kollman)  Final   Report Status 08/22/2017 FINAL  Final  MRSA PCR Screening     Status: Abnormal   Collection Time: 08/21/17  4:25 PM  Result Value Ref Range Status   MRSA by PCR POSITIVE (Saroya Riccobono) NEGATIVE Final    Comment:        The GeneXpert MRSA Assay (FDA approved for NASAL specimens only), is one component of Aubryana Vittorio comprehensive MRSA colonization surveillance program. It is not intended to diagnose MRSA infection nor to guide or monitor treatment for MRSA infections. RESULT CALLED TO, READ BACK BY AND VERIFIED WITH: S.KOTIAN RN AT 1936 ON 08/21/17 BY S.VANHOORNE MLS   Gastrointestinal Panel by PCR , Stool     Status: None   Collection Time: 08/21/17  6:25 PM  Result  Value Ref Range Status   Campylobacter species NOT DETECTED NOT DETECTED Final   Plesimonas shigelloides NOT DETECTED NOT DETECTED Final   Salmonella species NOT DETECTED NOT DETECTED Final   Yersinia enterocolitica NOT DETECTED NOT DETECTED Final   Vibrio species NOT DETECTED NOT DETECTED Final   Vibrio cholerae NOT DETECTED NOT DETECTED Final   Enteroaggregative E coli (EAEC) NOT DETECTED NOT DETECTED Final   Enteropathogenic E coli (EPEC) NOT DETECTED NOT DETECTED Final   Enterotoxigenic E coli (ETEC) NOT DETECTED NOT DETECTED Final   Shiga like toxin producing E coli (STEC) NOT DETECTED NOT DETECTED Final   Shigella/Enteroinvasive E coli (EIEC) NOT DETECTED NOT DETECTED Final   Cryptosporidium NOT DETECTED NOT DETECTED Final   Cyclospora cayetanensis NOT DETECTED NOT DETECTED Final   Entamoeba histolytica NOT DETECTED NOT DETECTED Final   Giardia lamblia NOT  DETECTED NOT DETECTED Final   Adenovirus F40/41 NOT DETECTED NOT DETECTED Final   Astrovirus NOT DETECTED NOT DETECTED Final   Norovirus GI/GII NOT DETECTED NOT DETECTED Final   Rotavirus Price [place] NOT DETECTED NOT DETECTED Final   Sapovirus (I, II, IV, and V) NOT DETECTED NOT DETECTED Final  Urine culture     Status: None   Collection Time: 08/21/17 11:15 PM  Result Value Ref Range Status   Specimen Description URINE, SUPRAPUBIC RIGHT KIDNEY  Final   Special Requests Immunocompromised  Final   Culture   Final    NO GROWTH Performed at [facility], [address], [place], [place] [address]    Report Status 08/23/2017 FINAL  Final  Culture, blood (routine x 2)     Status: None (Preliminary result)   Collection Time: 08/25/17  4:43 PM  Result Value Ref Range Status   Specimen Description BLOOD PORTA CATH  Final   Special Requests   Final    BOTTLES DRAWN AEROBIC AND ANAEROBIC Blood Culture adequate volume   Culture PENDING  Incomplete   Report Status PENDING  Incomplete         Radiology Studies: No results found.      Scheduled Meds: . chlorhexidine  15 mL Mouth Rinse BID  . Chlorhexidine Gluconate Cloth  6 each Topical Daily  . feeding supplement (ENSURE ENLIVE)  237 mL Oral BID BM  . mouth rinse  15 mL Mouth Rinse q12n4p  . mupirocin ointment  1 application Nasal BID  . pantoprazole  40 mg Oral Daily  . potassium chloride  40 mEq Oral BID  . potassium chloride  40 mEq Oral BID  . sodium chloride flush  10-40 mL Intracatheter Q12H  . sodium chloride flush  5 mL Intravenous Q8H   Continuous Infusions: . calcium gluconate    . calcium gluconate    . cefTRIAXone (ROCEPHIN)  IV Stopped (08/27/17 1941)  . lactated ringers 75 mL/hr at 08/26/17 2342  . potassium PHOSPHATE IVPB (mmol)       LOS: 6 days    Time spent: over 30 min    Fayrene Helper, MD [facility] [phone]   If 7PM-7AM, please contact night-coverage [email or web address] Password  [facility] 08/27/2017, 12:18 PM

## 2017-08-27 NOTE — Care Management Note (Signed)
Case Management Note  Patient Details  Name: Cory Ibarra MRN: [id number] Date of Birth: 09-22-1961  Subjective/Objective:                  sepsis  Action/Plan: Date:  August 27, 2017 Chart reviewed for concurrent status and case management needs.  Will continue to follow patient progress.  Discharge Planning: following for needs  Expected discharge date: [id number]  Velva Harman, BSN, [place], [place]    Expected Discharge Date:   (unknown)               Expected Discharge Plan:  Home/Self Care  In-House Referral:     Discharge planning Services  CM Consult  Post Acute Care Choice:    Choice offered to:     DME Arranged:    DME Agency:     HH Arranged:    [place] Agency:     Status of Service:  In process, will continue to follow  If discussed at Long Length of Stay Meetings, dates discussed:    Additional Comments:  Leeroy Cha, RN 08/27/2017, 10:13 AM

## 2017-08-27 NOTE — Consult Note (Signed)
IP PROGRESS NOTE Requesting provider: Dr Doreatha Lew  Subjective:  Patient continued to improve over the weekend, no recurrent fever spikes. Transferred out of the intensive care unit today Gen. MedSurg floor. He is noted to have poor oral intake, reduced activity, and depressed mood. He does acknowledge some of the symptoms. He has poor appetite, and low energy. He also feels somewhat restricted in his ability to engage in physical activity due to being on isolation precautions.  Objective: Vital signs in last 24 hours: Blood pressure 140/76, pulse 89, temperature 98.4 F (36.9 C), temperature source Oral, resp. rate 18, height 6\' 2"  (1.88 m), weight 168 lb 14 oz (76.6 kg), SpO2 94 %.  Intake/Output from previous day: 09/23 0701 - 09/24 0700 In: 920 [P.O.:120] Out: 1900 [Urine:1750; Drains:150]  Physical Exam: Gen: Patient is alert, awake oriented 3, affect is flattened compared to his previous baseline. HEENT: Unremarkable Lungs: clear to auscultation anteriorly bilaterally. Cardiac: Regular, S1/S2. No murmurs Abdomen: Abdomen soft, nontender nondistended. Her distention has resolved. Percutaneous nephrostomy on the right side is in place draining clear but darkly colored urine. Extremities: No peripheral edema   Portacath/PICC-without erythema  Lab Results:  Recent Labs  08/26/17 0322 08/27/17 0348  WBC 20.0* 13.4*  HGB 8.7* 8.7*  HCT 25.6* 25.2*  PLT 66* 63*    BMET  Recent Labs  08/26/17 0322 08/27/17 0348  NA 144 140  K 2.9* 3.0*  CL 111 105  CO2 25 27  GLUCOSE 98 92  BUN 9 7  CREATININE 0.44* 0.31*  CALCIUM 6.5* 6.3*    Lab Results  Component Value Date   CEA1 0.7 06/28/2017    Studies/Results: No results found.  Medications: I have reviewed the patient's current medications.  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 Currently, right kidneys drained by percutaneous nephrostomy. Neutropenia has resolved. Patient remains afebrile. Presently, most prominent problems include physical deconditioning and poor oral intake with mixed calorie/protein malnutrition. All of the above is being addressed by the primary hospitalist service..  Recommendations: --Agree with discontinuation of Neupogen based on the current absolute neutrophil count --Agree with assessment of patient developing dysphoria/depression due to chronic medical condition and severity over the current hospitalization. I agree with the plans of considering Remeron for antidepressant effect, appetite stimulation, and improved sleep. --Recommend transfusion support as follows: Use irradiated products only, Hgb <=7.0 -- transfuse 1U pRBC and re-check Hgb, Plt <= 20 -- transfuse 1 pack Plt --Disposition: The discretion of the primary hospitalist service. Agree with the current plan to proceed with skilled nursing facility for physical rehabilitation on discharge. Currently, patient has a scheduled appointment with me on Thursday. I will keep that on the books unless the patient is still hospitalized on that day. He is currently scheduled round of chemotherapy will be postponed by at least 1 week to allow for improved physical and emotional recovery.   LOS: 6 days   Ardath Sax, MD   08/27/2017, 6:28 PM

## 2017-08-28 LAB — COMPREHENSIVE METABOLIC PANEL
ALBUMIN: 2.5 g/dL — AB (ref 3.5–5.0)
ALT: 57 U/L (ref 17–63)
ANION GAP: 9 (ref 5–15)
AST: 20 U/L (ref 15–41)
Alkaline Phosphatase: 122 U/L (ref 38–126)
BUN: 6 mg/dL (ref 6–20)
CO2: 25 mmol/L (ref 22–32)
Calcium: 6.4 mg/dL — CL (ref 8.9–10.3)
Chloride: 103 mmol/L (ref 101–111)
Creatinine, Ser: 0.33 mg/dL — ABNORMAL LOW (ref 0.61–1.24)
GFR calc Af Amer: >60 mL/min (ref >60)
GFR calc non Af Amer: >60 mL/min (ref >60)
GLUCOSE: 86 mg/dL (ref 65–99)
POTASSIUM: 3.8 mmol/L (ref 3.5–5.1)
SODIUM: 137 mmol/L (ref 135–145)
TOTAL PROTEIN: 5.2 g/dL — AB (ref 6.5–8.1)
Total Bilirubin: 1.4 mg/dL — ABNORMAL HIGH (ref 0.3–1.2)

## 2017-08-28 LAB — CBC WITH DIFFERENTIAL/PLATELET
BASOS ABS: 0.1 10*3/uL (ref 0.0–0.1)
Basophils Relative: 1 %
EOS PCT: 0 %
Eosinophils Absolute: 0 10*3/uL (ref 0.0–0.7)
HEMATOCRIT: 28.3 % — AB (ref 39.0–52.0)
Hemoglobin: 9.6 g/dL — ABNORMAL LOW (ref 13.0–17.0)
Lymphocytes Relative: 9 %
Lymphs Abs: 0.9 10*3/uL (ref 0.7–4.0)
MCH: 25.5 pg — ABNORMAL LOW (ref 26.0–34.0)
MCHC: 33.9 g/dL (ref 30.0–36.0)
MCV: 75.1 fL — ABNORMAL LOW (ref 78.0–100.0)
MONO ABS: 0.9 10*3/uL (ref 0.1–1.0)
Monocytes Relative: 9 %
NEUTROS PCT: 81 %
Neutro Abs: 8.2 10*3/uL — ABNORMAL HIGH (ref 1.7–7.7)
PLATELETS: 79 10*3/uL — AB (ref 150–400)
RBC: 3.77 MIL/uL — AB (ref 4.22–5.81)
RDW: 19.4 % — ABNORMAL HIGH (ref 11.5–15.5)
WBC: 10.1 10*3/uL (ref 4.0–10.5)

## 2017-08-28 LAB — MAGNESIUM: Magnesium: 1.7 mg/dL (ref 1.7–2.4)

## 2017-08-28 LAB — PHOSPHORUS: Phosphorus: 2 mg/dL — ABNORMAL LOW (ref 2.5–4.6)

## 2017-08-28 MED ORDER — SODIUM CHLORIDE 0.9 % IV SOLN
2.0000 g | Freq: Once | INTRAVENOUS | Status: AC
  Administered 2017-08-28: 2 g via INTRAVENOUS
  Filled 2017-08-28: qty 20

## 2017-08-28 MED ORDER — AMPICILLIN SODIUM 2 G IJ SOLR
2.0000 g | INTRAMUSCULAR | Status: DC
  Administered 2017-08-29 – 2017-08-31 (×15): 2 g via INTRAVENOUS
  Filled 2017-08-28 (×16): qty 2000

## 2017-08-28 MED ORDER — DRONABINOL 2.5 MG PO CAPS
5.0000 mg | ORAL_CAPSULE | Freq: Every day | ORAL | Status: DC
  Administered 2017-08-29 – 2017-08-31 (×3): 5 mg via ORAL
  Filled 2017-08-28 (×4): qty 2

## 2017-08-28 MED ORDER — MAGNESIUM SULFATE 2 GM/50ML IV SOLN
2.0000 g | Freq: Once | INTRAVENOUS | Status: AC
  Administered 2017-08-28: 2 g via INTRAVENOUS
  Filled 2017-08-28: qty 50

## 2017-08-28 MED ORDER — SODIUM CHLORIDE 0.9 % IV SOLN: 2.0000 g | Freq: Once | INTRAVENOUS | Status: DC

## 2017-08-28 MED ORDER — POTASSIUM PHOSPHATES 15 MMOLE/5ML IV SOLN
20.0000 mmol | Freq: Once | INTRAVENOUS | Status: AC
  Administered 2017-08-28: 20 mmol via INTRAVENOUS
  Filled 2017-08-28: qty 6.67

## 2017-08-28 MED ORDER — DEXTROSE-NACL 5-0.9 % IV SOLN
INTRAVENOUS | Status: DC
  Administered 2017-08-28: 1000 mL via INTRAVENOUS
  Administered 2017-08-29: 21:00:00 via INTRAVENOUS

## 2017-08-28 NOTE — Progress Notes (Signed)
PT Cancellation Note  Patient Details Name: Cory Ibarra MRN: [id number] DOB: [birth year]   Cancelled Treatment:     spent about 15 min with pt "just talking" about his current physical/medical condition and his goals to return home.  Pt did not want to actively participate "right now" and declined any OOB suggestion (recliner/toilet).  I asked if he was "giving up".  Pt stated "no" but admits to not taking his meds and not eating.  "I'm just not hungry" , "these meds they have me on makes me feel bad".  Pt verbally understood he needed to eat to nourish his body for Physical Therapy.  He repeated, "just not right now".  "I am mad".      I was unable to return in the afternoon so will attempt again tomorrow.   Rica Koyanagi  PTA [facility]  Acute  Rehab Pager      [phone]

## 2017-08-28 NOTE — Progress Notes (Signed)
PROGRESS NOTE    Cory Ibarra  JTT:[id number] DOB: 1961-07-15 DOA: 08/21/2017 PCP: [name], [name]    Brief Narrative: 56 yo male presented with fever, n/v, hematuria, diarrhea.  Found to have multiple renal calculi with Rt ureteral calculus.  Required pressors in setting of septic shock and PCCM asked to assess.  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  He was admitted for febrile neutropenia and septic shock secondary to an obstructing right ureteral stone. Right-sided retrograde ureteral stent placement was unsuccessful and IR placed a right-sided percutaneous nephric nephrostomy catheter. He's grown out Escherichia coli bacteremia which is pansensitive.  Cory Ibarra is currently hemodynamically stable, but has not participated actively in his care since I've been taking care of him. He has not been eating.  He has significant electrolyte abnormalities daily.  We discussed an NG tube today until he gets his appetite back which he declined.  Palliative care consult has been placed on 9/25.  Assessment & Plan:   Active Problems:   Neutropenia (HCC)   Gross hematuria   Lactic acidosis   Neutropenic sepsis ([place])   Septic shock due to Escherichia coli ([place])   Sepsis secondary to e. Coli bacteremia  Obstructed R ureteral Stone:   - Now s/p perc nephrostomy.  Pressors now d/c'd. - s/p perc nephrostomy tube (urology unable to place retrograde stend due to severe impaction)   - ampicillin 2 q 4 (9/25 - ) ... ceftriaxone 2 grams daily (9/19 - 9/25) (vanc 9/18-9/19; zosyn 9/18-9/19) - ucx multiple species, blood cx x2 e. Coli, pan sensitive  - weaned hydrocortisone - neupogen d/c'd - repeat bcx pending (NGTD x 2 day)  Pancytopenia  Neutropenia: Neutropenia improved s/p neupogen, d/c'd - Anemia, thrombocytopenia stable [ ]  Per onc, use irradiated products only and transfuse for Hb <7 and Plt <20  Diffuse Large B Cell  Lymphoma: dx in July 2018, last chemo 08/09/17 - chemo with [name]  Hypophosphatemia  Hypocalcemia  Hypomagnesemia  Hypokalemia:  - repleting IV/PO    Elevated alk phos and ALT: continue to monitor, have been elevated in past. - viral hep studies from 8/13 notable for + Hep B core Ab and surface antibody, but negative surface antigen and negative HBV PCR   Elevated troponin: on presentation, 2/2 demand ischemia  Diarrhea: improved, GI path panel on admission negative  Malnutrition:  Low albumin, refusing meals, significant electrolyte abnormalities.  Did not participate with discussion with dietician.  Encouraged him to take PO.  Again had long discussion about options.  Discussed placing temporary cortrak for nutrition, but he declines this.  Have added remeron and marinol, but he's been refusing medications and refused the first dose of remeron yesterday.  [ ]  calorie count ordered (per dietician note, eating 0-10% of meals)  Concern for depression:  He's refused to get out of bed and participate with nursing at times.  He's not eating his meals.  He denies depression, but I spoke to Dr. [name] who notes this is significantly different for him.  Encouraged participation and eating.  Discussed med to stimulate appetite/depression and he initially declined this, but was open after Dr. [name] spoke to him (but he did not take this last night).  I've considered a psychiatry consult, but I'm not sure he would participate with discussion.  I think palliative care may be a better place to start to discuss goals of care, but psychiatry may also be  helpful at some point.   DVT prophylaxis: SCD Code Status: Partial, no CPR Family Communication: none in room Disposition Plan: pending improvement, PT/OT eval   Consultants:   PCCM, Urology, IR, Oncology  Procedures: (Don't include imaging studies which can be auto populated. Include things that cannot be auto populated i.e. Echo, Carotid and venous  dopplers, Foley, Bipap, HD, tubes/drains, wound vac, central lines etc)  Perc nephrostomy tube placement  R cystoscopy with retrograde pyelogram, unsuccessful stent attempt  Antimicrobials: (specify start and planned stop date. Auto populated tables are space occupying and do not give end dates) Anti-infectives    Start     Dose/Rate Route Frequency Ordered Stop   08/22/17 1000  cefTRIAXone (ROCEPHIN) 2 g in dextrose 5 % 50 mL IVPB     2 g 100 mL/hr over 30 Minutes Intravenous Every 24 hours 08/22/17 0836     08/22/17 0000  vancomycin (VANCOCIN) IVPB 750 mg/150 ml premix  Status:  Discontinued     750 mg 150 mL/hr over 60 Minutes Intravenous Every 8 hours 08/21/17 1858 08/22/17 0836   08/21/17 2200  piperacillin-tazobactam (ZOSYN) IVPB 3.375 g  Status:  Discontinued     3.375 g 12.5 mL/hr over 240 Minutes Intravenous Every 8 hours 08/21/17 1858 08/22/17 0836   08/21/17 1530  vancomycin (VANCOCIN) 1,500 mg in sodium chloride 0.9 % 500 mL IVPB     1,500 mg 250 mL/hr over 120 Minutes Intravenous  Once 08/21/17 1525 08/21/17 1745   08/21/17 1500  piperacillin-tazobactam (ZOSYN) IVPB 3.375 g     3.375 g 100 mL/hr over 30 Minutes Intravenous  Once 08/21/17 1451 08/21/17 1550   08/21/17 1500  vancomycin (VANCOCIN) IVPB 1000 mg/200 mL premix  Status:  Discontinued     1,000 mg 200 mL/hr over 60 Minutes Intravenous  Once 08/21/17 1451 08/21/17 1525       Subjective: Becomes upset when discussing his appetite and nutrition supplementation.  Doesn't have any complaints.   Objective: Vitals:   08/27/17 1414 08/27/17 2100 08/28/17 0506 08/28/17 1021  BP: 140/76 121/81 131/69 134/71  Pulse: 89 94 98 84  Resp: 18 18 18  (!) 22  Temp: 98.4 F (36.9 C) 99 F (37.2 C) 98.6 F (37 C) 99.3 F (37.4 C)  TempSrc: Oral Oral Oral Oral  SpO2: 94% 95% 96% 96%  Weight:      Height:        Intake/Output Summary (Last 24 hours) at 08/28/17 1139 Last data filed at 08/28/17 0800  Gross per 24  hour  Intake          4586.25 ml  Output             4225 ml  Net           361.25 ml   Filed Weights   08/21/17 1216 08/21/17 1441 08/21/17 1643  Weight: 81.6 kg (180 lb) 81.6 kg (180 lb) 76.6 kg (168 lb 14 oz)    Examination:  General: No acute distress.  Lying flat in bed.   Cardiovascular: Heart sounds show a regular rate, and rhythm. No gallops or rubs. No murmurs. No JVD. Lungs: Clear to auscultation bilaterally with good air movement. No rales, rhonchi or wheezes. Abdomen: Soft, nontender, nondistended with normal active bowel sounds. No masses. No hepatosplenomegaly. Doesn't turn enough for me to see PCN tube.  Neurological: Alert and oriented 3. Moves all extremities 4. Cranial nerves II through XII grossly intact.  Skin: Warm and dry. No rashes or  lesions. Extremities: No clubbing or cyanosis. No edema.  Psychiatric: affect seems down/flat   Data Reviewed: I have personally reviewed following labs and imaging studies  CBC:  Recent Labs Lab 08/24/17 1207 08/25/17 1042 08/26/17 0322 08/27/17 0348 08/28/17 0525  WBC 5.3 9.1 20.0* 13.4* 10.1  NEUTROABS 4.5 8.4* 18.8* 11.6* 8.2*  HGB 8.7* 8.2* 8.7* 8.7* 9.6*  HCT 24.9* 23.6* 25.6* 25.2* 28.3*  MCV 73.7* 74.2* 74.9* 73.5* 75.1*  PLT 57* 54* 66* 63* 79*   Basic Metabolic Panel:  Recent Labs Lab 08/23/17 0434  08/24/17 1805 08/25/17 0315 08/25/17 1042 08/26/17 0322 08/27/17 0348 08/28/17 0525  NA 134*  < > 140 143  --  144 140 137  K 3.1*  < > 3.4* 3.6  --  2.9* 3.0* 3.8  CL 105  < > 108 110  --  111 105 103  CO2 20*  < > 23 23  --  25 27 25   GLUCOSE 102*  < > 97 97  --  98 92 86  BUN 18  < > 9 9  --  9 7 6   CREATININE 0.46*  < > 0.35* 0.41*  --  0.44* 0.31* 0.33*  CALCIUM 6.0*  < > 6.6* 6.7*  --  6.5* 6.3* 6.4*  MG 1.7  --   --  1.8  --  1.6* 1.9 1.7  PHOS  --   --   --  <1.0* <1.0* 1.7* 1.8* 2.0*  < > = values in this interval not displayed. GFR: Estimated Creatinine Clearance: 109 mL/min (A) (by  C-G formula based on SCr of 0.33 mg/dL (L)). Liver Function Tests:  Recent Labs Lab 08/21/17 1316 08/23/17 0448 08/26/17 0322 08/27/17 0348 08/28/17 0525  AST 30  --  28 20 20   ALT 41  --  107* 73* 57  ALKPHOS 112  --  138* 126 122  BILITOT 2.2*  --  1.3* 1.2 1.4*  PROT 5.9*  --  4.7* 4.6* 5.2*  ALBUMIN 3.1* 2.3* 2.3* 2.3* 2.5*   No results for input(s): LIPASE, AMYLASE in the last 168 hours. No results for input(s): AMMONIA in the last 168 hours. Coagulation Profile:  Recent Labs Lab 08/21/17 1316  INR 1.23   Cardiac Enzymes:  Recent Labs Lab 08/21/17 1316 08/21/17 2249 08/22/17 0332  CKTOTAL 65  --   --   TROPONINI  --  0.98* 0.88*   BNP (last 3 results) No results for input(s): PROBNP in the last 8760 hours. HbA1C: No results for input(s): HGBA1C in the last 72 hours. CBG:  Recent Labs Lab 08/21/17 2338 08/22/17 0312 08/22/17 0750 08/22/17 1203 08/22/17 1641  GLUCAP 105* 179* 120* 122* 110*   Lipid Profile: No results for input(s): CHOL, HDL, LDLCALC, TRIG, CHOLHDL, LDLDIRECT in the last 72 hours. Thyroid Function Tests: No results for input(s): TSH, T4TOTAL, FREET4, T3FREE, THYROIDAB in the last 72 hours. Anemia Panel: No results for input(s): VITAMINB12, FOLATE, FERRITIN, TIBC, IRON, RETICCTPCT in the last 72 hours. Sepsis Labs:  Recent Labs Lab 08/21/17 2249 08/22/17 0332 08/22/17 0414 08/22/17 0836 08/23/17 0434  PROCALCITON 67.99 69.28  --   --  33.06  LATICACIDVEN 3.6*  --  3.3* 3.2* 1.9    Recent Results (from the past 240 hour(s))  Culture, blood (Routine x 2)     Status: Abnormal   Collection Time: 08/21/17  1:23 PM  Result Value Ref Range Status   Specimen Description BLOOD RIGHT PORTA CATH  Final   Special  Requests   Final    BOTTLES DRAWN AEROBIC AND ANAEROBIC Blood Culture adequate volume   Culture  Setup Time   Final    GRAM NEGATIVE RODS IN BOTH AEROBIC AND ANAEROBIC BOTTLES CRITICAL RESULT CALLED TO, READ BACK BY AND  VERIFIED WITH: Damian Leavell [id number] MLM Performed at [facility], [place] [address] [place], [place] [address]    Culture ESCHERICHIA COLI (A)  Final   Report Status 08/24/2017 FINAL  Final   Organism ID, Bacteria ESCHERICHIA COLI  Final      Susceptibility   Escherichia coli - MIC*    AMPICILLIN <=2 SENSITIVE Sensitive     CEFAZOLIN <=4 SENSITIVE Sensitive     CEFEPIME <=1 SENSITIVE Sensitive     CEFTAZIDIME <=1 SENSITIVE Sensitive     CEFTRIAXONE <=1 SENSITIVE Sensitive     CIPROFLOXACIN <=0.25 SENSITIVE Sensitive     GENTAMICIN <=1 SENSITIVE Sensitive     IMIPENEM <=0.25 SENSITIVE Sensitive     TRIMETH/SULFA <=20 SENSITIVE Sensitive     AMPICILLIN/SULBACTAM <=2 SENSITIVE Sensitive     PIP/TAZO <=4 SENSITIVE Sensitive     Extended ESBL NEGATIVE Sensitive     * ESCHERICHIA COLI  Blood Culture ID Panel (Reflexed)     Status: Abnormal   Collection Time: 08/21/17  1:23 PM  Result Value Ref Range Status   Enterococcus species NOT DETECTED NOT DETECTED Final   Vancomycin resistance NOT DETECTED NOT DETECTED Final   Listeria monocytogenes NOT DETECTED NOT DETECTED Final   Staphylococcus species NOT DETECTED NOT DETECTED Final   Staphylococcus aureus NOT DETECTED NOT DETECTED Final   Methicillin resistance NOT DETECTED NOT DETECTED Final   Streptococcus species NOT DETECTED NOT DETECTED Final   Streptococcus agalactiae NOT DETECTED NOT DETECTED Final   Streptococcus pneumoniae NOT DETECTED NOT DETECTED Final   Streptococcus pyogenes NOT DETECTED NOT DETECTED Final   Acinetobacter baumannii NOT DETECTED NOT DETECTED Final   Enterobacteriaceae species DETECTED (A) NOT DETECTED Final    Comment: CRITICAL RESULT CALLED TO, READ BACK BY AND VERIFIED WITH: PHARMD J GADHIA [id number] MLM    Enterobacter cloacae complex NOT DETECTED NOT DETECTED Final   Escherichia coli DETECTED (A) NOT DETECTED Final    Comment: CRITICAL RESULT CALLED TO, READ BACK BY AND VERIFIED  WITH: PHARMD J GADHIA [id number] MLM    Klebsiella oxytoca NOT DETECTED NOT DETECTED Final   Klebsiella pneumoniae NOT DETECTED NOT DETECTED Final   Proteus species NOT DETECTED NOT DETECTED Final   Serratia marcescens NOT DETECTED NOT DETECTED Final   Carbapenem resistance NOT DETECTED NOT DETECTED Final   Haemophilus influenzae NOT DETECTED NOT DETECTED Final   Neisseria meningitidis NOT DETECTED NOT DETECTED Final   Pseudomonas aeruginosa NOT DETECTED NOT DETECTED Final   Candida albicans NOT DETECTED NOT DETECTED Final   Candida glabrata NOT DETECTED NOT DETECTED Final   Candida krusei NOT DETECTED NOT DETECTED Final   Candida parapsilosis NOT DETECTED NOT DETECTED Final   Candida tropicalis NOT DETECTED NOT DETECTED Final    Comment: Performed at [facility], [address] [place], [place] [address]  Culture, blood (Routine x 2)     Status: Abnormal   Collection Time: 08/21/17  2:07 PM  Result Value Ref Range Status   Specimen Description BLOOD LEFT FOREARM  Final   Special Requests   Final    BOTTLES DRAWN AEROBIC AND ANAEROBIC Blood Culture adequate volume   Culture  Setup Time   Final  GRAM NEGATIVE RODS AEROBIC BOTTLE ONLY CRITICAL VALUE NOTED.  VALUE IS CONSISTENT WITH PREVIOUSLY REPORTED AND CALLED VALUE.    Culture (A)  Final    ESCHERICHIA COLI SUSCEPTIBILITIES PERFORMED ON PREVIOUS CULTURE WITHIN THE LAST 5 DAYS. Performed at [facility], [place] [address], [place], [place] [address]    Report Status 08/24/2017 FINAL  Final  Urine culture     Status: Abnormal   Collection Time: 08/21/17  2:50 PM  Result Value Ref Range Status   Specimen Description URINE, RANDOM  Final   Special Requests NONE  Final   Culture MULTIPLE SPECIES PRESENT, SUGGEST RECOLLECTION (A)  Final   Report Status 08/22/2017 FINAL  Final  MRSA PCR Screening     Status: Abnormal   Collection Time: 08/21/17  4:25 PM  Result Value Ref Range Status   MRSA by PCR POSITIVE (A)  NEGATIVE Final    Comment:        The GeneXpert MRSA Assay (FDA approved for NASAL specimens only), is one component of a comprehensive MRSA colonization surveillance program. It is not intended to diagnose MRSA infection nor to guide or monitor treatment for MRSA infections. RESULT CALLED TO, READ BACK BY AND VERIFIED WITH: S.KOTIAN RN AT 1936 ON 08/21/17 BY S.VANHOORNE MLS   Gastrointestinal Panel by PCR , Stool     Status: None   Collection Time: 08/21/17  6:25 PM  Result Value Ref Range Status   Campylobacter species NOT DETECTED NOT DETECTED Final   Plesimonas shigelloides NOT DETECTED NOT DETECTED Final   Salmonella species NOT DETECTED NOT DETECTED Final   Yersinia enterocolitica NOT DETECTED NOT DETECTED Final   Vibrio species NOT DETECTED NOT DETECTED Final   Vibrio cholerae NOT DETECTED NOT DETECTED Final   Enteroaggregative E coli (EAEC) NOT DETECTED NOT DETECTED Final   Enteropathogenic E coli (EPEC) NOT DETECTED NOT DETECTED Final   Enterotoxigenic E coli (ETEC) NOT DETECTED NOT DETECTED Final   Shiga like toxin producing E coli (STEC) NOT DETECTED NOT DETECTED Final   Shigella/Enteroinvasive E coli (EIEC) NOT DETECTED NOT DETECTED Final   Cryptosporidium NOT DETECTED NOT DETECTED Final   Cyclospora cayetanensis NOT DETECTED NOT DETECTED Final   Entamoeba histolytica NOT DETECTED NOT DETECTED Final   Giardia lamblia NOT DETECTED NOT DETECTED Final   Adenovirus F40/41 NOT DETECTED NOT DETECTED Final   Astrovirus NOT DETECTED NOT DETECTED Final   Norovirus GI/GII NOT DETECTED NOT DETECTED Final   Rotavirus A NOT DETECTED NOT DETECTED Final   Sapovirus (I, II, IV, and V) NOT DETECTED NOT DETECTED Final  Urine culture     Status: None   Collection Time: 08/21/17 11:15 PM  Result Value Ref Range Status   Specimen Description URINE, SUPRAPUBIC RIGHT KIDNEY  Final   Special Requests Immunocompromised  Final   Culture   Final    NO GROWTH Performed at [facility], [address] [place], [place] [address]    Report Status 08/23/2017 FINAL  Final  Culture, blood (routine x 2)     Status: None (Preliminary result)   Collection Time: 08/25/17  4:43 PM  Result Value Ref Range Status   Specimen Description BLOOD PORTA CATH  Final   Special Requests   Final    BOTTLES DRAWN AEROBIC AND ANAEROBIC Blood Culture adequate volume   Culture   Final    NO GROWTH 1 DAY Performed at [facility], [place] [address], [place], [place] [address]  Report Status PENDING  Incomplete  Culture, blood (routine x 2)     Status: None (Preliminary result)   Collection Time: 08/25/17  4:44 PM  Result Value Ref Range Status   Specimen Description BLOOD BLOOD LEFT HAND  Final   Special Requests   Final    BOTTLES DRAWN AEROBIC AND ANAEROBIC Blood Culture results may not be optimal due to an excessive volume of blood received in culture bottles   Culture   Final    NO GROWTH 1 DAY Performed at [facility], [place] [address] [place], [place] [address]    Report Status PENDING  Incomplete         Radiology Studies: No results found.      Scheduled Meds: . chlorhexidine  15 mL Mouth Rinse BID  . Chlorhexidine Gluconate Cloth  6 each Topical Daily  . feeding supplement (ENSURE ENLIVE)  237 mL Oral BID BM  . mouth rinse  15 mL Mouth Rinse q12n4p  . mirtazapine  7.5 mg Oral QHS  . pantoprazole  40 mg Oral Daily  . potassium chloride  40 mEq Oral BID  . sodium chloride flush  10-40 mL Intracatheter Q12H  . sodium chloride flush  5 mL Intravenous Q8H  . thiamine injection  100 mg Intravenous Daily   Continuous Infusions: . calcium gluconate    . cefTRIAXone (ROCEPHIN)  IV 2 g (08/28/17 1033)  . lactated ringers 75 mL/hr at 08/28/17 0545  . magnesium sulfate 1 - 4 g bolus IVPB    . potassium PHOSPHATE IVPB (mmol)       LOS: 7 days    Time spent: over 30 min    Fayrene Helper, MD [facility] [phone]   If 7PM-7AM,  please contact night-coverage [email or web address] Password [facility] 08/28/2017, 11:39 AM

## 2017-08-28 NOTE — NC FL2 (Signed)
[place] LEVEL OF CARE SCREENING TOOL     IDENTIFICATION  Patient Name: Cory Ibarra Birthdate: 1961/03/30 Sex: male Admission Date (Current Location): 08/21/2017  [facility] and [place] Number:  Herbalist and Address:  [facility],  [place], [place]      Provider Number: [id number]  Attending Physician Name and Address:  Elodia Florence., *  Relative Name and Phone Number:       Current Level of Care: Hospital Recommended Level of Care: [place] Prior Approval Number:    Date Approved/Denied:   PASRR Number:    Discharge Plan: SNF    Current Diagnoses: Patient Active Problem List   Diagnosis Date Noted  . Septic shock due to Escherichia coli ([place])   . Neutropenia ([place]) 08/21/2017  . Gross hematuria   . Lactic acidosis   . Neutropenic sepsis ([place])   . Hypophosphatemia 08/20/2017  . 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 Chest wall mass   . 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 Closed compression fracture of L3 lumbar vertebra ([place]) 06/28/2017  . Severe back pain 06/28/2017  . Weight loss, unintentional 06/28/2017    Orientation RESPIRATION BLADDER Height & Weight     Self, Situation, Place, Time  Normal Continent, External catheter Weight: 168 lb 14 oz (76.6 kg) Height:  6\' 2"  (188 cm)  BEHAVIORAL SYMPTOMS/MOOD NEUROLOGICAL BOWEL NUTRITION STATUS   (No Behaviors)   Continent Diet (See D/C Summary )  AMBULATORY STATUS COMMUNICATION OF NEEDS Skin   Extensive Assist Verbally Normal                       Personal Care Assistance Level of Assistance  Bathing, Feeding, Dressing Bathing Assistance: Independent Feeding assistance: Independent Dressing Assistance: Independent     Functional Limitations Info  Sight, Hearing, Speech Sight Info: Adequate Hearing Info: Adequate Speech Info: Adequate    SPECIAL CARE FACTORS FREQUENCY  PT  (By licensed PT), OT (By licensed OT)     PT Frequency: 5x/week  OT Frequency: 5x/week            Contractures Contractures Info: Not present    Additional Factors Info  Code Status, Allergies Code Status Info: Partial  Allergies Info: Bee Venom           Current Medications (08/28/2017):  This is the current hospital active medication list Current Facility-Administered Medications  Medication Dose Route Frequency Provider Last Rate Last Dose  . acetaminophen (TYLENOL) tablet 650 mg  650 mg Oral Q6H PRN Patrecia Pour, Christean Grief, MD       Or  . acetaminophen (TYLENOL) suppository 650 mg  650 mg Rectal Q6H PRN Patrecia Pour, Christean Grief, MD      . calcium gluconate 2 g in sodium chloride 0.9 % 100 mL IVPB  2 g Intravenous Once Blount, Scarlette Shorts T, NP      . cefTRIAXone (ROCEPHIN) 2 g in dextrose 5 % 50 mL IVPB  2 g Intravenous Q24H Chesley Mires, MD   Stopped at 08/28/17 1204  . chlorhexidine (PERIDEX) 0.12 % solution 15 mL  15 mL Mouth Rinse BID Chesley Mires, MD   15 mL at 08/28/17 1017  . Chlorhexidine Gluconate Cloth 2 % PADS 6 each  6 each Topical Daily Chesley Mires, MD   6 each at 08/28/17 0600  . dextrose 5 %-0.9 % sodium chloride infusion   Intravenous Continuous Florene Glen,  A Clint Lipps., MD 75 mL/hr at 08/28/17 1441 1,000 mL at 08/28/17 1441  . [START ON 08/29/2017] dronabinol (MARINOL) capsule 5 mg  5 mg Oral QAC lunch Elodia Florence., MD      . feeding supplement (ENSURE ENLIVE) (ENSURE ENLIVE) liquid 237 mL  237 mL Oral BID BM Elodia Florence., MD      . fentaNYL (SUBLIMAZE) injection 25-50 mcg  25-50 mcg Intravenous Q2H PRN Rigoberto Noel, MD   50 mcg at 08/22/17 0917  . lactated ringers infusion   Intravenous Continuous Chesley Mires, MD 75 mL/hr at 08/28/17 0545    . MEDLINE mouth rinse  15 mL Mouth Rinse q12n4p Chesley Mires, MD   15 mL at 08/26/17 1650  . mirtazapine (REMERON) tablet 7.5 mg  7.5 mg Oral QHS Elodia Florence., MD      . ondansetron [facility]) injection 4  mg  4 mg Intravenous Q6H PRN Omar Person, NP      . oxyCODONE (Oxy IR/ROXICODONE) immediate release tablet 5 mg  5 mg Oral Q4H PRN Patrecia Pour, Christean Grief, MD   5 mg at 08/27/17 1949  . pantoprazole (PROTONIX) EC tablet 40 mg  40 mg Oral Daily Chesley Mires, MD   40 mg at 08/28/17 1019  . potassium chloride SA (K-DUR,KLOR-CON) CR tablet 40 mEq  40 mEq Oral BID Elodia Florence., MD   40 mEq at 08/26/17 1650  . potassium PHOSPHATE 20 mmol in dextrose 5 % 500 mL infusion  20 mmol Intravenous Once Elodia Florence., MD 84 mL/hr at 08/28/17 1454 20 mmol at 08/28/17 1454  . sodium chloride flush (NS) 0.9 % injection 10-40 mL  10-40 mL Intracatheter Q12H Chesley Mires, MD   10 mL at 08/28/17 1019  . sodium chloride flush (NS) 0.9 % injection 10-40 mL  10-40 mL Intracatheter PRN Chesley Mires, MD      . sodium chloride flush (NS) 0.9 % injection 5 mL  5 mL Intravenous Q8H Sandi Mariscal, MD   5 mL at 08/27/17 2200  . thiamine (B-1) injection 100 mg  100 mg Intravenous Daily Elodia Florence., MD   100 mg at 08/28/17 1017   Facility-Administered Medications Ordered in Other Encounters  Medication Dose Route Frequency Provider Last Rate Last Dose  . sodium chloride flush (NS) 0.9 % injection 10 mL  10 mL Intracatheter PRN Ardath Sax, MD   10 mL at 08/09/17 1702     Discharge Medications: Please see discharge summary for a list of discharge medications.  Relevant Imaging Results:  Relevant Lab Results:   Additional Information VHQ:[id number]  Lia Hopping, LCSW

## 2017-08-28 NOTE — Progress Notes (Signed)
   08/28/17 1400  Clinical Encounter Type  Visited With Patient not available;Health care provider  Visit Type Initial;Psychological support;Spiritual support  Referral From Nurse  Consult/Referral To Chaplain  Spiritual Encounters  Spiritual Needs Emotional;Other (Comment) (Spiritual Conversation/Support)  Stress Factors  Patient Stress Factors None identified   I visited with the patient per referral from the nurse.  The patient was asleep upon my arrival and refused to wake up for the nurse. He requested that a Chaplain come by tonight. I notified our night Chaplain and requested a possible visit.   Please, contact Spiritual Care for further assistance.   [place] M.Div.

## 2017-08-28 NOTE — Progress Notes (Signed)
Nutrition Brief Note  48 hour Calorie count initiated for patient 08/28/17 ending on 08/30/17.   Body mass index is 21.68 kg/m. Patient meets criteria for normal weight for height based on current BMI.   Current diet order is regular, patient is consuming approximately 0-10% of meals at this time.   RD to follow-up, results are to follow.   [place] Ranger, MS, RDN, LDN 08/28/2017 12:34 PM

## 2017-08-28 NOTE — Progress Notes (Signed)
CRITICAL VALUE ALERT  Critical Value:  Ca 6.4  Date & Time Notied:  08/28/17; 9692  Provider Notified: Schorr, NP  Orders Received/Actions taken:

## 2017-08-28 NOTE — Consult Note (Signed)
IP PROGRESS NOTE Requesting provider: Dr Doreatha Lew  Subjective:  Patient reports feeling physically reasonably well, but continues to refuse food based on the nursing staff report. When asked, denies abdominal pain, nausea, vomiting, constipation. Last bowel movement 2 days ago. Denies any abdominal pain or indigestion, just complete lack of appetite. Of note, patient was supposed to start Remeron last night, but it does not appear that the medication was given to him.  Objective: Vital signs in last 24 hours: Blood pressure 134/71, pulse 84, temperature 99.3 F (37.4 C), temperature source Oral, resp. rate (!) 22, height 6\' 2"  (1.88 m), weight 168 lb 14 oz (76.6 kg), SpO2 96 %.  Intake/Output from previous day: 09/24 0701 - 09/25 0700 In: 4586.3 [P.O.:120; I.V.:3656.3; IV Piggyback:810] Out: 7371 [Urine:4425]  Physical Exam: Gen: Patient is alert, awake oriented 3, affect is flattened compared to his previous baseline. HEENT: Unremarkable Lungs: clear to auscultation anteriorly bilaterally. Cardiac: Regular, S1/S2. No murmurs Abdomen: Abdomen soft, nontender nondistended. Her distention has resolved. Percutaneous nephrostomy on the right side is in place draining clear but darkly colored urine. Extremities: No peripheral edema   Portacath/PICC-without erythema  Lab Results:  Recent Labs  08/27/17 0348 08/28/17 0525  WBC 13.4* 10.1  HGB 8.7* 9.6*  HCT 25.2* 28.3*  PLT 63* 79*    BMET  Recent Labs  08/27/17 0348 08/28/17 0525  NA 140 137  K 3.0* 3.8  CL 105 103  CO2 27 25  GLUCOSE 92 86  BUN 7 6  CREATININE 0.31* 0.33*  CALCIUM 6.3* 6.4*    Lab Results  Component Value Date   CEA1 0.7 06/28/2017    Studies/Results: No results found.  Medications: I have reviewed the patient's current medications.  Assessment/Plan: 56 year old male with diffuse large cell lymphoma undergoing systemic chemotherapy with [name], presenting with neutropenic fever  with progression to septic shock likely precipitated by infection above obstructed right ureter. Currently, right kidneys drained by percutaneous nephrostomy. Neutropenia has resolved. Patient remains afebrile. Presently, most prominent problems include physical deconditioning and poor oral intake with mixed calorie/protein malnutrition. All of the above is being addressed by the primary hospitalist service. Agree that signs point at significant amount of depression leading to lack of appetite compounding loss of appetite from chemotherapy.  Recommendations: --Agree with assessment of patient developing dysphoria/depression due to chronic medical condition and severity over the current hospitalization. I agree with the plans of considering Remeron for antidepressant effect, appetite stimulation, and improved sleep. --Recommend transfusion support as follows: Use irradiated products only, Hgb <=7.0 -- transfuse 1U pRBC and re-check Hgb, Plt <= 20 -- transfuse 1 pack Plt --Disposition: The discretion of the primary hospitalist service. Agree with the current plan to proceed with skilled nursing facility for physical rehabilitation on discharge. Currently, patient has a scheduled appointment with me on Thursday. I will keep that on the books unless the patient is still hospitalized on that day. He is currently scheduled round of chemotherapy will be postponed by at least 1 week to allow for improved physical and emotional recovery.   LOS: 7 days   Ardath Sax, MD   08/28/2017, 1:41 PM

## 2017-08-28 NOTE — Clinical Social Work Note (Signed)
Clinical Social Work Assessment  Patient Details  Name: Cory Ibarra MRN: [id number] Date of Birth: December 25, 1960  Date of referral:  08/28/17               Reason for consult:  Facility Placement                Permission sought to share information with:  Psychiatrist, Customer service manager Permission granted to share information::  Yes, Verbal Permission Granted  Name::     Jaquane Boughner  Agency::  SNF  Relationship::  Brother   Contact Information:  [phone]  Housing/Transportation Living arrangements for the past 2 months:  Single Family Home Source of Information:  Patient Patient Interpreter Needed:  None Criminal Activity/Legal Involvement Pertinent to Current Situation/Hospitalization:  No - Comment as needed Significant Relationships:  Adult Children Lives with:   Brother  Do you feel safe going back to the place where you live?   Yes Need for family participation in patient care:  Yes   Care giving concerns:  Medical hx. Significant of extensive diffuse large B cell lymphoma ongoing Chemotherapy last treatment approximately 2 weeks ago.  PT recommends Rehab.    Social Worker assessment / plan:  CSW met with patient at bedside, explain role and reason for visit. Patient is agreeable to SNF placement. CSW explain SNF process and later providing bed offers. The patient gave CSW permission to fax out information and permission to talk with his Brother Gershon Mussel.  Patient reports prior to admission he was living at home with his brother.   CSW visited patient for second time to explain SNF medicaid bed process/ and required 30 day stay. Patient sleeping.   Plan: Assist w/ discharge to SNF   Employment status:  Disabled (Comment on whether or not currently receiving Disability) Insurance information:  [place]icaid [place] PT Recommendations:  [place], 24 Hour Supervision Information / Referral to community resources:  [place]  Patient/Family's Response to care:  Allowed CSW to inquire about placement.  Patient/Family's Understanding of and Emotional Response to Diagnosis, Current Treatment, and Prognosis: No family at bedside, the patient did not want to converse much during assessment.   Emotional Assessment Appearance:  Appears stated age Attitude/Demeanor/Rapport:   flat affect  Affect (typically observed):  Calm Orientation:  Oriented to Self, Oriented to Place, Oriented to  Time, Oriented to Situation Alcohol / Substance use:  Not Applicable Psych involvement (Current and /or in the community):  No   Discharge Needs  Concerns to be addressed:  Discharge Planning Concerns, Care Coordination Readmission within the last 30 days:  Yes Current discharge risk:  Dependent with Mobility Barriers to Discharge:  Continued Medical Work up   [employer], LCSW 08/28/2017, 12:55 PM

## 2017-08-29 DIAGNOSIS — Z7189 Other specified counseling: Secondary | ICD-10-CM

## 2017-08-29 DIAGNOSIS — Z515 Encounter for palliative care: Secondary | ICD-10-CM

## 2017-08-29 DIAGNOSIS — Z5111 Encounter for antineoplastic chemotherapy: Secondary | ICD-10-CM

## 2017-08-29 DIAGNOSIS — F4323 Adjustment disorder with mixed anxiety and depressed mood: Secondary | ICD-10-CM

## 2017-08-29 LAB — CBC WITH DIFFERENTIAL/PLATELET
BASOS ABS: 0 10*3/uL (ref 0.0–0.1)
Basophils Relative: 0 %
EOS PCT: 0 %
Eosinophils Absolute: 0 10*3/uL (ref 0.0–0.7)
HEMATOCRIT: 27.4 % — AB (ref 39.0–52.0)
Hemoglobin: 9.4 g/dL — ABNORMAL LOW (ref 13.0–17.0)
LYMPHS PCT: 13 %
Lymphs Abs: 1.1 10*3/uL (ref 0.7–4.0)
MCH: 25.5 pg — ABNORMAL LOW (ref 26.0–34.0)
MCHC: 34.3 g/dL (ref 30.0–36.0)
MCV: 74.5 fL — AB (ref 78.0–100.0)
MONOS PCT: 8 %
Monocytes Absolute: 0.7 10*3/uL (ref 0.1–1.0)
NEUTROS PCT: 79 %
Neutro Abs: 6.4 10*3/uL (ref 1.7–7.7)
PLATELETS: 85 10*3/uL — AB (ref 150–400)
RBC: 3.68 MIL/uL — AB (ref 4.22–5.81)
RDW: 19.2 % — ABNORMAL HIGH (ref 11.5–15.5)
WBC: 8.2 10*3/uL (ref 4.0–10.5)

## 2017-08-29 LAB — PHOSPHORUS: PHOSPHORUS: 1.8 mg/dL — AB (ref 2.5–4.6)

## 2017-08-29 LAB — COMPREHENSIVE METABOLIC PANEL
ALK PHOS: 114 U/L (ref 38–126)
ALT: 43 U/L (ref 17–63)
ANION GAP: 8 (ref 5–15)
AST: 17 U/L (ref 15–41)
Albumin: 2.5 g/dL — ABNORMAL LOW (ref 3.5–5.0)
BILIRUBIN TOTAL: 0.9 mg/dL (ref 0.3–1.2)
BUN: <5 mg/dL — ABNORMAL LOW (ref 6–20)
CALCIUM: 6.3 mg/dL — AB (ref 8.9–10.3)
CO2: 24 mmol/L (ref 22–32)
CREATININE: 0.3 mg/dL — AB (ref 0.61–1.24)
Chloride: 102 mmol/L (ref 101–111)
GFR calc non Af Amer: >60 mL/min (ref >60)
Glucose, Bld: 108 mg/dL — ABNORMAL HIGH (ref 65–99)
Potassium: 3.1 mmol/L — ABNORMAL LOW (ref 3.5–5.1)
SODIUM: 134 mmol/L — AB (ref 135–145)
TOTAL PROTEIN: 5.2 g/dL — AB (ref 6.5–8.1)

## 2017-08-29 LAB — HEPATITIS B SURFACE ANTIGEN: Hepatitis B Surface Ag: NEGATIVE

## 2017-08-29 LAB — MAGNESIUM: Magnesium: 1.7 mg/dL (ref 1.7–2.4)

## 2017-08-29 MED ORDER — VITAMIN B-1 100 MG PO TABS
100.0000 mg | ORAL_TABLET | Freq: Every day | ORAL | Status: DC
  Administered 2017-08-30: 100 mg via ORAL
  Filled 2017-08-29 (×5): qty 1

## 2017-08-29 MED ORDER — POTASSIUM CHLORIDE 10 MEQ/100ML IV SOLN: 10.0000 meq | INTRAVENOUS | Status: DC

## 2017-08-29 MED ORDER — THIAMINE HCL 100 MG/ML IJ SOLN
100.0000 mg | Freq: Every day | INTRAMUSCULAR | Status: DC
  Administered 2017-08-29 – 2017-08-31 (×2): 100 mg via INTRAVENOUS
  Filled 2017-08-29 (×2): qty 2

## 2017-08-29 MED ORDER — MAGNESIUM SULFATE 2 GM/50ML IV SOLN
2.0000 g | Freq: Once | INTRAVENOUS | Status: AC
  Administered 2017-08-29: 2 g via INTRAVENOUS
  Filled 2017-08-29: qty 50

## 2017-08-29 MED ORDER — SODIUM CHLORIDE 0.9 % IV SOLN
2.0000 g | Freq: Once | INTRAVENOUS | Status: AC
  Administered 2017-08-29: 2 g via INTRAVENOUS
  Filled 2017-08-29: qty 20

## 2017-08-29 MED ORDER — POTASSIUM CHLORIDE 10 MEQ/100ML IV SOLN
10.0000 meq | INTRAVENOUS | Status: DC
  Administered 2017-08-29 (×2): 10 meq via INTRAVENOUS
  Filled 2017-08-29 (×4): qty 100

## 2017-08-29 MED ORDER — POTASSIUM PHOSPHATES 15 MMOLE/5ML IV SOLN
30.0000 mmol | Freq: Once | INTRAVENOUS | Status: AC
  Administered 2017-08-29: 30 mmol via INTRAVENOUS
  Filled 2017-08-29: qty 10

## 2017-08-29 MED ORDER — SODIUM CHLORIDE 0.9 % IV BOLUS (SEPSIS)
500.0000 mL | Freq: Once | INTRAVENOUS | Status: AC
  Administered 2017-08-29: 500 mL via INTRAVENOUS

## 2017-08-29 MED ORDER — OLANZAPINE 5 MG PO TBDP
2.5000 mg | ORAL_TABLET | Freq: Every day | ORAL | Status: DC
  Administered 2017-08-29 – 2017-09-02 (×3): 2.5 mg via ORAL
  Filled 2017-08-29 (×5): qty 0.5

## 2017-08-29 NOTE — Progress Notes (Signed)
MD made aware of orthostatic BP / HR. Patient comfortable in chair at this time.

## 2017-08-29 NOTE — Consult Note (Signed)
Consultation Note Date: 08/29/2017   Patient Name: Cory Ibarra  DOB: 04-Jan-1961  MRN: [id number]  Age / Sex: 56 y.o., male  PCP: [name], [name] Referring Physician: Theodis Blaze, MD  Reason for Consultation: Establishing goals of care  HPI/Patient Profile: 56 y.o. male   admitted on 08/21/2017    Clinical Assessment and Goals of Care:  56 year old gentleman, lives at home with his brother, past medical history significant for diffuse large cell lymphoma, underwent systemic chemotherapy, patient has been admitted with neutropenic fever, septic shock, deemed to secondary to infection with obstructed right ureter, patient underwent nephrostomy tube placement, transferred out of stepdown unit after neutropenia resolved, palliative consultation for appropriate medication management and symptom management.  Patient  is resting in bed. He avoids eye contact. He answers in monosyllables especially yes/no type questions. He does not engage much. There is no family present at the bedside. I introduce myself and palliative care as follows:  Palliative medicine is specialized medical care for people living with serious illness. It focuses on providing relief from the symptoms and stress of a serious illness. The goal is to improve quality of life for both the patient and the family.  Goals wishes and values attempted to be discussed. Upon continuous encouragement to respond, patient states that he does remain hopeful for stabilization/recovery. He states his biggest worry is whether or not he will get better from his cancer. He simply does not have any appetite. He is not sure if he is sleeping well.  See recommendations below. Thank you for the consult.  HCPOA  brother Cory Ibarra.   SUMMARY OF RECOMMENDATIONS   1. D/C Remeron, recommend Zyprexa QHS.  2. Recommend psychiatry consult for additional input,  ?adjustment disorder with depressed mood.  3. Partial code 4. Patient elects his brother Cory Ibarra as his designated Psychiatrist, he states he has already completed paperwork reflecting the same.   Code Status/Advance Care Planning:  Limited code    Symptom Management:    as above   Palliative Prophylaxis:   Bowel Regimen   Psycho-social/Spiritual:   Desire for further Chaplaincy support:yes     Prognosis:   Unable to determine  Discharge Planning: To Be Determined      Primary Diagnoses: Present on Admission: . Neutropenia ([place])   I have reviewed the medical record, interviewed the patient and family, and examined the patient. The following aspects are pertinent.  Past Medical History:  Diagnosis Date  . Cancer ([place])   . Tendonitis    Social History   Social History  . Marital status: Single    Spouse name: N/A  . Number of children: 0  . Years of education: N/A   Social History Main Topics  . Smoking status: Never Smoker  . Smokeless tobacco: Never Used  . Alcohol use Yes     Comment: occasionally/socially  . Drug use: No  . Sexual activity: No   Other Topics Concern  . 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 Never used smokeless tobacco.   No e-cigarettes   Occasional alcohol consumption socially.   Single, no children.   Family History  Problem Relation Age of Onset  . Cancer Father    Scheduled Meds: . chlorhexidine  15 mL Mouth Rinse BID  . Chlorhexidine Gluconate Cloth  6 each Topical Daily  . dronabinol  5 mg Oral QAC lunch  . feeding supplement (ENSURE ENLIVE)  237 mL Oral BID BM  . mouth rinse  15 mL Mouth Rinse q12n4p  . OLANZapine zydis  2.5 mg Oral QHS  . pantoprazole  40 mg Oral Daily  . potassium chloride  40 mEq Oral BID  . sodium chloride flush  10-40 mL Intracatheter Q12H  . sodium chloride flush  5 mL Intravenous Q8H  . thiamine  100 mg Oral Daily    Or  . thiamine injection  100 mg Intravenous Daily   Continuous Infusions: . ampicillin (OMNIPEN) IV Stopped (08/29/17 1209)  . dextrose 5 % and 0.9% NaCl 1,000 mL (08/28/17 1441)  . lactated ringers 75 mL/hr at 08/28/17 0545  . potassium PHOSPHATE IVPB (mmol)     PRN Meds:.acetaminophen **OR** acetaminophen, fentaNYL (SUBLIMAZE) injection, ondansetron (ZOFRAN) IV, oxyCODONE, sodium chloride flush Medications Prior to Admission:  Prior to Admission medications   Medication Sig Start Date End Date Taking? Authorizing Provider  allopurinol (ZYLOPRIM) 300 MG tablet Take 1 tablet (300 mg total) by mouth daily. 07/16/17 08/21/17 Yes Perlov, Marinell Blight, MD  dexamethasone (DECADRON) 4 MG tablet Take 4 mg by mouth every other day.    Yes [provider]  lidocaine-prilocaine (EMLA) cream Apply to affected area once 07/24/17  Yes Perlov, Marinell Blight, MD  LORazepam (ATIVAN) 0.5 MG tablet Take 1 tablet (0.5 mg total) by mouth every 6 (six) hours as needed (Nausea or vomiting). 07/24/17  Yes Perlov, Marinell Blight, MD  oxyCODONE (OXY IR/ROXICODONE) 5 MG immediate release tablet Take 1 tablet (5 mg total) by mouth every 4 (four) hours as needed for moderate pain or severe pain. 08/13/17 09/03/17 Yes Hayden Pedro, PA-C  pantoprazole (PROTONIX) 40 MG tablet Take 1 tablet (40 mg total) by mouth daily at 12 noon. 08/13/17  Yes Hayden Pedro, PA-C  polyethylene glycol [facility] Floria Raveling) packet Take 17 g by mouth daily as needed for mild constipation. 07/03/17  Yes Domenic Polite, MD  ondansetron (ZOFRAN) 8 MG tablet Take 1 tablet (8 mg total) by mouth 2 (two) times daily as needed for refractory nausea / vomiting. Start on day 3 after cyclophosphamide chemotherapy. Patient not taking: Reported on 08/21/2017 07/24/17   Ardath Sax, MD   Allergies  Allergen Reactions  . Bee Venom Swelling   Review of Systems Avoids eye contact Weakness  Physical Exam Awake alert, flat affect, avoids  eye contact S1-S2 Clear to auscultation Has nephrostomy tube on right side Trace peripheral edema Awake alert, flat affect  Vital Signs: BP (!) 76/50   Pulse (!) 128   Temp 98.6 F (37 C) (Oral)   Resp 18   Ht 6\' 2"  (1.88 m)   Wt 76.6 kg (168 lb 14 oz)   SpO2 98%   BMI 21.68 kg/m  Pain Assessment: No/denies pain POSS *See Group Information*: 1-Acceptable,Awake and alert Pain Score: 0-No pain   SpO2: SpO2: 98 % O2 Device:SpO2: 98 % O2 Flow Rate: .O2 Flow Rate (L/min): 3 L/min  IO: Intake/output summary:  Intake/Output Summary (Last 24  hours) at 08/29/17 1240 Last data filed at 08/29/17 9643  Gross per 24 hour  Intake          2755.42 ml  Output             4100 ml  Net         -1344.58 ml   pps 30% LBM: Last BM Date: 08/28/17 Baseline Weight: Weight: 81.6 kg (180 lb) Most recent weight: Weight: 76.6 kg (168 lb 14 oz)     Palliative Assessment/Data:     Time In:  8 Time Out:9   Time Total: 60  Greater than 50%  of this time was spent counseling and coordinating care related to the above assessment and plan.  Signed by: Loistine Chance, MD  [phone] Please contact Palliative Medicine Team phone at [phone] for questions and concerns.  For individual provider: See Shea Evans

## 2017-08-29 NOTE — Progress Notes (Signed)
Calorie Count Note  48 hour calorie count ordered.  Diet: Regular Supplements: Ensure Enlive BID  Day 1: 08/29/17 Pt consumed 0% of every meal and supplement.   Nutrition Dx: Unintentional weight loss related to cancer and cancer related treatments, catabolic illness as evidenced by 17% weight loss in 2 months.  Goal: Patient will meet greater than or equal to 90% of their needs  Intervention: Ensure Enlive po BID, each supplement provides 350 kcal and 20 grams of protein  Mariana Single RD, LDN Clinical Nutrition Pager # [phone]

## 2017-08-29 NOTE — Progress Notes (Signed)
POA paperwork placed in chart.

## 2017-08-29 NOTE — Progress Notes (Signed)
Brother Cory Ibarra visited patient this morning. 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 He also states that he is to be POA but patient has not signed paper. RN informed that we can help to complete this process [name] is to bring paperwork back to complete today. Chaplain consult ordered.

## 2017-08-29 NOTE — Progress Notes (Signed)
CSW spoke with patients POA, Wynton Hufstetler [phone](ENID) and [phone]), at the request of patient regarding discharge plans. Patient and POA were hopeful that [facility] would give bed offer however they are unable to accept patient at this time. CSW provide POA with additional bed offers- POA chooses bed at [facility]. CSW provided POA with social work number to contact with any additional questions/ concerns. POA appreciated CSW.   POA, Kwasi Joung, would like to be contacted with any discharge planning updates/ questions.   K[place]ley Spittle, [facility] Emergency Room Clinical Social Worker [phone]

## 2017-08-29 NOTE — Progress Notes (Signed)
PROGRESS NOTE  Cory Ibarra  AVW:[id number] DOB: May 18, 1961 DOA: 08/21/2017   PCP: [name], [name]   Brief Narrative: 56 yo male presented with fever, n/v, hematuria, diarrhea.  Found to have multiple renal calculi with Rt ureteral calculus.  Required pressors in setting of septic shock and PCCM asked to assess.  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  He was admitted for febrile neutropenia and septic shock secondary to an obstructing right ureteral stone. Right-sided retrograde ureteral stent placement was unsuccessful and IR placed a right-sided percutaneous nephric nephrostomy catheter. He's grown out Escherichia coli bacteremia which is pansensitive.  [name] is currently hemodynamically stable, but has not participated actively in his care since I've been taking care of him. He has not been eating.  He has significant electrolyte abnormalities daily.  We discussed an NG tube today until he gets his appetite back which he declined.  Palliative care consult has been placed on 9/25.  Assessment & Plan: Sepsis secondary to e. Coli bacteremia  Obstructed R ureteral Stone:   - Now s/p perc nephrostomy.  Pressors now d/c'd. - s/p perc nephrostomy tube (urology unable to place retrograde stend due to severe impaction)   - ampicillin 2 q 4 (9/25 - ) ... ceftriaxone 2 grams daily (9/19 - 9/25) (vanc 9/18-9/19; zosyn 9/18-9/19) - ucx multiple species, blood cx x2 e. 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 - viral hep studies from 8/13 notable for  + Hep B core Ab and surface antibody, but negative surface antigen and negative HBV PCR  - CMET in AM  Elevated troponin: on presentation, 2/2 demand ischemia, no chest pain this AM  Diarrhea: improved, GI path panel on admission negative  Malnutrition - encouraged oral intake  Concern for depression - psych consulted  DVT prophylaxis: SCD Code Status: Partial, no CPR Family Communication: pt at bedside  Disposition Plan: pending improvement, PT/OT eval  Consultants:   PCCM, Urology, IR, Oncology  Psych  Procedures:   Perc nephrostomy tube placement  R cystoscopy with retrograde pyelogram, unsuccessful stent attempt  Antimicrobials:  Anti-infectives    Start     Dose/Rate Route Frequency Ordered Stop   08/29/17 0800  ampicillin (OMNIPEN) 2 g in sodium chloride 0.9 % 50 mL IVPB     2 g 150 mL/hr over 20 Minutes Intravenous Every 4 hours 08/28/17 1535     08/22/17 1000  cefTRIAXone (ROCEPHIN) 2 g in dextrose 5 % 50 mL IVPB  Status:  Discontinued     2 g 100 mL/hr over 30 Minutes Intravenous Every 24 hours 08/22/17 0836 08/28/17 1535   08/22/17 0000  vancomycin (VANCOCIN) IVPB 750 mg/150 ml premix  Status:  Discontinued     750 mg 150 mL/hr over 60 Minutes Intravenous Every 8 hours 08/21/17 1858 08/22/17 0836   08/21/17 2200  piperacillin-tazobactam (ZOSYN) IVPB 3.375 g  Status:  Discontinued     3.375 g 12.5 mL/hr over 240 Minutes Intravenous Every 8 hours 08/21/17 1858 08/22/17 0836   08/21/17 1530  vancomycin (VANCOCIN)  1,500 mg in sodium chloride 0.9 % 500 mL IVPB     1,500 mg 250 mL/hr over 120 Minutes Intravenous  Once 08/21/17 1525 08/21/17 1745   08/21/17 1500  piperacillin-tazobactam (ZOSYN) IVPB 3.375 g     3.375 g 100 mL/hr over 30 Minutes Intravenous  Once 08/21/17 1451 08/21/17 1550   08/21/17 1500  vancomycin (VANCOCIN) IVPB 1000 mg/200 mL premix  Status:  Discontinued     1,000 mg 200 mL/hr over 60 Minutes Intravenous  Once 08/21/17 1451 08/21/17 1525         Subjective: Pt denies concerns this AM.  Objective: Vitals:   08/28/17 2138 08/29/17 0444 08/29/17 0915 08/29/17 1136  BP: 125/74 124/74 130/64 (!) 76/50  Pulse: 91 100 95 (!) 128  Resp: _0 Temp: 99.1 F (37.3 C) 98.3 F (36.8 C) 98.6 F (37 C)   TempSrc: Oral Oral Oral   SpO2: 96% 96% 96% 98%  Weight:      Height:        Intake/Output Summary (Last 24 hours) at 08/29/17 1305 Last data filed at 08/29/17 0828  Gross per 24 hour  Intake          2755.42 ml  Output             4100 ml  Net         -1344.58 ml   Filed Weights   08/21/17 1216 08/21/17 1441 08/21/17 1643  Weight: 81.6 kg (180 lb) 81.6 kg (180 lb) 76.6 kg (168 lb 14 oz)    Physical Exam  Constitutional: Appears calm, NAD, flat affect  CVS: RRR, S1/S2 +, no murmurs, no gallops, no carotid bruit.  Pulmonary: Effort and breath sounds normal, no stridor, rhonchi, wheezes, rales.  Abdominal: Soft. BS +,  no distension, tenderness, rebound or guarding.  Musculoskeletal: Normal range of motion. No edema and no tenderness.   Data Reviewed: I have personally reviewed following labs and imaging studies  CBC:  Recent Labs Lab 08/25/17 1042 08/26/17 0322 08/27/17 0348 08/28/17 0525 08/29/17 0316  WBC 9.1 20.0* 13.4* 10.1 8.2  NEUTROABS 8.4* 18.8* 11.6* 8.2* 6.4  HGB 8.2* 8.7* 8.7* 9.6* 9.4*  HCT 23.6* 25.6* 25.2* 28.3* 27.4*  MCV 74.2* 74.9* 73.5* 75.1* 74.5*  PLT 54* 66* 63* 79* 85*   Basic Metabolic Panel:  Recent Labs Lab 08/25/17 0315 08/25/17 1042 08/26/17 0322 08/27/17 0348 08/28/17 0525 08/29/17 0316  NA 143  --  144 140 137 134*  K 3.6  --  2.9* 3.0* 3.8 3.1*  CL 110  --  111 105 103 102  CO2 23  --  _1 GLUCOSE 97  --  98 92 86 108*  BUN 9  --  _2 <5*  CREATININE 0.41*  --  0.44* 0.31* 0.33* 0.30*  CALCIUM 6.7*  --  6.5* 6.3* 6.4* 6.3*  MG 1.8  --  1.6* 1.9 1.7 1.7  PHOS <1.0* <1.0* 1.7* 1.8* 2.0* 1.8*   Liver Function Tests:  Recent Labs Lab  08/23/17 0448 08/26/17 0322 08/27/17 0348 08/28/17 0525 08/29/17 0316  AST  --  _3 ALT  --  107* 73* 57 43  ALKPHOS  --  138* 126 122 114  BILITOT  --  1.3* 1.2 1.4* 0.9  PROT  --  4.7* 4.6* 5.2* 5.2*  ALBUMIN 2.3* 2.3* 2.3* 2.5* 2.5*   CBG:  Recent Labs Lab 08/22/17 1641  GLUCAP 110*  Sepsis Labs:  Recent Labs Lab 08/23/17 0434  PROCALCITON 33.06  LATICACIDVEN 1.9    Recent Results (from the past 240 hour(s))  Culture, blood (Routine x 2)     Status: Abnormal   Collection Time: 08/21/17  1:23 PM  Result Value Ref Range Status   Specimen Description BLOOD RIGHT PORTA CATH  Final   Special Requests   Final    BOTTLES DRAWN AEROBIC AND ANAEROBIC Blood Culture adequate volume   Culture  Setup Time   Final    GRAM NEGATIVE RODS IN BOTH AEROBIC AND ANAEROBIC BOTTLES CRITICAL RESULT CALLED TO, READ BACK BY AND VERIFIED WITH: Damian Leavell [id number] MLM Performed at [facility], [place] [address], [place], [place] [address]    Culture ESCHERICHIA COLI (A)  Final   Report Status 08/24/2017 FINAL  Final   Organism ID, Bacteria ESCHERICHIA COLI  Final      Susceptibility   Escherichia coli - MIC*    AMPICILLIN <=2 SENSITIVE Sensitive     CEFAZOLIN <=4 SENSITIVE Sensitive     CEFEPIME <=1 SENSITIVE Sensitive     CEFTAZIDIME <=1 SENSITIVE Sensitive     CEFTRIAXONE <=1 SENSITIVE Sensitive     CIPROFLOXACIN <=0.25 SENSITIVE Sensitive     GENTAMICIN <=1 SENSITIVE Sensitive     IMIPENEM <=0.25 SENSITIVE Sensitive     TRIMETH/SULFA <=20 SENSITIVE Sensitive     AMPICILLIN/SULBACTAM <=2 SENSITIVE Sensitive     PIP/TAZO <=4 SENSITIVE Sensitive     Extended ESBL NEGATIVE Sensitive     * ESCHERICHIA COLI  Blood Culture ID Panel (Reflexed)     Status: Abnormal   Collection Time: 08/21/17  1:23 PM  Result Value Ref Range Status   Enterococcus species NOT DETECTED NOT DETECTED Final   Vancomycin resistance NOT DETECTED NOT DETECTED Final   Listeria  monocytogenes NOT DETECTED NOT DETECTED Final   Staphylococcus species NOT DETECTED NOT DETECTED Final   Staphylococcus aureus NOT DETECTED NOT DETECTED Final   Methicillin resistance NOT DETECTED NOT DETECTED Final   Streptococcus species NOT DETECTED NOT DETECTED Final   Streptococcus agalactiae NOT DETECTED NOT DETECTED Final   Streptococcus pneumoniae NOT DETECTED NOT DETECTED Final   Streptococcus pyogenes NOT DETECTED NOT DETECTED Final   Acinetobacter baumannii NOT DETECTED NOT DETECTED Final   Enterobacteriaceae species DETECTED (A) NOT DETECTED Final    Comment: CRITICAL RESULT CALLED TO, READ BACK BY AND VERIFIED WITH: PHARMD J GADHIA [id number] MLM    Enterobacter cloacae complex NOT DETECTED NOT DETECTED Final   Escherichia coli DETECTED (A) NOT DETECTED Final    Comment: CRITICAL RESULT CALLED TO, READ BACK BY AND VERIFIED WITH: PHARMD J GADHIA [id number] MLM    Klebsiella oxytoca NOT DETECTED NOT DETECTED Final   Klebsiella pneumoniae NOT DETECTED NOT DETECTED Final   Proteus species NOT DETECTED NOT DETECTED Final   Serratia marcescens NOT DETECTED NOT DETECTED Final   Carbapenem resistance NOT DETECTED NOT DETECTED Final   Haemophilus influenzae NOT DETECTED NOT DETECTED Final   Neisseria meningitidis NOT DETECTED NOT DETECTED Final   Pseudomonas aeruginosa NOT DETECTED NOT DETECTED Final   Candida albicans NOT DETECTED NOT DETECTED Final   Candida glabrata NOT DETECTED NOT DETECTED Final   Candida krusei NOT DETECTED NOT DETECTED Final   Candida parapsilosis NOT DETECTED NOT DETECTED Final   Candida tropicalis NOT DETECTED NOT DETECTED Final    Comment: Performed at [facility], [address] [place], [place] [address]  Culture, blood (Routine x 2)     Status: Abnormal   Collection Time: 08/21/17  2:07 PM  Result Value Ref Range Status   Specimen Description BLOOD LEFT FOREARM  Final   Special Requests   Final    BOTTLES DRAWN AEROBIC AND ANAEROBIC  Blood Culture adequate volume   Culture  Setup Time   Final    GRAM NEGATIVE RODS AEROBIC BOTTLE ONLY CRITICAL VALUE NOTED.  VALUE IS CONSISTENT WITH PREVIOUSLY REPORTED AND CALLED VALUE.    Culture (A)  Final    ESCHERICHIA COLI SUSCEPTIBILITIES PERFORMED ON PREVIOUS CULTURE WITHIN THE LAST 5 DAYS. Performed at [facility], [place] [address], [place], [place] [address]    Report Status 08/24/2017 FINAL  Final  Urine culture     Status: Abnormal   Collection Time: 08/21/17  2:50 PM  Result Value Ref Range Status   Specimen Description URINE, RANDOM  Final   Special Requests NONE  Final   Culture MULTIPLE SPECIES PRESENT, SUGGEST RECOLLECTION (A)  Final   Report Status 08/22/2017 FINAL  Final  MRSA PCR Screening     Status: Abnormal   Collection Time: 08/21/17  4:25 PM  Result Value Ref Range Status   MRSA by PCR POSITIVE (A) NEGATIVE Final    Comment:        The GeneXpert MRSA Assay (FDA approved for NASAL specimens only), is one component of a comprehensive MRSA colonization surveillance program. It is not intended to diagnose MRSA infection nor to guide or monitor treatment for MRSA infections. RESULT CALLED TO, READ BACK BY AND VERIFIED WITH: S.KOTIAN RN AT 1936 ON 08/21/17 BY S.VANHOORNE MLS   Gastrointestinal Panel by PCR , Stool     Status: None   Collection Time: 08/21/17  6:25 PM  Result Value Ref Range Status   Campylobacter species NOT DETECTED NOT DETECTED Final   Plesimonas shigelloides NOT DETECTED NOT DETECTED Final   Salmonella species NOT DETECTED NOT DETECTED Final   Yersinia enterocolitica NOT DETECTED NOT DETECTED Final   Vibrio species NOT DETECTED NOT DETECTED Final   Vibrio cholerae NOT DETECTED NOT DETECTED Final   Enteroaggregative E coli (EAEC) NOT DETECTED NOT DETECTED Final   Enteropathogenic E coli (EPEC) NOT DETECTED NOT DETECTED Final   Enterotoxigenic E coli (ETEC) NOT DETECTED NOT DETECTED Final   Shiga like toxin producing E coli  (STEC) NOT DETECTED NOT DETECTED Final   Shigella/Enteroinvasive E coli (EIEC) NOT DETECTED NOT DETECTED Final   Cryptosporidium NOT DETECTED NOT DETECTED Final   Cyclospora cayetanensis NOT DETECTED NOT DETECTED Final   Entamoeba histolytica NOT DETECTED NOT DETECTED Final   Giardia lamblia NOT DETECTED NOT DETECTED Final   Adenovirus F40/41 NOT DETECTED NOT DETECTED Final   Astrovirus NOT DETECTED NOT DETECTED Final   Norovirus GI/GII NOT DETECTED NOT DETECTED Final   Rotavirus A NOT DETECTED NOT DETECTED Final   Sapovirus (I, II, IV, and V) NOT DETECTED NOT DETECTED Final  Urine culture     Status: None   Collection Time: 08/21/17 11:15 PM  Result Value Ref Range Status   Specimen Description URINE, SUPRAPUBIC RIGHT KIDNEY  Final   Special Requests Immunocompromised  Final   Culture   Final    NO GROWTH Performed at [facility], [address], [place], [place] [address]    Report Status 08/23/2017 FINAL  Final  Culture, blood (routine x 2)     Status: None (Preliminary result)   Collection Time: 08/25/17  4:43 PM  Result Value Ref Range Status   Specimen Description BLOOD PORTA CATH  Final   Special Requests   Final    BOTTLES DRAWN AEROBIC AND ANAEROBIC Blood Culture adequate volume   Culture   Final    NO GROWTH 2 DAYS Performed at [facility], [address] [place], [place] [address]    Report Status PENDING  Incomplete  Culture, blood (routine x 2)     Status: None (Preliminary result)   Collection Time: 08/25/17  4:44 PM  Result Value Ref Range Status   Specimen Description BLOOD BLOOD LEFT HAND  Final   Special Requests   Final    BOTTLES DRAWN AEROBIC AND ANAEROBIC Blood Culture results may not be optimal due to an excessive volume of blood received in culture bottles   Culture   Final    NO GROWTH 2 DAYS Performed at [facility], [place] [address], [place], [place] [address]    Report Status PENDING  Incomplete         Radiology  Studies: No results found.      Scheduled Meds: . chlorhexidine  15 mL Mouth Rinse BID  . Chlorhexidine Gluconate Cloth  6 each Topical Daily  . dronabinol  5 mg Oral QAC lunch  . feeding supplement (ENSURE ENLIVE)  237 mL Oral BID BM  . mouth rinse  15 mL Mouth Rinse q12n4p  . OLANZapine zydis  2.5 mg Oral QHS  . pantoprazole  40 mg Oral Daily  . potassium chloride  40 mEq Oral BID  . sodium chloride flush  10-40 mL Intracatheter Q12H  . sodium chloride flush  5 mL Intravenous Q8H  . thiamine  100 mg Oral Daily   Or  . thiamine injection  100 mg Intravenous Daily   Continuous Infusions: . ampicillin (OMNIPEN) IV Stopped (08/29/17 1209)  . dextrose 5 % and 0.9% NaCl 1,000 mL (08/28/17 1441)  . lactated ringers 75 mL/hr at 08/28/17 0545  . potassium PHOSPHATE IVPB (mmol)       LOS: 8 days    Time spent: 25 minutes   Faye Ramsay, MD [facility] [phone]   If 7PM-7AM, please contact night-coverage [email or web address] Password [email or web address] 08/29/2017, 1:05 PM

## 2017-08-29 NOTE — Progress Notes (Signed)
   08/29/17 0900  Clinical Encounter Type  Visited With Patient  Visit Type Follow-up;Psychological support;Spiritual support  Referral From Nurse  Consult/Referral To Chaplain  Spiritual Encounters  Spiritual Needs Prayer;Emotional;Other (Comment) (Advance Directive )  Stress Factors  Patient Stress Factors Health changes;Major life changes;Other (Comment) (Advance Directive )  Advance Directives (For Healthcare)  Does Patient Have a Medical Advance Directive? No  Does patient want to make changes to medical advance directive? Yes (Inpatient - patient requests chaplain consult to change a medical advance directive) (Patient's Brother Has Document, Needs Notarization )  Type of Advance Directive Healthcare Power of Attorney   I followed up with the patient per Spiritual Care consult. The patient's brother requested help finalizing the patient's Advance Directive naming him as the patient's healthcare power of attorney.  I followed up with the patient, who was much receptive to a visit today. He requested that I pray with him for "complete healing." The patient states that his faith brings him comfort.  Mr. Peavy stated th at praying helped to start him off in a better way today. He requested that I follow up with him.  We discussed the request for an Advance directive, and the patient stated that this is something that he wants.  I will come up and notarize the document when needed.   Please, contact Spiritual Care for further assistance.   [facility]

## 2017-08-29 NOTE — Progress Notes (Signed)
Physical Therapy Treatment Patient Details Name: Cory Ibarra MRN: [id number] DOB: 11/18/1961 Today's Date: 08/29/2017    History of Present Illness 56 yo male presented with fever, n/v, hematuria, diarrhea.  Found to have multiple renal calculi with Rt ureteral calculus.  Required pressors in setting of septic shock and PCCM asked to assess.  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  Assisted from supine to EOB pt immediately c/o dizziness.  Allowed increased time to sit as symptoms ceased.  Very weak, pt required + 2 assist to stand and amb only 4 feet as dizziness returned.  BP standing just long enough to record BP 76/50.  Recliner already behind pt, assisted back to supine recovery position.  Encourage pt to sit upright as long as he can.  Pt will need ST Rehab at SNF before returning home with brother.    Follow Up Recommendations  SNF;Supervision/Assistance - 24 hour     Equipment Recommendations  Other (comment);None recommended by PT    Recommendations for Other Services       Precautions / Restrictions Precautions Precautions: Fall Restrictions Weight Bearing Restrictions: No    Mobility  Bed Mobility Overal bed mobility: Needs Assistance Bed Mobility: Supine to Sit     Supine to sit: Min assist;Mod assist     General bed mobility comments: Mod Assist for upper body and increased time to scoot to EOB.  Instant c/o dizziness.    Transfers Overall transfer level: Needs assistance Equipment used: Rolling walker (2 wheeled) Transfers: Sit to/from [facility] Sit to Stand: Mod assist;Max assist;+2 physical assistance;+2 safety/equipment;From elevated surface Stand pivot transfers: Mod assist;Max assist;+2 physical assistance;+2 safety/equipment       General transfer comment: 50% VC's on proper hand placement and turn completion prior to sit.   Very weak.  Mod rigid.  Delayed movements.    Ambulation/Gait Ambulation/Gait assistance: Min assist;Mod assist Ambulation Distance (Feet): 4 Feet Assistive device: Rolling walker (2 wheeled) Gait Pattern/deviations: Step-to pattern;Step-through pattern;Decreased step length - right;Decreased step length - left;Narrow base of support Gait velocity: decreased   General Gait Details: very limited distance due to max c/o dizziness BP taken 76/50.  Recliner already behind pt and quickly positioned in supine.  Notified RN.   Stairs            Wheelchair Mobility    Modified Rankin (Stroke Patients Only)       Balance                                            Cognition Arousal/Alertness: Awake/alert Behavior During Therapy: Flat affect                                   General Comments: AxO x 4 poor engagement, little eye contact and flat affect      Exercises      General Comments        Pertinent Vitals/Pain Pain Assessment: No/denies pain    Home Living                      Prior Function            PT  Goals (current goals can now be found in the care plan section) Progress towards PT goals: Progressing toward goals    Frequency    Min 3X/week      PT Plan Current plan remains appropriate    Co-evaluation              AM-PAC PT "6 Clicks" Daily Activity  Outcome Measure  Difficulty turning over in bed (including adjusting bedclothes, sheets and blankets)?: Unable Difficulty moving from lying on back to sitting on the side of the bed? : Unable Difficulty sitting down on and standing up from a chair with arms (e.g., wheelchair, bedside commode, etc,.)?: Unable Help needed moving to and from a bed to chair (including a wheelchair)?: Total Help needed walking in hospital room?: Total Help needed climbing 3-5 steps with a railing? : Total 6 Click Score: 6    End of Session Equipment Utilized  During Treatment: Gait belt Activity Tolerance: Patient limited by fatigue Patient left: in chair;with call bell/phone within reach Nurse Communication: Mobility status PT Visit Diagnosis: Muscle weakness (generalized) (M62.81)     Time: 0349-1791 PT Time Calculation (min) (ACUTE ONLY): 23 min  Charges:                       G Codes:       {Heleena Miceli  PTA [facility]  Acute  Rehab Pager      [phone]

## 2017-08-29 NOTE — Progress Notes (Signed)
CRITICAL VALUE ALERT  Critical Value:  Ca 6.3  Date & Time Notied:  08/29/17 0330  Provider Notified: Schorr  Orders Received/Actions taken:

## 2017-08-30 ENCOUNTER — Ambulatory Visit: Payer: Self-pay

## 2017-08-30 ENCOUNTER — Other Ambulatory Visit: Payer: Self-pay

## 2017-08-30 ENCOUNTER — Ambulatory Visit: Payer: Self-pay | Admitting: Hematology and Oncology

## 2017-08-30 DIAGNOSIS — N135 Crossing vessel and stricture of ureter without hydronephrosis: Secondary | ICD-10-CM

## 2017-08-30 DIAGNOSIS — C8338 Diffuse large B-cell lymphoma, lymph nodes of multiple sites: Secondary | ICD-10-CM

## 2017-08-30 LAB — CBC WITH DIFFERENTIAL/PLATELET
BASOS ABS: 0 10*3/uL (ref 0.0–0.1)
Basophils Relative: 0 %
EOS ABS: 0 10*3/uL (ref 0.0–0.7)
Eosinophils Relative: 0 %
HCT: 26.5 % — ABNORMAL LOW (ref 39.0–52.0)
Hemoglobin: 9 g/dL — ABNORMAL LOW (ref 13.0–17.0)
LYMPHS PCT: 11 %
Lymphs Abs: 0.7 10*3/uL (ref 0.7–4.0)
MCH: 25.4 pg — AB (ref 26.0–34.0)
MCHC: 34 g/dL (ref 30.0–36.0)
MCV: 74.9 fL — ABNORMAL LOW (ref 78.0–100.0)
Monocytes Absolute: 0.4 10*3/uL (ref 0.1–1.0)
Monocytes Relative: 7 %
NEUTROS ABS: 5.1 10*3/uL (ref 1.7–7.7)
NEUTROS PCT: 82 %
PLATELETS: 120 10*3/uL — AB (ref 150–400)
RBC: 3.54 MIL/uL — AB (ref 4.22–5.81)
RDW: 19.5 % — ABNORMAL HIGH (ref 11.5–15.5)
WBC: 6.2 10*3/uL (ref 4.0–10.5)

## 2017-08-30 LAB — COMPREHENSIVE METABOLIC PANEL
ALT: 32 U/L (ref 17–63)
AST: 15 U/L (ref 15–41)
Albumin: 2.3 g/dL — ABNORMAL LOW (ref 3.5–5.0)
Alkaline Phosphatase: 106 U/L (ref 38–126)
Anion gap: 8 (ref 5–15)
BUN: <5 mg/dL — ABNORMAL LOW (ref 6–20)
CHLORIDE: 104 mmol/L (ref 101–111)
CO2: 23 mmol/L (ref 22–32)
Calcium: 6.2 mg/dL — CL (ref 8.9–10.3)
Creatinine, Ser: <0.3 mg/dL — ABNORMAL LOW (ref 0.61–1.24)
Glucose, Bld: 109 mg/dL — ABNORMAL HIGH (ref 65–99)
POTASSIUM: 3.4 mmol/L — AB (ref 3.5–5.1)
SODIUM: 135 mmol/L (ref 135–145)
Total Bilirubin: 1.2 mg/dL (ref 0.3–1.2)
Total Protein: 5 g/dL — ABNORMAL LOW (ref 6.5–8.1)

## 2017-08-30 LAB — MAGNESIUM: MAGNESIUM: 1.6 mg/dL — AB (ref 1.7–2.4)

## 2017-08-30 LAB — PHOSPHORUS: PHOSPHORUS: 1.7 mg/dL — AB (ref 2.5–4.6)

## 2017-08-30 MED ORDER — COSYNTROPIN 0.25 MG IJ SOLR
0.2500 mg | Freq: Once | INTRAMUSCULAR | Status: AC
  Administered 2017-08-31: 0.25 mg via INTRAVENOUS
  Filled 2017-08-30: qty 0.25

## 2017-08-30 MED ORDER — POTASSIUM PHOSPHATES 15 MMOLE/5ML IV SOLN
30.0000 mmol | Freq: Once | INTRAVENOUS | Status: AC
  Administered 2017-08-30: 30 mmol via INTRAVENOUS
  Filled 2017-08-30: qty 10

## 2017-08-30 MED ORDER — SODIUM CHLORIDE 0.9 % IV SOLN
2.0000 g | Freq: Once | INTRAVENOUS | Status: AC
  Administered 2017-08-30: 2 g via INTRAVENOUS
  Filled 2017-08-30: qty 20

## 2017-08-30 MED ORDER — MAGNESIUM SULFATE 4 GM/100ML IV SOLN
4.0000 g | Freq: Once | INTRAVENOUS | Status: AC
  Administered 2017-08-30: 4 g via INTRAVENOUS
  Filled 2017-08-30: qty 100

## 2017-08-30 MED ORDER — POTASSIUM CHLORIDE CRYS ER 20 MEQ PO TBCR: 40.0000 meq | EXTENDED_RELEASE_TABLET | Freq: Three times a day (TID) | ORAL | Status: DC

## 2017-08-30 MED ORDER — POTASSIUM CHLORIDE 20 MEQ/15ML (10%) PO SOLN
40.0000 meq | Freq: Two times a day (BID) | ORAL | Status: DC
  Administered 2017-08-30 – 2017-08-31 (×2): 40 meq via ORAL
  Filled 2017-08-30 (×7): qty 30

## 2017-08-30 NOTE — Consult Note (Signed)
IP PROGRESS NOTE Requesting provider: Dr Doreatha Lew  Subjective:  Patient continues to take minimal amount of food if anything at all. Activity level is slightly better yesterday than during the previous days. Still unable to articulate she refuses food other than lack of appetite. He appears to understand that without reasonable food intake, he is life will be in jeopardy regardless of his cancer diagnosis.  Objective: Vital signs in last 24 hours: Blood pressure 126/71, pulse 96, temperature 98.5 F (36.9 C), resp. rate 18, height 6\' 2"  (1.88 m), weight 168 lb 14 oz (76.6 kg), SpO2 97 %.  Intake/Output from previous day: 09/26 0701 - 09/27 0700 In: 1860 [I.V.:1610; IV Piggyback:250] Out: 2375 [Urine:2175; Drains:200]  Physical Exam: Gen: Patient is alert, awake oriented 3, affect is flattened compared to his previous baseline. HEENT: Unremarkable Lungs: clear to auscultation anteriorly bilaterally. Cardiac: Regular, S1/S2. No murmurs Abdomen: Abdomen soft, nontender nondistended. Her distention has resolved. Percutaneous nephrostomy on the right side is in place draining clear but darkly colored urine. Extremities: No peripheral edema   Portacath/PICC-without erythema  Lab Results:  Recent Labs  08/29/17 0316 08/30/17 0426  WBC 8.2 6.2  HGB 9.4* 9.0*  HCT 27.4* 26.5*  PLT 85* 120*    BMET  Recent Labs  08/29/17 0316 08/30/17 0426  NA 134* 135  K 3.1* 3.4*  CL 102 104  CO2 24 23  GLUCOSE 108* 109*  BUN <5* <5*  CREATININE 0.30* <0.30*  CALCIUM 6.3* 6.2*    Lab Results  Component Value Date   CEA1 0.7 06/28/2017    Studies/Results: No results found.  Medications: I have reviewed the patient's current medications.  Assessment/Plan: 56 year old male with diffuse large cell lymphoma undergoing systemic chemotherapy with [name], presenting with neutropenic fever with progression to septic shock likely precipitated by infection above obstructed  right ureter. Currently, right kidneys drained by percutaneous nephrostomy. Neutropenia has resolved. Patient remains afebrile. Presently, most prominent problems include physical deconditioning and poor oral intake with mixed calorie/protein malnutrition. All of the above is being addressed by the primary hospitalist service. Agree that signs point at significant amount of depression leading to lack of appetite compounding loss of appetite from chemotherapy.  Recommendations: --Agree with assessment of patient developing dysphoria/depression due to chronic medical condition and severity over the current hospitalization. --Cosyntropin stim test for possible adrenal failure contribution considering orthostatic hypotension. Alternatively, consider starting prednisone 10-20 mg daily --Recommend transfusion support as follows: Use irradiated products only, Hgb <=7.0 -- transfuse 1U pRBC and re-check Hgb, Plt <= 20 -- transfuse 1 pack Plt --Disposition: The discretion of the primary hospitalist service. Agree with the current plan to proceed with skilled nursing facility for physical rehabilitation on discharge. Currently, patient has a scheduled appointment with me on Thursday. I will keep that on the books unless the patient is still hospitalized on that day. He is currently scheduled round of chemotherapy will be postponed by at least 1 week to allow for improved physical and emotional recovery.   LOS: 9 days   Ardath Sax, MD   08/30/2017, 7:20 AM

## 2017-08-30 NOTE — Progress Notes (Addendum)
PROGRESS NOTE  Cory Ibarra  OJJ:[id number] DOB: February 18, 1961 DOA: 08/21/2017   PCP: [name], [name]   Brief Narrative: 56 yo male presented with fever, n/v, hematuria, diarrhea.  Found to have multiple renal calculi with Rt ureteral calculus.  Required pressors in setting of septic shock and PCCM asked to assess.  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  He was admitted for febrile neutropenia and septic shock secondary to an obstructing right ureteral stone. Right-sided retrograde ureteral stent placement was unsuccessful and IR placed a right-sided percutaneous nephric nephrostomy catheter. He's grown out Escherichia coli bacteremia which is pansensitive.  [name] is currently hemodynamically stable, but has not participated actively in his care since I've been taking care of him. He has not been eating.  He has significant electrolyte abnormalities daily.  We discussed an NG tube today until he gets his appetite back which he declined.  Palliative care consult has been placed on 9/25.  Assessment & Plan: Sepsis secondary to e. Coli bacteremia  Obstructed R ureteral Stone:   - Now s/p perc nephrostomy.  Pressors now d/c'd. - s/p perc nephrostomy tube (urology unable to place retrograde stend due to severe impaction)   - ampicillin 2 q 4 (9/25 - ) ... ceftriaxone 2 grams daily (9/19 - 9/25) (vanc 9/18-9/19; zosyn 9/18-9/19) - ucx multiple species, blood cx x2 e. 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 - viral hep studies from 8/13 notable for + Hep B core Ab and surface antibody, but negative surface antigen and negative HBV PCR  - CMET in AM  Elevated troponin: on presentation, 2/2 demand ischemia, no chest pain this AM   Diarrhea: improved, GI path panel on admission negative  Malnutrition - encouraged oral intake  Concern for depression - psych consulted  DVT prophylaxis: SCD Code Status: Partial, no CPR Family Communication: pt at bedside  Disposition Plan: pending improvement   Consultants:   PCCM, Urology, IR, Oncology  Psych  Procedures:   Perc nephrostomy tube placement  R cystoscopy with retrograde pyelogram, unsuccessful stent attempt  Antimicrobials:  Anti-infectives    Start     Dose/Rate Route Frequency Ordered Stop   08/29/17 0800  ampicillin (OMNIPEN) 2 g in sodium chloride 0.9 % 50 mL IVPB     2 g 150 mL/hr over 20 Minutes Intravenous Every 4 hours 08/28/17 1535     08/22/17 1000  cefTRIAXone (ROCEPHIN) 2 g in dextrose 5 % 50 mL IVPB  Status:  Discontinued     2 g 100 mL/hr over 30 Minutes Intravenous Every 24 hours 08/22/17 0836 08/28/17 1535   08/22/17 0000  vancomycin (VANCOCIN) IVPB 750 mg/150 ml premix  Status:  Discontinued     750 mg 150 mL/hr over 60 Minutes Intravenous Every 8 hours  08/21/17 1858 08/22/17 0836   08/21/17 2200  piperacillin-tazobactam (ZOSYN) IVPB 3.375 g  Status:  Discontinued     3.375 g 12.5 mL/hr over 240 Minutes Intravenous Every 8 hours 08/21/17 1858 08/22/17 0836   08/21/17 1530  vancomycin (VANCOCIN) 1,500 mg in sodium chloride 0.9 % 500 mL IVPB     1,500 mg 250 mL/hr over 120 Minutes Intravenous  Once 08/21/17 1525 08/21/17 1745   08/21/17 1500  piperacillin-tazobactam (ZOSYN) IVPB 3.375 g      3.375 g 100 mL/hr over 30 Minutes Intravenous  Once 08/21/17 1451 08/21/17 1550   08/21/17 1500  vancomycin (VANCOCIN) IVPB 1000 mg/200 mL premix  Status:  Discontinued     1,000 mg 200 mL/hr over 60 Minutes Intravenous  Once 08/21/17 1451 08/21/17 1525      Subjective: Pt reports feeling better.   Objective: Vitals:   08/29/17 1356 08/29/17 2035 08/30/17 0443 08/30/17 1548  BP: 113/73 114/67 126/71 114/64  Pulse: (!) 106 93 96 97  Resp: 18 18    Temp: 98.9 F (37.2 C) 98.9 F (37.2 C) 98.5 F (36.9 C) 98.7 F (37.1 C)  TempSrc: Oral Oral  Axillary  SpO2: 96% 96% 97% 95%  Weight:      Height:        Intake/Output Summary (Last 24 hours) at 08/30/17 1814 Last data filed at 08/30/17 1018  Gross per 24 hour  Intake             1760 ml  Output             1600 ml  Net              160 ml   Filed Weights   08/21/17 1216 08/21/17 1441 08/21/17 1643  Weight: 81.6 kg (180 lb) 81.6 kg (180 lb) 76.6 kg (168 lb 14 oz)    Physical Exam  Constitutional: Appears calm, NAD CVS: RRR, S1/S2 +, no murmurs, no gallops, no carotid bruit.  Pulmonary: Effort and breath sounds normal, no stridor, rhonchi, wheezes, rales.  Abdominal: Soft. BS +,  no distension, tenderness, rebound or guarding.  Musculoskeletal: Normal range of motion. No edema and no tenderness.   Data Reviewed: I have personally reviewed following labs and imaging studies  CBC:  Recent Labs Lab 08/26/17 0322 08/27/17 0348 08/28/17 0525 08/29/17 0316 08/30/17 0426  WBC 20.0* 13.4* 10.1 8.2 6.2  NEUTROABS 18.8* 11.6* 8.2* 6.4 5.1  HGB 8.7* 8.7* 9.6* 9.4* 9.0*  HCT 25.6* 25.2* 28.3* 27.4* 26.5*  MCV 74.9* 73.5* 75.1* 74.5* 74.9*  PLT 66* 63* 79* 85* 300*   Basic Metabolic Panel:  Recent Labs Lab 08/26/17 0322 08/27/17 0348 08/28/17 0525 08/29/17 0316 08/30/17 0426  NA 144 140 137 134* 135  K 2.9* 3.0* 3.8 3.1* 3.4*  CL 111 105 103 102 104  CO2 _0 GLUCOSE 98 92 86 108* 109*  BUN _1 <5* <5*  CREATININE 0.44* 0.31* 0.33* 0.30* <0.30*  CALCIUM 6.5* 6.3* 6.4* 6.3* 6.2*  MG 1.6* 1.9 1.7 1.7 1.6*  PHOS 1.7* 1.8* 2.0* 1.8* 1.7*   Liver Function Tests:  Recent Labs Lab 08/26/17 0322 08/27/17 0348 08/28/17 0525 08/29/17 0316 08/30/17 0426  AST _2 ALT 107* 73* 57 43 32  ALKPHOS 138* 126 122 114 106  BILITOT 1.3* 1.2 1.4* 0.9 1.2  PROT 4.7* 4.6* 5.2* 5.2* 5.0*  ALBUMIN 2.3* 2.3* 2.5* 2.5* 2.3*   Recent  Results (from the past 240 hour(s))  Culture, blood (Routine x 2)     Status: Abnormal   Collection Time: 08/21/17  1:23 PM  Result Value Ref Range Status   Specimen Description BLOOD RIGHT PORTA CATH  Final   Special Requests   Final    BOTTLES DRAWN AEROBIC AND ANAEROBIC Blood Culture adequate volume   Culture  Setup Time   Final    GRAM NEGATIVE RODS IN BOTH AEROBIC AND ANAEROBIC BOTTLES CRITICAL RESULT CALLED TO, READ BACK BY AND VERIFIED WITH: Damian Leavell [id number] MLM Performed at [facility], [place] [address], [place], [place] [address]    Culture ESCHERICHIA COLI (A)  Final   Report Status 08/24/2017 FINAL  Final   Organism ID, Bacteria ESCHERICHIA COLI  Final      Susceptibility   Escherichia coli - MIC*    AMPICILLIN <=2 SENSITIVE Sensitive     CEFAZOLIN <=4 SENSITIVE Sensitive     CEFEPIME <=1 SENSITIVE Sensitive     CEFTAZIDIME <=1 SENSITIVE Sensitive     CEFTRIAXONE <=1 SENSITIVE Sensitive     CIPROFLOXACIN <=0.25 SENSITIVE Sensitive     GENTAMICIN <=1 SENSITIVE Sensitive     IMIPENEM <=0.25 SENSITIVE Sensitive     TRIMETH/SULFA <=20 SENSITIVE Sensitive     AMPICILLIN/SULBACTAM <=2 SENSITIVE Sensitive     PIP/TAZO <=4 SENSITIVE Sensitive     Extended ESBL NEGATIVE Sensitive     * ESCHERICHIA COLI  Blood Culture ID Panel (Reflexed)     Status: Abnormal   Collection Time: 08/21/17  1:23 PM  Result Value Ref Range Status   Enterococcus species NOT DETECTED NOT DETECTED Final   Vancomycin resistance NOT DETECTED NOT  DETECTED Final   Listeria monocytogenes NOT DETECTED NOT DETECTED Final   Staphylococcus species NOT DETECTED NOT DETECTED Final   Staphylococcus aureus NOT DETECTED NOT DETECTED Final   Methicillin resistance NOT DETECTED NOT DETECTED Final   Streptococcus species NOT DETECTED NOT DETECTED Final   Streptococcus agalactiae NOT DETECTED NOT DETECTED Final   Streptococcus pneumoniae NOT DETECTED NOT DETECTED Final   Streptococcus pyogenes NOT DETECTED NOT DETECTED Final   Acinetobacter baumannii NOT DETECTED NOT DETECTED Final   Enterobacteriaceae species DETECTED (A) NOT DETECTED Final    Comment: CRITICAL RESULT CALLED TO, READ BACK BY AND VERIFIED WITH: PHARMD J GADHIA [id number] MLM    Enterobacter cloacae complex NOT DETECTED NOT DETECTED Final   Escherichia coli DETECTED (A) NOT DETECTED Final    Comment: CRITICAL RESULT CALLED TO, READ BACK BY AND VERIFIED WITH: PHARMD J GADHIA [id number] MLM    Klebsiella oxytoca NOT DETECTED NOT DETECTED Final   Klebsiella pneumoniae NOT DETECTED NOT DETECTED Final   Proteus species NOT DETECTED NOT DETECTED Final   Serratia marcescens NOT DETECTED NOT DETECTED Final   Carbapenem resistance NOT DETECTED NOT DETECTED Final   Haemophilus influenzae NOT DETECTED NOT DETECTED Final   Neisseria meningitidis NOT DETECTED NOT DETECTED Final   Pseudomonas aeruginosa NOT DETECTED NOT DETECTED Final   Candida albicans NOT DETECTED NOT DETECTED Final   Candida glabrata NOT DETECTED NOT DETECTED Final   Candida krusei NOT DETECTED NOT DETECTED Final   Candida parapsilosis NOT DETECTED NOT DETECTED Final   Candida tropicalis NOT DETECTED NOT DETECTED Final    Comment: Performed at [facility], [address] [place],  [address]  Culture, blood (Routine x 2)     Status: Abnormal   Collection Time: 08/21/17  2:07  PM  Result Value Ref Range Status   Specimen Description BLOOD LEFT FOREARM  Final   Special Requests   Final    BOTTLES  DRAWN AEROBIC AND ANAEROBIC Blood Culture adequate volume   Culture  Setup Time   Final    GRAM NEGATIVE RODS AEROBIC BOTTLE ONLY CRITICAL VALUE NOTED.  VALUE IS CONSISTENT WITH PREVIOUSLY REPORTED AND CALLED VALUE.    Culture (A)  Final    ESCHERICHIA COLI SUSCEPTIBILITIES PERFORMED ON PREVIOUS CULTURE WITHIN THE LAST 5 DAYS. Performed at [facility], [place] [address] [place], [place] [address]    Report Status 08/24/2017 FINAL  Final  Urine culture     Status: Abnormal   Collection Time: 08/21/17  2:50 PM  Result Value Ref Range Status   Specimen Description URINE, RANDOM  Final   Special Requests NONE  Final   Culture MULTIPLE SPECIES PRESENT, SUGGEST RECOLLECTION (A)  Final   Report Status 08/22/2017 FINAL  Final  MRSA PCR Screening     Status: Abnormal   Collection Time: 08/21/17  4:25 PM  Result Value Ref Range Status   MRSA by PCR POSITIVE (A) NEGATIVE Final    Comment:        The GeneXpert MRSA Assay (FDA approved for NASAL specimens only), is one component of a comprehensive MRSA colonization surveillance program. It is not intended to diagnose MRSA infection nor to guide or monitor treatment for MRSA infections. RESULT CALLED TO, READ BACK BY AND VERIFIED WITH: S.KOTIAN RN AT 1936 ON 08/21/17 BY S.VANHOORNE MLS   Gastrointestinal Panel by PCR , Stool     Status: None   Collection Time: 08/21/17  6:25 PM  Result Value Ref Range Status   Campylobacter species NOT DETECTED NOT DETECTED Final   Plesimonas shigelloides NOT DETECTED NOT DETECTED Final   Salmonella species NOT DETECTED NOT DETECTED Final   Yersinia enterocolitica NOT DETECTED NOT DETECTED Final   Vibrio species NOT DETECTED NOT DETECTED Final   Vibrio cholerae NOT DETECTED NOT DETECTED Final   Enteroaggregative E coli (EAEC) NOT DETECTED NOT DETECTED Final   Enteropathogenic E coli (EPEC) NOT DETECTED NOT DETECTED Final   Enterotoxigenic E coli (ETEC) NOT DETECTED NOT DETECTED Final   Shiga  like toxin producing E coli (STEC) NOT DETECTED NOT DETECTED Final   Shigella/Enteroinvasive E coli (EIEC) NOT DETECTED NOT DETECTED Final   Cryptosporidium NOT DETECTED NOT DETECTED Final   Cyclospora cayetanensis NOT DETECTED NOT DETECTED Final   Entamoeba histolytica NOT DETECTED NOT DETECTED Final   Giardia lamblia NOT DETECTED NOT DETECTED Final   Adenovirus F40/41 NOT DETECTED NOT DETECTED Final   Astrovirus NOT DETECTED NOT DETECTED Final   Norovirus GI/GII NOT DETECTED NOT DETECTED Final   Rotavirus A NOT DETECTED NOT DETECTED Final   Sapovirus (I, II, IV, and V) NOT DETECTED NOT DETECTED Final  Urine culture     Status: None   Collection Time: 08/21/17 11:15 PM  Result Value Ref Range Status   Specimen Description URINE, SUPRAPUBIC RIGHT KIDNEY  Final   Special Requests Immunocompromised  Final   Culture   Final    NO GROWTH Performed at [facility], [address] [place], [place] [address]    Report Status 08/23/2017 FINAL  Final  Culture, blood (routine x 2)     Status: None (Preliminary result)   Collection Time: 08/25/17  4:43 PM  Result Value Ref Range Status   Specimen Description BLOOD PORTA CATH  Final  Special Requests   Final    BOTTLES DRAWN AEROBIC AND ANAEROBIC Blood Culture adequate volume   Culture   Final    NO GROWTH 4 DAYS Performed at [facility], [place] [address] [place], [place] [address]    Report Status PENDING  Incomplete  Culture, blood (routine x 2)     Status: None (Preliminary result)   Collection Time: 08/25/17  4:44 PM  Result Value Ref Range Status   Specimen Description BLOOD BLOOD LEFT HAND  Final   Special Requests   Final    BOTTLES DRAWN AEROBIC AND ANAEROBIC Blood Culture results may not be optimal due to an excessive volume of blood received in culture bottles   Culture   Final    NO GROWTH 4 DAYS Performed at [facility], [place] [address] [place], [place] [address]    Report Status PENDING  Incomplete      Radiology Studies: No results found.   Scheduled Meds: . chlorhexidine  15 mL Mouth Rinse BID  . Chlorhexidine Gluconate Cloth  6 each Topical Daily  . [START ON 08/31/2017] cosyntropin  0.25 mg Intravenous Once  . dronabinol  5 mg Oral QAC lunch  . feeding supplement (ENSURE ENLIVE)  237 mL Oral BID BM  . mouth rinse  15 mL Mouth Rinse q12n4p  . OLANZapine zydis  2.5 mg Oral QHS  . pantoprazole  40 mg Oral Daily  . potassium chloride  40 mEq Oral BID  . sodium chloride flush  10-40 mL Intracatheter Q12H  . sodium chloride flush  5 mL Intravenous Q8H  . thiamine  100 mg Oral Daily   Or  . thiamine injection  100 mg Intravenous Daily   Continuous Infusions: . ampicillin (OMNIPEN) IV Stopped (08/30/17 1548)  . dextrose 5 % and 0.9% NaCl 75 mL/hr at 08/29/17 2041  . lactated ringers 75 mL/hr at 08/28/17 0545  . potassium PHOSPHATE IVPB (mmol) 30 mmol (08/30/17 1528)     LOS: 9 days    Time spent: 25 minutes   Faye Ramsay, MD [facility] [phone]   If 7PM-7AM, please contact night-coverage [email or web address] Password [facility] 08/30/2017, 6:14 PM

## 2017-08-30 NOTE — Consult Note (Signed)
[facility] Consult   Reason for Consult:  Depression and loss of appetite Referring Physician:  Dr. Doyle Askew Patient Identification: Cory Ibarra MRN:  [id number] Principal Diagnosis: Septic shock due to Escherichia coli [facility]) Diagnosis:   Patient Active Problem List   Diagnosis Date Noted  . Adjustment disorder with mixed anxiety and depressed mood [F43.23]   . Palliative care encounter [Z51.5]   . Goals of care, counseling/discussion [Z71.89]   . Septic shock due to Escherichia coli ([place]) [A41.51, R65.21]   . Neutropenia ([place]) [D70.9] 08/21/2017  . Gross hematuria [R31.0]   . Lactic acidosis [E87.2]   . Neutropenic sepsis ([place]) [A41.9, D70.9]   . Hypophosphatemia [E83.39] 08/20/2017  . 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 Bony metastasis ([place]) [C79.51] 06/29/2017  . Chest wall mass [R22.2]   . Retroperitoneal mass [R19.00] 06/28/2017  . Closed compression fracture of L3 lumbar vertebra ([place]) [S32.030A] 06/28/2017  . Severe back pain [M54.9] 06/28/2017  . Weight loss, unintentional [R63.4] 06/28/2017    Total Time spent with patient: 20 minutes  Subjective:   Cory Ibarra is a 56 y.o. male patient admitted with fever, n/v, hematuria, diarrhea. Found to have multiple renal calculi with Rt ureteral calculus. Required pressors in setting of septic shock and PCCM asked to assess. 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 Patient states no SI/HI/AVH. He is depressed due to the cancer and is having difficulty coping with it. He feels the medications may help but 1:1 counseling would be more beneficial. He reports that the Zyprexa started last night was ok and he slept good. He reports wanting to eat but just doesn't have a good appetite.  Objective: Reviewed chart and findings with treatment team and patient has flat affect and appears depressed. He has a lot of stressors but feel patient would  be better to be seen in outpatient or 1:1 while admitted on medical floor. CSW requested to set up 1:1 counseling for patient. Will continue Zyprexa as it can increase appetite and improve depression.   HPI:  56 yo male presented with fever, n/v, hematuria, diarrhea. Found to have multiple renal calculi with Rt ureteral calculus. Required pressors in setting of septic shock and PCCM asked to assess. 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  He was admitted for febrile neutropenia and septic shock secondary to an obstructing right ureteral stone. Right-sided retrograde ureteral stent placement was unsuccessful and IR placed a right-sided percutaneous nephric nephrostomy catheter. He's grown out Escherichia coli bacteremia which is pansensitive.  Cory Ibarra is currently hemodynamically stable, but has not participated actively in his care since I've been taking care of him. He has not been eating.  He has significant electrolyte abnormalities daily.  We discussed an NG tube today until he gets his appetite back which he declined.  Past Psychiatric History: Denies  Risk to Self: Is patient at risk for suicide?: No Risk to Others:   Prior Inpatient Therapy:   Prior Outpatient Therapy:    Past Medical History:  Past Medical History:  Diagnosis Date  . Cancer ([place])   . Tendonitis     Past Surgical History:  Procedure Laterality Date  . CYSTOSCOPY W/ URETERAL STENT PLACEMENT Right 08/21/2017   Procedure: CYSTOSCOPY WITH RETROGRADE PYELOGRAM, RIGHT;  Surgeon: Alexis Frock, MD;  Location: [facility];  Service: Urology;  Laterality: Right;  . HERNIA  REPAIR  1988   Dr. Vida Rigger  . IR FLUORO GUIDE PORT INSERTION RIGHT  07/26/2017  . IR NEPHROSTOMY PLACEMENT RIGHT  08/21/2017  . IR US GUIDE VASC ACCESS RIGHT  07/26/2017   Family History:  Family History  Problem Relation Age of Onset  . Cancer Father    Family Psychiatric  History:  Denies Social History:  History  Alcohol Use  . Yes    Comment: occasionally/socially     History  Drug Use No    Social History   Social History  . Marital status: Single    Spouse name: N/A  . Number of children: 0  . Years of education: N/A   Social History Main Topics  . Smoking status: Never Smoker  . Smokeless tobacco: Never Used  . Alcohol use Yes     Comment: occasionally/socially  . Drug use: No  . Sexual activity: No   Other Topics Concern  . 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 Never used smokeless tobacco.   No e-cigarettes   Occasional alcohol consumption socially.   Single, no children.   Additional Social History:    Allergies:   Allergies  Allergen Reactions  . Bee Venom Swelling    Labs:  Results for orders placed or performed during the hospital encounter of 08/21/17 (from the past 48 hour(s))  Comprehensive metabolic panel     Status: Abnormal   Collection Time: 08/29/17  3:16 AM  Result Value Ref Range   Sodium 134 (L) 135 - 145 mmol/L   Potassium 3.1 (L) 3.5 - 5.1 mmol/L    Comment: DELTA CHECK NOTED REPEATED TO VERIFY    Chloride 102 101 - 111 mmol/L   CO2 24 22 - 32 mmol/L   Glucose, Bld 108 (H) 65 - 99 mg/dL   BUN <5 (L) 6 - 20 mg/dL   Creatinine, Ser 0.30 (L) 0.61 - 1.24 mg/dL   Calcium 6.3 (LL) 8.9 - 10.3 mg/dL    Comment: CRITICAL RESULT CALLED TO, READ BACK BY AND VERIFIED WITHDenton Lank RN 0401 08/29/17 A NAVARRO    Total Protein 5.2 (L) 6.5 - 8.1 g/dL   Albumin 2.5 (L) 3.5 - 5.0 g/dL   AST 17 15 - 41 U/L   ALT 43 17 - 63 U/L   Alkaline Phosphatase 114 38 - 126 U/L   Total Bilirubin 0.9 0.3 - 1.2 mg/dL   GFR calc non Af Amer >60 >60 mL/min   GFR calc Af Amer >60 >60 mL/min    Comment: (NOTE) The eGFR has been calculated using the CKD EPI equation. This calculation has not been validated in all clinical situations. eGFR's persistently <60 mL/min  signify possible Chronic Kidney Disease.    Anion gap 8 5 - 15  Magnesium     Status: None   Collection Time: 08/29/17  3:16 AM  Result Value Ref Range   Magnesium 1.7 1.7 - 2.4 mg/dL  Phosphorus     Status: Abnormal   Collection Time: 08/29/17  3:16 AM  Result Value Ref Range   Phosphorus 1.8 (L) 2.5 - 4.6 mg/dL  CBC with Differential/Platelet     Status: Abnormal   Collection Time: 08/29/17  3:16 AM  Result Value Ref Range   WBC 8.2 4.0 - 10.5 K/uL   RBC 3.68 (L) 4.22 - 5.81 MIL/uL   Hemoglobin 9.4 (L) 13.0 - 17.0 g/dL   HCT 27.4 (  L) 39.0 - 52.0 %   MCV 74.5 (L) 78.0 - 100.0 fL   MCH 25.5 (L) 26.0 - 34.0 pg   MCHC 34.3 30.0 - 36.0 g/dL   RDW 19.2 (H) 11.5 - 15.5 %   Platelets 85 (L) 150 - 400 K/uL    Comment: CONSISTENT WITH PREVIOUS RESULT   Neutrophils Relative % 79 %   Lymphocytes Relative 13 %   Monocytes Relative 8 %   Eosinophils Relative 0 %   Basophils Relative 0 %   Neutro Abs 6.4 1.7 - 7.7 K/uL   Lymphs Abs 1.1 0.7 - 4.0 K/uL   Monocytes Absolute 0.7 0.1 - 1.0 K/uL   Eosinophils Absolute 0.0 0.0 - 0.7 K/uL   Basophils Absolute 0.0 0.0 - 0.1 K/uL   WBC Morphology      MODERATE LEFT SHIFT (>5% METAS AND MYELOS,OCC PRO NOTED)    Comment: TOXIC GRANULATION  Comprehensive metabolic panel     Status: Abnormal   Collection Time: 08/30/17  4:26 AM  Result Value Ref Range   Sodium 135 135 - 145 mmol/L   Potassium 3.4 (L) 3.5 - 5.1 mmol/L   Chloride 104 101 - 111 mmol/L   CO2 23 22 - 32 mmol/L   Glucose, Bld 109 (H) 65 - 99 mg/dL   BUN <5 (L) 6 - 20 mg/dL   Creatinine, Ser <0.30 (L) 0.61 - 1.24 mg/dL   Calcium 6.2 (LL) 8.9 - 10.3 mg/dL    Comment: CRITICAL RESULT CALLED TO, READ BACK BY AND VERIFIED WITH: N DUMAS,RN [id number] @ 0535 BY J SCOTTON    Total Protein 5.0 (L) 6.5 - 8.1 g/dL   Albumin 2.3 (L) 3.5 - 5.0 g/dL   AST 15 15 - 41 U/L   ALT 32 17 - 63 U/L   Alkaline Phosphatase 106 38 - 126 U/L   Total Bilirubin 1.2 0.3 - 1.2 mg/dL   GFR calc non Af Amer  NOT CALCULATED >60 mL/min   GFR calc Af Amer NOT CALCULATED >60 mL/min    Comment: (NOTE) The eGFR has been calculated using the CKD EPI equation. This calculation has not been validated in all clinical situations. eGFR's persistently <60 mL/min signify possible Chronic Kidney Disease.    Anion gap 8 5 - 15  Magnesium     Status: Abnormal   Collection Time: 08/30/17  4:26 AM  Result Value Ref Range   Magnesium 1.6 (L) 1.7 - 2.4 mg/dL  Phosphorus     Status: Abnormal   Collection Time: 08/30/17  4:26 AM  Result Value Ref Range   Phosphorus 1.7 (L) 2.5 - 4.6 mg/dL  CBC with Differential/Platelet     Status: Abnormal   Collection Time: 08/30/17  4:26 AM  Result Value Ref Range   WBC 6.2 4.0 - 10.5 K/uL   RBC 3.54 (L) 4.22 - 5.81 MIL/uL   Hemoglobin 9.0 (L) 13.0 - 17.0 g/dL   HCT 26.5 (L) 39.0 - 52.0 %   MCV 74.9 (L) 78.0 - 100.0 fL   MCH 25.4 (L) 26.0 - 34.0 pg   MCHC 34.0 30.0 - 36.0 g/dL   RDW 19.5 (H) 11.5 - 15.5 %   Platelets 120 (L) 150 - 400 K/uL   Neutrophils Relative % 82 %   Lymphocytes Relative 11 %   Monocytes Relative 7 %   Eosinophils Relative 0 %   Basophils Relative 0 %   Neutro Abs 5.1 1.7 - 7.7 K/uL   Lymphs Abs 0.7 0.7 -  4.0 K/uL   Monocytes Absolute 0.4 0.1 - 1.0 K/uL   Eosinophils Absolute 0.0 0.0 - 0.7 K/uL   Basophils Absolute 0.0 0.0 - 0.1 K/uL   RBC Morphology POLYCHROMASIA PRESENT    WBC Morphology      MODERATE LEFT SHIFT (>5% METAS AND MYELOS,OCC PRO NOTED)    Comment: TOXIC GRANULATION    Current Facility-Administered Medications  Medication Dose Route Frequency Provider Last Rate Last Dose  . acetaminophen (TYLENOL) tablet 650 mg  650 mg Oral Q6H PRN Patrecia Pour, Christean Grief, MD       Or  . acetaminophen (TYLENOL) suppository 650 mg  650 mg Rectal Q6H PRN Patrecia Pour, Christean Grief, MD      . ampicillin (OMNIPEN) 2 g in sodium chloride 0.9 % 50 mL IVPB  2 g Intravenous Q4H Elodia Florence., MD   Stopped at 08/30/17 1548  . chlorhexidine (PERIDEX)  0.12 % solution 15 mL  15 mL Mouth Rinse BID Chesley Mires, MD   15 mL at 08/28/17 1017  . Chlorhexidine Gluconate Cloth 2 % PADS 6 each  6 each Topical Daily Chesley Mires, MD   6 each at 08/30/17 0443  . [START ON 08/31/2017] cosyntropin (CORTROSYN) injection 0.25 mg  0.25 mg Intravenous Once Perlov, Mikhail G, MD      . dextrose 5 %-0.9 % sodium chloride infusion   Intravenous Continuous Elodia Florence., MD 75 mL/hr at 08/29/17 2041    . dronabinol (MARINOL) capsule 5 mg  5 mg Oral QAC lunch Elodia Florence., MD   5 mg at 08/30/17 1207  . feeding supplement (ENSURE ENLIVE) (ENSURE ENLIVE) liquid 237 mL  237 mL Oral BID BM Elodia Florence., MD      . fentaNYL (SUBLIMAZE) injection 25-50 mcg  25-50 mcg Intravenous Q2H PRN Rigoberto Noel, MD   50 mcg at 08/29/17 2125  . MEDLINE mouth rinse  15 mL Mouth Rinse q12n4p Chesley Mires, MD   15 mL at 08/26/17 1650  . OLANZapine zydis (ZYPREXA) disintegrating tablet 2.5 mg  2.5 mg Oral QHS Loistine Chance, MD   2.5 mg at 08/29/17 2125  . ondansetron (ZOFRAN) injection 4 mg  4 mg Intravenous Q6H PRN Omar Person, NP      . oxyCODONE (Oxy IR/ROXICODONE) immediate release tablet 5 mg  5 mg Oral Q4H PRN Patrecia Pour, Christean Grief, MD   5 mg at 08/27/17 1949  . pantoprazole (PROTONIX) EC tablet 40 mg  40 mg Oral Daily Chesley Mires, MD   40 mg at 08/30/17 1024  . potassium chloride 20 MEQ/15ML (10%) solution 40 mEq  40 mEq Oral BID Theodis Blaze, MD   40 mEq at 08/30/17 1207  . potassium PHOSPHATE 30 mmol in dextrose 5 % 500 mL infusion  30 mmol Intravenous Once Theodis Blaze, MD 85 mL/hr at 08/30/17 1528 30 mmol at 08/30/17 1528  . sodium chloride flush (NS) 0.9 % injection 10-40 mL  10-40 mL Intracatheter Q12H Chesley Mires, MD   10 mL at 08/28/17 1019  . sodium chloride flush (NS) 0.9 % injection 10-40 mL  10-40 mL Intracatheter PRN Chesley Mires, MD   10 mL at 08/30/17 0427  . sodium chloride flush (NS) 0.9 % injection 5 mL  5 mL Intravenous Lovena Neighbours, MD   5 mL at 08/29/17 2312  . thiamine (VITAMIN B-1) tablet 100 mg  100 mg Oral Daily Wofford, Drew A, RPH   100 mg at  08/30/17 1024   Or  . thiamine (B-1) injection 100 mg  100 mg Intravenous Daily Zimir, Kittleson, RPH   100 mg at 08/29/17 1057   Facility-Administered Medications Ordered in Other Encounters  Medication Dose Route Frequency Provider Last Rate Last Dose  . sodium chloride flush (NS) 0.9 % injection 10 mL  10 mL Intracatheter PRN Ardath Sax, MD   10 mL at 08/09/17 1702    Musculoskeletal: Strength & Muscle Tone: decreased Gait & Station: unsteady Patient leans: N/A  Psychiatric Specialty Exam: Physical Exam  Nursing note and vitals reviewed. Constitutional: He is oriented to person, place, and time. He appears well-developed.  Cardiovascular: Normal rate.   Respiratory: Effort normal.  Musculoskeletal: Normal range of motion.  Neurological: He is alert and oriented to person, place, and time.  Skin: Skin is warm.    Review of Systems  Constitutional: Negative.   HENT: Negative.   Eyes: Negative.   Respiratory: Negative.   Cardiovascular: Negative.   Gastrointestinal: Negative.   Skin: Negative.   Neurological: Negative.   Endo/Heme/Allergies: Negative.     Blood pressure 114/64, pulse 97, temperature 98.7 F (37.1 C), temperature source Axillary, resp. rate 18, height 6' 2"  (1.88 m), weight 76.6 kg (168 lb 14 oz), SpO2 95 %.Body mass index is 21.68 kg/m.  General Appearance: Casual  Eye Contact:  Good  Speech:  Clear and Coherent and Normal Rate  Volume:  Decreased  Mood:  Depressed  Affect:  Flat  Thought Process:  Coherent and Descriptions of Associations: Intact  Orientation:  Full (Time, Place, and Person)  Thought Content:  WDL  Suicidal Thoughts:  No  Homicidal Thoughts:  No  Memory:  Immediate;   Good Recent;   Good  Judgement:  Good  Insight:  Good  Psychomotor Activity:  Normal  Concentration:  Concentration: Good and  Attention Span: Good  Recall:  Good  Fund of Knowledge:  Good  Language:  Good  Akathisia:  No  Handed:  Right  AIMS (if indicated):     Assets:  Financial Resources/Insurance Housing Social Support Transportation  ADL's:  Intact  Cognition:  WNL  Sleep:        Treatment Plan Summary: Medication management  - Recommend continuing Zyprexa 2.5 mg PO QHS for depression and appetite -Encourage 1:1 counseling -Establish outpatient psychiatry with counseling or therapist  -Cleared from psychiatry - re-consult if needed  Disposition:  No evidence of imminent risk to self or others at present.   Patient does not meet criteria for psychiatric inpatient admission. Supportive therapy provided about ongoing stressors. Discussed crisis plan, support from social network, calling 911, coming to the Emergency Department, and calling Suicide Hotline.  [place], [name] 08/30/2017 8:28 PM

## 2017-08-30 NOTE — Progress Notes (Signed)
CSW updated pt. Brother POA. He request CSW provide his Home contact [phone]

## 2017-08-30 NOTE — Progress Notes (Signed)
CRITICAL VALUE ALERT  Critical Value:  Ca 6.2  Date & Time Notied:  9/27 0536  Provider Notified: Schorr  Orders Received/Actions taken:

## 2017-08-31 ENCOUNTER — Ambulatory Visit (HOSPITAL_COMMUNITY)
Admission: RE | Admit: 2017-08-31 | Discharge: 2017-08-31 | Disposition: A | Discharge status: Home / Self Care | Payer: Medicaid Other | Source: Ambulatory Visit | Attending: Hematology and Oncology | Admitting: Hematology and Oncology

## 2017-08-31 ENCOUNTER — Ambulatory Visit (HOSPITAL_COMMUNITY): Payer: Medicaid Other

## 2017-08-31 DIAGNOSIS — C8338 Diffuse large B-cell lymphoma, lymph nodes of multiple sites: Secondary | ICD-10-CM

## 2017-08-31 DIAGNOSIS — N201 Calculus of ureter: Secondary | ICD-10-CM

## 2017-08-31 DIAGNOSIS — F4323 Adjustment disorder with mixed anxiety and depressed mood: Secondary | ICD-10-CM

## 2017-08-31 LAB — CULTURE, BLOOD (ROUTINE X 2)
CULTURE: NO GROWTH
CULTURE: NO GROWTH
SPECIAL REQUESTS: ADEQUATE

## 2017-08-31 LAB — PHOSPHORUS: PHOSPHORUS: 2 mg/dL — AB (ref 2.5–4.6)

## 2017-08-31 LAB — CBC
HEMATOCRIT: 27 % — AB (ref 39.0–52.0)
Hemoglobin: 9.2 g/dL — ABNORMAL LOW (ref 13.0–17.0)
MCH: 25.5 pg — ABNORMAL LOW (ref 26.0–34.0)
MCHC: 34.1 g/dL (ref 30.0–36.0)
MCV: 74.8 fL — AB (ref 78.0–100.0)
Platelets: 177 10*3/uL (ref 150–400)
RBC: 3.61 MIL/uL — AB (ref 4.22–5.81)
RDW: 19.7 % — AB (ref 11.5–15.5)
WBC: 4.7 10*3/uL (ref 4.0–10.5)

## 2017-08-31 LAB — ACTH STIMULATION, 3 TIME POINTS
CORTISOL 60 MIN: 17.2 ug/dL
Cortisol, 30 Min: 16.9 ug/dL
Cortisol, Base: 17.4 ug/dL

## 2017-08-31 LAB — CORTISOL: CORTISOL PLASMA: 32.6 ug/dL

## 2017-08-31 LAB — BASIC METABOLIC PANEL
ANION GAP: 7 (ref 5–15)
BUN: <5 mg/dL — ABNORMAL LOW (ref 6–20)
CALCIUM: 6.3 mg/dL — AB (ref 8.9–10.3)
CO2: 23 mmol/L (ref 22–32)
Chloride: 105 mmol/L (ref 101–111)
Creatinine, Ser: <0.3 mg/dL — ABNORMAL LOW (ref 0.61–1.24)
GLUCOSE: 111 mg/dL — AB (ref 65–99)
POTASSIUM: 3.6 mmol/L (ref 3.5–5.1)
Sodium: 135 mmol/L (ref 135–145)

## 2017-08-31 LAB — MAGNESIUM: Magnesium: 1.9 mg/dL (ref 1.7–2.4)

## 2017-08-31 MED ORDER — CALCIUM GLUCONATE 10 % IV SOLN
2.0000 g | Freq: Once | INTRAVENOUS | Status: AC
  Administered 2017-08-31: 2 g via INTRAVENOUS
  Filled 2017-08-31: qty 20

## 2017-08-31 MED ORDER — AMOXICILLIN-POT CLAVULANATE 875-125 MG PO TABS
1.0000 | ORAL_TABLET | Freq: Two times a day (BID) | ORAL | Status: DC
  Administered 2017-08-31 – 2017-09-02 (×2): 1 via ORAL
  Filled 2017-08-31 (×4): qty 1

## 2017-08-31 MED ORDER — SODIUM CHLORIDE 0.9 % IV SOLN
2.0000 g | Freq: Once | INTRAVENOUS | Status: AC
  Administered 2017-08-31: 2 g via INTRAVENOUS
  Filled 2017-08-31: qty 20

## 2017-08-31 MED ORDER — POTASSIUM PHOSPHATES 15 MMOLE/5ML IV SOLN
30.0000 mmol | Freq: Once | INTRAVENOUS | Status: AC
Start: 2017-08-31 — End: 2017-09-01
  Administered 2017-08-31: 30 mmol via INTRAVENOUS
  Filled 2017-08-31: qty 10

## 2017-08-31 NOTE — Progress Notes (Signed)
PROGRESS NOTE  Cory Ibarra  LTJ:[id number] DOB: 06/19/1961 DOA: 08/21/2017   PCP: [name], [name]   Brief Narrative: 56 yo male presented with fever, n/v, hematuria, diarrhea.  Found to have multiple renal calculi with Rt ureteral calculus.  Required pressors in setting of septic shock and PCCM asked to assess.  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  He was admitted for febrile neutropenia and septic shock secondary to an obstructing right ureteral stone. Right-sided retrograde ureteral stent placement was unsuccessful and IR placed a right-sided percutaneous nephric nephrostomy catheter. He's grown out Escherichia coli bacteremia which is pansensitive.  Cory Ibarra is currently hemodynamically stable, but has not participated actively in his care since I've been taking care of him. He has not been eating.  He has significant electrolyte abnormalities daily.  We discussed an NG tube today until he gets his appetite back which he declined.  Palliative care consult has been placed on 9/25.  Assessment & Plan: Sepsis secondary to e. Coli bacteremia  Obstructed R ureteral Stone:   - Now s/p perc nephrostomy.  Pressors now d/c'd. - s/p perc nephrostomy tube (urology unable to place retrograde stend due to severe impaction)   - ampicillin 2 q 4 (9/25 - ) ... ceftriaxone 2 grams daily (9/19 - 9/25) (vanc 9/18-9/19; zosyn 9/18-9/19) - ucx multiple species, blood cx x2 e. Coli, pan sensitive  - off neuopgen - has been weaned off hydrocortisone - blood cultures repeated and NGTD - de escalate antibiotics to augmentin for 3 more days to complete 14 day course - follow up with urology for further management of stone  Orthostatic hypotension - cosyntropin negative, baseline cortisol was elevated -likely related to volume depletion -recheck orthostatics  Pancytopenia  Neutropenia - blood counts overall stable   Diffuse Large  B Cell Lymphoma: dx in July 2018, last chemo 08/09/17 - chemo with [name] - outpatient follow up   Hypophosphatemia  Hypocalcemia  Hypomagnesemia  Hypokalemia:  - all electrolytes still low and will need to cont to supplement  - check electrolytes in am   Elevated alk phos and ALT: continue to monitor, have been elevated in past. - viral hep studies from 8/13 notable for + Hep B core Ab and surface antibody, but negative surface antigen and negative HBV PCR  - CMET in AM  Elevated troponin: on presentation, 2/2 demand ischemia, no chest pain this AM   Diarrhea: improved, GI path panel on admission negative  Malnutrition - encouraged oral intake  Concern for depression - seen by psychiatry who agreed with zyprexa and recommended outpatient follow up  DVT prophylaxis: SCD Code Status: Partial, no CPR Family Communication: pt at bedside  Disposition Plan: discharge to SNF, likely in AM  Consultants:   PCCM, Urology, IR, Oncology  Psych  Procedures:   Perc nephrostomy tube placement  R cystoscopy with retrograde pyelogram, unsuccessful stent attempt  Antimicrobials:  Anti-infectives    Start     Dose/Rate Route Frequency Ordered Stop   08/29/17 0800  ampicillin (OMNIPEN) 2 g in sodium chloride 0.9 % 50 mL IVPB     2 g 150 mL/hr over 20 Minutes Intravenous Every 4 hours 08/28/17 1535     08/22/17 1000  cefTRIAXone (ROCEPHIN) 2 g in dextrose 5 % 50 mL IVPB  Status:  Discontinued     2 g 100 mL/hr over 30 Minutes Intravenous Every 24 hours 08/22/17 0836 08/28/17 1535  08/22/17 0000  vancomycin (VANCOCIN) IVPB 750 mg/150 ml premix  Status:  Discontinued     750 mg 150 mL/hr over 60 Minutes Intravenous Every 8 hours 08/21/17 1858 08/22/17 0836   08/21/17 2200  piperacillin-tazobactam (ZOSYN) IVPB 3.375 g  Status:  Discontinued     3.375 g 12.5 mL/hr over 240 Minutes Intravenous Every 8 hours 08/21/17 1858 08/22/17 0836   08/21/17 1530  vancomycin (VANCOCIN) 1,500 mg in  sodium chloride 0.9 % 500 mL IVPB     1,500 mg 250 mL/hr over 120 Minutes Intravenous  Once 08/21/17 1525 08/21/17 1745   08/21/17 1500  piperacillin-tazobactam (ZOSYN) IVPB 3.375 g     3.375 g 100 mL/hr over 30 Minutes Intravenous  Once 08/21/17 1451 08/21/17 1550   08/21/17 1500  vancomycin (VANCOCIN) IVPB 1000 mg/200 mL premix  Status:  Discontinued     1,000 mg 200 mL/hr over 60 Minutes Intravenous  Once 08/21/17 1451 08/21/17 1525      Subjective: No new complaints. No vomiting. No abdominal pain  Objective: Vitals:   08/30/17 1548 08/30/17 2116 08/31/17 0511 08/31/17 1454  BP: 114/64 108/63 111/70 112/66  Pulse: 97 93 95 92  Resp:  _0 Temp: 98.7 F (37.1 C) 98.1 F (36.7 C) 98.2 F (36.8 C) 97.8 F (36.6 C)  TempSrc: Axillary Oral Oral Oral  SpO2: 95% 97% 98% 97%  Weight:      Height:        Intake/Output Summary (Last 24 hours) at 08/31/17 1628 Last data filed at 08/31/17 1555  Gross per 24 hour  Intake              985 ml  Output             2650 ml  Net            -1665 ml   Filed Weights   08/21/17 1216 08/21/17 1441 08/21/17 1643  Weight: 81.6 kg (180 lb) 81.6 kg (180 lb) 76.6 kg (168 lb 14 oz)    Physical Exam  Constitutional: Appears calm, NAD CVS: RRR, S1/S2 +, no murmurs, no gallops, no carotid bruit.  Pulmonary: Effort and breath sounds normal, no stridor, rhonchi, wheezes, rales.  Abdominal: Soft. BS +,  no distension, tenderness, rebound or guarding.  Musculoskeletal: Normal range of motion. No edema and no tenderness.   Data Reviewed: I have personally reviewed following labs and imaging studies  CBC:  Recent Labs Lab 08/26/17 0322 08/27/17 0348 08/28/17 0525 08/29/17 0316 08/30/17 0426 08/31/17 0439  WBC 20.0* 13.4* 10.1 8.2 6.2 4.7  NEUTROABS 18.8* 11.6* 8.2* 6.4 5.1  --   HGB 8.7* 8.7* 9.6* 9.4* 9.0* 9.2*  HCT 25.6* 25.2* 28.3* 27.4* 26.5* 27.0*  MCV 74.9* 73.5* 75.1* 74.5* 74.9* 74.8*  PLT 66* 63* 79* 85* 120* 644    Basic Metabolic Panel:  Recent Labs Lab 08/27/17 0348 08/28/17 0525 08/29/17 0316 08/30/17 0426 08/31/17 0439  NA 140 137 134* 135 135  K 3.0* 3.8 3.1* 3.4* 3.6  CL 105 103 102 104 105  CO2 _1 GLUCOSE 92 86 108* 109* 111*  BUN 7 6 <5* <5* <5*  CREATININE 0.31* 0.33* 0.30* <0.30* <0.30*  CALCIUM 6.3* 6.4* 6.3* 6.2* 6.3*  MG 1.9 1.7 1.7 1.6* 1.9  PHOS 1.8* 2.0* 1.8* 1.7* 2.0*   Liver Function Tests:  Recent Labs Lab 08/26/17 0322 08/27/17 0348 08/28/17 0525 08/29/17 0316 08/30/17 0426  AST 28 20 20  17 15  ALT 107* 73* 57 43 32  ALKPHOS 138* 126 122 114 106  BILITOT 1.3* 1.2 1.4* 0.9 1.2  PROT 4.7* 4.6* 5.2* 5.2* 5.0*  ALBUMIN 2.3* 2.3* 2.5* 2.5* 2.3*   Recent Results (from the past 240 hour(s))  Gastrointestinal Panel by PCR , Stool     Status: None   Collection Time: 08/21/17  6:25 PM  Result Value Ref Range Status   Campylobacter species NOT DETECTED NOT DETECTED Final   Plesimonas shigelloides NOT DETECTED NOT DETECTED Final   Salmonella species NOT DETECTED NOT DETECTED Final   Yersinia enterocolitica NOT DETECTED NOT DETECTED Final   Vibrio species NOT DETECTED NOT DETECTED Final   Vibrio cholerae NOT DETECTED NOT DETECTED Final   Enteroaggregative E coli (EAEC) NOT DETECTED NOT DETECTED Final   Enteropathogenic E coli (EPEC) NOT DETECTED NOT DETECTED Final   Enterotoxigenic E coli (ETEC) NOT DETECTED NOT DETECTED Final   Shiga like toxin producing E coli (STEC) NOT DETECTED NOT DETECTED Final   Shigella/Enteroinvasive E coli (EIEC) NOT DETECTED NOT DETECTED Final   Cryptosporidium NOT DETECTED NOT DETECTED Final   Cyclospora cayetanensis NOT DETECTED NOT DETECTED Final   Entamoeba histolytica NOT DETECTED NOT DETECTED Final   Giardia lamblia NOT DETECTED NOT DETECTED Final   Adenovirus F40/41 NOT DETECTED NOT DETECTED Final   Astrovirus NOT DETECTED NOT DETECTED Final   Norovirus GI/GII NOT DETECTED NOT DETECTED Final   Rotavirus A NOT  DETECTED NOT DETECTED Final   Sapovirus (I, II, IV, and V) NOT DETECTED NOT DETECTED Final  Urine culture     Status: None   Collection Time: 08/21/17 11:15 PM  Result Value Ref Range Status   Specimen Description URINE, SUPRAPUBIC RIGHT KIDNEY  Final   Special Requests Immunocompromised  Final   Culture   Final    NO GROWTH Performed at [facility], [address] [place], [place] [address]    Report Status 08/23/2017 FINAL  Final  Culture, blood (routine x 2)     Status: None   Collection Time: 08/25/17  4:43 PM  Result Value Ref Range Status   Specimen Description BLOOD PORTA CATH  Final   Special Requests   Final    BOTTLES DRAWN AEROBIC AND ANAEROBIC Blood Culture adequate volume   Culture   Final    NO GROWTH 5 DAYS Performed at [facility], [place] [address], [place], [place] [address]    Report Status 08/31/2017 FINAL  Final  Culture, blood (routine x 2)     Status: None   Collection Time: 08/25/17  4:44 PM  Result Value Ref Range Status   Specimen Description BLOOD BLOOD LEFT HAND  Final   Special Requests   Final    BOTTLES DRAWN AEROBIC AND ANAEROBIC Blood Culture results may not be optimal due to an excessive volume of blood received in culture bottles   Culture   Final    NO GROWTH 5 DAYS Performed at [facility], [place] [address], [place], [place] [address]    Report Status 08/31/2017 FINAL  Final    Radiology Studies: No results found.   Scheduled Meds: . chlorhexidine  15 mL Mouth Rinse BID  . Chlorhexidine Gluconate Cloth  6 each Topical Daily  . dronabinol  5 mg Oral QAC lunch  . feeding supplement (ENSURE ENLIVE)  237 mL Oral BID BM  . mouth rinse  15 mL Mouth Rinse q12n4p  . OLANZapine zydis  2.5 mg Oral QHS  . pantoprazole  40 mg Oral Daily  . potassium chloride  40 mEq Oral BID  . sodium chloride flush  10-40 mL Intracatheter Q12H  . sodium chloride flush  5 mL Intravenous Q8H  . thiamine  100 mg Oral Daily   Or  . thiamine  injection  100 mg Intravenous Daily   Continuous Infusions: . ampicillin (OMNIPEN) IV Stopped (08/31/17 1233)  . dextrose 5 % and 0.9% NaCl 75 mL/hr at 08/29/17 2041     LOS: 10 days    Time spent: 25 minutes   Kelbie Moro, MD [facility] [phone]   If 7PM-7AM, please contact night-coverage [email or web address] Password [facility] 08/31/2017, 4:28 PM

## 2017-08-31 NOTE — Progress Notes (Signed)
PT Cancellation Note  Patient Details Name: Cory Ibarra MRN: [id number] DOB: 1961-08-05   Cancelled Treatment:     pt awake in bed, flat, dark room.  Pt declined any offer to get OOB despite max encouragement.  No eye contact.  Staring at ceiling.  TV off.  Pt has been evaluated with rec for SNF.    Rica Koyanagi  PTA [facility]  Acute  Rehab Pager      [phone]

## 2017-08-31 NOTE — Care Management Note (Signed)
Case Management Note  Patient Details  Name: Cory Ibarra MRN: [id number] Date of Birth: [birth year]  Subjective/Objective:                  Sepsis secondary to e. Coli bacteremia  Obstructed R ureteral Stone:    Action/Plan: Date:  August 31, 2017 Chart reviewed for concurrent status and case management needs.  Will continue to follow patient progress.  Discharge Planning: following for needs  Expected discharge date: September 03, 2017  Velva Harman, BSN, [place], [place]   Expected Discharge Date:   (unknown)               Expected Discharge Plan:  Home/Self Care  In-House Referral:     Discharge planning Services  CM Consult  Post Acute Care Choice:    Choice offered to:     DME Arranged:    DME Agency:     HH Arranged:    HH Agency:     Status of Service:  In process, will continue to follow  If discussed at Long Length of Stay Meetings, dates discussed:    Additional Comments:  Leeroy Cha, RN 08/31/2017, 9:42 AM

## 2017-08-31 NOTE — Progress Notes (Signed)
Initial Nutrition Assessment  DOCUMENTATION CODES:   Severe malnutrition in context of chronic illness  INTERVENTION:    Ensure Enlive po BID, each supplement provides 350 kcal and 20 grams of protein  Magic cup TID with meals, each supplement provides 290 kcal and 9 grams of protein  Obtain weekly weights  Monitor for palliative goals of care  Pt is at high risk for refeeding. Monitor and supplement electrolytes as need per MD descretion.   NUTRITION DIAGNOSIS:   Malnutrition (Severe) related to cancer and cancer related treatments, chronic illness as evidenced by energy intake < 75% for > 7 days, 17% weight loss in 2 months.  Ongoing  GOAL:   Patient will meet greater than or equal to 90% of their needs  Not Meeting  MONITOR:   PO intake, Supplement acceptance, Weight trends, Labs  REASON FOR ASSESSMENT:   Consult Assessment of nutrition requirement/status  ASSESSMENT:   Pt with PMH significant for diffuse large B-cell lymphoma (currently on chemotherapy, last treatment two weeks ago). Presents this admission with new onset diarrhea without melena and febrile neutropenia.   48 hour calorie count ordered. Day 2: 08/30/17 Pt consumed 0% of every meal and supplement.   Pt continues to refuse anything PO. Spoke with pt yesterday. Pt more alert reporting he will only eat a "[employer] biscuit." MD notes 9/27: " We discussed an NG tube today until he gets his appetite back which he declined." Pt has not consumed >75% of estimated energy requirement for > 7 days. Given his lack of PO intake this hospital stay and percent wt loss, pt meets criteria for severe malnutrition. Would recommend different route of nutrition support if poor PO intake persists as pt has not had adequate nutrition for 10 days (if pt is willing). Palliative consulted. RD to monitor for plan of care. No weights have been obtained since beginning of admission. Would recommend getting weekly weights to  monitor wt trends. Pt remains high risk for refeeding. Monitor and supplement electrolytes as need per MD descretion.  Medications reviewed and include: IV abx, D5 75 ml/hr Labs reviewed: BUN <5 (L) Creatinine <0.30 (L) Phos 2.0 (L) Albumin 2.3 (L)    Diet Order:  Diet regular Room service appropriate? Yes; Fluid consistency: Thin  Skin:  Reviewed, no issues  Last BM:  08/24/17  Height:   Ht Readings from Last 1 Encounters:  08/21/17 6\' 2"  (1.88 m)    Weight:   Wt Readings from Last 1 Encounters:  08/21/17 168 lb 14 oz (76.6 kg)    Ideal Body Weight:  86.3 kg  BMI:  Body mass index is 21.68 kg/m.  Estimated Nutritional Needs:   Kcal:  2300-2500 (30-33 kcal/kg)  Protein:  125-135 grams (1.6-1.8 g/kg)  Fluid:  >2.3 L/day  EDUCATION NEEDS:   No education needs identified at this time  [place], LDN Clinical Nutrition Pager # - [phone]

## 2017-08-31 NOTE — Progress Notes (Signed)
Occupational Therapy Treatment Patient Details Name: Cory Ibarra MRN: [id number] DOB: 02-09-1961 Today's Date: 08/31/2017    History of present illness 56 yo male presented with fever, n/v, hematuria, diarrhea.  Found to have multiple renal calculi with Rt ureteral calculus.  Required pressors in setting of septic shock and PCCM asked to assess.  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  Pt declined all activity other than BUE Exercise           Precautions / Restrictions Precautions Precautions: Fall Restrictions Weight Bearing Restrictions: (P) No       Mobility Bed Mobility               General bed mobility comments: refused  Transfers                 General transfer comment: refused        ADL either performed or assessed with clinical judgement   ADL                                         General ADL Comments: pt refused OOB and any ADL activity but did agree to BUE- goal added               Cognition Arousal/Alertness: Awake/alert (but eyes closed part of session) Behavior During Therapy: Flat affect Overall Cognitive Status: Within Functional Limits for tasks assessed                                          Exercises General Exercises - Upper Extremity Shoulder Flexion: AROM;Supine;20 reps Shoulder ABduction: AROM;Supine;20 reps Elbow Flexion: AROM;Supine Elbow Extension: AROM;20 reps Wrist Flexion: AROM;20 reps;Supine Wrist Extension: AROM;Supine;20 reps   Shoulder Instructions       General Comments      Pertinent Vitals/ Pain       Pain Assessment: No/denies pain  Home Living                                          Prior Functioning/Environment              Frequency           Progress Toward Goals  OT Goals(current goals can now be found in the care plan  section)  Progress towards OT goals: OT to reassess next treatment  ADL Goals Pt/caregiver will Perform Home Exercise Program: Increased strength;Both right and left upper extremity;With written HEP provided  Plan Discharge plan remains appropriate       AM-PAC PT "6 Clicks" Daily Activity     Outcome Measure   Help from another person eating meals?: A Little Help from another person taking care of personal grooming?: A Little Help from another person toileting, which includes using toliet, bedpan, or urinal?: Total Help from another person bathing (including washing, rinsing, drying)?: Total Help from another person to put on and taking off regular upper body clothing?: Total Help from another person to put on and taking off regular lower body clothing?: Total 6 Click Score: 10    End of Session  Activity Tolerance Other (comment) (pt self limiting)   Patient Left             Time: 8676-7209 OT Time Calculation (min): 11 min  Charges: OT General Charges $OT Visit: 1 Visit OT Treatments $Therapeutic Exercise: 8-22 mins  [place], [place]   Betsy Pries 08/31/2017, 11:53 AM

## 2017-09-01 LAB — BASIC METABOLIC PANEL
ANION GAP: 8 (ref 5–15)
BUN: 6 mg/dL (ref 6–20)
CALCIUM: 6.8 mg/dL — AB (ref 8.9–10.3)
CO2: 23 mmol/L (ref 22–32)
Chloride: 104 mmol/L (ref 101–111)
Creatinine, Ser: <0.3 mg/dL — ABNORMAL LOW (ref 0.61–1.24)
Glucose, Bld: 100 mg/dL — ABNORMAL HIGH (ref 65–99)
POTASSIUM: 3.6 mmol/L (ref 3.5–5.1)
Sodium: 135 mmol/L (ref 135–145)

## 2017-09-01 LAB — PHOSPHORUS: Phosphorus: 2.4 mg/dL — ABNORMAL LOW (ref 2.5–4.6)

## 2017-09-01 NOTE — Progress Notes (Signed)
PROGRESS NOTE  Cory Ibarra  GPQ:[id number] DOB: 1961-07-31 DOA: 08/21/2017   PCP: [name], [name]   Brief Narrative: 56 yo male presented with fever, n/v, hematuria, diarrhea.  Found to have multiple renal calculi with Rt ureteral calculus.  Required pressors in setting of septic shock and PCCM asked to assess.  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  He was admitted for febrile neutropenia and septic shock secondary to an obstructing right ureteral stone. Right-sided retrograde ureteral stent placement was unsuccessful and IR placed a right-sided percutaneous nephric nephrostomy catheter. He's grown out Escherichia coli bacteremia which is pansensitive.  Cory Ibarra is currently hemodynamically stable, but has not participated actively in his care since I've been taking care of him. He has not been eating.  He has significant electrolyte abnormalities daily.  We discussed an NG tube today until he gets his appetite back which he declined.  Palliative care consult has been placed on 9/25.  Assessment & Plan: Sepsis secondary to e. Coli bacteremia  Obstructed R ureteral Stone:   - Now s/p perc nephrostomy.  Pressors now d/c'd. - s/p perc nephrostomy tube (urology unable to place retrograde stend due to severe impaction)   - ampicillin 2 q 4 (9/25 - ) ... ceftriaxone 2 grams daily (9/19 - 9/25) (vanc 9/18-9/19; zosyn 9/18-9/19) - ucx multiple species, blood cx x2 e. 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, last chemo 08/09/17 - chemo with [name] - outpatient follow up   Hypophosphatemia  Hypocalcemia  Hypomagnesemia  Hypokalemia:  - K and Mg now WNL, phosp still low - repeat electrolytes in AM  Elevated alk phos and ALT - viral hep studies from 8/13 notable for + Hep B core Ab and surface antibody, but negative surface antigen and negative HBV PCR  - CMET in AM  Elevated troponin: on presentation, 2/2 demand ischemia - no chest pain this AM   Diarrhea: improved, GI path panel on admission negative  Malnutrition - severe PCM, nutritionist consulted   Concern for depression - seen by psychiatry who agreed with zyprexa and recommended outpatient follow up  DVT prophylaxis: SCD Code Status: Partial, no CPR Family Communication: pt at bedside  Disposition Plan: discharge to SNF when bed available   Consultants:   PCCM, Urology, IR, Oncology  Psych  Procedures:   Perc nephrostomy tube placement  R cystoscopy with retrograde pyelogram, unsuccessful stent attempt  Antimicrobials:  Anti-infectives    Start     Dose/Rate Route Frequency Ordered Stop   08/31/17 2200  amoxicillin-clavulanate (AUGMENTIN) 875-125 MG per tablet 1 tablet     1 tablet Oral Every 12 hours 08/31/17 1715 09/03/17 2159   08/29/17 0800  ampicillin (OMNIPEN) 2 g in sodium chloride 0.9 % 50 mL IVPB  Status:  Discontinued     2 g 150 mL/hr over 20 Minutes Intravenous Every 4 hours 08/28/17 1535 08/31/17 1715   08/22/17 1000  cefTRIAXone (ROCEPHIN) 2 g in dextrose 5 % 50  mL IVPB  Status:  Discontinued     2 g 100 mL/hr over 30 Minutes Intravenous Every 24 hours 08/22/17 0836 08/28/17 1535   08/22/17 0000  vancomycin (VANCOCIN) IVPB 750 mg/150 ml premix  Status:  Discontinued     750 mg 150 mL/hr over 60 Minutes Intravenous Every 8 hours 08/21/17 1858 08/22/17 0836   08/21/17 2200  piperacillin-tazobactam (ZOSYN) IVPB 3.375 g  Status:  Discontinued     3.375 g 12.5  mL/hr over 240 Minutes Intravenous Every 8 hours 08/21/17 1858 08/22/17 0836   08/21/17 1530  vancomycin (VANCOCIN) 1,500 mg in sodium chloride 0.9 % 500 mL IVPB     1,500 mg 250 mL/hr over 120 Minutes Intravenous  Once 08/21/17 1525 08/21/17 1745   08/21/17 1500  piperacillin-tazobactam (ZOSYN) IVPB 3.375 g     3.375 g 100 mL/hr over 30 Minutes Intravenous  Once 08/21/17 1451 08/21/17 1550   08/21/17 1500  vancomycin (VANCOCIN) IVPB 1000 mg/200 mL premix  Status:  Discontinued     1,000 mg 200 mL/hr over 60 Minutes Intravenous  Once 08/21/17 1451 08/21/17 1525      Subjective: No new concerns.   Objective: Vitals:   08/31/17 1454 08/31/17 2118 09/01/17 0521 09/01/17 1352  BP: 112/66 121/75 111/63 110/66  Pulse: 92 94 93 97  Resp: 18 18 18 18   Temp: 97.8 F (36.6 C) 98.1 F (36.7 C) (!) 97.5 F (36.4 C) 98.2 F (36.8 C)  TempSrc: Oral Oral Oral Oral  SpO2: 97% 96% 97% 97%  Weight:      Height:        Intake/Output Summary (Last 24 hours) at 09/01/17 1500 Last data filed at 09/01/17 1350  Gross per 24 hour  Intake             1000 ml  Output             2025 ml  Net            -1025 ml   Filed Weights   08/21/17 1216 08/21/17 1441 08/21/17 1643  Weight: 81.6 kg (180 lb) 81.6 kg (180 lb) 76.6 kg (168 lb 14 oz)   Physical Exam  Constitutional: Appears calm, tired, flat affect CVS: RRR, S1/S2 +, no murmurs, no gallops, no carotid bruit.  Pulmonary: Effort and breath sounds normal, no stridor, rhonchi, wheezes, rales.  Abdominal: Soft. BS +,  no distension, tenderness, rebound or guarding.   Data Reviewed: I have personally reviewed following labs and imaging studies  CBC:  Recent Labs Lab 08/26/17 0322 08/27/17 0348 08/28/17 0525 08/29/17 0316 08/30/17 0426 08/31/17 0439  WBC 20.0* 13.4* 10.1 8.2 6.2 4.7  NEUTROABS 18.8* 11.6* 8.2* 6.4 5.1  --   HGB 8.7* 8.7* 9.6* 9.4* 9.0* 9.2*  HCT 25.6* 25.2* 28.3* 27.4* 26.5* 27.0*  MCV 74.9* 73.5* 75.1* 74.5* 74.9*  74.8*  PLT 66* 63* 79* 85* 120* 500   Basic Metabolic Panel:  Recent Labs Lab 08/27/17 0348 08/28/17 0525 08/29/17 0316 08/30/17 0426 08/31/17 0439 09/01/17 0341  NA 140 137 134* 135 135 135  K 3.0* 3.8 3.1* 3.4* 3.6 3.6  CL 105 103 102 104 105 104  CO2 27 25 24 23 23 23   GLUCOSE 92 86 108* 109* 111* 100*  BUN 7 6 <5* <5* <5* 6  CREATININE 0.31* 0.33* 0.30* <0.30* <0.30* <0.30*  CALCIUM 6.3* 6.4* 6.3* 6.2* 6.3* 6.8*  MG 1.9 1.7 1.7 1.6* 1.9  --   PHOS 1.8* 2.0* 1.8*  1.7* 2.0* 2.4*   Liver Function Tests:  Recent Labs Lab 08/26/17 0322 08/27/17 0348 08/28/17 0525 08/29/17 0316 08/30/17 0426  AST 28 20 20 17 15   ALT 107* 73* 57 43 32  ALKPHOS 138* 126 122 114 106  BILITOT 1.3* 1.2 1.4* 0.9 1.2  PROT 4.7* 4.6* 5.2* 5.2* 5.0*  ALBUMIN 2.3* 2.3* 2.5* 2.5* 2.3*   Recent Results (from the past 240 hour(s))  Culture, blood (routine x 2)     Status: None   Collection Time: 08/25/17  4:43 PM  Result Value Ref Range Status   Specimen Description BLOOD PORTA CATH  Final   Special Requests   Final    BOTTLES DRAWN AEROBIC AND ANAEROBIC Blood Culture adequate volume   Culture   Final    NO GROWTH 5 DAYS Performed at [facility], [address] [place] [employer], [place] [address]    Report Status 08/31/2017 FINAL  Final  Culture, blood (routine x 2)     Status: None   Collection Time: 08/25/17  4:44 PM  Result Value Ref Range Status   Specimen Description BLOOD BLOOD LEFT HAND  Final   Special Requests   Final    BOTTLES DRAWN AEROBIC AND ANAEROBIC Blood Culture results may not be optimal due to an excessive volume of blood received in culture bottles   Culture   Final    NO GROWTH 5 DAYS Performed at [facility], [place] [address], [place], [place] [address]    Report Status 08/31/2017 FINAL  Final    Radiology Studies: No results found.   Scheduled Meds: . amoxicillin-clavulanate  1 tablet Oral Q12H  . chlorhexidine  15 mL Mouth Rinse BID  . Chlorhexidine  Gluconate Cloth  6 each Topical Daily  . dronabinol  5 mg Oral QAC lunch  . feeding supplement (ENSURE ENLIVE)  237 mL Oral BID BM  . mouth rinse  15 mL Mouth Rinse q12n4p  . OLANZapine zydis  2.5 mg Oral QHS  . pantoprazole  40 mg Oral Daily  . potassium chloride  40 mEq Oral BID  . sodium chloride flush  10-40 mL Intracatheter Q12H  . sodium chloride flush  5 mL Intravenous Q8H  . thiamine  100 mg Oral Daily   Or  . thiamine injection  100 mg Intravenous Daily   Continuous Infusions:    LOS: 11 days    Time spent: 25 minutes  Faye Ramsay, MD [facility] [phone]  If 7PM-7AM, please contact night-coverage [email or web address] Password [email or web address] 09/01/2017, 3:00 PM

## 2017-09-02 LAB — CBC
HCT: 29.9 % — ABNORMAL LOW (ref 39.0–52.0)
Hemoglobin: 9.9 g/dL — ABNORMAL LOW (ref 13.0–17.0)
MCH: 24.8 pg — ABNORMAL LOW (ref 26.0–34.0)
MCHC: 33.1 g/dL (ref 30.0–36.0)
MCV: 74.9 fL — AB (ref 78.0–100.0)
Platelets: 343 10*3/uL (ref 150–400)
RBC: 3.99 MIL/uL — ABNORMAL LOW (ref 4.22–5.81)
RDW: 19.6 % — AB (ref 11.5–15.5)
WBC: 5.7 10*3/uL (ref 4.0–10.5)

## 2017-09-02 LAB — BASIC METABOLIC PANEL
Anion gap: 10 (ref 5–15)
BUN: 8 mg/dL (ref 6–20)
CALCIUM: 7.1 mg/dL — AB (ref 8.9–10.3)
CO2: 22 mmol/L (ref 22–32)
Chloride: 104 mmol/L (ref 101–111)
GLUCOSE: 92 mg/dL (ref 65–99)
Potassium: 3.4 mmol/L — ABNORMAL LOW (ref 3.5–5.1)
Sodium: 136 mmol/L (ref 135–145)

## 2017-09-02 LAB — PHOSPHORUS: Phosphorus: 1.9 mg/dL — ABNORMAL LOW (ref 2.5–4.6)

## 2017-09-02 LAB — MAGNESIUM: Magnesium: 1.7 mg/dL (ref 1.7–2.4)

## 2017-09-02 MED ORDER — POTASSIUM PHOSPHATES 15 MMOLE/5ML IV SOLN
40.0000 meq | Freq: Once | INTRAVENOUS | Status: AC
  Administered 2017-09-02: 40 meq via INTRAVENOUS
  Filled 2017-09-02: qty 9.09

## 2017-09-02 NOTE — Progress Notes (Signed)
PROGRESS NOTE  Cory Ibarra  CBS:[id number] DOB: 1961/02/24 DOA: 08/21/2017   PCP: [name], [name]   Brief Narrative: 56 yo male presented with fever, n/v, hematuria, diarrhea.  Found to have multiple renal calculi with Rt ureteral calculus.  Required pressors in setting of septic shock and PCCM asked to assess.  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  He was admitted for febrile neutropenia and septic shock secondary to an obstructing right ureteral stone. Right-sided retrograde ureteral stent placement was unsuccessful and IR placed a right-sided percutaneous nephric nephrostomy catheter. He's grown out Escherichia coli bacteremia which is pansensitive.  Cory Ibarra is currently hemodynamically stable, but has not participated actively in his care since I've been taking care of him. He has not been eating.  He has significant electrolyte abnormalities daily.  We discussed an NG tube today until he gets his appetite back which he declined.  Palliative care consult has been placed on 9/25.  Assessment & Plan: Sepsis secondary to e. Coli bacteremia  Obstructed R ureteral Stone:   - Now s/p perc nephrostomy.  Pressors now d/c'd. - s/p perc nephrostomy tube (urology unable to place retrograde stend due to severe impaction)   - ampicillin 2 q 4 (9/25 - ) ... ceftriaxone 2 grams daily (9/19 - 9/25) (vanc 9/18-9/19; zosyn 9/18-9/19) - ucx multiple species, blood cx x2 e. Coli, pan sensitive  - off neuopgen - has been weaned off hydrocortisone - blood cultures repeated and NGTD - continue Augmentin   Orthostatic hypotension - cosyntropin negative, baseline cortisol was elevated - likely related to volume depletion - encouraged oral intake   Pancytopenia  Neutropenia - overall stable   Diffuse Large B Cell Lymphoma: dx in July 2018, last chemo 08/09/17 - chemo with [name] - outpatient follow up   Hypophosphatemia   Hypocalcemia  Hypomagnesemia  Hypokalemia:  - still low K and Phosph - supplement and repeat BMP, Mg, Phosph in AM  Elevated alk phos and ALT - viral hep studies from 8/13 notable for + Hep B core Ab and surface antibody, but negative surface antigen and negative HBV PCR  - LFT stable, no need for repeat CMET   Elevated troponin: on presentation, 2/2 demand ischemia - no CP this AM   Diarrhea - GI path panel on admission negative - improved    Malnutrition - severe PCM, nutritionist consulted  - still very poor oral intake   Concern for depression - seen by psychiatry who agreed with zyprexa  - outpatient follow up recommended   DVT prophylaxis: SCD Code Status: Partial, no CPR Family Communication: pt at bedside  Disposition Plan: discharge to SNF, possibly in AM  Consultants:   PCCM, Urology, IR, Oncology  Psych  Procedures:   Perc nephrostomy tube placement  R cystoscopy with retrograde pyelogram, unsuccessful stent attempt  Antimicrobials:  Anti-infectives    Start     Dose/Rate Route Frequency Ordered Stop   08/31/17 2200  amoxicillin-clavulanate (AUGMENTIN) 875-125 MG per tablet 1 tablet     1 tablet Oral Every 12 hours 08/31/17 1715 09/03/17 2159   08/29/17 0800  ampicillin (OMNIPEN) 2 g in sodium chloride 0.9 % 50 mL IVPB  Status:  Discontinued     2 g 150 mL/hr over 20 Minutes Intravenous Every 4 hours 08/28/17 1535 08/31/17 1715   08/22/17 1000  cefTRIAXone (ROCEPHIN) 2 g in dextrose 5 % 50 mL IVPB  Status:  Discontinued  2 g 100 mL/hr over 30 Minutes Intravenous Every 24 hours 08/22/17 0836 08/28/17 1535   08/22/17 0000  vancomycin (VANCOCIN) IVPB 750 mg/150 ml premix  Status:  Discontinued     750 mg 150 mL/hr over 60 Minutes Intravenous Every 8 hours 08/21/17 1858 08/22/17 0836   08/21/17 2200  piperacillin-tazobactam (ZOSYN) IVPB 3.375 g  Status:  Discontinued     3.375 g 12.5 mL/hr over 240 Minutes Intravenous Every 8 hours 08/21/17 1858  08/22/17 0836   08/21/17 1530  vancomycin (VANCOCIN) 1,500 mg in sodium chloride 0.9 % 500 mL IVPB     1,500 mg 250 mL/hr over 120 Minutes Intravenous  Once 08/21/17 1525 08/21/17 1745   08/21/17 1500  piperacillin-tazobactam (ZOSYN) IVPB 3.375 g     3.375 g 100 mL/hr over 30 Minutes Intravenous  Once 08/21/17 1451 08/21/17 1550   08/21/17 1500  vancomycin (VANCOCIN) IVPB 1000 mg/200 mL premix  Status:  Discontinued     1,000 mg 200 mL/hr over 60 Minutes Intravenous  Once 08/21/17 1451 08/21/17 1525      Subjective: No new concerns.  Objective: Vitals:   09/01/17 0521 09/01/17 1352 09/01/17 2224 09/02/17 0638  BP: 111/63 110/66 110/71 116/63  Pulse: 93 97 95 (!) 107  Resp: 18 18 18 16   Temp: (!) 97.5 F (36.4 C) 98.2 F (36.8 C) 98.4 F (36.9 C) 97.8 F (36.6 C)  TempSrc: Oral Oral Oral Oral  SpO2: 97% 97% 97% 97%  Weight:      Height:        Intake/Output Summary (Last 24 hours) at 09/02/17 1144 Last data filed at 09/02/17 0639  Gross per 24 hour  Intake                0 ml  Output             1570 ml  Net            -1570 ml   Filed Weights   08/21/17 1216 08/21/17 1441 08/21/17 1643  Weight: 81.6 kg (180 lb) 81.6 kg (180 lb) 76.6 kg (168 lb 14 oz)   Physical Exam  Constitutional: Appears calm, flat affect, NAD CVS: RRR, S1/S2 +, no murmurs, no gallops, no carotid bruit.  Pulmonary: Effort and breath sounds normal, no stridor, diminished sounds at bases  Abdominal: Soft. BS +,  no distension, tenderness, rebound or guarding.  Musculoskeletal: Normal range of motion. No edema and no tenderness.   Data Reviewed: I have personally reviewed following labs and imaging studies  CBC:  Recent Labs Lab 08/27/17 0348 08/28/17 0525 08/29/17 0316 08/30/17 0426 08/31/17 0439 09/02/17 0356  WBC 13.4* 10.1 8.2 6.2 4.7 5.7  NEUTROABS 11.6* 8.2* 6.4 5.1  --   --   HGB 8.7* 9.6* 9.4* 9.0* 9.2* 9.9*  HCT 25.2* 28.3* 27.4* 26.5* 27.0* 29.9*  MCV 73.5* 75.1* 74.5*  74.9* 74.8* 74.9*  PLT 63* 79* 85* 120* 177 102   Basic Metabolic Panel:  Recent Labs Lab 08/28/17 0525 08/29/17 0316 08/30/17 0426 08/31/17 0439 09/01/17 0341 09/02/17 0356  NA 137 134* 135 135 135 136  K 3.8 3.1* 3.4* 3.6 3.6 3.4*  CL 103 102 104 105 104 104  CO2 25 24 23 23 23 22   GLUCOSE 86 108* 109* 111* 100* 92  BUN 6 <5* <5* <5* 6 8  CREATININE 0.33* 0.30* <0.30* <0.30* <0.30* <0.30*  CALCIUM 6.4* 6.3* 6.2* 6.3* 6.8* 7.1*  MG 1.7 1.7 1.6* 1.9  --  1.7  PHOS 2.0* 1.8* 1.7* 2.0* 2.4* 1.9*   Liver Function Tests:  Recent Labs Lab 08/27/17 0348 08/28/17 0525 08/29/17 0316 08/30/17 0426  AST 20 20 17 15   ALT 73* 57 43 32  ALKPHOS 126 122 114 106  BILITOT 1.2 1.4* 0.9 1.2  PROT 4.6* 5.2* 5.2* 5.0*  ALBUMIN 2.3* 2.5* 2.5* 2.3*   Recent Results (from the past 240 hour(s))  Culture, blood (routine x 2)     Status: None   Collection Time: 08/25/17  4:43 PM  Result Value Ref Range Status   Specimen Description BLOOD PORTA CATH  Final   Special Requests   Final    BOTTLES DRAWN AEROBIC AND ANAEROBIC Blood Culture adequate volume   Culture   Final    NO GROWTH 5 DAYS Performed at [facility], [place] [address], [place], [place] [address]    Report Status 08/31/2017 FINAL  Final  Culture, blood (routine x 2)     Status: None   Collection Time: 08/25/17  4:44 PM  Result Value Ref Range Status   Specimen Description BLOOD BLOOD LEFT HAND  Final   Special Requests   Final    BOTTLES DRAWN AEROBIC AND ANAEROBIC Blood Culture results may not be optimal due to an excessive volume of blood received in culture bottles   Culture   Final    NO GROWTH 5 DAYS Performed at [facility], [place] [address] [place], [place] [address]    Report Status 08/31/2017 FINAL  Final    Radiology Studies: No results found.   Scheduled Meds: . amoxicillin-clavulanate  1 tablet Oral Q12H  . chlorhexidine  15 mL Mouth Rinse BID  . Chlorhexidine Gluconate Cloth  6 each Topical  Daily  . dronabinol  5 mg Oral QAC lunch  . feeding supplement (ENSURE ENLIVE)  237 mL Oral BID BM  . mouth rinse  15 mL Mouth Rinse q12n4p  . OLANZapine zydis  2.5 mg Oral QHS  . pantoprazole  40 mg Oral Daily  . potassium chloride  40 mEq Oral BID  . sodium chloride flush  10-40 mL Intracatheter Q12H  . sodium chloride flush  5 mL Intravenous Q8H  . thiamine  100 mg Oral Daily   Or  . thiamine injection  100 mg Intravenous Daily   Continuous Infusions:   LOS: 12 days    Time spent: 15 minutes  Faye Ramsay, MD [facility] [phone]  If 7PM-7AM, please contact night-coverage [email or web address] Password [email or web address] 09/02/2017, 11:44 AM

## 2017-09-03 LAB — MAGNESIUM: Magnesium: 1.7 mg/dL (ref 1.7–2.4)

## 2017-09-03 LAB — BASIC METABOLIC PANEL
Anion gap: 15 (ref 5–15)
BUN: 8 mg/dL (ref 6–20)
CALCIUM: 6.8 mg/dL — AB (ref 8.9–10.3)
CO2: 20 mmol/L — AB (ref 22–32)
CREATININE: 0.38 mg/dL — AB (ref 0.61–1.24)
Chloride: 101 mmol/L (ref 101–111)
GFR calc Af Amer: >60 mL/min (ref >60)
GFR calc non Af Amer: >60 mL/min (ref >60)
GLUCOSE: 103 mg/dL — AB (ref 65–99)
Potassium: 3.1 mmol/L — ABNORMAL LOW (ref 3.5–5.1)
Sodium: 136 mmol/L (ref 135–145)

## 2017-09-03 LAB — PHOSPHORUS: Phosphorus: 1.9 mg/dL — ABNORMAL LOW (ref 2.5–4.6)

## 2017-09-03 LAB — CBC
HCT: 29.5 % — ABNORMAL LOW (ref 39.0–52.0)
Hemoglobin: 10 g/dL — ABNORMAL LOW (ref 13.0–17.0)
MCH: 25.1 pg — ABNORMAL LOW (ref 26.0–34.0)
MCHC: 33.9 g/dL (ref 30.0–36.0)
MCV: 74.1 fL — AB (ref 78.0–100.0)
PLATELETS: 403 10*3/uL — AB (ref 150–400)
RBC: 3.98 MIL/uL — ABNORMAL LOW (ref 4.22–5.81)
RDW: 19.1 % — AB (ref 11.5–15.5)
WBC: 6.3 10*3/uL (ref 4.0–10.5)

## 2017-09-03 MED ORDER — CALCIUM CARBONATE ANTACID 500 MG PO CHEW: 1.0000 | CHEWABLE_TABLET | Freq: Two times a day (BID) | ORAL | 3 refills | Status: DC

## 2017-09-03 MED ORDER — OXYCODONE HCL 5 MG PO TABS: 5.0000 mg | ORAL_TABLET | ORAL | 0 refills | Status: DC | PRN

## 2017-09-03 MED ORDER — AMOXICILLIN-POT CLAVULANATE 875-125 MG PO TABS: 1.0000 | ORAL_TABLET | Freq: Two times a day (BID) | ORAL | 0 refills | Status: DC

## 2017-09-03 MED ORDER — OLANZAPINE 5 MG PO TBDP: 2.5000 mg | ORAL_TABLET | Freq: Every day | ORAL | 0 refills | Status: AC

## 2017-09-03 MED ORDER — HEPARIN SOD (PORK) LOCK FLUSH 100 UNIT/ML IV SOLN
500.0000 [IU] | INTRAVENOUS | Status: AC | PRN
  Administered 2017-09-03: 500 [IU]

## 2017-09-03 MED ORDER — ENSURE ENLIVE PO LIQD: 237.0000 mL | Freq: Two times a day (BID) | ORAL | 12 refills | Status: DC

## 2017-09-03 MED ORDER — DRONABINOL 5 MG PO CAPS: 5.0000 mg | ORAL_CAPSULE | Freq: Every day | ORAL | 0 refills | Status: DC

## 2017-09-03 MED ORDER — CHLORHEXIDINE GLUCONATE 0.12 % MT SOLN: 15.0000 mL | Freq: Two times a day (BID) | OROMUCOSAL | 0 refills | Status: DC

## 2017-09-03 MED ORDER — POTASSIUM PHOSPHATE MONOBASIC 500 MG PO TABS: 500.0000 mg | ORAL_TABLET | Freq: Two times a day (BID) | ORAL | 0 refills | Status: DC

## 2017-09-03 MED ORDER — LORAZEPAM 0.5 MG PO TABS: 0.5000 mg | ORAL_TABLET | Freq: Four times a day (QID) | ORAL | 0 refills | Status: DC | PRN

## 2017-09-03 NOTE — Progress Notes (Signed)
Date: September 03, 2017 Discharge orders review for case management needs.  None found  Per patient or family member no additional needs at home. Velva Harman, BSN, [place], CCM:  [phone]

## 2017-09-03 NOTE — Discharge Instructions (Signed)
Malnutrition Malnutrition is any condition in which nutrition is poor. There are many forms of malnutrition. A common form is having too little of one kind of nutrient (nutritional deficiency). Nutrients include proteins, minerals, carbohydrates, fats, and vitamins. They provide the body with energy and keep the body working normally. Malnutrition ranges from mild to severe. The condition affects the body's defense system (immune system). Because of this, people who are malnourished are more likely to develop health problems and get sick. What are the causes? Causes of malnutrition include:  Eating an unbalanced diet.  Eating too much of certain foods.  Eating too little.  Conditions that decrease the body's ability to use nutrients.  What increases the risk? Risk factors include:  Pregnancy and lactation. Women who are pregnant may become malnourished if they do not increase their nutrient intake. They are also susceptible to folic acid deficiency.  Increasing age. The body's ability to absorb nutrients decreases with age. This can contribute to iron, calcium, and vitamin D deficiencies.  Alcohol or drug dependency. Addiction often leads to a lifestyle in which proper nourishment is ignored. Dependency can also hurt the metabolism and the body's ability to absorb nutrients. Alcoholism is a major cause of thiamine deficiency and can lead to deficiencies of magnesium, zinc, and other vitamins.  Eating disorders, such as anorexia nervosa. People with these disorders may eat too little or too much.  Chewing or swallowing problems. People with these disorders may not eat enough.  Certain diseases, including: ? Long-lasting (chronic) diseases. Chronic diseases tend to affect the absorption of calcium, iron, and vitamins B12, A, D, E, and K. ? Liver disease. Liver disease affects the storage of vitamins A and B12. It also interferes with the metabolism of protein and energy sources. ? Kidney  disease. Kidney disease may cause deficiencies of protein, iron, and vitamin D. ? Cancer or AIDS. These diseases can cause a loss of appetite. ? Cystic fibrosis. This disease can make it difficult for the body to absorb nutrients.  Certain diets, including. ? The vegetarian diet. Vegetarians are at risk for iron deficiency. ? The vegan diet. Vegans are susceptible to vitamin B12, calcium, iron, vitamin D, and zinc deficiencies. ? The fruitarian diet. This diet can be deficient in protein, sodium, and many micronutrients. ? Many commercial "fad" diets, including those that claim to enhance well-being and reduce weight. ? Very low calorie diets.  Low income. People with a low income may have trouble paying for nutritious foods.  What are the signs or symptoms? Signs and symptoms depend on the kind of malnutrition you have. Common symptoms include:  Fatigue.  Weakness.  Dizziness.  Fainting  Weight loss.  Poor immune response.  Lack of menstruation.  Hair loss.  Poor memory.  How is this diagnosed? Malnutrition may be diagnosed by:  A medical history.  A dietary history.  A physical exam. This may include a measurement of your body mass index (BMI).  Blood tests.  How is this treated? Treatments vary depending on the cause of the malnutrition. Common treatments include:  Dietary changes.  Dietary supplements, such as vitamins and minerals.  Treatment of any underlying conditions.  Follow these instructions at home:  Eat a balanced diet.  Take dietary supplements as directed by your health care provider.  Exercise regularly. Exercising can improve appetite.  Keep all follow-up visits as directed by your health care provider. This is important. How is this prevented? Eating a well-balanced diet helps to prevent most forms   of malnutrition. Contact a health care provider if:  You have increased weakness or fatigue.  You faint.  You stop  menstruating.  You have rapid hair loss.  You have unexpected weight loss. This information is not intended to replace advice given to you by your health care provider. Make sure you discuss any questions you have with your health care provider. Document Released: 10/06/2005 Document Revised: 04/27/2016 Document Reviewed: 07/17/2014 Elsevier Interactive Patient Education  2018 [employer]  

## 2017-09-03 NOTE — Discharge Summary (Signed)
Physician Discharge Summary  Cory Ibarra WIO:[id number] DOB: 13-Aug-1961 DOA: 08/21/2017  PCP: [name], [name]  Admit date: 08/21/2017 Discharge date: 09/03/2017  Recommendations for Outpatient Follow-up:  1. Pt will need to follow up with PCP in 1 weeks post discharge 2. Please obtain BMP to evaluate electrolytes and kidney function, calcium, phosphate, magnesium  3. Please also check CBC to evaluate Hg and Hct levels 4. Pt to complete one more day of ABX Augmentin  5. Allopurinol has been removed from pt's list as pt no longer takes this medication   Discharge Diagnoses:  Principal Problem:   Septic shock due to Escherichia coli [facility]) Active Problems:   Neutropenia ([place])   Gross hematuria   Lactic acidosis   Neutropenic sepsis (HCC)   Adjustment disorder with mixed anxiety and depressed mood   Palliative care encounter   Goals of care, counseling/discussion  Discharge Condition: Stable  Diet recommendation: Heart healthy diet discussed in details   History of present illness:  56 yo male presented with fever, n/v, hematuria, diarrhea. Found to have multiple renal calculi with Rt ureteral calculus. Required pressors in setting of septic shock and PCCM asked to assess. 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  He was admitted for febrile neutropenia and septic shock secondary to an obstructing right ureteral stone. Right-sided retrograde ureteral stent placement was unsuccessful and IR placed a right-sided percutaneous nephric nephrostomy catheter. He's grown out Escherichia coli bacteremia which is pansensitive.  Mr. Cory Ibarra is currently hemodynamically stable, but has not participated actively in his care since I've been taking care of him. He has not been eating.  He has significant electrolyte abnormalities daily.  We discussed an NG tube today until he gets his appetite back which he  declined.  Palliative care consult has been placed on 9/25.  Assessment & Plan: Sepsis secondary to e. Coli bacteremia  Obstructed R ureteral Stone:   - Now s/p perc nephrostomy.  Pressors now d/c'd. - s/p perc nephrostomy tube (urology unable to place retrograde stend due to severe impaction)   - ampicillin 2 q 4 (9/25 - ) ... ceftriaxone 2 grams daily (9/19 - 9/25) (vanc 9/18-9/19; zosyn 9/18-9/19) - ucx multiple species, blood cx x2 e. 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, last chemo 08/09/17 - chemo with [name] - outpatient follow up   Hypophosphatemia  Hypocalcemia  Hypomagnesemia  Hypokalemia:  - still low K and Phosph - supplement and repeat BMP, Mg, Phosph in 1- days  Elevated alk phos and ALT - viral hep studies from 8/13 notable for + Hep B core Ab and surface antibody, but negative surface antigen and negative HBV PCR  - LFT stable, no need for repeat CMET   Elevated troponin: on presentation, 2/2 demand ischemia - no CP this AM   Diarrhea - GI path panel on admission negative - improved   Malnutrition - severe PCM, nutritionist consulted  - still very poor oral intake  - intake encouraged   Concern for depression - seen by psychiatry who agreed  with zyprexa  - outpatient follow up recommended   DVT prophylaxis: SCD Code Status: Partial, no CPR Family Communication: pt at bedside  Disposition Plan: SNF  Consultants:   PCCM, Urology, IR, Oncology  Psych  Procedures:   Perc nephrostomy tube placement  R cystoscopy with retrograde  pyelogram, unsuccessful stent attempt   Procedures/Studies: Dg Chest 2 View  Result Date: 08/21/2017 CLINICAL DATA:  Dizzy in short of breath EXAM: CHEST  2 VIEW COMPARISON:  06/28/2017 FINDINGS: Right jugular Port-A-Cath placed with its tip at the cavoatrial junction. No pneumothorax. Lungs are hyperaerated and clear. Normal heart size. No pleural effusion. IMPRESSION: No active cardiopulmonary disease. Electronically Signed   By: Jolaine Click M.D.   On: 08/21/2017 13:50   Dg Thoracic Spine 2 View  Result Date: 08/21/2017 CLINICAL DATA:  Pain. History of lower B-cell non-Hodgkin's lymphoma EXAM: THORACIC SPINE 3 VIEWS COMPARISON:  MR thoracic spine June 28, 2017 and chest radiograph August 21, 2017 FINDINGS: Frontal, lateral, and swimmer's views obtained. There is no evident acute fracture or spondylolisthesis. The pedicle at T12 on the right is no longer apparent and is presumably destroyed by neoplasm. This finding was evident on prior MR. there is subtle loss of detail along the lateral left T8 pedicle, noted by prior MR to be involved with tumor. There is no obvious neoplastic involvement vertebral bodies by radiography. MR shows tumor involving several vertebral bodies. IMPRESSION: No fracture or spondylolisthesis. There are multiple metastatic foci seen in the thoracic spine performed three months prior. By radiography, there is loss of the pedicle of T12 on the right and subtle loss of the lateral aspect of the pedicle of T8 on the left. 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 No other neoplastic foci identified by radiography the bony structures. Note that there are multiple foci of tumor and thoracic vertebral bodies seen on recent MR not seen by radiography currently. Electronically Signed   By: Bretta Bang III M.D.   On: 08/21/2017 16:00   Dg Lumbar Spine Complete  Addendum Date: 08/21/2017   ADDENDUM REPORT: 08/21/2017 16:02 ADDENDUM: There is also  loss of the pedicle on the right at T12, indicative of bony metastatic disease in this area. Electronically Signed   By: Bretta Bang III M.D.   On: 08/21/2017 16:02   Result Date: 08/21/2017 CLINICAL DATA:  Pain. History of large B-cell non-Hodgkin's lymphoma EXAM: LUMBAR SPINE - COMPLETE 4+ VIEW COMPARISON:  Lumbar MRI June 28, 2017 FINDINGS: Frontal, lateral, spot lumbosacral lateral, and bilateral oblique views were obtained. There are 5 non-rib-bearing lumbar type vertebral bodies. There is a stable fracture of the L3 vertebral body, more severe along the leftward aspect. There is lucency in this area suggesting metastatic focus as likely etiology for the fracture. There is apparent destruction in the region of the pedicle on the left at L3. No other fracture evident. No spondylolisthesis. Disc spaces appear normal. No other lytic or destructive lesions. There is a probable bone island medial upper right iliac crest. There is facet osteoarthritic change at L4-5 and L5-S1 bilaterally. IMPRESSION: There is again noted fracture L3 vertebral body. Tumor extends into and involves the left L3 pedicle. This finding was present on prior lumbar MR. No new bone lesions are evident. No new fracture. No spondylolisthesis. There is facet osteoarthritic change at L4-5 L5-S1 bilaterally. Electronically Signed: By: Bretta Bang III M.D. On: 08/21/2017 15:52   Dg C-arm 1-60 Min-no Report  Result Date: 08/21/2017  Fluoroscopy was utilized by the requesting physician.  No radiographic interpretation.   Ir Nephrostomy Placement Right  Result Date: 08/22/2017 INDICATION: History of lymphoma, admitted with neutropenic fever in urosepsis secondary to right-sided ureteral stone. Post failed attempted right-sided ureteral stent placement. As such, request made for placement of a right-sided percutaneous nephrostomy catheter for infection source control purposes. EXAM: 1. ULTRASOUND GUIDANCE FOR PUNCTURE OF THE RIGHT  RENAL COLLECTING SYSTEM 2. RIGHT PERCUTANEOUS NEPHROSTOMY TUBE PLACEMENT. COMPARISON:  CT abdomen pelvis - 07/30/2017; right-sided retrograde pyelogram and attempted stent placement - earlier same day MEDICATIONS: The patient is currently admitted to the hospital and receiving intravenous antibiotics.;The antibiotic was administered in an appropriate time frame prior to skin puncture. ANESTHESIA/SEDATION: Moderate (conscious) sedation was employed during this procedure. A total of Versed 1 mg and Fentanyl 75 mcg was administered intravenously. Moderate Sedation Time: 5 minutes. The patient's level of consciousness and vital signs were monitored continuously by radiology nursing throughout the procedure under my direct supervision. CONTRAST:  20 mL Isovue 300 administered into the collecting system FLUOROSCOPY TIME:  1 minute 6 seconds (78.9 mGy) COMPLICATIONS: None immediate. PROCEDURE: The procedure, risks, benefits, and alternatives were explained to the patient. Questions regarding the procedure were encouraged and answered. The patient understands and consents to the procedure. A timeout was performed prior to the initiation of the procedure. The right flank region was prepped with Betadine in a sterile fashion, and a sterile drape was applied covering the operative field. A sterile gown and sterile gloves were used for the procedure. Local anesthesia was provided with 1% Lidocaine with epinephrine. Ultrasound was used to localize the right kidney. Under direct ultrasound guidance, a 21 gauge needle was advanced into the renal collecting system. An ultrasound image documentation was performed. Access within the collecting system was confirmed with the efflux of urine followed by contrast injection. Over a Nitrex wire, the inner three [place] catheter of an Accustick set was advanced into the renal collecting system. Contrast injection was injected into the collecting system as several spot radiographs were  obtained in various obliquities confirming puncture within a posterior inferior calix. As such, the tract was dilated with an Accustick stent. Over a guide wire, a 10-French percutaneous nephrostomy catheter was advanced into the collecting system where the coil was formed and locked. Contrast was injected and several sport radiographs were obtained in various obliquities confirming access. The catheter was secured at the skin with a Prolene retention suture and a gravity bag was placed. A dressing was placed. The patient tolerated procedure well without immediate postprocedural complication. FINDINGS: Ultrasound scanning demonstrates a moderately dilated right renal collecting system. Under direct ultrasound guidance, a posterior inferior calix was targeted allowing advancement of an 10-French percutaneous nephrostomy catheter under intermittent fluoroscopic guidance. Contrast injection confirmed appropriate positioning. Note is again made of an occlusive stone within the distal aspect of the right ureter. IMPRESSION: Successful ultrasound and fluoroscopic guided placement of a right sided 10 French PCN. This stone may be use for subsequent percutaneous nephrolithotomy at a later date as clinically indicated. Electronically Signed   By: Sandi Mariscal M.D.   On: 08/22/2017 07:59     Discharge Exam: Vitals:   09/02/17 2145 09/03/17 0540  BP: 120/62 116/60  Pulse: 100 (!) 108  Resp: 19 18  Temp: 98.4 F (36.9 C) 98 F (36.7 C)  SpO2: 100% 97%   Vitals:   09/02/17 0638 09/02/17 1432 09/02/17 2145 09/03/17 0540  BP: 116/63 112/68 120/62 116/60  Pulse: Marland Kitchen)  107 (!) 103 100 (!) 108  Resp: _0 Temp: 97.8 F (36.6 C) 98.6 F (37 C) 98.4 F (36.9 C) 98 F (36.7 C)  TempSrc: Oral Oral Oral Oral  SpO2: 97% 97% 100% 97%  Weight:      Height:        General: Pt is alert, follows commands appropriately, flat affect Cardiovascular: Regular rate and rhythm, S1/S2 +, no murmurs, no rubs, no  gallops Respiratory: Clear to auscultation bilaterally, no wheezing, no crackles, no rhonchi Abdominal: Soft, non tender, non distended, bowel sounds +, no guarding  Discharge Instructions  Discharge Instructions    Diet - low sodium heart healthy    Complete by:  As directed    Increase activity slowly    Complete by:  As directed      Allergies as of 09/03/2017      Reactions   Bee Venom Swelling      Medication List    STOP taking these medications   allopurinol 300 MG tablet Commonly known as:  ZYLOPRIM   dexamethasone 4 MG tablet Commonly known as:  DECADRON   ondansetron 8 MG tablet Commonly known as:  ZOFRAN     TAKE these medications   amoxicillin-clavulanate 875-125 MG tablet Commonly known as:  AUGMENTIN Take 1 tablet by mouth every 12 (twelve) hours.   chlorhexidine 0.12 % solution Commonly known as:  PERIDEX 15 mLs by Mouth Rinse route 2 (two) times daily.   dronabinol 5 MG capsule Commonly known as:  MARINOL Take 1 capsule (5 mg total) by mouth daily before lunch.   feeding supplement (ENSURE ENLIVE) Liqd Take 237 mLs by mouth 2 (two) times daily between meals.   lidocaine-prilocaine cream Commonly known as:  EMLA Apply to affected area once   LORazepam 0.5 MG tablet Commonly known as:  ATIVAN Take 1 tablet (0.5 mg total) by mouth every 6 (six) hours as needed (Nausea or vomiting).   OLANZapine zydis 5 MG disintegrating tablet Commonly known as:  ZYPREXA Take 0.5 tablets (2.5 mg total) by mouth at bedtime.   oxyCODONE 5 MG immediate release tablet Commonly known as:  Oxy IR/ROXICODONE Take 1 tablet (5 mg total) by mouth every 4 (four) hours as needed for moderate pain or severe pain.   pantoprazole 40 MG tablet Commonly known as:  PROTONIX Take 1 tablet (40 mg total) by mouth daily at 12 noon.   polyethylene glycol packet Commonly known as:  MIRALAX / GLYCOLAX Take 17 g by mouth daily as needed for mild constipation.   potassium  phosphate (monobasic) 500 MG tablet Commonly known as:  K-PHOS Take 1 tablet (500 mg total) by mouth 2 (two) times daily with a meal.            Discharge Care Instructions        Start     Ordered   09/03/17 0000  amoxicillin-clavulanate (AUGMENTIN) 875-125 MG tablet  Every 12 hours     09/03/17 1020   09/03/17 0000  chlorhexidine (PERIDEX) 0.12 % solution  2 times daily     09/03/17 1020   09/03/17 0000  dronabinol (MARINOL) 5 MG capsule  Daily before lunch     09/03/17 1020   09/03/17 0000  feeding supplement, ENSURE ENLIVE, (ENSURE ENLIVE) LIQD  2 times daily between meals     09/03/17 1020   09/03/17 0000  LORazepam (ATIVAN) 0.5 MG tablet  Every 6 hours PRN     09/03/17 1020  09/03/17 0000  OLANZapine zydis (ZYPREXA) 5 MG disintegrating tablet  Daily at bedtime     09/03/17 1020   09/03/17 0000  oxyCODONE (OXY IR/ROXICODONE) 5 MG immediate release tablet  Every 4 hours PRN     09/03/17 1020   09/03/17 0000  potassium phosphate, monobasic, (K-PHOS) 500 MG tablet  2 times daily with meals     09/03/17 1020   09/03/17 0000  Increase activity slowly     09/03/17 1020   09/03/17 0000  Diet - low sodium heart healthy     09/03/17 1020      Contact information for follow-up providers    Alexis Frock, MD Follow up.   Specialty:  Urology Why:  Office will call to arrange follow up visit to discuss further stone treatment.  Contact information: [place] [address] [phone]        [name], [name] Follow up.   Specialty:  Family Medicine Contact information: [place] [address] [phone]            Contact information for after-discharge care    Destination    [facility] Follow up.   Specialty:  [place] information: [address] [place] [phone]                 The results of significant diagnostics from this  hospitalization (including imaging, microbiology, ancillary and laboratory) are listed below for reference.    Microbiology: Recent Results (from the past 240 hour(s))  Culture, blood (routine x 2)     Status: None   Collection Time: 08/25/17  4:43 PM  Result Value Ref Range Status   Specimen Description BLOOD PORTA CATH  Final   Special Requests   Final    BOTTLES DRAWN AEROBIC AND ANAEROBIC Blood Culture adequate volume   Culture   Final    NO GROWTH 5 DAYS Performed at [facility], [address] [place], [place] [address]    Report Status 08/31/2017 FINAL  Final  Culture, blood (routine x 2)     Status: None   Collection Time: 08/25/17  4:44 PM  Result Value Ref Range Status   Specimen Description BLOOD BLOOD LEFT HAND  Final   Special Requests   Final    BOTTLES DRAWN AEROBIC AND ANAEROBIC Blood Culture results may not be optimal due to an excessive volume of blood received in culture bottles   Culture   Final    NO GROWTH 5 DAYS Performed at [facility], [place] [address] [place], [place] [address]    Report Status 08/31/2017 FINAL  Final     Labs: Basic Metabolic Panel:  Recent Labs Lab 08/29/17 0316 08/30/17 0426 08/31/17 0439 09/01/17 0341 09/02/17 0356 09/03/17 0410  NA 134* 135 135 135 136 136  K 3.1* 3.4* 3.6 3.6 3.4* 3.1*  CL 102 104 105 104 104 101  CO2 _0 20*  GLUCOSE 108* 109* 111* 100* 92 103*  BUN <5* <5* <5* _1 CREATININE 0.30* <0.30* <0.30* <0.30* <0.30* 0.38*  CALCIUM 6.3* 6.2* 6.3* 6.8* 7.1* 6.8*  MG 1.7 1.6* 1.9  --  1.7 1.7  PHOS 1.8* 1.7* 2.0* 2.4* 1.9* 1.9*   Liver Function Tests:  Recent Labs Lab 08/28/17 0525 08/29/17 0316 08/30/17 0426  AST _2 ALT 57 43 32  ALKPHOS 122  114 106  BILITOT 1.4* 0.9 1.2  PROT 5.2* 5.2* 5.0*  ALBUMIN 2.5* 2.5* 2.3*   CBC:  Recent Labs Lab 08/28/17 0525 08/29/17 0316 08/30/17 0426 08/31/17 0439 09/02/17 0356 09/03/17 0410  WBC 10.1 8.2 6.2 4.7 5.7 6.3   NEUTROABS 8.2* 6.4 5.1  --   --   --   HGB 9.6* 9.4* 9.0* 9.2* 9.9* 10.0*  HCT 28.3* 27.4* 26.5* 27.0* 29.9* 29.5*  MCV 75.1* 74.5* 74.9* 74.8* 74.9* 74.1*  PLT 79* 85* 120* 177 343 403*   BNP (last 3 results)  Recent Labs  08/21/17 2249  BNP 607.1*    SIGNED: Time coordinating discharge: 60 minutes  Faye Ramsay, MD  [facility] 09/03/2017, 10:20 AM Pager [phone]  If 7PM-7AM, please contact night-coverage [email or web address] Password [email or web address]

## 2017-09-03 NOTE — Progress Notes (Signed)
Reported called to [place].

## 2017-09-03 NOTE — Clinical Social Work Placement (Signed)
   CLINICAL SOCIAL WORK PLACEMENT  NOTE  Date:  09/03/2017  Patient Details  Name: Cory Ibarra MRN: [id number] Date of Birth: 1961-01-10  Clinical Social Work is seeking post-discharge placement for this patient at the [place] level of care (*CSW will initial, date and re-position this form in  chart as items are completed):  Yes   Patient/family provided with [place] Work Department's list of facilities offering this level of care within the geographic area requested by the patient (or if unable, by the patient's family).  Yes   Patient/family informed of their freedom to choose among providers that offer the needed level of care, that participate in Medicare, Medicaid or managed care program needed by the patient, have an available bed and are willing to accept the patient.  Yes   Patient/family informed of [facility]'s ownership interest in [facility] and [facility], as well as of the fact that they are under no obligation to receive care at these facilities.  PASRR submitted to EDS on       PASRR number received on       Existing PASRR number confirmed on 08/29/17     FL2 transmitted to all facilities in geographic area requested by pt/family on 09/01/17     FL2 transmitted to all facilities within larger geographic area on       Patient informed that his/her managed care company has contracts with or will negotiate with certain facilities, including the following:  [facility]     Yes   Patient/family informed of bed offers received.  Patient chooses bed at [facility]     Physician recommends and patient chooses bed at [facility]    Patient to be transferred to [facility] on 09/03/17.  Patient to be transferred to facility by ptar     Patient family notified on 09/03/17 of transfer.  Name of family member notified:  Brother-Tom-He is  completing admission paperwork at this time.      PHYSICIAN       Additional Comment:    _______________________________________________ Lia Hopping, LCSW 09/03/2017, 11:27 AM

## 2017-09-04 ENCOUNTER — Other Ambulatory Visit: Payer: Self-pay

## 2017-09-04 ENCOUNTER — Encounter: Payer: Self-pay | Admitting: Adult Health

## 2017-09-04 ENCOUNTER — Telehealth: Payer: Self-pay | Admitting: Hematology and Oncology

## 2017-09-04 ENCOUNTER — Non-Acute Institutional Stay (SKILLED_NURSING_FACILITY): Payer: Medicaid Other | Admitting: Adult Health

## 2017-09-04 DIAGNOSIS — T83032D Leakage of nephrostomy catheter, subsequent encounter: Secondary | ICD-10-CM | POA: Diagnosis not present

## 2017-09-04 DIAGNOSIS — C7951 Secondary malignant neoplasm of bone: Secondary | ICD-10-CM

## 2017-09-04 DIAGNOSIS — C8338 Diffuse large B-cell lymphoma, lymph nodes of multiple sites: Secondary | ICD-10-CM | POA: Diagnosis not present

## 2017-09-04 DIAGNOSIS — N200 Calculus of kidney: Secondary | ICD-10-CM | POA: Diagnosis not present

## 2017-09-04 DIAGNOSIS — A4151 Sepsis due to Escherichia coli [E. coli]: Secondary | ICD-10-CM | POA: Diagnosis not present

## 2017-09-04 DIAGNOSIS — R6521 Severe sepsis with septic shock: Secondary | ICD-10-CM

## 2017-09-04 DIAGNOSIS — E878 Other disorders of electrolyte and fluid balance, not elsewhere classified: Secondary | ICD-10-CM

## 2017-09-04 DIAGNOSIS — F4323 Adjustment disorder with mixed anxiety and depressed mood: Secondary | ICD-10-CM

## 2017-09-04 MED ORDER — LORAZEPAM 0.5 MG PO TABS: 0.5000 mg | ORAL_TABLET | Freq: Four times a day (QID) | ORAL | 0 refills | Status: DC | PRN

## 2017-09-04 NOTE — Telephone Encounter (Signed)
Scheduled appt per 9/26 sch message - left message for patient with appt time and date.

## 2017-09-04 NOTE — Progress Notes (Signed)
Location:   [place] Room Number: 131 A Place of Service:  SNF (31)   CODE STATUS: Full Code  Allergies  Allergen Reactions  . Bee Venom Swelling    Chief Complaint  Patient presents with  . Hospitalization Follow-up    Hospital follow up    HPI:  He is a 56 year old man who has been hospital for a febrile neutropenia and septic shock due to sepsis due to e-coli bacteremia. He has a percutaneous nephrostomy on right side. He was started on zosyn and changed to augmentin 875 mg for one more day. He is here for short term rehab with his goal to return back home.   Past Medical History:  Diagnosis Date  . Cancer ([place])   . Tendonitis     Past Surgical History:  Procedure Laterality Date  . CYSTOSCOPY W/ URETERAL STENT PLACEMENT Right 08/21/2017   Procedure: CYSTOSCOPY WITH RETROGRADE PYELOGRAM, RIGHT;  Surgeon: Alexis Frock, MD;  Location: [facility];  Service: Urology;  Laterality: Right;  . HERNIA REPAIR  1988   Dr. Vida Rigger  . IR FLUORO GUIDE PORT INSERTION RIGHT  07/26/2017  . IR NEPHROSTOMY PLACEMENT RIGHT  08/21/2017  . IR US GUIDE VASC ACCESS RIGHT  07/26/2017    Social History   Social History  . Marital status: Single    Spouse name: N/A  . Number of children: 0  . Years of education: N/A   Occupational History  . Not on file.   Social History Main Topics  . Smoking status: Never Smoker  . Smokeless tobacco: Never Used  . Alcohol use Yes     Comment: occasionally/socially  . Drug use: No  . Sexual activity: No   Other Topics Concern  . 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 Never used smokeless tobacco.   No e-cigarettes   Occasional alcohol consumption socially.   Single, no children.   Family History  Problem Relation Age of Onset  . Cancer Father       VITAL SIGNS BP 119/75   Pulse (!) 112   Temp 99.4 F (37.4 C)   Resp 18   Ht 6\' 2"  (1.88 m)   Wt  145 lb (65.8 kg)   SpO2 98%   BMI 18.62 kg/m   Patient's Medications  New Prescriptions   No medications on file  Previous Medications   AMOXICILLIN-CLAVULANATE (AUGMENTIN) 875-125 MG TABLET    Take 1 tablet by mouth every 12 (twelve) hours.   CHLORHEXIDINE (PERIDEX) 0.12 % SOLUTION    15 mLs by Mouth Rinse route 2 (two) times daily.   DRONABINOL (MARINOL) 5 MG CAPSULE    Take 1 capsule (5 mg total) by mouth daily before lunch.   LIDOCAINE-PRILOCAINE (EMLA) CREAM    Apply to affected area once   LORAZEPAM (ATIVAN) 0.5 MG TABLET    Take 1 tablet (0.5 mg total) by mouth every 6 (six) hours as needed (Nausea or vomiting).   NUTRITIONAL SUPPLEMENTS (NUTRITIONAL SUPPLEMENT PO)    House 2.0 two times daily for supplement   OLANZAPINE ZYDIS (ZYPREXA) 5 MG DISINTEGRATING TABLET    Take 0.5 tablets (2.5 mg total) by mouth at bedtime.   OXYCODONE (OXY IR/ROXICODONE) 5 MG IMMEDIATE RELEASE TABLET    Take 1 tablet (5 mg total) by mouth every 4 (four) hours as needed for moderate pain or severe pain.   PANTOPRAZOLE (PROTONIX) 40  MG TABLET    Take 1 tablet (40 mg total) by mouth daily at 12 noon.   POLYETHYLENE GLYCOL (MIRALAX / GLYCOLAX) PACKET    Take 17 g by mouth daily as needed for mild constipation.   POTASSIUM PHOSPHATE, MONOBASIC, (K-PHOS) 500 MG TABLET    Take 1 tablet (500 mg total) by mouth 2 (two) times daily with a meal.  Modified Medications   No medications on file  Discontinued Medications   CALCIUM CARBONATE (TUMS) 500 MG CHEWABLE TABLET    Chew 1 tablet (200 mg of elemental calcium total) by mouth 2 (two) times daily.   FEEDING SUPPLEMENT, ENSURE ENLIVE, (ENSURE ENLIVE) LIQD    Take 237 mLs by mouth 2 (two) times daily between meals.     SIGNIFICANT DIAGNOSTIC EXAMS  TODAY:   08-21-17:chest x-ray: No active cardiopulmonary disease.  08-21-17: lumbar spine complete x-ray: There is again noted fracture L3 vertebral body. Tumor extends into and involves the left L3 pedicle. This  finding was present on prior lumbar MR. No new bone lesions are evident. No new fracture. No spondylolisthesis. There is facet osteoarthritic change at L4-5 L5-S1 bilaterally. There is also loss of the pedicle on the right at T12, indicative of bony metastatic disease in this area.  08-21-17: thoracic spine x-ray: No fracture or spondylolisthesis. There are multiple metastatic foci Seen in the thoracic spine performed three months prior. By radiography, there is loss of the pedicle of T12 on the right and subtle loss of the lateral aspect of the pedicle of T8 on the left. 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 No other neoplastic foci identified by radiography the bony structures. Note that there are multiple foci of tumor and thoracic vertebral bodies seen on recent MR not seen by radiography currently.   08-22-17: placement of right nephrostomy placement  LABS REVIEWED:  TODAY:  08-21-17: wbc 0.1; hgb 9.8; hct 27.6; mcv 71.7; plt 76; glucose 144 bun 12; creat 0.63; k+ 3.5; na++ 135; ca 6.9; liver normal albumin 3.1; phos 1.9; BNP 607.1; uric acid 3.8 blood culture: enterobacteriaceae; e-coli 08-24-17: wbc 5.3; hgb 8.7; hct 249; mcv 73.7; plt 57; glucose 93; bun 11; creat 0.38; k+ 2.7; na++ 139; ca 6.3; albumin 2.3 08-25-17: blood culture: no growth  08-27-17: wbc 13.4; hgb 8.7; hct 25.2; mcv 73.5; plt 63; glucose 92; bun 7; creat 0.31; k+ 3.0; na++ 140; ca 6.3; alt 73; albumin 2.3  08-30-17: wbc 6.2; hgb 9.0; hct 26.5; mcv 74.9; plt 120; glucose 109; bun <5' creat <0.30; k+ 3.4; na++ 135; ca 6.2; liver normal albumin 2.3; mag 1.6; phos 1.7 09-03-17: wbc 6.3; hgb 10.0; hct 29.5; mcv 74.1; plt 403; glucose 103; bun 8; creat 0.38; k+ 3.1; na++ 136; mag 1.7; phos 1.9   Review of Systems  Constitutional: Negative for malaise/fatigue.  Respiratory: Negative for cough and shortness of breath.   Cardiovascular: Negative for chest pain, palpitations and leg swelling.    Gastrointestinal: Negative for abdominal pain, constipation and heartburn.  Musculoskeletal: Negative for back pain, joint pain and myalgias.  Skin: Negative.   Neurological: Negative for dizziness.  Psychiatric/Behavioral: The patient is not nervous/anxious.    Physical Exam  Constitutional: He is oriented to person, place, and time.  Eyes: Conjunctivae are normal.  Neck: Neck supple. No thyromegaly present.  Cardiovascular: Regular rhythm, normal heart sounds and intact distal pulses.   Slightly tachycardiac   Pulmonary/Chest: Effort normal and breath sounds normal. No respiratory distress.  Abdominal:  Soft. Bowel sounds are normal. He exhibits no distension. There is no tenderness.  Genitourinary:  Genitourinary Comments: Right nephrostomy stent   Musculoskeletal: He exhibits no edema.  Able to move all extremities   Lymphadenopathy:    He has no cervical adenopathy.  Neurological: He is alert and oriented to person, place, and time.  Skin: Skin is warm and dry. He is not diaphoretic.  Psychiatric: He has a normal mood and affect.    ASSESSMENT/ PLAN:  TODAY:  1. Bacteremia and septic shock due to e-coli UTI; will complete augmentin 875 mg twice daily for one more day.   2. 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 Was diagnosed in July 2018. He has had a chemo dose last of R-CHOP and Xgeva on 08-09-17. His weight is 145 pounds is taking marinol 5  Mg prior to lunch and is taking oxycodone 5 mg every 4 hours as needed for pain.   3. GERD: stable will continue protonix 40 mg daily   4. Electrolyte imbalance: stable will continue K-phos 500 mg twice daily   5.  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  6. Closed compression fracture of L3 lumbar vertebra: without change will continue therapy as  directed has oxycodone 5 mg every 4 hours as needed  7. Obstructing right ureteral stone: stable is status post right percutaneous nephrostomy; will monitor   Will check cbc; cmp; mag phos   MD is aware of resident's narcotic use and is in agreement with current plan of care. We will attempt to wean resident as apropriate   Ok Edwards NP [facility]  Contact [phone] Monday through Friday 8am- 5pm  After hours call [phone]

## 2017-09-04 NOTE — Telephone Encounter (Signed)
RX faxed to [facility] @ [phone], phone number [phone] 

## 2017-09-05 ENCOUNTER — Ambulatory Visit: Payer: Self-pay | Admitting: Family Medicine

## 2017-09-06 ENCOUNTER — Telehealth: Payer: Self-pay | Admitting: Hematology and Oncology

## 2017-09-06 ENCOUNTER — Encounter: Payer: Self-pay | Admitting: Internal Medicine

## 2017-09-06 ENCOUNTER — Telehealth: Payer: Self-pay

## 2017-09-06 ENCOUNTER — Other Ambulatory Visit (HOSPITAL_BASED_OUTPATIENT_CLINIC_OR_DEPARTMENT_OTHER): Payer: Medicaid Other

## 2017-09-06 ENCOUNTER — Ambulatory Visit (HOSPITAL_BASED_OUTPATIENT_CLINIC_OR_DEPARTMENT_OTHER): Payer: Medicaid Other | Admitting: Hematology and Oncology

## 2017-09-06 ENCOUNTER — Ambulatory Visit: Payer: Medicaid Other

## 2017-09-06 ENCOUNTER — Telehealth: Payer: Self-pay | Admitting: *Deleted

## 2017-09-06 ENCOUNTER — Non-Acute Institutional Stay (SKILLED_NURSING_FACILITY): Payer: Medicaid Other | Admitting: Internal Medicine

## 2017-09-06 DIAGNOSIS — C8338 Diffuse large B-cell lymphoma, lymph nodes of multiple sites: Secondary | ICD-10-CM

## 2017-09-06 DIAGNOSIS — C7951 Secondary malignant neoplasm of bone: Secondary | ICD-10-CM

## 2017-09-06 DIAGNOSIS — Z936 Other artificial openings of urinary tract status: Secondary | ICD-10-CM

## 2017-09-06 DIAGNOSIS — F4323 Adjustment disorder with mixed anxiety and depressed mood: Secondary | ICD-10-CM

## 2017-09-06 DIAGNOSIS — E43 Unspecified severe protein-calorie malnutrition: Secondary | ICD-10-CM | POA: Diagnosis not present

## 2017-09-06 DIAGNOSIS — K219 Gastro-esophageal reflux disease without esophagitis: Secondary | ICD-10-CM | POA: Diagnosis not present

## 2017-09-06 DIAGNOSIS — E878 Other disorders of electrolyte and fluid balance, not elsewhere classified: Secondary | ICD-10-CM

## 2017-09-06 LAB — COMPREHENSIVE METABOLIC PANEL
ALT: 10 U/L (ref 0–55)
ANION GAP: 15 meq/L — AB (ref 3–11)
AST: 15 U/L (ref 5–34)
Albumin: 2.5 g/dL — ABNORMAL LOW (ref 3.5–5.0)
Alkaline Phosphatase: 141 U/L (ref 40–150)
BILIRUBIN TOTAL: 1.14 mg/dL (ref 0.20–1.20)
BUN: 11.1 mg/dL (ref 7.0–26.0)
CO2: 21 meq/L — AB (ref 22–29)
Calcium: 7.1 mg/dL — ABNORMAL LOW (ref 8.4–10.4)
Chloride: 100 mEq/L (ref 98–109)
Creatinine: 0.5 mg/dL — ABNORMAL LOW (ref 0.7–1.3)
Glucose: 99 mg/dl (ref 70–140)
POTASSIUM: 2.8 meq/L — AB (ref 3.5–5.1)
Sodium: 136 mEq/L (ref 136–145)
TOTAL PROTEIN: 6.4 g/dL (ref 6.4–8.3)

## 2017-09-06 LAB — CBC WITH DIFFERENTIAL/PLATELET
BASO%: 2 % (ref 0.0–2.0)
BASOS ABS: 0.1 10*3/uL (ref 0.0–0.1)
EOS ABS: 0 10*3/uL (ref 0.0–0.5)
EOS%: 0.1 % (ref 0.0–7.0)
HCT: 30.8 % — ABNORMAL LOW (ref 38.4–49.9)
HGB: 10.4 g/dL — ABNORMAL LOW (ref 13.0–17.1)
LYMPH%: 2.9 % — AB (ref 14.0–49.0)
MCH: 24.6 pg — AB (ref 27.2–33.4)
MCHC: 33.8 g/dL (ref 32.0–36.0)
MCV: 72.8 fL — AB (ref 79.3–98.0)
MONO#: 1.3 10*3/uL — AB (ref 0.1–0.9)
MONO%: 17.2 % — AB (ref 0.0–14.0)
NEUT%: 77.8 % — AB (ref 39.0–75.0)
NEUTROS ABS: 5.7 10*3/uL (ref 1.5–6.5)
PLATELETS: 543 10*3/uL — AB (ref 140–400)
RBC: 4.23 10*6/uL (ref 4.20–5.82)
RDW: 19.6 % — ABNORMAL HIGH (ref 11.0–14.6)
WBC: 7.4 10*3/uL (ref 4.0–10.3)
lymph#: 0.2 10*3/uL — ABNORMAL LOW (ref 0.9–3.3)

## 2017-09-06 LAB — URIC ACID: URIC ACID, SERUM: 4.8 mg/dL (ref 2.6–7.4)

## 2017-09-06 LAB — MAGNESIUM: MAGNESIUM: 1.8 mg/dL (ref 1.5–2.5)

## 2017-09-06 LAB — LACTATE DEHYDROGENASE: LDH: 293 U/L — ABNORMAL HIGH (ref 125–245)

## 2017-09-06 MED ORDER — OXYCODONE HCL ER 10 MG PO T12A: 10.0000 mg | EXTENDED_RELEASE_TABLET | Freq: Two times a day (BID) | ORAL | 0 refills | Status: DC

## 2017-09-06 MED ORDER — ONDANSETRON HCL 8 MG PO TABS: 8.0000 mg | ORAL_TABLET | Freq: Three times a day (TID) | ORAL | 3 refills | Status: DC | PRN

## 2017-09-06 MED ORDER — OXYCODONE HCL 10 MG PO TABS: 10.0000 mg | ORAL_TABLET | ORAL | 0 refills | Status: DC | PRN

## 2017-09-06 MED ORDER — PROCHLORPERAZINE MALEATE 10 MG PO TABS: 10.0000 mg | ORAL_TABLET | Freq: Four times a day (QID) | ORAL | 0 refills | Status: DC | PRN

## 2017-09-06 NOTE — Progress Notes (Signed)
Patient ID: Cory Ibarra, male   DOB: 08-18-1961, 56 y.o.   MRN: [id number]     HISTORY AND PHYSICAL   DATE:  September 06, 2017  Location:   [facility] Room Number: 676 A Place of Service: SNF (31)   Extended Emergency Contact Information Primary Emergency Contact: Kirshner,Michael Address: [address] [place], [place] [address] [name] of [place] Phone: [phone] Relation: Brother Secondary Emergency Contact: Constance Haw, [place] [address] [name] of [place] Phone: [phone] Work Phone: [phone] Mobile Phone: [phone] Relation: Brother  Advanced Directive information Does Patient Have a Medical Advance Directive?: No, Does patient want to make changes to medical advance directive?: No - Patient declined  Chief Complaint  Patient presents with  . 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 He presented to the ED with fever, N/V, hematuria, diarrhea. Imaging revealed multiple renal calculi with right ureteral calculus. He req'd pressor support. Lytes repleted. Palliative care consulted. IR placed right percutaneous nephrostomy cath that resulted (+) E coli. He was tx with IV vanco/zosyn-->IV rocephin -->IV ampicillin since 08/28/17. He was given neupogen and hydrocortisone. urology was consulted prior to IR and he underwent right cystoscopy with retrograde pyelogram but stent placement was unsuccessful. Imaging studies revealed multiple areas of lucency in vertebrae. Hgb 10; abs neutrophil 0K-->18.8K-->5.1K; Plts 403K; T bili 2.2-->1.2; K dropped 2.7-->3.1; albumin 2.3; ALT peaked 107-->32; phosphorus  <1-->1.9; Ca 6.8 but corrected to 8.1-8.5 at d/c. He presents to SNF for short term rehab.  Today he reports no concerns. Appetite poor. Sleeps often. No nursing issues. No falls. No f/c. No CP or SOB. No issues with nephrostomy tube. He completed augmentin tx. He gets nutritional supplements per facility protocol. His albumin is 2.3.  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 He has rec'd chemotx including [name] and xgeva.  His weight dropped from 168 lbs in Sept 2018 to 145 lbs in Oct 2018. He takes marinol 5 mg prior to lunch and oxycodone 5 mg every 4 hours as needed for pain.   GERD - stable on protonix 40 mg daily   Electrolyte disturbance - stable on K-phos 500 mg twice daily. K 3.1; Phos 1.9  Adjustment disorder with mild anxiety and depressed mood - stable on zyprexia 2.5 mg nightly and ativan 0.5 mg every 6 hours as needed for the next 14 days.  Past Medical History:  Diagnosis Date  . Cancer ([place])   . Tendonitis     Past Surgical History:  Procedure Laterality Date  . CYSTOSCOPY W/ URETERAL STENT PLACEMENT Right 08/21/2017   Procedure: CYSTOSCOPY WITH RETROGRADE PYELOGRAM, RIGHT;  Surgeon: Alexis Frock, MD;  Location: [facility];  Service: Urology;  Laterality: Right;  . HERNIA REPAIR  1988   Dr. Vida Rigger  . IR FLUORO GUIDE PORT INSERTION RIGHT  07/26/2017  . IR NEPHROSTOMY PLACEMENT RIGHT  08/21/2017  . IR US GUIDE VASC ACCESS RIGHT  07/26/2017    Patient Care Team: [name], [name] as PCP - General (  Family Medicine)  Social History   Social History  . Marital status: Single    Spouse name: N/A  . Number of children: 0  . Years of education: N/A   Occupational History  . Not on file.   Social History Main Topics  . Smoking status: Never Smoker  . Smokeless tobacco: Never Used  . Alcohol use Yes     Comment: occasionally/socially  . Drug use: No  . Sexual activity: No   Other Topics Concern    . 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 Never used smokeless tobacco.   No e-cigarettes   Occasional alcohol consumption socially.   Single, no children.     reports that he has never smoked. He has never used smokeless tobacco. He reports that he drinks alcohol. He reports that he does not use drugs.  Family History  Problem Relation Age of Onset  . Cancer Father    Family Status  Relation Status  . Mother Deceased  . Father Deceased     There is no immunization history on file for this patient.  Allergies  Allergen Reactions  . Bee Venom Swelling    Medications: Patient's Medications  New Prescriptions   No medications on file  Previous Medications   CHLORHEXIDINE (PERIDEX) 0.12 % SOLUTION    15 mLs by Mouth Rinse route 2 (two) times daily.   DRONABINOL (MARINOL) 5 MG CAPSULE    Take 1 capsule (5 mg total) by mouth daily before lunch.   LIDOCAINE-PRILOCAINE (EMLA) CREAM    Apply to affected area once   LORAZEPAM (ATIVAN) 0.5 MG TABLET    Take 1 tablet (0.5 mg total) by mouth every 6 (six) hours as needed (Nausea or vomiting).   NUTRITIONAL SUPPLEMENTS (NUTRITIONAL SUPPLEMENT PO)    House 2.0 two times daily for supplement   OLANZAPINE ZYDIS (ZYPREXA) 5 MG DISINTEGRATING TABLET    Take 0.5 tablets (2.5 mg total) by mouth at bedtime.   OXYCODONE (OXY IR/ROXICODONE) 5 MG IMMEDIATE RELEASE TABLET    Take 1 tablet (5 mg total) by mouth every 4 (four) hours as needed for moderate pain or severe pain.   PANTOPRAZOLE (PROTONIX) 40 MG TABLET    Take 1 tablet (40 mg total) by mouth daily at 12 noon.   POLYETHYLENE GLYCOL (MIRALAX / GLYCOLAX) PACKET    Take 17 g by mouth daily as needed for mild constipation.   POTASSIUM PHOSPHATE, MONOBASIC, (K-PHOS) 500 MG TABLET    Take 1 tablet (500 mg total) by mouth 2 (two) times daily with a meal.  Modified Medications   No medications on file  Discontinued  Medications   AMOXICILLIN-CLAVULANATE (AUGMENTIN) 875-125 MG TABLET    Take 1 tablet by mouth every 12 (twelve) hours.    Review of Systems  Constitutional: Positive for appetite change and fatigue.  Musculoskeletal: Positive for back pain.  Psychiatric/Behavioral: Positive for dysphoric mood and sleep disturbance. The patient is nervous/anxious.   All other systems reviewed and are negative.   Vitals:   09/06/17 1033  BP: 122/78  Pulse: (!) 111  Resp: 20  Temp: 100 F (37.8 C)  SpO2: 95%  Weight: 145 lb (65.8 kg)  Height: 6' 2"  (1.88 m)   Body mass index is 18.62 kg/m.  Physical Exam  Constitutional: He is oriented to person, place, and time. He appears well-developed.  Frail appearing, Lying in bed in NAD, looks  ill  HENT:  Mouth/Throat: Oropharynx is clear and moist.  MM dry  Eyes: Pupils are equal, round, and reactive to light. No scleral icterus.  Neck: Neck supple. Carotid bruit is not present. No thyromegaly present.  Cardiovascular: Regular rhythm and intact distal pulses.  Tachycardia present.  Exam reveals no gallop and no friction rub.   Murmur (1/6 SEM) heard. No distal LE edema. No calf TTP  Pulmonary/Chest: Effort normal and breath sounds normal. He has no wheezes. He has no rales. He exhibits no tenderness.  Right ACW port-a-cath present. Reduced BS at base b/l  Abdominal: Soft. Normal appearance and bowel sounds are normal. He exhibits no distension, no abdominal bruit, no pulsatile midline mass and no mass. There is no hepatomegaly. There is no tenderness. There is no rigidity, no rebound and no guarding. No hernia.  Right nephrostomy tube intact and DTG dark orange drainage with some sediment  Musculoskeletal: He exhibits edema.  Lymphadenopathy:    He has no cervical adenopathy.  Neurological: He is alert and oriented to person, place, and time.  Skin: Skin is warm and dry. No rash noted.  Superficial on back - followed by facility wound care    Psychiatric: He has a normal mood and affect. His behavior is normal. Judgment and thought content normal.     Labs reviewed: Admission on 08/21/2017, Discharged on 09/03/2017  No results displayed because visit has over 200 results.  CBC Latest Ref Rng & Units 09/06/2017 09/03/2017 09/02/2017  WBC 4.0 - 10.3 10e3/uL 7.4 6.3 5.7  Hemoglobin 13.0 - 17.1 g/dL 10.4(L) 10.0(L) 9.9(L)  Hematocrit 38.4 - 49.9 % 30.8(L) 29.5(L) 29.9(L)  Platelets 140 - 400 10e3/uL 543(H) 403(H) 343   CMP Latest Ref Rng & Units 09/06/2017 09/03/2017 09/02/2017  Glucose 70 - 140 mg/dl 99 103(H) 92  BUN 7.0 - 26.0 mg/dL 11.1 8 8   Creatinine 0.7 - 1.3 mg/dL 0.5(L) 0.38(L) <0.30(L)  Sodium 136 - 145 mEq/L 136 136 136  Potassium 3.5 - 5.1 mEq/L 2.8(LL) 3.1(L) 3.4(L)  Chloride 101 - 111 mmol/L - 101 104  CO2 22 - 29 mEq/L 21(L) 20(L) 22  Calcium 8.4 - 10.4 mg/dL 7.1(L) 6.8(L) 7.1(L)  Total Protein 6.4 - 8.3 g/dL 6.4 - -  Total Bilirubin 0.20 - 1.20 mg/dL 1.14 - -  Alkaline Phos 40 - 150 U/L 141 - -  AST 5 - 34 U/L 15 - -  ALT 0 - 55 U/L 10 - -   Lipid Panel  No results found for: CHOL, TRIG, HDL, CHOLHDL, VLDL, LDLCALC, LDLDIRECT  No results found for: HGBA1C   Appointment on 08/16/2017  Component Date Value Ref Range Status  . WBC 08/16/2017 2.7* 4.0 - 10.3 10e3/uL Final  . NEUT# 08/16/2017 2.6  1.5 - 6.5 10e3/uL Final  . HGB 08/16/2017 11.0* 13.0 - 17.1 g/dL Final  . HCT 08/16/2017 32.3* 38.4 - 49.9 % Final  . Platelets 08/16/2017 158  140 - 400 10e3/uL Final  . MCV 08/16/2017 73.9* 79.3 - 98.0 fL Final  . MCH 08/16/2017 25.1* 27.2 - 33.4 pg Final  . MCHC 08/16/2017 34.0  32.0 - 36.0 g/dL Final  . RBC 08/16/2017 4.37  4.20 - 5.82 10e6/uL Final  . RDW 08/16/2017 17.7* 11.0 - 14.6 % Final  . lymph# 08/16/2017 0.1* 0.9 - 3.3 10e3/uL Final  . MONO# 08/16/2017 0.0* 0.1 - 0.9 10e3/uL Final  . Eosinophils Absolute 08/16/2017 0.0  0.0 - 0.5 10e3/uL Final  . Basophils Absolute 08/16/2017 0.0  0.0 - 0.1 10e3/uL  Final  . NEUT% 08/16/2017 93.9* 39.0 - 75.0 % Final  . LYMPH% 08/16/2017 3.8* 14.0 - 49.0 % Final  . MONO% 08/16/2017 0.5  0.0 - 14.0 % Final  . EOS% 08/16/2017 1.6  0.0 - 7.0 % Final  . BASO% 08/16/2017 0.2  0.0 - 2.0 % Final  . Sodium 08/16/2017 135* 136 - 145 mEq/L Final  . Potassium 08/16/2017 3.6  3.5 - 5.1 mEq/L Final  . Chloride 08/16/2017 101  98 - 109 mEq/L Final  . CO2 08/16/2017 25  22 - 29 mEq/L Final  . Glucose 08/16/2017 83  70 - 140 mg/dl Final   Glucose reference range is for nonfasting patients. Fasting glucose reference range is 70- 100.  Marland Kitchen BUN 08/16/2017 10.4  7.0 - 26.0 mg/dL Final  . Creatinine 08/16/2017 0.6* 0.7 - 1.3 mg/dL Final  . Total Bilirubin 08/16/2017 0.89  0.20 - 1.20 mg/dL Final  . Alkaline Phosphatase 08/16/2017 110  40 - 150 U/L Final  . AST 08/16/2017 27  5 - 34 U/L Final  . ALT 08/16/2017 59* 0 - 55 U/L Final  . Total Protein 08/16/2017 6.4  6.4 - 8.3 g/dL Final  . Albumin 08/16/2017 3.4* 3.5 - 5.0 g/dL Final  . Calcium 08/16/2017 8.1* 8.4 - 10.4 mg/dL Final  . Anion Gap 08/16/2017 8  3 - 11 mEq/L Final  . EGFR 08/16/2017 >90  >90 ml/min/1.73 m2 Final   eGFR is calculated using the CKD-EPI Creatinine Equation (2009)  . Uric Acid, Serum 08/16/2017 <2.0* 2.6 - 7.4 mg/dl Final  . Phosphorus, Ser 08/16/2017 1.2* 2.5 - 4.5 mg/dL Final   **Verified by repeat analysis**  . Magnesium 08/16/2017 2.3  1.5 - 2.5 mg/dl Final  Appointment on 08/08/2017  Component Date Value Ref Range Status  . WBC 08/08/2017 4.9  4.0 - 10.3 10e3/uL Final  . NEUT# 08/08/2017 3.6  1.5 - 6.5 10e3/uL Final  . HGB 08/08/2017 12.2* 13.0 - 17.1 g/dL Final  . HCT 08/08/2017 36.6* 38.4 - 49.9 % Final  . Platelets 08/08/2017 255  140 - 400 10e3/uL Final  . MCV 08/08/2017 76.3* 79.3 - 98.0 fL Final  . MCH 08/08/2017 25.4* 27.2 - 33.4 pg Final  . MCHC 08/08/2017 33.3  32.0 - 36.0 g/dL Final  . RBC 08/08/2017 4.80  4.20 - 5.82 10e6/uL Final  . RDW 08/08/2017 16.3* 11.0 - 14.6 % Final   . lymph# 08/08/2017 1.0  0.9 - 3.3 10e3/uL Final  . MONO# 08/08/2017 0.3  0.1 - 0.9 10e3/uL Final  . Eosinophils Absolute 08/08/2017 0.0  0.0 - 0.5 10e3/uL Final  . Basophils Absolute 08/08/2017 0.0  0.0 - 0.1 10e3/uL Final  . NEUT% 08/08/2017 73.0  39.0 - 75.0 % Final  . LYMPH% 08/08/2017 20.6  14.0 - 49.0 % Final  . MONO% 08/08/2017 6.0  0.0 - 14.0 % Final  . EOS% 08/08/2017 0.4  0.0 - 7.0 % Final  . BASO% 08/08/2017 0.0  0.0 - 2.0 % Final  . Sodium 08/08/2017 137  136 - 145 mEq/L Final  . Potassium 08/08/2017 3.5  3.5 - 5.1 mEq/L Final  . Chloride 08/08/2017 100  98 - 109 mEq/L Final  . CO2 08/08/2017 27  22 - 29 mEq/L Final  . Glucose 08/08/2017 101  70 - 140 mg/dl Final   Glucose reference range is for nonfasting patients. Fasting glucose reference range is 70- 100.  Marland Kitchen BUN 08/08/2017 12.3  7.0 - 26.0 mg/dL Final  .  Creatinine 08/08/2017 0.8  0.7 - 1.3 mg/dL Final  . Total Bilirubin 08/08/2017 0.85  0.20 - 1.20 mg/dL Final  . Alkaline Phosphatase 08/08/2017 106  40 - 150 U/L Final  . AST 08/08/2017 22  5 - 34 U/L Final  . ALT 08/08/2017 22  0 - 55 U/L Final  . Total Protein 08/08/2017 6.9  6.4 - 8.3 g/dL Final  . Albumin 08/08/2017 3.4* 3.5 - 5.0 g/dL Final  . Calcium 08/08/2017 10.6* 8.4 - 10.4 mg/dL Final  . Anion Gap 08/08/2017 10  3 - 11 mEq/L Final  . EGFR 08/08/2017 >90  >90 ml/min/1.73 m2 Final   eGFR is calculated using the CKD-EPI Creatinine Equation (2009)  . Uric Acid, Serum 08/08/2017 3.7  2.6 - 7.4 mg/dl Final  . Magnesium 08/08/2017 2.1  1.5 - 2.5 mg/dl Final  . Phosphorus, Ser 08/08/2017 2.8  2.5 - 4.5 mg/dL Final  . Hep B E Ag 08/08/2017 Negative  Negative Final  . HBV DNA SERPL PCR-ACNC 08/08/2017 HBV DNA not detected  IU/mL Final  . HBV DNA SERPL PCR-LOG IU 08/08/2017 Test Not Performed.  log10 IU/mL Final   Comment: Unable to calculate result since non-numeric result obtained for component test.   . Test Information: 08/08/2017 Comment   Final   The  reportable range for this assay is 10 IU/mL to 1 billion IU/mL.  Marland Kitchen Hep B Core Ab, IgM 08/08/2017 Negative  Negative Final  . LDH 08/08/2017 309* 125 - 245 U/L Final  . Technologist Review 08/08/2017 Rare meta   Final  Hospital Outpatient Visit on 07/26/2017  Component Date Value Ref Range Status  . aPTT 07/26/2017 29  24 - 36 seconds Final  . Sodium 07/26/2017 130* 135 - 145 mmol/L Final  . Potassium 07/26/2017 3.5  3.5 - 5.1 mmol/L Final  . Chloride 07/26/2017 94* 101 - 111 mmol/L Final  . CO2 07/26/2017 25  22 - 32 mmol/L Final  . Glucose, Bld 07/26/2017 92  65 - 99 mg/dL Final  . BUN 07/26/2017 18  6 - 20 mg/dL Final  . Creatinine, Ser 07/26/2017 0.68  0.61 - 1.24 mg/dL Final  . Calcium 07/26/2017 9.4  8.9 - 10.3 mg/dL Final  . GFR calc non Af Amer 07/26/2017 >60  >60 mL/min Final  . GFR calc Af Amer 07/26/2017 >60  >60 mL/min Final   Comment: (NOTE) The eGFR has been calculated using the CKD EPI equation. This calculation has not been validated in all clinical situations. eGFR's persistently <60 mL/min signify possible Chronic Kidney Disease.   . Anion gap 07/26/2017 11  5 - 15 Final  . WBC 07/26/2017 5.2  4.0 - 10.5 K/uL Final  . RBC 07/26/2017 4.87  4.22 - 5.81 MIL/uL Final  . Hemoglobin 07/26/2017 12.3* 13.0 - 17.0 g/dL Final  . HCT 07/26/2017 36.1* 39.0 - 52.0 % Final  . MCV 07/26/2017 74.1* 78.0 - 100.0 fL Final  . MCH 07/26/2017 25.3* 26.0 - 34.0 pg Final  . MCHC 07/26/2017 34.1  30.0 - 36.0 g/dL Final  . RDW 07/26/2017 15.6* 11.5 - 15.5 % Final  . Platelets 07/26/2017 179  150 - 400 K/uL Final  . Prothrombin Time 07/26/2017 14.5  11.4 - 15.2 seconds Final  . INR 07/26/2017 1.13   Final  Appointment on 07/16/2017  Component Date Value Ref Range Status  . WBC 07/16/2017 7.0  4.0 - 10.3 10e3/uL Final  . NEUT# 07/16/2017 6.1  1.5 - 6.5 10e3/uL Final  . HGB  07/16/2017 13.1  13.0 - 17.1 g/dL Final  . HCT 07/16/2017 38.4  38.4 - 49.9 % Final  . Platelets 07/16/2017 173   140 - 400 10e3/uL Final  . MCV 07/16/2017 75.9* 79.3 - 98.0 fL Final  . MCH 07/16/2017 25.9* 27.2 - 33.4 pg Final  . MCHC 07/16/2017 34.1  32.0 - 36.0 g/dL Final  . RBC 07/16/2017 5.06  4.20 - 5.82 10e6/uL Final  . RDW 07/16/2017 15.5* 11.0 - 14.6 % Final  . lymph# 07/16/2017 0.6* 0.9 - 3.3 10e3/uL Final  . MONO# 07/16/2017 0.3  0.1 - 0.9 10e3/uL Final  . Eosinophils Absolute 07/16/2017 0.0  0.0 - 0.5 10e3/uL Final  . Basophils Absolute 07/16/2017 0.0  0.0 - 0.1 10e3/uL Final  . NEUT% 07/16/2017 86.8* 39.0 - 75.0 % Final  . LYMPH% 07/16/2017 8.2* 14.0 - 49.0 % Final  . MONO% 07/16/2017 4.4  0.0 - 14.0 % Final  . EOS% 07/16/2017 0.6  0.0 - 7.0 % Final  . BASO% 07/16/2017 0.0  0.0 - 2.0 % Final  . Sodium 07/16/2017 132* 136 - 145 mEq/L Final  . Potassium 07/16/2017 3.9  3.5 - 5.1 mEq/L Final  . Chloride 07/16/2017 96* 98 - 109 mEq/L Final  . CO2 07/16/2017 27  22 - 29 mEq/L Final  . Glucose 07/16/2017 95  70 - 140 mg/dl Final   Glucose reference range is for nonfasting patients. Fasting glucose reference range is 70- 100.  Marland Kitchen BUN 07/16/2017 25.2  7.0 - 26.0 mg/dL Final  . Creatinine 07/16/2017 0.9  0.7 - 1.3 mg/dL Final  . Total Bilirubin 07/16/2017 1.27* 0.20 - 1.20 mg/dL Final  . Alkaline Phosphatase 07/16/2017 173* 40 - 150 U/L Final  . AST 07/16/2017 44* 5 - 34 U/L Final  . ALT 07/16/2017 148* 0 - 55 U/L Final  . Total Protein 07/16/2017 7.1  6.4 - 8.3 g/dL Final  . Albumin 07/16/2017 3.7  3.5 - 5.0 g/dL Final  . Calcium 07/16/2017 10.0  8.4 - 10.4 mg/dL Final  . Anion Gap 07/16/2017 9  3 - 11 mEq/L Final  . EGFR 07/16/2017 >90  >90 ml/min/1.73 m2 Final   eGFR is calculated using the CKD-EPI Creatinine Equation (2009)  . LDH 07/16/2017 193  125 - 245 U/L Final  . Uric Acid, Serum 07/16/2017 6.1  2.6 - 7.4 mg/dl Final  . Magnesium 07/16/2017 2.2  1.5 - 2.5 mg/dl Final  . Phosphorus, Ser 07/16/2017 2.8  2.5 - 4.5 mg/dL Final  . Hep C Virus Ab 07/16/2017 0.1  0.0 - 0.9 s/co ratio  Final   Comment:                                                  Negative:     < 0.8                                             Indeterminate: 0.8 - 0.9  Positive:     > 0.9                 The CDC recommends that a positive HCV antibody result                 be followed up with a HCV Nucleic Acid Amplification                 test (300762).   . Hep B Surface Ab, Qual 07/16/2017 Reactive   Final   Comment:                              Non Reactive: Inconsistent with immunity,                                            less than 10 mIU/mL                              Reactive:     Consistent with immunity,                                            greater than 9.9 mIU/mL   . HBsAg Screen 07/16/2017 Negative  Negative Final  . Hep B Core Ab, Tot 07/16/2017 Positive* Negative Final  Office Visit on 07/06/2017  Component Date Value Ref Range Status  . Glucose, UA 07/06/2017 NEGATIVE  NEGATIVE mg/dL Final  . Bilirubin Urine 07/06/2017 SMALL* NEGATIVE Final  . Ketones, ur 07/06/2017 15* NEGATIVE mg/dL Final  . Specific Gravity, Urine 07/06/2017 1.025  1.005 - 1.030 Final  . Hgb urine dipstick 07/06/2017 SMALL* NEGATIVE Final  . pH 07/06/2017 5.5  5.0 - 8.0 Final  . Protein, ur 07/06/2017 NEGATIVE  NEGATIVE mg/dL Final  . Urobilinogen, UA 07/06/2017 1.0  0.0 - 1.0 mg/dL Final  . Nitrite 07/06/2017 NEGATIVE  NEGATIVE Final  . Leukocytes, UA 07/06/2017 NEGATIVE  NEGATIVE Final   Biochemical Testing Only. Please order routine urinalysis from main lab if confirmatory testing is needed.  Admission on 06/28/2017, Discharged on 07/03/2017  Component Date Value Ref Range Status  . Sodium 06/28/2017 138  135 - 145 mmol/L Final  . Potassium 06/28/2017 4.1  3.5 - 5.1 mmol/L Final  . Chloride 06/28/2017 104  101 - 111 mmol/L Final  . CO2 06/28/2017 23  22 - 32 mmol/L Final  . Glucose, Bld 06/28/2017 108* 65 - 99 mg/dL Final  . BUN 06/28/2017 14  6  - 20 mg/dL Final  . Creatinine, Ser 06/28/2017 0.84  0.61 - 1.24 mg/dL Final  . Calcium 06/28/2017 9.1  8.9 - 10.3 mg/dL Final  . Total Protein 06/28/2017 7.6  6.5 - 8.1 g/dL Final  . Albumin 06/28/2017 3.9  3.5 - 5.0 g/dL Final  . AST 06/28/2017 45* 15 - 41 U/L Final  . ALT 06/28/2017 30  17 - 63 U/L Final  . Alkaline Phosphatase 06/28/2017 101  38 - 126 U/L Final  . Total Bilirubin 06/28/2017 1.6* 0.3 - 1.2 mg/dL Final  . GFR calc non Af Amer 06/28/2017 >60  >60 mL/min Final  . GFR calc Af Amer 06/28/2017 >60  >60 mL/min Final  Comment: (NOTE) The eGFR has been calculated using the CKD EPI equation. This calculation has not been validated in all clinical situations. eGFR's persistently <60 mL/min signify possible Chronic Kidney Disease.   . Anion gap 06/28/2017 11  5 - 15 Final  . WBC 06/28/2017 7.8  4.0 - 10.5 K/uL Final  . RBC 06/28/2017 5.42  4.22 - 5.81 MIL/uL Final  . Hemoglobin 06/28/2017 14.1  13.0 - 17.0 g/dL Final  . HCT 06/28/2017 40.7  39.0 - 52.0 % Final  . MCV 06/28/2017 75.1* 78.0 - 100.0 fL Final  . MCH 06/28/2017 26.0  26.0 - 34.0 pg Final  . MCHC 06/28/2017 34.6  30.0 - 36.0 g/dL Final  . RDW 06/28/2017 14.7  11.5 - 15.5 % Final  . Platelets 06/28/2017 332  150 - 400 K/uL Final  . Neutrophils Relative % 06/28/2017 73  % Final  . Neutro Abs 06/28/2017 5.7  1.7 - 7.7 K/uL Final  . Lymphocytes Relative 06/28/2017 13  % Final  . Lymphs Abs 06/28/2017 1.0  0.7 - 4.0 K/uL Final  . Monocytes Relative 06/28/2017 13  % Final  . Monocytes Absolute 06/28/2017 1.0  0.1 - 1.0 K/uL Final  . Eosinophils Relative 06/28/2017 1  % Final  . Eosinophils Absolute 06/28/2017 0.1  0.0 - 0.7 K/uL Final  . Basophils Relative 06/28/2017 0  % Final  . Basophils Absolute 06/28/2017 0.0  0.0 - 0.1 K/uL Final  . Color, Urine 06/28/2017 YELLOW  YELLOW Final  . APPearance 06/28/2017 CLEAR  CLEAR Final  . Specific Gravity, Urine 06/28/2017 1.020  1.005 - 1.030 Final  . pH 06/28/2017 5.0   5.0 - 8.0 Final  . Glucose, UA 06/28/2017 NEGATIVE  NEGATIVE mg/dL Final  . Hgb urine dipstick 06/28/2017 SMALL* NEGATIVE Final  . Bilirubin Urine 06/28/2017 NEGATIVE  NEGATIVE Final  . Ketones, ur 06/28/2017 NEGATIVE  NEGATIVE mg/dL Final  . Protein, ur 06/28/2017 NEGATIVE  NEGATIVE mg/dL Final  . Nitrite 06/28/2017 NEGATIVE  NEGATIVE Final  . Leukocytes, UA 06/28/2017 NEGATIVE  NEGATIVE Final  . RBC / HPF 06/28/2017 6-30  0 - 5 RBC/hpf Final  . WBC, UA 06/28/2017 6-30  0 - 5 WBC/hpf Final  . Bacteria, UA 06/28/2017 RARE* NONE SEEN Final  . Squamous Epithelial / LPF 06/28/2017 0-5* NONE SEEN Final  . Mucus 06/28/2017 PRESENT   Final  . CEA 06/28/2017 0.7  0.0 - 4.7 ng/mL Final   Comment: (NOTE)       Roche ECLIA methodology       Nonsmokers  <3.9                                     Smokers     <5.6 Performed At: [facility] [place], [place] [id number] Lindon Romp MD SL:[id number]   . LDH 06/28/2017 461* 98 - 192 U/L Final  . CA 19-9 06/28/2017 23  0 - 35 U/mL Final   Comment: (NOTE) Roche ECLIA methodology Performed At: [facility] [place], [place] [id number] Lindon Romp MD DH:[id number]   . Beta-2 Microglobulin 06/28/2017 2.2  0.6 - 2.4 mg/L Final   Comment: (NOTE) Siemens Immulite 2000 Immunochemiluminometric assay [facility]) Performed At: [facility] [place], [place] [id number] Lindon Romp MD [id number]   . HIV Screen 4th Generation wRfx 06/28/2017 Non Reactive  Non Reactive Final   Comment: (NOTE)  Performed At: [facility] [place], [place] [id number] Lindon Romp MD [id number]   . Sodium 06/29/2017 139  135 - 145 mmol/L Final  . Potassium 06/29/2017 4.1  3.5 - 5.1 mmol/L Final  . Chloride 06/29/2017 103  101 - 111 mmol/L Final  . CO2 06/29/2017 27  22 - 32 mmol/L Final  . Glucose, Bld 06/29/2017 130* 65 - 99 mg/dL Final  . BUN 06/29/2017 20  6 - 20  mg/dL Final  . Creatinine, Ser 06/29/2017 0.70  0.61 - 1.24 mg/dL Final  . Calcium 06/29/2017 10.0  8.9 - 10.3 mg/dL Final  . GFR calc non Af Amer 06/29/2017 >60  >60 mL/min Final  . GFR calc Af Amer 06/29/2017 >60  >60 mL/min Final   Comment: (NOTE) The eGFR has been calculated using the CKD EPI equation. This calculation has not been validated in all clinical situations. eGFR's persistently <60 mL/min signify possible Chronic Kidney Disease.   . Anion gap 06/29/2017 9  5 - 15 Final  . WBC 06/29/2017 6.7  4.0 - 10.5 K/uL Final  . RBC 06/29/2017 5.38  4.22 - 5.81 MIL/uL Final  . Hemoglobin 06/29/2017 13.7  13.0 - 17.0 g/dL Final  . HCT 06/29/2017 40.4  39.0 - 52.0 % Final  . MCV 06/29/2017 75.1* 78.0 - 100.0 fL Final  . MCH 06/29/2017 25.5* 26.0 - 34.0 pg Final  . MCHC 06/29/2017 33.9  30.0 - 36.0 g/dL Final  . RDW 06/29/2017 15.1  11.5 - 15.5 % Final  . Platelets 06/29/2017 354  150 - 400 K/uL Final  . Prostatic Specific Antigen 06/29/2017 3.23  0.00 - 4.00 ng/mL Final   Comment: (NOTE) While PSA levels of <=4.0 ng/ml are reported as reference range, some men with levels below 4.0 ng/ml can have prostate cancer and many men with PSA above 4.0 ng/ml do not have prostate cancer.  Other tests such as free PSA, age specific reference ranges, PSA velocity and PSA doubling time may be helpful especially in men less than 91 years old. Performed at [facility], [place] [address] [place], [place] [address]   . Prothrombin Time 06/29/2017 13.8  11.4 - 15.2 seconds Final  . INR 06/29/2017 1.06   Final    Dg Chest 2 View  Result Date: 08/21/2017 CLINICAL DATA:  Dizzy in short of breath EXAM: CHEST  2 VIEW COMPARISON:  06/28/2017 FINDINGS: Right jugular Port-A-Cath placed with its tip at the cavoatrial junction. No pneumothorax. Lungs are hyperaerated and clear. Normal heart size. No pleural effusion. IMPRESSION: No active cardiopulmonary disease. Electronically Signed   By: Marybelle Killings M.D.   On: 08/21/2017 13:50   Dg Thoracic Spine 2 View  Result Date: 08/21/2017 CLINICAL DATA:  Pain. History of lower B-cell non-Hodgkin's lymphoma EXAM: THORACIC SPINE 3 VIEWS COMPARISON:  MR thoracic spine June 28, 2017 and chest radiograph August 21, 2017 FINDINGS: Frontal, lateral, and swimmer's views obtained. There is no evident acute fracture or spondylolisthesis. The pedicle at T12 on the right is no longer apparent and is presumably destroyed by neoplasm. This finding was evident on prior MR. there is subtle loss of detail along the lateral left T8 pedicle, noted by prior MR to be involved with tumor. There is no obvious neoplastic involvement vertebral bodies by radiography. MR shows tumor involving several vertebral bodies. IMPRESSION: No fracture or spondylolisthesis. There are multiple metastatic foci seen in the thoracic spine performed three months prior. By radiography, there is  loss of the pedicle of T12 on the right and subtle loss of the lateral aspect of the pedicle of T8 on the left. 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 No other neoplastic foci identified by radiography the bony structures. Note that there are multiple foci of tumor and thoracic vertebral bodies seen on recent MR not seen by radiography currently. Electronically Signed   By: Lowella Grip III M.D.   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 Electronically Signed   By: Lowella Grip III M.D.   On: 08/21/2017 16:02   Result Date: 08/21/2017 CLINICAL DATA:  Pain. History of large B-cell non-Hodgkin's lymphoma EXAM: LUMBAR SPINE - COMPLETE 4+ VIEW COMPARISON:  Lumbar MRI June 28, 2017 FINDINGS: Frontal, lateral, spot lumbosacral lateral, and bilateral oblique views were obtained. There are 5  non-rib-bearing lumbar type vertebral bodies. There is a stable fracture of the L3 vertebral body, more severe along the leftward aspect. There is lucency in this area suggesting metastatic focus as likely etiology for the fracture. There is apparent destruction in the region of the pedicle on the left at L3. No other fracture evident. No spondylolisthesis. Disc spaces appear normal. No other lytic or destructive lesions. There is a probable bone island medial upper right iliac crest. There is facet osteoarthritic change at L4-5 and L5-S1 bilaterally. IMPRESSION: There is again noted fracture L3 vertebral body. Tumor extends into and involves the left L3 pedicle. This finding was present on prior lumbar MR. No new bone lesions are evident. No new fracture. No spondylolisthesis. There is facet osteoarthritic change at L4-5 L5-S1 bilaterally. Electronically Signed: By: Lowella Grip III M.D. On: 08/21/2017 15:52   Dg C-arm 1-60 Min-no Report  Result Date: 08/21/2017 Fluoroscopy was utilized by the requesting physician.  No radiographic interpretation.   Ir Nephrostomy Placement Right  Result Date: 08/22/2017 INDICATION: History of lymphoma, admitted with neutropenic fever in urosepsis secondary to right-sided ureteral stone. Post failed attempted right-sided ureteral stent placement. As such, request made for placement of a right-sided percutaneous nephrostomy catheter for infection source control purposes. EXAM: 1. ULTRASOUND GUIDANCE FOR PUNCTURE OF THE RIGHT RENAL COLLECTING SYSTEM 2. RIGHT PERCUTANEOUS NEPHROSTOMY TUBE PLACEMENT. COMPARISON:  CT abdomen pelvis - 07/30/2017; right-sided retrograde pyelogram and attempted stent placement - earlier same day MEDICATIONS: The patient is currently admitted to the hospital and receiving intravenous antibiotics.;The antibiotic was administered in an appropriate time frame prior to skin puncture. ANESTHESIA/SEDATION: Moderate (conscious) sedation was employed  during this procedure. A total of Versed 1 mg and Fentanyl 75 mcg was administered intravenously. Moderate Sedation Time: 5 minutes. The patient's level of consciousness and vital signs were monitored continuously by radiology nursing throughout the procedure under my direct supervision. CONTRAST:  20 mL Isovue 300 administered into the collecting system FLUOROSCOPY TIME:  1 minute 6 seconds (26.3 mGy) COMPLICATIONS: None immediate. PROCEDURE: The procedure, risks, benefits, and alternatives were explained to the patient. Questions regarding the procedure were encouraged and answered. The patient understands and consents to the procedure. A timeout was performed prior to the initiation of the procedure. The right flank region was prepped with Betadine in a sterile fashion, and a sterile drape was applied covering the operative field. A sterile gown and sterile gloves were used for the procedure. Local  anesthesia was provided with 1% Lidocaine with epinephrine. Ultrasound was used to localize the right kidney. Under direct ultrasound guidance, a 21 gauge needle was advanced into the renal collecting system. An ultrasound image documentation was performed. Access within the collecting system was confirmed with the efflux of urine followed by contrast injection. Over a Nitrex wire, the inner three [place] catheter of an Accustick set was advanced into the renal collecting system. Contrast injection was injected into the collecting system as several spot radiographs were obtained in various obliquities confirming puncture within a posterior inferior calix. As such, the tract was dilated with an Accustick stent. Over a guide wire, a 10-French percutaneous nephrostomy catheter was advanced into the collecting system where the coil was formed and locked. Contrast was injected and several sport radiographs were obtained in various obliquities confirming access. The catheter was secured at the skin with a Prolene retention  suture and a gravity bag was placed. A dressing was placed. The patient tolerated procedure well without immediate postprocedural complication. FINDINGS: Ultrasound scanning demonstrates a moderately dilated right renal collecting system. Under direct ultrasound guidance, a posterior inferior calix was targeted allowing advancement of an 10-French percutaneous nephrostomy catheter under intermittent fluoroscopic guidance. Contrast injection confirmed appropriate positioning. Note is again made of an occlusive stone within the distal aspect of the right ureter. IMPRESSION: Successful ultrasound and fluoroscopic guided placement of a right sided 10 French PCN. This stone may be use for subsequent percutaneous nephrolithotomy at a later date as clinically indicated. Electronically Signed   By: Sandi Mariscal M.D.   On: 08/22/2017 07:59     Assessment/Plan   ICD-10-CM   1. Electrolyte disturbance E87.8   2. Adjustment disorder with mixed anxiety and depressed mood F43.23   3. 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 Nephrostomy status ([place]) Z93.6    right; 2/2 multiple renal/ureteral stones  5. Bony metastasis (HCC) C79.51    2/2 #3  6. Gastroesophageal reflux disease without esophagitis K21.9   7. Severe protein-calorie malnutrition ([place]) E43       Change potassium to elixir per pt request  Cont other meds as ordered. May need to adjust psych meds accordingly  Follow electrolytes closely  F/u with oncology as scheduled  PT/OT/St as ordered  Nutritional supplements as ordered  Pain control  GOAL: short term rehab and d/c home when medically appropriate. Communicated with pt and nursing.  Will follow  Mihaela Fajardo S. Perlie Gold  [facility] [address] [place], [place] [address] [phone] Cell (Monday-Friday 8 AM - 5 PM) [phone] After 5 PM and follow prompts

## 2017-09-06 NOTE — Telephone Encounter (Signed)
Unable to schedule 10/4 los - due to capped day - message sent to [facility] and [facility] for approval. Patient is aware.

## 2017-09-06 NOTE — Telephone Encounter (Signed)
"  This is Cory Ibarra with Radiology calling to notify Dr. [name] it is correct that the intrathecal injection Radiology would like to perform cannot be performed as Short Stay cannot accommodate until the end of the month.  We're reaching out to [employer] but ordering provider may need to as well."  Routing call information to collaborative nurse and provider for review.  Further patient communication through collaborative nurse.

## 2017-09-06 NOTE — Telephone Encounter (Signed)
Scheduled appt per 10/4 los - per Rn samantha okay to schedule on 10/12 or 10/15 due to IT MTX schedule - left message with appt date and time.

## 2017-09-07 LAB — HEPATITIS B CORE ANTIBODY, IGM: HEP B C IGM: NEGATIVE

## 2017-09-07 LAB — HEPATITIS B SURFACE ANTIGEN: HEP B S AG: NEGATIVE

## 2017-09-07 LAB — HEPATITIS B E ANTIGEN: Hep B E Ag: NEGATIVE

## 2017-09-07 LAB — PHOSPHORUS: Phosphorus, Ser: 1.4 mg/dL — ABNORMAL LOW (ref 2.5–4.5)

## 2017-09-10 ENCOUNTER — Encounter: Payer: Self-pay | Admitting: Hematology and Oncology

## 2017-09-10 NOTE — Progress Notes (Signed)
[facility] Follow-up Visit:  Assessment: Diffuse large B cell lymphoma ([name]) 56 year old male with diagnosis of diffuse large B-cell lymphoma, GCB subtype involving multifocal skeletal sites and multiple node regions. Initial presentation with severe left hip and knee due to extensive disease involving the skeletal structures of the spine with multilevel compression fractures. Some spinal canal intrusion without obvious cord compression was noted. Additionally, bulky retroperitoneal lymphadenopathy and a soft tissue mass in the thoracic wall were detected. Based on presence of extensive skeletal disease and soft tissue mass in the chest wall, patient is Stage IVB.  Patient was initially treated with steroids and palliative radiation to the spine which resulted in significant symptomatic improvement. Patient has recovered from his radiation therapy. Pretreatment workup included echocardiogram obtained on 07/19/17 demonstrating left ventricular ejection fraction of 50-55%, patient underwent lab work testing which returned back positive for core antibodies for hepatitis B virus. Additional testing was negative for IgM antibodies and negative for hepatitis B DNA. These findings are consistent with either previous infection for which the patient has completely recovered, or previous history of immunization patient does not recall having either.   Following initiation of systemic chemotherapy, patient was admitted to the hospital with septic shock due to urinary tract infection in the setting off obstruction of the renal drainage by kidney stone. Patient responded to stress dose steroids, pressor support, broad-spectrum antibiotics, and volume resuscitation. Has been able to maintain his own pressures after several days and demonstrated resumed adrenal function. We have retested here for hepatitis B and did not detect any signs of infection reactivation.  Nevertheless, patient demonstrated  rapid and profound loss of appetite resulting in severe calorie/protein malnutrition, dramatic decrease in his activity tolerance going from ECOG 2 down to ECOG 4. Currently, he remains ECOG 4, pain is a significant component of the physical limitation.   Plan: --Continue Allopurinol 356m PO QDay --Continue Acyclovir  --On RTC: Labs, clinic visit, possible Cycle #2/6 of R-CHOP with intrathecal methotrexate for CNS relapse prophylaxis -- we will be forced to decrease the [name] dosing and will deliver the next cycle as a Mini-CHOP combination that has been shown to be reasonably effective with significant reduction in toxicity in elderly patients. --Denosumab therapy Q6wks due to extensive skeletal involvement -- we'll receive the next dose on Thursday   Voice recognition software was used and creation of this note. Despite my best effort at editing the text, some misspelling/errors may have occurred.  No orders of the defined types were placed in this encounter.   Cancer Staging Diffuse large B cell lymphoma ([place] Staging form: Hodgkin and Non-Hodgkin Lymphoma, AJCC 8th Edition - Clinical stage from 07/16/2017: Stage IV (Diffuse large B-cell lymphoma) - Signed by PArdath Sax MD on 07/18/2017   All questions were answered.  . The patient knows to call the clinic with any problems, questions or concerns.  This note was electronically signed.    History of Presenting Illness Cory Ibarra a 56y[place]o. [place]. Patient initially presented on 06/28/17 with progressive pain in the left hip and knee at least 6 week duration. This was accompanied by decreased appetite, decreased activity due to the pain. Patient was not closely followed by physician for an extended period of time due to lack of insurance.  Initially presented the week prior and was diagnosed with sciatica and treated with steroids. Steroids  resulted in symptomatic improvement, but  the pain recurred rapidly after completion of a taper. Approximately at the time of onset of the pain in the left lower extremity, patient will start noticing mild abdominal discomfort. This has been associated with loss of appetite for a month and some weight loss. You, dysphasia, early satiety, diarrhea, constipation, melena, or hematochezia. Initial imaging evaluation revealed multifocal lytic lesions in the skeletal structures as well as pelvic mass. Oncology was consulted and patient was seen by Dr Marjie Skiff. Based on his recommendations, additional imaging was obtained, patient was started on steroids, neurosurgery and radiation oncology were consulted. Patient was started on palliative radiation therapy to the lumbar spine which resulted in significant symptomatic improvement.  Since the last visit to clinic, patient ended up admitted to the hospital after developing septic shock due to drainage of the left kidney being obstructed by a renal stone resulting in bacteremia and rapidly progressing infection. Responded well to pressure support, antibiotics, and IV fluids, but developed progressive loss of appetite, loss of desire to ambulate, and thus progressive weakness. Appetite is still not returning, patient is currently in rehabilitation facility, trying to work with physical therapy, but essentially bedbound at this point. Some with his due to severe musculoskeletal pain, but a psychological component has been suggested by other providers.   Oncological/hematological History:   Diffuse large B cell lymphoma ([place])   06/28/2017 Imaging    CT Renal protocol: Bilateral nephrolithiasis. 6.7 cm soft tissue mass in the left. Aortic location, additional 3.6 cm left. Aortic soft tissue mass. Mild prostatic enlargement. 5.6 cm soft tissue mass between the anterior right sixth and seventh ribs. Patchy lucencies in the T10, soft tissue mass in the right posterior  elements of T12 measuring 3.9 cm with associated osseous destruction. There is mass extension into the spinal canal. Small lucent lesion in the posterior L1. Patchy lucency throughout L3 with partial collapse of the vertebral body. Soft tissue mass extending into the canal. Bilateral pubic symphysis lesions.       06/28/2017 Imaging    MRI T/L Spine: Multiple osseous lesions involving T2, T6, T7 with the near complete involvement of the vertebral body, T8, T10 with almost complete replacement, T12. Thoracic spinal cord with normal signal. Large L3 vertebral body lesion with burst fracture with 50% height loss, small lesion in L4, left prevertebral mass at the L1 level measuring 3.9 cm, large periaortic mass at the level of left renal vessels measuring 9.4 cm.       06/29/2017 Initial Diagnosis    Diffuse large B cell lymphoma ([place])      06/29/2017 Pathology Results    Core Bx Rt chest wall: Diffuse large B-cell lymphoma, germinal center subtype; IHC -- positive for CD10, CD20, BCL-6, BCL-2, Ki-67 86%      07/02/2017 - 07/13/2017 Radiation Therapy    Site/dose:   Lumbar spine // 25 Gy in 10 Fx      07/19/2017 Echocardiogram    LVEF 50-55%      07/30/2017 Imaging    CT C/A/P: Interval improvement in some, but worsening of other skeletal lesions. No enlarged mediastinal lymph nodes. Normal appearing esophagus and thyroid gland.  No lung nodules or pleural fluid. Left anterior chest wall mass arising from the anterior aspect of the left third rib, 4.9x3.4cm. Right anterior chest wall mass arising from the anterior aspect of the sixth rib, 5.9x3.0cm, previously 5.6 x 3.9 cm on 06/28/2017. Lytic and expansile lesion in the lateral aspect of the left third rib. Similar changes involving  the posterior aspect of the left seventh rib.Expansile lesion involving the posteromedial aspect of the left scapula. Lytic lesion in the right T10 vertebral body and posterior elements demonstrates some interval  calcification without significant change in extent and with interval superior endplate compression. Lytic, expansile lesion involving the right vertebral body and posterior elements at the T12 level is smaller without well-visualized tumor in the canal on the right, improved. There is interval mild collapse of the posterior aspect of the vertebral body on the right. Normal appearing liver. Normal spleen in size without focal abnormality. The previously demonstrated 6.7 x 5.1 cm left para-aortic retroperitoneal lymph node mass is much smaller, currently measuring 3.1 x 1.7 cm on image number 81 series 2. A more inferior left para-aortic node previously measuring 3.6 x 3.3 cm currently measures 1.8 x 1.2 cm on image number 89 series 2. No new or enlarging lymph nodes are seen. The previously demonstrated left L3 vertebral destructive mass is significantly smaller with decreased size of the previously demonstrated component within the spinal canal. There is also mildly progressive collapse and sclerosis of the L3 vertebral body. There has been an increase in size of a similar-appearing mass in anterior aspect of the right iliac bone, currently measuring 4.0 x 3.4 cm. There is also been increase in size of a similar-appearing mass in the right pubic body, currently measuring approximately 3.1 x 2.8 cm, extending into the symphysis pubis.      08/09/2017 -  Chemotherapy    R-CHOP21: DOXOrubicin (ADRIAMYCIN) 50 mg/m2, d1 + vinCRIStine (ONCOVIN) 2 mg, d1 + riTUXimab (RITUXAN) 375 mg/m2, d1 + cyclophosphamide (CYTOXAN) 750 mg/m2, d1 Q21d x6 cycles planned --Cycle #1, 08/09/17:       Medical History: Past Medical History:  Diagnosis Date  . Cancer ([place])   . Tendonitis     Surgical History: Past Surgical History:  Procedure Laterality Date  . CYSTOSCOPY W/ URETERAL STENT PLACEMENT Right 08/21/2017   Procedure: CYSTOSCOPY WITH RETROGRADE PYELOGRAM, RIGHT;  Surgeon: Alexis Frock, MD;  Location: [facility];   Service: Urology;  Laterality: Right;  . HERNIA REPAIR  1988   Dr. Vida Rigger  . IR FLUORO GUIDE PORT INSERTION RIGHT  07/26/2017  . IR NEPHROSTOMY PLACEMENT RIGHT  08/21/2017  . IR US GUIDE VASC ACCESS RIGHT  07/26/2017    Family History: Family History  Problem Relation Age of Onset  . Cancer Father     Social History: Social History   Social History  . Marital status: Single    Spouse name: N/A  . Number of children: 0  . Years of education: N/A   Occupational History  . Not on file.   Social History Main Topics  . Smoking status: Never Smoker  . Smokeless tobacco: Never Used  . Alcohol use Yes     Comment: occasionally/socially  . Drug use: No  . Sexual activity: No   Other Topics Concern  . 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 Never used smokeless tobacco.   No e-cigarettes   Occasional alcohol consumption socially.   Single, no children.    Allergies: Allergies  Allergen Reactions  . Bee Venom Swelling    Medications:  Current Outpatient Prescriptions  Medication Sig Dispense Refill  . chlorhexidine (PERIDEX) 0.12 % solution 15 mLs by Mouth Rinse route 2 (two) times daily. 120 mL 0  . dronabinol (MARINOL) 5 MG capsule Take 1 capsule (5  mg total) by mouth daily before lunch. 20 capsule 0  . lidocaine-prilocaine (EMLA) cream Apply to affected area once 30 g 3  . LORazepam (ATIVAN) 0.5 MG tablet Take 1 tablet (0.5 mg total) by mouth every 6 (six) hours as needed (Nausea or vomiting). 60 tablet 0  . Nutritional Supplements (NUTRITIONAL SUPPLEMENT PO) House 2.0 two times daily for supplement    . OLANZapine zydis (ZYPREXA) 5 MG disintegrating tablet Take 0.5 tablets (2.5 mg total) by mouth at bedtime. 30 tablet 0  . ondansetron (ZOFRAN) 8 MG tablet Take 1 tablet (8 mg total) by mouth every 8 (eight) hours as needed for nausea or vomiting. 20 tablet 3  . oxyCODONE (OXYCONTIN) 10 mg 12 hr  tablet Take 1 tablet (10 mg total) by mouth every 12 (twelve) hours. 60 tablet 0  . oxyCODONE 10 MG TABS Take 1 tablet (10 mg total) by mouth every 4 (four) hours as needed for moderate pain or severe pain. 50 tablet 0  . pantoprazole (PROTONIX) 40 MG tablet Take 1 tablet (40 mg total) by mouth daily at 12 noon. 30 tablet 0  . polyethylene glycol (MIRALAX / GLYCOLAX) packet Take 17 g by mouth daily as needed for mild constipation. 14 each 0  . potassium phosphate, monobasic, (K-PHOS) 500 MG tablet Take 1 tablet (500 mg total) by mouth 2 (two) times daily with a meal. 14 tablet 0  . prochlorperazine (COMPAZINE) 10 MG tablet Take 1 tablet (10 mg total) by mouth every 6 (six) hours as needed for nausea or vomiting. 30 tablet 0   No current facility-administered medications for this visit.    Facility-Administered Medications Ordered in Other Visits  Medication Dose Route Frequency Provider Last Rate Last Dose  . sodium chloride flush (NS) 0.9 % injection 10 mL  10 mL Intracatheter PRN Ardath Sax, MD   10 mL at 08/09/17 1702    Review of Systems: Review of Systems  Constitutional: Positive for appetite change and fatigue. Negative for chills, diaphoresis, fever and unexpected weight change.  HENT:  Negative.   Eyes: Negative.   Respiratory: Negative.   Cardiovascular: Negative.   Gastrointestinal: Negative.   Endocrine: Negative.   Genitourinary: Negative.    Musculoskeletal: Positive for back pain. Negative for arthralgias, flank pain, myalgias, neck pain and neck stiffness.  Skin: Negative.   Neurological: Negative.   Hematological: Negative.   Psychiatric/Behavioral: Negative.      PHYSICAL EXAMINATION Blood pressure (!) 99/55, pulse (!) 113, temperature (!) 97.3 F (36.3 C), temperature source Oral, resp. rate 16, height 6' 2"  (1.88 m), SpO2 98 %.  ECOG PERFORMANCE STATUS: 4 - Bedbound  Physical Exam  Constitutional: He is oriented to person, place, and time. No  distress.  HENT:  Head: Normocephalic and atraumatic.  Mouth/Throat: Oropharynx is clear and moist. No oropharyngeal exudate.  Eyes: Pupils are equal, round, and reactive to light. Right eye exhibits no discharge. No scleral icterus.  Neck: Normal range of motion. No thyromegaly present.  Cardiovascular: Normal rate and regular rhythm.  Exam reveals no gallop.   No murmur heard. Pulmonary/Chest: Effort normal. No respiratory distress. He has no wheezes. He exhibits no tenderness.  Abdominal: Soft. He exhibits no distension and no mass. There is no tenderness. There is no rebound and no guarding.  Musculoskeletal: He exhibits no edema, tenderness or deformity.  Lymphadenopathy:    He has no cervical adenopathy.  Neurological: He is alert and oriented to person, place, and time. He has normal reflexes.  Skin: Skin is warm and dry. No rash noted. He is not diaphoretic. No erythema. No pallor.     LABORATORY DATA: I have personally reviewed the data as listed: Appointment on 09/06/2017  Component Date Value Ref Range Status  . WBC 09/06/2017 7.4  4.0 - 10.3 10e3/uL Final  . NEUT# 09/06/2017 5.7  1.5 - 6.5 10e3/uL Final  . HGB 09/06/2017 10.4* 13.0 - 17.1 g/dL Final  . HCT 09/06/2017 30.8* 38.4 - 49.9 % Final  . Platelets 09/06/2017 543* 140 - 400 10e3/uL Final  . MCV 09/06/2017 72.8* 79.3 - 98.0 fL Final  . MCH 09/06/2017 24.6* 27.2 - 33.4 pg Final  . MCHC 09/06/2017 33.8  32.0 - 36.0 g/dL Final  . RBC 09/06/2017 4.23  4.20 - 5.82 10e6/uL Final  . RDW 09/06/2017 19.6* 11.0 - 14.6 % Final  . lymph# 09/06/2017 0.2* 0.9 - 3.3 10e3/uL Final  . MONO# 09/06/2017 1.3* 0.1 - 0.9 10e3/uL Final  . Eosinophils Absolute 09/06/2017 0.0  0.0 - 0.5 10e3/uL Final  . Basophils Absolute 09/06/2017 0.1  0.0 - 0.1 10e3/uL Final  . NEUT% 09/06/2017 77.8* 39.0 - 75.0 % Final  . LYMPH% 09/06/2017 2.9* 14.0 - 49.0 % Final  . MONO% 09/06/2017 17.2* 0.0 - 14.0 % Final  . EOS% 09/06/2017 0.1  0.0 - 7.0 %  Final  . BASO% 09/06/2017 2.0  0.0 - 2.0 % Final  . Sodium 09/06/2017 136  136 - 145 mEq/L Final  . Potassium 09/06/2017 2.8* 3.5 - 5.1 mEq/L Final  . Chloride 09/06/2017 100  98 - 109 mEq/L Final  . CO2 09/06/2017 21* 22 - 29 mEq/L Final  . Glucose 09/06/2017 99  70 - 140 mg/dl Final   Glucose reference range is for nonfasting patients. Fasting glucose reference range is 70- 100.  Marland Kitchen BUN 09/06/2017 11.1  7.0 - 26.0 mg/dL Final  . Creatinine 09/06/2017 0.5* 0.7 - 1.3 mg/dL Final  . Total Bilirubin 09/06/2017 1.14  0.20 - 1.20 mg/dL Final  . Alkaline Phosphatase 09/06/2017 141  40 - 150 U/L Final  . AST 09/06/2017 15  5 - 34 U/L Final  . ALT 09/06/2017 10  0 - 55 U/L Final  . Total Protein 09/06/2017 6.4  6.4 - 8.3 g/dL Final  . Albumin 09/06/2017 2.5* 3.5 - 5.0 g/dL Final  . Calcium 09/06/2017 7.1* 8.4 - 10.4 mg/dL Final  . Anion Gap 09/06/2017 15* 3 - 11 mEq/L Final  . EGFR 09/06/2017 >90  >90 ml/min/1.73 m2 Final   eGFR is calculated using the CKD-EPI Creatinine Equation (2009)  . LDH 09/06/2017 293* 125 - 245 U/L Final  . Uric Acid, Serum 09/06/2017 4.8  2.6 - 7.4 mg/dl Final  . Magnesium 09/06/2017 1.8  1.5 - 2.5 mg/dl Final  . Phosphorus, Ser 09/06/2017 1.4* 2.5 - 4.5 mg/dL Final   **Verified by repeat analysis**  . Hep B Core Ab, IgM 09/06/2017 Negative  Negative Final  . HBsAg Screen 09/06/2017 Negative  Negative Final  . Hep B E Ag 09/06/2017 Negative  Negative Final  Admission on 08/21/2017, Discharged on 09/03/2017  No results displayed because visit has over 200 results.         Ardath Sax, MD

## 2017-09-10 NOTE — Telephone Encounter (Signed)
Error

## 2017-09-10 NOTE — Assessment & Plan Note (Signed)
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 Initial presentation with severe left hip and knee due to extensive disease involving the skeletal structures of the spine with multilevel compression fractures. Some spinal canal intrusion without obvious cord compression was noted. Additionally, bulky retroperitoneal lymphadenopathy and a soft tissue mass in the thoracic wall were detected. Based on presence of extensive skeletal disease and soft tissue mass in the chest wall, patient is Stage IVB.  Patient was initially treated with steroids and palliative radiation to the spine which resulted in significant symptomatic improvement. Patient has recovered from his radiation therapy. Pretreatment workup included echocardiogram obtained on 07/19/17 demonstrating left ventricular ejection fraction of 50-55%, patient underwent lab work testing which returned back positive for core antibodies for hepatitis B virus. Additional testing was negative for IgM antibodies and negative for hepatitis B DNA. These findings are consistent with either previous infection for which the patient has completely recovered, or previous history of immunization patient does not recall having either.   Following initiation of systemic chemotherapy, patient was admitted to the hospital with septic shock due to urinary tract infection in the setting off obstruction of the renal drainage by kidney stone. Patient responded to stress dose steroids, pressor support, broad-spectrum antibiotics, and volume resuscitation. Has been able to maintain his own pressures after several days and demonstrated resumed adrenal function. We have retested here for hepatitis B and did not detect any signs of infection reactivation.  Nevertheless, patient demonstrated rapid and profound loss of appetite resulting in severe calorie/protein malnutrition, dramatic decrease  in his activity tolerance going from ECOG 2 down to ECOG 4. Currently, he remains ECOG 4, pain is a significant component of the physical limitation.   Plan: --Continue Allopurinol 300mg  PO QDay --Continue Acyclovir  --On RTC: Labs, clinic visit, possible Cycle #2/6 of R-CHOP with intrathecal methotrexate for CNS relapse prophylaxis -- we will be forced to decrease the [name] dosing and will deliver the next cycle as a Mini-CHOP combination that has been shown to be reasonably effective with significant reduction in toxicity in elderly patients. --Denosumab therapy Q6wks due to extensive skeletal involvement -- we'll receive the next dose on Thursday

## 2017-09-14 ENCOUNTER — Other Ambulatory Visit (HOSPITAL_COMMUNITY): Payer: Self-pay

## 2017-09-17 ENCOUNTER — Other Ambulatory Visit: Payer: Self-pay

## 2017-09-17 ENCOUNTER — Ambulatory Visit (HOSPITAL_BASED_OUTPATIENT_CLINIC_OR_DEPARTMENT_OTHER): Payer: Medicaid Other

## 2017-09-17 ENCOUNTER — Encounter (HOSPITAL_COMMUNITY): Payer: Self-pay | Admitting: *Deleted

## 2017-09-17 ENCOUNTER — Other Ambulatory Visit (HOSPITAL_BASED_OUTPATIENT_CLINIC_OR_DEPARTMENT_OTHER): Payer: Medicaid Other

## 2017-09-17 ENCOUNTER — Ambulatory Visit (HOSPITAL_BASED_OUTPATIENT_CLINIC_OR_DEPARTMENT_OTHER): Payer: Medicaid Other | Admitting: Hematology and Oncology

## 2017-09-17 ENCOUNTER — Inpatient Hospital Stay (HOSPITAL_COMMUNITY)
Admission: AD | Admit: 2017-09-17 | Discharge: 2017-09-25 | DRG: 846 | Disposition: A | Discharge status: Skilled Nursing Facility | Payer: Medicaid Other | Source: Ambulatory Visit | Attending: Hematology and Oncology | Admitting: Hematology and Oncology

## 2017-09-17 ENCOUNTER — Encounter: Payer: Self-pay | Admitting: Hematology and Oncology

## 2017-09-17 VITALS — BP 84/46 | HR 156 | Temp 98.1°F | Resp 20 | Ht 74.0 in | Wt 136.5 lb

## 2017-09-17 DIAGNOSIS — R51 Headache: Secondary | ICD-10-CM | POA: Diagnosis not present

## 2017-09-17 DIAGNOSIS — E41 Nutritional marasmus: Secondary | ICD-10-CM | POA: Diagnosis present

## 2017-09-17 DIAGNOSIS — D638 Anemia in other chronic diseases classified elsewhere: Secondary | ICD-10-CM | POA: Diagnosis present

## 2017-09-17 DIAGNOSIS — C7951 Secondary malignant neoplasm of bone: Secondary | ICD-10-CM | POA: Diagnosis present

## 2017-09-17 DIAGNOSIS — T451X5A Adverse effect of antineoplastic and immunosuppressive drugs, initial encounter: Secondary | ICD-10-CM

## 2017-09-17 DIAGNOSIS — R110 Nausea: Secondary | ICD-10-CM | POA: Diagnosis not present

## 2017-09-17 DIAGNOSIS — M545 Low back pain: Secondary | ICD-10-CM | POA: Diagnosis not present

## 2017-09-17 DIAGNOSIS — D72828 Other elevated white blood cell count: Secondary | ICD-10-CM | POA: Diagnosis present

## 2017-09-17 DIAGNOSIS — M7981 Nontraumatic hematoma of soft tissue: Secondary | ICD-10-CM

## 2017-09-17 DIAGNOSIS — R627 Adult failure to thrive: Secondary | ICD-10-CM | POA: Diagnosis present

## 2017-09-17 DIAGNOSIS — K5903 Drug induced constipation: Secondary | ICD-10-CM

## 2017-09-17 DIAGNOSIS — E878 Other disorders of electrolyte and fluid balance, not elsewhere classified: Secondary | ICD-10-CM | POA: Diagnosis not present

## 2017-09-17 DIAGNOSIS — C8338 Diffuse large B-cell lymphoma, lymph nodes of multiple sites: Secondary | ICD-10-CM

## 2017-09-17 DIAGNOSIS — R634 Abnormal weight loss: Secondary | ICD-10-CM | POA: Diagnosis not present

## 2017-09-17 DIAGNOSIS — E876 Hypokalemia: Secondary | ICD-10-CM

## 2017-09-17 DIAGNOSIS — Z91030 Bee allergy status: Secondary | ICD-10-CM

## 2017-09-17 DIAGNOSIS — Z5111 Encounter for antineoplastic chemotherapy: Secondary | ICD-10-CM | POA: Diagnosis present

## 2017-09-17 DIAGNOSIS — K5900 Constipation, unspecified: Secondary | ICD-10-CM | POA: Diagnosis present

## 2017-09-17 DIAGNOSIS — R591 Generalized enlarged lymph nodes: Secondary | ICD-10-CM | POA: Diagnosis present

## 2017-09-17 DIAGNOSIS — M549 Dorsalgia, unspecified: Secondary | ICD-10-CM | POA: Diagnosis not present

## 2017-09-17 DIAGNOSIS — R7989 Other specified abnormal findings of blood chemistry: Secondary | ICD-10-CM

## 2017-09-17 DIAGNOSIS — R945 Abnormal results of liver function studies: Secondary | ICD-10-CM | POA: Diagnosis not present

## 2017-09-17 DIAGNOSIS — T50995A Adverse effect of other drugs, medicaments and biological substances, initial encounter: Secondary | ICD-10-CM | POA: Diagnosis present

## 2017-09-17 DIAGNOSIS — E43 Unspecified severe protein-calorie malnutrition: Secondary | ICD-10-CM | POA: Diagnosis present

## 2017-09-17 DIAGNOSIS — E861 Hypovolemia: Secondary | ICD-10-CM | POA: Diagnosis present

## 2017-09-17 DIAGNOSIS — C8330 Diffuse large B-cell lymphoma, unspecified site: Secondary | ICD-10-CM | POA: Diagnosis present

## 2017-09-17 DIAGNOSIS — R17 Unspecified jaundice: Secondary | ICD-10-CM | POA: Diagnosis present

## 2017-09-17 DIAGNOSIS — Z960 Presence of urogenital implants: Secondary | ICD-10-CM | POA: Diagnosis present

## 2017-09-17 DIAGNOSIS — D630 Anemia in neoplastic disease: Secondary | ICD-10-CM | POA: Diagnosis not present

## 2017-09-17 DIAGNOSIS — D72829 Elevated white blood cell count, unspecified: Secondary | ICD-10-CM | POA: Diagnosis not present

## 2017-09-17 DIAGNOSIS — R000 Tachycardia, unspecified: Secondary | ICD-10-CM | POA: Diagnosis present

## 2017-09-17 DIAGNOSIS — Z681 Body mass index (BMI) 19 or less, adult: Secondary | ICD-10-CM | POA: Diagnosis not present

## 2017-09-17 DIAGNOSIS — I959 Hypotension, unspecified: Secondary | ICD-10-CM | POA: Diagnosis present

## 2017-09-17 DIAGNOSIS — Z95828 Presence of other vascular implants and grafts: Secondary | ICD-10-CM

## 2017-09-17 DIAGNOSIS — R5383 Other fatigue: Secondary | ICD-10-CM | POA: Diagnosis not present

## 2017-09-17 LAB — CBC WITH DIFFERENTIAL/PLATELET
BASO%: 0.1 % (ref 0.0–2.0)
Basophils Absolute: 0 10*3/uL (ref 0.0–0.1)
EOS ABS: 0.1 10*3/uL (ref 0.0–0.5)
EOS%: 0.8 % (ref 0.0–7.0)
HCT: 35.4 % — ABNORMAL LOW (ref 38.4–49.9)
HEMOGLOBIN: 11.6 g/dL — AB (ref 13.0–17.1)
LYMPH#: 1.3 10*3/uL (ref 0.9–3.3)
LYMPH%: 12.8 % — ABNORMAL LOW (ref 14.0–49.0)
MCH: 24.6 pg — ABNORMAL LOW (ref 27.2–33.4)
MCHC: 32.8 g/dL (ref 32.0–36.0)
MCV: 75 fL — AB (ref 79.3–98.0)
MONO#: 1.1 10*3/uL — ABNORMAL HIGH (ref 0.1–0.9)
MONO%: 10.5 % (ref 0.0–14.0)
NEUT%: 75.8 % — ABNORMAL HIGH (ref 39.0–75.0)
NEUTROS ABS: 7.9 10*3/uL — AB (ref 1.5–6.5)
Platelets: 453 10*3/uL — ABNORMAL HIGH (ref 140–400)
RBC: 4.72 10*6/uL (ref 4.20–5.82)
RDW: 19.8 % — AB (ref 11.0–14.6)
WBC: 10.4 10*3/uL — AB (ref 4.0–10.3)

## 2017-09-17 LAB — COMPREHENSIVE METABOLIC PANEL
ALBUMIN: 2.4 g/dL — AB (ref 3.5–5.0)
ALT: <6 U/L (ref 0–55)
AST: 15 U/L (ref 5–34)
Alkaline Phosphatase: 146 U/L (ref 40–150)
Anion Gap: 18 mEq/L — ABNORMAL HIGH (ref 3–11)
BILIRUBIN TOTAL: 1.26 mg/dL — AB (ref 0.20–1.20)
BUN: 7.3 mg/dL (ref 7.0–26.0)
CO2: 19 mEq/L — ABNORMAL LOW (ref 22–29)
Calcium: 6.4 mg/dL — CL (ref 8.4–10.4)
Chloride: 98 mEq/L (ref 98–109)
Creatinine: 0.5 mg/dL — ABNORMAL LOW (ref 0.7–1.3)
EGFR: >60 mL/min/{1.73_m2} (ref >60)
Glucose: 108 mg/dl (ref 70–140)
Potassium: 3.5 mEq/L (ref 3.5–5.1)
SODIUM: 135 meq/L — AB (ref 136–145)
TOTAL PROTEIN: 6.3 g/dL — AB (ref 6.4–8.3)

## 2017-09-17 LAB — URIC ACID: Uric Acid, Serum: 6.8 mg/dl (ref 2.6–7.4)

## 2017-09-17 LAB — MAGNESIUM: Magnesium: 1.6 mg/dl (ref 1.5–2.5)

## 2017-09-17 MED ORDER — DRONABINOL 5 MG PO CAPS: 5.0000 mg | ORAL_CAPSULE | Freq: Every day | ORAL | 0 refills | Status: DC

## 2017-09-17 MED ORDER — CALCIUM GLUCONATE 10 % IV SOLN
2.0000 g | Freq: Once | INTRAVENOUS | Status: AC
  Administered 2017-09-17: 2 g via INTRAVENOUS
  Filled 2017-09-17: qty 20

## 2017-09-17 MED ORDER — MORPHINE SULFATE 4 MG/ML IJ SOLN
2.0000 mg | Freq: Once | INTRAMUSCULAR | Status: AC
  Administered 2017-09-17: 2 mg via INTRAVENOUS
  Filled 2017-09-17: qty 1

## 2017-09-17 MED ORDER — SODIUM CHLORIDE 0.9% FLUSH: 10.0000 mL | INTRAVENOUS | Status: DC | PRN

## 2017-09-17 MED ORDER — OLANZAPINE 5 MG PO TBDP
2.5000 mg | ORAL_TABLET | Freq: Every day | ORAL | Status: DC
  Administered 2017-09-17 – 2017-09-18 (×2): 2.5 mg via ORAL
  Filled 2017-09-17 (×3): qty 0.5

## 2017-09-17 MED ORDER — ENSURE ENLIVE PO LIQD
237.0000 mL | Freq: Two times a day (BID) | ORAL | Status: DC
  Administered 2017-09-19 – 2017-09-23 (×5): 237 mL via ORAL

## 2017-09-17 MED ORDER — POLYETHYLENE GLYCOL 3350 17 G PO PACK
17.0000 g | PACK | Freq: Every day | ORAL | Status: DC | PRN
  Administered 2017-09-22: 17 g via ORAL
  Filled 2017-09-17: qty 1

## 2017-09-17 MED ORDER — DEXTROSE-NACL 5-0.9 % IV SOLN
INTRAVENOUS | Status: DC
  Administered 2017-09-17 – 2017-09-24 (×9): via INTRAVENOUS

## 2017-09-17 MED ORDER — PANTOPRAZOLE SODIUM 40 MG PO TBEC
40.0000 mg | DELAYED_RELEASE_TABLET | Freq: Every day | ORAL | Status: DC
  Administered 2017-09-18 – 2017-09-25 (×8): 40 mg via ORAL
  Filled 2017-09-17 (×8): qty 1

## 2017-09-17 MED ORDER — LORAZEPAM 0.5 MG PO TABS: 0.5000 mg | ORAL_TABLET | Freq: Four times a day (QID) | ORAL | Status: DC | PRN

## 2017-09-17 MED ORDER — MORPHINE SULFATE (PF) 4 MG/ML IV SOLN
INTRAVENOUS | Status: AC
  Filled 2017-09-17: qty 1

## 2017-09-17 MED ORDER — OXYCODONE HCL ER 10 MG PO T12A
10.0000 mg | EXTENDED_RELEASE_TABLET | Freq: Two times a day (BID) | ORAL | Status: DC
  Administered 2017-09-17 – 2017-09-25 (×16): 10 mg via ORAL
  Filled 2017-09-17 (×17): qty 1

## 2017-09-17 MED ORDER — PROCHLORPERAZINE MALEATE 10 MG PO TABS: 10.0000 mg | ORAL_TABLET | Freq: Four times a day (QID) | ORAL | Status: DC | PRN

## 2017-09-17 MED ORDER — OXYCODONE HCL 5 MG PO TABS
10.0000 mg | ORAL_TABLET | ORAL | Status: DC | PRN
  Administered 2017-09-18 – 2017-09-25 (×16): 10 mg via ORAL
  Filled 2017-09-17 (×16): qty 2

## 2017-09-17 MED ORDER — ONDANSETRON HCL 8 MG PO TABS: 8.0000 mg | ORAL_TABLET | Freq: Three times a day (TID) | ORAL | Status: DC | PRN

## 2017-09-17 MED ORDER — CHLORHEXIDINE GLUCONATE 0.12 % MT SOLN
15.0000 mL | Freq: Two times a day (BID) | OROMUCOSAL | Status: DC
  Administered 2017-09-17 – 2017-09-24 (×13): 15 mL via OROMUCOSAL
  Filled 2017-09-17 (×13): qty 15

## 2017-09-17 MED ORDER — DRONABINOL 5 MG PO CAPS
5.0000 mg | ORAL_CAPSULE | Freq: Every day | ORAL | Status: DC
  Administered 2017-09-18 – 2017-09-25 (×8): 5 mg via ORAL
  Filled 2017-09-17 (×8): qty 1

## 2017-09-17 MED ORDER — ENOXAPARIN SODIUM 40 MG/0.4ML ~~LOC~~ SOLN
40.0000 mg | SUBCUTANEOUS | Status: DC
  Administered 2017-09-19 – 2017-09-25 (×7): 40 mg via SUBCUTANEOUS
  Filled 2017-09-17 (×8): qty 0.4

## 2017-09-17 MED ORDER — SODIUM CHLORIDE 0.9 % IV SOLN
Freq: Once | INTRAVENOUS | Status: AC
  Administered 2017-09-17: 13:00:00 via INTRAVENOUS

## 2017-09-17 NOTE — Progress Notes (Signed)
Spine reddened, no open wounds. Foam dressing applied along the entire spine and Rt. Inner knee which has a redden abrasion. Pt repositioned off back and heels elevated off bed.

## 2017-09-17 NOTE — H&P (Signed)
Hematology/Oncology Admission H&P   Reason for Referral: Hypovolemia, inpatient administration of systemic chemotherapy  HPI:  Cory Ibarra is a 56 y.o. 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 Patient is undergoing systemic chemotherapy with R-CHOP combination, having received the initial cycle five weeks ago. 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 Patient suffered protracted recovery due to severe malnutrition, failure to thrive, and severe deconditioning. Finally, he has been able to return to the clinic to consider the second cycle of therapy.Due to severe deterioration of performance status from a ECOG 1 down to ECOG 3, Decision was made to attenuate his next cycle of chemotherapy as outlined below. In addition, presentation in the clinic patient was found to be hypotensive and tachycardic, and IV fluids were administered. Patient's blood pressure and heart rate responded positively to the first letter fluids, but due to prolonged administration of fluids, and chemotherapy was unable to proceed as outpatient. As the next option for scheduling his treatment would be next week, the delay was deemed unacceptable based on the curative intent on the therapy and significant delivery suffered by the patient. With that in mind, and due to the need for additional IV hydration, patient was admitted to the hospital.  Patient continues to complain of generalized weakness.Continues to lose weight although he reports that he is getting stronger at his current skilled care facility. There is some divergence in the reported activity level between myself and the nursing staff. We, patient reported being able to stand up and support his own weight, but to my nurse assistants and nurses he reported  he is unable to stand on his own. Patient reported feeling significantly better after the 1st L of fluids. He denies any integral fevers, chills or night sweats. Sizing your respiratory, cardiovascular, gastrointestinal, or genitourinary symptoms. No focal weakness. He continues to have significant amount of musculoskeletal pain, although he does report that the pain has improved compared to the last visit in the clinic.    Past Medical History:  Diagnosis Date  . Cancer ([place])   . Tendonitis   :  Past Surgical History:  Procedure Laterality Date  . CYSTOSCOPY W/ URETERAL STENT PLACEMENT Right 08/21/2017   Procedure: CYSTOSCOPY WITH RETROGRADE PYELOGRAM, RIGHT;  Surgeon: Alexis Frock, MD;  Location: [facility];  Service: Urology;  Laterality: Right;  . HERNIA REPAIR  1988   Dr. Vida Rigger  . IR FLUORO GUIDE PORT INSERTION RIGHT  07/26/2017  . IR NEPHROSTOMY PLACEMENT RIGHT  08/21/2017  . IR US GUIDE VASC ACCESS RIGHT  07/26/2017  :   Current Facility-Administered Medications:  .  chlorhexidine (PERIDEX) 0.12 % solution 15 mL, 15 mL, Mouth Rinse, BID, Serinity Ware G, MD .  dextrose 5 %-0.9 % sodium chloride infusion, , Intravenous, Continuous, Dondi Burandt, Marinell Blight, MD .  Derrill Memo ON 09/18/2017] dronabinol (MARINOL) capsule 5 mg, 5 mg, Oral, QAC lunch, Aaro Meyers Darnell Level, MD .  Derrill Memo ON 09/19/2017] enoxaparin (LOVENOX) injection 40 mg, 40 mg, Subcutaneous, Q24H, Mackenzie Groom, Marinell Blight, MD .  Derrill Memo ON 09/18/2017] feeding supplement (ENSURE ENLIVE) (ENSURE ENLIVE) liquid 237 mL, 237 mL, Oral, BID BM, Hye Trawick G, MD .  LORazepam (ATIVAN) tablet 0.5 mg, 0.5 mg, Oral, Q6H PRN, Armoni Kludt G, MD .  OLANZapine zydis (ZYPREXA) disintegrating tablet 2.5 mg, 2.5 mg, Oral, QHS, Tyne Banta Darnell Level,  MD .  ondansetron (ZOFRAN) tablet 8 mg, 8 mg, Oral, Q8H PRN, Julina Altmann G, MD .  oxyCODONE (Oxy IR/ROXICODONE) immediate release tablet 10 mg, 10 mg, Oral, Q4H PRN, Jakota Manthei Darnell Level, MD .  oxyCODONE  (OXYCONTIN) 12 hr tablet 10 mg, 10 mg, Oral, Q12H, Nasrin Lanzo, Marinell Blight, MD .  Derrill Memo ON 09/18/2017] pantoprazole (PROTONIX) EC tablet 40 mg, 40 mg, Oral, Q1200, Shaunte Tuft G, MD .  polyethylene glycol (MIRALAX / GLYCOLAX) packet 17 g, 17 g, Oral, Daily PRN, Sarah Zerby Darnell Level, MD .  prochlorperazine (COMPAZINE) tablet 10 mg, 10 mg, Oral, Q6H PRN, Andilyn Bettcher G, MD .  sodium chloride flush (NS) 0.9 % injection 10-40 mL, 10-40 mL, Intracatheter, PRN, Rease Swinson, Marinell Blight, MD  Facility-Administered Medications Ordered in Other Encounters:  .  sodium chloride flush (NS) 0.9 % injection 10 mL, 10 mL, Intracatheter, PRN, Ardath Sax, MD, 10 mL at 08/09/17 1702:  . chlorhexidine  15 mL Mouth Rinse BID  . [START ON 09/18/2017] dronabinol  5 mg Oral QAC lunch  . [START ON 09/19/2017] enoxaparin (LOVENOX) injection  40 mg Subcutaneous Q24H  . [START ON 09/18/2017] feeding supplement (ENSURE ENLIVE)  237 mL Oral BID BM  . OLANZapine zydis  2.5 mg Oral QHS  . oxyCODONE  10 mg Oral Q12H  . [START ON 09/18/2017] pantoprazole  40 mg Oral Q1200  :  Allergies  Allergen Reactions  . Bee Venom Swelling  :   Family History  Problem Relation Age of Onset  . Cancer Father      Social History   Social History  . Marital status: Single    Spouse name: N/A  . Number of children: 0  . Years of education: N/A   Occupational History  . Not on file.   Social History Main Topics  . Smoking status: Never Smoker  . Smokeless tobacco: Never Used  . Alcohol use Yes     Comment: occasionally/socially  . Drug use: No  . Sexual activity: No   Other Topics Concern  . 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 Never used smokeless tobacco.   No e-cigarettes   Occasional alcohol consumption socially.   Single, no children.    Review of Systems: As per HPI. All of the systems are negative.  Physical Exam:  Blood  pressure 100/71, pulse (!) 111, temperature (!) 97.5 F (36.4 C), resp. rate 18, SpO2 99 %.  Emaciated male, no acute distress, appears to be awake, alert, oriented and fatigue. HEENT: And if there is clear, most mucous membranes. PERRLA, EOMI Lungs: Clear to auscultation bilaterally without crackles or expiratory wheezing Cardiac: S1/S2, regular, rapid without murmurs Abdomen: Soft, nontender, nondistended. Bosons are positive. No palpable masses. GU: Unremarkable.  Vascular: No peripheral edema. Lymph nodes: No Palpable lymphadenopathy in the cervical, supraclavicular, Axillary, or inguinal regions bilaterally Neurologic: Generalized weakness, no focal neurological deficits Skin: Decubitus skin injury, grade 1 noted over the right scapular region; At this time, no additional decubitus areas noted. Musculoskeletal: Severe muscle atrophy.  LABS:   Recent Labs  09/17/17 1052  WBC 10.4*  HGB 11.6*  HCT 35.4*  PLT 453*     Recent Labs  09/17/17 1052  NA 135*  K 3.5  CO2 19*  GLUCOSE 108  BUN 7.3  CREATININE 0.5*  CALCIUM 6.4*      RADIOLOGY:  Dg Chest 2 View  Result Date:  08/21/2017 CLINICAL DATA:  Dizzy in short of breath EXAM: CHEST  2 VIEW COMPARISON:  06/28/2017 FINDINGS: Right jugular Port-A-Cath placed with its tip at the cavoatrial junction. No pneumothorax. Lungs are hyperaerated and clear. Normal heart size. No pleural effusion. IMPRESSION: No active cardiopulmonary disease. Electronically Signed   By: Marybelle Killings M.D.   On: 08/21/2017 13:50   Dg Thoracic Spine 2 View  Result Date: 08/21/2017 CLINICAL DATA:  Pain. History of lower B-cell non-Hodgkin's lymphoma EXAM: THORACIC SPINE 3 VIEWS COMPARISON:  MR thoracic spine June 28, 2017 and chest radiograph August 21, 2017 FINDINGS: Frontal, lateral, and swimmer's views obtained. There is no evident acute fracture or spondylolisthesis. The pedicle at T12 on the right is no longer apparent and is presumably  destroyed by neoplasm. This finding was evident on prior MR. there is subtle loss of detail along the lateral left T8 pedicle, noted by prior MR to be involved with tumor. There is no obvious neoplastic involvement vertebral bodies by radiography. MR shows tumor involving several vertebral bodies. IMPRESSION: No fracture or spondylolisthesis. There are multiple metastatic foci seen in the thoracic spine performed three months prior. By radiography, there is loss of the pedicle of T12 on the right and subtle loss of the lateral aspect of the pedicle of T8 on the left. 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 No other neoplastic foci identified by radiography the bony structures. Note that there are multiple foci of tumor and thoracic vertebral bodies seen on recent MR not seen by radiography currently. Electronically Signed   By: Lowella Grip III M.D.   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 Electronically Signed   By: Lowella Grip III M.D.   On: 08/21/2017 16:02   Result Date: 08/21/2017 CLINICAL DATA:  Pain. History of large B-cell non-Hodgkin's lymphoma EXAM: LUMBAR SPINE - COMPLETE 4+ VIEW COMPARISON:  Lumbar MRI June 28, 2017 FINDINGS: Frontal, lateral, spot lumbosacral lateral, and bilateral oblique views were obtained. There are 5 non-rib-bearing lumbar type vertebral bodies. There is a stable fracture of the L3 vertebral body, more severe along the leftward aspect. There is lucency in this area suggesting metastatic focus as likely etiology for the fracture. There is apparent destruction in the region of the pedicle on the left at L3. No other fracture evident. No spondylolisthesis. Disc spaces appear normal. No other lytic or destructive lesions. There is a probable bone island  medial upper right iliac crest. There is facet osteoarthritic change at L4-5 and L5-S1 bilaterally. IMPRESSION: There is again noted fracture L3 vertebral body. Tumor extends into and involves the left L3 pedicle. This finding was present on prior lumbar MR. No new bone lesions are evident. No new fracture. No spondylolisthesis. There is facet osteoarthritic change at L4-5 L5-S1 bilaterally. Electronically Signed: By: Lowella Grip III M.D. On: 08/21/2017 15:52   Dg C-arm 1-60 Min-no Report  Result Date: 08/21/2017 Fluoroscopy was utilized by the requesting physician.  No radiographic interpretation.   Ir Nephrostomy Placement Right  Result Date: 08/22/2017 INDICATION: History of lymphoma, admitted with neutropenic fever in urosepsis secondary to right-sided ureteral stone. Post failed attempted right-sided ureteral stent placement. As such, request made for placement of a right-sided percutaneous nephrostomy catheter for infection source control purposes. EXAM: 1. ULTRASOUND GUIDANCE  FOR PUNCTURE OF THE RIGHT RENAL COLLECTING SYSTEM 2. RIGHT PERCUTANEOUS NEPHROSTOMY TUBE PLACEMENT. COMPARISON:  CT abdomen pelvis - 07/30/2017; right-sided retrograde pyelogram and attempted stent placement - earlier same day MEDICATIONS: The patient is currently admitted to the hospital and receiving intravenous antibiotics.;The antibiotic was administered in an appropriate time frame prior to skin puncture. ANESTHESIA/SEDATION: Moderate (conscious) sedation was employed during this procedure. A total of Versed 1 mg and Fentanyl 75 mcg was administered intravenously. Moderate Sedation Time: 5 minutes. The patient's level of consciousness and vital signs were monitored continuously by radiology nursing throughout the procedure under my direct supervision. CONTRAST:  20 mL Isovue 300 administered into the collecting system FLUOROSCOPY TIME:  1 minute 6 seconds (35.4 mGy) COMPLICATIONS: None immediate. PROCEDURE: The  procedure, risks, benefits, and alternatives were explained to the patient. Questions regarding the procedure were encouraged and answered. The patient understands and consents to the procedure. A timeout was performed prior to the initiation of the procedure. The right flank region was prepped with Betadine in a sterile fashion, and a sterile drape was applied covering the operative field. A sterile gown and sterile gloves were used for the procedure. Local anesthesia was provided with 1% Lidocaine with epinephrine. Ultrasound was used to localize the right kidney. Under direct ultrasound guidance, a 21 gauge needle was advanced into the renal collecting system. An ultrasound image documentation was performed. Access within the collecting system was confirmed with the efflux of urine followed by contrast injection. Over a Nitrex wire, the inner three [place] catheter of an Accustick set was advanced into the renal collecting system. Contrast injection was injected into the collecting system as several spot radiographs were obtained in various obliquities confirming puncture within a posterior inferior calix. As such, the tract was dilated with an Accustick stent. Over a guide wire, a 10-French percutaneous nephrostomy catheter was advanced into the collecting system where the coil was formed and locked. Contrast was injected and several sport radiographs were obtained in various obliquities confirming access. The catheter was secured at the skin with a Prolene retention suture and a gravity bag was placed. A dressing was placed. The patient tolerated procedure well without immediate postprocedural complication. FINDINGS: Ultrasound scanning demonstrates a moderately dilated right renal collecting system. Under direct ultrasound guidance, a posterior inferior calix was targeted allowing advancement of an 10-French percutaneous nephrostomy catheter under intermittent fluoroscopic guidance. Contrast injection confirmed  appropriate positioning. Note is again made of an occlusive stone within the distal aspect of the right ureter. IMPRESSION: Successful ultrasound and fluoroscopic guided placement of a right sided 10 French PCN. This stone may be use for subsequent percutaneous nephrolithotomy at a later date as clinically indicated. Electronically Signed   By: Sandi Mariscal M.D.   On: 08/22/2017 07:59    Assessment and Plan:  Aggressive Diffuse large B-cell lymphoma With extensive skeletal involvement in addition to diffuse lymphadenopathy.Patient tolerated the usual cycle of R-chop poorlyWith treason complicated by septic shock due to obstructive neuropathy followed by rapid and abrupt deconditioning and severe combined protein/calorie malnutrition which was likely exacerbated by patient psychological conditions. At the present time, patient has recovered sufficiently to considering initiation of therapy. He remains insistent on continuing to pursue systemic therapy for his malignancy. At this time, he's not a candidate for full dose R-CHOP, but mini-R-CHOP can be considered Having been shown to be reasonably effective and significantly less toxic when used in elderly patients. Change in the therapy has been discussed with the patient extensively. He  understands that his chance of curative outcome have been diminished, but is willing to proceed with the current therapy. In addition to those reducing his chemotherapy, I will add prophylactic methotrexate injection intrathecally for which the patient scheduled tomorrow. Based on severe infectious complications of the first cycle, this treatment will be administered with Neulasta support.  **DLBCL: reasonably advanced this is with management complicated by patient's performance standards as outlined above. --Proceed with IT MTX tomorrow --Systemic Chemoimmunotherapy after MTX dose --Neulasta 24-72 hrs after systemic chemotherapy  **Hypovolemia/Hypocalcemia:Hypovolemia most  likely related to poor oral intake despite the patient's assurance of drinking lots of fluids. He clearly response to parenteral fluid supplementation and will continue that overnight. Additionally, patient continues to have significant decreased calcium level. This is partially attributable to hyperbilirubinemia, but also to the effect of previously administered denosumab. --Replace calcium today IV --D5 NS @100mL /Hr overnight  **Diet/Activity: -- the requested physical therapy, occupational therapy involvement to continue rehabilitation of the patient without interruption -- will request nutritional consult to assist in appropriately supplementing patient's diet  **PPx: --SQ enoxaparin prophylaxis --Decubitus ppx, will likely consult Wound Nurse tomorrow for additional input --PPI for stress ulcer prophylaxis  **Code Status: Modified code  **Disposition: patient will likely stay hospitalized for at least two midnights to allow for administration of chemotherapy and to minimize his travel to and from his current facility which produces severe anxiety in. The patient   Ardath Sax, MD 09/17/2017, 9:17 PM

## 2017-09-17 NOTE — Progress Notes (Signed)
No treatment today per Dr. [name]. Pt to receive 1L IVF over 2 hours an EKG and recheck BP prior to d/c. Pharmacy notified of change.

## 2017-09-17 NOTE — Patient Instructions (Signed)
Dehydration, Adult Dehydration is when there is not enough fluid or water in your body. This happens when you lose more fluids than you take in. Dehydration can range from mild to very bad. It should be treated right away to keep it from getting very bad. Symptoms of mild dehydration may include:  Thirst.  Dry lips.  Slightly dry mouth.  Dry, warm skin.  Dizziness. Symptoms of moderate dehydration may include:  Very dry mouth.  Muscle cramps.  Dark pee (urine). Pee may be the color of tea.  Your body making less pee.  Your eyes making fewer tears.  Heartbeat that is uneven or faster than normal (palpitations).  Headache.  Light-headedness, especially when you stand up from sitting.  Fainting (syncope). Symptoms of very bad dehydration may include:  Changes in skin, such as: ? Cold and clammy skin. ? Blotchy (mottled) or pale skin. ? Skin that does not quickly return to normal after being lightly pinched and let go (poor skin turgor).  Changes in body fluids, such as: ? Feeling very thirsty. ? Your eyes making fewer tears. ? Not sweating when body temperature is high, such as in hot weather. ? Your body making very little pee.  Changes in vital signs, such as: ? Weak pulse. ? Pulse that is more than 100 beats a minute when you are sitting still. ? Fast breathing. ? Low blood pressure.  Other changes, such as: ? Sunken eyes. ? Cold hands and feet. ? Confusion. ? Lack of energy (lethargy). ? Trouble waking up from sleep. ? Short-term weight loss. ? Unconsciousness. Follow these instructions at home:  If told by your doctor, drink an ORS: ? Make an ORS by using instructions on the package. ? Start by drinking small amounts, about  cup (120 mL) every 5-10 minutes. ? Slowly drink more until you have had the amount that your doctor said to have.  Drink enough clear fluid to keep your pee clear or pale yellow. If you were told to drink an ORS, finish the ORS  first, then start slowly drinking clear fluids. Drink fluids such as: ? Water. Do not drink only water by itself. Doing that can make the salt (sodium) level in your body get too low (hyponatremia). ? Ice chips. ? Fruit juice that you have added water to (diluted). ? Low-calorie sports drinks.  Avoid: ? Alcohol. ? Drinks that have a lot of sugar. These include high-calorie sports drinks, fruit juice that does not have water added, and soda. ? Caffeine. ? Foods that are greasy or have a lot of fat or sugar.  Take over-the-counter and prescription medicines only as told by your doctor.  Do not take salt tablets. Doing that can make the salt level in your body get too high (hypernatremia).  Eat foods that have minerals (electrolytes). Examples include bananas, oranges, potatoes, tomatoes, and spinach.  Keep all follow-up visits as told by your doctor. This is important. Contact a doctor if:  You have belly (abdominal) pain that: ? Gets worse. ? Stays in one area (localizes).  You have a rash.  You have a stiff neck.  You get angry or annoyed more easily than normal (irritability).  You are more sleepy than normal.  You have a harder time waking up than normal.  You feel: ? Weak. ? Dizzy. ? Very thirsty.  You have peed (urinated) only a small amount of very dark pee during 6-8 hours. Get help right away if:  You have symptoms of   very bad dehydration.  You cannot drink fluids without throwing up (vomiting).  Your symptoms get worse with treatment.  You have a fever.  You have a very bad headache.  You are throwing up or having watery poop (diarrhea) and it: ? Gets worse. ? Does not go away.  You have blood or something green (bile) in your throw-up.  You have blood in your poop (stool). This may cause poop to look black and tarry.  You have not peed in 6-8 hours.  You pass out (faint).  Your heart rate when you are sitting still is more than 100 beats a  minute.  You have trouble breathing. This information is not intended to replace advice given to you by your health care provider. Make sure you discuss any questions you have with your health care provider. Document Released: 09/16/2009 Document Revised: 06/09/2016 Document Reviewed: 01/14/2016 Elsevier Interactive Patient Education  2018 [employer]  

## 2017-09-17 NOTE — Telephone Encounter (Signed)
RX faxed to [facility] @ [phone], phone number [phone] 

## 2017-09-17 NOTE — Progress Notes (Signed)
Patient VS improved after 1L NS. Per MD, patient stable and able to receive chemotherapy. No availability for treatment today and no openings in schedule until next Tuesday. Dr. [name] instructed to admit patient to receive chemotherapy. Patient voiced understanding. Patient tranferred to room 1334 via wheelchair by staff. Report called by Delle Reining, RN.   Wylene Simmer, BSN, RN 09/17/2017 4:39 PM

## 2017-09-18 ENCOUNTER — Inpatient Hospital Stay (HOSPITAL_COMMUNITY): Payer: Medicaid Other

## 2017-09-18 ENCOUNTER — Ambulatory Visit: Payer: Self-pay

## 2017-09-18 ENCOUNTER — Ambulatory Visit (HOSPITAL_COMMUNITY): Payer: Medicaid Other

## 2017-09-18 ENCOUNTER — Telehealth: Payer: Self-pay | Admitting: Hematology and Oncology

## 2017-09-18 ENCOUNTER — Ambulatory Visit (HOSPITAL_COMMUNITY): Admission: RE | Admit: 2017-09-18 | Payer: Medicaid Other | Source: Ambulatory Visit

## 2017-09-18 ENCOUNTER — Other Ambulatory Visit (HOSPITAL_COMMUNITY): Payer: Self-pay

## 2017-09-18 ENCOUNTER — Inpatient Hospital Stay (HOSPITAL_COMMUNITY)
Admission: AD | Admit: 2017-09-18 | Discharge: 2017-09-18 | Disposition: A | Discharge status: Home / Self Care | Payer: Medicaid Other | Source: Ambulatory Visit | Attending: Hematology and Oncology | Admitting: Hematology and Oncology

## 2017-09-18 DIAGNOSIS — C8338 Diffuse large B-cell lymphoma, lymph nodes of multiple sites: Secondary | ICD-10-CM

## 2017-09-18 DIAGNOSIS — E876 Hypokalemia: Secondary | ICD-10-CM

## 2017-09-18 DIAGNOSIS — E878 Other disorders of electrolyte and fluid balance, not elsewhere classified: Secondary | ICD-10-CM

## 2017-09-18 LAB — COMPREHENSIVE METABOLIC PANEL
ALBUMIN: 2.3 g/dL — AB (ref 3.5–5.0)
ALT: 9 U/L — AB (ref 17–63)
AST: 17 U/L (ref 15–41)
Alkaline Phosphatase: 123 U/L (ref 38–126)
Anion gap: 13 (ref 5–15)
BUN: 10 mg/dL (ref 6–20)
CHLORIDE: 99 mmol/L — AB (ref 101–111)
CO2: 24 mmol/L (ref 22–32)
CREATININE: 0.38 mg/dL — AB (ref 0.61–1.24)
Calcium: 5.9 mg/dL — CL (ref 8.9–10.3)
GFR calc Af Amer: >60 mL/min (ref >60)
GFR calc non Af Amer: >60 mL/min (ref >60)
GLUCOSE: 103 mg/dL — AB (ref 65–99)
POTASSIUM: 2.6 mmol/L — AB (ref 3.5–5.1)
Sodium: 136 mmol/L (ref 135–145)
Total Bilirubin: 1.1 mg/dL (ref 0.3–1.2)
Total Protein: 5.2 g/dL — ABNORMAL LOW (ref 6.5–8.1)

## 2017-09-18 LAB — CBC WITH DIFFERENTIAL/PLATELET
BASOS ABS: 0 10*3/uL (ref 0.0–0.1)
Basophils Relative: 0 %
Eosinophils Absolute: 0.1 10*3/uL (ref 0.0–0.7)
Eosinophils Relative: 1 %
HEMATOCRIT: 27.7 % — AB (ref 39.0–52.0)
HEMOGLOBIN: 9.2 g/dL — AB (ref 13.0–17.0)
LYMPHS PCT: 15 %
Lymphs Abs: 1.1 10*3/uL (ref 0.7–4.0)
MCH: 24.9 pg — ABNORMAL LOW (ref 26.0–34.0)
MCHC: 33.2 g/dL (ref 30.0–36.0)
MCV: 75.1 fL — AB (ref 78.0–100.0)
MONO ABS: 0.5 10*3/uL (ref 0.1–1.0)
MONOS PCT: 7 %
NEUTROS ABS: 5.7 10*3/uL (ref 1.7–7.7)
NEUTROS PCT: 77 %
Platelets: 336 10*3/uL (ref 150–400)
RBC: 3.69 MIL/uL — ABNORMAL LOW (ref 4.22–5.81)
RDW: 19.7 % — AB (ref 11.5–15.5)
WBC: 7.3 10*3/uL (ref 4.0–10.5)

## 2017-09-18 LAB — HEPATITIS B DNA, ULTRAQUANTITATIVE, PCR: HBV DNA SERPL PCR-ACNC: NOT DETECTED IU/mL

## 2017-09-18 LAB — CSF CELL COUNT WITH DIFFERENTIAL
RBC COUNT CSF: 250 /mm3 — AB
Tube #: 4
WBC CSF: 1 /mm3 (ref 0–5)

## 2017-09-18 LAB — PHOSPHORUS
PHOSPHORUS: 1.9 mg/dL — AB (ref 2.5–4.5)
Phosphorus: 2 mg/dL — ABNORMAL LOW (ref 2.5–4.6)

## 2017-09-18 LAB — PROTEIN, CSF: Total  Protein, CSF: 67 mg/dL — ABNORMAL HIGH (ref 15–45)

## 2017-09-18 LAB — URIC ACID: Uric Acid, Serum: 6.7 mg/dL (ref 4.4–7.6)

## 2017-09-18 LAB — HEPATITIS B CORE ANTIBODY, IGM: HEP B C IGM: NEGATIVE

## 2017-09-18 LAB — GLUCOSE, CSF: GLUCOSE CSF: 56 mg/dL (ref 40–70)

## 2017-09-18 LAB — MAGNESIUM: MAGNESIUM: 1.4 mg/dL — AB (ref 1.7–2.4)

## 2017-09-18 MED ORDER — EPINEPHRINE PF 1 MG/10ML IJ SOSY: 0.2500 mg | PREFILLED_SYRINGE | Freq: Once | INTRAMUSCULAR | Status: DC | PRN

## 2017-09-18 MED ORDER — DOXORUBICIN HCL CHEMO IV INJECTION 2 MG/ML
25.0000 mg/m2 | Freq: Once | INTRAVENOUS | Status: AC
  Administered 2017-09-18: 52 mg via INTRAVENOUS
  Filled 2017-09-18: qty 26

## 2017-09-18 MED ORDER — SODIUM CHLORIDE 0.9 % IV SOLN
2.0000 g | Freq: Once | INTRAVENOUS | Status: AC
  Administered 2017-09-18: 2 g via INTRAVENOUS
  Filled 2017-09-18: qty 20

## 2017-09-18 MED ORDER — DEXAMETHASONE SODIUM PHOSPHATE 10 MG/ML IJ SOLN: 10.0000 mg | Freq: Once | INTRAMUSCULAR | Status: DC

## 2017-09-18 MED ORDER — MAGNESIUM SULFATE 2 GM/50ML IV SOLN: 2.0000 g | Freq: Once | INTRAVENOUS | Status: DC

## 2017-09-18 MED ORDER — DIPHENHYDRAMINE HCL 50 MG/ML IJ SOLN: 25.0000 mg | Freq: Once | INTRAMUSCULAR | Status: DC | PRN

## 2017-09-18 MED ORDER — DIPHENHYDRAMINE HCL 50 MG PO CAPS
50.0000 mg | ORAL_CAPSULE | Freq: Once | ORAL | Status: AC
  Administered 2017-09-18: 50 mg via ORAL
  Filled 2017-09-18: qty 1

## 2017-09-18 MED ORDER — ALTEPLASE 2 MG IJ SOLR
2.0000 mg | Freq: Once | INTRAMUSCULAR | Status: DC | PRN
  Filled 2017-09-18: qty 2

## 2017-09-18 MED ORDER — HEPARIN SOD (PORK) LOCK FLUSH 100 UNIT/ML IV SOLN: 500.0000 [IU] | Freq: Once | INTRAVENOUS | Status: DC | PRN

## 2017-09-18 MED ORDER — POTASSIUM CHLORIDE 10 MEQ/50ML IV SOLN
10.0000 meq | INTRAVENOUS | Status: AC
  Administered 2017-09-18 – 2017-09-19 (×8): 10 meq via INTRAVENOUS
  Filled 2017-09-18 (×11): qty 50

## 2017-09-18 MED ORDER — METHYLPREDNISOLONE SODIUM SUCC 125 MG IJ SOLR: 125.0000 mg | Freq: Once | INTRAMUSCULAR | Status: DC | PRN

## 2017-09-18 MED ORDER — SODIUM CHLORIDE 0.9 % IJ SOLN
Freq: Once | INTRAMUSCULAR | Status: AC
  Administered 2017-09-18: 13:00:00 via INTRATHECAL
  Filled 2017-09-18: qty 0.48

## 2017-09-18 MED ORDER — PREDNISONE 20 MG PO TABS
40.0000 mg | ORAL_TABLET | Freq: Every day | ORAL | Status: AC
  Administered 2017-09-18 – 2017-09-22 (×5): 40 mg via ORAL
  Filled 2017-09-18 (×5): qty 2

## 2017-09-18 MED ORDER — FAMOTIDINE IN NACL 20-0.9 MG/50ML-% IV SOLN: 20.0000 mg | Freq: Once | INTRAVENOUS | Status: DC | PRN

## 2017-09-18 MED ORDER — ALBUTEROL SULFATE (2.5 MG/3ML) 0.083% IN NEBU: 2.5000 mg | INHALATION_SOLUTION | Freq: Once | RESPIRATORY_TRACT | Status: DC | PRN

## 2017-09-18 MED ORDER — SODIUM CHLORIDE 0.9 % IV SOLN: Freq: Once | INTRAVENOUS | Status: DC | PRN

## 2017-09-18 MED ORDER — POTASSIUM CHLORIDE CRYS ER 20 MEQ PO TBCR: 30.0000 meq | EXTENDED_RELEASE_TABLET | ORAL | Status: DC

## 2017-09-18 MED ORDER — POTASSIUM CHLORIDE CRYS ER 20 MEQ PO TBCR
30.0000 meq | EXTENDED_RELEASE_TABLET | ORAL | Status: AC
  Administered 2017-09-18 (×2): 30 meq via ORAL
  Filled 2017-09-18 (×2): qty 1

## 2017-09-18 MED ORDER — POTASSIUM CHLORIDE 20 MEQ/15ML (10%) PO SOLN
40.0000 meq | ORAL | Status: DC
  Filled 2017-09-18 (×2): qty 30

## 2017-09-18 MED ORDER — LIDOCAINE HCL 1 % IJ SOLN
INTRAMUSCULAR | Status: AC
  Filled 2017-09-18: qty 20

## 2017-09-18 MED ORDER — MAGNESIUM SULFATE 2 GM/50ML IV SOLN
2.0000 g | Freq: Once | INTRAVENOUS | Status: AC
  Administered 2017-09-18: 2 g via INTRAVENOUS
  Filled 2017-09-18: qty 50

## 2017-09-18 MED ORDER — HEPARIN SOD (PORK) LOCK FLUSH 100 UNIT/ML IV SOLN: 250.0000 [IU] | Freq: Once | INTRAVENOUS | Status: DC | PRN

## 2017-09-18 MED ORDER — SODIUM CHLORIDE 0.9 % IV SOLN: 1.0000 g | Freq: Once | INTRAVENOUS | Status: DC

## 2017-09-18 MED ORDER — POTASSIUM CHLORIDE 10 MEQ/50ML IV SOLN: 10.0000 meq | INTRAVENOUS | Status: DC

## 2017-09-18 MED ORDER — ACETAMINOPHEN 325 MG PO TABS
650.0000 mg | ORAL_TABLET | Freq: Once | ORAL | Status: AC
  Administered 2017-09-18: 650 mg via ORAL
  Filled 2017-09-18: qty 2

## 2017-09-18 MED ORDER — EPINEPHRINE PF 1 MG/ML IJ SOLN
0.5000 mg | Freq: Once | INTRAMUSCULAR | Status: DC | PRN
  Filled 2017-09-18: qty 1

## 2017-09-18 MED ORDER — POTASSIUM CHLORIDE 10 MEQ/50ML IV SOLN
10.0000 meq | INTRAVENOUS | Status: DC
Start: 2017-09-18 — End: 2017-09-18

## 2017-09-18 MED ORDER — DIPHENHYDRAMINE HCL 50 MG/ML IJ SOLN: 50.0000 mg | Freq: Once | INTRAMUSCULAR | Status: DC | PRN

## 2017-09-18 MED ORDER — VINCRISTINE SULFATE CHEMO INJECTION 1 MG/ML
1.0000 mg | Freq: Once | INTRAVENOUS | Status: AC
  Administered 2017-09-18: 1 mg via INTRAVENOUS
  Filled 2017-09-18: qty 1

## 2017-09-18 MED ORDER — SODIUM CHLORIDE 0.9 % IV SOLN
375.0000 mg/m2 | Freq: Once | INTRAVENOUS | Status: AC
  Administered 2017-09-18: 800 mg via INTRAVENOUS
  Filled 2017-09-18: qty 50

## 2017-09-18 MED ORDER — PALONOSETRON HCL INJECTION 0.25 MG/5ML
0.2500 mg | Freq: Once | INTRAVENOUS | Status: AC
  Administered 2017-09-18: 0.25 mg via INTRAVENOUS
  Filled 2017-09-18: qty 5

## 2017-09-18 MED ORDER — SODIUM CHLORIDE 0.9% FLUSH: 10.0000 mL | INTRAVENOUS | Status: DC | PRN

## 2017-09-18 MED ORDER — SODIUM CHLORIDE 0.9 % IV SOLN: Freq: Once | INTRAVENOUS | Status: DC

## 2017-09-18 MED ORDER — SODIUM CHLORIDE 0.9% FLUSH: 3.0000 mL | INTRAVENOUS | Status: DC | PRN

## 2017-09-18 MED ORDER — SODIUM CHLORIDE 0.9 % IV SOLN
400.0000 mg/m2 | Freq: Once | INTRAVENOUS | Status: AC
  Administered 2017-09-18: 840 mg via INTRAVENOUS
  Filled 2017-09-18: qty 42

## 2017-09-18 MED ORDER — ADULT MULTIVITAMIN W/MINERALS CH
1.0000 | ORAL_TABLET | Freq: Every day | ORAL | Status: DC
  Administered 2017-09-18 – 2017-09-25 (×8): 1 via ORAL
  Filled 2017-09-18 (×8): qty 1

## 2017-09-18 MED ORDER — SODIUM CHLORIDE 0.9 % IV SOLN
10.0000 mg | Freq: Once | INTRAVENOUS | Status: AC
  Administered 2017-09-18: 10 mg via INTRAVENOUS
  Filled 2017-09-18: qty 1

## 2017-09-18 NOTE — NC FL2 (Signed)
[place] LEVEL OF CARE SCREENING TOOL     IDENTIFICATION  Patient Name: Cory Ibarra Birthdate: [birth year] Sex: male Admission Date (Current Location): 09/17/2017  [facility] and [place] Number:  Herbalist and Address:  [facility],  [place] [address], [place]      Provider Number: [id number]  Attending Physician Name and Address:  Ardath Sax, MD  Relative Name and Phone Number:       Current Level of Care: Hospital Recommended Level of Care: [place] Prior Approval Number:    Date Approved/Denied:   PASRR Number:    Discharge Plan: SNF    Current Diagnoses: Patient Active Problem List   Diagnosis Date Noted  . Hypokalemia 09/18/2017  . Hypomagnesemia 09/18/2017  . Hypovolemia 09/17/2017  . Chemotherapy-induced nausea 09/17/2017  . Constipation 09/17/2017  . DLBCL (diffuse large B cell lymphoma) ([place]) 09/17/2017  . Hypocalcemia 09/17/2017  . Severe protein-calorie malnutrition ([place])   . Adjustment disorder with mixed anxiety and depressed mood   . Palliative care encounter   . Goals of care, counseling/discussion   . Septic shock due to Escherichia coli ([place])   . Neutropenia ([place]) 08/21/2017  . Gross hematuria   . Lactic acidosis   . Neutropenic sepsis ([place])   . Hypophosphatemia 08/20/2017  . Diffuse large B cell lymphoma ([place]) 07/03/2017  . Bony metastasis ([place]) 06/29/2017  . Chest wall mass   . 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 Closed compression fracture of L3 lumbar vertebra ([place]) 06/28/2017  . Severe back pain 06/28/2017  . Weight loss, unintentional 06/28/2017    Orientation RESPIRATION BLADDER Height & Weight     Self, Situation, Place, Time  Normal Continent, Indwelling catheter (chronic foley) Weight:   Height:     BEHAVIORAL SYMPTOMS/MOOD NEUROLOGICAL BOWEL NUTRITION STATUS      Continent Diet (See discharge summary)  AMBULATORY STATUS COMMUNICATION OF NEEDS Skin   Limited  Assist Verbally Surgical wounds, Skin abrasions, Bruising                       Personal Care Assistance Level of Assistance  Bathing, Feeding, Dressing Bathing Assistance: Limited assistance Feeding assistance: Independent Dressing Assistance: Limited assistance     Functional Limitations Info  Sight, Hearing, Speech Sight Info: Adequate Hearing Info: Adequate Speech Info: Adequate    SPECIAL CARE FACTORS FREQUENCY  PT (By licensed PT), OT (By licensed OT)     PT Frequency: 5x OT Frequency: 5x            Contractures Contractures Info: Not present    Additional Factors Info  Code Status, Allergies, Isolation Precautions Code Status Info: Partial Code Allergies Info: Bee Venom     Isolation Precautions Info: not on contact at hospital, hx of MRSA     Current Medications (09/18/2017):  This is the current hospital active medication list Current Facility-Administered Medications  Medication Dose Route Frequency Provider Last Rate Last Dose  . 0.9 %  sodium chloride infusion   Intravenous Once Perlov, Mikhail G, MD      . 0.9 %  sodium chloride infusion   Intravenous Once PRN Ardath Sax, MD      . acetaminophen (TYLENOL) tablet 650 mg  650 mg Oral Once Perlov, Mikhail Darnell Level, MD      . albuterol (PROVENTIL) (2.5 MG/3ML) 0.083% nebulizer solution 2.5 mg  2.5 mg Nebulization Once PRN Ardath Sax, MD      .  alteplase (CATHFLO ACTIVASE) injection 2 mg  2 mg Intracatheter Once PRN Ardath Sax, MD      . chlorhexidine (PERIDEX) 0.12 % solution 15 mL  15 mL Mouth Rinse BID Ardath Sax, MD   15 mL at 09/18/17 0936  . cyclophosphamide (CYTOXAN) 840 mg in sodium chloride 0.9 % 250 mL chemo infusion  400 mg/m2 (Treatment Plan Recorded) Intravenous Once Perlov, Mikhail G, MD      . dextrose 5 %-0.9 % sodium chloride infusion   Intravenous Continuous Ardath Sax, MD 100 mL/hr at 09/18/17 0800    . diphenhydrAMINE (BENADRYL) capsule 50 mg  50 mg Oral  Once Perlov, Mikhail Darnell Level, MD      . diphenhydrAMINE (BENADRYL) injection 25 mg  25 mg Intravenous Once PRN Perlov, Marinell Blight, MD      . diphenhydrAMINE (BENADRYL) injection 50 mg  50 mg Intravenous Once PRN Perlov, Marinell Blight, MD      . dronabinol (MARINOL) capsule 5 mg  5 mg Oral QAC lunch Ardath Sax, MD   5 mg at 09/18/17 1335  . [START ON 09/19/2017] enoxaparin (LOVENOX) injection 40 mg  40 mg Subcutaneous Q24H Perlov, Mikhail G, MD      . EPINEPHrine (ADRENALIN) 0.5 mg  0.5 mg Subcutaneous Once PRN Perlov, Mikhail G, MD      . EPINEPHrine (ADRENALIN) 0.5 mg  0.5 mg Subcutaneous Once PRN Perlov, Mikhail Darnell Level, MD      . EPINEPHrine (ADRENALIN) 1 MG/10ML injection 0.25 mg  0.25 mg Intravenous Once PRN Perlov, Mikhail G, MD      . EPINEPHrine (ADRENALIN) 1 MG/10ML injection 0.25 mg  0.25 mg Intravenous Once PRN Perlov, Marinell Blight, MD      . famotidine (PEPCID) IVPB 20 mg premix  20 mg Intravenous Once PRN Perlov, Mikhail Darnell Level, MD      . feeding supplement (ENSURE ENLIVE) (ENSURE ENLIVE) liquid 237 mL  237 mL Oral BID BM Perlov, Mikhail G, MD      . heparin lock flush 100 unit/mL  500 Units Intracatheter Once PRN Ardath Sax, MD      . heparin lock flush 100 unit/mL  250 Units Intracatheter Once PRN Ardath Sax, MD      . lidocaine (XYLOCAINE) 1 % (with pres) injection           . LORazepam (ATIVAN) tablet 0.5 mg  0.5 mg Oral Q6H PRN Perlov, Mikhail Darnell Level, MD      . methylPREDNISolone sodium succinate (SOLU-MEDROL) 125 mg/2 mL injection 125 mg  125 mg Intravenous Once PRN Perlov, Marinell Blight, MD      . multivitamin with minerals tablet 1 tablet  1 tablet Oral Daily Perlov, Mikhail G, MD      . OLANZapine zydis (ZYPREXA) disintegrating tablet 2.5 mg  2.5 mg Oral QHS Ardath Sax, MD   2.5 mg at 09/17/17 2132  . ondansetron (ZOFRAN) tablet 8 mg  8 mg Oral Q8H PRN Perlov, Mikhail Darnell Level, MD      . oxyCODONE (Oxy IR/ROXICODONE) immediate release tablet 10 mg  10 mg Oral Q4H PRN Ardath Sax, MD    10 mg at 09/18/17 1338  . oxyCODONE (OXYCONTIN) 12 hr tablet 10 mg  10 mg Oral Q12H Ardath Sax, MD   10 mg at 09/18/17 0936  . pantoprazole (PROTONIX) EC tablet 40 mg  40 mg Oral Q1200 Ardath Sax, MD   40 mg at 09/18/17 1335  . polyethylene glycol (MIRALAX /  GLYCOLAX) packet 17 g  17 g Oral Daily PRN Perlov, Mikhail G, MD      . potassium chloride (K-DUR,KLOR-CON) CR tablet 30 mEq  30 mEq Oral Q4H Green, Terri L, RPH      . potassium chloride 10 mEq in 50 mL *CENTRAL LINE* IVPB  10 mEq Intravenous Q1H Ardath Sax, MD 50 mL/hr at 09/18/17 1419 10 mEq at 09/18/17 1419  . predniSONE (DELTASONE) tablet 40 mg  40 mg Oral Q breakfast Ardath Sax, MD   40 mg at 09/18/17 1348  . prochlorperazine (COMPAZINE) tablet 10 mg  10 mg Oral Q6H PRN Perlov, Mikhail Darnell Level, MD      . riTUXimab (RITUXAN) 800 mg in sodium chloride 0.9 % 250 mL (2.4242 mg/mL) chemo infusion  375 mg/m2 (Treatment Plan Recorded) Intravenous Once Perlov, Mikhail Darnell Level, MD      . sodium chloride flush (NS) 0.9 % injection 10 mL  10 mL Intracatheter PRN Perlov, Mikhail G, MD      . sodium chloride flush (NS) 0.9 % injection 10-40 mL  10-40 mL Intracatheter PRN Perlov, Mikhail G, MD      . sodium chloride flush (NS) 0.9 % injection 3 mL  3 mL Intravenous PRN Perlov, Mikhail G, MD      . vinCRIStine (ONCOVIN) 1 mg in sodium chloride 0.9 % 50 mL chemo infusion  1 mg Intravenous Once Perlov, Marinell Blight, MD       Facility-Administered Medications Ordered in Other Encounters  Medication Dose Route Frequency Provider Last Rate Last Dose  . sodium chloride flush (NS) 0.9 % injection 10 mL  10 mL Intracatheter PRN Ardath Sax, MD   10 mL at 08/09/17 1702     Discharge Medications: Please see discharge summary for a list of discharge medications.  Relevant Imaging Results:  Relevant Lab Results:   Additional Information ssn:[phone]  Lilly Cove, LCSW

## 2017-09-18 NOTE — Telephone Encounter (Signed)
Scheduled appts per 10/15 sch msg. Patient is aware of the appts.

## 2017-09-18 NOTE — Progress Notes (Signed)
Chemo dosages and calculations checked with Aloha Gell RN,

## 2017-09-18 NOTE — Progress Notes (Signed)
Initial Nutrition Assessment  DOCUMENTATION CODES:   Severe malnutrition in context of chronic illness, Underweight  INTERVENTION:   Monitor magnesium, potassium, and phosphorus daily for at least 3 days, MD to replete as needed, as pt is at risk for refeeding syndrome given severe malnutrition, poor PO intake and low levels of Mg, Phos, and K.  Once diet advanced: -Continue Ensure Enlive po BID, each supplement provides 350 kcal and 20 grams of protein -Provide Magic cup TID with meals, each supplement provides 290 kcal and 9 grams of protein -Provide Multivitamin with minerals daily  If patient's PO intake does not improve, may need to consider nutrition support given pt's continued weight loss.  NUTRITION DIAGNOSIS:   Malnutrition (severe) related to cancer and cancer related treatments, chronic illness as evidenced by percent weight loss, energy intake < or equal to 75% for > or equal to 1 month, moderate depletion of body fat, mild depletion of muscle mass.  GOAL:   Patient will meet greater than or equal to 90% of their needs  MONITOR:   Diet advancement, Labs, Weight trends, I & O's  REASON FOR ASSESSMENT:   Consult Assessment of nutrition requirement/status  ASSESSMENT:   56 y.o. 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 Patient is undergoing systemic chemotherapy with R-CHOP combination, having received the initial cycle five weeks ago. 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 Patient suffered protracted recovery due to severe malnutrition, failure to thrive, and severe deconditioning. Finally, he has been able to return to the clinic to consider the second cycle of therapy.Due to severe deterioration of performance status from a ECOG 1 down to ECOG  3, Decision was made to attenuate his next cycle of chemotherapy as outlined below. In addition, presentation in the clinic patient was found to be hypotensive and tachycardic, and IV fluids were administered. Patient's blood pressure and heart rate responded positively to the first letter fluids, but due to prolonged administration of fluids, and chemotherapy was unable to proceed as outpatient.   Patient lying flat in bed. States "I just had a spinal tap. I have to lay like this for 4 hours". Pt currently NPO. Pt reports eating a little better since recent discharge in September. Pt states he continues to have no appetite and no taste at all. He forces himself to eat. He has not eaten since admission here 10/15. Will continue to monitor PO intakes. Pt agreed to try Ensure supplements once he was able to eat.   Per chart review, pt has lost 67 lb since 7/31 (33% wt loss x 25 months, significant for time frame). Nutrition-Focused physical exam completed. Findings are severe fat depletion, mild muscle depletion, and mild edema.   Medications: Protonix tablet daily, KCl solution every 4 hours, IV Decadron once, D5 -.9% NaCl infusion at 100 ml/hr-provides 408 kcal, IV KCl Labs reviewed: Low K, Mg, Phos  Diet Order:  Diet NPO time specified  Skin:  Reviewed, no issues  Last BM:  PTA  Height:   Ht Readings from Last 1 Encounters:  09/17/17 6\' 2"  (1.88 m)    Weight:   Wt Readings from Last 1 Encounters:  09/17/17 136 lb 8 oz (61.9 kg)    Ideal Body Weight:  86.3 kg  BMI:  There is no height or weight on file to  calculate BMI.  Estimated Nutritional Needs:   Kcal:  2200-2400  Protein:  105-115g  Fluid:  2.4L/day  EDUCATION NEEDS:   No education needs identified at this time  Clayton Bibles, MS, RD, [facility] Dietitian Pager: [phone] After Hours Pager: [phone]

## 2017-09-18 NOTE — Progress Notes (Addendum)
CRITICAL VALUE ALERT  Critical Value:  Potassium" 2.6, Calcium: 5.9  Date & Time Notied:  09/18/2017; 6237  Provider Notified: MD Irene Limbo  Orders Received/Actions taken: awaiting further action.

## 2017-09-18 NOTE — Progress Notes (Addendum)
PT Cancellation Note  Patient Details Name: Jujuan Dugo MRN: [id number] DOB: Nov 04, 1961   Cancelled Treatment:    Reason Eval/Treat Not Completed: Medical issues which prohibited therapy (per RN, pt on bedrest following procedure, will follow. )   Philomena Doheny 09/18/2017, 1:26 PM [phone]

## 2017-09-18 NOTE — Progress Notes (Signed)
IP PROGRESS NOTE  Subjective:  No acute events overnight. She reports no new symptoms. Patient underwent intrathecal methotrexate without significant immediate complications. Currently receiving his cytotoxic chemotherapy.  Objective: Vital signs in last 24 hours: Blood pressure (!) 100/56, pulse 93, temperature 97.8 F (36.6 C), temperature source Axillary, resp. rate 16, SpO2 100 %.  Intake/Output from previous day: 10/15 0701 - 10/16 0700 In: 100 [P.O.:100] Out: 180 [Urine:180]  Physical Exam:  Patient appears to be slightly stronger than yesterday. Awake, alert, oriented in no acute distress. HEENT: PERRLA, EOMI, moist mucous membranes Lungs: clear to auscultation bilaterally, no expiratory wheezing Cardiac: improved heart rate, no murmurs. Abdomen: soft, nontender and nondistended. Extremities: no peripheral edema   Portacath/PICC-without erythema  Lab Results:  Recent Labs  09/17/17 1052 09/18/17 0551  WBC 10.4* 7.3  HGB 11.6* 9.2*  HCT 35.4* 27.7*  PLT 453* 336    BMET  Recent Labs  09/17/17 1052 09/18/17 0551  NA 135* 136  K 3.5 2.6*  CL  --  99*  CO2 19* 24  GLUCOSE 108 103*  BUN 7.3 10  CREATININE 0.5* 0.38*  CALCIUM 6.4* 5.9*    Lab Results  Component Value Date   CEA1 0.7 06/28/2017    Studies/Results: Dg Fluoro Guided Loc Of Needle/cath Tip For Spinal Inject Lt  Result Date: 09/18/2017 CLINICAL DATA:  B-cell lymphoma.  For intrathecal chemotherapy EXAM: DIAGNOSTIC LUMBAR PUNCTURE UNDER FLUOROSCOPIC GUIDANCE FLUOROSCOPY TIME:  Fluoroscopy Time:  0 minutes 16 second Radiation Exposure Index (if provided by the fluoroscopic device): Number of Acquired Spot Images: 0 PROCEDURE: Informed consent was obtained from the patient prior to the procedure, including potential complications of headache, allergy, and pain. With the patient prone, the lower back was prepped with Betadine. 1% Lidocaine was used for local anesthesia. Lumbar puncture was  performed at the L4-5 level using a 20 gauge needle with return of slightly blood-tinged CSF which cleared, with an opening pressure of 9 cm water. 13.5 ml of CSF were obtained for laboratory studies. 12 mg of methotrexate was injected into the subarachnoid space. The patient tolerated the procedure well and there were no apparent complications. IMPRESSION: Successful lumbar puncture and injection of intrathecal methotrexate Electronically Signed   By: Franchot Gallo M.D.   On: 09/18/2017 13:24    Medications: I have reviewed the patient's current medications.  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 At the present time, patient has recovered sufficiently to considering initiation of therapy. He remains insistent on continuing to pursue systemic therapy for his malignancy. At this time, he's not a candidate for full dose R-CHOP, but mini-R-CHOP can be considered Having been shown to be reasonably effective and significantly less toxic when used in elderly patients. Change in the therapy has been discussed with the patient extensively. He understands that his chance of curative outcome have been diminished, but is willing to proceed with the current therapy. In addition to those reducing his chemotherapy, I will add prophylactic methotrexate injection intrathecally for which the patient scheduled tomorrow. Based on severe infectious complications of the first cycle, this treatment will be administered with Neulasta support.  **DLBCL: reasonably advanced this is with management complicated by patient's performance standards as outlined  above. --Proceed with IT  MTX today --Systemic Chemoimmunotherapy after MTX dose --Neulasta 24-72 hrs after systemic chemotherapy  **VOlume/Electrolytes: Hypovolemia most likely related to poor oral intake despite the patient's assurance of drinking lots of fluids. He clearly response to parenteral fluid supplementation and will continue that overnight. Additionally, patient continues to have significant decreased calcium level. This is partially attributable to hyperbilirubinemia, but also to the effect of previously administered denosumab. Patient has been receiving D5 normal saline overnight in 100 mL per hour. This morning, K 2.6, Mg 1.4, Ca 5.9 (Alb 2.3) --Replace calcium, potassium and magnesium  **Diet/Activity: --Continue PT/OT/Nutritional support  **PPx: --SQ enoxaparin prophylaxis --Decubitus ppx, will likely consult Wound Nurse tomorrow for additional input --PPI for stress ulcer prophylaxis  **Code Status: Modified code  **Disposition: patient will complete systemic chemotherapy and rituximab pleasant patient. At this time, he is to remain hospitalized due to acute electrolyte abnormalities that need inpatient correction.     LOS: 1 day   Ardath Sax, MD   09/18/2017, 4:24 PM

## 2017-09-18 NOTE — Clinical Social Work Note (Signed)
Clinical Social Work Assessment  Patient Details  Name: Cory Ibarra MRN: [id number] Date of Birth: May 19, 1961  Date of referral:  09/18/17               Reason for consult:  Discharge Planning                Permission sought to share information with:  Case Manager, Facility Sport and exercise psychologist, Family Supports Permission granted to share information::  Yes, Verbal Permission Granted  Name::     brother, Mcguire Gasparyan  Agency::  [facility]  Relationship::  Brother  Sport and exercise psychologist Information:     Housing/Transportation Living arrangements for the past 2 months:  [place] of Information:  Patient Patient Interpreter Needed:  None Criminal Activity/Legal Involvement Pertinent to Current Situation/Hospitalization:  No - Comment as needed Significant Relationships:  Other Family Members, [employer] Lives with:  Facility Resident Do you feel safe going back to the place where you live?  Yes Need for family participation in patient care:  Yes (Comment)  Care giving concerns:  Patient admitted from SNF, [facility] for Hypovolemia, inpatient administration of systemic chemotherapy. There are no care giving concerns at the time of assessment.    Social Worker assessment / plan:  LCSW consulted as patient is from SNF facility/discharge planning.  Patient is aware of current diagnosis and reason for current admisson to the hospital.  Patient reports that he is receiving restorative therapy at the facility to assist with getting out of bed and walking. Patient is currently using a walker.   Patient reports that he is able to feed himself, however he has not had an appetite.   Plan: Patient will return to SNF at [place].   Employment status:  Disabled (Comment on whether or not currently receiving Disability) Insurance information:  Medicaid In [place] PT Recommendations:  Not assessed at this time Information / Referral to community resources:     none  Patient/Family's Response to care: Patient is agreeable to returning to [facility] and discharge. Patient stated that he has excellent family support.    Patient/Family's Understanding of and Emotional Response to Diagnosis, Current Treatment, and Prognosis:    Patient is aware of current diagnosis and reason for current admission.  Patient was trying to processes reasoning for current decline in mobility. Patient is hopefully that with therapy his mobility will improve.   Emotional Assessment Appearance:  Appears stated age Attitude/Demeanor/Rapport:    Affect (typically observed):  Accepting, Calm, Pleasant Orientation:  Oriented to Self, Oriented to Place, Oriented to  Time, Oriented to Situation Alcohol / Substance use:  Not Applicable Psych involvement (Current and /or in the community):  No (Comment)  Discharge Needs  Concerns to be addressed:  No discharge needs identified Readmission within the last 30 days:  Yes Current discharge risk:  None Barriers to Discharge:  Continued Medical Work up, No Barriers Identified   Lilly Cove, LCSW 09/18/2017, 2:21 PM

## 2017-09-18 NOTE — Progress Notes (Signed)
Patient tolerated chemo well, portacath remained with good blood return.

## 2017-09-18 NOTE — Progress Notes (Addendum)
PT Cancellation Note  Patient Details Name: Cory Ibarra MRN: [id number] DOB: 1961-02-12   Cancelled Treatment:    Reason Eval/Treat Not Completed: Medical issues which prohibited therapy. Potassium 2.6 (pt to get 8 runs of K+ today, 1st run not yet given)  pt stated he doesn't feel well enough to attempt sitting up 2* dizziness with position changes. Encouraged to pt elevate HOB. Will follow.    Blondell Reveal Kistler 09/18/2017, 10:01 AM [phone]

## 2017-09-18 NOTE — Progress Notes (Signed)
OT Cancellation Note  Patient Details Name: Cory Ibarra MRN: [id number] DOB: February 28, 1961   Cancelled Treatment:    Reason Eval/Treat Not Completed: Medical issues which prohibited therapy. Pt with 2.6 K+.  Will check back tomorrow.  Debbera Wolken 09/18/2017, 11:43 AM  Lesle Chris, OTR/L [phone] 09/18/2017

## 2017-09-19 ENCOUNTER — Ambulatory Visit: Payer: Self-pay

## 2017-09-19 ENCOUNTER — Ambulatory Visit (HOSPITAL_COMMUNITY): Payer: Medicaid Other

## 2017-09-19 DIAGNOSIS — N200 Calculus of kidney: Secondary | ICD-10-CM | POA: Insufficient documentation

## 2017-09-19 DIAGNOSIS — T83032A Leakage of nephrostomy catheter, initial encounter: Secondary | ICD-10-CM | POA: Insufficient documentation

## 2017-09-19 DIAGNOSIS — M545 Low back pain: Secondary | ICD-10-CM

## 2017-09-19 DIAGNOSIS — K219 Gastro-esophageal reflux disease without esophagitis: Secondary | ICD-10-CM | POA: Insufficient documentation

## 2017-09-19 DIAGNOSIS — Z936 Other artificial openings of urinary tract status: Secondary | ICD-10-CM | POA: Insufficient documentation

## 2017-09-19 LAB — COMPREHENSIVE METABOLIC PANEL
ALT: 10 U/L — ABNORMAL LOW (ref 17–63)
ANION GAP: 10 (ref 5–15)
AST: 21 U/L (ref 15–41)
Albumin: 2.2 g/dL — ABNORMAL LOW (ref 3.5–5.0)
Alkaline Phosphatase: 131 U/L — ABNORMAL HIGH (ref 38–126)
BILIRUBIN TOTAL: 1 mg/dL (ref 0.3–1.2)
BUN: 5 mg/dL — AB (ref 6–20)
CHLORIDE: 104 mmol/L (ref 101–111)
CO2: 24 mmol/L (ref 22–32)
Calcium: 5.8 mg/dL — CL (ref 8.9–10.3)
Creatinine, Ser: 0.31 mg/dL — ABNORMAL LOW (ref 0.61–1.24)
GFR calc Af Amer: >60 mL/min (ref >60)
Glucose, Bld: 140 mg/dL — ABNORMAL HIGH (ref 65–99)
POTASSIUM: 3.8 mmol/L (ref 3.5–5.1)
Sodium: 138 mmol/L (ref 135–145)
TOTAL PROTEIN: 5.2 g/dL — AB (ref 6.5–8.1)

## 2017-09-19 LAB — CBC WITH DIFFERENTIAL/PLATELET
Basophils Absolute: 0 10*3/uL (ref 0.0–0.1)
Basophils Relative: 0 %
Eosinophils Absolute: 0 10*3/uL (ref 0.0–0.7)
Eosinophils Relative: 0 %
HCT: 27 % — ABNORMAL LOW (ref 39.0–52.0)
Hemoglobin: 8.8 g/dL — ABNORMAL LOW (ref 13.0–17.0)
Lymphocytes Relative: 8 %
Lymphs Abs: 0.4 10*3/uL — ABNORMAL LOW (ref 0.7–4.0)
MCH: 24.6 pg — ABNORMAL LOW (ref 26.0–34.0)
MCHC: 32.6 g/dL (ref 30.0–36.0)
MCV: 75.6 fL — ABNORMAL LOW (ref 78.0–100.0)
Monocytes Absolute: 0.1 10*3/uL (ref 0.1–1.0)
Monocytes Relative: 3 %
Neutro Abs: 4 10*3/uL (ref 1.7–7.7)
Neutrophils Relative %: 89 %
Platelets: 252 10*3/uL (ref 150–400)
RBC: 3.57 MIL/uL — ABNORMAL LOW (ref 4.22–5.81)
RDW: 20 % — ABNORMAL HIGH (ref 11.5–15.5)
WBC: 4.5 10*3/uL (ref 4.0–10.5)

## 2017-09-19 LAB — MAGNESIUM: Magnesium: 1.5 mg/dL — ABNORMAL LOW (ref 1.7–2.4)

## 2017-09-19 LAB — PHOSPHORUS: PHOSPHORUS: 2 mg/dL — AB (ref 2.5–4.6)

## 2017-09-19 LAB — URIC ACID: Uric Acid, Serum: 4.8 mg/dL (ref 4.4–7.6)

## 2017-09-19 LAB — MRSA PCR SCREENING: MRSA BY PCR: NEGATIVE

## 2017-09-19 MED ORDER — SODIUM CHLORIDE 0.9 % IV SOLN
2.0000 g | Freq: Once | INTRAVENOUS | Status: AC
  Administered 2017-09-19: 2 g via INTRAVENOUS
  Filled 2017-09-19: qty 20

## 2017-09-19 MED ORDER — OLANZAPINE 5 MG PO TABS
2.5000 mg | ORAL_TABLET | Freq: Every day | ORAL | Status: DC
  Administered 2017-09-19 – 2017-09-24 (×6): 2.5 mg via ORAL
  Filled 2017-09-19 (×6): qty 1

## 2017-09-19 MED ORDER — SODIUM PHOSPHATES 45 MMOLE/15ML IV SOLN
30.0000 mmol | Freq: Once | INTRAVENOUS | Status: AC
  Administered 2017-09-19: 30 mmol via INTRAVENOUS
  Filled 2017-09-19: qty 10

## 2017-09-19 MED ORDER — MAGNESIUM SULFATE 2 GM/50ML IV SOLN
2.0000 g | Freq: Once | INTRAVENOUS | Status: AC
  Administered 2017-09-19: 2 g via INTRAVENOUS
  Filled 2017-09-19: qty 50

## 2017-09-19 NOTE — Evaluation (Signed)
Occupational Therapy Evaluation Patient Details Name: Cory Ibarra MRN: [id number] DOB: Mar 29, 1961 Today's Date: 09/19/2017    History of Present Illness 56 y.o. 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 Unable to tolerate outpatient chemo. Recent admission in 08/21/17-09/03/17 for septic shock following 1st round of chemo.    Clinical Impression   Pt was admitted for the above.  He was limited by dizziness for standing but participated in OT evaluation from seated level.  Pt engaged and motivated.  He will benefit from continued OT to increase strength and activity tolerance for adls.  Goals are for min A overall (+2 for transfers).  He needs up to max A at this time    Follow Up Recommendations  SNF    Equipment Recommendations  None recommended by OT    Recommendations for Other Services       Precautions / Restrictions Precautions Precautions: Fall Precaution Comments: dizziness with sitting/standing Restrictions Weight Bearing Restrictions: No      Mobility Bed Mobility               General bed mobility comments: up in recliner  Transfers     Transfers: Sit to/from Stand Sit to Stand: Mod assist         General transfer comment: not performed by OT.  Pt performed as above    Balance                                           ADL either performed or assessed with clinical judgement   ADL   Eating/Feeding: Independent   Grooming: Set up;Sitting   Upper Body Bathing: Minimal assistance;Sitting   Lower Body Bathing: Maximal assistance;Sitting/lateral leans   Upper Body Dressing : Minimal assistance;Sitting   Lower Body Dressing: Maximal assistance;Sit to/from stand                 General ADL Comments: pt dizzy when standing. Pt could not get a BP in standing and he needs +2 for sit to stand.  Pt is able to  laterally lean for pressure relief.  He can cross LLE over R knee to reach for adls.  Unable to do the same with R. Would benefit from AE, if he is interested.  Pt s/p lumbar puncture yesterday and very sore today     Vision         Perception     Praxis      Pertinent Vitals/Pain Pain Assessment: No/denies pain     Hand Dominance     Extremity/Trunk Assessment Upper Extremity Assessment Upper Extremity Assessment: Generalized weakness      Cervical / Trunk Assessment Cervical / Trunk Assessment: Kyphotic   Communication Communication Communication: No difficulties   Cognition Arousal/Alertness: Awake/alert Behavior During Therapy: WFL for tasks assessed/performed (bright affect) Overall Cognitive Status: Within Functional Limits for tasks assessed                                     General Comments       Exercises    Shoulder Instructions      Home Living Family/patient expects to be discharged to:: Skilled nursing facility  Home Equipment: Walker - 2 wheels   Additional Comments: was in rehab      Prior Functioning/Environment Level of Independence: Needs assistance  Gait / Transfers Assistance Needed: walked short distances with RW and assist during PT at SNF ADL's / Homemaking Assistance Needed: needed assist for adls            OT Problem List: Decreased strength;Decreased activity tolerance;Pain;Decreased knowledge of use of DME or AE;Impaired balance (sitting and/or standing)      OT Treatment/Interventions: Self-care/ADL training;DME and/or AE instruction;Energy conservation;Therapeutic exercise;Patient/family education;Balance training    OT Goals(Current goals can be found in the care plan section) Acute Rehab OT Goals Patient Stated Goal: to walk, go home OT Goal Formulation: With patient Time For Goal Achievement: 09/26/17 Potential to Achieve Goals: Good ADL Goals Pt Will  Perform Lower Body Bathing: with min assist;with adaptive equipment;sit to/from stand;sitting/lateral leans Pt Will Perform Lower Body Dressing: with min assist;with adaptive equipment;sitting/lateral leans;sit to/from stand Pt Will Transfer to Toilet: with min assist;with +2 assist;bedside commode;stand pivot transfer Additional ADL Goal #1: pt will perform 10 reps shoulder and elbow exercises with level one theraband with supervision and written HEP  OT Frequency: Min 2X/week   Barriers to D/C:            Co-evaluation              AM-PAC PT "6 Clicks" Daily Activity     Outcome Measure Help from another person eating meals?: None Help from another person taking care of personal grooming?: A Little Help from another person toileting, which includes using toliet, bedpan, or urinal?: A Lot Help from another person bathing (including washing, rinsing, drying)?: A Lot Help from another person to put on and taking off regular upper body clothing?: A Little Help from another person to put on and taking off regular lower body clothing?: A Lot 6 Click Score: 16   End of Session    Activity Tolerance: Patient tolerated treatment well (seated level) Patient left: in chair;with call bell/phone within reach;with family/visitor present  OT Visit Diagnosis: Muscle weakness (generalized) (M62.81)                Time: 9935-7017 OT Time Calculation (min): 23 min Charges:  OT General Charges $OT Visit: 1 Visit OT Evaluation $OT Eval Moderate Complexity: 1 Mod G-Codes:     [place], OTR/L [phone] 09/19/2017  Dominiq Fontaine 09/19/2017, 12:47 PM

## 2017-09-19 NOTE — Progress Notes (Signed)
IP PROGRESS NOTE  Subjective:  No acute events overnight. Some pain in the lumbar area at the site of LP. 2 yesterday. His activity level is improved and he has got some appetite back.denies any headaches, vision changes. No new neurological deficits. Denies any nausea, abdominal pain, diarrhea, or constipation. No hematuria.  Objective: Vital signs in last 24 hours: Blood pressure 101/62, pulse (!) 109, temperature 97.9 F (36.6 C), temperature source Oral, resp. rate 18, SpO2 98 %.  Intake/Output from previous day: 10/16 0701 - 10/17 0700 In: 2362 [P.O.:120; I.V.:1200] Out: 3450 [Urine:2250; Drains:1200]  Physical Exam:  Patient appears to be slightly stronger than yesterday. Awake, alert, oriented in no acute distress. HEENT: PERRLA, EOMI, moist mucous membranes Lungs: clear to auscultation bilaterally, no expiratory wheezing Cardiac: improved heart rate, no murmurs. Abdomen: soft, nontender and nondistended. Extremities: no peripheral edema   Portacath/PICC-without erythema. Right-sided percutaneous nephrostomy in place with clear urine. Foley catheter is in place draining clear urine.  Lab Results:  Recent Labs  09/18/17 0551 09/19/17 0308  WBC 7.3 4.5  HGB 9.2* 8.8*  HCT 27.7* 27.0*  PLT 336 252    BMET  Recent Labs  09/18/17 0551 09/19/17 0308  NA 136 138  K 2.6* 3.8  CL 99* 104  CO2 24 24  GLUCOSE 103* 140*  BUN 10 5*  CREATININE 0.38* 0.31*  CALCIUM 5.9* 5.8*    Lab Results  Component Value Date   CEA1 0.7 06/28/2017    Studies/Results: Dg Fluoro Guided Loc Of Needle/cath Tip For Spinal Inject Lt  Result Date: 09/18/2017 CLINICAL DATA:  B-cell lymphoma.  For intrathecal chemotherapy EXAM: DIAGNOSTIC LUMBAR PUNCTURE UNDER FLUOROSCOPIC GUIDANCE FLUOROSCOPY TIME:  Fluoroscopy Time:  0 minutes 16 second Radiation Exposure Index (if provided by the fluoroscopic device): Number of Acquired Spot Images: 0 PROCEDURE: Informed consent was obtained from  the patient prior to the procedure, including potential complications of headache, allergy, and pain. With the patient prone, the lower back was prepped with Betadine. 1% Lidocaine was used for local anesthesia. Lumbar puncture was performed at the L4-5 level using a 20 gauge needle with return of slightly blood-tinged CSF which cleared, with an opening pressure of 9 cm water. 13.5 ml of CSF were obtained for laboratory studies. 12 mg of methotrexate was injected into the subarachnoid space. The patient tolerated the procedure well and there were no apparent complications. IMPRESSION: Successful lumbar puncture and injection of intrathecal methotrexate Electronically Signed   By: Franchot Gallo M.D.   On: 09/18/2017 13:24    Medications: I have reviewed the patient's current medications.  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 At the present time, patient has recovered sufficiently to considering initiation of therapy. He remains insistent on continuing to pursue systemic therapy for his malignancy. At this time, he's not a candidate for full dose R-CHOP, but mini-R-CHOP can be considered Having been shown to be reasonably effective and significantly less toxic when used in elderly patients. Change in the therapy has been discussed with the patient extensively. He understands that his chance of curative outcome have been diminished, but is willing to proceed with the current therapy. In addition to those reducing his chemotherapy, I will add prophylactic methotrexate injection intrathecally for which  the patient scheduled tomorrow. Based on  severe infectious complications of the first cycle, this treatment will be administered with Neulasta support.  **DLBCL: reasonably advanced this is with management complicated by patient's performance standards as outlined above. Patient has completed IT MTX and systemic chemotherapy without significant complications --Neulasta 24-72 hrs after systemic chemotherapy -- will likely give tomorrow  **Volume/Electrolytes: Hypovolemia most likely related to poor oral intake despite the patient's assurance of drinking lots of fluids. He clearly response to parenteral fluid supplementation and will continue that overnight. Additionally, patient continues to have significant decreased calcium level. This is partially attributable to hyperbilirubinemia, but also to the effect of previously administered denosumab. Patient has been receiving D5 normal saline overnight in 100 mL per hour, electrolyte replacement was administered yesterday. This morning, K 3.8, Mg 1.5, Ca 5.8 (Alb 2.2), Phos 2.0 --Replace calcium, Phos and magnesium  **Diet/Activity: --Continue PT/OT/Nutritional support  **PPx: --SQ enoxaparin prophylaxis --Decubitus ppx, will likely consult Wound Nurse tomorrow for additional input --PPI for stress ulcer prophylaxis  **Code Status: Modified code  **Disposition:  --will likely discharge patient tomorrow if calcium is over 6.0. --he will need Neulasta/on pro administered tomorrow after discharge --we'll need to follow up with him in the clinic in 1 week for continued close monitoring.     LOS: 2 days   Ardath Sax, MD   09/19/2017, 1:55 PM

## 2017-09-19 NOTE — Evaluation (Addendum)
Physical Therapy Evaluation Patient Details Name: Cory Ibarra MRN: [id number] DOB: 1961-07-02 Today's Date: 09/19/2017   History of Present Illness  56 y.o. 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 Unable to tolerate outpatient chemo. Recent admission in 08/21/17-09/03/17 for septic shock following 1st round of chemo.   Clinical Impression  Pt admitted with above diagnosis. Pt currently with functional limitations due to the deficits listed below (see PT Problem List). Pt got up to chair with +2 assist from nursing prior to PT eval. Attempted sit to stand, but pt unable to stand more than 1 second 2* dizziness. Seated BP 101/62, unable to stand long enough to obtain standing BP. Pt performed seated BUE/LE exercises for strengthening. HR 107 and 2/4 dyspnea with activity.  Pt will benefit from skilled PT to increase their independence and safety with mobility to allow discharge to the venue listed below.       Follow Up Recommendations SNF;Supervision/Assistance - 24 hour (pt prefers home, but doesn't have 24* assist available and he needs assist to perform transfers, he's unable to ambulate at present 2* dizziness in standing)    Equipment Recommendations  None recommended by PT    Recommendations for Other Services       Precautions / Restrictions Precautions Precautions: Fall Precaution Comments: dizziness with sitting/standing Restrictions Weight Bearing Restrictions: No      Mobility  Bed Mobility               General bed mobility comments: up in recliner -pt stated he had assist of 2 for bed to recliner transfer  Transfers     Transfers: Sit to/from Stand Sit to Stand: Mod assist         General transfer comment: pt stood for 1 second then needed to sit 2* dizziness, unable to stand long enough to get BP, HR 109 with activity, 2/4 dyspnea with  activity  Ambulation/Gait             General Gait Details: unable  Stairs            Wheelchair Mobility    Modified Rankin (Stroke Patients Only)       Balance                                             Pertinent Vitals/Pain Pain Assessment: No/denies pain    Home Living Family/patient expects to be discharged to:: [place]: Walker - 2 wheels      Prior Function Level of Independence: Needs assistance   Gait / Transfers Assistance Needed: walked short distances with RW and assist during PT at SNF           Hand Dominance        Extremity/Trunk Assessment   Upper Extremity Assessment Upper Extremity Assessment: Generalized weakness    Lower Extremity Assessment Lower Extremity Assessment: Generalized weakness (-4/5 B knee ext)    Cervical / Trunk Assessment Cervical / Trunk Assessment: Kyphotic  Communication   Communication: No difficulties  Cognition Arousal/Alertness: Awake/alert Behavior During Therapy: WFL for tasks assessed/performed Overall Cognitive Status: Within Functional Limits for tasks assessed  General Comments      Exercises General Exercises - Upper Extremity Shoulder Flexion: AROM;20 reps;Seated;Both;10 reps General Exercises - Lower Extremity Ankle Circles/Pumps: AROM;Both;15 reps;Seated Long Arc Quad: AROM;Both;10 reps;Seated Hip Flexion/Marching: AROM;Both;10 reps;Seated   Assessment/Plan    PT Assessment Patient needs continued PT services  PT Problem List Decreased strength;Decreased activity tolerance;Decreased mobility       PT Treatment Interventions DME instruction;Therapeutic activities;Therapeutic exercise;Patient/family education;Functional mobility training    PT Goals (Current goals can be found in the Care Plan section)  Acute Rehab PT Goals Patient Stated Goal: to  walk, go home PT Goal Formulation: With patient Time For Goal Achievement: 10/03/17 Potential to Achieve Goals: Fair    Frequency Min 3X/week   Barriers to discharge        Co-evaluation               AM-PAC PT "6 Clicks" Daily Activity  Outcome Measure Difficulty turning over in bed (including adjusting bedclothes, sheets and blankets)?: Unable Difficulty moving from lying on back to sitting on the side of the bed? : Unable Difficulty sitting down on and standing up from a chair with arms (e.g., wheelchair, bedside commode, etc,.)?: Unable Help needed moving to and from a bed to chair (including a wheelchair)?: Total Help needed walking in hospital room?: Total Help needed climbing 3-5 steps with a railing? : Total 6 Click Score: 6    End of Session Equipment Utilized During Treatment: Gait belt Activity Tolerance: Patient limited by fatigue Patient left: in chair;with call bell/phone within reach;with family/visitor present Nurse Communication: Mobility status PT Visit Diagnosis: Muscle weakness (generalized) (M62.81);Difficulty in walking, not elsewhere classified (R26.2)    Time: 8032-1224 PT Time Calculation (min) (ACUTE ONLY): 32 min   Charges:   PT Evaluation $PT Eval Moderate Complexity: 1 Mod PT Treatments $Therapeutic Activity: 8-22 mins   PT G Codes:          Philomena Doheny 09/19/2017, 11:18 AM [phone]

## 2017-09-20 ENCOUNTER — Ambulatory Visit: Payer: Self-pay

## 2017-09-20 LAB — CBC WITH DIFFERENTIAL/PLATELET
BASOS PCT: 0 %
Basophils Absolute: 0 10*3/uL (ref 0.0–0.1)
Eosinophils Absolute: 0 10*3/uL (ref 0.0–0.7)
Eosinophils Relative: 0 %
HEMATOCRIT: 25.4 % — AB (ref 39.0–52.0)
HEMOGLOBIN: 8.3 g/dL — AB (ref 13.0–17.0)
LYMPHS ABS: 0.5 10*3/uL — AB (ref 0.7–4.0)
Lymphocytes Relative: 11 %
MCH: 25.1 pg — AB (ref 26.0–34.0)
MCHC: 32.7 g/dL (ref 30.0–36.0)
MCV: 76.7 fL — ABNORMAL LOW (ref 78.0–100.0)
MONOS PCT: 5 %
Monocytes Absolute: 0.2 10*3/uL (ref 0.1–1.0)
NEUTROS ABS: 3.6 10*3/uL (ref 1.7–7.7)
NEUTROS PCT: 84 %
Platelets: 231 10*3/uL (ref 150–400)
RBC: 3.31 MIL/uL — AB (ref 4.22–5.81)
RDW: 20.1 % — ABNORMAL HIGH (ref 11.5–15.5)
WBC: 4.3 10*3/uL (ref 4.0–10.5)

## 2017-09-20 LAB — COMPREHENSIVE METABOLIC PANEL
ALBUMIN: 2 g/dL — AB (ref 3.5–5.0)
ALK PHOS: 129 U/L — AB (ref 38–126)
ALT: 12 U/L — AB (ref 17–63)
ANION GAP: 8 (ref 5–15)
AST: 27 U/L (ref 15–41)
BUN: <5 mg/dL — ABNORMAL LOW (ref 6–20)
CALCIUM: 5.5 mg/dL — AB (ref 8.9–10.3)
CO2: 24 mmol/L (ref 22–32)
CREATININE: 0.32 mg/dL — AB (ref 0.61–1.24)
Chloride: 108 mmol/L (ref 101–111)
GFR calc Af Amer: >60 mL/min (ref >60)
GFR calc non Af Amer: >60 mL/min (ref >60)
GLUCOSE: 347 mg/dL — AB (ref 65–99)
Potassium: 2.8 mmol/L — ABNORMAL LOW (ref 3.5–5.1)
Sodium: 140 mmol/L (ref 135–145)
TOTAL PROTEIN: 4.6 g/dL — AB (ref 6.5–8.1)
Total Bilirubin: 0.7 mg/dL (ref 0.3–1.2)

## 2017-09-20 LAB — MAGNESIUM: Magnesium: 1.6 mg/dL — ABNORMAL LOW (ref 1.7–2.4)

## 2017-09-20 LAB — URIC ACID: Uric Acid, Serum: 3.3 mg/dL — ABNORMAL LOW (ref 4.4–7.6)

## 2017-09-20 LAB — PHOSPHORUS: Phosphorus: 2.2 mg/dL — ABNORMAL LOW (ref 2.5–4.6)

## 2017-09-20 MED ORDER — TBO-FILGRASTIM 480 MCG/0.8ML ~~LOC~~ SOSY
480.0000 ug | PREFILLED_SYRINGE | Freq: Every day | SUBCUTANEOUS | Status: AC
  Administered 2017-09-20 – 2017-09-22 (×3): 480 ug via SUBCUTANEOUS
  Filled 2017-09-20 (×3): qty 0.8

## 2017-09-20 MED ORDER — POTASSIUM CHLORIDE CRYS ER 20 MEQ PO TBCR
20.0000 meq | EXTENDED_RELEASE_TABLET | Freq: Every day | ORAL | Status: DC
  Administered 2017-09-20 – 2017-09-22 (×3): 20 meq via ORAL
  Filled 2017-09-20 (×3): qty 1

## 2017-09-20 MED ORDER — POTASSIUM CHLORIDE 10 MEQ/50ML IV SOLN
10.0000 meq | INTRAVENOUS | Status: AC
  Administered 2017-09-20 (×6): 10 meq via INTRAVENOUS
  Filled 2017-09-20 (×6): qty 50

## 2017-09-20 MED ORDER — MAGNESIUM SULFATE IN D5W 1-5 GM/100ML-% IV SOLN
1.0000 g | Freq: Two times a day (BID) | INTRAVENOUS | Status: DC
  Administered 2017-09-20 – 2017-09-23 (×7): 1 g via INTRAVENOUS
  Filled 2017-09-20 (×8): qty 100

## 2017-09-20 MED ORDER — SODIUM CHLORIDE 0.9 % IV SOLN
1.0000 g | Freq: Two times a day (BID) | INTRAVENOUS | Status: DC
  Administered 2017-09-20 – 2017-09-23 (×7): 1 g via INTRAVENOUS
  Filled 2017-09-20 (×8): qty 10

## 2017-09-20 NOTE — Progress Notes (Signed)
IP PROGRESS NOTE  Subjective:  Reports feeling significantly better today, he appears to have some appetite returning to him as well. He has been able to therapy last menstrual period Objective: Vital signs in last 24 hours: Blood pressure 113/69, pulse 82, temperature 97.6 F (36.4 C), temperature source Oral, resp. rate 15, weight 145 lb 8.1 oz (66 kg), SpO2 100 %.  Intake/Output from previous day: 10/17 0701 - 10/18 0700 In: 2408.3 [P.O.:360; I.V.:1788.3; IV Piggyback:260] Out: 2400 [Urine:2150; Drains:250]  Physical Exam:  Patient appears to be slightly stronger than yesterday. Awake, alert, oriented in no acute distress. HEENT: PERRLA, EOMI, moist mucous membranes Lungs: clear to auscultation bilaterally, no expiratory wheezing Cardiac: improved heart rate, no murmurs. Abdomen: soft, nontender and nondistended. Extremities: no peripheral edema   Portacath/PICC-without erythema. Right-sided percutaneous nephrostomy in place with clear urine. Foley catheter is in place draining clear urine.  Lab Results:  Recent Labs  09/19/17 0308 09/20/17 0500  WBC 4.5 4.3  HGB 8.8* 8.3*  HCT 27.0* 25.4*  PLT 252 231    BMET  Recent Labs  09/19/17 0308 09/20/17 0500  NA 138 140  K 3.8 2.8*  CL 104 108  CO2 24 24  GLUCOSE 140* 347*  BUN 5* <5*  CREATININE 0.31* 0.32*  CALCIUM 5.8* 5.5*    Lab Results  Component Value Date   CEA1 0.7 06/28/2017    Studies/Results: No results found.  Medications: I have reviewed the patient's current medications.  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 At the present  time, patient has recovered sufficiently to considering initiation of therapy. He remains insistent on continuing to pursue systemic therapy for his malignancy. At this time, he's not a candidate for full dose R-CHOP, but mini-R-CHOP can be considered Having been shown to be reasonably effective and significantly less toxic when used in elderly patients. Change in the therapy has been discussed with the patient extensively. He understands that his chance of curative outcome have been diminished, but is willing to proceed with the current therapy. In addition to those reducing his chemotherapy, I will add prophylactic methotrexate injection intrathecally for which the patient scheduled tomorrow. Based on severe infectious complications of the first cycle, this treatment will be administered with Neulasta support.  **DLBCL: reasonably advanced this is with management complicated by patient's performance standards as outlined above. Patient has completed IT MTX and systemic chemotherapy without significant complications --Start neupogen 452mcg QDay x3-4 days to replace Neulasta while the patient is hospitalized  **Volume/Electrolytes: Hypovolemia most likely related to poor oral intake despite the patient's assurance of drinking lots of fluids. He clearly response to parenteral fluid supplementation and will continue that overnight. Additionally, patient continues to have significant decreased calcium level. This is partially attributable to hyperbilirubinemia, but also to the effect of previously administered denosumab. Patient has been receiving D5 normal saline overnight in 100 mL per hour, electrolyte replacement was administered yesterday.  --Continue electrolyte replacement  **Diet/Activity: --Continue PT/OT/Nutritional support  **PPx: --SQ enoxaparin prophylaxis --Decubitus ppx, will likely consult Wound Nurse tomorrow for additional input --PPI for stress ulcer prophylaxis  **Code Status:  Modified code  **Disposition:  --Continues require IV electrolyte replacement -- Will keep hospitalized likely over the weekend  LOS: 3 days   Ardath Sax, MD   09/20/2017, 2:25 PM

## 2017-09-20 NOTE — Progress Notes (Signed)
Occupational Therapy Treatment Patient Details Name: Cory Ibarra MRN: [id number] DOB: 05-29-1961 Today's Date: 09/20/2017    History of present illness 56 y.o. 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 Unable to tolerate outpatient chemo. Recent admission in 08/21/17-09/03/17 for septic shock following 1st round of chemo.    OT comments  Pt remains very motivated.  Worked on adjusting socks from bed level, transfer to chair and UE exercises  Follow Up Recommendations  SNF    Equipment Recommendations  3 in 1 bedside commode    Recommendations for Other Services      Precautions / Restrictions Precautions Precautions: Fall Precaution Comments: dizziness with sitting/standing Restrictions Weight Bearing Restrictions: No       Mobility Bed Mobility     Rolling: Min assist Sidelying to sit: Min assist       General bed mobility comments: assst to roll and sit up; cues for sequencing  Transfers   Equipment used: Rolling walker (2 wheeled)   Sit to Stand: Mod assist;+2 physical assistance Stand pivot transfers: Mod assist;+2 safety/equipment       General transfer comment: assist to power up and stabilize.  Not dizzy but felt he needed to sit right away due to weakness    Balance                                           ADL either performed or assessed with clinical judgement   ADL                       Lower Body Dressing: Moderate assistance;Bed level   Toilet Transfer: Moderate assistance;+2 for physical assistance;Stand-pivot (to recliner)             General ADL Comments: pt able to bring leg up from bed level to pull socks up.  Gave him a reacher to help reach for items and adjust bed linen     Vision       Perception     Praxis      Cognition Arousal/Alertness: Awake/alert Behavior During  Therapy: WFL for tasks assessed/performed Overall Cognitive Status: Within Functional Limits for tasks assessed                                          Exercises General Exercises - Upper Extremity Shoulder Flexion: AROM;20 reps;Seated;Both;10 reps Elbow Flexion: Theraband;20 reps;Both (level one, seated)   Shoulder Instructions       General Comments      Pertinent Vitals/ Pain       Pain Assessment: No/denies pain  Home Living                                          Prior Functioning/Environment              Frequency           Progress Toward Goals  OT Goals(current goals can now be found in the care plan section)  Progress towards OT goals: Progressing toward goals     Plan      Co-evaluation  AM-PAC PT "6 Clicks" Daily Activity     Outcome Measure   Help from another person eating meals?: None Help from another person taking care of personal grooming?: A Little Help from another person toileting, which includes using toliet, bedpan, or urinal?: A Lot Help from another person bathing (including washing, rinsing, drying)?: A Lot Help from another person to put on and taking off regular upper body clothing?: A Little Help from another person to put on and taking off regular lower body clothing?: A Lot 6 Click Score: 16    End of Session        Activity Tolerance Patient tolerated treatment well   Patient Left in chair;with call bell/phone within reach;with family/visitor present;with chair alarm set   Nurse Communication          Time: 9191-6606 OT Time Calculation (min): 27 min  Charges: OT General Charges $OT Visit: 1 Visit OT Treatments $Therapeutic Activity: 8-22 mins $Therapeutic Exercise: 8-22 mins  Lesle Chris, OTR/L [phone] 09/20/2017   Brittay Mogle 09/20/2017, 12:48 PM

## 2017-09-20 NOTE — Progress Notes (Signed)
CRITICAL VALUE ALERT  Critical Value: 5.5 calcium  Date & Time Notied:  10/18 0545  Provider Notified: [place]  Orders Received/Actions taken: Awaiting orders

## 2017-09-21 LAB — MAGNESIUM: Magnesium: 1.7 mg/dL (ref 1.7–2.4)

## 2017-09-21 LAB — PHOSPHORUS: Phosphorus: 1.6 mg/dL — ABNORMAL LOW (ref 2.5–4.6)

## 2017-09-21 LAB — CBC WITH DIFFERENTIAL/PLATELET
BASOS ABS: 0 10*3/uL (ref 0.0–0.1)
BASOS PCT: 0 %
EOS PCT: 0 %
Eosinophils Absolute: 0 10*3/uL (ref 0.0–0.7)
HEMATOCRIT: 26 % — AB (ref 39.0–52.0)
HEMOGLOBIN: 8.4 g/dL — AB (ref 13.0–17.0)
LYMPHS PCT: 1 %
Lymphs Abs: 0.3 10*3/uL — ABNORMAL LOW (ref 0.7–4.0)
MCH: 25.3 pg — AB (ref 26.0–34.0)
MCHC: 32.3 g/dL (ref 30.0–36.0)
MCV: 78.3 fL (ref 78.0–100.0)
MONOS PCT: 1 %
Monocytes Absolute: 0.3 10*3/uL (ref 0.1–1.0)
NEUTROS ABS: 30.2 10*3/uL — AB (ref 1.7–7.7)
Neutrophils Relative %: 98 %
Platelets: 252 10*3/uL (ref 150–400)
RBC: 3.32 MIL/uL — ABNORMAL LOW (ref 4.22–5.81)
RDW: 20.3 % — ABNORMAL HIGH (ref 11.5–15.5)
WBC: 30.8 10*3/uL — ABNORMAL HIGH (ref 4.0–10.5)

## 2017-09-21 LAB — URIC ACID: Uric Acid, Serum: 1.9 mg/dL — ABNORMAL LOW (ref 4.4–7.6)

## 2017-09-21 MED ORDER — POTASSIUM PHOSPHATES 15 MMOLE/5ML IV SOLN
30.0000 mmol | Freq: Once | INTRAVENOUS | Status: AC
  Administered 2017-09-21: 30 mmol via INTRAVENOUS
  Filled 2017-09-21: qty 10

## 2017-09-21 NOTE — Progress Notes (Signed)
Physical Therapy Treatment Patient Details Name: Cory Ibarra MRN: [id number] DOB: 01-11-1961 Today's Date: 09/21/2017    History of Present Illness 56 y.o. 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 Unable to tolerate outpatient chemo. Recent admission in 08/21/17-09/03/17 for septic shock following 1st round of chemo.     PT Comments    Improved activity tolerance today, pt was able to ambulate 5' with RW, distance limited by dizziness. Performed seated BUE/LE exercises. He had dizziness initially upon sitting which resolved after ~2 minutes. Improved tolerance of standing today.    Follow Up Recommendations  SNF;Supervision/Assistance - 24 hour     Equipment Recommendations  None recommended by PT    Recommendations for Other Services       Precautions / Restrictions Precautions Precautions: Fall Precaution Comments: dizziness with sitting/standing Restrictions Weight Bearing Restrictions: No    Mobility  Bed Mobility   Bed Mobility: Rolling;Sidelying to Sit Rolling: Min assist Sidelying to sit: Min assist       General bed mobility comments: assst to roll and sit up; cues for sequencing; pt dizzy initially in sitting, resolved after 2 minutes  Transfers Overall transfer level: Needs assistance Equipment used: Rolling walker (2 wheeled) Transfers: Sit to/from Stand Sit to Stand: From elevated surface;Min assist         General transfer comment: VCs for hand placement, min A to rise/steady, no dizziness initially with standing  Ambulation/Gait Ambulation/Gait assistance: Min guard Ambulation Distance (Feet): 5 Feet Assistive device: Rolling walker (2 wheeled) Gait Pattern/deviations: Step-through pattern;Decreased stride length;Narrow base of support Gait velocity: decreased Gait velocity interpretation: Below normal speed for  age/gender General Gait Details: distance limited due to onset of dizziness   Stairs            Wheelchair Mobility    Modified Rankin (Stroke Patients Only)       Balance Overall balance assessment: Needs assistance   Sitting balance-Leahy Scale: Good     Standing balance support: Bilateral upper extremity supported Standing balance-Leahy Scale: Poor Standing balance comment: relies on BUE support                            Cognition Arousal/Alertness: Awake/alert Behavior During Therapy: WFL for tasks assessed/performed Overall Cognitive Status: Within Functional Limits for tasks assessed                                        Exercises General Exercises - Upper Extremity Shoulder Flexion: AROM;Seated;Both;10 reps General Exercises - Lower Extremity Ankle Circles/Pumps: AROM;Both;15 reps;Seated Long Arc Quad: AROM;Both;10 reps;Seated Hip Flexion/Marching: AROM;Both;Seated;5 reps    General Comments        Pertinent Vitals/Pain Pain Assessment: 0-10 Pain Score: 4  Pain Location: back Pain Descriptors / Indicators: Sore Pain Intervention(s): Limited activity within patient's tolerance;Monitored during session    Home Living                      Prior Function            PT Goals (current goals can now be found in the care plan section) Acute Rehab PT Goals Patient Stated Goal: to walk, go home, shop at antique stores PT Goal Formulation: With patient Time For Goal Achievement: 10/03/17 Potential to Achieve  Goals: Fair Progress towards PT goals: Progressing toward goals    Frequency    Min 3X/week      PT Plan Current plan remains appropriate    Co-evaluation              AM-PAC PT "6 Clicks" Daily Activity  Outcome Measure  Difficulty turning over in bed (including adjusting bedclothes, sheets and blankets)?: Unable Difficulty moving from lying on back to sitting on the side of the bed? :  Unable Difficulty sitting down on and standing up from a chair with arms (e.g., wheelchair, bedside commode, etc,.)?: Unable Help needed moving to and from a bed to chair (including a wheelchair)?: A Little Help needed walking in hospital room?: A Lot Help needed climbing 3-5 steps with a railing? : Total 6 Click Score: 9    End of Session Equipment Utilized During Treatment: Gait belt Activity Tolerance: Patient limited by fatigue;Patient tolerated treatment well Patient left: in chair;with call bell/phone within reach;with chair alarm set Nurse Communication: Mobility status PT Visit Diagnosis: Muscle weakness (generalized) (M62.81);Difficulty in walking, not elsewhere classified (R26.2)     Time: 9892-1194 PT Time Calculation (min) (ACUTE ONLY): 23 min  Charges:  $Therapeutic Exercise: 8-22 mins $Therapeutic Activity: 8-22 mins                    G Codes:          Cory Ibarra 09/21/2017, 11:22 AM [phone]

## 2017-09-21 NOTE — Progress Notes (Signed)
IP PROGRESS NOTE  Subjective:  Continues to feel well. Continues to have much improved appetite and working better with physical therapy.  Objective: Vital signs in last 24 hours: Blood pressure (!) 96/59, pulse (!) 103, temperature 98 F (36.7 C), temperature source Oral, resp. rate 14, height 6\' 2"  (1.88 m), weight 145 lb (65.8 kg), SpO2 98 %.  Intake/Output from previous day: 10/18 0701 - 10/19 0700 In: 6306 [P.O.:1070; I.V.:4816; IV Piggyback:420] Out: 2950 [Urine:2950]  Physical Exam:  Patient appears to be slightly stronger than yesterday. Awake, alert, oriented in no acute distress. HEENT: PERRLA, EOMI, moist mucous membranes Lungs: clear to auscultation bilaterally, no expiratory wheezing Cardiac: improved heart rate, no murmurs. Abdomen: soft, nontender and nondistended. Extremities: no peripheral edema   Portacath/PICC-without erythema. Right-sided percutaneous nephrostomy in place with clear urine. Foley catheter is in place draining clear urine.  Lab Results:  Recent Labs  09/20/17 0500 09/21/17 0549  WBC 4.3 30.8*  HGB 8.3* 8.4*  HCT 25.4* 26.0*  PLT 231 252    BMET  Recent Labs  09/19/17 0308 09/20/17 0500  NA 138 140  K 3.8 2.8*  CL 104 108  CO2 24 24  GLUCOSE 140* 347*  BUN 5* <5*  CREATININE 0.31* 0.32*  CALCIUM 5.8* 5.5*    Lab Results  Component Value Date   CEA1 0.7 06/28/2017    Studies/Results: No results found.  Medications: I have reviewed the patient's current medications.  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 At the present time, patient has recovered  sufficiently to considering initiation of therapy. He remains insistent on continuing to pursue systemic therapy for his malignancy. At this time, he's not a candidate for full dose R-CHOP, but mini-R-CHOP can be considered Having been shown to be reasonably effective and significantly less toxic when used in elderly patients. Change in the therapy has been discussed with the patient extensively. He understands that his chance of curative outcome have been diminished, but is willing to proceed with the current therapy. In addition to those reducing his chemotherapy, I will add prophylactic methotrexate injection intrathecally for which the patient scheduled tomorrow. Based on severe infectious complications of the first cycle, this treatment will be administered with Neulasta support.  **DLBCL: reasonably advanced this is with management complicated by patient's performance standards as outlined above. Patient has completed IT MTX and systemic chemotherapy without significant complications --Continue neupogen 449mcg QDay x3-4 days to replace Neulasta while the patient is hospitalized  **Volume/Electrolytes: Hypovolemia most likely related to poor oral intake despite the patient's assurance of drinking lots of fluids. He clearly response to parenteral fluid supplementation and will continue that overnight. Additionally, patient continues to have significant decreased calcium level. This is partially attributable to hyperbilirubinemia, but also to the effect of previously administered denosumab. Patient has been receiving D5 normal saline overnight in 100 mL per hour, electrolyte replacement was administered yesterday.  --Continue electrolyte replacement  **Diet/Activity: --Continue PT/OT/Nutritional support  **PPx: --SQ enoxaparin prophylaxis --Decubitus ppx, will likely consult Wound Nurse tomorrow for additional input --PPI for stress ulcer prophylaxis  **Code Status: Modified  code  **Disposition:  --Continues require IV electrolyte replacement -- Will keep hospitalized over the weekend with possible discharge back to his rehabilitation facility on  Monday.     LOS: 4 days   Ardath Sax, MD   09/21/2017, 2:56 PM

## 2017-09-22 DIAGNOSIS — D630 Anemia in neoplastic disease: Secondary | ICD-10-CM

## 2017-09-22 DIAGNOSIS — E876 Hypokalemia: Secondary | ICD-10-CM

## 2017-09-22 DIAGNOSIS — E878 Other disorders of electrolyte and fluid balance, not elsewhere classified: Secondary | ICD-10-CM

## 2017-09-22 DIAGNOSIS — D72829 Elevated white blood cell count, unspecified: Secondary | ICD-10-CM

## 2017-09-22 LAB — CBC WITH DIFFERENTIAL/PLATELET
BASOS ABS: 0 10*3/uL (ref 0.0–0.1)
BASOS PCT: 0 %
EOS ABS: 0 10*3/uL (ref 0.0–0.7)
EOS PCT: 0 %
HCT: 25.7 % — ABNORMAL LOW (ref 39.0–52.0)
Hemoglobin: 8.3 g/dL — ABNORMAL LOW (ref 13.0–17.0)
Lymphocytes Relative: 0 %
Lymphs Abs: 0.2 10*3/uL — ABNORMAL LOW (ref 0.7–4.0)
MCH: 25.4 pg — ABNORMAL LOW (ref 26.0–34.0)
MCHC: 32.3 g/dL (ref 30.0–36.0)
MCV: 78.6 fL (ref 78.0–100.0)
Monocytes Absolute: 0.1 10*3/uL (ref 0.1–1.0)
Monocytes Relative: 0 %
Neutro Abs: 42 10*3/uL — ABNORMAL HIGH (ref 1.7–7.7)
Neutrophils Relative %: 99 %
Platelets: 218 10*3/uL (ref 150–400)
RBC: 3.27 MIL/uL — AB (ref 4.22–5.81)
RDW: 20.5 % — AB (ref 11.5–15.5)
WBC: 42.3 10*3/uL — AB (ref 4.0–10.5)

## 2017-09-22 LAB — URIC ACID: URIC ACID, SERUM: 1.6 mg/dL — AB (ref 4.4–7.6)

## 2017-09-22 LAB — MAGNESIUM: Magnesium: 1.7 mg/dL (ref 1.7–2.4)

## 2017-09-22 LAB — PHOSPHORUS: PHOSPHORUS: 2.3 mg/dL — AB (ref 2.5–4.6)

## 2017-09-22 MED ORDER — POTASSIUM CHLORIDE CRYS ER 20 MEQ PO TBCR
40.0000 meq | EXTENDED_RELEASE_TABLET | Freq: Two times a day (BID) | ORAL | Status: DC
  Administered 2017-09-23 (×2): 40 meq via ORAL
  Filled 2017-09-22 (×2): qty 2

## 2017-09-22 NOTE — Progress Notes (Signed)
[facility]   HEMATOLOGY/ONCOLOGY INPATIENT PROGRESS NOTE  Date of Service: 09/22/2017  Inpatient Attending: .Ardath Sax, MD   SUBJECTIVE  Cory Ibarra was seen in the hospital today. Notes that he is feeling better and has improved appetite. No acute new symptoms. Several family members at bedside.   OBJECTIVE:  NAD  PHYSICAL EXAMINATION: . Vitals:   09/21/17 2015 09/22/17 0532 09/22/17 1407 09/22/17 2034  BP: (!) 90/51 106/62 99/63 (!) 108/59  Pulse: 85 95 (!) 104 84  Resp: 14 16 16 16   Temp: 98 F (36.7 C) 97.7 F (36.5 C) 98.3 F (36.8 C) 97.9 F (36.6 C)  TempSrc: Oral Oral Oral Oral  SpO2: 100% 99% 98% 100%  Weight:      Height:       Filed Weights   09/19/17 1600 09/20/17 2131  Weight: 145 lb 8.1 oz (66 kg) 145 lb (65.8 kg)   .Body mass index is 18.62 kg/m.  GENERAL:alert, in no acute distress and comfortable NECK: supple, no JVD, thyroid normal size, non-tender, without nodularity LYMPH:  no palpable lymphadenopathy in the cervical, axillary or inguinal LUNGS: clear to auscultation with normal respiratory effort HEART: regular rate & rhythm,  no murmurs and no lower extremity edema ABDOMEN: abdomen soft, non-tender, normoactive bowel sounds  Musculoskeletal: no cyanosis of digits and no clubbing  PSYCH: alert & oriented x 3 with fluent speech NEURO: no focal motor/sensory deficits  MEDICAL HISTORY:  Past Medical History:  Diagnosis Date  . Cancer ([place])   . Tendonitis     SURGICAL HISTORY: Past Surgical History:  Procedure Laterality Date  . CYSTOSCOPY W/ URETERAL STENT PLACEMENT Right 08/21/2017   Procedure: CYSTOSCOPY WITH RETROGRADE PYELOGRAM, RIGHT;  Surgeon: Alexis Frock, MD;  Location: [facility];  Service: Urology;  Laterality: Right;  . HERNIA REPAIR  1988   Dr. Vida Rigger  . IR FLUORO GUIDE PORT INSERTION RIGHT  07/26/2017  . IR NEPHROSTOMY PLACEMENT RIGHT  08/21/2017  . IR US GUIDE VASC ACCESS RIGHT  07/26/2017    SOCIAL HISTORY: Social  History   Social History  . Marital status: Single    Spouse name: N/A  . Number of children: 0  . Years of education: N/A   Occupational History  . Not on file.   Social History Main Topics  . Smoking status: Never Smoker  . Smokeless tobacco: Never Used  . Alcohol use Yes     Comment: occasionally/socially  . Drug use: No  . Sexual activity: No   Other Topics Concern  . 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 Never used smokeless tobacco.   No e-cigarettes   Occasional alcohol consumption socially.   Single, no children.    FAMILY HISTORY: Family History  Problem Relation Age of Onset  . Cancer Father     ALLERGIES:  is allergic to bee venom.  MEDICATIONS:  Scheduled Meds: . chlorhexidine  15 mL Mouth Rinse BID  . dronabinol  5 mg Oral QAC lunch  . enoxaparin (LOVENOX) injection  40 mg Subcutaneous Q24H  . feeding supplement (ENSURE ENLIVE)  237 mL Oral BID BM  . multivitamin with minerals  1 tablet Oral Daily  . OLANZapine  2.5 mg Oral QHS  . oxyCODONE  10 mg Oral Q12H  . pantoprazole  40 mg Oral Q1200  . potassium chloride  20 mEq Oral Daily   Continuous Infusions: . sodium chloride    .  sodium chloride    . calcium gluconate 1 g (09/22/17 2139)  . dextrose 5 % and 0.9% NaCl 100 mL/hr at 09/22/17 2138  . famotidine    . magnesium sulfate 1 - 4 g bolus IVPB 0 g (09/22/17 0937)   PRN Meds:.sodium chloride, albuterol, diphenhydrAMINE, diphenhydrAMINE, EPINEPHrine, EPINEPHrine, EPINEPHrine, EPINEPHrine, famotidine, heparin lock flush, heparin lock flush, LORazepam, methylPREDNISolone sodium succinate, ondansetron, oxyCODONE, polyethylene glycol, prochlorperazine, sodium chloride flush, sodium chloride flush, sodium chloride flush  REVIEW OF SYSTEMS:    10 Point review of Systems was done is negative except as noted above.   LABORATORY DATA:  I have reviewed the data as  listed  . CBC Latest Ref Rng & Units 09/22/2017 09/21/2017 09/20/2017  WBC 4.0 - 10.5 K/uL 42.3(H) 30.8(H) 4.3  Hemoglobin 13.0 - 17.0 g/dL 8.3(L) 8.4(L) 8.3(L)  Hematocrit 39.0 - 52.0 % 25.7(L) 26.0(L) 25.4(L)  Platelets 150 - 400 K/uL 218 252 231    . CMP Latest Ref Rng & Units 09/20/2017 09/19/2017 09/18/2017  Glucose 65 - 99 mg/dL 347(H) 140(H) 103(H)  BUN 6 - 20 mg/dL <5(L) 5(L) 10  Creatinine 0.61 - 1.24 mg/dL 0.32(L) 0.31(L) 0.38(L)  Sodium 135 - 145 mmol/L 140 138 136  Potassium 3.5 - 5.1 mmol/L 2.8(L) 3.8 2.6(LL)  Chloride 101 - 111 mmol/L 108 104 99(L)  CO2 22 - 32 mmol/L 24 24 24   Calcium 8.9 - 10.3 mg/dL 5.5(LL) 5.8(LL) 5.9(LL)  Total Protein 6.5 - 8.1 g/dL 4.6(L) 5.2(L) 5.2(L)  Total Bilirubin 0.3 - 1.2 mg/dL 0.7 1.0 1.1  Alkaline Phos 38 - 126 U/L 129(H) 131(H) 123  AST 15 - 41 U/L 27 21 17   ALT 17 - 63 U/L 12(L) 10(L) 9(L)     RADIOGRAPHIC STUDIES: I have personally reviewed the radiological images as listed and agreed with the findings in the report. Dg Fluoro Guided Loc Of Needle/cath Tip For Spinal Inject Lt  Result Date: 09/18/2017 CLINICAL DATA:  B-cell lymphoma.  For intrathecal chemotherapy EXAM: DIAGNOSTIC LUMBAR PUNCTURE UNDER FLUOROSCOPIC GUIDANCE FLUOROSCOPY TIME:  Fluoroscopy Time:  0 minutes 16 second Radiation Exposure Index (if provided by the fluoroscopic device): Number of Acquired Spot Images: 0 PROCEDURE: Informed consent was obtained from the patient prior to the procedure, including potential complications of headache, allergy, and pain. With the patient prone, the lower back was prepped with Betadine. 1% Lidocaine was used for local anesthesia. Lumbar puncture was performed at the L4-5 level using a 20 gauge needle with return of slightly blood-tinged CSF which cleared, with an opening pressure of 9 cm water. 13.5 ml of CSF were obtained for laboratory studies. 12 mg of methotrexate was injected into the subarachnoid space. The patient tolerated  the procedure well and there were no apparent complications. IMPRESSION: Successful lumbar puncture and injection of intrathecal methotrexate Electronically Signed   By: Franchot Gallo M.D.   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  I spent 25 minutes counseling the patient face to face. The total time spent in the appointment was 35 minutes and more than 50% was on counseling and direct patient cares.    Sullivan Lone MD [place] AAHIVMS [facility] Hematology/Oncology Physician [facility]  (Office):       [phone] (Work cell):  [phone] (Fax):           [phone]

## 2017-09-22 NOTE — Progress Notes (Signed)
Verified with Dr. Irene Limbo ok to give Granix dose this evening.

## 2017-09-23 DIAGNOSIS — R7989 Other specified abnormal findings of blood chemistry: Secondary | ICD-10-CM

## 2017-09-23 DIAGNOSIS — R945 Abnormal results of liver function studies: Secondary | ICD-10-CM

## 2017-09-23 LAB — CBC WITH DIFFERENTIAL/PLATELET
BASOS PCT: 0 %
Band Neutrophils: 24 %
Basophils Absolute: 0 10*3/uL (ref 0.0–0.1)
Blasts: 0 %
EOS ABS: 0 10*3/uL (ref 0.0–0.7)
EOS PCT: 0 %
HCT: 28.2 % — ABNORMAL LOW (ref 39.0–52.0)
Hemoglobin: 9 g/dL — ABNORMAL LOW (ref 13.0–17.0)
LYMPHS PCT: 0 %
Lymphs Abs: 0 10*3/uL — ABNORMAL LOW (ref 0.7–4.0)
MCH: 25.3 pg — AB (ref 26.0–34.0)
MCHC: 31.9 g/dL (ref 30.0–36.0)
MCV: 79.2 fL (ref 78.0–100.0)
MONO ABS: 0.4 10*3/uL (ref 0.1–1.0)
MYELOCYTES: 0 %
Metamyelocytes Relative: 0 %
Monocytes Relative: 1 %
NEUTROS ABS: 44.2 10*3/uL — AB (ref 1.7–7.7)
NEUTROS PCT: 75 %
NRBC: 0 /100{WBCs}
PLATELETS: 177 10*3/uL (ref 150–400)
Promyelocytes Absolute: 0 %
RBC: 3.56 MIL/uL — ABNORMAL LOW (ref 4.22–5.81)
RDW: 20.1 % — ABNORMAL HIGH (ref 11.5–15.5)
WBC: 44.6 10*3/uL — ABNORMAL HIGH (ref 4.0–10.5)

## 2017-09-23 LAB — COMPREHENSIVE METABOLIC PANEL
ALK PHOS: 195 U/L — AB (ref 38–126)
ALT: 76 U/L — ABNORMAL HIGH (ref 17–63)
ANION GAP: 8 (ref 5–15)
AST: 103 U/L — ABNORMAL HIGH (ref 15–41)
Albumin: 2.2 g/dL — ABNORMAL LOW (ref 3.5–5.0)
BILIRUBIN TOTAL: 1.1 mg/dL (ref 0.3–1.2)
BUN: <5 mg/dL — ABNORMAL LOW (ref 6–20)
CALCIUM: 6.5 mg/dL — AB (ref 8.9–10.3)
CO2: 24 mmol/L (ref 22–32)
Chloride: 105 mmol/L (ref 101–111)
Creatinine, Ser: 0.3 mg/dL — ABNORMAL LOW (ref 0.61–1.24)
GFR calc non Af Amer: >60 mL/min (ref >60)
GLUCOSE: 87 mg/dL (ref 65–99)
Potassium: 4.3 mmol/L (ref 3.5–5.1)
Sodium: 137 mmol/L (ref 135–145)
TOTAL PROTEIN: 4.9 g/dL — AB (ref 6.5–8.1)

## 2017-09-23 LAB — URIC ACID: Uric Acid, Serum: 1.6 mg/dL — ABNORMAL LOW (ref 4.4–7.6)

## 2017-09-23 LAB — PHOSPHORUS: Phosphorus: 2.1 mg/dL — ABNORMAL LOW (ref 2.5–4.6)

## 2017-09-23 LAB — MAGNESIUM: MAGNESIUM: 2 mg/dL (ref 1.7–2.4)

## 2017-09-23 MED ORDER — POTASSIUM CHLORIDE CRYS ER 20 MEQ PO TBCR
40.0000 meq | EXTENDED_RELEASE_TABLET | Freq: Every day | ORAL | Status: DC
  Administered 2017-09-24: 40 meq via ORAL
  Filled 2017-09-23: qty 2

## 2017-09-23 MED ORDER — CALCIUM CARBONATE-VITAMIN D 500-200 MG-UNIT PO TABS
1.0000 | ORAL_TABLET | Freq: Two times a day (BID) | ORAL | Status: DC
  Administered 2017-09-23 – 2017-09-25 (×5): 1 via ORAL
  Filled 2017-09-23 (×5): qty 1

## 2017-09-23 MED ORDER — POTASSIUM & SODIUM PHOSPHATES 280-160-250 MG PO PACK
1.0000 | PACK | Freq: Every day | ORAL | Status: DC
  Administered 2017-09-23 – 2017-09-24 (×2): 1 via ORAL
  Filled 2017-09-23 (×3): qty 1

## 2017-09-23 MED ORDER — MAGNESIUM OXIDE 400 (241.3 MG) MG PO TABS
400.0000 mg | ORAL_TABLET | Freq: Every day | ORAL | Status: DC
  Administered 2017-09-23 – 2017-09-24 (×2): 400 mg via ORAL
  Filled 2017-09-23 (×2): qty 1

## 2017-09-23 NOTE — Progress Notes (Addendum)
HEMATOLOGY/ONCOLOGY INPATIENT PROGRESS NOTE  Date of Service: 09/23/2017  Inpatient Attending: .Ardath Sax, MD  SUBJECTIVE  Cory Ibarra was seen in the hospital today. No acute new symptoms. Improved activity tolerance with PT yesterday - though limited by dizziness. PT continues to recommend 24h supervision and SNF level cares. Potassium levels improved frm 2.8 to 4.3 normal Magnesium today. Phosphorus somewhat low at 2.1. Switching electrolyte replacement to PO today to watch for stability. No other acute new concerns. Completed planned granix -- some leucocytosis related to this.  OBJECTIVE:  NAD  PHYSICAL EXAMINATION: . Vitals:   09/22/17 0532 09/22/17 1407 09/22/17 2034 09/23/17 0642  BP: 106/62 99/63 (!) 108/59 100/66  Pulse: 95 (!) 104 84 (!) 108  Resp: 16 16 16 16   Temp: 97.7 F (36.5 C) 98.3 F (36.8 C) 97.9 F (36.6 C) 98.1 F (36.7 C)  TempSrc: Oral Oral Oral Oral  SpO2: 99% 98% 100% 100%  Weight:    145 lb 11.6 oz (66.1 kg)  Height:       Filed Weights   09/19/17 1600 09/20/17 2131 09/23/17 0642  Weight: 145 lb 8.1 oz (66 kg) 145 lb (65.8 kg) 145 lb 11.6 oz (66.1 kg)   .Body mass index is 18.71 kg/m.  GENERAL:alert, in no acute distress and comfortable NECK: supple, no JVD, thyroid normal size, non-tender, without nodularity LYMPH:  no palpable lymphadenopathy in the cervical, axillary or inguinal LUNGS: clear to auscultation with normal respiratory effort HEART: regular rate & rhythm,  no murmurs and no lower extremity edema ABDOMEN: abdomen soft, non-tender, normoactive bowel sounds  Musculoskeletal: no cyanosis of digits and no clubbing  PSYCH: alert & oriented x 3 with fluent speech NEURO: no focal motor/sensory deficits  MEDICAL HISTORY:  Past Medical History:  Diagnosis Date  . Cancer ([place])   . Tendonitis     SURGICAL HISTORY: Past Surgical History:  Procedure Laterality Date  . CYSTOSCOPY W/ URETERAL STENT PLACEMENT Right  08/21/2017   Procedure: CYSTOSCOPY WITH RETROGRADE PYELOGRAM, RIGHT;  Surgeon: Alexis Frock, MD;  Location: [facility];  Service: Urology;  Laterality: Right;  . HERNIA REPAIR  1988   Dr. Vida Rigger  . IR FLUORO GUIDE PORT INSERTION RIGHT  07/26/2017  . IR NEPHROSTOMY PLACEMENT RIGHT  08/21/2017  . IR US GUIDE VASC ACCESS RIGHT  07/26/2017    SOCIAL HISTORY: Social History   Social History  . Marital status: Single    Spouse name: N/A  . Number of children: 0  . Years of education: N/A   Occupational History  . Not on file.   Social History Main Topics  . Smoking status: Never Smoker  . Smokeless tobacco: Never Used  . Alcohol use Yes     Comment: occasionally/socially  . Drug use: No  . Sexual activity: No   Other Topics Concern  . 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 Never used smokeless tobacco.   No e-cigarettes   Occasional alcohol consumption socially.   Single, no children.    FAMILY HISTORY: Family History  Problem Relation Age of Onset  . Cancer Father     ALLERGIES:  is allergic to bee venom.  MEDICATIONS:  Scheduled Meds: . chlorhexidine  15 mL Mouth Rinse BID  . dronabinol  5 mg Oral QAC lunch  . enoxaparin (LOVENOX) injection  40 mg Subcutaneous Q24H  . feeding supplement (ENSURE ENLIVE)  237 mL Oral  BID BM  . magnesium oxide  400 mg Oral Daily  . multivitamin with minerals  1 tablet Oral Daily  . OLANZapine  2.5 mg Oral QHS  . oxyCODONE  10 mg Oral Q12H  . pantoprazole  40 mg Oral Q1200  . potassium & sodium phosphates  1 packet Oral Daily  . [START ON 09/24/2017] potassium chloride  40 mEq Oral Daily   Continuous Infusions: . sodium chloride    . sodium chloride    . dextrose 5 % and 0.9% NaCl 100 mL/hr at 09/22/17 2138  . famotidine     PRN Meds:.sodium chloride, albuterol, diphenhydrAMINE, diphenhydrAMINE, EPINEPHrine, EPINEPHrine, EPINEPHrine, EPINEPHrine,  famotidine, heparin lock flush, heparin lock flush, LORazepam, methylPREDNISolone sodium succinate, ondansetron, oxyCODONE, polyethylene glycol, prochlorperazine, sodium chloride flush, sodium chloride flush, sodium chloride flush  REVIEW OF SYSTEMS:    10 Point review of Systems was done is negative except as noted above.   LABORATORY DATA:  I have reviewed the data as listed  . CBC Latest Ref Rng & Units 09/23/2017 09/22/2017 09/21/2017  WBC 4.0 - 10.5 K/uL 44.6(H) 42.3(H) 30.8(H)  Hemoglobin 13.0 - 17.0 g/dL 9.0(L) 8.3(L) 8.4(L)  Hematocrit 39.0 - 52.0 % 28.2(L) 25.7(L) 26.0(L)  Platelets 150 - 400 K/uL 177 218 252    . CMP Latest Ref Rng & Units 09/23/2017 09/20/2017 09/19/2017  Glucose 65 - 99 mg/dL 87 347(H) 140(H)  BUN 6 - 20 mg/dL <5(L) <5(L) 5(L)  Creatinine 0.61 - 1.24 mg/dL 0.30(L) 0.32(L) 0.31(L)  Sodium 135 - 145 mmol/L 137 140 138  Potassium 3.5 - 5.1 mmol/L 4.3 2.8(L) 3.8  Chloride 101 - 111 mmol/L 105 108 104  CO2 22 - 32 mmol/L 24 24 24   Calcium 8.9 - 10.3 mg/dL 6.5(L) 5.5(LL) 5.8(LL)  Total Protein 6.5 - 8.1 g/dL 4.9(L) 4.6(L) 5.2(L)  Total Bilirubin 0.3 - 1.2 mg/dL 1.1 0.7 1.0  Alkaline Phos 38 - 126 U/L 195(H) 129(H) 131(H)  AST 15 - 41 U/L 103(H) 27 21  ALT 17 - 63 U/L 76(H) 12(L) 10(L)     RADIOGRAPHIC STUDIES: I have personally reviewed the radiological images as listed and agreed with the findings in the report. Dg Fluoro Guided Loc Of Needle/cath Tip For Spinal Inject Lt  Result Date: 09/18/2017 CLINICAL DATA:  B-cell lymphoma.  For intrathecal chemotherapy EXAM: DIAGNOSTIC LUMBAR PUNCTURE UNDER FLUOROSCOPIC GUIDANCE FLUOROSCOPY TIME:  Fluoroscopy Time:  0 minutes 16 second Radiation Exposure Index (if provided by the fluoroscopic device): Number of Acquired Spot Images: 0 PROCEDURE: Informed consent was obtained from the patient prior to the procedure, including potential complications of headache, allergy, and pain. With the patient prone, the lower  back was prepped with Betadine. 1% Lidocaine was used for local anesthesia. Lumbar puncture was performed at the L4-5 level using a 20 gauge needle with return of slightly blood-tinged CSF which cleared, with an opening pressure of 9 cm water. 13.5 ml of CSF were obtained for laboratory studies. 12 mg of methotrexate was injected into the subarachnoid space. The patient tolerated the procedure well and there were no apparent complications. IMPRESSION: Successful lumbar puncture and injection of intrathecal methotrexate Electronically Signed   By: Franchot Gallo M.D.   On: 09/18/2017 13:24    ASSESSMENT & PLAN:   1) Diffuse large B-cell lymphoma   s/p dose reduced C2 of R-CHOP (mini-CHOP on 10/16) -Mx per Dr [name]  2) Leucocytosis - due to G-CSF -completed neupogen 445mcg QDay x3days to replace Neulasta while the patient is hospitalized -  will hold further Granix at this time.  3) Electrolyte abnormalities -hypokalemia/hypophosphatemia/hypocalcemia --Hypokalkemia K up from 2.8 to 4.3 -- on only po replacement Phosphorus 2.1 -- on PO replacement. Hypocalcemia - improving. Started on PO calcium/vit D -ordered 25oh Vit D level for AM -- considering hypocalcemia and hypophosphatemia - would replace Vit D levels to 60-90. Vit D levels have also been shown to have a bearing on Rituxan related ADCC. 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 no overt symptoms Plan -transfuse prn for hgb<8 or if symptomatic  5) ABnormal Liver function tests - ? Chemotherapy related. No RUQ TTP -rpt labs in AM -if worsening would need [place] Abd  Diet/Activity: --Continue PT/OT/Nutritional support  PPx: --SQ enoxaparin prophylaxis --PPI for stress ulcer prophylaxis  Dispo - likely discharge back to rehab early this week if labs/electrolytes/LFTs stable. Dr [name] will return to take over patients care from Monday.  I spent 25 minutes counseling the patient face to face. The  total time spent in the appointment was 35 minutes and more than 50% was on counseling and direct patient cares.    Sullivan Lone MD [place] AAHIVMS [facility] Hematology/Oncology Physician [facility]  (Office):       [phone] (Work cell):  [phone] (Fax):           [phone]

## 2017-09-23 NOTE — Assessment & Plan Note (Signed)
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 Initial presentation with severe left hip and knee due to extensive disease involving the skeletal structures of the spine with multilevel compression fractures. Some spinal canal intrusion without obvious cord compression was noted. Additionally, bulky retroperitoneal lymphadenopathy and a soft tissue mass in the thoracic wall were detected. Based on presence of extensive skeletal disease and soft tissue mass in the chest wall, patient is Stage IVB.  Patient was initially treated with steroids and palliative radiation to the spine which resulted in significant symptomatic improvement. Patient has recovered from his radiation therapy. Pretreatment workup included echocardiogram obtained on 07/19/17 demonstrating left ventricular ejection fraction of 50-55%, patient underwent lab work testing which returned back positive for core antibodies for hepatitis B virus. Additional testing was negative for IgM antibodies and negative for hepatitis B DNA. These findings are consistent with either previous infection for which the patient has completely recovered, or previous history of immunization patient does not recall having either.   Following initiation of systemic chemotherapy, patient was admitted to the hospital with septic shock due to urinary tract infection in the setting off obstruction of the renal drainage by kidney stone. Patient responded to stress dose steroids, pressor support, broad-spectrum antibiotics, and volume resuscitation. Has been able to maintain his own pressures after several days and demonstrated resumed adrenal function. We have retested here for hepatitis B and did not detect any signs of infection reactivation.  Nevertheless, patient demonstrated rapid and profound loss of appetite resulting in severe calorie/protein malnutrition, dramatic decrease  in his activity tolerance going from ECOG 2 down to ECOG 4. Renegotiation of chemotherapy was delayed to allow for improvement in the physical state. Patient has improved somewhat since last visit to the clinic. On presentation, he was significantly hypotensive and tachycardic, received IV fluids and improved significantly including feeling stronger and being more coherent in his answers. Unfortunately, we were unable to give his chemotherapy as an outpatient and he will be admitted to the hospital for additional assessment and therapy.  Plan: --Admit to Inpatient: will proceed with inpatient ministration of mini-RCHOP -- does reduction of baseline chemotherapy is clearly indicated based on performance that has deterioration. Patient will also receive intrathecal methotrexate for CNS recurrence prophylaxis. At this time, due to persistent hypercalcemia, we will commit any further than the last map until patients perform this task is improved significantly. -- Will likely have him return my clinic in one week. Patient will need to obtain restaging with PET/CT prior to the next psychotherapy.

## 2017-09-23 NOTE — Progress Notes (Signed)
[facility] Follow-up Visit:  Assessment: Diffuse large B cell lymphoma ([place]) 56 year old male with diagnosis of diffuse large B-cell lymphoma, GCB subtype involving multifocal skeletal sites and multiple node regions. Initial presentation with severe left hip and knee due to extensive disease involving the skeletal structures of the spine with multilevel compression fractures. Some spinal canal intrusion without obvious cord compression was noted. Additionally, bulky retroperitoneal lymphadenopathy and a soft tissue mass in the thoracic wall were detected. Based on presence of extensive skeletal disease and soft tissue mass in the chest wall, patient is Stage IVB.  Patient was initially treated with steroids and palliative radiation to the spine which resulted in significant symptomatic improvement. Patient has recovered from his radiation therapy. Pretreatment workup included echocardiogram obtained on 07/19/17 demonstrating left ventricular ejection fraction of 50-55%, patient underwent lab work testing which returned back positive for core antibodies for hepatitis B virus. Additional testing was negative for IgM antibodies and negative for hepatitis B DNA. These findings are consistent with either previous infection for which the patient has completely recovered, or previous history of immunization patient does not recall having either.   Following initiation of systemic chemotherapy, patient was admitted to the hospital with septic shock due to urinary tract infection in the setting off obstruction of the renal drainage by kidney stone. Patient responded to stress dose steroids, pressor support, broad-spectrum antibiotics, and volume resuscitation. Has been able to maintain his own pressures after several days and demonstrated resumed adrenal function. We have retested here for hepatitis B and did not detect any signs of infection reactivation.  Nevertheless, patient demonstrated  rapid and profound loss of appetite resulting in severe calorie/protein malnutrition, dramatic decrease in his activity tolerance going from ECOG 2 down to ECOG 4. Renegotiation of chemotherapy was delayed to allow for improvement in the physical state. Patient has improved somewhat since last visit to the clinic. On presentation, he was significantly hypotensive and tachycardic, received IV fluids and improved significantly including feeling stronger and being more coherent in his answers. Unfortunately, we were unable to give his chemotherapy as an outpatient and he will be admitted to the hospital for additional assessment and therapy.  Plan: --Admit to Inpatient: will proceed with inpatient ministration of mini-RCHOP -- does reduction of baseline chemotherapy is clearly indicated based on performance that has deterioration. Patient will also receive intrathecal methotrexate for CNS recurrence prophylaxis. At this time, due to persistent hypercalcemia, we will commit any further than the last map until patients perform this task is improved significantly. -- Will likely have him return my clinic in one week. Patient will need to obtain restaging with PET/CT prior to the next psychotherapy.   Voice recognition software was used and creation of this note. Despite my best effort at editing the text, some misspelling/errors may have occurred.  Orders Placed This Encounter  Procedures  . NM PET Image Restag (PS) Skull Base To Thigh    Standing Status:   Future    Standing Expiration Date:   09/17/2018    Order Specific Question:   If indicated for the ordered procedure, I authorize the administration of a radiopharmaceutical per Radiology protocol    Answer:   Yes    Order Specific Question:   Preferred imaging location?    Answer:   [facility]    Order Specific Question:   Radiology Contrast Protocol - do NOT remove file path    Answer:   [email or web address]     Order  Specific Question:   Reason for Exam additional comments    Answer:   DLBCL, please eval therapy response  . CBC with Differential    Standing Status:   Future    Standing Expiration Date:   09/17/2018  . Comprehensive metabolic panel    Standing Status:   Future    Standing Expiration Date:   09/17/2018  . Magnesium    Standing Status:   Future    Standing Expiration Date:   09/17/2018  . EKG 12-Lead    Standing Status:   Standing    Number of Occurrences:   1    Order Specific Question:   Reason for Exam    Answer:   tachycardia  . EKG 12-Lead    Cancer Staging Diffuse large B cell lymphoma ([place]) Staging form: Hodgkin and Non-Hodgkin Lymphoma, AJCC 8th Edition - Clinical stage from 07/16/2017: Stage IV (Diffuse large B-cell lymphoma) - Signed by Ardath Sax, MD on 07/18/2017   All questions were answered.  . The patient knows to call the clinic with any problems, questions or concerns.  This note was electronically signed.    History of Presenting Illness Dayyan Krist is a 56 y.o. Caucasian male followed in the [place] for diagnosis of diffuse large B-cell lymphoma. Patient initially presented on 06/28/17 with progressive pain in the left hip and knee at least 6 week duration. This was accompanied by decreased appetite, decreased activity due to the pain. Patient was not closely followed by physician for an extended period of time due to lack of insurance.  Initially presented the week prior and was diagnosed with sciatica and treated with steroids. Steroids resulted in symptomatic improvement, but the pain recurred rapidly after completion of a taper. Approximately at the time of onset of the pain in the left lower extremity, patient will start noticing mild abdominal discomfort. This has been associated with loss of appetite for a month and some weight loss. You, dysphasia, early satiety, diarrhea, constipation, melena, or hematochezia. Initial imaging evaluation  revealed multifocal lytic lesions in the skeletal structures as well as pelvic mass. Oncology was consulted and patient was seen by Dr Marjie Skiff. Based on his recommendations, additional imaging was obtained, patient was started on steroids, neurosurgery and radiation oncology were consulted. Patient was started on palliative radiation therapy to the lumbar spine which resulted in significant symptomatic improvement.  His last visit to the clinic, patient made an effort to work hard with physical therapy appears to be stronger. Currently, being able to sit up in the wheelchair for the clinic visit. Continues to have skeletal pain, but is handling it better today. Appetite remains a problem.   Oncological/hematological History:   Diffuse large B cell lymphoma ([place])   06/28/2017 Imaging    CT Renal protocol: Bilateral nephrolithiasis. 6.7 cm soft tissue mass in the left. Aortic location, additional 3.6 cm left. Aortic soft tissue mass. Mild prostatic enlargement. 5.6 cm soft tissue mass between the anterior right sixth and seventh ribs. Patchy lucencies in the T10, soft tissue mass in the right posterior elements of T12 measuring 3.9 cm with associated osseous destruction. There is mass extension into the spinal canal. Small lucent lesion in the posterior L1. Patchy lucency throughout L3 with partial collapse of the vertebral body. Soft tissue mass extending into the canal. Bilateral pubic symphysis lesions.       06/28/2017 Imaging    MRI T/L Spine: Multiple osseous lesions involving T2, T6, T7 with the near complete involvement  of the vertebral body, T8, T10 with almost complete replacement, T12. Thoracic spinal cord with normal signal. Large L3 vertebral body lesion with burst fracture with 50% height loss, small lesion in L4, left prevertebral mass at the L1 level measuring 3.9 cm, large periaortic mass at the level of left renal vessels measuring 9.4 cm.       06/29/2017 Initial Diagnosis     Diffuse large B cell lymphoma ([place])      06/29/2017 Pathology Results    Core Bx Rt chest wall: Diffuse large B-cell lymphoma, germinal center subtype; IHC -- positive for CD10, CD20, BCL-6, BCL-2, Ki-67 86%      07/02/2017 - 07/13/2017 Radiation Therapy    Site/dose:   Lumbar spine // 25 Gy in 10 Fx      07/19/2017 Echocardiogram    LVEF 50-55%      07/30/2017 Imaging    CT C/A/P: Interval improvement in some, but worsening of other skeletal lesions. No enlarged mediastinal lymph nodes. Normal appearing esophagus and thyroid gland.  No lung nodules or pleural fluid. Left anterior chest wall mass arising from the anterior aspect of the left third rib, 4.9x3.4cm. Right anterior chest wall mass arising from the anterior aspect of the sixth rib, 5.9x3.0cm, previously 5.6 x 3.9 cm on 06/28/2017. Lytic and expansile lesion in the lateral aspect of the left third rib. Similar changes involving the posterior aspect of the left seventh rib.Expansile lesion involving the posteromedial aspect of the left scapula. Lytic lesion in the right T10 vertebral body and posterior elements demonstrates some interval calcification without significant change in extent and with interval superior endplate compression. Lytic, expansile lesion involving the right vertebral body and posterior elements at the T12 level is smaller without well-visualized tumor in the canal on the right, improved. There is interval mild collapse of the posterior aspect of the vertebral body on the right. Normal appearing liver. Normal spleen in size without focal abnormality. The previously demonstrated 6.7 x 5.1 cm left para-aortic retroperitoneal lymph node mass is much smaller, currently measuring 3.1 x 1.7 cm on image number 81 series 2. A more inferior left para-aortic node previously measuring 3.6 x 3.3 cm currently measures 1.8 x 1.2 cm on image number 89 series 2. No new or enlarging lymph nodes are seen. The previously demonstrated left  L3 vertebral destructive mass is significantly smaller with decreased size of the previously demonstrated component within the spinal canal. There is also mildly progressive collapse and sclerosis of the L3 vertebral body. There has been an increase in size of a similar-appearing mass in anterior aspect of the right iliac bone, currently measuring 4.0 x 3.4 cm. There is also been increase in size of a similar-appearing mass in the right pubic body, currently measuring approximately 3.1 x 2.8 cm, extending into the symphysis pubis.      08/09/2017 -  Chemotherapy    R-CHOP21: DOXOrubicin (ADRIAMYCIN) 50 mg/m2, d1 + vinCRIStine (ONCOVIN) 2 mg, d1 + riTUXimab (RITUXAN) 375 mg/m2, d1 + cyclophosphamide (CYTOXAN) 750 mg/m2, d1 Q21d x6 cycles planned --Cycle #1, 46/65/99:  Complicated by septic shock caused by obstructive urinary tract infection in the setting of neutropenia. Results in severe deconditioning, precipitous drop over the performance status of the patient.  --Cycle #2, 09/17/17: DR to mini-R-CHOP basic tolerance of the previous cycle. Intrathecal prophylactic methotrexate added to the therapy.       Medical History: Past Medical History:  Diagnosis Date  . Cancer ([place])   . Tendonitis  Surgical History: Past Surgical History:  Procedure Laterality Date  . CYSTOSCOPY W/ URETERAL STENT PLACEMENT Right 08/21/2017   Procedure: CYSTOSCOPY WITH RETROGRADE PYELOGRAM, RIGHT;  Surgeon: Alexis Frock, MD;  Location: [facility];  Service: Urology;  Laterality: Right;  . HERNIA REPAIR  1988   Dr. Vida Rigger  . IR FLUORO GUIDE PORT INSERTION RIGHT  07/26/2017  . IR NEPHROSTOMY PLACEMENT RIGHT  08/21/2017  . IR US GUIDE VASC ACCESS RIGHT  07/26/2017    Family History: Family History  Problem Relation Age of Onset  . Cancer Father     Social History: Social History   Social History  . Marital status: Single    Spouse name: N/A  . Number of children: 0  . Years of education: N/A    Occupational History  . Not on file.   Social History Main Topics  . Smoking status: Never Smoker  . Smokeless tobacco: Never Used  . Alcohol use Yes     Comment: occasionally/socially  . Drug use: No  . Sexual activity: No   Other Topics Concern  . 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 Never used smokeless tobacco.   No e-cigarettes   Occasional alcohol consumption socially.   Single, no children.    Allergies: Allergies  Allergen Reactions  . Bee Venom Swelling    Medications:  No current facility-administered medications for this visit.    No current outpatient prescriptions on file.   Facility-Administered Medications Ordered in Other Visits  Medication Dose Route Frequency Provider Last Rate Last Dose  . calcium-vitamin D (OSCAL WITH D) 500-200 MG-UNIT per tablet 1 tablet  1 tablet Oral BID Brunetta Genera, MD   1 tablet at 09/23/17 1331  . chlorhexidine (PERIDEX) 0.12 % solution 15 mL  15 mL Mouth Rinse BID Ardath Sax, MD   15 mL at 09/22/17 2139  . dextrose 5 %-0.9 % sodium chloride infusion   Intravenous Continuous Ardath Sax, MD 100 mL/hr at 09/22/17 2138    . dronabinol (MARINOL) capsule 5 mg  5 mg Oral QAC lunch Ardath Sax, MD   5 mg at 09/23/17 1330  . enoxaparin (LOVENOX) injection 40 mg  40 mg Subcutaneous Q24H Ardath Sax, MD   40 mg at 09/23/17 1004  . feeding supplement (ENSURE ENLIVE) (ENSURE ENLIVE) liquid 237 mL  237 mL Oral BID BM Perlov, Marinell Blight, MD   237 mL at 09/23/17 1000  . LORazepam (ATIVAN) tablet 0.5 mg  0.5 mg Oral Q6H PRN Perlov, Mikhail Darnell Level, MD      . magnesium oxide (MAG-OX) tablet 400 mg  400 mg Oral Daily Brunetta Genera, MD   400 mg at 09/23/17 1331  . multivitamin with minerals tablet 1 tablet  1 tablet Oral Daily Ardath Sax, MD   1 tablet at 09/23/17 1004  . OLANZapine (ZYPREXA) tablet 2.5 mg  2.5 mg Oral QHS  Kashawn, Dirr, RPH   2.5 mg at 09/22/17 2139  . ondansetron (ZOFRAN) tablet 8 mg  8 mg Oral Q8H PRN Perlov, Mikhail Darnell Level, MD      . oxyCODONE (Oxy IR/ROXICODONE) immediate release tablet 10 mg  10 mg Oral Q4H PRN Ardath Sax, MD   10 mg at 09/23/17 1433  . oxyCODONE (OXYCONTIN) 12 hr tablet 10 mg  10 mg Oral Q12H Ardath Sax, MD   10 mg at 09/23/17 1004  .  pantoprazole (PROTONIX) EC tablet 40 mg  40 mg Oral Q1200 Ardath Sax, MD   40 mg at 09/23/17 1331  . polyethylene glycol (MIRALAX / GLYCOLAX) packet 17 g  17 g Oral Daily PRN Ardath Sax, MD   17 g at 09/22/17 1708  . potassium & sodium phosphates (PHOS-NAK) 280-160-250 MG packet 1 packet  1 packet Oral Daily Brunetta Genera, MD   1 packet at 09/23/17 1433  . [START ON 09/24/2017] potassium chloride SA (K-DUR,KLOR-CON) CR tablet 40 mEq  40 mEq Oral Daily Brunetta Genera, MD      . prochlorperazine (COMPAZINE) tablet 10 mg  10 mg Oral Q6H PRN Perlov, Mikhail Darnell Level, MD      . sodium chloride flush (NS) 0.9 % injection 10 mL  10 mL Intracatheter PRN Ardath Sax, MD   10 mL at 08/09/17 1702  . sodium chloride flush (NS) 0.9 % injection 10 mL  10 mL Intracatheter PRN Perlov, Mikhail G, MD      . sodium chloride flush (NS) 0.9 % injection 10-40 mL  10-40 mL Intracatheter PRN Perlov, Mikhail G, MD      . sodium chloride flush (NS) 0.9 % injection 3 mL  3 mL Intravenous PRN Perlov, Marinell Blight, MD        Review of Systems: Review of Systems  Constitutional: Positive for appetite change and fatigue. Negative for chills, diaphoresis, fever and unexpected weight change.  HENT:  Negative.   Eyes: Negative.   Respiratory: Negative.   Cardiovascular: Negative.   Gastrointestinal: Negative.   Endocrine: Negative.   Genitourinary: Negative.    Musculoskeletal: Positive for back pain. Negative for arthralgias, flank pain, myalgias, neck pain and neck stiffness.  Skin: Negative.   Neurological: Negative.   Hematological:  Negative.   Psychiatric/Behavioral: Negative.      PHYSICAL EXAMINATION Blood pressure (!) 84/46, pulse (!) 156, temperature 98.1 F (36.7 C), temperature source Oral, resp. rate 20, height 6' 2"  (1.88 m), weight 136 lb 8 oz (61.9 kg), SpO2 98 %.  ECOG PERFORMANCE STATUS: 3 - Symptomatic, >50% confined to bed  Physical Exam  Constitutional: He is oriented to person, place, and time. No distress.  HENT:  Head: Normocephalic and atraumatic.  Mouth/Throat: Oropharynx is clear and moist. No oropharyngeal exudate.  Eyes: Pupils are equal, round, and reactive to light. Right eye exhibits no discharge. No scleral icterus.  Neck: Normal range of motion. No thyromegaly present.  Cardiovascular: Normal rate and regular rhythm.  Exam reveals no gallop.   No murmur heard. Pulmonary/Chest: Effort normal. No respiratory distress. He has no wheezes. He exhibits no tenderness.  Abdominal: Soft. He exhibits no distension and no mass. There is no tenderness. There is no rebound and no guarding.  Musculoskeletal: He exhibits no edema, tenderness or deformity.  Lymphadenopathy:    He has no cervical adenopathy.  Neurological: He is alert and oriented to person, place, and time. He has normal reflexes.  Skin: Skin is warm and dry. No rash noted. He is not diaphoretic. No erythema. No pallor.     LABORATORY DATA: I have personally reviewed the data as listed: Admission on 09/17/2017  No results displayed because visit has over 200 results.    Appointment on 09/17/2017  Component Date Value Ref Range Status  . WBC 09/17/2017 10.4* 4.0 - 10.3 10e3/uL Final  . NEUT# 09/17/2017 7.9* 1.5 - 6.5 10e3/uL Final  . HGB 09/17/2017 11.6* 13.0 - 17.1 g/dL Final  . HCT  09/17/2017 35.4* 38.4 - 49.9 % Final  . Platelets 09/17/2017 453* 140 - 400 10e3/uL Final  . MCV 09/17/2017 75.0* 79.3 - 98.0 fL Final  . MCH 09/17/2017 24.6* 27.2 - 33.4 pg Final  . MCHC 09/17/2017 32.8  32.0 - 36.0 g/dL Final  . RBC  09/17/2017 4.72  4.20 - 5.82 10e6/uL Final  . RDW 09/17/2017 19.8* 11.0 - 14.6 % Final  . lymph# 09/17/2017 1.3  0.9 - 3.3 10e3/uL Final  . MONO# 09/17/2017 1.1* 0.1 - 0.9 10e3/uL Final  . Eosinophils Absolute 09/17/2017 0.1  0.0 - 0.5 10e3/uL Final  . Basophils Absolute 09/17/2017 0.0  0.0 - 0.1 10e3/uL Final  . NEUT% 09/17/2017 75.8* 39.0 - 75.0 % Final  . LYMPH% 09/17/2017 12.8* 14.0 - 49.0 % Final  . MONO% 09/17/2017 10.5  0.0 - 14.0 % Final  . EOS% 09/17/2017 0.8  0.0 - 7.0 % Final  . BASO% 09/17/2017 0.1  0.0 - 2.0 % Final  . Sodium 09/17/2017 135* 136 - 145 mEq/L Final  . Potassium 09/17/2017 3.5  3.5 - 5.1 mEq/L Final  . Chloride 09/17/2017 98  98 - 109 mEq/L Final  . CO2 09/17/2017 19* 22 - 29 mEq/L Final  . Glucose 09/17/2017 108  70 - 140 mg/dl Final   Glucose reference range is for nonfasting patients. Fasting glucose reference range is 70- 100.  Marland Kitchen BUN 09/17/2017 7.3  7.0 - 26.0 mg/dL Final  . Creatinine 09/17/2017 0.5* 0.7 - 1.3 mg/dL Final  . Total Bilirubin 09/17/2017 1.26* 0.20 - 1.20 mg/dL Final  . Alkaline Phosphatase 09/17/2017 146  40 - 150 U/L Final  . AST 09/17/2017 15  5 - 34 U/L Final  . ALT 09/17/2017 <6  0-55 U/L U/L Final  . Total Protein 09/17/2017 6.3* 6.4 - 8.3 g/dL Final  . Albumin 09/17/2017 2.4* 3.5 - 5.0 g/dL Final  . Calcium 09/17/2017 6.4* 8.4 - 10.4 mg/dL Final  . Anion Gap 09/17/2017 18* 3 - 11 mEq/L Final  . EGFR 09/17/2017 >60  >60 ml/min/1.73 m2 Final   eGFR is calculated using the CKD-EPI Creatinine Equation (2009)  . Uric Acid, Serum 09/17/2017 6.8  2.6 - 7.4 mg/dl Final  . Magnesium 09/17/2017 1.6  1.5 - 2.5 mg/dl Final  . Phosphorus, Ser 09/17/2017 1.9* 2.5 - 4.5 mg/dL Final  . Hep B Core Ab, IgM 09/17/2017 Negative  Negative Final  . HBV DNA SERPL PCR-ACNC 09/17/2017 HBV DNA not detected  IU/mL Final  . HBV DNA SERPL PCR-LOG IU 09/17/2017 Test Not Performed.  log10 IU/mL Final   Comment: Unable to calculate result since non-numeric  result obtained for component test.   . Test Information: 09/17/2017 Comment   Final   The reportable range for this assay is 10 IU/mL to 1 billion IU/mL.       Ardath Sax, MD

## 2017-09-24 DIAGNOSIS — Z5111 Encounter for antineoplastic chemotherapy: Principal | ICD-10-CM

## 2017-09-24 LAB — COMPREHENSIVE METABOLIC PANEL
ALBUMIN: 2.2 g/dL — AB (ref 3.5–5.0)
ALK PHOS: 213 U/L — AB (ref 38–126)
ALT: 79 U/L — AB (ref 17–63)
AST: 75 U/L — ABNORMAL HIGH (ref 15–41)
Anion gap: 5 (ref 5–15)
BILIRUBIN TOTAL: 1.2 mg/dL (ref 0.3–1.2)
CALCIUM: 6.5 mg/dL — AB (ref 8.9–10.3)
CO2: 27 mmol/L (ref 22–32)
Chloride: 107 mmol/L (ref 101–111)
GLUCOSE: 98 mg/dL (ref 65–99)
Potassium: 3.9 mmol/L (ref 3.5–5.1)
SODIUM: 139 mmol/L (ref 135–145)
TOTAL PROTEIN: 4.9 g/dL — AB (ref 6.5–8.1)

## 2017-09-24 LAB — CBC WITH DIFFERENTIAL/PLATELET
Basophils Absolute: 0 10*3/uL (ref 0.0–0.1)
Basophils Relative: 0 %
EOS ABS: 0.1 10*3/uL (ref 0.0–0.7)
EOS PCT: 1 %
HCT: 26.1 % — ABNORMAL LOW (ref 39.0–52.0)
HEMOGLOBIN: 8 g/dL — AB (ref 13.0–17.0)
LYMPHS ABS: 0.2 10*3/uL — AB (ref 0.7–4.0)
LYMPHS PCT: 1 %
MCH: 24 pg — AB (ref 26.0–34.0)
MCHC: 30.7 g/dL (ref 30.0–36.0)
MCV: 78.4 fL (ref 78.0–100.0)
MONOS PCT: 0 %
Monocytes Absolute: 0 10*3/uL — ABNORMAL LOW (ref 0.1–1.0)
NEUTROS PCT: 98 %
Neutro Abs: 13.4 10*3/uL — ABNORMAL HIGH (ref 1.7–7.7)
Platelets: 145 10*3/uL — ABNORMAL LOW (ref 150–400)
RBC: 3.33 MIL/uL — AB (ref 4.22–5.81)
RDW: 19.3 % — ABNORMAL HIGH (ref 11.5–15.5)
WBC: 13.7 10*3/uL — AB (ref 4.0–10.5)

## 2017-09-24 LAB — URIC ACID: Uric Acid, Serum: 1.6 mg/dL — ABNORMAL LOW (ref 4.4–7.6)

## 2017-09-24 LAB — MAGNESIUM: Magnesium: 1.7 mg/dL (ref 1.7–2.4)

## 2017-09-24 LAB — PHOSPHORUS: Phosphorus: 2.3 mg/dL — ABNORMAL LOW (ref 2.5–4.6)

## 2017-09-24 NOTE — Progress Notes (Signed)
IP PROGRESS NOTE  Subjective:  Continues to feel well. Improving appetite, patient had steak for dinner on Saturday.Continues to work with physical therapy, but still requires skilled nursing facility on discharge.   Objective: Vital signs in last 24 hours: Blood pressure 105/63, pulse 95, temperature 98.5 F (36.9 C), temperature source Oral, resp. rate 16, height 6\' 2"  (1.88 m), weight 143 lb 4.8 oz (65 kg), SpO2 98 %.  Intake/Output from previous day: 10/21 0701 - 10/22 0700 In: 3100 [P.O.:600; I.V.:2500] Out: 2525 [Urine:1025; Drains:1500]  Physical Exam:  Patient appears to be slightly stronger than yesterday. Awake, alert, oriented in no acute distress. HEENT: PERRLA, EOMI, moist mucous membranes Lungs: clear to auscultation bilaterally, no expiratory wheezing Cardiac: improved heart rate, no murmurs. Abdomen: soft, nontender and nondistended. Extremities: no peripheral edema   Portacath/PICC-without erythema. Right-sided percutaneous nephrostomy in place with clear urine. Foley catheter is in place draining clear urine.  Lab Results:  Recent Labs  09/23/17 0416 09/24/17 0506  WBC 44.6* 13.7*  HGB 9.0* 8.0*  HCT 28.2* 26.1*  PLT 177 145*    BMET  Recent Labs  09/23/17 0416 09/24/17 0506  NA 137 139  K 4.3 3.9  CL 105 107  CO2 24 27  GLUCOSE 87 98  BUN <5* <5*  CREATININE 0.30* <0.30*  CALCIUM 6.5* 6.5*    Lab Results  Component Value Date   CEA1 0.7 06/28/2017    Studies/Results: No results found.  Medications: I have reviewed the patient's current medications.  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 At the present time, patient has recovered sufficiently to considering initiation of therapy. He remains insistent on continuing to pursue systemic therapy for his malignancy. At this time, he's not a candidate for full dose R-CHOP, but mini-R-CHOP can be considered Having been shown to be reasonably effective and significantly less toxic when used in elderly patients. Change in the therapy has been discussed with the patient extensively. He understands that his chance of curative outcome have been diminished, but is willing to proceed with the current therapy. In addition to those reducing his chemotherapy, I will add prophylactic methotrexate injection intrathecally for which the patient scheduled tomorrow. Based on severe infectious complications of the first cycle, this treatment will be administered with Neulasta support.  **DLBCL: reasonably advanced this is with management complicated by patient's performance standards as outlined above. Patient has completed IT MTX and systemic chemotherapy without significant complications. Patient completed G-CSF therapy after chemo. --Will need PET-CT as outpatient prior to the next chemotherapy cycle --Will continue Prednisone 10-20mg  PO QDay on discharge  **Volume/Electrolytes: Hypovolemia most likely related to poor oral intake despite the patient's assurance of drinking lots of fluids. He clearly response to parenteral fluid supplementation and will continue that overnight. Additionally, patient continues to have significant decreased calcium level. This is partially attributable to hyperbilirubinemia, but also to the effect of previously administered denosumab.  K 3.9, Mg 1.7, Phos 2.3, Ca 6.5 -- all stable. --Continue electrolyte replacement PO  **Diet/Activity: --Continue PT/OT/Nutritional support  **PPx: --SQ enoxaparin prophylaxis --Decubitus ppx, will likely consult Wound Nurse tomorrow for additional input --PPI for  stress ulcer prophylaxis  **Code Status: Modified code  **Disposition:  --Will observe one more day on  PO eletrolytes, discharge to SNF tomorrow --F/u in my clinic next Monday with labs, clinic and IV fluids    LOS: 7 days   Ardath Sax, MD   09/24/2017, 1:51 PM

## 2017-09-24 NOTE — Progress Notes (Signed)
PT Cancellation Note  Patient Details Name: Cory Ibarra MRN: [id number] DOB: [birth year]   Cancelled Treatment:     Pt declined at this time stated he jusr received pain meds and wanted to rest.  Pt has been evaluated by PT with rec SNF.  Will attempt to see next day or so pending schedule.    Rica Koyanagi  PTA [facility]  Acute  Rehab Pager      [phone]

## 2017-09-24 NOTE — Progress Notes (Signed)
Nutrition Follow-up  DOCUMENTATION CODES:   Severe malnutrition in context of chronic illness, Underweight  INTERVENTION:   -Continue Ensure Enlive po BID, each supplement provides 350 kcal and 20 grams of protein -Continue Magic cup TID with meals, each supplement provides 290 kcal and 9 grams of protein -Continue Multivitamin with minerals daily  RD will continue to monitor  NUTRITION DIAGNOSIS:   Malnutrition (severe) related to cancer and cancer related treatments, chronic illness as evidenced by percent weight loss, energy intake < or equal to 75% for > or equal to 1 month, moderate depletion of body fat, mild depletion of muscle mass.  Ongoing.  GOAL:   Patient will meet greater than or equal to 90% of their needs  Progressing.  MONITOR:   PO intake, Supplement acceptance, Labs, Weight trends, I & O's  ASSESSMENT:   56 y.o. 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 Patient is undergoing systemic chemotherapy with R-CHOP combination, having received the initial cycle five weeks ago. 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 Patient suffered protracted recovery due to severe malnutrition, failure to thrive, and severe deconditioning. Finally, he has been able to return to the clinic to consider the second cycle of therapy.Due to severe deterioration of performance status from a ECOG 1 down to ECOG 3, Decision was made to attenuate his next cycle of chemotherapy as outlined below. In addition, presentation in the clinic patient was found to be hypotensive and tachycardic, and IV fluids were administered. Patient's blood pressure and heart rate responded positively to the first letter fluids, but due to prolonged administration of fluids, and chemotherapy was  unable to proceed as outpatient.   Pt's intake and appetite have improved.  PO intakes on 10/21: B: 20% L:40%  Drank 2 Ensure supplements (700 kcal, 40g protein).  Pt consumed 25% of his breakfast this morning. Weight has remained stable.  Medications: OSCAL-D tablet BID, Marinol capsule daily, MAG-OX tablet daily, Multivitamin with minerals daily, Protonix tablet daily, PHOS-NAK packet daily, K-DUR tablet daily, D5 -.9% NaCl infusion at 100 ml/hr -provides  408 kcal  Labs reviewed: Low Phos Mg/K WNL  Diet Order:  Diet regular Room service appropriate? Yes; Fluid consistency: Thin  Skin:  Wound (see comment) (mid sacrum wound)  Last BM:  10/21  Height:   Ht Readings from Last 1 Encounters:  09/20/17 6\' 2"  (1.88 m)    Weight:   Wt Readings from Last 1 Encounters:  09/24/17 143 lb 4.8 oz (65 kg)    Ideal Body Weight:  86.3 kg  BMI:  Body mass index is 18.4 kg/m.  Estimated Nutritional Needs:   Kcal:  2200-2400  Protein:  105-115g  Fluid:  2.4L/day  EDUCATION NEEDS:   No education needs identified at this time  Clayton Bibles, MS, RD, [facility] Dietitian Pager: [phone] After Hours Pager: [phone]

## 2017-09-25 ENCOUNTER — Telehealth: Payer: Self-pay

## 2017-09-25 ENCOUNTER — Ambulatory Visit: Payer: Self-pay | Admitting: Hematology and Oncology

## 2017-09-25 ENCOUNTER — Other Ambulatory Visit: Payer: Self-pay

## 2017-09-25 ENCOUNTER — Telehealth: Payer: Self-pay | Admitting: Hematology and Oncology

## 2017-09-25 ENCOUNTER — Other Ambulatory Visit: Payer: Self-pay | Admitting: Hematology and Oncology

## 2017-09-25 DIAGNOSIS — R110 Nausea: Secondary | ICD-10-CM

## 2017-09-25 DIAGNOSIS — C8338 Diffuse large B-cell lymphoma, lymph nodes of multiple sites: Secondary | ICD-10-CM

## 2017-09-25 DIAGNOSIS — T451X5A Adverse effect of antineoplastic and immunosuppressive drugs, initial encounter: Secondary | ICD-10-CM

## 2017-09-25 DIAGNOSIS — C7951 Secondary malignant neoplasm of bone: Secondary | ICD-10-CM

## 2017-09-25 DIAGNOSIS — K5900 Constipation, unspecified: Secondary | ICD-10-CM

## 2017-09-25 LAB — URIC ACID: Uric Acid, Serum: 1.6 mg/dL — ABNORMAL LOW (ref 4.4–7.6)

## 2017-09-25 LAB — MAGNESIUM: MAGNESIUM: 1.6 mg/dL — AB (ref 1.7–2.4)

## 2017-09-25 LAB — VITAMIN D 25 HYDROXY (VIT D DEFICIENCY, FRACTURES): VIT D 25 HYDROXY: 8.3 ng/mL — AB (ref 30.0–100.0)

## 2017-09-25 LAB — PHOSPHORUS: PHOSPHORUS: 2 mg/dL — AB (ref 2.5–4.6)

## 2017-09-25 MED ORDER — PREDNISONE 10 MG PO TABS: 10.0000 mg | ORAL_TABLET | Freq: Every day | ORAL | 0 refills | Status: DC

## 2017-09-25 MED ORDER — OXYCODONE HCL ER 10 MG PO T12A: 10.0000 mg | EXTENDED_RELEASE_TABLET | Freq: Two times a day (BID) | ORAL | 0 refills | Status: DC

## 2017-09-25 MED ORDER — DRONABINOL 5 MG PO CAPS: 5.0000 mg | ORAL_CAPSULE | Freq: Every day | ORAL | 0 refills | Status: DC

## 2017-09-25 MED ORDER — ADULT MULTIVITAMIN W/MINERALS CH: 1.0000 | ORAL_TABLET | Freq: Every day | ORAL | Status: DC

## 2017-09-25 MED ORDER — MAGNESIUM OXIDE 400 (241.3 MG) MG PO TABS
400.0000 mg | ORAL_TABLET | Freq: Two times a day (BID) | ORAL | Status: DC
  Administered 2017-09-25: 400 mg via ORAL
  Filled 2017-09-25: qty 1

## 2017-09-25 MED ORDER — LORAZEPAM 0.5 MG PO TABS: 0.5000 mg | ORAL_TABLET | Freq: Four times a day (QID) | ORAL | 0 refills | Status: DC | PRN

## 2017-09-25 MED ORDER — OXYCODONE HCL 10 MG PO TABS: 10.0000 mg | ORAL_TABLET | ORAL | 0 refills | Status: DC

## 2017-09-25 MED ORDER — MAGNESIUM OXIDE 400 (241.3 MG) MG PO TABS: 400.0000 mg | ORAL_TABLET | Freq: Two times a day (BID) | ORAL | 0 refills | Status: DC

## 2017-09-25 MED ORDER — POTASSIUM & SODIUM PHOSPHATES 280-160-250 MG PO PACK: 1.0000 | PACK | Freq: Three times a day (TID) | ORAL | 0 refills | Status: DC

## 2017-09-25 MED ORDER — PREDNISONE 5 MG PO TABS
10.0000 mg | ORAL_TABLET | Freq: Every day | ORAL | Status: DC
  Administered 2017-09-25: 10 mg via ORAL
  Filled 2017-09-25: qty 2

## 2017-09-25 MED ORDER — HEPARIN SOD (PORK) LOCK FLUSH 100 UNIT/ML IV SOLN
500.0000 [IU] | INTRAVENOUS | Status: AC | PRN
  Administered 2017-09-25: 500 [IU]
  Filled 2017-09-25: qty 5

## 2017-09-25 MED ORDER — POTASSIUM & SODIUM PHOSPHATES 280-160-250 MG PO PACK
1.0000 | PACK | Freq: Three times a day (TID) | ORAL | Status: DC
  Administered 2017-09-25: 1 via ORAL
  Filled 2017-09-25 (×3): qty 1

## 2017-09-25 MED ORDER — CALCIUM CARBONATE-VITAMIN D 500-200 MG-UNIT PO TABS: 1.0000 | ORAL_TABLET | Freq: Two times a day (BID) | ORAL | 0 refills | Status: DC

## 2017-09-25 MED ORDER — ENOXAPARIN SODIUM 40 MG/0.4ML ~~LOC~~ SOLN: 40.0000 mg | SUBCUTANEOUS | Status: DC

## 2017-09-25 NOTE — Progress Notes (Unsigned)
cbc

## 2017-09-25 NOTE — Telephone Encounter (Signed)
Pt currently admitted. Appt with Dr. [name] cancelled today. Scheduling message sent to have pt come in on Monday 10/29 for labs and hospital f/u.

## 2017-09-25 NOTE — Progress Notes (Signed)
Pt returning to [facility] today, room 131, report [phone].  Pt will need PTAR- transport- CSW completed medical necessity form and will arrange transportation.  Provided all information to facility via the [place]. Facility in need of hard copy scripts of controlled substances- CSW paged MD to request. Once completed will include in transfer packet.  Pt states he will inform family (brother) of plan.

## 2017-09-25 NOTE — Progress Notes (Signed)
Report called to [place] at [place]. All questions answered . PTAR on unit to transfer patient.

## 2017-09-25 NOTE — Telephone Encounter (Signed)
RX faxed to [facility] @ [phone], phone number [phone] 

## 2017-09-25 NOTE — Discharge Summary (Addendum)
Physician Discharge Summary  Patient ID: Cory Ibarra MRN: [id number] DOB/AGE: 58-Jan-1962 58 y.o.  Admit date: 09/17/2017 Discharge date: 09/25/2017  Primary Care Physician:  [name], [name]   Discharge Diagnoses:   Diffuse large b-cell lymphoma of multiple sites Admission for Chemotherapy administration Protein/Calorie malnutrition Hypokalemia Hypomagnesemia Hypocalcemia Hypophosphatemia  Present on Admission: . Diffuse large B cell lymphoma ([place]) . Bony metastasis ([place]) . Hypovolemia . DLBCL (diffuse large B cell lymphoma) ([place])   Discharge Medications:  Allergies as of 09/25/2017      Reactions   Bee Venom Swelling      Medication List    STOP taking these medications   potassium phosphate (monobasic) 500 MG tablet Commonly known as:  K-PHOS ORIGINAL     TAKE these medications   calcium-vitamin D 500-200 MG-UNIT tablet Commonly known as:  OSCAL WITH D Take 1 tablet by mouth 2 (two) times daily.   chlorhexidine 0.12 % solution Commonly known as:  PERIDEX 15 mLs by Mouth Rinse route 2 (two) times daily.   dronabinol 5 MG capsule Commonly known as:  MARINOL Take 1 capsule (5 mg total) by mouth daily before lunch.   enoxaparin 40 MG/0.4ML injection Commonly known as:  LOVENOX Inject 0.4 mLs (40 mg total) into the skin daily.   Lidocaine-Prilocaine (Bulk) 7.8-2.9 % Crea 1 application by Does not apply route daily.   LORazepam 0.5 MG tablet Commonly known as:  ATIVAN Take 1 tablet (0.5 mg total) by mouth every 6 (six) hours as needed (Nausea or vomiting).   magnesium oxide 400 (241.3 Mg) MG tablet Commonly known as:  MAG-OX Take 1 tablet (400 mg total) by mouth 2 (two) times daily.   multivitamin with minerals Tabs tablet Take 1 tablet by mouth daily.   NUTRITIONAL DRINK PO Take 237 mLs by mouth 2 (two) times daily.   OLANZapine zydis 5 MG disintegrating tablet Commonly known as:  ZYPREXA Take 0.5 tablets (2.5 mg total) by mouth  at bedtime.   ondansetron 8 MG tablet Commonly known as:  ZOFRAN Take 1 tablet (8 mg total) by mouth every 8 (eight) hours as needed for nausea or vomiting.   oxyCODONE 10 mg 12 hr tablet Commonly known as:  OXYCONTIN Take 1 tablet (10 mg total) by mouth every 12 (twelve) hours. What changed:  Another medication with the same name was changed. Make sure you understand how and when to take each.   Oxycodone HCl 10 MG Tabs Take 1 tablet (10 mg total) by mouth every 4 (four) hours. What changed:  medication strength  how much to take  when to take this  reasons to take this   pantoprazole 40 MG tablet Commonly known as:  PROTONIX Take 1 tablet (40 mg total) by mouth daily at 12 noon. What changed:  when to take this   polyethylene glycol packet Commonly known as:  MIRALAX / GLYCOLAX Take 17 g by mouth daily as needed for mild constipation.   potassium & sodium phosphates 280-160-250 MG Pack Commonly known as:  PHOS-NAK Take 1 packet by mouth 4 (four) times daily -  before meals and at bedtime.   predniSONE 10 MG tablet Commonly known as:  DELTASONE Take 1 tablet (10 mg total) by mouth daily with breakfast.   prochlorperazine 10 MG tablet Commonly known as:  COMPAZINE Take 1 tablet (10 mg total) by mouth every 6 (six) hours as needed for nausea or vomiting.  Discharge Care Instructions        Start     Ordered   09/25/17 0000  Discharge wound care:    Comments:  Continue monitoring for skin integrity. Due to malnutrition, patient remains at high risk for decubitus ulcer formation   09/25/17 0839       Disposition and Follow-up:  --Discharge to SNF to continue rehabilitation, provide close support for ADLs --Patient will f/u in my clinic on 10/01/17: labs, clinic visit, possible IV fluids --PET-CT next week for disease response assessment --Next chemotherapy cycle in 2 weeks  Significant Diagnostic Studies:  Dg Fluoro Guided Loc Of Needle/cath  Tip For Spinal Inject Lt  Result Date: 09/18/2017 CLINICAL DATA:  B-cell lymphoma.  For intrathecal chemotherapy EXAM: DIAGNOSTIC LUMBAR PUNCTURE UNDER FLUOROSCOPIC GUIDANCE FLUOROSCOPY TIME:  Fluoroscopy Time:  0 minutes 16 second Radiation Exposure Index (if provided by the fluoroscopic device): Number of Acquired Spot Images: 0 PROCEDURE: Informed consent was obtained from the patient prior to the procedure, including potential complications of headache, allergy, and pain. With the patient prone, the lower back was prepped with Betadine. 1% Lidocaine was used for local anesthesia. Lumbar puncture was performed at the L4-5 level using a 20 gauge needle with return of slightly blood-tinged CSF which cleared, with an opening pressure of 9 cm water. 13.5 ml of CSF were obtained for laboratory studies. 12 mg of methotrexate was injected into the subarachnoid space. The patient tolerated the procedure well and there were no apparent complications. IMPRESSION: Successful lumbar puncture and injection of intrathecal methotrexate Electronically Signed   By: Franchot Gallo M.D.   On: 09/18/2017 13:24    Discharge Laboratory Values: Results for Cory Ibarra, Cory Ibarra (MRN [id number]) as of 09/25/2017 10:13  Ref. Range 09/24/2017 05:06 09/25/2017 05:00  Sodium Latest Ref Range: 135 - 145 mmol/L 139   Potassium Latest Ref Range: 3.5 - 5.1 mmol/L 3.9   Chloride Latest Ref Range: 101 - 111 mmol/L 107   CO2 Latest Ref Range: 22 - 32 mmol/L 27   Glucose Latest Ref Range: 65 - 99 mg/dL 98   BUN Latest Ref Range: 6 - 20 mg/dL <5 (L)   Creatinine Latest Ref Range: 0.61 - 1.24 mg/dL <0.30 (L)   Calcium Latest Ref Range: 8.9 - 10.3 mg/dL 6.5 (L)   Anion gap Latest Ref Range: 5 - 15  5   Phosphorus Latest Ref Range: 2.5 - 4.6 mg/dL 2.3 (L) 2.0 (L)  Magnesium Latest Ref Range: 1.7 - 2.4 mg/dL 1.7 1.6 (L)  Alkaline Phosphatase Latest Ref Range: 38 - 126 U/L 213 (H)   Albumin Latest Ref Range: 3.5 - 5.0 g/dL 2.2 (L)   Uric Acid,  Serum Latest Ref Range: 4.4 - 7.6 mg/dL 1.6 (L) 1.6 (L)  AST Latest Ref Range: 15 - 41 U/L 75 (H)   ALT Latest Ref Range: 17 - 63 U/L 79 (H)   Total Protein Latest Ref Range: 6.5 - 8.1 g/dL 4.9 (L)   Total Bilirubin Latest Ref Range: 0.3 - 1.2 mg/dL 1.2   GFR, Est Non African American Latest Ref Range: >60 mL/min NOT CALCULATED   GFR, Est African American Latest Ref Range: >60 mL/min NOT CALCULATED   Vitamin D, 25-Hydroxy Latest Ref Range: 30.0 - 100.0 ng/mL 8.3 (L)   WBC Latest Ref Range: 4.0 - 10.5 K/uL 13.7 (H)   RBC Latest Ref Range: 4.22 - 5.81 MIL/uL 3.33 (L)   Hemoglobin Latest Ref Range: 13.0 - 17.0 g/dL 8.0 (L)  HCT Latest Ref Range: 39.0 - 52.0 % 26.1 (L)   MCV Latest Ref Range: 78.0 - 100.0 fL 78.4   MCH Latest Ref Range: 26.0 - 34.0 pg 24.0 (L)   MCHC Latest Ref Range: 30.0 - 36.0 g/dL 30.7   RDW Latest Ref Range: 11.5 - 15.5 % 19.3 (H)   Platelets Latest Ref Range: 150 - 400 K/uL 145 (L)   Neutrophils Latest Units: % 98   Lymphocytes Latest Units: % 1   Monocytes Relative Latest Units: % 0   Eosinophil Latest Units: % 1   Basophil Latest Units: % 0   NEUT# Latest Ref Range: 1.7 - 7.7 K/uL 13.4 (H)   Lymphocyte # Latest Ref Range: 0.7 - 4.0 K/uL 0.2 (L)   Monocyte # Latest Ref Range: 0.1 - 1.0 K/uL 0.0 (L)   Eosinophils Absolute Latest Ref Range: 0.0 - 0.7 K/uL 0.1   Basophils Absolute Latest Ref Range: 0.0 - 0.1 K/uL 0.0    Brief H and P: For complete details please refer to admission H and P, but in brief, patient is a 56 year old male with recent diagnosis of diffuse large B-cell lymphoma with multifocal disease especially in the skeletal structures. She was started on curative-intent chemotherapy with R-CHOP, but developed severe infection following the first cycle of chemotherapy resulting in significant delay with a subsequent treatment. His physical performance status and nutritional state have deteriorated dramatically and the second cycle of therapy was administered  as an inpatient for close monitoring with significant dose reduction in all component medications except for rituximab. On admission, patient had significant hypovolemia with hypotension and tachycardia which responded well to IV fluid supplementation. Additionally, multiple electrolyte abnormalities were identified including hypokalemia, hypomagnesemia, hypocalcemia, and hypophosphatemia.   Physical Exam at Discharge: BP 106/66 (BP Location: Left Arm)   Pulse 96   Temp 98.1 F (36.7 C) (Oral)   Resp 16   Ht 6\' 2"  (1.88 m)   Wt 142 lb 6.7 oz (64.6 kg)   SpO2 98%   BMI 18.29 kg/m  GENERAL:alert, no distress and comfortable; cachectic  SKIN: skin color, texture, turgor are normal, no rashes or significant lesions EYES: normal, conjunctiva are pink and non-injected, sclera clear OROPHARYNX:no exudate, no erythema and lips, buccal mucosa, and tongue normal  NECK: supple, thyroid normal size, non-tender, without nodularity LYMPH: no palpable lymphadenopathy in the cervical, axillary or inguinal LUNGS: clear to auscultation and percussion with normal breathing effort HEART: regular rate & rhythm and no murmurs and no lower extremity edema ABDOMEN:abdomen soft, midline surgical incision is well-healed. No tenderness, no organomegaly. Bowel sounds normal. Musculoskeletal:no cyanosis of digits and no clubbing;  extensive muscle atrophy compared to initial presentation  PSYCH: alert & oriented x 3 with fluent speech NEURO: no focal motor/sensory deficits   Hospital Course:  Principal Problem:   Diffuse large B cell lymphoma ([place]) Active Problems:   Bony metastasis ([facility])   Hypovolemia   Chemotherapy-induced nausea   Constipation   DLBCL (diffuse large B cell lymphoma) ([facility])   Hypocalcemia   Severe protein-calorie malnutrition ([facility])   Hypokalemia   Hypomagnesemia   Electrolyte abnormality   Liver function test abnormality  **DLBCL with skeletal metastasis: Patient received dose  reduced [name] chemotherapy called mini-CHOP usually reserved for elderly patients over 16 years of age but deems appropriate in this case due to severe functional decline in the patient. Patient tolerated infusions without complications. Due to his extensive skeletal disease, prophylactic intrathecal methotrexate was added to the  treatment, administered by interventional radiology. Patient tolerated the initial injection without significant difficulties other than mild headache and back pain on the day of infusion. Current plan is to proceed with disease assessment in approximately 1 week time with PET/CT followed by additional 4 cycles of chemotherapy. We will attempt to increase the dose is back to normal levels if patient's performance status continues to improve. Future, we will be avoiding use of Denosumab due to persistent hypocalcemia. Intrathecal methotrexate will be administered with each cycle of systemic chemotherapy.  **Hypovolemia: Likely due to oral intake of fluids prior to admission. Patient was aggressively hydrated with 100 mL of D5 normal saline over throughout the hospitalization. Volume status have improved, hypotension and tachycardia have resolved. She and demonstrated improved weight and excellent urine output while hospitalized.   **Chemotherapy-induced Nausea: Patient responded well to standard protocols of nausea control including ondansetron, prochlorperazine, and lorazepam.   **Constipation: Patient has been able to resume regular bowel output with magnesium oxide, senna, docusate, MiraLAX used.  **Electrolyte abnormalities: Patient had hypokalemia, hypomagnesemia, hypocalcemia, and hypophosphatemia throughout the hospitalization. Most of these are due to poor oral intake and possible renal loss due to systemic chemotherapy. Aggressive parenteral administration was required to stabilize and improve the levels. Patient demonstrates stable electrolyte levels at this time on oral  supplementation. As the levels this morning are slightly lower than the ones measured yesterday, we have intensified oral supplementation of potassium, magnesium, and phosphorus. Before meals discharge medications were up-to-date schedule.   Diet:   --Regular diet with some supplemental nutrition by administering Ensure 2 cans daily. Please see him recommendations for additional details  Activity:   --Please see physical therapy recommendations regarding the activity level tolerated by the patient. He still requires significant assistance in the transitions from bed to chair and ambulation. Needs to continue physical rehabilitation which is critical for his successful disease treatment course.  Condition at Discharge:    --Stable  Ardath Sax 09/25/2017

## 2017-09-25 NOTE — Telephone Encounter (Signed)
Called [facility] - Rn for patient called to schedule appt for patient - scheduled appt per 10/23 sch message .   Lewie Loron at [place] is aware of appt date and time.

## 2017-09-25 NOTE — Progress Notes (Signed)
OT Cancellation Note  Patient Details Name: Patrice Moates MRN: [id number] DOB: [birth year]   Cancelled Treatment:    Reason Eval/Treat Not Completed: Pain limiting ability to participate.  Pt states he just took a pain pill and is trying to rest and let the pain settle down. Will check back another day.  Bunny Kleist 09/25/2017, 10:22 AM  Lesle Chris, OTR/L [phone] 09/25/2017

## 2017-09-26 ENCOUNTER — Encounter: Payer: Self-pay | Admitting: Adult Health

## 2017-09-26 ENCOUNTER — Telehealth: Payer: Self-pay

## 2017-09-26 ENCOUNTER — Non-Acute Institutional Stay (SKILLED_NURSING_FACILITY): Payer: Medicaid Other | Admitting: Adult Health

## 2017-09-26 DIAGNOSIS — N200 Calculus of kidney: Secondary | ICD-10-CM | POA: Diagnosis not present

## 2017-09-26 DIAGNOSIS — R110 Nausea: Secondary | ICD-10-CM

## 2017-09-26 DIAGNOSIS — E878 Other disorders of electrolyte and fluid balance, not elsewhere classified: Secondary | ICD-10-CM | POA: Diagnosis not present

## 2017-09-26 DIAGNOSIS — T451X5A Adverse effect of antineoplastic and immunosuppressive drugs, initial encounter: Secondary | ICD-10-CM | POA: Diagnosis not present

## 2017-09-26 DIAGNOSIS — F4323 Adjustment disorder with mixed anxiety and depressed mood: Secondary | ICD-10-CM | POA: Diagnosis not present

## 2017-09-26 DIAGNOSIS — S32030D Wedge compression fracture of third lumbar vertebra, subsequent encounter for fracture with routine healing: Secondary | ICD-10-CM

## 2017-09-26 DIAGNOSIS — C8338 Diffuse large B-cell lymphoma, lymph nodes of multiple sites: Secondary | ICD-10-CM | POA: Diagnosis not present

## 2017-09-26 NOTE — Telephone Encounter (Signed)
Left VM with patient that PT would need to consult surgeon to determine the need for the "turtle-shell" brace during PT sessions. Dr. [name] will defer to his/her judgement regarding PT.

## 2017-09-26 NOTE — Progress Notes (Signed)
Location:   [facility] Room Number: 131 A Place of Service:  SNF (31)   CODE STATUS: Full code  Allergies  Allergen Reactions  . Bee Venom Swelling    Chief Complaint  Patient presents with  . 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 He has been started on R-CHOP; he did have difficulties tolerating the first cycle of the chemotherapy; which did cause a delay in subsequent treatment. Upon his admission he had significant electrolyte imbalances; with significant hypovolemia. He has responded IVF.  He continues to be here for short term rehab with his goal to return back home.  He denies any worsening weakness; pain.    Past Medical History:  Diagnosis Date  . Cancer ([place])   . Tendonitis     Past Surgical History:  Procedure Laterality Date  . CYSTOSCOPY W/ URETERAL STENT PLACEMENT Right 08/21/2017   Procedure: CYSTOSCOPY WITH RETROGRADE PYELOGRAM, RIGHT;  Surgeon: Alexis Frock, MD;  Location: [facility];  Service: Urology;  Laterality: Right;  . HERNIA REPAIR  1988   Dr. Vida Rigger  . IR FLUORO GUIDE PORT INSERTION RIGHT  07/26/2017  . IR NEPHROSTOMY PLACEMENT RIGHT  08/21/2017  . IR US GUIDE VASC ACCESS RIGHT  07/26/2017    Social History   Social History  . Marital status: Single    Spouse name: N/A  . Number of children: 0  . Years of education: N/A   Occupational History  . Not on file.   Social History Main Topics  . Smoking status: Never Smoker  . Smokeless tobacco: Never Used  . Alcohol use Yes     Comment: occasionally/socially  . Drug use: No  . Sexual activity: No   Other Topics Concern  . 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 Never used smokeless tobacco.   No  e-cigarettes   Occasional alcohol consumption socially.   Single, no children.   Family History  Problem Relation Age of Onset  . Cancer Father       VITAL SIGNS BP 106/66   Pulse 98   Temp (!) 97.5 F (36.4 C)   Resp 18   Ht 6' 2"  (1.88 m)   Wt 144 lb 3.2 oz (65.4 kg)   SpO2 100%   BMI 18.51 kg/m   Patient's Medications  New Prescriptions   No medications on file  Previous Medications   CALCIUM-VITAMIN D (OSCAL WITH D) 500-200 MG-UNIT TABLET    Take 1 tablet by mouth 2 (two) times daily.   CHLORHEXIDINE (PERIDEX) 0.12 % SOLUTION    15 mLs by Mouth Rinse route 2 (two) times daily.   DRONABINOL (MARINOL) 5 MG CAPSULE    Take 1 capsule (5 mg total) by mouth daily before lunch.   ENOXAPARIN (LOVENOX) 40 MG/0.4ML INJECTION    Inject 0.4 mLs (40 mg total) into the skin daily.   LIDOCAINE-PRILOCAINE, BULK, 2.5-2.5 % CREA    Apply to affected areas topically one time daily   LORAZEPAM (ATIVAN) 0.5 MG TABLET    Take 1 tablet (0.5 mg total) by mouth every 6 (six) hours as needed (Nausea or vomiting).   MAGNESIUM OXIDE (MAG-OX) 400 (241.3 MG) MG TABLET  Take 1 tablet (400 mg total) by mouth 2 (two) times daily.   MULTIPLE VITAMIN (MULTIVITAMIN WITH MINERALS) TABS TABLET    Take 1 tablet by mouth daily.   NUTRITIONAL SUPPLEMENTS (NUTRITIONAL DRINK PO)    Take 237 mLs by mouth 2 (two) times daily.   OLANZAPINE ZYDIS (ZYPREXA) 5 MG DISINTEGRATING TABLET    Take 0.5 tablets (2.5 mg total) by mouth at bedtime.   ONDANSETRON (ZOFRAN) 8 MG TABLET    Take 1 tablet (8 mg total) by mouth every 8 (eight) hours as needed for nausea or vomiting.   OXYCODONE (OXYCONTIN) 10 MG 12 HR TABLET    Take 1 tablet (10 mg total) by mouth every 12 (twelve) hours.   OXYCODONE HCL 10 MG TABS    Take 1 tablet (10 mg total) by mouth every 4 (four) hours.   PANTOPRAZOLE (PROTONIX) 40 MG TABLET    Take 40 mg by mouth daily.    POLYETHYLENE GLYCOL (MIRALAX / GLYCOLAX) PACKET    Take 17 g by mouth daily as needed  for mild constipation.   POTASSIUM & SODIUM PHOSPHATES (PHOS-NAK) 280-160-250 MG PACK    Take 1 packet by mouth 4 (four) times daily -  before meals and at bedtime.   PREDNISONE (DELTASONE) 10 MG TABLET    Take 1 tablet (10 mg total) by mouth daily with breakfast.   PROCHLORPERAZINE (COMPAZINE) 10 MG TABLET    Take 1 tablet (10 mg total) by mouth every 6 (six) hours as needed for nausea or vomiting.  Modified Medications   No medications on file  Discontinued Medications   PANTOPRAZOLE (PROTONIX) 40 MG TABLET    Take 1 tablet (40 mg total) by mouth daily at 12 noon.     SIGNIFICANT DIAGNOSTIC EXAMS  PREVIOUS:   08-21-17:chest x-ray: No active cardiopulmonary disease.  08-21-17: lumbar spine complete x-ray: There is again noted fracture L3 vertebral body. Tumor extends into and involves the left L3 pedicle. This finding was present on prior lumbar MR. No new bone lesions are evident. No new fracture. No spondylolisthesis. There is facet osteoarthritic change at L4-5 L5-S1 bilaterally. There is also loss of the pedicle on the right at T12, indicative of bony metastatic disease in this area.  08-21-17: thoracic spine x-ray: No fracture or spondylolisthesis. There are multiple metastatic foci Seen in the thoracic spine performed three months prior. By radiography, there is loss of the pedicle of T12 on the right and subtle loss of the lateral aspect of the pedicle of T8 on the left. 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 No other neoplastic foci identified by radiography the bony structures. Note that there are multiple foci of tumor and thoracic vertebral bodies seen on recent MR not seen by radiography currently.   08-22-17: placement of right nephrostomy placement  NO NEW EXAMS   LABS REVIEWED:  PREVIOUS:   08-21-17: wbc 0.1; hgb 9.8; hct 27.6; mcv 71.7; plt 76; glucose 144 bun 12; creat 0.63; k+ 3.5; na++ 135; ca 6.9; liver normal albumin 3.1; phos 1.9; BNP  607.1; uric acid 3.8 blood culture: enterobacteriaceae; e-coli 08-24-17: wbc 5.3; hgb 8.7; hct 249; mcv 73.7; plt 57; glucose 93; bun 11; creat 0.38; k+ 2.7; na++ 139; ca 6.3; albumin 2.3 08-25-17: blood culture: no growth  08-27-17: wbc 13.4; hgb 8.7; hct 25.2; mcv 73.5; plt 63; glucose 92; bun 7; creat 0.31; k+ 3.0; na++ 140; ca 6.3; alt 73; albumin 2.3  08-30-17: wbc 6.2; hgb  9.0; hct 26.5; mcv 74.9; plt 120; glucose 109; bun <5' creat <0.30; k+ 3.4; na++ 135; ca 6.2; liver normal albumin 2.3; mag 1.6; phos 1.7 09-03-17: wbc 6.3; hgb 10.0; hct 29.5; mcv 74.1; plt 403; glucose 103; bun 8; creat 0.38; k+ 3.1; na++ 136; mag 1.7; phos 1.9   TODAY:   09-17-17: wbc 10.4; hgb 11.6; hct 35.4; mcv 75.0; plt 453; glucose 108; bun 7.3; creat 0.5; k+ 3.5; na++ 135; ca 6.4; total bili 1.26; albumin 2.4; uric acid 6.8; mag 1.6; phos 1.9 09-21-17: wbc 30.8; hgb 8.4; hct 26.0; mcv 78.3; plt 252; uric acid 1.9; mag 1.7; phos 1.6 09-24-17: wbc 13.7; hgb 8.0; hct 26.1; mcv 78.4; plt 145; glucose 87; bun <5; creat 0.30; k+ 4.3; na+= 137; ca 6.5; ast 103; alt 76; alk phos 195; albumin 2.2; uric acid 1.6; mag 1.7; phos 2.3; vit D 8.3   Review of Systems  Constitutional: Negative for malaise/fatigue.  Respiratory: Negative for cough and shortness of breath.   Cardiovascular: Negative for chest pain, palpitations and leg swelling.  Gastrointestinal: Negative for abdominal pain, constipation and heartburn.  Musculoskeletal: Negative for back pain, joint pain and myalgias.  Skin: Negative.   Neurological: Negative for dizziness.  Psychiatric/Behavioral: The patient is not nervous/anxious.    Physical Exam  Constitutional: He is oriented to person, place, and time. No distress.  Frail   Eyes: Conjunctivae are normal.  Neck: Neck supple. No thyromegaly present.  Cardiovascular: Normal rate, regular rhythm and intact distal pulses.  Murmur heard. 1/6  Pulmonary/Chest: Effort normal and breath sounds normal. No  respiratory distress.  Abdominal: Soft. Bowel sounds are normal. He exhibits no distension. There is no tenderness.  Genitourinary:  Genitourinary Comments: Right nephrostomy stent   Musculoskeletal: He exhibits no edema.  Able to move all extremities   Lymphadenopathy:    He has no cervical adenopathy.  Neurological: He is alert and oriented to person, place, and time.  Skin: Skin is warm and dry. He is not diaphoretic.  Has portacath right chest   Psychiatric: He has a normal mood and affect.     ASSESSMENT/ PLAN:  TODAY:  1. 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 Was diagnosed in July 2018. He has had a chemo of reduced [name] called mini-CHOP (october 2018) he did receive intrathecal methotrexate. The goal is to increase the dose back to normal levels. His weight is 144 pounds is taking marinol 5  Mg prior to lunch. For his pain management is taking oxycontin 10 mg twice daily and oxycodone 5 mg every 4 hours as needed is receiving lovenox 40 mg daily and prednisone 10 mg daily   2. GERD: stable will continue protonix 40 mg daily   3. Electrolyte imbalance: including k+; mag; ca++; phosphate:  stable will continue phos nak 280-160-250 four times daily mag ox 400 mg twice daily   4.  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  5. Closed compression fracture of L3 lumbar vertebra: without change will continue therapy as directed has oxycodone 5 mg every 4 hours as needed is taking oxycontin 10 mg twice daily   6. Obstructing right ureteral stone: stable is status post right percutaneous nephrostomy; will monitor   7. Chemo induced nausea and vomiting: without change will continue compazine 10 mg every  6 hours as needed and zofran 8 mg every 8 hours as needed   Will check cbc; cmp; mag phos    MD  is aware of resident's narcotic use and is in agreement with current plan of care. We will attempt to wean resident as apropriate     Ok Edwards NP [facility]  Contact [phone] Monday through Friday 8am- 5pm  After hours call [phone]

## 2017-09-27 ENCOUNTER — Telehealth: Payer: Self-pay

## 2017-09-27 ENCOUNTER — Encounter: Payer: Self-pay | Admitting: Internal Medicine

## 2017-09-27 ENCOUNTER — Non-Acute Institutional Stay (SKILLED_NURSING_FACILITY): Payer: Medicaid Other | Admitting: Internal Medicine

## 2017-09-27 DIAGNOSIS — K219 Gastro-esophageal reflux disease without esophagitis: Secondary | ICD-10-CM | POA: Diagnosis not present

## 2017-09-27 DIAGNOSIS — C8338 Diffuse large B-cell lymphoma, lymph nodes of multiple sites: Secondary | ICD-10-CM

## 2017-09-27 DIAGNOSIS — E878 Other disorders of electrolyte and fluid balance, not elsewhere classified: Secondary | ICD-10-CM

## 2017-09-27 DIAGNOSIS — F4323 Adjustment disorder with mixed anxiety and depressed mood: Secondary | ICD-10-CM | POA: Diagnosis not present

## 2017-09-27 DIAGNOSIS — C7951 Secondary malignant neoplasm of bone: Secondary | ICD-10-CM

## 2017-09-27 DIAGNOSIS — K5903 Drug induced constipation: Secondary | ICD-10-CM

## 2017-09-27 DIAGNOSIS — E43 Unspecified severe protein-calorie malnutrition: Secondary | ICD-10-CM | POA: Diagnosis not present

## 2017-09-27 NOTE — Telephone Encounter (Signed)
Returned call from Candis Shine, PT with [place] at [phone]. Requested clarification on use of turtle shell brace during PT. Shared that Dr. [name] will defer to pt surgeon to determine PT with or without brace.

## 2017-09-27 NOTE — Progress Notes (Signed)
Patient ID: Cory Ibarra, male   DOB: 10/07/1961, 56 y.o.   MRN: [id number]     HISTORY AND PHYSICAL   DATE:  September 27, 2017  Location:   [facility] Room Number: 597 A Place of Service: SNF (31)   Extended Emergency Contact Information Primary Emergency Contact: Sawchuk,Michael Address: [address] [place], [place] [address] [name] of [place] Phone: [phone] Relation: Brother Secondary Emergency Contact: Constance Haw,  [address] [name] of [place] Phone: [phone] Work Phone: [phone] Mobile Phone: [phone] Relation: Brother  Advanced Directive information Does Patient Have a Medical Advance Directive?: No, Would patient like information on creating a medical advance directive?: No - Patient declined, Does patient want to make changes to medical advance directive?: No - Patient declined  Chief Complaint  Patient presents with  . 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 He was started on mini-CHOP tx due to frail state. Intrathecal MTX also given by IR due to extensive bony mets. Aggressively hydrated with IVF. Electrolytes aggressively repleted. Total protein 4.9; albumin 2.2; AST 75; ALT 79; alk phos 213; K dropped 2.8-->3.9; corrected Ca 8.26; Mg dropped 1.4-->1.6; Phos 1.9-->2; Hgb 11.6-->8.4; WBC 10.4K-->30.8K; Plts 453K-->252K; uric acid level 1.6; Vit D 25OH level 8.3; T bilirubin 1.26-->1.2 at d/c. He presents to SNF for short term rehab.  Today he reports constipation and no BM x 4 days. He refuses medpass but does consume food brought by family/friends. Wears back brace for comfort. Tolerating tx. Family friend at bedside  Extensive diffuse large B cell lymphoma (dx July  2018) - he has multiple bony mets; followed by oncology. He has rec'd chemotx including [name] and xgeva.  His weight dropped from 168 lbs in Sept 2018 to 145 lbs in Oct 2018. Current weight 144 lbs. He takes marinol 5 mg prior to lunch; oxycontin ER 74m q12hr; oxycodone 10 mg every 4 hours for pain. He also takes compazine, prednisone and zofran. He takes lovenox injections for DVT prophylaxis  GERD - stable on protonix 40 mg daily   Electrolyte disturbance - stable on K-phos 500 mg twice daily. K 3.9; Phos 2; Mg 1.6 on Mag Oxide. Corrected Ca 8.26 and he takes Ca supplement.   Adjustment disorder with mild anxiety and depressed mood - stable on zyprexa 2.5 mg nightly and ativan 0.5 mg every 6 hours as needed for the next 14 days   Past Medical History:  Diagnosis Date  . Cancer ([place]   . Tendonitis     Past Surgical History:  Procedure Laterality Date  . CYSTOSCOPY W/ URETERAL STENT PLACEMENT Right 08/21/2017   Procedure: CYSTOSCOPY WITH RETROGRADE PYELOGRAM, RIGHT;  Surgeon: MAlexis Frock MD;  Location: [facility];  Service: Urology;  Laterality: Right;  . HERNIA REPAIR  1988   Dr. FVida Rigger . IR FLUORO GUIDE PORT INSERTION RIGHT  07/26/2017  . IR NEPHROSTOMY PLACEMENT RIGHT  08/21/2017  . IR [place]IDE VASC ACCESS RIGHT  07/26/2017    Patient Care Team: HScot Jun [name] as PCP - General (Family Medicine)  Social History   Social History  . Marital status: Single    Spouse name: N/A  .  Number of children: 0  . Years of education: N/A   Occupational History  . Not on file.   Social History Main Topics  . Smoking status: Never Smoker  . Smokeless tobacco: Never Used  . Alcohol use Yes     Comment: occasionally/socially  . Drug use: No  . Sexual activity: No   Other Topics Concern  . 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 Never used smokeless tobacco.   No e-cigarettes   Occasional alcohol  consumption socially.   Single, no children.     reports that he has never smoked. He has never used smokeless tobacco. He reports that he drinks alcohol. He reports that he does not use drugs.  Family History  Problem Relation Age of Onset  . Cancer Father    Family Status  Relation Status  . Mother Deceased  . Father Deceased     There is no immunization history on file for this patient.  Allergies  Allergen Reactions  . Bee Venom Swelling    Medications: Patient's Medications  New Prescriptions   No medications on file  Previous Medications   CALCIUM-VITAMIN D (OSCAL WITH D) 500-200 MG-UNIT TABLET    Take 1 tablet by mouth 2 (two) times daily.   CHLORHEXIDINE (PERIDEX) 0.12 % SOLUTION    15 mLs by Mouth Rinse route 2 (two) times daily.   DRONABINOL (MARINOL) 5 MG CAPSULE    Take 1 capsule (5 mg total) by mouth daily before lunch.   ENOXAPARIN (LOVENOX) 40 MG/0.4ML INJECTION    Inject 0.4 mLs (40 mg total) into the skin daily.   LIDOCAINE-PRILOCAINE, BULK, 2.5-2.5 % CREA    Apply to affected areas topically one time daily   LORAZEPAM (ATIVAN) 0.5 MG TABLET    Take 1 tablet (0.5 mg total) by mouth every 6 (six) hours as needed (Nausea or vomiting).   MAGNESIUM OXIDE (MAG-OX) 400 (241.3 MG) MG TABLET    Take 1 tablet (400 mg total) by mouth 2 (two) times daily.   MULTIPLE VITAMIN (MULTIVITAMIN WITH MINERALS) TABS TABLET    Take 1 tablet by mouth daily.   NUTRITIONAL SUPPLEMENTS (NUTRITIONAL DRINK PO)    Take 237 mLs by mouth 2 (two) times daily.   OLANZAPINE ZYDIS (ZYPREXA) 5 MG DISINTEGRATING TABLET    Take 0.5 tablets (2.5 mg total) by mouth at bedtime.   ONDANSETRON (ZOFRAN) 8 MG TABLET    Take 1 tablet (8 mg total) by mouth every 8 (eight) hours as needed for nausea or vomiting.   OXYCODONE (OXYCONTIN) 10 MG 12 HR TABLET    Take 1 tablet (10 mg total) by mouth every 12 (twelve) hours.   OXYCODONE HCL 10 MG TABS    Take 1 tablet (10 mg total) by mouth every 4 (four) hours.    PANTOPRAZOLE (PROTONIX) 40 MG TABLET    Take 40 mg by mouth daily.    POLYETHYLENE GLYCOL (MIRALAX / GLYCOLAX) PACKET    Take 17 g by mouth daily as needed for mild constipation.   POTASSIUM & SODIUM PHOSPHATES (PHOS-NAK) 280-160-250 MG PACK    Take 1 packet by mouth 4 (four) times daily -  before meals and at bedtime.   PREDNISONE (DELTASONE) 10 MG TABLET    Take 1 tablet (10 mg total) by mouth daily with breakfast.   PROCHLORPERAZINE (COMPAZINE) 10 MG TABLET    Take 1 tablet (10 mg total)  by mouth every 6 (six) hours as needed for nausea or vomiting.  Modified Medications   No medications on file  Discontinued Medications   No medications on file    Review of Systems  Vitals:   09/27/17 0944  BP: 112/82  Pulse: (!) 102  Resp: (!) 22  Temp: 99.6 F (37.6 C)  SpO2: 100%  Weight: 144 lb 3.2 oz (65.4 kg)  Height: 6' 2"  (1.88 m)   Body mass index is 18.51 kg/m.  Physical Exam  Constitutional: He is oriented to person, place, and time. He appears well-developed.  Frail appearing in NAD, sitting in w/c, looks pale  HENT:  Mouth/Throat: Oropharynx is clear and moist.  MMM; no oral thrush  Eyes: Pupils are equal, round, and reactive to light. No scleral icterus.  Neck: Neck supple. Carotid bruit is not present. No thyromegaly present.  Cardiovascular: Regular rhythm and intact distal pulses.  Tachycardia present.  Exam reveals no gallop and no friction rub.   Murmur heard.  Systolic murmur is present with a grade of 1/6  No distal LE edema. No calf TTP  Pulmonary/Chest: Effort normal and breath sounds normal. He has no wheezes. He has no rales. He exhibits no tenderness.  Abdominal: Soft. Normal appearance and bowel sounds are normal. He exhibits no distension, no abdominal bruit, no pulsatile midline mass and no mass. There is no hepatomegaly. There is no tenderness. There is no rigidity, no rebound and no guarding. No hernia.  Musculoskeletal: He exhibits tenderness.  Hard  back brace intact  Lymphadenopathy:    He has no cervical adenopathy.  Neurological: He is alert and oriented to person, place, and time.  Skin: Skin is warm and dry. No rash noted.  Psychiatric: He has a normal mood and affect. His behavior is normal. Judgment and thought content normal.     Labs reviewed: Admission on 09/17/2017, Discharged on 09/25/2017  No results displayed because visit has over 200 results.    Appointment on 09/17/2017  Component Date Value Ref Range Status  . WBC 09/17/2017 10.4* 4.0 - 10.3 10e3/uL Final  . NEUT# 09/17/2017 7.9* 1.5 - 6.5 10e3/uL Final  . HGB 09/17/2017 11.6* 13.0 - 17.1 g/dL Final  . HCT 09/17/2017 35.4* 38.4 - 49.9 % Final  . Platelets 09/17/2017 453* 140 - 400 10e3/uL Final  . MCV 09/17/2017 75.0* 79.3 - 98.0 fL Final  . MCH 09/17/2017 24.6* 27.2 - 33.4 pg Final  . MCHC 09/17/2017 32.8  32.0 - 36.0 g/dL Final  . RBC 09/17/2017 4.72  4.20 - 5.82 10e6/uL Final  . RDW 09/17/2017 19.8* 11.0 - 14.6 % Final  . lymph# 09/17/2017 1.3  0.9 - 3.3 10e3/uL Final  . MONO# 09/17/2017 1.1* 0.1 - 0.9 10e3/uL Final  . Eosinophils Absolute 09/17/2017 0.1  0.0 - 0.5 10e3/uL Final  . Basophils Absolute 09/17/2017 0.0  0.0 - 0.1 10e3/uL Final  . NEUT% 09/17/2017 75.8* 39.0 - 75.0 % Final  . LYMPH% 09/17/2017 12.8* 14.0 - 49.0 % Final  . MONO% 09/17/2017 10.5  0.0 - 14.0 % Final  . EOS% 09/17/2017 0.8  0.0 - 7.0 % Final  . BASO% 09/17/2017 0.1  0.0 - 2.0 % Final  . Sodium 09/17/2017 135* 136 - 145 mEq/L Final  . Potassium 09/17/2017 3.5  3.5 - 5.1 mEq/L Final  . Chloride 09/17/2017 98  98 - 109 mEq/L Final  . CO2 09/17/2017 19* 22 - 29 mEq/L Final  . Glucose 09/17/2017 108  70 -  140 mg/dl Final   Glucose reference range is for nonfasting patients. Fasting glucose reference range is 70- 100.  Marland Kitchen BUN 09/17/2017 7.3  7.0 - 26.0 mg/dL Final  . Creatinine 09/17/2017 0.5* 0.7 - 1.3 mg/dL Final  . Total Bilirubin 09/17/2017 1.26* 0.20 - 1.20 mg/dL Final  .  Alkaline Phosphatase 09/17/2017 146  40 - 150 U/L Final  . AST 09/17/2017 15  5 - 34 U/L Final  . ALT 09/17/2017 <6  0-55 U/L U/L Final  . Total Protein 09/17/2017 6.3* 6.4 - 8.3 g/dL Final  . Albumin 09/17/2017 2.4* 3.5 - 5.0 g/dL Final  . Calcium 09/17/2017 6.4* 8.4 - 10.4 mg/dL Final  . Anion Gap 09/17/2017 18* 3 - 11 mEq/L Final  . EGFR 09/17/2017 >60  >60 ml/min/1.73 m2 Final   eGFR is calculated using the CKD-EPI Creatinine Equation (2009)  . Uric Acid, Serum 09/17/2017 6.8  2.6 - 7.4 mg/dl Final  . Magnesium 09/17/2017 1.6  1.5 - 2.5 mg/dl Final  . Phosphorus, Ser 09/17/2017 1.9* 2.5 - 4.5 mg/dL Final  . Hep B Core Ab, IgM 09/17/2017 Negative  Negative Final  . HBV DNA SERPL PCR-ACNC 09/17/2017 HBV DNA not detected  IU/mL Final  . HBV DNA SERPL PCR-LOG IU 09/17/2017 Test Not Performed.  log10 IU/mL Final   Comment: Unable to calculate result since non-numeric result obtained for component test.   . Test Information: 09/17/2017 Comment   Final   The reportable range for this assay is 10 IU/mL to 1 billion IU/mL.  Appointment on 09/06/2017  Component Date Value Ref Range Status  . WBC 09/06/2017 7.4  4.0 - 10.3 10e3/uL Final  . NEUT# 09/06/2017 5.7  1.5 - 6.5 10e3/uL Final  . HGB 09/06/2017 10.4* 13.0 - 17.1 g/dL Final  . HCT 09/06/2017 30.8* 38.4 - 49.9 % Final  . Platelets 09/06/2017 543* 140 - 400 10e3/uL Final  . MCV 09/06/2017 72.8* 79.3 - 98.0 fL Final  . MCH 09/06/2017 24.6* 27.2 - 33.4 pg Final  . MCHC 09/06/2017 33.8  32.0 - 36.0 g/dL Final  . RBC 09/06/2017 4.23  4.20 - 5.82 10e6/uL Final  . RDW 09/06/2017 19.6* 11.0 - 14.6 % Final  . lymph# 09/06/2017 0.2* 0.9 - 3.3 10e3/uL Final  . MONO# 09/06/2017 1.3* 0.1 - 0.9 10e3/uL Final  . Eosinophils Absolute 09/06/2017 0.0  0.0 - 0.5 10e3/uL Final  . Basophils Absolute 09/06/2017 0.1  0.0 - 0.1 10e3/uL Final  . NEUT% 09/06/2017 77.8* 39.0 - 75.0 % Final  . LYMPH% 09/06/2017 2.9* 14.0 - 49.0 % Final  . MONO% 09/06/2017  17.2* 0.0 - 14.0 % Final  . EOS% 09/06/2017 0.1  0.0 - 7.0 % Final  . BASO% 09/06/2017 2.0  0.0 - 2.0 % Final  . Sodium 09/06/2017 136  136 - 145 mEq/L Final  . Potassium 09/06/2017 2.8* 3.5 - 5.1 mEq/L Final  . Chloride 09/06/2017 100  98 - 109 mEq/L Final  . CO2 09/06/2017 21* 22 - 29 mEq/L Final  . Glucose 09/06/2017 99  70 - 140 mg/dl Final   Glucose reference range is for nonfasting patients. Fasting glucose reference range is 70- 100.  Marland Kitchen BUN 09/06/2017 11.1  7.0 - 26.0 mg/dL Final  . Creatinine 09/06/2017 0.5* 0.7 - 1.3 mg/dL Final  . Total Bilirubin 09/06/2017 1.14  0.20 - 1.20 mg/dL Final  . Alkaline Phosphatase 09/06/2017 141  40 - 150 U/L Final  . AST 09/06/2017 15  5 - 34 U/L Final  .  ALT 09/06/2017 10  0 - 55 U/L Final  . Total Protein 09/06/2017 6.4  6.4 - 8.3 g/dL Final  . Albumin 09/06/2017 2.5* 3.5 - 5.0 g/dL Final  . Calcium 09/06/2017 7.1* 8.4 - 10.4 mg/dL Final  . Anion Gap 09/06/2017 15* 3 - 11 mEq/L Final  . EGFR 09/06/2017 >90  >90 ml/min/1.73 m2 Final   eGFR is calculated using the CKD-EPI Creatinine Equation (2009)  . LDH 09/06/2017 293* 125 - 245 U/L Final  . Uric Acid, Serum 09/06/2017 4.8  2.6 - 7.4 mg/dl Final  . Magnesium 09/06/2017 1.8  1.5 - 2.5 mg/dl Final  . Phosphorus, Ser 09/06/2017 1.4* 2.5 - 4.5 mg/dL Final   **Verified by repeat analysis**  . Hep B Core Ab, IgM 09/06/2017 Negative  Negative Final  . HBsAg Screen 09/06/2017 Negative  Negative Final  . Hep B E Ag 09/06/2017 Negative  Negative Final  Admission on 08/21/2017, Discharged on 09/03/2017  No results displayed because visit has over 200 results.    Appointment on 08/16/2017  Component Date Value Ref Range Status  . WBC 08/16/2017 2.7* 4.0 - 10.3 10e3/uL Final  . NEUT# 08/16/2017 2.6  1.5 - 6.5 10e3/uL Final  . HGB 08/16/2017 11.0* 13.0 - 17.1 g/dL Final  . HCT 08/16/2017 32.3* 38.4 - 49.9 % Final  . Platelets 08/16/2017 158  140 - 400 10e3/uL Final  . MCV 08/16/2017 73.9* 79.3 -  98.0 fL Final  . MCH 08/16/2017 25.1* 27.2 - 33.4 pg Final  . MCHC 08/16/2017 34.0  32.0 - 36.0 g/dL Final  . RBC 08/16/2017 4.37  4.20 - 5.82 10e6/uL Final  . RDW 08/16/2017 17.7* 11.0 - 14.6 % Final  . lymph# 08/16/2017 0.1* 0.9 - 3.3 10e3/uL Final  . MONO# 08/16/2017 0.0* 0.1 - 0.9 10e3/uL Final  . Eosinophils Absolute 08/16/2017 0.0  0.0 - 0.5 10e3/uL Final  . Basophils Absolute 08/16/2017 0.0  0.0 - 0.1 10e3/uL Final  . NEUT% 08/16/2017 93.9* 39.0 - 75.0 % Final  . LYMPH% 08/16/2017 3.8* 14.0 - 49.0 % Final  . MONO% 08/16/2017 0.5  0.0 - 14.0 % Final  . EOS% 08/16/2017 1.6  0.0 - 7.0 % Final  . BASO% 08/16/2017 0.2  0.0 - 2.0 % Final  . Sodium 08/16/2017 135* 136 - 145 mEq/L Final  . Potassium 08/16/2017 3.6  3.5 - 5.1 mEq/L Final  . Chloride 08/16/2017 101  98 - 109 mEq/L Final  . CO2 08/16/2017 25  22 - 29 mEq/L Final  . Glucose 08/16/2017 83  70 - 140 mg/dl Final   Glucose reference range is for nonfasting patients. Fasting glucose reference range is 70- 100.  Marland Kitchen BUN 08/16/2017 10.4  7.0 - 26.0 mg/dL Final  . Creatinine 08/16/2017 0.6* 0.7 - 1.3 mg/dL Final  . Total Bilirubin 08/16/2017 0.89  0.20 - 1.20 mg/dL Final  . Alkaline Phosphatase 08/16/2017 110  40 - 150 U/L Final  . AST 08/16/2017 27  5 - 34 U/L Final  . ALT 08/16/2017 59* 0 - 55 U/L Final  . Total Protein 08/16/2017 6.4  6.4 - 8.3 g/dL Final  . Albumin 08/16/2017 3.4* 3.5 - 5.0 g/dL Final  . Calcium 08/16/2017 8.1* 8.4 - 10.4 mg/dL Final  . Anion Gap 08/16/2017 8  3 - 11 mEq/L Final  . EGFR 08/16/2017 >90  >90 ml/min/1.73 m2 Final   eGFR is calculated using the CKD-EPI Creatinine Equation (2009)  . Uric Acid, Serum 08/16/2017 <2.0* 2.6 - 7.4 mg/dl  Final  . Phosphorus, Ser 08/16/2017 1.2* 2.5 - 4.5 mg/dL Final   **Verified by repeat analysis**  . Magnesium 08/16/2017 2.3  1.5 - 2.5 mg/dl Final  Appointment on 08/08/2017  Component Date Value Ref Range Status  . WBC 08/08/2017 4.9  4.0 - 10.3 10e3/uL Final  .  NEUT# 08/08/2017 3.6  1.5 - 6.5 10e3/uL Final  . HGB 08/08/2017 12.2* 13.0 - 17.1 g/dL Final  . HCT 08/08/2017 36.6* 38.4 - 49.9 % Final  . Platelets 08/08/2017 255  140 - 400 10e3/uL Final  . MCV 08/08/2017 76.3* 79.3 - 98.0 fL Final  . MCH 08/08/2017 25.4* 27.2 - 33.4 pg Final  . MCHC 08/08/2017 33.3  32.0 - 36.0 g/dL Final  . RBC 08/08/2017 4.80  4.20 - 5.82 10e6/uL Final  . RDW 08/08/2017 16.3* 11.0 - 14.6 % Final  . lymph# 08/08/2017 1.0  0.9 - 3.3 10e3/uL Final  . MONO# 08/08/2017 0.3  0.1 - 0.9 10e3/uL Final  . Eosinophils Absolute 08/08/2017 0.0  0.0 - 0.5 10e3/uL Final  . Basophils Absolute 08/08/2017 0.0  0.0 - 0.1 10e3/uL Final  . NEUT% 08/08/2017 73.0  39.0 - 75.0 % Final  . LYMPH% 08/08/2017 20.6  14.0 - 49.0 % Final  . MONO% 08/08/2017 6.0  0.0 - 14.0 % Final  . EOS% 08/08/2017 0.4  0.0 - 7.0 % Final  . BASO% 08/08/2017 0.0  0.0 - 2.0 % Final  . Sodium 08/08/2017 137  136 - 145 mEq/L Final  . Potassium 08/08/2017 3.5  3.5 - 5.1 mEq/L Final  . Chloride 08/08/2017 100  98 - 109 mEq/L Final  . CO2 08/08/2017 27  22 - 29 mEq/L Final  . Glucose 08/08/2017 101  70 - 140 mg/dl Final   Glucose reference range is for nonfasting patients. Fasting glucose reference range is 70- 100.  Marland Kitchen BUN 08/08/2017 12.3  7.0 - 26.0 mg/dL Final  . Creatinine 08/08/2017 0.8  0.7 - 1.3 mg/dL Final  . Total Bilirubin 08/08/2017 0.85  0.20 - 1.20 mg/dL Final  . Alkaline Phosphatase 08/08/2017 106  40 - 150 U/L Final  . AST 08/08/2017 22  5 - 34 U/L Final  . ALT 08/08/2017 22  0 - 55 U/L Final  . Total Protein 08/08/2017 6.9  6.4 - 8.3 g/dL Final  . Albumin 08/08/2017 3.4* 3.5 - 5.0 g/dL Final  . Calcium 08/08/2017 10.6* 8.4 - 10.4 mg/dL Final  . Anion Gap 08/08/2017 10  3 - 11 mEq/L Final  . EGFR 08/08/2017 >90  >90 ml/min/1.73 m2 Final   eGFR is calculated using the CKD-EPI Creatinine Equation (2009)  . Uric Acid, Serum 08/08/2017 3.7  2.6 - 7.4 mg/dl Final  . Magnesium 08/08/2017 2.1  1.5 - 2.5  mg/dl Final  . Phosphorus, Ser 08/08/2017 2.8  2.5 - 4.5 mg/dL Final  . Hep B E Ag 08/08/2017 Negative  Negative Final  . HBV DNA SERPL PCR-ACNC 08/08/2017 HBV DNA not detected  IU/mL Final  . HBV DNA SERPL PCR-LOG IU 08/08/2017 Test Not Performed.  log10 IU/mL Final   Comment: Unable to calculate result since non-numeric result obtained for component test.   . Test Information: 08/08/2017 Comment   Final   The reportable range for this assay is 10 IU/mL to 1 billion IU/mL.  Marland Kitchen Hep B Core Ab, IgM 08/08/2017 Negative  Negative Final  . LDH 08/08/2017 309* 125 - 245 U/L Final  . Technologist Review 08/08/2017 Rare meta   Final  Hospital Outpatient Visit on 07/26/2017  Component Date Value Ref Range Status  . aPTT 07/26/2017 29  24 - 36 seconds Final  . Sodium 07/26/2017 130* 135 - 145 mmol/L Final  . Potassium 07/26/2017 3.5  3.5 - 5.1 mmol/L Final  . Chloride 07/26/2017 94* 101 - 111 mmol/L Final  . CO2 07/26/2017 25  22 - 32 mmol/L Final  . Glucose, Bld 07/26/2017 92  65 - 99 mg/dL Final  . BUN 07/26/2017 18  6 - 20 mg/dL Final  . Creatinine, Ser 07/26/2017 0.68  0.61 - 1.24 mg/dL Final  . Calcium 07/26/2017 9.4  8.9 - 10.3 mg/dL Final  . GFR calc non Af Amer 07/26/2017 >60  >60 mL/min Final  . GFR calc Af Amer 07/26/2017 >60  >60 mL/min Final   Comment: (NOTE) The eGFR has been calculated using the CKD EPI equation. This calculation has not been validated in all clinical situations. eGFR's persistently <60 mL/min signify possible Chronic Kidney Disease.   . Anion gap 07/26/2017 11  5 - 15 Final  . WBC 07/26/2017 5.2  4.0 - 10.5 K/uL Final  . RBC 07/26/2017 4.87  4.22 - 5.81 MIL/uL Final  . Hemoglobin 07/26/2017 12.3* 13.0 - 17.0 g/dL Final  . HCT 07/26/2017 36.1* 39.0 - 52.0 % Final  . MCV 07/26/2017 74.1* 78.0 - 100.0 fL Final  . MCH 07/26/2017 25.3* 26.0 - 34.0 pg Final  . MCHC 07/26/2017 34.1  30.0 - 36.0 g/dL Final  . RDW 07/26/2017 15.6* 11.5 - 15.5 % Final  . Platelets  07/26/2017 179  150 - 400 K/uL Final  . Prothrombin Time 07/26/2017 14.5  11.4 - 15.2 seconds Final  . INR 07/26/2017 1.13   Final  Appointment on 07/16/2017  Component Date Value Ref Range Status  . WBC 07/16/2017 7.0  4.0 - 10.3 10e3/uL Final  . NEUT# 07/16/2017 6.1  1.5 - 6.5 10e3/uL Final  . HGB 07/16/2017 13.1  13.0 - 17.1 g/dL Final  . HCT 07/16/2017 38.4  38.4 - 49.9 % Final  . Platelets 07/16/2017 173  140 - 400 10e3/uL Final  . MCV 07/16/2017 75.9* 79.3 - 98.0 fL Final  . MCH 07/16/2017 25.9* 27.2 - 33.4 pg Final  . MCHC 07/16/2017 34.1  32.0 - 36.0 g/dL Final  . RBC 07/16/2017 5.06  4.20 - 5.82 10e6/uL Final  . RDW 07/16/2017 15.5* 11.0 - 14.6 % Final  . lymph# 07/16/2017 0.6* 0.9 - 3.3 10e3/uL Final  . MONO# 07/16/2017 0.3  0.1 - 0.9 10e3/uL Final  . Eosinophils Absolute 07/16/2017 0.0  0.0 - 0.5 10e3/uL Final  . Basophils Absolute 07/16/2017 0.0  0.0 - 0.1 10e3/uL Final  . NEUT% 07/16/2017 86.8* 39.0 - 75.0 % Final  . LYMPH% 07/16/2017 8.2* 14.0 - 49.0 % Final  . MONO% 07/16/2017 4.4  0.0 - 14.0 % Final  . EOS% 07/16/2017 0.6  0.0 - 7.0 % Final  . BASO% 07/16/2017 0.0  0.0 - 2.0 % Final  . Sodium 07/16/2017 132* 136 - 145 mEq/L Final  . Potassium 07/16/2017 3.9  3.5 - 5.1 mEq/L Final  . Chloride 07/16/2017 96* 98 - 109 mEq/L Final  . CO2 07/16/2017 27  22 - 29 mEq/L Final  . Glucose 07/16/2017 95  70 - 140 mg/dl Final   Glucose reference range is for nonfasting patients. Fasting glucose reference range is 70- 100.  Marland Kitchen BUN 07/16/2017 25.2  7.0 - 26.0 mg/dL Final  . Creatinine 07/16/2017 0.9  0.7 -  1.3 mg/dL Final  . Total Bilirubin 07/16/2017 1.27* 0.20 - 1.20 mg/dL Final  . Alkaline Phosphatase 07/16/2017 173* 40 - 150 U/L Final  . AST 07/16/2017 44* 5 - 34 U/L Final  . ALT 07/16/2017 148* 0 - 55 U/L Final  . Total Protein 07/16/2017 7.1  6.4 - 8.3 g/dL Final  . Albumin 07/16/2017 3.7  3.5 - 5.0 g/dL Final  . Calcium 07/16/2017 10.0  8.4 - 10.4 mg/dL Final  . Anion  Gap 07/16/2017 9  3 - 11 mEq/L Final  . EGFR 07/16/2017 >90  >90 ml/min/1.73 m2 Final   eGFR is calculated using the CKD-EPI Creatinine Equation (2009)  . LDH 07/16/2017 193  125 - 245 U/L Final  . Uric Acid, Serum 07/16/2017 6.1  2.6 - 7.4 mg/dl Final  . Magnesium 07/16/2017 2.2  1.5 - 2.5 mg/dl Final  . Phosphorus, Ser 07/16/2017 2.8  2.5 - 4.5 mg/dL Final  . Hep C Virus Ab 07/16/2017 0.1  0.0 - 0.9 s/co ratio Final   Comment:                                                  Negative:     < 0.8                                             Indeterminate: 0.8 - 0.9                                                  Positive:     > 0.9                 The CDC recommends that a positive HCV antibody result                 be followed up with a HCV Nucleic Acid Amplification                 test (161096).   . Hep B Surface Ab, Qual 07/16/2017 Reactive   Final   Comment:                              Non Reactive: Inconsistent with immunity,                                            less than 10 mIU/mL                              Reactive:     Consistent with immunity,                                            greater than 9.9 mIU/mL   . HBsAg Screen 07/16/2017 Negative  Negative Final  . Hep  B Core Ab, Tot 07/16/2017 Positive* Negative Final  Office Visit on 07/06/2017  Component Date Value Ref Range Status  . Glucose, UA 07/06/2017 NEGATIVE  NEGATIVE mg/dL Final  . Bilirubin Urine 07/06/2017 SMALL* NEGATIVE Final  . Ketones, ur 07/06/2017 15* NEGATIVE mg/dL Final  . Specific Gravity, Urine 07/06/2017 1.025  1.005 - 1.030 Final  . Hgb urine dipstick 07/06/2017 SMALL* NEGATIVE Final  . pH 07/06/2017 5.5  5.0 - 8.0 Final  . Protein, ur 07/06/2017 NEGATIVE  NEGATIVE mg/dL Final  . Urobilinogen, UA 07/06/2017 1.0  0.0 - 1.0 mg/dL Final  . Nitrite 07/06/2017 NEGATIVE  NEGATIVE Final  . Leukocytes, UA 07/06/2017 NEGATIVE  NEGATIVE Final   Biochemical Testing Only. Please order routine  urinalysis from main lab if confirmatory testing is needed.  Admission on 06/28/2017, Discharged on 07/03/2017  Component Date Value Ref Range Status  . Sodium 06/28/2017 138  135 - 145 mmol/L Final  . Potassium 06/28/2017 4.1  3.5 - 5.1 mmol/L Final  . Chloride 06/28/2017 104  101 - 111 mmol/L Final  . CO2 06/28/2017 23  22 - 32 mmol/L Final  . Glucose, Bld 06/28/2017 108* 65 - 99 mg/dL Final  . BUN 06/28/2017 14  6 - 20 mg/dL Final  . Creatinine, Ser 06/28/2017 0.84  0.61 - 1.24 mg/dL Final  . Calcium 06/28/2017 9.1  8.9 - 10.3 mg/dL Final  . Total Protein 06/28/2017 7.6  6.5 - 8.1 g/dL Final  . Albumin 06/28/2017 3.9  3.5 - 5.0 g/dL Final  . AST 06/28/2017 45* 15 - 41 U/L Final  . ALT 06/28/2017 30  17 - 63 U/L Final  . Alkaline Phosphatase 06/28/2017 101  38 - 126 U/L Final  . Total Bilirubin 06/28/2017 1.6* 0.3 - 1.2 mg/dL Final  . GFR calc non Af Amer 06/28/2017 >60  >60 mL/min Final  . GFR calc Af Amer 06/28/2017 >60  >60 mL/min Final   Comment: (NOTE) The eGFR has been calculated using the CKD EPI equation. This calculation has not been validated in all clinical situations. eGFR's persistently <60 mL/min signify possible Chronic Kidney Disease.   . Anion gap 06/28/2017 11  5 - 15 Final  . WBC 06/28/2017 7.8  4.0 - 10.5 K/uL Final  . RBC 06/28/2017 5.42  4.22 - 5.81 MIL/uL Final  . Hemoglobin 06/28/2017 14.1  13.0 - 17.0 g/dL Final  . HCT 06/28/2017 40.7  39.0 - 52.0 % Final  . MCV 06/28/2017 75.1* 78.0 - 100.0 fL Final  . MCH 06/28/2017 26.0  26.0 - 34.0 pg Final  . MCHC 06/28/2017 34.6  30.0 - 36.0 g/dL Final  . RDW 06/28/2017 14.7  11.5 - 15.5 % Final  . Platelets 06/28/2017 332  150 - 400 K/uL Final  . Neutrophils Relative % 06/28/2017 73  % Final  . Neutro Abs 06/28/2017 5.7  1.7 - 7.7 K/uL Final  . Lymphocytes Relative 06/28/2017 13  % Final  . Lymphs Abs 06/28/2017 1.0  0.7 - 4.0 K/uL Final  . Monocytes Relative 06/28/2017 13  % Final  . Monocytes Absolute  06/28/2017 1.0  0.1 - 1.0 K/uL Final  . Eosinophils Relative 06/28/2017 1  % Final  . Eosinophils Absolute 06/28/2017 0.1  0.0 - 0.7 K/uL Final  . Basophils Relative 06/28/2017 0  % Final  . Basophils Absolute 06/28/2017 0.0  0.0 - 0.1 K/uL Final  . Color, Urine 06/28/2017 YELLOW  YELLOW Final  . APPearance 06/28/2017 CLEAR  CLEAR Final  . Specific  Gravity, Urine 06/28/2017 1.020  1.005 - 1.030 Final  . pH 06/28/2017 5.0  5.0 - 8.0 Final  . Glucose, UA 06/28/2017 NEGATIVE  NEGATIVE mg/dL Final  . Hgb urine dipstick 06/28/2017 SMALL* NEGATIVE Final  . Bilirubin Urine 06/28/2017 NEGATIVE  NEGATIVE Final  . Ketones, ur 06/28/2017 NEGATIVE  NEGATIVE mg/dL Final  . Protein, ur 06/28/2017 NEGATIVE  NEGATIVE mg/dL Final  . Nitrite 06/28/2017 NEGATIVE  NEGATIVE Final  . Leukocytes, UA 06/28/2017 NEGATIVE  NEGATIVE Final  . RBC / HPF 06/28/2017 6-30  0 - 5 RBC/hpf Final  . WBC, UA 06/28/2017 6-30  0 - 5 WBC/hpf Final  . Bacteria, UA 06/28/2017 RARE* NONE SEEN Final  . Squamous Epithelial / LPF 06/28/2017 0-5* NONE SEEN Final  . Mucus 06/28/2017 PRESENT   Final  . CEA 06/28/2017 0.7  0.0 - 4.7 ng/mL Final   Comment: (NOTE)       Roche ECLIA methodology       Nonsmokers  <3.9                                     Smokers     <5.6 Performed At: [facility] [place], [place] [id number] Lindon Romp MD PT:[id number]   . LDH 06/28/2017 461* 98 - 192 U/L Final  . CA 19-9 06/28/2017 23  0 - 35 U/mL Final   Comment: (NOTE) Roche ECLIA methodology Performed At: [facility] [place], [place] [id number] Lindon Romp MD HQ:[id number]   . Beta-2 Microglobulin 06/28/2017 2.2  0.6 - 2.4 mg/L Final   Comment: (NOTE) Siemens Immulite 2000 Immunochemiluminometric assay [facility]) Performed At: [facility] [place], [place] [id number] Lindon Romp MD BL:[id number]   . HIV Screen 4th Generation wRfx 06/28/2017 Non Reactive  Non  Reactive Final   Comment: (NOTE) Performed At: [facility] [place], [place] [id number] Lindon Romp MD HL:[id number]   . Sodium 06/29/2017 139  135 - 145 mmol/L Final  . Potassium 06/29/2017 4.1  3.5 - 5.1 mmol/L Final  . Chloride 06/29/2017 103  101 - 111 mmol/L Final  . CO2 06/29/2017 27  22 - 32 mmol/L Final  . Glucose, Bld 06/29/2017 130* 65 - 99 mg/dL Final  . BUN 06/29/2017 20  6 - 20 mg/dL Final  . Creatinine, Ser 06/29/2017 0.70  0.61 - 1.24 mg/dL Final  . Calcium 06/29/2017 10.0  8.9 - 10.3 mg/dL Final  . GFR calc non Af Amer 06/29/2017 >60  >60 mL/min Final  . GFR calc Af Amer 06/29/2017 >60  >60 mL/min Final   Comment: (NOTE) The eGFR has been calculated using the CKD EPI equation. This calculation has not been validated in all clinical situations. eGFR's persistently <60 mL/min signify possible Chronic Kidney Disease.   . Anion gap 06/29/2017 9  5 - 15 Final  . WBC 06/29/2017 6.7  4.0 - 10.5 K/uL Final  . RBC 06/29/2017 5.38  4.22 - 5.81 MIL/uL Final  . Hemoglobin 06/29/2017 13.7  13.0 - 17.0 g/dL Final  . HCT 06/29/2017 40.4  39.0 - 52.0 % Final  . MCV 06/29/2017 75.1* 78.0 - 100.0 fL Final  . MCH 06/29/2017 25.5* 26.0 - 34.0 pg Final  . MCHC 06/29/2017 33.9  30.0 - 36.0 g/dL Final  . RDW 06/29/2017 15.1  11.5 - 15.5 % Final  . Platelets 06/29/2017 354  150 - 400 K/uL Final  . Prostatic Specific Antigen 06/29/2017 3.23  0.00 - 4.00 ng/mL Final   Comment: (NOTE) While PSA levels of <=4.0 ng/ml are reported as reference range, some men with levels below 4.0 ng/ml can have prostate cancer and many men with PSA above 4.0 ng/ml do not have prostate cancer.  Other tests such as free PSA, age specific reference ranges, PSA velocity and PSA doubling time may be helpful especially in men less than 3 years old. Performed at [facility], [place] [address] [place], [place] [address]   . Prothrombin Time 06/29/2017 13.8  11.4 - 15.2 seconds  Final  . INR 06/29/2017 1.06   Final  There may be more visits with results that are not included.    Dg Fluoro Guided Loc Of Needle/cath Tip For Spinal Inject Lt  Result Date: 09/18/2017 CLINICAL DATA:  B-cell lymphoma.  For intrathecal chemotherapy EXAM: DIAGNOSTIC LUMBAR PUNCTURE UNDER FLUOROSCOPIC GUIDANCE FLUOROSCOPY TIME:  Fluoroscopy Time:  0 minutes 16 second Radiation Exposure Index (if provided by the fluoroscopic device): Number of Acquired Spot Images: 0 PROCEDURE: Informed consent was obtained from the patient prior to the procedure, including potential complications of headache, allergy, and pain. With the patient prone, the lower back was prepped with Betadine. 1% Lidocaine was used for local anesthesia. Lumbar puncture was performed at the L4-5 level using a 20 gauge needle with return of slightly blood-tinged CSF which cleared, with an opening pressure of 9 cm water. 13.5 ml of CSF were obtained for laboratory studies. 12 mg of methotrexate was injected into the subarachnoid space. The patient tolerated the procedure well and there were no apparent complications. IMPRESSION: Successful lumbar puncture and injection of intrathecal methotrexate Electronically Signed   By: Franchot Gallo M.D.   On: 09/18/2017 13:24     Assessment/Plan   ICD-10-CM   1. Drug-induced constipation K59.03   2. Electrolyte disturbance E87.8   3. 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 Bony metastasis ([place]) C79.51   5. Severe protein-calorie malnutrition ([place]) E43   6. Adjustment disorder with mixed anxiety and depressed mood F43.23   7. Gastroesophageal reflux disease without esophagitis K21.9     Change miralax to daily for constipation. May need t/c movantik or symproic if not responsive to miralax and senna  Add Senna s take 1 tab po daily prn mod-sev constipation  Cont other meds as ordered. Pain control as ordered. lovenox for DVT  prophylaxis  PT/OT/ST as ordered  F/u with oncology as scheduled. He will get repeat PET scan and chemotx  Cont nutritional supplements as ordered  GOAL: short term rehab and d/c home when medically appropriate. Communicated with pt and nursing.  Will follow   Aquilla Voiles S. Perlie Gold  [facility] [address] [place], [place] [address] [phone] Cell (Monday-Friday 8 AM - 5 PM) [phone] After 5 PM and follow prompts

## 2017-10-01 ENCOUNTER — Ambulatory Visit (HOSPITAL_BASED_OUTPATIENT_CLINIC_OR_DEPARTMENT_OTHER): Payer: Medicaid Other

## 2017-10-01 ENCOUNTER — Other Ambulatory Visit (HOSPITAL_BASED_OUTPATIENT_CLINIC_OR_DEPARTMENT_OTHER): Payer: Medicaid Other

## 2017-10-01 ENCOUNTER — Ambulatory Visit (HOSPITAL_BASED_OUTPATIENT_CLINIC_OR_DEPARTMENT_OTHER): Payer: Medicaid Other | Admitting: Hematology and Oncology

## 2017-10-01 ENCOUNTER — Telehealth: Payer: Self-pay

## 2017-10-01 VITALS — BP 113/62 | HR 125 | Temp 98.2°F | Resp 17 | Ht 74.0 in

## 2017-10-01 DIAGNOSIS — C7951 Secondary malignant neoplasm of bone: Secondary | ICD-10-CM

## 2017-10-01 DIAGNOSIS — C8338 Diffuse large B-cell lymphoma, lymph nodes of multiple sites: Secondary | ICD-10-CM | POA: Diagnosis present

## 2017-10-01 DIAGNOSIS — E878 Other disorders of electrolyte and fluid balance, not elsewhere classified: Secondary | ICD-10-CM | POA: Diagnosis not present

## 2017-10-01 LAB — CBC WITH DIFFERENTIAL/PLATELET
BASO%: 0.5 % (ref 0.0–2.0)
BASOS ABS: 0 10*3/uL (ref 0.0–0.1)
EOS ABS: 0 10*3/uL (ref 0.0–0.5)
EOS%: 0.2 % (ref 0.0–7.0)
HCT: 30.7 % — ABNORMAL LOW (ref 38.4–49.9)
HGB: 9.9 g/dL — ABNORMAL LOW (ref 13.0–17.1)
LYMPH%: 5.3 % — AB (ref 14.0–49.0)
MCH: 25 pg — ABNORMAL LOW (ref 27.2–33.4)
MCHC: 32.3 g/dL (ref 32.0–36.0)
MCV: 77.5 fL — AB (ref 79.3–98.0)
MONO#: 0.5 10*3/uL (ref 0.1–0.9)
MONO%: 39.1 % — ABNORMAL HIGH (ref 0.0–14.0)
NEUT#: 0.7 10*3/uL — ABNORMAL LOW (ref 1.5–6.5)
NEUT%: 54.9 % (ref 39.0–75.0)
PLATELETS: 366 10*3/uL (ref 140–400)
RBC: 3.96 10*6/uL — AB (ref 4.20–5.82)
RDW: 22.7 % — ABNORMAL HIGH (ref 11.0–14.6)
WBC: 1.3 10*3/uL — ABNORMAL LOW (ref 4.0–10.3)
lymph#: 0.1 10*3/uL — ABNORMAL LOW (ref 0.9–3.3)

## 2017-10-01 LAB — LACTATE DEHYDROGENASE: LDH: 162 U/L (ref 125–245)

## 2017-10-01 LAB — COMPREHENSIVE METABOLIC PANEL
ALT: 48 U/L (ref 0–55)
ANION GAP: 15 meq/L — AB (ref 3–11)
AST: 29 U/L (ref 5–34)
Albumin: 2.6 g/dL — ABNORMAL LOW (ref 3.5–5.0)
Alkaline Phosphatase: 186 U/L — ABNORMAL HIGH (ref 40–150)
BILIRUBIN TOTAL: 1.19 mg/dL (ref 0.20–1.20)
BUN: 10.9 mg/dL (ref 7.0–26.0)
CO2: 21 meq/L — AB (ref 22–29)
Calcium: 7.7 mg/dL — ABNORMAL LOW (ref 8.4–10.4)
Chloride: 102 mEq/L (ref 98–109)
Creatinine: 0.5 mg/dL — ABNORMAL LOW (ref 0.7–1.3)
Glucose: 104 mg/dl (ref 70–140)
Potassium: 3.8 mEq/L (ref 3.5–5.1)
Sodium: 138 mEq/L (ref 136–145)
TOTAL PROTEIN: 6.2 g/dL — AB (ref 6.4–8.3)

## 2017-10-01 LAB — MAGNESIUM: MAGNESIUM: 2 mg/dL (ref 1.5–2.5)

## 2017-10-01 MED ORDER — SODIUM CHLORIDE 0.9 % IV SOLN: INTRAVENOUS | Status: DC

## 2017-10-01 MED ORDER — HEPARIN SOD (PORK) LOCK FLUSH 100 UNIT/ML IV SOLN
500.0000 [IU] | Freq: Once | INTRAVENOUS | Status: AC | PRN
  Administered 2017-10-01: 500 [IU]
  Filled 2017-10-01: qty 5

## 2017-10-01 MED ORDER — PREDNISONE 10 MG PO TABS: 20.0000 mg | ORAL_TABLET | Freq: Every day | ORAL | 0 refills | Status: DC

## 2017-10-01 MED ORDER — SODIUM CHLORIDE 0.9% FLUSH
10.0000 mL | INTRAVENOUS | Status: DC | PRN
  Administered 2017-10-01: 10 mL
  Filled 2017-10-01: qty 10

## 2017-10-01 MED ORDER — SULFAMETHOXAZOLE-TRIMETHOPRIM 800-160 MG PO TABS: 1.0000 | ORAL_TABLET | ORAL | 3 refills | Status: DC

## 2017-10-01 NOTE — Telephone Encounter (Signed)
High priority scheduling message sent about pt.'s upcoming appointments per today's LOS.

## 2017-10-01 NOTE — Patient Instructions (Signed)
Patient will need IV fluids at the [facility] two times weekly. The scheduling department is arranging those and will call when appointments have been arranged.  Please follow-up with surgeon regarding continued use of back brace.  See notes/orders written by Dr. [name].  Today's labs are attached.  Dehydration, Adult Dehydration is a condition in which there is not enough fluid or water in the body. This happens when you lose more fluids than you take in. Important organs, such as the kidneys, brain, and heart, cannot function without a proper amount of fluids. Any loss of fluids from the body can lead to dehydration. Dehydration can range from mild to severe. This condition should be treated right away to prevent it from becoming severe. What are the causes? This condition may be caused by:  Vomiting.  Diarrhea.  Excessive sweating, such as from heat exposure or exercise.  Not drinking enough fluid, especially: ? When ill. ? While doing activity that requires a lot of energy.  Excessive urination.  Fever.  Infection.  Certain medicines, such as medicines that cause the body to lose excess fluid (diuretics).  Inability to access safe drinking water.  Reduced physical ability to get adequate water and food.  What increases the risk? This condition is more likely to develop in people:  Who have a poorly controlled long-term (chronic) illness, such as diabetes, heart disease, or kidney disease.  Who are age 50 or older.  Who are disabled.  Who live in a place with high altitude.  Who play endurance sports.  What are the signs or symptoms? Symptoms of mild dehydration may include:  Thirst.  Dry lips.  Slightly dry mouth.  Dry, warm skin.  Dizziness. Symptoms of moderate dehydration may include:  Very dry mouth.  Muscle cramps.  Dark urine. Urine may be the color of tea.  Decreased urine production.  Decreased tear production.  Heartbeat that is  irregular or faster than normal (palpitations).  Headache.  Light-headedness, especially when you stand up from a sitting position.  Fainting (syncope). Symptoms of severe dehydration may include:  Changes in skin, such as: ? Cold and clammy skin. ? Blotchy (mottled) or pale skin. ? Skin that does not quickly return to normal after being lightly pinched and released (poor skin turgor).  Changes in body fluids, such as: ? Extreme thirst. ? No tear production. ? Inability to sweat when body temperature is high, such as in hot weather. ? Very little urine production.  Changes in vital signs, such as: ? Weak pulse. ? Pulse that is more than 100 beats a minute when sitting still. ? Rapid breathing. ? Low blood pressure.  Other changes, such as: ? Sunken eyes. ? Cold hands and feet. ? Confusion. ? Lack of energy (lethargy). ? Difficulty waking up from sleep. ? Short-term weight loss. ? Unconsciousness. How is this diagnosed? This condition is diagnosed based on your symptoms and a physical exam. Blood and urine tests may be done to help confirm the diagnosis. How is this treated? Treatment for this condition depends on the severity. Mild or moderate dehydration can often be treated at home. Treatment should be started right away. Do not wait until dehydration becomes severe. Severe dehydration is an emergency and it needs to be treated in a hospital. Treatment for mild dehydration may include:  Drinking more fluids.  Replacing salts and minerals in your blood (electrolytes) that you may have lost. Treatment for moderate dehydration may include:  Drinking an oral rehydration solution (  ORS). This is a drink that helps you replace fluids and electrolytes (rehydrate). It can be found at pharmacies and retail stores. Treatment for severe dehydration may include:  Receiving fluids through an IV tube.  Receiving an electrolyte solution through a feeding tube that is passed  through your nose and into your stomach (nasogastric tube, or NG tube).  Correcting any abnormalities in electrolytes.  Treating the underlying cause of dehydration. Follow these instructions at home:  If directed by your health care provider, drink an ORS: ? Make an ORS by following instructions on the package. ? Start by drinking small amounts, about  cup (120 mL) every 5-10 minutes. ? Slowly increase how much you drink until you have taken the amount recommended by your health care provider.  Drink enough clear fluid to keep your urine clear or pale yellow. If you were told to drink an ORS, finish the ORS first, then start slowly drinking other clear fluids. Drink fluids such as: ? Water. Do not drink only water. Doing that can lead to having too little salt (sodium) in the body (hyponatremia). ? Ice chips. ? Fruit juice that you have added water to (diluted fruit juice). ? Low-calorie sports drinks.  Avoid: ? Alcohol. ? Drinks that contain a lot of sugar. These include high-calorie sports drinks, fruit juice that is not diluted, and soda. ? Caffeine. ? Foods that are greasy or contain a lot of fat or sugar.  Take over-the-counter and prescription medicines only as told by your health care provider.  Do not take sodium tablets. This can lead to having too much sodium in the body (hypernatremia).  Eat foods that contain a healthy balance of electrolytes, such as bananas, oranges, potatoes, tomatoes, and spinach.  Keep all follow-up visits as told by your health care provider. This is important. Contact a health care provider if:  You have abdominal pain that: ? Gets worse. ? Stays in one area (localizes).  You have a rash.  You have a stiff neck.  You are more irritable than usual.  You are sleepier or more difficult to wake up than usual.  You feel weak or dizzy.  You feel very thirsty.  You have urinated only a small amount of very dark urine over 6-8 hours. Get  help right away if:  You have symptoms of severe dehydration.  You cannot drink fluids without vomiting.  Your symptoms get worse with treatment.  You have a fever.  You have a severe headache.  You have vomiting or diarrhea that: ? Gets worse. ? Does not go away.  You have blood or green matter (bile) in your vomit.  You have blood in your stool. This may cause stool to look black and tarry.  You have not urinated in 6-8 hours.  You faint.  Your heart rate while sitting still is over 100 beats a minute.  You have trouble breathing. This information is not intended to replace advice given to you by your health care provider. Make sure you discuss any questions you have with your health care provider. Document Released: 11/20/2005 Document Revised: 06/16/2016 Document Reviewed: 01/14/2016 Elsevier Interactive Patient Education  [employer].

## 2017-10-02 ENCOUNTER — Ambulatory Visit: Payer: Self-pay

## 2017-10-02 ENCOUNTER — Telehealth: Payer: Self-pay | Admitting: Hematology and Oncology

## 2017-10-02 LAB — PHOSPHORUS: Phosphorus, Ser: 2.3 mg/dL — ABNORMAL LOW (ref 2.5–4.5)

## 2017-10-02 NOTE — Telephone Encounter (Signed)
Spoke with Burr Medico - and gave him the appts added per 10/29 sch msg.

## 2017-10-03 ENCOUNTER — Encounter (HOSPITAL_COMMUNITY): Payer: Self-pay

## 2017-10-03 ENCOUNTER — Emergency Department (HOSPITAL_COMMUNITY): Payer: Medicaid Other

## 2017-10-03 ENCOUNTER — Inpatient Hospital Stay (HOSPITAL_COMMUNITY)
Admission: EM | Admit: 2017-10-03 | Discharge: 2017-10-08 | DRG: 871 | Disposition: A | Discharge status: Skilled Nursing Facility | Payer: Medicaid Other | Attending: Family Medicine | Admitting: Family Medicine

## 2017-10-03 ENCOUNTER — Encounter (HOSPITAL_COMMUNITY): Payer: Medicaid Other

## 2017-10-03 DIAGNOSIS — J9601 Acute respiratory failure with hypoxia: Secondary | ICD-10-CM | POA: Diagnosis present

## 2017-10-03 DIAGNOSIS — C8338 Diffuse large B-cell lymphoma, lymph nodes of multiple sites: Secondary | ICD-10-CM | POA: Diagnosis not present

## 2017-10-03 DIAGNOSIS — Z79891 Long term (current) use of opiate analgesic: Secondary | ICD-10-CM | POA: Diagnosis not present

## 2017-10-03 DIAGNOSIS — T380X5A Adverse effect of glucocorticoids and synthetic analogues, initial encounter: Secondary | ICD-10-CM | POA: Diagnosis present

## 2017-10-03 DIAGNOSIS — C8510 Unspecified B-cell lymphoma, unspecified site: Secondary | ICD-10-CM | POA: Diagnosis not present

## 2017-10-03 DIAGNOSIS — E43 Unspecified severe protein-calorie malnutrition: Secondary | ICD-10-CM | POA: Diagnosis present

## 2017-10-03 DIAGNOSIS — Z7952 Long term (current) use of systemic steroids: Secondary | ICD-10-CM | POA: Diagnosis not present

## 2017-10-03 DIAGNOSIS — Z681 Body mass index (BMI) 19 or less, adult: Secondary | ICD-10-CM

## 2017-10-03 DIAGNOSIS — R627 Adult failure to thrive: Secondary | ICD-10-CM | POA: Diagnosis present

## 2017-10-03 DIAGNOSIS — N390 Urinary tract infection, site not specified: Secondary | ICD-10-CM | POA: Diagnosis present

## 2017-10-03 DIAGNOSIS — N139 Obstructive and reflux uropathy, unspecified: Secondary | ICD-10-CM | POA: Diagnosis present

## 2017-10-03 DIAGNOSIS — L8995 Pressure ulcer of unspecified site, unstageable: Secondary | ICD-10-CM | POA: Insufficient documentation

## 2017-10-03 DIAGNOSIS — Z936 Other artificial openings of urinary tract status: Secondary | ICD-10-CM

## 2017-10-03 DIAGNOSIS — J189 Pneumonia, unspecified organism: Secondary | ICD-10-CM

## 2017-10-03 DIAGNOSIS — R0902 Hypoxemia: Secondary | ICD-10-CM

## 2017-10-03 DIAGNOSIS — Y95 Nosocomial condition: Secondary | ICD-10-CM | POA: Diagnosis present

## 2017-10-03 DIAGNOSIS — C8330 Diffuse large B-cell lymphoma, unspecified site: Secondary | ICD-10-CM | POA: Diagnosis present

## 2017-10-03 DIAGNOSIS — I2699 Other pulmonary embolism without acute cor pulmonale: Secondary | ICD-10-CM | POA: Diagnosis present

## 2017-10-03 DIAGNOSIS — E860 Dehydration: Secondary | ICD-10-CM | POA: Diagnosis present

## 2017-10-03 DIAGNOSIS — Z436 Encounter for attention to other artificial openings of urinary tract: Secondary | ICD-10-CM | POA: Diagnosis not present

## 2017-10-03 DIAGNOSIS — C7951 Secondary malignant neoplasm of bone: Secondary | ICD-10-CM | POA: Diagnosis present

## 2017-10-03 DIAGNOSIS — Z79899 Other long term (current) drug therapy: Secondary | ICD-10-CM | POA: Diagnosis not present

## 2017-10-03 DIAGNOSIS — A419 Sepsis, unspecified organism: Principal | ICD-10-CM

## 2017-10-03 DIAGNOSIS — N201 Calculus of ureter: Secondary | ICD-10-CM | POA: Diagnosis present

## 2017-10-03 DIAGNOSIS — Z923 Personal history of irradiation: Secondary | ICD-10-CM

## 2017-10-03 DIAGNOSIS — E2740 Unspecified adrenocortical insufficiency: Secondary | ICD-10-CM | POA: Diagnosis present

## 2017-10-03 DIAGNOSIS — E876 Hypokalemia: Secondary | ICD-10-CM | POA: Diagnosis present

## 2017-10-03 DIAGNOSIS — L8990 Pressure ulcer of unspecified site, unspecified stage: Secondary | ICD-10-CM | POA: Diagnosis present

## 2017-10-03 LAB — PROTIME-INR
INR: 1.25
PROTHROMBIN TIME: 15.6 s — AB (ref 11.4–15.2)

## 2017-10-03 LAB — CBC
HEMATOCRIT: 29.9 % — AB (ref 39.0–52.0)
HEMOGLOBIN: 9.2 g/dL — AB (ref 13.0–17.0)
MCH: 24.9 pg — ABNORMAL LOW (ref 26.0–34.0)
MCHC: 30.8 g/dL (ref 30.0–36.0)
MCV: 80.8 fL (ref 78.0–100.0)
Platelets: 415 10*3/uL — ABNORMAL HIGH (ref 150–400)
RBC: 3.7 MIL/uL — ABNORMAL LOW (ref 4.22–5.81)
RDW: 21 % — AB (ref 11.5–15.5)
WBC: 3.4 10*3/uL — ABNORMAL LOW (ref 4.0–10.5)

## 2017-10-03 LAB — BASIC METABOLIC PANEL
ANION GAP: 16 — AB (ref 5–15)
BUN: 10 mg/dL (ref 6–20)
CHLORIDE: 103 mmol/L (ref 101–111)
CO2: 20 mmol/L — AB (ref 22–32)
Calcium: 6.9 mg/dL — ABNORMAL LOW (ref 8.9–10.3)
Creatinine, Ser: 0.42 mg/dL — ABNORMAL LOW (ref 0.61–1.24)
GFR calc Af Amer: >60 mL/min (ref >60)
GFR calc non Af Amer: >60 mL/min (ref >60)
GLUCOSE: 97 mg/dL (ref 65–99)
POTASSIUM: 3 mmol/L — AB (ref 3.5–5.1)
Sodium: 139 mmol/L (ref 135–145)

## 2017-10-03 LAB — HEPATIC FUNCTION PANEL
ALK PHOS: 158 U/L — AB (ref 38–126)
ALT: 31 U/L (ref 17–63)
AST: 19 U/L (ref 15–41)
Albumin: 2.8 g/dL — ABNORMAL LOW (ref 3.5–5.0)
BILIRUBIN DIRECT: 0.3 mg/dL (ref 0.1–0.5)
BILIRUBIN TOTAL: 1.6 mg/dL — AB (ref 0.3–1.2)
Indirect Bilirubin: 1.3 mg/dL — ABNORMAL HIGH (ref 0.3–0.9)
Total Protein: 5.9 g/dL — ABNORMAL LOW (ref 6.5–8.1)

## 2017-10-03 LAB — URINALYSIS, ROUTINE W REFLEX MICROSCOPIC
Bilirubin Urine: NEGATIVE
GLUCOSE, UA: NEGATIVE mg/dL
Ketones, ur: 80 mg/dL — AB
NITRITE: NEGATIVE
PH: 5 (ref 5.0–8.0)
Protein, ur: >=300 mg/dL — AB
SPECIFIC GRAVITY, URINE: 1.022 (ref 1.005–1.030)
Squamous Epithelial / LPF: NONE SEEN

## 2017-10-03 LAB — DIFFERENTIAL
BASOS ABS: 0 10*3/uL (ref 0.0–0.1)
BASOS PCT: 0 %
EOS ABS: 0 10*3/uL (ref 0.0–0.7)
EOS PCT: 0 %
LYMPHS ABS: 0.6 10*3/uL — AB (ref 0.7–4.0)
Lymphocytes Relative: 16 %
Monocytes Absolute: 0.9 10*3/uL (ref 0.1–1.0)
Monocytes Relative: 27 %
Neutro Abs: 2 10*3/uL (ref 1.7–7.7)
Neutrophils Relative %: 57 %

## 2017-10-03 LAB — HEPARIN LEVEL (UNFRACTIONATED): Heparin Unfractionated: <0.1 IU/mL — ABNORMAL LOW (ref 0.30–0.70)

## 2017-10-03 LAB — LACTIC ACID, PLASMA
LACTIC ACID, VENOUS: 0.9 mmol/L (ref 0.5–1.9)
LACTIC ACID, VENOUS: 0.9 mmol/L (ref 0.5–1.9)

## 2017-10-03 LAB — APTT: aPTT: 40 seconds — ABNORMAL HIGH (ref 24–36)

## 2017-10-03 LAB — PHOSPHORUS: Phosphorus: 1.6 mg/dL — ABNORMAL LOW (ref 2.5–4.6)

## 2017-10-03 LAB — MAGNESIUM: Magnesium: 2 mg/dL (ref 1.7–2.4)

## 2017-10-03 LAB — PROCALCITONIN: PROCALCITONIN: 0.17 ng/mL

## 2017-10-03 LAB — MRSA PCR SCREENING: MRSA by PCR: POSITIVE — AB

## 2017-10-03 MED ORDER — OXYCODONE HCL 5 MG PO TABS
10.0000 mg | ORAL_TABLET | ORAL | Status: DC | PRN
  Administered 2017-10-04 – 2017-10-07 (×7): 10 mg via ORAL
  Filled 2017-10-03 (×7): qty 2

## 2017-10-03 MED ORDER — HEPARIN BOLUS VIA INFUSION
2000.0000 [IU] | Freq: Once | INTRAVENOUS | Status: AC
  Administered 2017-10-03: 2000 [IU] via INTRAVENOUS
  Filled 2017-10-03: qty 2000

## 2017-10-03 MED ORDER — OXYCODONE HCL ER 10 MG PO T12A
10.0000 mg | EXTENDED_RELEASE_TABLET | Freq: Two times a day (BID) | ORAL | Status: DC
  Administered 2017-10-03 – 2017-10-08 (×10): 10 mg via ORAL
  Filled 2017-10-03 (×10): qty 1

## 2017-10-03 MED ORDER — CEFEPIME HCL 2 G IJ SOLR
2.0000 g | Freq: Three times a day (TID) | INTRAMUSCULAR | Status: DC
  Administered 2017-10-03 – 2017-10-06 (×9): 2 g via INTRAVENOUS
  Filled 2017-10-03 (×10): qty 2

## 2017-10-03 MED ORDER — VANCOMYCIN HCL IN DEXTROSE 1-5 GM/200ML-% IV SOLN
1000.0000 mg | Freq: Two times a day (BID) | INTRAVENOUS | Status: DC
  Administered 2017-10-03 – 2017-10-06 (×6): 1000 mg via INTRAVENOUS
  Filled 2017-10-03 (×6): qty 200

## 2017-10-03 MED ORDER — DRONABINOL 2.5 MG PO CAPS
5.0000 mg | ORAL_CAPSULE | Freq: Every day | ORAL | Status: DC
  Administered 2017-10-04 – 2017-10-08 (×5): 5 mg via ORAL
  Filled 2017-10-03 (×5): qty 2

## 2017-10-03 MED ORDER — SODIUM CHLORIDE 0.9% FLUSH
10.0000 mL | INTRAVENOUS | Status: DC | PRN
  Administered 2017-10-05: 10 mL
  Filled 2017-10-03: qty 40

## 2017-10-03 MED ORDER — HYDROCORTISONE NA SUCCINATE PF 100 MG IJ SOLR
50.0000 mg | Freq: Three times a day (TID) | INTRAMUSCULAR | Status: DC
  Administered 2017-10-03 – 2017-10-04 (×4): 50 mg via INTRAVENOUS
  Filled 2017-10-03 (×4): qty 2

## 2017-10-03 MED ORDER — CHLORHEXIDINE GLUCONATE 0.12 % MT SOLN
15.0000 mL | Freq: Two times a day (BID) | OROMUCOSAL | Status: DC
  Administered 2017-10-06 – 2017-10-08 (×2): 15 mL via OROMUCOSAL
  Filled 2017-10-03 (×9): qty 15

## 2017-10-03 MED ORDER — SENNOSIDES-DOCUSATE SODIUM 8.6-50 MG PO TABS: 1.0000 | ORAL_TABLET | Freq: Every day | ORAL | Status: DC | PRN

## 2017-10-03 MED ORDER — ONDANSETRON HCL 4 MG PO TABS: 8.0000 mg | ORAL_TABLET | Freq: Three times a day (TID) | ORAL | Status: DC | PRN

## 2017-10-03 MED ORDER — LORAZEPAM 0.5 MG PO TABS: 0.5000 mg | ORAL_TABLET | Freq: Four times a day (QID) | ORAL | Status: DC | PRN

## 2017-10-03 MED ORDER — PANTOPRAZOLE SODIUM 40 MG PO TBEC
40.0000 mg | DELAYED_RELEASE_TABLET | Freq: Every day | ORAL | Status: DC
Start: 2017-10-04 — End: 2017-10-08
  Administered 2017-10-04 – 2017-10-08 (×5): 40 mg via ORAL
  Filled 2017-10-03 (×5): qty 1

## 2017-10-03 MED ORDER — HEPARIN (PORCINE) IN NACL 100-0.45 UNIT/ML-% IJ SOLN
1100.0000 [IU]/h | INTRAMUSCULAR | Status: DC
  Administered 2017-10-03: 1100 [IU]/h via INTRAVENOUS
  Filled 2017-10-03: qty 250

## 2017-10-03 MED ORDER — IOPAMIDOL (ISOVUE-370) INJECTION 76%
100.0000 mL | Freq: Once | INTRAVENOUS | Status: AC | PRN
  Administered 2017-10-03: 100 mL via INTRAVENOUS

## 2017-10-03 MED ORDER — MUPIROCIN 2 % EX OINT
1.0000 "application " | TOPICAL_OINTMENT | Freq: Two times a day (BID) | CUTANEOUS | Status: AC
  Administered 2017-10-03 – 2017-10-08 (×10): 1 via NASAL
  Filled 2017-10-03 (×2): qty 22

## 2017-10-03 MED ORDER — POLYETHYLENE GLYCOL 3350 17 G PO PACK
17.0000 g | PACK | Freq: Every day | ORAL | Status: DC
  Filled 2017-10-03 (×5): qty 1

## 2017-10-03 MED ORDER — OLANZAPINE 5 MG PO TBDP
2.5000 mg | ORAL_TABLET | Freq: Every day | ORAL | Status: DC
  Administered 2017-10-03 – 2017-10-07 (×5): 2.5 mg via ORAL
  Filled 2017-10-03 (×5): qty 0.5

## 2017-10-03 MED ORDER — CEFEPIME HCL 2 G IJ SOLR
2.0000 g | Freq: Once | INTRAMUSCULAR | Status: AC
  Administered 2017-10-03: 2 g via INTRAVENOUS
  Filled 2017-10-03: qty 2

## 2017-10-03 MED ORDER — VANCOMYCIN HCL IN DEXTROSE 1-5 GM/200ML-% IV SOLN
1000.0000 mg | Freq: Once | INTRAVENOUS | Status: AC
  Administered 2017-10-03: 1000 mg via INTRAVENOUS
  Filled 2017-10-03: qty 200

## 2017-10-03 MED ORDER — PROCHLORPERAZINE MALEATE 10 MG PO TABS: 10.0000 mg | ORAL_TABLET | Freq: Four times a day (QID) | ORAL | Status: DC | PRN

## 2017-10-03 MED ORDER — POTASSIUM CHLORIDE 10 MEQ/100ML IV SOLN
10.0000 meq | INTRAVENOUS | Status: AC
  Administered 2017-10-03 (×3): 10 meq via INTRAVENOUS
  Filled 2017-10-03 (×4): qty 100

## 2017-10-03 MED ORDER — SODIUM CHLORIDE 0.9 % IV SOLN
INTRAVENOUS | Status: AC
  Administered 2017-10-04: 05:00:00 via INTRAVENOUS

## 2017-10-03 MED ORDER — MAGNESIUM OXIDE 400 (241.3 MG) MG PO TABS
400.0000 mg | ORAL_TABLET | Freq: Two times a day (BID) | ORAL | Status: DC
  Administered 2017-10-03 – 2017-10-08 (×10): 400 mg via ORAL
  Filled 2017-10-03 (×10): qty 1

## 2017-10-03 MED ORDER — IOPAMIDOL (ISOVUE-370) INJECTION 76%
INTRAVENOUS | Status: AC
  Administered 2017-10-03: 100 mL via INTRAVENOUS
  Filled 2017-10-03: qty 100

## 2017-10-03 MED ORDER — SODIUM CHLORIDE 0.9 % IV BOLUS (SEPSIS)
1000.0000 mL | Freq: Once | INTRAVENOUS | Status: AC
  Administered 2017-10-03: 1000 mL via INTRAVENOUS

## 2017-10-03 MED ORDER — POTASSIUM & SODIUM PHOSPHATES 280-160-250 MG PO PACK
1.0000 | PACK | Freq: Three times a day (TID) | ORAL | Status: DC
  Administered 2017-10-03 – 2017-10-08 (×18): 1 via ORAL
  Filled 2017-10-03 (×22): qty 1

## 2017-10-03 MED ORDER — CHLORHEXIDINE GLUCONATE CLOTH 2 % EX PADS
6.0000 | MEDICATED_PAD | Freq: Every day | CUTANEOUS | Status: AC
  Administered 2017-10-04 – 2017-10-08 (×5): 6 via TOPICAL

## 2017-10-03 MED ORDER — HEPARIN (PORCINE) IN NACL 100-0.45 UNIT/ML-% IJ SOLN
1300.0000 [IU]/h | INTRAMUSCULAR | Status: DC
  Administered 2017-10-03: 1300 [IU]/h via INTRAVENOUS
  Filled 2017-10-03: qty 250

## 2017-10-03 NOTE — Consult Note (Signed)
New Hematology/Oncology Consult   Referral MD: Dr Florencia Reasons     Reason for Referral: Patient with diagnosis of DLBCL, undergoing systemic chemotherapy. Admitted due to suspected recurrence of urinary tract infection, new diagnosis of pulmonary embolism.  HPI: [name] is a 56 y.o. brought to the ER by the SN[facility] where he is currently residing due to concern for possible UTI. During valuation in the emergency room found have hypoxia, imaging of the chest demonstrated small pulmonary embolism and pneumonia.  Urine collected from the percutaneous nephrostomy suspicious for recurrent infection. Patient currently is feeling weak, tired, but reports reasonably controlled pain.  He denies active shortness of breath at rest.  His oncological history is complex. He initially presented with back pain and was found to have a pathological fracture to disseminated malignancy. He was initially treated with radiotherapy due to cord compression, but was able to subsequently start systemic therapy with R-CHOP. Unfortunately, The first cycle of systemic chemotherapy was complicated by neophrolithiasis resulting in urinary tract infection in obstructed urinary system leading to Septic shock.  Patient has recovered, but subsequently has developed profound loss of appetite and dramatic decrease in his performance status down to [facility].  The second cycle of therapy was delayed to allow for improvement in performance status, subsequently patient was restarted on chemotherapy with significant those reduction and he has received mini-R-CHOP treatment with prophylactic intrathecal methotrexate on 09/17/17. He was hospitalized for a week due to significant electrolyte aberrations including hypophosphatemia, hypocalcemia, hypokalemia, and hypomagnesemia in the setting of severe combined calorie/protein malnutrition. He was discharged from the hospital in improving physical state that I have attributed to steroid Therapy.  Discharge,  patient was sent on prednisone 10 mg daily in addition to prophylactic knocks apparent that was started during his hospitalization due to severe diminished mobility.    Past Medical History:  Diagnosis Date  . Cancer ([place])   . Tendonitis   :  Past Surgical History:  Procedure Laterality Date  . CYSTOSCOPY W/ URETERAL STENT PLACEMENT Right 08/21/2017   Procedure: CYSTOSCOPY WITH RETROGRADE PYELOGRAM, RIGHT;  Surgeon: Alexis Frock, MD;  Location: [facility];  Service: Urology;  Laterality: Right;  . HERNIA REPAIR  1988   Dr. Vida Rigger  . IR FLUORO GUIDE PORT INSERTION RIGHT  07/26/2017  . IR NEPHROSTOMY PLACEMENT RIGHT  08/21/2017  . IR US GUIDE VASC ACCESS RIGHT  07/26/2017  :   Current Facility-Administered Medications:  .  heparin ADULT infusion 100 units/mL (25000 units/257mL sodium chloride 0.45%), 1,100 Units/hr, Intravenous, Continuous, Adrian Saran, RPH .  heparin bolus via infusion 2,000 Units, 2,000 Units, Intravenous, Once, Adrian Saran, [facility] .  vancomycin (VANCOCIN) IVPB 1000 mg/200 mL premix, 1,000 mg, Intravenous, Once, Thomes Lolling, [facility], Last Rate: 200 mL/hr at 10/03/17 1419, 1,000 mg at 10/03/17 1419  Current Outpatient Prescriptions:  .  calcium-vitamin D (OSCAL WITH D) 500-200 MG-UNIT tablet, Take 1 tablet by mouth 2 (two) times daily., Disp: 60 tablet, Rfl: 0 .  chlorhexidine (PERIDEX) 0.12 % solution, 15 mLs by Mouth Rinse route 2 (two) times daily., Disp: 120 mL, Rfl: 0 .  dronabinol (MARINOL) 5 MG capsule, Take 1 capsule (5 mg total) by mouth daily before lunch., Disp: 30 capsule, Rfl: 0 .  enoxaparin (LOVENOX) 40 MG/0.4ML injection, Inject 0.4 mLs (40 mg total) into the skin daily., Disp: 0 Syringe, Rfl:  .  Lidocaine-Prilocaine, Bulk, 2.5-2.5 % CREA, Apply to affected areas topically one time daily, Disp: , Rfl:  .  LORazepam (ATIVAN) 0.5 MG tablet, Take 1 tablet (0.5 mg total) by mouth every 6 (six) hours as needed (Nausea or vomiting)., Disp: 48 tablet,  Rfl: 0 .  magnesium oxide (MAG-OX) 400 (241.3 Mg) MG tablet, Take 1 tablet (400 mg total) by mouth 2 (two) times daily., Disp: 60 tablet, Rfl: 0 .  Multiple Vitamin (MULTIVITAMIN WITH MINERALS) TABS tablet, Take 1 tablet by mouth daily., Disp: , Rfl:  .  OLANZapine zydis (ZYPREXA) 5 MG disintegrating tablet, Take 0.5 tablets (2.5 mg total) by mouth at bedtime., Disp: 30 tablet, Rfl: 0 .  ondansetron (ZOFRAN) 8 MG tablet, Take 1 tablet (8 mg total) by mouth every 8 (eight) hours as needed for nausea or vomiting., Disp: 20 tablet, Rfl: 3 .  oxyCODONE (OXYCONTIN) 10 mg 12 hr tablet, Take 1 tablet (10 mg total) by mouth every 12 (twelve) hours., Disp: 60 tablet, Rfl: 0 .  Oxycodone HCl 10 MG TABS, Take 1 tablet (10 mg total) by mouth every 4 (four) hours. (Patient taking differently: Take 10 mg by mouth every 4 (four) hours as needed (for moderate to severe pain). ), Disp: 100 tablet, Rfl: 0 .  pantoprazole (PROTONIX) 40 MG tablet, Take 40 mg by mouth daily with lunch. , Disp: , Rfl:  .  polyethylene glycol (MIRALAX / GLYCOLAX) packet, Take 17 g by mouth daily as needed for mild constipation. (Patient taking differently: Take 17 g by mouth daily. ), Disp: 14 each, Rfl: 0 .  potassium & sodium phosphates (PHOS-NAK) 280-160-250 MG PACK, Take 1 packet by mouth 4 (four) times daily -  before meals and at bedtime., Disp: 120 packet, Rfl: 0 .  predniSONE (DELTASONE) 10 MG tablet, Take 2 tablets (20 mg total) by mouth daily with breakfast., Disp: 56 tablet, Rfl: 0 .  prochlorperazine (COMPAZINE) 10 MG tablet, Take 1 tablet (10 mg total) by mouth every 6 (six) hours as needed for nausea or vomiting., Disp: 30 tablet, Rfl: 0 .  senna-docusate (SENOKOT-S) 8.6-50 MG tablet, Take 1 tablet by mouth daily as needed for mild constipation., Disp: , Rfl:  .  sulfamethoxazole-trimethoprim (BACTRIM,SEPTRA) 200-40 MG/5ML suspension, Take 20 mLs by mouth every Monday, Wednesday, and Friday. Continuous, Disp: , Rfl:  .   sulfamethoxazole-trimethoprim (BACTRIM DS,SEPTRA DS) 800-160 MG tablet, Take 1 tablet by mouth 3 (three) times a week. (Patient not taking: Reported on 10/03/2017), Disp: 30 tablet, Rfl: 3  Facility-Administered Medications Ordered in Other Encounters:  .  sodium chloride flush (NS) 0.9 % injection 10 mL, 10 mL, Intracatheter, PRN, Ardath Sax, MD, 10 mL at 08/09/17 1702:  . heparin  2,000 Units Intravenous Once  :  Allergies  Allergen Reactions  . Bee Venom Swelling  :   Family History  Problem Relation Age of Onset  . Cancer Father      Social History   Social History  . Marital status: Single    Spouse name: N/A  . Number of children: 0  . Years of education: N/A   Occupational History  . Not on file.   Social History Main Topics  . Smoking status: Never Smoker  . Smokeless tobacco: Never Used  . Alcohol use Yes     Comment: occasionally/socially  . Drug use: No  . Sexual activity: No   Other Topics Concern  . 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 Never used smokeless tobacco.   No e-cigarettes  Occasional alcohol consumption socially.   Single, no children.    Review of Systems: As per HPI, all other systems are negative.  Physical Exam:  Blood pressure (!) 97/54, pulse 98, temperature 98.7 F (37.1 C), temperature source Oral, resp. rate 16, height 6\' 2"  (1.88 m), weight 140 lb (63.5 kg), SpO2 99 %.  Patient is alert, awake, oriented times three, appears to be comfortable at this time. Cachectic, but does not appear to be toxic HEENT: Anicteric sclerae, moist mucous membranes Lungs: CTA BL anteriorly Cardiac: S1/S2, regular, no LE edema Abdomen: Soft, non-tender, non-distended GU: Rt perc neph tube in place. Turbid urine with increased smell compared to discharge Lymph nodes: No palpable LAD Neurologic: Deferred Skin: Dry, pale Musculoskeletal: Severe muscle  atrophy  LABS:   Recent Labs  10/01/17 1407 10/03/17 0930  WBC 1.3* 3.4*  HGB 9.9* 9.2*  HCT 30.7* 29.9*  PLT 366 415*     Recent Labs  10/01/17 1407 10/03/17 0930  NA 138 139  K 3.8 3.0*  CL  --  103  CO2 21* 20*  GLUCOSE 104 97  BUN 10.9 10  CREATININE 0.5* 0.42*  CALCIUM 7.7* 6.9*      RADIOLOGY:  Dg Chest 2 View  Result Date: 10/03/2017 CLINICAL DATA:  Shortness of breath. EXAM: CHEST  2 VIEW COMPARISON:  Radiographs of August 21, 2017. FINDINGS: The heart size and mediastinal contours are within normal limits. Both lungs are clear. No pneumothorax or pleural effusion is noted. Right internal jugular Port-A-Cath is unchanged in position. Hyperexpansion of the lungs is noted. The visualized skeletal structures are unremarkable. IMPRESSION: No active cardiopulmonary disease. Electronically Signed   By: Marijo Conception, M.D.   On: 10/03/2017 10:20   Ct Angio Chest Pe W And/or Wo Contrast  Result Date: 10/03/2017 CLINICAL DATA:  History of lymphoma. There a tract infection. Short of breath. Hypoxia EXAM: CT ANGIOGRAPHY CHEST WITH CONTRAST TECHNIQUE: Multidetector CT imaging of the chest was performed using the standard protocol during bolus administration of intravenous contrast. Multiplanar CT image reconstructions and MIPs were obtained to evaluate the vascular anatomy. CONTRAST:  80 mL Isovue COMPARISON:  None. FINDINGS: Cardiovascular: There 2 small tubular filling defects within the proximal LEFT lower lobe pulmonary artery and proximal lingular pulmonary artery (image 61, series 5). These are consistent with thromboemboli. These are nonocclusive. No additional filling defects identified within the pulmonary arteries bold No acute findings aorta great vessels. Heart is normal. No evidence of RIGHT ventricular strain with the RIGHT ventricular to LEFT ventricular diameter ratio less than 1. Mediastinum/Nodes: No axillary or supraclavicular adenopathy. No mediastinal  hilar adenopathy. No pericardial fluid. Esophagus normal Lungs/Pleura: Within the LEFT lower lobe there is patchy nodular airspace disease involving the superior segment and the basilar segments. There is some consolidation of basilar segments. There is frothy material within the lower lobe bronchi (image 76, series 8). RIGHT lung is clear. Upper Abdomen: Limited view of the liver, kidneys, pancreas are unremarkable. Normal adrenal glands. Musculoskeletal: Again demonstrated lytic and expansile lesions within the posterior LEFT ribs and LEFT scapula (image 45, series 5 and image 56, series 5). Findings consistent bone metastasis previously described. Review of the MIP images confirms the above findings. IMPRESSION: 1. Acute pulmonary embolism within the LEFT lower lobe and lingula. These small emboli are nonocclusive. Overall clot burden is minimal. 2. No evidence of RIGHT ventricular strain. 3. Patchy airspace disease in the LEFT lower lobe. Favor pulmonary infection such as pneumonia or aspiration  rather than pulmonary infarction. 4. Frothy material within the LEFT lower lobe bronchus could represent aspirate from tracheal aspiration. Findings conveyed toNATHAN PICKERING on 10/03/2017  at12:55. Electronically Signed   By: Suzy Bouchard M.D.   On: 10/03/2017 13:00   Dg Fluoro Guided Loc Of Needle/cath Tip For Spinal Inject Lt  Result Date: 09/18/2017 CLINICAL DATA:  B-cell lymphoma.  For intrathecal chemotherapy EXAM: DIAGNOSTIC LUMBAR PUNCTURE UNDER FLUOROSCOPIC GUIDANCE FLUOROSCOPY TIME:  Fluoroscopy Time:  0 minutes 16 second Radiation Exposure Index (if provided by the fluoroscopic device): Number of Acquired Spot Images: 0 PROCEDURE: Informed consent was obtained from the patient prior to the procedure, including potential complications of headache, allergy, and pain. With the patient prone, the lower back was prepped with Betadine. 1% Lidocaine was used for local anesthesia. Lumbar puncture was  performed at the L4-5 level using a 20 gauge needle with return of slightly blood-tinged CSF which cleared, with an opening pressure of 9 cm water. 13.5 ml of CSF were obtained for laboratory studies. 12 mg of methotrexate was injected into the subarachnoid space. The patient tolerated the procedure well and there were no apparent complications. IMPRESSION: Successful lumbar puncture and injection of intrathecal methotrexate Electronically Signed   By: Franchot Gallo M.D.   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 Currently admitted for PE, pneumonia and possible recurrent UTI.  Recommendations: --Agree with heparin gtt for now in case patient needs interventions, later, recommend transition to apixaban or rivaroxaban --Agree with the current empiric antibiotic choice to be tailored later as the sensitivities become available --Patient is prone to electrolyte abnormalities, recommend aggressive replacement -- will defer treatment response assessment until clinical situation stabilized or patient is discharged. It is unlikely that they will proceed with next round of chemotherapy while he is in the hospital.     Ardath Sax, MD 10/03/2017, 3:09 PM

## 2017-10-03 NOTE — ED Notes (Signed)
Bed: VT55 Expected date: 10/03/17 Expected time: 8:48 AM Means of arrival: Ambulance Comments: EMS-UTI

## 2017-10-03 NOTE — Progress Notes (Signed)
Pharmacy Antibiotic Note  Cory Ibarra is a 56 y.o. 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  To start vancomycin and cefepime for UTI and suspected sepsis.  Plan: - vancomycin 1000 mg IV q12h for est AUC 477 (goal 400-500) - cefepime 2gm IV q8h   _________________________________ Height: 6\' 2"  (188 cm) Weight: 140 lb (63.5 kg) IBW/kg (Calculated) : 82.2  Temp (24hrs), Avg:98.7 F (37.1 C), Min:98.7 F (37.1 C), Max:98.8 F (37.1 C)   Recent Labs Lab 10/01/17 1407 10/01/17 1407 10/03/17 0930  WBC 1.3*  --  3.4*  CREATININE  --  0.5* 0.42*    Estimated Creatinine Clearance: 90.4 mL/min (A) (by C-G formula based on SCr of 0.42 mg/dL (L)).    Allergies  Allergen Reactions  . Bee Venom Swelling     Thank you for allowing pharmacy to be a part of this patient's care.  Lynelle Doctor 10/03/2017 5:44 PM

## 2017-10-03 NOTE — Progress Notes (Signed)
Pharmacy: Re- heparin  Cory Ibarra is a 56 y.o. 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  Chest CTA showed acute LLL PE with no evidence of right heart strain.  Heparin drip started on admission for PE.  - first heparin level now back undetectable  - per RN, no issues with IV line - no bleeding documented  Plan: - heparin 2000 units IV x1 bolus, then increase drip to 1300 units/hr - check 6 hr heparin level - monitor for s/s bleeding  Dia Sitter, PharmD, BCPS 10/03/2017 9:30 PM

## 2017-10-03 NOTE — ED Provider Notes (Addendum)
[facility] DEPT Provider Note   CSN: [id number] Arrival date & time: 10/03/17  0848     History   Chief Complaint Chief Complaint  Patient presents with  . Urinary Tract Infection    HPI Cory Ibarra is a 56 y.o. male.  HPI Patient has a history of B-cell lymphoma.  Currently under treatment by the cancer center.  Reportedly was being brought in by the ambulance to be seen at the cancer center for his infusions but it was reportedly the wrong day so he was brought here because he did had cloudy urine and has been weak with decreased oral intake.  Has had a cough with some yellow sputum production.  He has a chronic Foley and chronic urostomy on the right side.  He is due to be on chemo in the next couple weeks.  No real abdominal pain.  States he was going to get IV fluids through the cancer center to strengthen him up for the chemotherapy. Past Medical History:  Diagnosis Date  . Cancer ([place])   . Tendonitis     Patient Active Problem List   Diagnosis Date Noted  . Liver function test abnormality   . Renal calculus or stone 09/19/2017  . Leakage of nephrostomy catheter ([place]) 09/19/2017  . Nephrostomy status ([place]) 09/19/2017  . Gastroesophageal reflux disease without esophagitis 09/19/2017  . Hypokalemia 09/18/2017  . Hypomagnesemia 09/18/2017  . Electrolyte abnormality   . Hypovolemia 09/17/2017  . Chemotherapy-induced nausea 09/17/2017  . Constipation 09/17/2017  . DLBCL (diffuse large B cell lymphoma) ([place]) 09/17/2017  . Hypocalcemia 09/17/2017  . Severe protein-calorie malnutrition ([place])   . Adjustment disorder with mixed anxiety and depressed mood   . Palliative care encounter   . Admission for antineoplastic chemotherapy   . Septic shock due to Escherichia coli ([place])   . Neutropenia ([place]) 08/21/2017  . Gross hematuria   . Lactic acidosis   . Neutropenic sepsis ([place])   . Hypophosphatemia 08/20/2017  . Diffuse large B cell lymphoma  ([place]) 07/03/2017  . Bony metastasis ([place]) 06/29/2017  . Chest wall mass   . 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 Closed compression fracture of L3 lumbar vertebra ([place]) 06/28/2017  . Severe back pain 06/28/2017  . Weight loss, unintentional 06/28/2017    Past Surgical History:  Procedure Laterality Date  . CYSTOSCOPY W/ URETERAL STENT PLACEMENT Right 08/21/2017   Procedure: CYSTOSCOPY WITH RETROGRADE PYELOGRAM, RIGHT;  Surgeon: Alexis Frock, MD;  Location: [facility];  Service: Urology;  Laterality: Right;  . HERNIA REPAIR  1988   Dr. Vida Rigger  . IR FLUORO GUIDE PORT INSERTION RIGHT  07/26/2017  . IR NEPHROSTOMY PLACEMENT RIGHT  08/21/2017  . IR US GUIDE VASC ACCESS RIGHT  07/26/2017       Home Medications    Prior to Admission medications   Medication Sig Start Date End Date Taking? Authorizing Provider  calcium-vitamin D (OSCAL WITH D) 500-200 MG-UNIT tablet Take 1 tablet by mouth 2 (two) times daily. 09/25/17 10/25/17 Yes Perlov, Marinell Blight, MD  chlorhexidine (PERIDEX) 0.12 % solution 15 mLs by Mouth Rinse route 2 (two) times daily. 09/03/17  Yes Theodis Blaze, MD  dronabinol (MARINOL) 5 MG capsule Take 1 capsule (5 mg total) by mouth daily before lunch. 09/25/17  Yes Gerlene Fee, NP  enoxaparin (LOVENOX) 40 MG/0.4ML injection Inject 0.4 mLs (40 mg total) into the skin daily. 09/25/17 10/25/17 Yes Perlov, Marinell Blight, MD  Lidocaine-Prilocaine, Bulk, 2.5-2.5 % CREA Apply to  affected areas topically one time daily   Yes [provider]  LORazepam (ATIVAN) 0.5 MG tablet Take 1 tablet (0.5 mg total) by mouth every 6 (six) hours as needed (Nausea or vomiting). 09/25/17 10/09/17 Yes Perlov, Marinell Blight, MD  magnesium oxide (MAG-OX) 400 (241.3 Mg) MG tablet Take 1 tablet (400 mg total) by mouth 2 (two) times daily. 09/25/17 10/25/17 Yes Ardath Sax, MD  Multiple Vitamin (MULTIVITAMIN WITH MINERALS) TABS tablet Take 1 tablet by mouth daily. 09/25/17  Yes Perlov, Marinell Blight, MD    OLANZapine zydis (ZYPREXA) 5 MG disintegrating tablet Take 0.5 tablets (2.5 mg total) by mouth at bedtime. 09/03/17  Yes Theodis Blaze, MD  ondansetron (ZOFRAN) 8 MG tablet Take 1 tablet (8 mg total) by mouth every 8 (eight) hours as needed for nausea or vomiting. 09/06/17 10/06/17 Yes Ardath Sax, MD  oxyCODONE (OXYCONTIN) 10 mg 12 hr tablet Take 1 tablet (10 mg total) by mouth every 12 (twelve) hours. 09/25/17 10/25/17 Yes Perlov, Marinell Blight, MD  Oxycodone HCl 10 MG TABS Take 1 tablet (10 mg total) by mouth every 4 (four) hours. Patient taking differently: Take 10 mg by mouth every 4 (four) hours as needed (for moderate to severe pain).  09/25/17  Yes Perlov, Marinell Blight, MD  pantoprazole (PROTONIX) 40 MG tablet Take 40 mg by mouth daily with lunch.    Yes [provider]  polyethylene glycol (MIRALAX / GLYCOLAX) packet Take 17 g by mouth daily as needed for mild constipation. Patient taking differently: Take 17 g by mouth daily.  07/03/17  Yes Domenic Polite, MD  potassium & sodium phosphates (PHOS-NAK) 280-160-250 MG PACK Take 1 packet by mouth 4 (four) times daily -  before meals and at bedtime. 09/25/17 10/25/17 Yes Perlov, Marinell Blight, MD  predniSONE (DELTASONE) 10 MG tablet Take 2 tablets (20 mg total) by mouth daily with breakfast. 10/01/17 10/29/17 Yes Perlov, Marinell Blight, MD  prochlorperazine (COMPAZINE) 10 MG tablet Take 1 tablet (10 mg total) by mouth every 6 (six) hours as needed for nausea or vomiting. 09/06/17 10/06/17 Yes Perlov, Marinell Blight, MD  senna-docusate (SENOKOT-S) 8.6-50 MG tablet Take 1 tablet by mouth daily as needed for mild constipation.   Yes [provider]  sulfamethoxazole-trimethoprim (BACTRIM,SEPTRA) 200-40 MG/5ML suspension Take 20 mLs by mouth every Monday, Wednesday, and Friday. Continuous   Yes [provider]  sulfamethoxazole-trimethoprim (BACTRIM DS,SEPTRA DS) 800-160 MG tablet Take 1 tablet by mouth 3 (three) times a week. Patient not  taking: Reported on 10/03/2017 10/01/17   Ardath Sax, MD    Family History Family History  Problem Relation Age of Onset  . Cancer Father     Social History Social History  Substance Use Topics  . Smoking status: Never Smoker  . Smokeless tobacco: Never Used  . Alcohol use Yes     Comment: occasionally/socially     Allergies   Bee venom   Review of Systems Review of Systems  Constitutional: Positive for appetite change and fatigue. Negative for fever.  HENT: Negative for congestion.   Respiratory: Negative for shortness of breath.   Cardiovascular: Negative for chest pain.  Gastrointestinal: Negative for abdominal pain.  Genitourinary: Negative for dysuria.  Musculoskeletal: Negative for back pain.  Neurological: Positive for light-headedness.  Hematological: Negative for adenopathy.  Psychiatric/Behavioral: Negative for confusion.     Physical Exam Updated Vital Signs BP 113/87 (BP Location: Left Arm)   Pulse (!) 111   Temp 98.7 F (37.1 C) (  Oral)   Resp 16   Ht 6\' 2"  (1.88 m)   Wt 63.5 kg (140 lb)   SpO2 99%   BMI 17.97 kg/m   Physical Exam  Constitutional: He appears well-developed.  Chronically ill-appearing  HENT:  Head: Normocephalic.  Eyes: Pupils are equal, round, and reactive to light.  Neck: Neck supple.  Cardiovascular:  Mild tachycardia  Pulmonary/Chest:  Diffuse harsh breath sounds wet cough.  Worse on the right side.  Abdominal: There is no tenderness.  Genitourinary:  Genitourinary Comments: Urostomy to right flank.  Somewhat concentrated urine.  Foley catheter in place in his penis.  Cloudy urine.  Musculoskeletal: Normal range of motion.  Neurological: He is alert.  Skin: Skin is warm. Capillary refill takes less than 2 seconds.     ED Treatments / Results  Labs (all labs ordered are listed, but only abnormal results are displayed) Labs Reviewed  URINALYSIS, ROUTINE W REFLEX MICROSCOPIC - Abnormal; Notable for the  following:       Result Value   Color, Urine AMBER (*)    APPearance CLOUDY (*)    Hgb urine dipstick LARGE (*)    Ketones, ur 80 (*)    Protein, ur >=300 (*)    Leukocytes, UA MODERATE (*)    Bacteria, UA RARE (*)    All other components within normal limits  BASIC METABOLIC PANEL - Abnormal; Notable for the following:    Potassium 3.0 (*)    CO2 20 (*)    Creatinine, Ser 0.42 (*)    Calcium 6.9 (*)    Anion gap 16 (*)    All other components within normal limits  CBC - Abnormal; Notable for the following:    WBC 3.4 (*)    RBC 3.70 (*)    Hemoglobin 9.2 (*)    HCT 29.9 (*)    MCH 24.9 (*)    RDW 21.0 (*)    Platelets 415 (*)    All other components within normal limits  PHOSPHORUS - Abnormal; Notable for the following:    Phosphorus 1.6 (*)    All other components within normal limits  HEPATIC FUNCTION PANEL - Abnormal; Notable for the following:    Total Protein 5.9 (*)    Albumin 2.8 (*)    Alkaline Phosphatase 158 (*)    Total Bilirubin 1.6 (*)    Indirect Bilirubin 1.3 (*)    All other components within normal limits  DIFFERENTIAL - Abnormal; Notable for the following:    Lymphs Abs 0.6 (*)    All other components within normal limits  URINE CULTURE  MAGNESIUM    EKG  EKG Interpretation None       Radiology Dg Chest 2 View  Result Date: 10/03/2017 CLINICAL DATA:  Shortness of breath. EXAM: CHEST  2 VIEW COMPARISON:  Radiographs of August 21, 2017. FINDINGS: The heart size and mediastinal contours are within normal limits. Both lungs are clear. No pneumothorax or pleural effusion is noted. Right internal jugular Port-A-Cath is unchanged in position. Hyperexpansion of the lungs is noted. The visualized skeletal structures are unremarkable. IMPRESSION: No active cardiopulmonary disease. Electronically Signed   By: Marijo Conception, M.D.   On: 10/03/2017 10:20   Ct Angio Chest Pe W And/or Wo Contrast  Result Date: 10/03/2017 CLINICAL DATA:  History of  lymphoma. There a tract infection. Short of breath. Hypoxia EXAM: CT ANGIOGRAPHY CHEST WITH CONTRAST TECHNIQUE: Multidetector CT imaging of the chest was performed using the standard protocol during  bolus administration of intravenous contrast. Multiplanar CT image reconstructions and MIPs were obtained to evaluate the vascular anatomy. CONTRAST:  80 mL Isovue COMPARISON:  None. FINDINGS: Cardiovascular: There 2 small tubular filling defects within the proximal LEFT lower lobe pulmonary artery and proximal lingular pulmonary artery (image 61, series 5). These are consistent with thromboemboli. These are nonocclusive. No additional filling defects identified within the pulmonary arteries bold No acute findings aorta great vessels. Heart is normal. No evidence of RIGHT ventricular strain with the RIGHT ventricular to LEFT ventricular diameter ratio less than 1. Mediastinum/Nodes: No axillary or supraclavicular adenopathy. No mediastinal hilar adenopathy. No pericardial fluid. Esophagus normal Lungs/Pleura: Within the LEFT lower lobe there is patchy nodular airspace disease involving the superior segment and the basilar segments. There is some consolidation of basilar segments. There is frothy material within the lower lobe bronchi (image 76, series 8). RIGHT lung is clear. Upper Abdomen: Limited view of the liver, kidneys, pancreas are unremarkable. Normal adrenal glands. Musculoskeletal: Again demonstrated lytic and expansile lesions within the posterior LEFT ribs and LEFT scapula (image 45, series 5 and image 56, series 5). Findings consistent bone metastasis previously described. Review of the MIP images confirms the above findings. IMPRESSION: 1. Acute pulmonary embolism within the LEFT lower lobe and lingula. These small emboli are nonocclusive. Overall clot burden is minimal. 2. No evidence of RIGHT ventricular strain. 3. Patchy airspace disease in the LEFT lower lobe. Favor pulmonary infection such as pneumonia  or aspiration rather than pulmonary infarction. 4. Frothy material within the LEFT lower lobe bronchus could represent aspirate from tracheal aspiration. Findings conveyed toNATHAN Ayoub Arey on 10/03/2017  at12:55. Electronically Signed   By: Suzy Bouchard M.D.   On: 10/03/2017 13:00    Procedures Procedures (including critical care time)  Medications Ordered in ED Medications  ceFEPIme (MAXIPIME) 2 g in dextrose 5 % 50 mL IVPB (2 g Intravenous New Bag/Given 10/03/17 1323)  vancomycin (VANCOCIN) IVPB 1000 mg/200 mL premix (not administered)  sodium chloride 0.9 % bolus 1,000 mL (0 mLs Intravenous Stopped 10/03/17 1221)  iopamidol (ISOVUE-370) 76 % injection 100 mL (100 mLs Intravenous Contrast Given 10/03/17 1155)     Initial Impression / Assessment and Plan / ED Course  I have reviewed the triage vital signs and the nursing notes.  Pertinent labs & imaging results that were available during my care of the patient were reviewed by me and considered in my medical decision making (see chart for details).     Patient brought in after reportedly being mistakenly brought into the cancer center.  Has chronic Foley and chronic nephrostomy tube.  Has had cough and generalized weakness.  Found to be newly hypoxic.  Has small pulmonary embolisms on CTA.  Also appears dehydrated with questionable urinary tract infection.  Pneumonia also showed up on CTA.  Will treat for H CAP.  Admit to hospitalist.  Final Clinical Impressions(s) / ED Diagnoses   Final diagnoses:  B-cell lymphoma, unspecified B-cell lymphoma type, unspecified body region [facility])  Other acute pulmonary embolism without acute cor pulmonale ([place])  Healthcare-associated pneumonia  Hypoxia  Dehydration    New Prescriptions New Prescriptions   No medications on file     Davonna Belling, MD 10/03/17 1312  Discussed with oncology, Dr. Alen Blew, who will see the patient in consult in the hospital.    Davonna Belling,  MD 10/03/17 1327

## 2017-10-03 NOTE — Consult Note (Signed)
Urology Consult  Referring physician: Florencia Reasons Reason for referral: Sepsis  Chief Complaint: Sepsis with nephrostomy tube  History of Present Illness: The patient has an impacted right ureter stone and a wire could not be passed by Dr. [name] last month.  He had 1 follow up visit here and was not felt to be a surgical candidate at that time.  It was recommended that he would follow up with [place]. The patient is not having flank pain or cystitis symptoms Treatment ongoing for lymphoma  Modifying factors: There are no other modifying factors  Associated signs and symptoms: There are no other associated signs and symptoms Aggravating and relieving factors: There are no other aggravating or relieving factors Severity: Moderate Duration: Persistent     Past Medical History:  Diagnosis Date  . Cancer ([place])   . Tendonitis    Past Surgical History:  Procedure Laterality Date  . CYSTOSCOPY W/ URETERAL STENT PLACEMENT Right 08/21/2017   Procedure: CYSTOSCOPY WITH RETROGRADE PYELOGRAM, RIGHT;  Surgeon: Alexis Frock, MD;  Location: [facility];  Service: Urology;  Laterality: Right;  . HERNIA REPAIR  1988   Dr. Vida Rigger  . IR FLUORO GUIDE PORT INSERTION RIGHT  07/26/2017  . IR NEPHROSTOMY PLACEMENT RIGHT  08/21/2017  . IR US GUIDE VASC ACCESS RIGHT  07/26/2017    Medications: I have reviewed the patient's current medications. Allergies:  Allergies  Allergen Reactions  . Bee Venom Swelling    Family History  Problem Relation Age of Onset  . Cancer Father    Social History:  reports that he has never smoked. He has never used smokeless tobacco. He reports that he drinks alcohol. He reports that he does not use drugs.  ROS: All systems are reviewed and negative except as noted. Rest negative  Physical Exam:  Vital signs in last 24 hours: Temp:  [98.7 F (37.1 C)] 98.7 F (37.1 C) (10/31 0859) Pulse Rate:  [96-114] 96 (10/31 1533) Resp:  [16-18] 16 (10/31 1533) BP: (96-117)/(54-87)  100/60 (10/31 1533) SpO2:  [86 %-100 %] 100 % (10/31 1533) Weight:  [63.5 kg (140 lb)] 63.5 kg (140 lb) (10/31 0902)  Cardiovascular: Skin warm; not flushed Respiratory: Breaths quiet; no shortness of breath Abdomen: No masses Neurological: Normal sensation to touch Musculoskeletal: Normal motor function arms and legs Lymphatics: No inguinal adenopathy Skin: No rashes Genitourinary:non toxic and alert  Laboratory Data:  Results for orders placed or performed during the hospital encounter of 10/03/17 (from the past 72 hour(s))  Basic metabolic panel     Status: Abnormal   Collection Time: 10/03/17  9:30 AM  Result Value Ref Range   Sodium 139 135 - 145 mmol/L   Potassium 3.0 (L) 3.5 - 5.1 mmol/L   Chloride 103 101 - 111 mmol/L   CO2 20 (L) 22 - 32 mmol/L   Glucose, Bld 97 65 - 99 mg/dL   BUN 10 6 - 20 mg/dL   Creatinine, Ser 0.42 (L) 0.61 - 1.24 mg/dL   Calcium 6.9 (L) 8.9 - 10.3 mg/dL   GFR calc non Af Amer >60 >60 mL/min   GFR calc Af Amer >60 >60 mL/min    Comment: (NOTE) The eGFR has been calculated using the CKD EPI equation. This calculation has not been validated in all clinical situations. eGFR's persistently <60 mL/min signify possible Chronic Kidney Disease.    Anion gap 16 (H) 5 - 15  CBC     Status: Abnormal   Collection Time: 10/03/17  9:30 AM  Result Value Ref Range   WBC 3.4 (L) 4.0 - 10.5 K/uL   RBC 3.70 (L) 4.22 - 5.81 MIL/uL   Hemoglobin 9.2 (L) 13.0 - 17.0 g/dL   HCT 29.9 (L) 39.0 - 52.0 %   MCV 80.8 78.0 - 100.0 fL   MCH 24.9 (L) 26.0 - 34.0 pg   MCHC 30.8 30.0 - 36.0 g/dL   RDW 21.0 (H) 11.5 - 15.5 %   Platelets 415 (H) 150 - 400 K/uL  Magnesium     Status: None   Collection Time: 10/03/17  9:30 AM  Result Value Ref Range   Magnesium 2.0 1.7 - 2.4 mg/dL  Phosphorus     Status: Abnormal   Collection Time: 10/03/17  9:30 AM  Result Value Ref Range   Phosphorus 1.6 (L) 2.5 - 4.6 mg/dL  Hepatic function panel     Status: Abnormal   Collection  Time: 10/03/17  9:30 AM  Result Value Ref Range   Total Protein 5.9 (L) 6.5 - 8.1 g/dL   Albumin 2.8 (L) 3.5 - 5.0 g/dL   AST 19 15 - 41 U/L   ALT 31 17 - 63 U/L   Alkaline Phosphatase 158 (H) 38 - 126 U/L   Total Bilirubin 1.6 (H) 0.3 - 1.2 mg/dL   Bilirubin, Direct 0.3 0.1 - 0.5 mg/dL   Indirect Bilirubin 1.3 (H) 0.3 - 0.9 mg/dL  Differential     Status: Abnormal   Collection Time: 10/03/17  9:30 AM  Result Value Ref Range   Neutrophils Relative % 57 %   Neutro Abs 2.0 1.7 - 7.7 K/uL   Lymphocytes Relative 16 %   Lymphs Abs 0.6 (L) 0.7 - 4.0 K/uL   Monocytes Relative 27 %   Monocytes Absolute 0.9 0.1 - 1.0 K/uL   Eosinophils Relative 0 %   Eosinophils Absolute 0.0 0.0 - 0.7 K/uL   Basophils Relative 0 %   Basophils Absolute 0.0 0.0 - 0.1 K/uL  Urinalysis, Routine w reflex microscopic- may I&O cath if menses     Status: Abnormal   Collection Time: 10/03/17 12:28 PM  Result Value Ref Range   Color, Urine AMBER (A) YELLOW    Comment: BIOCHEMICALS MAY BE AFFECTED BY COLOR   APPearance CLOUDY (A) CLEAR   Specific Gravity, Urine 1.022 1.005 - 1.030   pH 5.0 5.0 - 8.0   Glucose, UA NEGATIVE NEGATIVE mg/dL   Hgb urine dipstick LARGE (A) NEGATIVE   Bilirubin Urine NEGATIVE NEGATIVE   Ketones, ur 80 (A) NEGATIVE mg/dL   Protein, ur >=300 (A) NEGATIVE mg/dL   Nitrite NEGATIVE NEGATIVE   Leukocytes, UA MODERATE (A) NEGATIVE   RBC / HPF TOO NUMEROUS TO COUNT 0 - 5 RBC/hpf   WBC, UA TOO NUMEROUS TO COUNT 0 - 5 WBC/hpf   Bacteria, UA RARE (A) NONE SEEN   Squamous Epithelial / LPF NONE SEEN NONE SEEN   WBC Clumps PRESENT    Mucus PRESENT    No results found for this or any previous visit (from the past 240 hour(s)). Creatinine:  Recent Labs  10/01/17 1407 10/03/17 0930  CREATININE 0.5* 0.42*    Xrays: See report/chart none  Impression/Assessment:  May have UTI No acute tx for stone at this time  Plan:  Recommend to change nephrostomy tube tomorrow electively- I do  not recommend antegrade attempt at stent since he could have UTI  Cory Ibarra A 10/03/2017, 3:34 PM

## 2017-10-03 NOTE — Progress Notes (Signed)
A consult was received from an ED physician for Vancomycin per pharmacy dosing.  The patient's profile has been reviewed for ht/wt/allergies/indication/available labs.   A one time order has been placed for Vancomycin 1gm IV & Cefepime increased to 2gm IV x1.  Further antibiotics/pharmacy consults should be ordered by admitting physician if indicated.                       Thank you, Biagio Borg 10/03/2017  1:16 PM

## 2017-10-03 NOTE — ED Triage Notes (Signed)
EMS reports picked op up at [facility] for transfer to cancer center for Chemo treatment, but was wrong day, diverted to ED due to possible UTI, strong smell and cloudy urine in foley bag, urinary burning BP 113/69 HR 113 Resp 16 Sa02 98 RA

## 2017-10-03 NOTE — H&P (Addendum)
History and Physical  [name] WER:[id number] DOB: [birth year] DOA: 10/03/2017  Referring physician: EDP PCP: [name], [name]   Chief Complaint: Arneta Cliche UTI /pneumonia/ PE  HPI: Cory Ibarra is a 56 y.o. male   With History of nephrostomy  Tube (palced in 08/2017), h/o diffuse large B-cell lymphoma getting chemotherapy, last a week ago.  he received inpatient dose reduced chemotherapy due to generalized weakness/FTT. He was discharged to SNF in the hope that he can get stronger , so he can received cancer treatment.   he is brought back to the hospital due to concern of UTI.   ED course:  he is found to have hypoxia on presentation. CTA showed a small PE and also pneumonia, basic lab work showed WBC 3.4 ,hemoglobin 9.2 which is close to his baseline. Potassium 3.0, BUN 10 ,creatinine 0.4, UA concerning for UTI . urine culture process in the in the ED.  he is started on antibiotics with Vanco and cefepime, Hospitalist called to admit the patient.  Review of Systems:  Detail per HPI, Review of systems are otherwise negative  Past Medical History:  Diagnosis Date  . Cancer ([place])   . Tendonitis    Past Surgical History:  Procedure Laterality Date  . CYSTOSCOPY W/ URETERAL STENT PLACEMENT Right 08/21/2017   Procedure: CYSTOSCOPY WITH RETROGRADE PYELOGRAM, RIGHT;  Surgeon: Alexis Frock, MD;  Location: [facility];  Service: Urology;  Laterality: Right;  . HERNIA REPAIR  1988   Dr. Vida Rigger  . IR FLUORO GUIDE PORT INSERTION RIGHT  07/26/2017  . IR NEPHROSTOMY PLACEMENT RIGHT  08/21/2017  . IR US GUIDE VASC ACCESS RIGHT  07/26/2017   Social History:  reports that he has never smoked. He has never used smokeless tobacco. He reports that he drinks alcohol. He reports that he does not use drugs. Patient lives at [facility] is dependent in his daily activities, he has not been able to walk for a while due to back pain and cancer bone mets.   Allergies  Allergen Reactions  . Bee Venom Swelling     Family History  Problem Relation Age of Onset  . Cancer Father       Prior to Admission medications   Medication Sig Start Date End Date Taking? Authorizing Provider  calcium-vitamin D (OSCAL WITH D) 500-200 MG-UNIT tablet Take 1 tablet by mouth 2 (two) times daily. 09/25/17 10/25/17 Yes Perlov, Marinell Blight, MD  chlorhexidine (PERIDEX) 0.12 % solution 15 mLs by Mouth Rinse route 2 (two) times daily. 09/03/17  Yes Theodis Blaze, MD  dronabinol (MARINOL) 5 MG capsule Take 1 capsule (5 mg total) by mouth daily before lunch. 09/25/17  Yes Gerlene Fee, NP  enoxaparin (LOVENOX) 40 MG/0.4ML injection Inject 0.4 mLs (40 mg total) into the skin daily. 09/25/17 10/25/17 Yes Perlov, Mikhail Darnell Level, MD  Lidocaine-Prilocaine, Bulk, 2.5-2.5 % CREA Apply to affected areas topically one time daily   Yes [provider]  LORazepam (ATIVAN) 0.5 MG tablet Take 1 tablet (0.5 mg total) by mouth every 6 (six) hours as needed (Nausea or vomiting). 09/25/17 10/09/17 Yes Perlov, Marinell Blight, MD  magnesium oxide (MAG-OX) 400 (241.3 Mg) MG tablet Take 1 tablet (400 mg total) by mouth 2 (two) times daily. 09/25/17 10/25/17 Yes Ardath Sax, MD  Multiple Vitamin (MULTIVITAMIN WITH MINERALS) TABS tablet Take 1 tablet by mouth daily. 09/25/17  Yes Perlov, Marinell Blight, MD  OLANZapine zydis (ZYPREXA) 5 MG disintegrating tablet Take 0.5 tablets (2.5 mg total) by mouth  at bedtime. 09/03/17  Yes Theodis Blaze, MD  ondansetron (ZOFRAN) 8 MG tablet Take 1 tablet (8 mg total) by mouth every 8 (eight) hours as needed for nausea or vomiting. 09/06/17 10/06/17 Yes Ardath Sax, MD  oxyCODONE (OXYCONTIN) 10 mg 12 hr tablet Take 1 tablet (10 mg total) by mouth every 12 (twelve) hours. 09/25/17 10/25/17 Yes Perlov, Marinell Blight, MD  Oxycodone HCl 10 MG TABS Take 1 tablet (10 mg total) by mouth every 4 (four) hours. Patient taking differently: Take 10 mg by mouth every 4 (four) hours as needed (for moderate to severe pain).   09/25/17  Yes Perlov, Marinell Blight, MD  pantoprazole (PROTONIX) 40 MG tablet Take 40 mg by mouth daily with lunch.    Yes [provider]  polyethylene glycol (MIRALAX / GLYCOLAX) packet Take 17 g by mouth daily as needed for mild constipation. Patient taking differently: Take 17 g by mouth daily.  07/03/17  Yes Domenic Polite, MD  potassium & sodium phosphates (PHOS-NAK) 280-160-250 MG PACK Take 1 packet by mouth 4 (four) times daily -  before meals and at bedtime. 09/25/17 10/25/17 Yes Perlov, Marinell Blight, MD  predniSONE (DELTASONE) 10 MG tablet Take 2 tablets (20 mg total) by mouth daily with breakfast. 10/01/17 10/29/17 Yes Perlov, Marinell Blight, MD  prochlorperazine (COMPAZINE) 10 MG tablet Take 1 tablet (10 mg total) by mouth every 6 (six) hours as needed for nausea or vomiting. 09/06/17 10/06/17 Yes Perlov, Marinell Blight, MD  senna-docusate (SENOKOT-S) 8.6-50 MG tablet Take 1 tablet by mouth daily as needed for mild constipation.   Yes [provider]  sulfamethoxazole-trimethoprim (BACTRIM,SEPTRA) 200-40 MG/5ML suspension Take 20 mLs by mouth every Monday, Wednesday, and Friday. Continuous   Yes [provider]  sulfamethoxazole-trimethoprim (BACTRIM DS,SEPTRA DS) 800-160 MG tablet Take 1 tablet by mouth 3 (three) times a week. Patient not taking: Reported on 10/03/2017 10/01/17   Ardath Sax, MD    Physical Exam: BP 113/87 (BP Location: Left Arm)   Pulse (!) 111   Temp 98.7 F (37.1 C) (Oral)   Resp 16   Ht 6\' 2"  (1.88 m)   Wt 63.5 kg (140 lb)   SpO2 99%   BMI 17.97 kg/m   General:  Pale and frail Eyes: PERRL ENT: unremarkable Neck: supple, no JVD Cardiovascular: sinus tachycardia Respiratory: diminished, no wheezing, no rales Abdomen: soft/ND/ND, positive bowel sounds, + right nephrostomy tube with dark odorous urine Skin: no rash Musculoskeletal:  No edema, bilateral lower extremity weakness, not able to lift against gravity Psychiatric:  calm/cooperative Neurologic: no focal findings           Labs on Admission:  Basic Metabolic Panel:  Recent Labs Lab 10/01/17 1407 10/01/17 1407 10/03/17 0930  NA 138  --  139  K 3.8  --  3.0*  CL  --   --  103  CO2 21*  --  20*  GLUCOSE 104  --  97  BUN 10.9  --  10  CREATININE 0.5*  --  0.42*  CALCIUM 7.7*  --  6.9*  MG 2.0  --  2.0  PHOS  --  2.3* 1.6*   Liver Function Tests:  Recent Labs Lab 10/01/17 1407 10/03/17 0930  AST 29 19  ALT 48 31  ALKPHOS 186* 158*  BILITOT 1.19 1.6*  PROT 6.2* 5.9*  ALBUMIN 2.6* 2.8*   No results for input(s): LIPASE, AMYLASE in the last 168 hours. No results for input(s): AMMONIA in the  last 168 hours. CBC:  Recent Labs Lab 10/01/17 1407 10/03/17 0930  WBC 1.3* 3.4*  NEUTROABS 0.7* 2.0  HGB 9.9* 9.2*  HCT 30.7* 29.9*  MCV 77.5* 80.8  PLT 366 415*   Cardiac Enzymes: No results for input(s): CKTOTAL, CKMB, CKMBINDEX, TROPONINI in the last 168 hours.  BNP (last 3 results)  Recent Labs  08/21/17 2249  BNP 607.1*    ProBNP (last 3 results) No results for input(s): PROBNP in the last 8760 hours.  CBG: No results for input(s): GLUCAP in the last 168 hours.  Radiological Exams on Admission: Dg Chest 2 View  Result Date: 10/03/2017 CLINICAL DATA:  Shortness of breath. EXAM: CHEST  2 VIEW COMPARISON:  Radiographs of August 21, 2017. FINDINGS: The heart size and mediastinal contours are within normal limits. Both lungs are clear. No pneumothorax or pleural effusion is noted. Right internal jugular Port-A-Cath is unchanged in position. Hyperexpansion of the lungs is noted. The visualized skeletal structures are unremarkable. IMPRESSION: No active cardiopulmonary disease. Electronically Signed   By: Marijo Conception, M.D.   On: 10/03/2017 10:20   Ct Angio Chest Pe W And/or Wo Contrast  Result Date: 10/03/2017 CLINICAL DATA:  History of lymphoma. There a tract infection. Short of breath. Hypoxia EXAM: CT ANGIOGRAPHY  CHEST WITH CONTRAST TECHNIQUE: Multidetector CT imaging of the chest was performed using the standard protocol during bolus administration of intravenous contrast. Multiplanar CT image reconstructions and MIPs were obtained to evaluate the vascular anatomy. CONTRAST:  80 mL Isovue COMPARISON:  None. FINDINGS: Cardiovascular: There 2 small tubular filling defects within the proximal LEFT lower lobe pulmonary artery and proximal lingular pulmonary artery (image 61, series 5). These are consistent with thromboemboli. These are nonocclusive. No additional filling defects identified within the pulmonary arteries bold No acute findings aorta great vessels. Heart is normal. No evidence of RIGHT ventricular strain with the RIGHT ventricular to LEFT ventricular diameter ratio less than 1. Mediastinum/Nodes: No axillary or supraclavicular adenopathy. No mediastinal hilar adenopathy. No pericardial fluid. Esophagus normal Lungs/Pleura: Within the LEFT lower lobe there is patchy nodular airspace disease involving the superior segment and the basilar segments. There is some consolidation of basilar segments. There is frothy material within the lower lobe bronchi (image 76, series 8). RIGHT lung is clear. Upper Abdomen: Limited view of the liver, kidneys, pancreas are unremarkable. Normal adrenal glands. Musculoskeletal: Again demonstrated lytic and expansile lesions within the posterior LEFT ribs and LEFT scapula (image 45, series 5 and image 56, series 5). Findings consistent bone metastasis previously described. Review of the MIP images confirms the above findings. IMPRESSION: 1. Acute pulmonary embolism within the LEFT lower lobe and lingula. These small emboli are nonocclusive. Overall clot burden is minimal. 2. No evidence of RIGHT ventricular strain. 3. Patchy airspace disease in the LEFT lower lobe. Favor pulmonary infection such as pneumonia or aspiration rather than pulmonary infarction. 4. Frothy material within the  LEFT lower lobe bronchus could represent aspirate from tracheal aspiration. Findings conveyed toNATHAN PICKERING on 10/03/2017  at12:55. Electronically Signed   By: Suzy Bouchard M.D.   On: 10/03/2017 13:00     Assessment/Plan Present on Admission: **None**   Acute hypoxic respiratory failure From PE/pna   on presentation 02 86% on room air .he is now oxygen supplement, O2 sat high 90s  Acute PE started on heparin drip   Sepsis/PNA/UTI: Sepsis presented on hospitalization, with sinus tachycardia, hypoxia, WBC less than 4, Pneumonia/ UTI Sepsis protocol started, I ordered Blood culture /  lactic acid/procalcitonin  Continue vanc/cefepime that was started in the ED. Continue ivf. Follow up on culture result.  Suspected adrenal insufficiency: has been on daily prednisone, will start stress dose steroids  UTI/neprhostomy tube Urology and interventional radiology consulted, will follow recommendations  Hypokalemia; replace k.  DLBCL: counts stable. Oncology consulted.  FTT: patient reports has not been able to ambulate due to bond mets.  Severe malnutrition: nutrition consult  Over all poor prognosis  DVT prophylaxis: started on heparin drip (PE protocol)  Consultants:  Oncology Dr [name] IR for nephrostomy tube management urology  Code Status: partial, yes to cpr/defibrilator, no intubation.  Family Communication:  Patient   Disposition Plan: admit to med tele  Time spent: 41, case discussed with urology/oncology and interventional radiology.  Reymundo Winship MD, PhD [facility] Pager [phone] If 7PM-7AM, please contact night-coverage at [email or web address], password [facility]

## 2017-10-03 NOTE — ED Notes (Signed)
ED TO INPATIENT HANDOFF REPORT  Name/Age/Gender Cory Ibarra 56 y.o. male  Code Status Code Status History    Date Active Date Inactive Code Status Order ID Comments User Context   09/17/2017  5:57 PM 09/25/2017  9:21 PM Partial Code [id number]  Ardath Sax, MD Inpatient   08/21/2017  8:19 PM 09/03/2017  4:46 PM Partial Code [id number]  Omar Person, NP Inpatient   08/21/2017  5:18 PM 08/21/2017  8:19 PM Full Code [id number]  Doreatha Lew, MD Inpatient   06/28/2017  2:21 PM 07/03/2017  7:43 PM Full Code [id number]  Phillips Grout, MD ED    Questions for Most Recent Historical Code Status (Order [id number])    Question Answer Comment   In the event of cardiac or respiratory ARREST: Initiate Code Blue, Call Rapid Response Yes    In the event of cardiac or respiratory ARREST: Perform CPR No    In the event of cardiac or respiratory ARREST: Perform Intubation/Mechanical Ventilation Yes    In the event of cardiac or respiratory ARREST: Use NIPPV/BiPAp only if indicated Yes    In the event of cardiac or respiratory ARREST: Administer ACLS medications if indicated Yes    In the event of cardiac or respiratory ARREST: Perform Defibrillation or Cardioversion if indicated Yes         Advance Directive Documentation     Most Recent Value  Type of Advance Directive  Out of facility DNR (pink MOST or yellow form)  Pre-existing out of facility DNR order (yellow form or pink MOST form)  Physician notified to receive inpatient order  "MOST" Form in Place?  -      Home/SNF/Other Home  Chief Complaint UTI  Level of Care/Admitting Diagnosis ED Disposition    ED Disposition Condition [place]: [place] [[id number]]  Level of Care: Telemetry [5]  Admit to tele based on following criteria: Complex arrhythmia (Bradycardia/Tachycardia)  Diagnosis: Pulmonary emboli [facility]) [[id number]]  Admitting Physician: Florencia Reasons [[id number]]  Attending Physician: Florencia Reasons [[id number]]  Estimated length of stay: past midnight tomorrow  Certification:: I certify this patient will need inpatient services for at least 2 midnights  PT Class (Do Not Modify): Inpatient [101]  PT Acc Code (Do Not Modify): Private [1]       Medical History Past Medical History:  Diagnosis Date  . Cancer ([place])   . Tendonitis     Allergies Allergies  Allergen Reactions  . Bee Venom Swelling    IV Location/Drains/Wounds Patient Lines/Drains/Airways Status   Active Line/Drains/Airways    Name:   Placement date:   Placement time:   Site:   Days:   Implanted Port 07/26/17  07/26/17            69   Closed System Drain 1 Right Back Other (Comment) 10 Fr.  08/21/17    2218    Back    43   Urethral Catheter DR.MANNY Latex 16 Fr.  08/21/17    2012    Latex    43   Urethral Catheter Nikki, RN  Non-latex;Straight-tip 16 Fr.  10/03/17    2023    Non-latex;Straight-tip    less than 1   Ureteral Drain/Stent 6 Fr.                 Incision (Closed) 06/29/17 Chest Right;Lower  06/29/17    1531      96   Incision (Closed) 07/26/17 Chest  Right;Upper  07/26/17    1344      69   Wound / Incision (Open or Dehisced) 09/18/17 Sacrum Mid  09/18/17    0900    Sacrum    15          Labs/Imaging Results for orders placed or performed during the hospital encounter of 10/03/17 (from the past 48 hour(s))  Basic metabolic panel     Status: Abnormal   Collection Time: 10/03/17  9:30 AM  Result Value Ref Range   Sodium 139 135 - 145 mmol/L   Potassium 3.0 (L) 3.5 - 5.1 mmol/L   Chloride 103 101 - 111 mmol/L   CO2 20 (L) 22 - 32 mmol/L   Glucose, Bld 97 65 - 99 mg/dL   BUN 10 6 - 20 mg/dL   Creatinine, Ser 0.42 (L) 0.61 - 1.24 mg/dL   Calcium 6.9 (L) 8.9 - 10.3 mg/dL   GFR calc non Af Amer >60 >60 mL/min   GFR calc Af Amer >60 >60 mL/min    Comment: (NOTE) The eGFR has been calculated using the CKD EPI equation. This calculation has not been validated in all clinical situations. eGFR's  persistently <60 mL/min signify possible Chronic Kidney Disease.    Anion gap 16 (H) 5 - 15  CBC     Status: Abnormal   Collection Time: 10/03/17  9:30 AM  Result Value Ref Range   WBC 3.4 (L) 4.0 - 10.5 K/uL   RBC 3.70 (L) 4.22 - 5.81 MIL/uL   Hemoglobin 9.2 (L) 13.0 - 17.0 g/dL   HCT 29.9 (L) 39.0 - 52.0 %   MCV 80.8 78.0 - 100.0 fL   MCH 24.9 (L) 26.0 - 34.0 pg   MCHC 30.8 30.0 - 36.0 g/dL   RDW 21.0 (H) 11.5 - 15.5 %   Platelets 415 (H) 150 - 400 K/uL  Magnesium     Status: None   Collection Time: 10/03/17  9:30 AM  Result Value Ref Range   Magnesium 2.0 1.7 - 2.4 mg/dL  Phosphorus     Status: Abnormal   Collection Time: 10/03/17  9:30 AM  Result Value Ref Range   Phosphorus 1.6 (L) 2.5 - 4.6 mg/dL  Hepatic function panel     Status: Abnormal   Collection Time: 10/03/17  9:30 AM  Result Value Ref Range   Total Protein 5.9 (L) 6.5 - 8.1 g/dL   Albumin 2.8 (L) 3.5 - 5.0 g/dL   AST 19 15 - 41 U/L   ALT 31 17 - 63 U/L   Alkaline Phosphatase 158 (H) 38 - 126 U/L   Total Bilirubin 1.6 (H) 0.3 - 1.2 mg/dL   Bilirubin, Direct 0.3 0.1 - 0.5 mg/dL   Indirect Bilirubin 1.3 (H) 0.3 - 0.9 mg/dL  Differential     Status: Abnormal   Collection Time: 10/03/17  9:30 AM  Result Value Ref Range   Neutrophils Relative % 57 %   Neutro Abs 2.0 1.7 - 7.7 K/uL   Lymphocytes Relative 16 %   Lymphs Abs 0.6 (L) 0.7 - 4.0 K/uL   Monocytes Relative 27 %   Monocytes Absolute 0.9 0.1 - 1.0 K/uL   Eosinophils Relative 0 %   Eosinophils Absolute 0.0 0.0 - 0.7 K/uL   Basophils Relative 0 %   Basophils Absolute 0.0 0.0 - 0.1 K/uL  Urinalysis, Routine w reflex microscopic- may I&O cath if menses     Status: Abnormal   Collection Time: 10/03/17 12:28 PM  Result Value Ref Range   Color, Urine AMBER (A) YELLOW    Comment: BIOCHEMICALS MAY BE AFFECTED BY COLOR   APPearance CLOUDY (A) CLEAR   Specific Gravity, Urine 1.022 1.005 - 1.030   pH 5.0 5.0 - 8.0   Glucose, UA NEGATIVE NEGATIVE mg/dL    Hgb urine dipstick LARGE (A) NEGATIVE   Bilirubin Urine NEGATIVE NEGATIVE   Ketones, ur 80 (A) NEGATIVE mg/dL   Protein, ur >=300 (A) NEGATIVE mg/dL   Nitrite NEGATIVE NEGATIVE   Leukocytes, UA MODERATE (A) NEGATIVE   RBC / HPF TOO NUMEROUS TO COUNT 0 - 5 RBC/hpf   WBC, UA TOO NUMEROUS TO COUNT 0 - 5 WBC/hpf   Bacteria, UA RARE (A) NONE SEEN   Squamous Epithelial / LPF NONE SEEN NONE SEEN   WBC Clumps PRESENT    Mucus PRESENT    Dg Chest 2 View  Result Date: 10/03/2017 CLINICAL DATA:  Shortness of breath. EXAM: CHEST  2 VIEW COMPARISON:  Radiographs of August 21, 2017. FINDINGS: The heart size and mediastinal contours are within normal limits. Both lungs are clear. No pneumothorax or pleural effusion is noted. Right internal jugular Port-A-Cath is unchanged in position. Hyperexpansion of the lungs is noted. The visualized skeletal structures are unremarkable. IMPRESSION: No active cardiopulmonary disease. Electronically Signed   By: Marijo Conception, M.D.   On: 10/03/2017 10:20   Ct Angio Chest Pe W And/or Wo Contrast  Result Date: 10/03/2017 CLINICAL DATA:  History of lymphoma. There a tract infection. Short of breath. Hypoxia EXAM: CT ANGIOGRAPHY CHEST WITH CONTRAST TECHNIQUE: Multidetector CT imaging of the chest was performed using the standard protocol during bolus administration of intravenous contrast. Multiplanar CT image reconstructions and MIPs were obtained to evaluate the vascular anatomy. CONTRAST:  80 mL Isovue COMPARISON:  None. FINDINGS: Cardiovascular: There 2 small tubular filling defects within the proximal LEFT lower lobe pulmonary artery and proximal lingular pulmonary artery (image 61, series 5). These are consistent with thromboemboli. These are nonocclusive. No additional filling defects identified within the pulmonary arteries bold No acute findings aorta great vessels. Heart is normal. No evidence of RIGHT ventricular strain with the RIGHT ventricular to LEFT  ventricular diameter ratio less than 1. Mediastinum/Nodes: No axillary or supraclavicular adenopathy. No mediastinal hilar adenopathy. No pericardial fluid. Esophagus normal Lungs/Pleura: Within the LEFT lower lobe there is patchy nodular airspace disease involving the superior segment and the basilar segments. There is some consolidation of basilar segments. There is frothy material within the lower lobe bronchi (image 76, series 8). RIGHT lung is clear. Upper Abdomen: Limited view of the liver, kidneys, pancreas are unremarkable. Normal adrenal glands. Musculoskeletal: Again demonstrated lytic and expansile lesions within the posterior LEFT ribs and LEFT scapula (image 45, series 5 and image 56, series 5). Findings consistent bone metastasis previously described. Review of the MIP images confirms the above findings. IMPRESSION: 1. Acute pulmonary embolism within the LEFT lower lobe and lingula. These small emboli are nonocclusive. Overall clot burden is minimal. 2. No evidence of RIGHT ventricular strain. 3. Patchy airspace disease in the LEFT lower lobe. Favor pulmonary infection such as pneumonia or aspiration rather than pulmonary infarction. 4. Frothy material within the LEFT lower lobe bronchus could represent aspirate from tracheal aspiration. Findings conveyed toNATHAN PICKERING on 10/03/2017  at12:55. Electronically Signed   By: Suzy Bouchard M.D.   On: 10/03/2017 13:00    Pending Labs [employer]     Ordered   10/04/17  0500  CBC  Daily,   R     10/03/17 1431   10/03/17 2100  Heparin level (unfractionated)  Once-Timed,   R     10/03/17 1431   10/03/17 0911  Urine C&S  STAT,   STAT    Question:  Patient immune status  Answer:  Immunocompromised   10/03/17 0911      Vitals/Pain Today's Vitals   10/03/17 1108 10/03/17 1225 10/03/17 1324 10/03/17 1420  BP: 96/62 105/63 113/87 (!) 97/54  Pulse: (!) 105 (!) 101 (!) 111 98  Resp: _0 Temp:      TempSrc:      SpO2:  97% 100% 99% 99%  Weight:      Height:        Isolation Precautions No active isolations  Medications Medications  heparin bolus via infusion 2,000 Units (not administered)  heparin ADULT infusion 100 units/mL (25000 units/269m sodium chloride 0.45%) (not administered)  sodium chloride 0.9 % bolus 1,000 mL (0 mLs Intravenous Stopped 10/03/17 1221)  iopamidol (ISOVUE-370) 76 % injection 100 mL (100 mLs Intravenous Contrast Given 10/03/17 1155)  ceFEPIme (MAXIPIME) 2 g in dextrose 5 % 50 mL IVPB (0 g Intravenous Stopped 10/03/17 1419)  vancomycin (VANCOCIN) IVPB 1000 mg/200 mL premix (1,000 mg Intravenous New Bag/Given 10/03/17 1419)    Mobility walks with person assist

## 2017-10-03 NOTE — ED Notes (Signed)
Patient transported to X-ray 

## 2017-10-03 NOTE — ED Notes (Signed)
Call report to [place] at [phone] at [facility]

## 2017-10-03 NOTE — Progress Notes (Addendum)
ANTICOAGULATION CONSULT NOTE - Initial Consult  Pharmacy Consult for IV heparin Indication: pulmonary embolus  Allergies  Allergen Reactions  . Bee Venom Swelling    Patient Measurements: Height: 6\' 2"  (188 cm) Weight: 140 lb (63.5 kg) IBW/kg (Calculated) : 82.2 Heparin Dosing Weight: 63.5  Vital Signs: Temp: 98.7 F (37.1 C) (10/31 0859) Temp Source: Oral (10/31 0859) BP: 97/54 (10/31 1420) Pulse Rate: 98 (10/31 1420)  Labs:  Recent Labs  10/01/17 1407 10/01/17 1407 10/03/17 0930  HGB 9.9*  --  9.2*  HCT 30.7*  --  29.9*  PLT 366  --  415*  CREATININE  --  0.5* 0.42*    Estimated Creatinine Clearance: 90.4 mL/min (A) (by C-G formula based on SCr of 0.42 mg/dL (L)).   Medical History: Past Medical History:  Diagnosis Date  . Cancer ([place])   . Tendonitis     Medications:  Scheduled:   Infusions:  . vancomycin 1,000 mg (10/03/17 1419)    Assessment: 56 yo male with NHL that just recently had C1 of RCHOP on 10/16 presented to ER now to start IV heparin for new PE,. Baseline labs drawn. Note was on Lovenox 40mg  daily PTA with last dose being 10/30 at 10am  Goal of Therapy:  Heparin level 0.3-0.7 units/ml Monitor platelets by anticoagulation protocol: Yes   Plan:  1) IV heparin 2000 unit bolus then 2) IV heparin 1100 units/hr 3) Check heparin level 6 hours after start of IV heparin 4) Daily CBC and heparin level   Adrian Saran, PharmD, BCPS Pager [phone] 10/03/2017 2:30 PM

## 2017-10-04 ENCOUNTER — Other Ambulatory Visit: Payer: Self-pay

## 2017-10-04 ENCOUNTER — Encounter (HOSPITAL_COMMUNITY): Payer: Self-pay | Admitting: Diagnostic Radiology

## 2017-10-04 ENCOUNTER — Ambulatory Visit: Payer: Self-pay

## 2017-10-04 ENCOUNTER — Inpatient Hospital Stay (HOSPITAL_COMMUNITY): Payer: Medicaid Other

## 2017-10-04 DIAGNOSIS — C8510 Unspecified B-cell lymphoma, unspecified site: Secondary | ICD-10-CM

## 2017-10-04 DIAGNOSIS — R0902 Hypoxemia: Secondary | ICD-10-CM

## 2017-10-04 DIAGNOSIS — E860 Dehydration: Secondary | ICD-10-CM

## 2017-10-04 DIAGNOSIS — L8995 Pressure ulcer of unspecified site, unstageable: Secondary | ICD-10-CM | POA: Insufficient documentation

## 2017-10-04 DIAGNOSIS — N139 Obstructive and reflux uropathy, unspecified: Secondary | ICD-10-CM

## 2017-10-04 HISTORY — PX: IR NEPHROSTOMY EXCHANGE RIGHT: IMG6070

## 2017-10-04 LAB — COMPREHENSIVE METABOLIC PANEL
ALBUMIN: 2.2 g/dL — AB (ref 3.5–5.0)
ALK PHOS: 126 U/L (ref 38–126)
ALT: 23 U/L (ref 17–63)
AST: 14 U/L — ABNORMAL LOW (ref 15–41)
Anion gap: 10 (ref 5–15)
BUN: 9 mg/dL (ref 6–20)
CALCIUM: 6 mg/dL — AB (ref 8.9–10.3)
CO2: 23 mmol/L (ref 22–32)
CREATININE: 0.34 mg/dL — AB (ref 0.61–1.24)
Chloride: 107 mmol/L (ref 101–111)
GFR calc Af Amer: >60 mL/min (ref >60)
GFR calc non Af Amer: >60 mL/min (ref >60)
GLUCOSE: 112 mg/dL — AB (ref 65–99)
Potassium: 3.5 mmol/L (ref 3.5–5.1)
SODIUM: 140 mmol/L (ref 135–145)
Total Bilirubin: 1.1 mg/dL (ref 0.3–1.2)
Total Protein: 5.1 g/dL — ABNORMAL LOW (ref 6.5–8.1)

## 2017-10-04 LAB — CBC
HCT: 24.9 % — ABNORMAL LOW (ref 39.0–52.0)
Hemoglobin: 7.7 g/dL — ABNORMAL LOW (ref 13.0–17.0)
MCH: 25.3 pg — ABNORMAL LOW (ref 26.0–34.0)
MCHC: 30.9 g/dL (ref 30.0–36.0)
MCV: 81.9 fL (ref 78.0–100.0)
Platelets: 345 10*3/uL (ref 150–400)
RBC: 3.04 MIL/uL — AB (ref 4.22–5.81)
RDW: 21.1 % — ABNORMAL HIGH (ref 11.5–15.5)
WBC: 2.4 10*3/uL — ABNORMAL LOW (ref 4.0–10.5)

## 2017-10-04 LAB — HEPARIN LEVEL (UNFRACTIONATED)
HEPARIN UNFRACTIONATED: 1.01 [IU]/mL — AB (ref 0.30–0.70)
Heparin Unfractionated: <0.1 IU/mL — ABNORMAL LOW (ref 0.30–0.70)
Heparin Unfractionated: 1 IU/mL — ABNORMAL HIGH (ref 0.30–0.70)

## 2017-10-04 MED ORDER — IOPAMIDOL (ISOVUE-300) INJECTION 61%
INTRAVENOUS | Status: AC
  Administered 2017-10-04: 5 mL
  Filled 2017-10-04: qty 50

## 2017-10-04 MED ORDER — HEPARIN BOLUS VIA INFUSION
2000.0000 [IU] | Freq: Once | INTRAVENOUS | Status: AC
  Administered 2017-10-04: 2000 [IU] via INTRAVENOUS
  Filled 2017-10-04: qty 2000

## 2017-10-04 MED ORDER — HYDROCORTISONE NA SUCCINATE PF 100 MG IJ SOLR
50.0000 mg | Freq: Two times a day (BID) | INTRAMUSCULAR | Status: DC
  Administered 2017-10-05 – 2017-10-08 (×7): 50 mg via INTRAVENOUS
  Filled 2017-10-04 (×7): qty 2

## 2017-10-04 MED ORDER — HEPARIN (PORCINE) IN NACL 100-0.45 UNIT/ML-% IJ SOLN
1350.0000 [IU]/h | INTRAMUSCULAR | Status: DC
  Administered 2017-10-04: 1350 [IU]/h via INTRAVENOUS
  Filled 2017-10-04: qty 250

## 2017-10-04 MED ORDER — HEPARIN (PORCINE) IN NACL 100-0.45 UNIT/ML-% IJ SOLN: 1350.0000 [IU]/h | INTRAMUSCULAR | Status: DC

## 2017-10-04 MED ORDER — SODIUM CHLORIDE 0.9 % IV SOLN
2.0000 g | Freq: Once | INTRAVENOUS | Status: AC
  Administered 2017-10-04: 2 g via INTRAVENOUS
  Filled 2017-10-04: qty 20

## 2017-10-04 MED ORDER — LIDOCAINE HCL 1 % IJ SOLN
INTRAMUSCULAR | Status: AC
  Filled 2017-10-04: qty 20

## 2017-10-04 MED ORDER — IOPAMIDOL (ISOVUE-300) INJECTION 61%: 50.0000 mL | Freq: Once | INTRAVENOUS | Status: DC | PRN

## 2017-10-04 MED ORDER — HEPARIN (PORCINE) IN NACL 100-0.45 UNIT/ML-% IJ SOLN
1600.0000 [IU]/h | INTRAMUSCULAR | Status: DC
  Administered 2017-10-04: 1600 [IU]/h via INTRAVENOUS

## 2017-10-04 NOTE — Care Management Note (Signed)
Case Management Note  Patient Details  Name: Cory Ibarra MRN: [id number] Date of Birth: 10/15/1961  Subjective/Objective: 56 y/o m admitted w/PE, PNA. FTT. From SNF-CSW already following.                 Action/Plan:d/c plan SNF.   Expected Discharge Date:   (unknown)               Expected Discharge Plan:  Skilled Nursing Facility  In-House Referral:  Clinical Social Work  Discharge planning Services  CM Consult  Post Acute Care Choice:    Choice offered to:     DME Arranged:    DME Agency:     HH Arranged:    [place] Agency:     Status of Service:  In process, will continue to follow  If discussed at Long Length of Stay Meetings, dates discussed:    Additional Comments:  Dessa Phi, RN 10/04/2017, 11:19 AM

## 2017-10-04 NOTE — Progress Notes (Signed)
Pharmacy: Re- heparin  Farouk Vivero is a 56 y.o. 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  Chest CTA showed acute LLL PE with no evidence of right heart strain.  Heparin drip started on admission for PE.  - second heparin level now back undetectable x2 - per RN, no issues with IV line - no bleeding documented  Plan: - heparin 2000 units IV x1 bolus, then increase drip to 1600 units/hr - check 6 hr heparin level - monitor for s/s bleeding  Dorrene German 10/04/2017 6:07 AM

## 2017-10-04 NOTE — Progress Notes (Addendum)
ANTICOAGULATION CONSULT NOTE - Follow Up Consult  Pharmacy Consult for Heparin Indication: new pulmonary embolus  Allergies  Allergen Reactions  . Bee Venom Swelling   Patient Measurements: Height: 6\' 2"  (188 cm) Weight: 145 lb 8.1 oz (66 kg) IBW/kg (Calculated) : 82.2 Heparin Dosing Weight: 66 kg  Vital Signs: Temp: 97.8 F (36.6 C) (11/01 0617) Temp Source: Oral (11/01 0617) BP: 103/60 (11/01 0617) Pulse Rate: 82 (11/01 0617)  Labs:  Recent Labs  10/03/17 0930 10/03/17 1914 10/04/17 0450  HGB 9.2*  --  7.7*  HCT 29.9*  --  24.9*  PLT 415*  --  345  APTT  --  40*  --   LABPROT  --  15.6*  --   INR  --  1.25  --   HEPARINUNFRC  --  <0.10* <0.10*  CREATININE 0.42*  --  0.34*   Estimated Creatinine Clearance: 94 mL/min (A) (by C-G formula based on SCr of 0.34 mg/dL (L)).  Assessment: [name] with history of B-cell lymphoma currently under treatment at the cancer center presented on 10/03/17 with c/o UTI. Chest CTA showed acute LLL PE with no evidence of right heart strain. Heparin drip started on admission for PE. Pharmacy consulted for heparin dosing.   10/04/2017 - Heparin level pending. Infusion running at appropriate rate of 1600 units/hr. IV heparin running through port, confirmed with phlebotomy that level drawn peripherally. - Patient has received Heparin boluses of 2000 units x3  - Hgb decreased from 9.2 to 7.7. Plt decreased as well. Discussed with MD, believed to be dilutional as patient was on sepsis protocol and received IVF.  - No bleeding complications per discussion with RN.  - Scr wnl and stable.  - 1230 HL drawn peripherally at 1330, resulted 1545, HL = 1.0, valid level  Goal of Therapy:  Heparin level 0.3-0.7 units/ml Monitor platelets by anticoagulation protocol: Yes   Plan:  - Hold Heparin x 1 hr, reduce Heparin infusion rate to 1350 units/hr - follow up level in 6 hr, after Heparin restart - Monitor daily heparin level (when at steady  state) and CBC - Monitor signs and symptoms of bleeding - f/u plan for long term anticoagulation.  Richmond Campbell 10/04/2017,2:35 PM   Minda Ditto PharmD Pager [phone] 10/04/2017, 3:55 PM

## 2017-10-04 NOTE — Progress Notes (Signed)
CRITICAL VALUE ALERT  Critical Value:  Calcium 6.0  Date & Time Notied: 10/04/17;0638  Provider Notified:yes  Orders Received/Actions taken:Awaiting new orders.

## 2017-10-04 NOTE — Procedures (Signed)
Successful exchange of right nephrostomy tube.  No immediate complication and no bleeding.  Right ureter stone is present.  Stone is partially obstructive with delayed emptying into distal ureter.

## 2017-10-04 NOTE — Progress Notes (Signed)
Pharmacy gave orders to increase the Heparin Infusion to 16 units.

## 2017-10-04 NOTE — Progress Notes (Signed)
PROGRESS NOTE  Cory Ibarra  WVP:[id number] DOB: 1961/06/24 DOA: 10/03/2017 PCP: [name], [name]  Outpatient Specialists: Oncology, [name] Brief Narrative: Cory Ibarra is a 56 y.o. male with a history of DLBCL undergoing chemotherapy and right ureterolithiasis with nephrostomy tube who presented 10/31 from SNF due to concern for UTI with fever though he was found to be hypoxic in the ED and CTA demonstrated a small PE with infiltrate. WBC was 3.4, hgb 9.2 near baseline. UA with nitrite positive pyuria. Vancomycin and cefepime were started. He had been failing to thrive while undergoing chemotherapy causing him to require SNF. Oncology has been giving reduced doses of chemotherapy.   Assessment & Plan: Active Problems:   Pulmonary emboli (HCC)   B-cell lymphoma ([place])   Healthcare-associated pneumonia   Obstructive uropathy   Pressure injury of skin   Dehydration   Hypoxia  Acute hypoxic respiratory failure: Due to PE and pneumonia.  - Continue oxygen prn and treat as below  Acute NI:[id number] within the LLL and lingula with minimal clot burden no evidence of RV strain. LEFT lower lobe and lingula. - Transition heparin gtt > DOAC after 24 hours (planned 11/2).   Sepsis due to LLL pneumonia and UTI: - Urine culture with 50k GNR's, plan to treat with complications of nephrostomy tube, urolithiasis and immunocompromise.  - Monitor blood cultures, continue empiric vancomycin (MRSA PCR positive) and cefepime.   Adrenal insufficiency: Empiric Dx due to chronic prednisone 10mg  daily.  - Continue stress-dose steroids  Ureterolithiasis: Stone in proximal/mid right ureter with incomplete obstruction evidenced by delayed emptying of contrast distal to this stone seen during nephrostomy drain exchange 11/1.  - Continue nephrostomy tube, definitive management outside scope of current illness per urology.   Hypokalemia: Recurrent - Check BMP, Mg, Phos in AM and replace as  indicated  Hypocalcemia: Ca 6.0 (corrects to 7.4 w/hypoalbuminemia) - Give 2g now, recheck ionized Ca in AM  DLBCL: counts stable.  - Oncology consulted.   FTT: - PT eval, anticipate DC back to SNF   Severe protein calorie malnutrition:  - Nutrition consulted  DVT prophylaxis: Heparin gtt Code Status: Partial Family Communication: None at bedside Disposition Plan: Anticipate DC to SNF once improved.   Consultants:   IR  Oncology  Procedures:   Nephrostomy exchange 11/1  Antimicrobials:  Vanc/cefepime 10/31 >>    Subjective: No significant shortness of breath at present. No chest pain. No fever currently.   Objective: Vitals:   10/03/17 2141 10/04/17 0551 10/04/17 0617 10/04/17 1500  BP: (!) 97/51  103/60 112/61  Pulse: 87  82   Resp: 18  18 18   Temp: 98.2 F (36.8 C)  97.8 F (36.6 C) 97.8 F (36.6 C)  TempSrc: Oral  Oral Axillary  SpO2: 100%  98% 98%  Weight:  66 kg (145 lb 8.1 oz)    Height:  6\' 2"  (1.88 m)      Intake/Output Summary (Last 24 hours) at 10/04/17 1621 Last data filed at 10/04/17 1256  Gross per 24 hour  Intake          2976.22 ml  Output              625 ml  Net          2351.22 ml   Filed Weights   10/03/17 0900 10/03/17 0902 10/04/17 0551  Weight: 63.5 kg (140 lb) 63.5 kg (140 lb) 66 kg (145 lb 8.1 oz)    Gen: 56 y.o. male in no  distress Pulm: Non-labored breathing. Clear to auscultation bilaterally, but decreased at left base.  CV: Regular rate and rhythm. No murmur, rub, or gallop. No JVD, no pedal edema. GI: Abdomen soft, non-tender, non-distended, with normoactive bowel sounds. No organomegaly or masses felt. Ext: Warm, no deformities Skin: No rashes, lesions no ulcers Neuro: Alert and oriented. No focal neurological deficits. Psych: Judgement and insight appear normal. Mood & affect appropriate.   Data Reviewed: I have personally reviewed following labs and imaging studies  CBC:  Recent Labs Lab 10/01/17 1407  10/03/17 0930 10/04/17 0450  WBC 1.3* 3.4* 2.4*  NEUTROABS 0.7* 2.0  --   HGB 9.9* 9.2* 7.7*  HCT 30.7* 29.9* 24.9*  MCV 77.5* 80.8 81.9  PLT 366 415* 465   Basic Metabolic Panel:  Recent Labs Lab 10/01/17 1407 10/01/17 1407 10/03/17 0930 10/04/17 0450  NA 138  --  139 140  K 3.8  --  3.0* 3.5  CL  --   --  103 107  CO2 21*  --  20* 23  GLUCOSE 104  --  97 112*  BUN 10.9  --  10 9  CREATININE 0.5*  --  0.42* 0.34*  CALCIUM 7.7*  --  6.9* 6.0*  MG 2.0  --  2.0  --   PHOS  --  2.3* 1.6*  --    GFR: Estimated Creatinine Clearance: 94 mL/min (A) (by C-G formula based on SCr of 0.34 mg/dL (L)). Liver Function Tests:  Recent Labs Lab 10/01/17 1407 10/03/17 0930 10/04/17 0450  AST 29 19 14*  ALT 48 31 23  ALKPHOS 186* 158* 126  BILITOT 1.19 1.6* 1.1  PROT 6.2* 5.9* 5.1*  ALBUMIN 2.6* 2.8* 2.2*   No results for input(s): LIPASE, AMYLASE in the last 168 hours. No results for input(s): AMMONIA in the last 168 hours. Coagulation Profile:  Recent Labs Lab 10/03/17 1914  INR 1.25   Cardiac Enzymes: No results for input(s): CKTOTAL, CKMB, CKMBINDEX, TROPONINI in the last 168 hours. BNP (last 3 results) No results for input(s): PROBNP in the last 8760 hours. HbA1C: No results for input(s): HGBA1C in the last 72 hours. CBG: No results for input(s): GLUCAP in the last 168 hours. Lipid Profile: No results for input(s): CHOL, HDL, LDLCALC, TRIG, CHOLHDL, LDLDIRECT in the last 72 hours. Thyroid Function Tests: No results for input(s): TSH, T4TOTAL, FREET4, T3FREE, THYROIDAB in the last 72 hours. Anemia Panel: No results for input(s): VITAMINB12, FOLATE, FERRITIN, TIBC, IRON, RETICCTPCT in the last 72 hours. Urine analysis:    Component Value Date/Time   COLORURINE AMBER (A) 10/03/2017 1228   APPEARANCEUR CLOUDY (A) 10/03/2017 1228   LABSPEC 1.022 10/03/2017 1228   PHURINE 5.0 10/03/2017 1228   GLUCOSEU NEGATIVE 10/03/2017 1228   HGBUR LARGE (A) 10/03/2017 1228    BILIRUBINUR NEGATIVE 10/03/2017 1228   KETONESUR 80 (A) 10/03/2017 1228   PROTEINUR >=300 (A) 10/03/2017 1228   UROBILINOGEN 1.0 07/06/2017 1029   NITRITE NEGATIVE 10/03/2017 1228   LEUKOCYTESUR MODERATE (A) 10/03/2017 1228   Recent Results (from the past 240 hour(s))  Urine C&S     Status: Abnormal (Preliminary result)   Collection Time: 10/03/17 12:28 PM  Result Value Ref Range Status   Specimen Description URINE, CATHETERIZED  Final   Special Requests Immunocompromised  Final   Culture 50,000 COLONIES/mL GRAM NEGATIVE RODS (A)  Final   Report Status PENDING  Incomplete  Culture, blood (routine x 2)     Status: None (Preliminary result)  Collection Time: 10/03/17  4:50 PM  Result Value Ref Range Status   Specimen Description BLOOD LEFT ANTECUBITAL  Final   Special Requests   Final    BOTTLES DRAWN AEROBIC ONLY Blood Culture adequate volume   Culture   Final    NO GROWTH < 24 HOURS Performed at [facility], [address], [place], [place] [address]    Report Status PENDING  Incomplete  Culture, blood (routine x 2)     Status: None (Preliminary result)   Collection Time: 10/03/17  4:50 PM  Result Value Ref Range Status   Specimen Description BLOOD RIGHT ANTECUBITAL  Final   Special Requests   Final    BOTTLES DRAWN AEROBIC ONLY Blood Culture adequate volume   Culture   Final    NO GROWTH < 24 HOURS Performed at [facility], [place] [address], [place], [place] [address]    Report Status PENDING  Incomplete  MRSA PCR Screening     Status: Abnormal   Collection Time: 10/03/17  5:03 PM  Result Value Ref Range Status   MRSA by PCR POSITIVE (A) NEGATIVE Final    Comment:        The GeneXpert MRSA Assay (FDA approved for NASAL specimens only), is one component of a comprehensive MRSA colonization surveillance program. It is not intended to diagnose MRSA infection nor to guide or monitor treatment for MRSA infections. RESULT CALLED TO, READ BACK BY AND  VERIFIED WITH: Carlis Stable [id number] @ 2109 BY J SCOTTON       Radiology Studies: Dg Chest 2 View  Result Date: 10/03/2017 CLINICAL DATA:  Shortness of breath. EXAM: CHEST  2 VIEW COMPARISON:  Radiographs of August 21, 2017. FINDINGS: The heart size and mediastinal contours are within normal limits. Both lungs are clear. No pneumothorax or pleural effusion is noted. Right internal jugular Port-A-Cath is unchanged in position. Hyperexpansion of the lungs is noted. The visualized skeletal structures are unremarkable. IMPRESSION: No active cardiopulmonary disease. Electronically Signed   By: Marijo Conception, M.D.   On: 10/03/2017 10:20   Ct Angio Chest Pe W And/or Wo Contrast  Result Date: 10/03/2017 CLINICAL DATA:  History of lymphoma. There a tract infection. Short of breath. Hypoxia EXAM: CT ANGIOGRAPHY CHEST WITH CONTRAST TECHNIQUE: Multidetector CT imaging of the chest was performed using the standard protocol during bolus administration of intravenous contrast. Multiplanar CT image reconstructions and MIPs were obtained to evaluate the vascular anatomy. CONTRAST:  80 mL Isovue COMPARISON:  None. FINDINGS: Cardiovascular: There 2 small tubular filling defects within the proximal LEFT lower lobe pulmonary artery and proximal lingular pulmonary artery (image 61, series 5). These are consistent with thromboemboli. These are nonocclusive. No additional filling defects identified within the pulmonary arteries bold No acute findings aorta great vessels. Heart is normal. No evidence of RIGHT ventricular strain with the RIGHT ventricular to LEFT ventricular diameter ratio less than 1. Mediastinum/Nodes: No axillary or supraclavicular adenopathy. No mediastinal hilar adenopathy. No pericardial fluid. Esophagus normal Lungs/Pleura: Within the LEFT lower lobe there is patchy nodular airspace disease involving the superior segment and the basilar segments. There is some consolidation of basilar segments.  There is frothy material within the lower lobe bronchi (image 76, series 8). RIGHT lung is clear. Upper Abdomen: Limited view of the liver, kidneys, pancreas are unremarkable. Normal adrenal glands. Musculoskeletal: Again demonstrated lytic and expansile lesions within the posterior LEFT ribs and LEFT scapula (image 45, series 5 and image 56, series  5). Findings consistent bone metastasis previously described. Review of the MIP images confirms the above findings. IMPRESSION: 1. Acute pulmonary embolism within the LEFT lower lobe and lingula. These small emboli are nonocclusive. Overall clot burden is minimal. 2. No evidence of RIGHT ventricular strain. 3. Patchy airspace disease in the LEFT lower lobe. Favor pulmonary infection such as pneumonia or aspiration rather than pulmonary infarction. 4. Frothy material within the LEFT lower lobe bronchus could represent aspirate from tracheal aspiration. Findings conveyed toNATHAN PICKERING on 10/03/2017  at12:55. Electronically Signed   By: Suzy Bouchard M.D.   On: 10/03/2017 13:00   Ir Nephrostomy Exchange Right  Result Date: 10/04/2017 INDICATION: 56 year old with a right ureter stone and right percutaneous nephrostomy. Concern for infection. Request for a drain exchange. EXAM: RIGHT NEPHROSTOMY TUBE EXCHANGE WITH FLUOROSCOPY Physician: Stephan Minister. Anselm Pancoast, MD COMPARISON:  None. MEDICATIONS: None ANESTHESIA/SEDATION: None CONTRAST:  17mL ISOVUE-300 IOPAMIDOL (ISOVUE-300) INJECTION 61% - administered into the collecting system(s) FLUOROSCOPY TIME:  Fluoroscopy Time: 30 seconds, 6 mGy COMPLICATIONS: None immediate. PROCEDURE: The procedure was explained to the patient. The risks and benefits of the procedure were discussed and the patient's questions were addressed. Informed consent was obtained from the patient. Right flank was prepped and draped in sterile fashion. Maximal barrier sterile technique was utilized including caps, mask, sterile gowns, sterile gloves, sterile  drape, hand hygiene and skin antiseptic. Contrast was injected through the nephrostomy tube. Catheter was cut and removed over a Bentson wire. New 10.2 [place] multipurpose drain was reconstituted in the renal pelvis. Contrast was injected to confirm placement. Skin was anesthetized with 1% lidocaine. Catheter was sutured to skin with Prolene suture. Fluoroscopic images were taken and saved for this procedure. FINDINGS: Nephrostomy tube is well positioned within the right renal pelvis. Again noted is a stone in the proximal/mid right ureter. Delayed emptying of contrast distal to this stone. IMPRESSION: Successful exchange of the right percutaneous nephrostomy tube with fluoroscopy. Electronically Signed   By: Markus Daft M.D.   On: 10/04/2017 09:20    Scheduled Meds: . chlorhexidine  15 mL Mouth Rinse BID  . Chlorhexidine Gluconate Cloth  6 each Topical Q0600  . dronabinol  5 mg Oral QAC lunch  . hydrocortisone sod succinate (SOLU-CORTEF) inj  50 mg Intravenous Q8H  . lidocaine      . magnesium oxide  400 mg Oral BID  . mupirocin ointment  1 application Nasal BID  . OLANZapine zydis  2.5 mg Oral QHS  . oxyCODONE  10 mg Oral Q12H  . pantoprazole  40 mg Oral Q lunch  . polyethylene glycol  17 g Oral Daily  . potassium & sodium phosphates  1 packet Oral TID AC & HS   Continuous Infusions: . sodium chloride 75 mL/hr at 10/04/17 0504  . ceFEPime (MAXIPIME) IV Stopped (10/04/17 1600)  . heparin    . vancomycin Stopped (10/04/17 1236)     LOS: 1 day   Time spent: 25 minutes.  Vance Gather, MD [facility] Pager [phone]  If 7PM-7AM, please contact night-coverage [email or web address] Password [email or web address] 10/04/2017, 4:21 PM

## 2017-10-04 NOTE — Progress Notes (Signed)
IP PROGRESS NOTE  Subjective:  Patient was afebrile overnight. O2 sats have improved significantly, no recurrent desaturations. Receiving heparin drip and antibiotics, underwent percutaneous nephrostomy tube replacement this morning. He reports feeling significantly better compared to yesterday. Denies any new complaints. Appetite has improved and patient is looking forward to eating breakfast.  Objective: Vital signs in last 24 hours: Blood pressure 103/60, pulse 82, temperature 97.8 F (36.6 C), temperature source Oral, resp. rate 18, height 6\' 2"  (1.88 m), weight 145 lb 8.1 oz (66 kg), SpO2 98 %.  Intake/Output from previous day: 10/31 0701 - 11/01 0700 In: 3906.2 [P.O.:240; I.V.:916.2; IV Piggyback:2750] Out: 625 [Urine:175; Drains:450]  Physical Exam:  Cachectic, alert, awake oriented 3. HEENT: Anicteric sclera, moist mucous membranes. Lungs: Clear to auscultation bilaterally. Cardiac: S1/S2, regular, no murmurs. Abdomen: Soft, nontender, nondistended. Bowel sounds are normoactive. Extremities: No lower extremity edema  Portacath/PICC-without erythema  Lab Results:  Recent Labs  10/03/17 0930 10/04/17 0450  WBC 3.4* 2.4*  HGB 9.2* 7.7*  HCT 29.9* 24.9*  PLT 415* 345    BMET  Recent Labs  10/03/17 0930 10/04/17 0450  NA 139 140  K 3.0* 3.5  CL 103 107  CO2 20* 23  GLUCOSE 97 112*  BUN 10 9  CREATININE 0.42* 0.34*  CALCIUM 6.9* 6.0*    Lab Results  Component Value Date   CEA1 0.7 06/28/2017    Studies/Results: Dg Chest 2 View  Result Date: 10/03/2017 CLINICAL DATA:  Shortness of breath. EXAM: CHEST  2 VIEW COMPARISON:  Radiographs of August 21, 2017. FINDINGS: The heart size and mediastinal contours are within normal limits. Both lungs are clear. No pneumothorax or pleural effusion is noted. Right internal jugular Port-A-Cath is unchanged in position. Hyperexpansion of the lungs is noted. The visualized skeletal structures are unremarkable.  IMPRESSION: No active cardiopulmonary disease. Electronically Signed   By: Marijo Conception, M.D.   On: 10/03/2017 10:20   Ct Angio Chest Pe W And/or Wo Contrast  Result Date: 10/03/2017 CLINICAL DATA:  History of lymphoma. There a tract infection. Short of breath. Hypoxia EXAM: CT ANGIOGRAPHY CHEST WITH CONTRAST TECHNIQUE: Multidetector CT imaging of the chest was performed using the standard protocol during bolus administration of intravenous contrast. Multiplanar CT image reconstructions and MIPs were obtained to evaluate the vascular anatomy. CONTRAST:  80 mL Isovue COMPARISON:  None. FINDINGS: Cardiovascular: There 2 small tubular filling defects within the proximal LEFT lower lobe pulmonary artery and proximal lingular pulmonary artery (image 61, series 5). These are consistent with thromboemboli. These are nonocclusive. No additional filling defects identified within the pulmonary arteries bold No acute findings aorta great vessels. Heart is normal. No evidence of RIGHT ventricular strain with the RIGHT ventricular to LEFT ventricular diameter ratio less than 1. Mediastinum/Nodes: No axillary or supraclavicular adenopathy. No mediastinal hilar adenopathy. No pericardial fluid. Esophagus normal Lungs/Pleura: Within the LEFT lower lobe there is patchy nodular airspace disease involving the superior segment and the basilar segments. There is some consolidation of basilar segments. There is frothy material within the lower lobe bronchi (image 76, series 8). RIGHT lung is clear. Upper Abdomen: Limited view of the liver, kidneys, pancreas are unremarkable. Normal adrenal glands. Musculoskeletal: Again demonstrated lytic and expansile lesions within the posterior LEFT ribs and LEFT scapula (image 45, series 5 and image 56, series 5). Findings consistent bone metastasis previously described. Review of the MIP images confirms the above findings. IMPRESSION: 1. Acute pulmonary embolism within the LEFT lower lobe  and lingula.  These small emboli are nonocclusive. Overall clot burden is minimal. 2. No evidence of RIGHT ventricular strain. 3. Patchy airspace disease in the LEFT lower lobe. Favor pulmonary infection such as pneumonia or aspiration rather than pulmonary infarction. 4. Frothy material within the LEFT lower lobe bronchus could represent aspirate from tracheal aspiration. Findings conveyed toNATHAN PICKERING on 10/03/2017  at12:55. Electronically Signed   By: Suzy Bouchard M.D.   On: 10/03/2017 13:00   Ir Nephrostomy Exchange Right  Result Date: 10/04/2017 INDICATION: 56 year old with a right ureter stone and right percutaneous nephrostomy. Concern for infection. Request for a drain exchange. EXAM: RIGHT NEPHROSTOMY TUBE EXCHANGE WITH FLUOROSCOPY Physician: Stephan Minister. Anselm Pancoast, MD COMPARISON:  None. MEDICATIONS: None ANESTHESIA/SEDATION: None CONTRAST:  59mL ISOVUE-300 IOPAMIDOL (ISOVUE-300) INJECTION 61% - administered into the collecting system(s) FLUOROSCOPY TIME:  Fluoroscopy Time: 30 seconds, 6 mGy COMPLICATIONS: None immediate. PROCEDURE: The procedure was explained to the patient. The risks and benefits of the procedure were discussed and the patient's questions were addressed. Informed consent was obtained from the patient. Right flank was prepped and draped in sterile fashion. Maximal barrier sterile technique was utilized including caps, mask, sterile gowns, sterile gloves, sterile drape, hand hygiene and skin antiseptic. Contrast was injected through the nephrostomy tube. Catheter was cut and removed over a Bentson wire. New 10.2 [place] multipurpose drain was reconstituted in the renal pelvis. Contrast was injected to confirm placement. Skin was anesthetized with 1% lidocaine. Catheter was sutured to skin with Prolene suture. Fluoroscopic images were taken and saved for this procedure. FINDINGS: Nephrostomy tube is well positioned within the right renal pelvis. Again noted is a stone in the proximal/mid  right ureter. Delayed emptying of contrast distal to this stone. IMPRESSION: Successful exchange of the right percutaneous nephrostomy tube with fluoroscopy. Electronically Signed   By: Markus Daft M.D.   On: 10/04/2017 09:20    Medications: I have reviewed the patient's current medications.  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 Currently admitted for PE, pneumonia and possible recurrent UTI.  Patient was started on heparin drip and underwent percutaneous nephrostomy tube replacement today. The replacement proceeded without complications, it appears that the ureters now patent with contrast reaching urinary bladder based on preliminary report by radiology. There has been some difficulty getting heparin drip up to therapeutic level. This is not unusual in the setting of acute thrombosis where heparin is consumed by the recent active clot.  Recommendations: --Continue heparin drip without level adjustment based on afX, later, recommend transition to apixaban or rivaroxaban --Continue current empiric antibiotic choice to be tailored later as the sensitivities become available. Recommend at least 72 hours of parenteral antibiotics --Continue stress dose steroids for the time being. We will need to taper as he reacts poorly to abrupt discontinuation of steroid support therapy. --Patient is prone to electrolyte abnormalities, recommend aggressive replacement -- will defer treatment response assessment until clinical situation stabilized or patient is discharged. It is unlikely that they will proceed with next round of chemotherapy while he is in the hospital.    LOS: 1 day   Ardath Sax, MD   10/04/2017, 9:41 AM

## 2017-10-05 LAB — BASIC METABOLIC PANEL
Anion gap: 8 (ref 5–15)
BUN: 9 mg/dL (ref 6–20)
CO2: 24 mmol/L (ref 22–32)
Calcium: 6.4 mg/dL — CL (ref 8.9–10.3)
Chloride: 108 mmol/L (ref 101–111)
Creatinine, Ser: 0.36 mg/dL — ABNORMAL LOW (ref 0.61–1.24)
GFR calc non Af Amer: >60 mL/min (ref >60)
Glucose, Bld: 92 mg/dL (ref 65–99)
POTASSIUM: 3 mmol/L — AB (ref 3.5–5.1)
Sodium: 140 mmol/L (ref 135–145)

## 2017-10-05 LAB — CBC
HEMATOCRIT: 24.6 % — AB (ref 39.0–52.0)
Hemoglobin: 7.8 g/dL — ABNORMAL LOW (ref 13.0–17.0)
MCH: 25.6 pg — ABNORMAL LOW (ref 26.0–34.0)
MCHC: 31.7 g/dL (ref 30.0–36.0)
MCV: 80.7 fL (ref 78.0–100.0)
PLATELETS: 329 10*3/uL (ref 150–400)
RBC: 3.05 MIL/uL — ABNORMAL LOW (ref 4.22–5.81)
RDW: 20.8 % — AB (ref 11.5–15.5)
WBC: 2.9 10*3/uL — ABNORMAL LOW (ref 4.0–10.5)

## 2017-10-05 LAB — HEPARIN LEVEL (UNFRACTIONATED): HEPARIN UNFRACTIONATED: 0.83 [IU]/mL — AB (ref 0.30–0.70)

## 2017-10-05 LAB — URINE CULTURE: Culture: 50000 — AB

## 2017-10-05 LAB — MAGNESIUM: Magnesium: 1.7 mg/dL (ref 1.7–2.4)

## 2017-10-05 LAB — PHOSPHORUS: PHOSPHORUS: 1.8 mg/dL — AB (ref 2.5–4.6)

## 2017-10-05 MED ORDER — POTASSIUM CHLORIDE CRYS ER 20 MEQ PO TBCR
40.0000 meq | EXTENDED_RELEASE_TABLET | Freq: Two times a day (BID) | ORAL | Status: DC
  Administered 2017-10-05 (×2): 40 meq via ORAL
  Filled 2017-10-05 (×2): qty 2

## 2017-10-05 MED ORDER — HEPARIN (PORCINE) IN NACL 100-0.45 UNIT/ML-% IJ SOLN
1100.0000 [IU]/h | INTRAMUSCULAR | Status: DC
  Administered 2017-10-05: 1100 [IU]/h via INTRAVENOUS

## 2017-10-05 MED ORDER — APIXABAN 5 MG PO TABS: 5.0000 mg | ORAL_TABLET | Freq: Two times a day (BID) | ORAL | Status: DC

## 2017-10-05 MED ORDER — APIXABAN 5 MG PO TABS
10.0000 mg | ORAL_TABLET | Freq: Two times a day (BID) | ORAL | Status: DC
  Administered 2017-10-05 – 2017-10-08 (×7): 10 mg via ORAL
  Filled 2017-10-05 (×7): qty 2

## 2017-10-05 MED ORDER — HEPARIN (PORCINE) IN NACL 100-0.45 UNIT/ML-% IJ SOLN: 950.0000 [IU]/h | INTRAMUSCULAR | Status: DC

## 2017-10-05 MED ORDER — ENSURE ENLIVE PO LIQD
237.0000 mL | Freq: Two times a day (BID) | ORAL | Status: DC
  Administered 2017-10-05: 237 mL via ORAL

## 2017-10-05 NOTE — Evaluation (Signed)
Physical Therapy Evaluation Patient Details Name: Cory Ibarra MRN: [id number] DOB: 1961-03-30 Today's Date: 10/05/2017   History of Present Illness  56 y.o. 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  Pt currently admitted for PE, pneumonia and possible recurrent UTI and underwent percutaneous nephrostomy tube replacement 10/04/17  Clinical Impression  Pt admitted with above diagnosis. Pt currently with functional limitations due to the deficits listed below (see PT Problem List).  Pt will benefit from skilled PT to increase their independence and safety with mobility to allow discharge to the venue listed below.  Pt requiring assist for mobility however prefers to d/c home with his brother. Pt states he has been at SNF (CSW reports resident?) for rehab for a few weeks.  Pt fatigues very quickly and presents with generalized weakness however very happy to be getting OOB today.  Recommend d/c to SNF.    Follow Up Recommendations SNF;Supervision/Assistance - 24 hour    Equipment Recommendations  None recommended by PT    Recommendations for Other Services       Precautions / Restrictions Precautions Precautions: Fall Precaution Comments: dizziness with sitting/standing, R PCN      Mobility  Bed Mobility Overal bed mobility: Needs Assistance Bed Mobility: Supine to Sit     Supine to sit: Min guard;HOB elevated     General bed mobility comments: pt performed as able, min/guard for safety/lines, very happy to be sitting EOB  Transfers Overall transfer level: Needs assistance Equipment used: Rolling walker (2 wheeled) Transfers: Sit to/from Stand Sit to Stand: From elevated surface;Mod assist         General transfer comment: verbal cues for hand placement and technique, difficulty with rise requiring assist as well as assist to  steady  Ambulation/Gait Ambulation/Gait assistance: Min assist Ambulation Distance (Feet): 5 Feet Assistive device: Rolling walker (2 wheeled) Gait Pattern/deviations: Step-through pattern;Decreased stride length;Narrow base of support     General Gait Details: pt able to take a few steps forwards then realized fatigue so took a few steps backwards to recliner, SpO2 100% on room air, HR 129 bpm   Stairs            Wheelchair Mobility    Modified Rankin (Stroke Patients Only)       Balance Overall balance assessment: Needs assistance Sitting-balance support: Feet supported Sitting balance-Leahy Scale: Good     Standing balance support: Bilateral upper extremity supported Standing balance-Leahy Scale: Poor Standing balance comment: relies on BUE support                             Pertinent Vitals/Pain Pain Assessment: 0-10 Pain Score: 5  Pain Location: back Pain Descriptors / Indicators: Sore Pain Intervention(s): Monitored during session;Limited activity within patient's tolerance;Repositioned    Home Living Family/patient expects to be discharged to:: [name]: [facility] - 2 wheels Additional Comments: Per chart review, decline in mobility since end of July this year with multiple admissions, has been in SNF but pt states he wishes to return home with his brother    Prior Function Level of Independence: Needs assistance   Gait / Transfers Assistance Needed: pt reports ability to ambulate with assistive device prior to recent admission however currently at SNF and mostly transfers to/from w/c with staff  Hand Dominance        Extremity/Trunk Assessment   Upper Extremity Assessment Upper Extremity Assessment: Generalized weakness    Lower Extremity Assessment Lower Extremity Assessment: Generalized weakness       Communication   Communication: No difficulties  Cognition  Arousal/Alertness: Awake/alert Behavior During Therapy: WFL for tasks assessed/performed Overall Cognitive Status: Within Functional Limits for tasks assessed                                 General Comments: pleasant and cooperative, decreased insight into medical diagnoses and functional abilities      General Comments      Exercises     Assessment/Plan    PT Assessment Patient needs continued PT services  PT Problem List Decreased strength;Decreased activity tolerance;Decreased mobility;Decreased knowledge of use of DME       PT Treatment Interventions DME instruction;Gait training;Therapeutic exercise;Therapeutic activities;Patient/family education;Functional mobility training;Balance training    PT Goals (Current goals can be found in the Care Plan section)  Acute Rehab PT Goals PT Goal Formulation: With patient Time For Goal Achievement: 10/19/17 Potential to Achieve Goals: Fair    Frequency Min 3X/week   Barriers to discharge        Co-evaluation               AM-PAC PT "6 Clicks" Daily Activity  Outcome Measure Difficulty turning over in bed (including adjusting bedclothes, sheets and blankets)?: A Lot Difficulty moving from lying on back to sitting on the side of the bed? : Unable Difficulty sitting down on and standing up from a chair with arms (Ibarra.g., wheelchair, bedside commode, etc,.)?: Unable Help needed moving to and from a bed to chair (including a wheelchair)?: A Lot Help needed walking in hospital room?: Total Help needed climbing 3-5 steps with a railing? : Total 6 Click Score: 8    End of Session Equipment Utilized During Treatment: Gait belt Activity Tolerance: Patient limited by fatigue Patient left: in chair;with call bell/phone within reach Nurse Communication: Mobility status (recommended +2 or stedy, pt agreeable to call for assist back to bed) PT Visit Diagnosis: Muscle weakness (generalized) (M62.81);Difficulty in  walking, not elsewhere classified (R26.2)    Time: 6301-6010 PT Time Calculation (min) (ACUTE ONLY): 30 min   Charges:   PT Evaluation $PT Eval Moderate Complexity: 1 Mod     PT G Codes:        Cory Ibarra, PT, DPT 10/05/2017 Pager: [phone]  Cory Ibarra 10/05/2017, 3:03 PM

## 2017-10-05 NOTE — Progress Notes (Signed)
CRITICAL VALUE ALERT  Critical Value:  Calcium 6.4  Date & Time Notied: 10/05/17@ 0524  Provider Notified:YES  Orders Received/Actions taken:Awaiting new orders.

## 2017-10-05 NOTE — Progress Notes (Signed)
ANTICOAGULATION CONSULT NOTE - Follow Up Consult  Pharmacy Consult for Heparin to Eliquis Indication: new pulmonary embolus  Allergies  Allergen Reactions  . Bee Venom Swelling    Patient Measurements: Height: 6\' 2"  (188 cm) Weight: 145 lb 8.1 oz (66 kg) IBW/kg (Calculated) : 82.2 Heparin Dosing Weight: 66 kg  Vital Signs: Temp: 98.3 F (36.8 C) (11/02 0439) Temp Source: Oral (11/02 0439) BP: 107/56 (11/02 0439) Pulse Rate: 78 (11/02 0439)  Labs:  Recent Labs  10/03/17 0930  10/03/17 1914 10/04/17 0450 10/04/17 1322 10/04/17 2307 10/05/17 0434 10/05/17 0633  HGB 9.2*  --   --  7.7*  --   --  7.8*  --   HCT 29.9*  --   --  24.9*  --   --  24.6*  --   PLT 415*  --   --  345  --   --  329  --   APTT  --   --  40*  --   --   --   --   --   LABPROT  --   --  15.6*  --   --   --   --   --   INR  --   --  1.25  --   --   --   --   --   HEPARINUNFRC  --   < > <0.10* <0.10* 1.00* 1.01*  --  0.83*  CREATININE 0.42*  --   --  0.34*  --   --  0.36*  --   < > = values in this interval not displayed.  Estimated Creatinine Clearance: 94 mL/min (A) (by C-G formula based on SCr of 0.36 mg/dL (L)).   Assessment: [name] with history of B-cell lymphoma currently under treatment at the cancer center presented on 10/03/17 with c/o UTI. Chest CTA showed acute LLL PE with no evidence of right heart strain. Heparin drip started on admission for PE. Pharmacy consulted for heparin dosing.  10/05/2017 - heparin level supratherapeutic (0.83) on 1100 units/hr. Confirmed appropriate rate with RN. Heparin running in port an level drawn peripherally so should be an accurate level. - Heparin rate was decreased to 950 units/hr.  - Hgb and plt stable, no documented signs of bleeding - Scr stable - No drug-drug interactions noted with Eliquis.  Goal of Therapy:  Heparin level 0.3-0.7 units/ml Monitor platelets by anticoagulation protocol: Yes   Plan:  - Stop IV heparin  - Start Eliquis  10 mg PO twice daily x7 days followed by 5 mg PO twice daily - Stop IV heparin at the same time as first dose of Eliquis - Monitor CBC at least every 72 hours  - Monitor for signs and symptoms of bleeding and thrombosis.  - Will provide patient with education regarding Eliquis therapy  Richmond Campbell 10/05/2017,11:08 AM

## 2017-10-05 NOTE — Progress Notes (Signed)
ANTICOAGULATION CONSULT NOTE - Follow Up Consult  Pharmacy Consult for Heparin Indication: new pulmonary embolus  Allergies  Allergen Reactions  . Bee Venom Swelling   Patient Measurements: Height: 6\' 2"  (188 cm) Weight: 145 lb 8.1 oz (66 kg) IBW/kg (Calculated) : 82.2 Heparin Dosing Weight: 66 kg  Vital Signs: Temp: 98 F (36.7 C) (11/01 2048) Temp Source: Oral (11/01 2048) BP: 110/53 (11/01 2048) Pulse Rate: 85 (11/01 2048)  Labs:  Recent Labs  10/03/17 0930  10/03/17 1914 10/04/17 0450 10/04/17 1322 10/04/17 2307  HGB 9.2*  --   --  7.7*  --   --   HCT 29.9*  --   --  24.9*  --   --   PLT 415*  --   --  345  --   --   APTT  --   --  40*  --   --   --   LABPROT  --   --  15.6*  --   --   --   INR  --   --  1.25  --   --   --   HEPARINUNFRC  --   < > <0.10* <0.10* 1.00* 1.01*  CREATININE 0.42*  --   --  0.34*  --   --   < > = values in this interval not displayed. Estimated Creatinine Clearance: 94 mL/min (A) (by C-G formula based on SCr of 0.34 mg/dL (L)).  Assessment: [name] with history of B-cell lymphoma currently under treatment at the cancer center presented on 10/03/17 with c/o UTI. Chest CTA showed acute LLL PE with no evidence of right heart strain. Heparin drip started on admission for PE. Pharmacy consulted for heparin dosing.   10/05/2017 - Heparin level pending. Infusion running at appropriate rate of 1600 units/hr. IV heparin running through port, confirmed with phlebotomy that level drawn peripherally. - Patient has received Heparin boluses of 2000 units x3  - Hgb decreased from 9.2 to 7.7. Plt decreased as well. Discussed with MD, believed to be dilutional as patient was on sepsis protocol and received IVF.  - No bleeding complications per discussion with RN.  - Scr wnl and stable.  - 2307 HL =1.01, above goal, heparin running in port and HL draw peripherally so should be accurate level.   - no bleeding or infusion issues reported by RN Goal  of Therapy:  Heparin level 0.3-0.7 units/ml Monitor platelets by anticoagulation protocol: Yes   Plan:  - Reduce heparin drip to 1100 units/hr - follow up level in 6 hr - Monitor daily heparin level (when at steady state) and CBC - Monitor signs and symptoms of bleeding - f/u plan for long term anticoagulation.  Lawana Pai R 10/05/2017,12:32 AM

## 2017-10-05 NOTE — Progress Notes (Signed)
IP PROGRESS NOTE  Subjective:  Patient was afebrile overnight. O2 sats remains stable, no recurrent desaturations. Reports feeling better overall. Significant improvement in appetite yesterday, but due to procedure did not work with physical therapy and did not get out of bed and to the chair. Plans on doing this today.  Objective: Vital signs in last 24 hours: Blood pressure (!) 107/56, pulse 78, temperature 98.3 F (36.8 C), temperature source Oral, resp. rate 20, height 6\' 2"  (1.88 m), weight 145 lb 8.1 oz (66 kg), SpO2 100 %.  Intake/Output from previous day: 11/01 0701 - 11/02 0700 In: 1220 [P.O.:240; I.V.:310; IV Piggyback:670] Out: 24 [Urine:420; Drains:150]  Physical Exam:  Cachectic, alert, awake oriented 3. HEENT: Anicteric sclera, moist mucous membranes. Lungs: Clear to auscultation bilaterally. Cardiac: S1/S2, regular, no murmurs. Abdomen: Soft, nontender, nondistended. Bowel sounds are normoactive. Extremities: No lower extremity edema  Portacath/PICC-without erythema  Lab Results:  Recent Labs  10/04/17 0450 10/05/17 0434  WBC 2.4* 2.9*  HGB 7.7* 7.8*  HCT 24.9* 24.6*  PLT 345 329    BMET  Recent Labs  10/04/17 0450 10/05/17 0434  NA 140 140  K 3.5 3.0*  CL 107 108  CO2 23 24  GLUCOSE 112* 92  BUN 9 9  CREATININE 0.34* 0.36*  CALCIUM 6.0* 6.4*    Lab Results  Component Value Date   CEA1 0.7 06/28/2017    Studies/Results: Dg Chest 2 View  Result Date: 10/03/2017 CLINICAL DATA:  Shortness of breath. EXAM: CHEST  2 VIEW COMPARISON:  Radiographs of August 21, 2017. FINDINGS: The heart size and mediastinal contours are within normal limits. Both lungs are clear. No pneumothorax or pleural effusion is noted. Right internal jugular Port-A-Cath is unchanged in position. Hyperexpansion of the lungs is noted. The visualized skeletal structures are unremarkable. IMPRESSION: No active cardiopulmonary disease. Electronically Signed   By: Marijo Conception, M.D.   On: 10/03/2017 10:20   Ct Angio Chest Pe W And/or Wo Contrast  Result Date: 10/03/2017 CLINICAL DATA:  History of lymphoma. There a tract infection. Short of breath. Hypoxia EXAM: CT ANGIOGRAPHY CHEST WITH CONTRAST TECHNIQUE: Multidetector CT imaging of the chest was performed using the standard protocol during bolus administration of intravenous contrast. Multiplanar CT image reconstructions and MIPs were obtained to evaluate the vascular anatomy. CONTRAST:  80 mL Isovue COMPARISON:  None. FINDINGS: Cardiovascular: There 2 small tubular filling defects within the proximal LEFT lower lobe pulmonary artery and proximal lingular pulmonary artery (image 61, series 5). These are consistent with thromboemboli. These are nonocclusive. No additional filling defects identified within the pulmonary arteries bold No acute findings aorta great vessels. Heart is normal. No evidence of RIGHT ventricular strain with the RIGHT ventricular to LEFT ventricular diameter ratio less than 1. Mediastinum/Nodes: No axillary or supraclavicular adenopathy. No mediastinal hilar adenopathy. No pericardial fluid. Esophagus normal Lungs/Pleura: Within the LEFT lower lobe there is patchy nodular airspace disease involving the superior segment and the basilar segments. There is some consolidation of basilar segments. There is frothy material within the lower lobe bronchi (image 76, series 8). RIGHT lung is clear. Upper Abdomen: Limited view of the liver, kidneys, pancreas are unremarkable. Normal adrenal glands. Musculoskeletal: Again demonstrated lytic and expansile lesions within the posterior LEFT ribs and LEFT scapula (image 45, series 5 and image 56, series 5). Findings consistent bone metastasis previously described. Review of the MIP images confirms the above findings. IMPRESSION: 1. Acute pulmonary embolism within the LEFT lower lobe and lingula. These  small emboli are nonocclusive. Overall clot burden is minimal. 2.  No evidence of RIGHT ventricular strain. 3. Patchy airspace disease in the LEFT lower lobe. Favor pulmonary infection such as pneumonia or aspiration rather than pulmonary infarction. 4. Frothy material within the LEFT lower lobe bronchus could represent aspirate from tracheal aspiration. Findings conveyed toNATHAN Ibarra on 10/03/2017  at12:55. Electronically Signed   By: Suzy Bouchard M.D.   On: 10/03/2017 13:00   Ir Nephrostomy Exchange Right  Result Date: 10/04/2017 INDICATION: 56 year old with a right ureter stone and right percutaneous nephrostomy. Concern for infection. Request for a drain exchange. EXAM: RIGHT NEPHROSTOMY TUBE EXCHANGE WITH FLUOROSCOPY Physician: Stephan Minister. Anselm Pancoast, MD COMPARISON:  None. MEDICATIONS: None ANESTHESIA/SEDATION: None CONTRAST:  77mL ISOVUE-300 IOPAMIDOL (ISOVUE-300) INJECTION 61% - administered into the collecting system(s) FLUOROSCOPY TIME:  Fluoroscopy Time: 30 seconds, 6 mGy COMPLICATIONS: None immediate. PROCEDURE: The procedure was explained to the patient. The risks and benefits of the procedure were discussed and the patient's questions were addressed. Informed consent was obtained from the patient. Right flank was prepped and draped in sterile fashion. Maximal barrier sterile technique was utilized including caps, mask, sterile gowns, sterile gloves, sterile drape, hand hygiene and skin antiseptic. Contrast was injected through the nephrostomy tube. Catheter was cut and removed over a Bentson wire. New 10.2 [place] multipurpose drain was reconstituted in the renal pelvis. Contrast was injected to confirm placement. Skin was anesthetized with 1% lidocaine. Catheter was sutured to skin with Prolene suture. Fluoroscopic images were taken and saved for this procedure. FINDINGS: Nephrostomy tube is well positioned within the right renal pelvis. Again noted is a stone in the proximal/mid right ureter. Delayed emptying of contrast distal to this stone. IMPRESSION: Successful  exchange of the right percutaneous nephrostomy tube with fluoroscopy. Electronically Signed   By: Markus Daft M.D.   On: 10/04/2017 09:20    Medications: I have reviewed the patient's current medications.  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 Currently admitted for PE, pneumonia and possible recurrent UTI.  Patient was started on heparin drip and underwent percutaneous nephrostomy tube replacement today. The replacement proceeded without complications, it appears that the ureters now patent with contrast reaching urinary bladder based on preliminary report by radiology. There has been some difficulty getting heparin drip up to therapeutic level. This is not unusual in the setting of acute thrombosis where heparin is consumed by the recent active clot.  Recommendations: --Continue heparin drip without level adjustment based on afX, later, recommend transition to apixaban or rivaroxaban at the time of discharge --Continue current empiric antibiotic choice to be tailored later as the sensitivities become available. Recommend at least 72 hours of parenteral antibiotics --Continue stress dose steroids for the time being. We will need to taper slowly as he reacts poorly to abrupt discontinuation of steroid support therapy. --Patient is prone to electrolyte abnormalities, recommend aggressive replacement --If the patient is discharged on Monday, I will see him in my clinic one week later for possible therapy resumption --Over the weekend, our Service will be available as needed   LOS: 2 days   Ardath Sax, MD   10/05/2017, 9:32 AM

## 2017-10-05 NOTE — Progress Notes (Addendum)
PROGRESS NOTE  Cory Ibarra  AVW:[id number] DOB: 03-15-1961 DOA: 10/03/2017 PCP: [name], [name]  Outpatient Specialists: Oncology, [name] Brief Narrative: Cory Ibarra is a 56 y.o. male with a history of DLBCL undergoing chemotherapy and right ureterolithiasis with nephrostomy tube who presented 10/31 from SNF due to concern for UTI with fever though he was found to be hypoxic in the ED and CTA demonstrated a small PE with infiltrate. WBC was 3.4, hgb 9.2 near baseline. UA with nitrite positive pyuria. Vancomycin and cefepime were started. He had been failing to thrive while undergoing chemotherapy causing him to require SNF. Oncology has been giving reduced doses of chemotherapy.   Assessment & Plan: Active Problems:   Pulmonary emboli (HCC)   B-cell lymphoma ([place])   Healthcare-associated pneumonia   Obstructive uropathy   Pressure injury of skin   Dehydration   Hypoxia  Acute hypoxic respiratory failure: Due to PE and pneumonia.  - Continue oxygen prn and treat as below  Acute WG:[id number] within the LLL and lingula with minimal clot burden no evidence of RV strain. LEFT lower lobe and lingula. - Transition heparin gtt > DOAC (favor eliquis given he already takes pills on schedules multiple administrations per day)  Sepsis due to LLL pneumonia and UTI: - Urine culture with 50k GNR's, plan to treat with complications of nephrostomy tube, urolithiasis and immunocompromise.  - Oncology prefers at least 72 hours parenteral abx, continue empiric vancomycin (MRSA PCR positive) and cefepime. Will tailor to Cx data.  Adrenal insufficiency: Empiric Dx due to chronic prednisone 10mg  daily.  - Continue stress-dose steroids, will need prolonged taper.   Ureterolithiasis: Stone in proximal/mid right ureter with incomplete obstruction evidenced by delayed emptying of contrast distal to this stone seen during nephrostomy drain exchange 11/1.  - Continue nephrostomy tube, definitive  management outside scope of current illness per urology.   Hypokalemia: Recurrent, replacing per orders. - Check BMP, Mg, Phos in AM and replace as indicated  Hypocalcemia: Ca 6.4 only modestly improved with replacement yesterday.  - Give 2g BID today and recheck in AM. Ionized Ca still pending from this AM?  DLBCL: counts stable.  - Oncology consulted.   FTT: - PT eval, anticipate DC back to SNF   Severe protein calorie malnutrition:  - Nutrition consulted  DVT prophylaxis: Heparin gtt Code Status: Partial Family Communication: None at bedside Disposition Plan: Anticipate DC to SNF once improved.   Consultants:   IR  Oncology  Pharmacy  Procedures:   Nephrostomy exchange 11/1  Antimicrobials:  Vanc/cefepime 10/31 >>    Subjective: Breathing remarkably well today, in high spirits, still hasn't gotten OOB as of this AM. No chest pain. No fever currently.   Objective: Vitals:   10/04/17 2048 10/05/17 0439 10/05/17 1311 10/05/17 1631  BP: (!) 110/53 (!) 107/56 97/63 108/62  Pulse: 85 78 93 97  Resp: 18 20 18 18   Temp: 98 F (36.7 C) 98.3 F (36.8 C) 98.7 F (37.1 C) 98.6 F (37 C)  TempSrc: Oral Oral Oral Oral  SpO2: 100% 100% 96% 96%  Weight:      Height:        Intake/Output Summary (Last 24 hours) at 10/05/17 1855 Last data filed at 10/05/17 1339  Gross per 24 hour  Intake              560 ml  Output             1020 ml  Net             -  460 ml   Filed Weights   10/03/17 0900 10/03/17 0902 10/04/17 0551  Weight: 63.5 kg (140 lb) 63.5 kg (140 lb) 66 kg (145 lb 8.1 oz)    Gen: 56 y.o. male in no distress, pleasant. Pulm: Non-labored breathing. Clear to auscultation bilaterally, but decreased at left base.  CV: Regular rate and rhythm. No murmur, rub, or gallop. No JVD, no pedal edema. GI: Abdomen soft, non-tender, non-distended, with normoactive bowel sounds. No organomegaly or masses felt. Ext: Warm, no deformities Skin: No rashes, lesions  no ulcers. No bruising/bleeding. PCN tube site c/d/i. Neuro: Alert and oriented. No focal neurological deficits. Psych: Judgement and insight appear normal. Mood euthymic & broad affect   Data Reviewed: I have personally reviewed following labs and imaging studies  CBC:  Recent Labs Lab 10/01/17 1407 10/03/17 0930 10/04/17 0450 10/05/17 0434  WBC 1.3* 3.4* 2.4* 2.9*  NEUTROABS 0.7* 2.0  --   --   HGB 9.9* 9.2* 7.7* 7.8*  HCT 30.7* 29.9* 24.9* 24.6*  MCV 77.5* 80.8 81.9 80.7  PLT 366 415* 345 644   Basic Metabolic Panel:  Recent Labs Lab 10/01/17 1407 10/01/17 1407 10/03/17 0930 10/04/17 0450 10/05/17 0434  NA 138  --  139 140 140  K 3.8  --  3.0* 3.5 3.0*  CL  --   --  103 107 108  CO2 21*  --  20* 23 24  GLUCOSE 104  --  97 112* 92  BUN 10.9  --  10 9 9   CREATININE 0.5*  --  0.42* 0.34* 0.36*  CALCIUM 7.7*  --  6.9* 6.0* 6.4*  MG 2.0  --  2.0  --  1.7  PHOS  --  2.3* 1.6*  --  1.8*   GFR: Estimated Creatinine Clearance: 94 mL/min (A) (by C-G formula based on SCr of 0.36 mg/dL (L)). Liver Function Tests:  Recent Labs Lab 10/01/17 1407 10/03/17 0930 10/04/17 0450  AST 29 19 14*  ALT 48 31 23  ALKPHOS 186* 158* 126  BILITOT 1.19 1.6* 1.1  PROT 6.2* 5.9* 5.1*  ALBUMIN 2.6* 2.8* 2.2*   No results for input(s): LIPASE, AMYLASE in the last 168 hours. No results for input(s): AMMONIA in the last 168 hours. Coagulation Profile:  Recent Labs Lab 10/03/17 1914  INR 1.25   Cardiac Enzymes: No results for input(s): CKTOTAL, CKMB, CKMBINDEX, TROPONINI in the last 168 hours. BNP (last 3 results) No results for input(s): PROBNP in the last 8760 hours. HbA1C: No results for input(s): HGBA1C in the last 72 hours. CBG: No results for input(s): GLUCAP in the last 168 hours. Lipid Profile: No results for input(s): CHOL, HDL, LDLCALC, TRIG, CHOLHDL, LDLDIRECT in the last 72 hours. Thyroid Function Tests: No results for input(s): TSH, T4TOTAL, FREET4, T3FREE,  THYROIDAB in the last 72 hours. Anemia Panel: No results for input(s): VITAMINB12, FOLATE, FERRITIN, TIBC, IRON, RETICCTPCT in the last 72 hours. Urine analysis:    Component Value Date/Time   COLORURINE AMBER (A) 10/03/2017 1228   APPEARANCEUR CLOUDY (A) 10/03/2017 1228   LABSPEC 1.022 10/03/2017 1228   PHURINE 5.0 10/03/2017 1228   GLUCOSEU NEGATIVE 10/03/2017 1228   HGBUR LARGE (A) 10/03/2017 1228   BILIRUBINUR NEGATIVE 10/03/2017 1228   KETONESUR 80 (A) 10/03/2017 1228   PROTEINUR >=300 (A) 10/03/2017 1228   UROBILINOGEN 1.0 07/06/2017 1029   NITRITE NEGATIVE 10/03/2017 1228   LEUKOCYTESUR MODERATE (A) 10/03/2017 1228   Recent Results (from the past 240 hour(s))  Urine C&S  Status: Abnormal   Collection Time: 10/03/17 12:28 PM  Result Value Ref Range Status   Specimen Description URINE, CATHETERIZED  Final   Special Requests Immunocompromised  Final   Culture 50,000 COLONIES/mL ESCHERICHIA COLI (A)  Final   Report Status 10/05/2017 FINAL  Final   Organism ID, Bacteria ESCHERICHIA COLI (A)  Final      Susceptibility   Escherichia coli - MIC*    AMPICILLIN 4 SENSITIVE Sensitive     CEFAZOLIN <=4 SENSITIVE Sensitive     CEFTRIAXONE <=1 SENSITIVE Sensitive     CIPROFLOXACIN <=0.25 SENSITIVE Sensitive     GENTAMICIN <=1 SENSITIVE Sensitive     IMIPENEM <=0.25 SENSITIVE Sensitive     NITROFURANTOIN <=16 SENSITIVE Sensitive     TRIMETH/SULFA <=20 SENSITIVE Sensitive     AMPICILLIN/SULBACTAM <=2 SENSITIVE Sensitive     PIP/TAZO <=4 SENSITIVE Sensitive     Extended ESBL NEGATIVE Sensitive     * 50,000 COLONIES/mL ESCHERICHIA COLI  Culture, blood (routine x 2)     Status: None (Preliminary result)   Collection Time: 10/03/17  4:50 PM  Result Value Ref Range Status   Specimen Description BLOOD LEFT ANTECUBITAL  Final   Special Requests   Final    BOTTLES DRAWN AEROBIC ONLY Blood Culture adequate volume   Culture   Final    NO GROWTH 2 DAYS Performed at [facility], [address], [place], [place] [address]    Report Status PENDING  Incomplete  Culture, blood (routine x 2)     Status: None (Preliminary result)   Collection Time: 10/03/17  4:50 PM  Result Value Ref Range Status   Specimen Description BLOOD RIGHT ANTECUBITAL  Final   Special Requests   Final    BOTTLES DRAWN AEROBIC ONLY Blood Culture adequate volume   Culture   Final    NO GROWTH 2 DAYS Performed at [facility], [place] [address], [place],  [address]    Report Status PENDING  Incomplete  MRSA PCR Screening     Status: Abnormal   Collection Time: 10/03/17  5:03 PM  Result Value Ref Range Status   MRSA by PCR POSITIVE (A) NEGATIVE Final    Comment:        The GeneXpert MRSA Assay (FDA approved for NASAL specimens only), is one component of a comprehensive MRSA colonization surveillance program. It is not intended to diagnose MRSA infection nor to guide or monitor treatment for MRSA infections. RESULT CALLED TO, READ BACK BY AND VERIFIED WITH: Carlis Stable [id number] @ 2109 BY J SCOTTON       Radiology Studies: Ir Nephrostomy Exchange Right  Result Date: 10/04/2017 INDICATION: 56 year old with a right ureter stone and right percutaneous nephrostomy. Concern for infection. Request for a drain exchange. EXAM: RIGHT NEPHROSTOMY TUBE EXCHANGE WITH FLUOROSCOPY Physician: Stephan Minister. Anselm Pancoast, MD COMPARISON:  None. MEDICATIONS: None ANESTHESIA/SEDATION: None CONTRAST:  54mL ISOVUE-300 IOPAMIDOL (ISOVUE-300) INJECTION 61% - administered into the collecting system(s) FLUOROSCOPY TIME:  Fluoroscopy Time: 30 seconds, 6 mGy COMPLICATIONS: None immediate. PROCEDURE: The procedure was explained to the patient. The risks and benefits of the procedure were discussed and the patient's questions were addressed. Informed consent was obtained from the patient. Right flank was prepped and draped in sterile fashion. Maximal barrier sterile technique was utilized including caps, mask,  sterile gowns, sterile gloves, sterile drape, hand hygiene and skin antiseptic. Contrast was injected through the nephrostomy tube. Catheter was cut and removed over a Bentson wire. New  10.2 [place] multipurpose drain was reconstituted in the renal pelvis. Contrast was injected to confirm placement. Skin was anesthetized with 1% lidocaine. Catheter was sutured to skin with Prolene suture. Fluoroscopic images were taken and saved for this procedure. FINDINGS: Nephrostomy tube is well positioned within the right renal pelvis. Again noted is a stone in the proximal/mid right ureter. Delayed emptying of contrast distal to this stone. IMPRESSION: Successful exchange of the right percutaneous nephrostomy tube with fluoroscopy. Electronically Signed   By: Markus Daft M.D.   On: 10/04/2017 09:20    Scheduled Meds: . apixaban  10 mg Oral BID   Followed by  . [START ON 10/12/2017] apixaban  5 mg Oral BID  . chlorhexidine  15 mL Mouth Rinse BID  . Chlorhexidine Gluconate Cloth  6 each Topical Q0600  . dronabinol  5 mg Oral QAC lunch  . feeding supplement (ENSURE ENLIVE)  237 mL Oral BID BM  . hydrocortisone sod succinate (SOLU-CORTEF) inj  50 mg Intravenous Q12H  . magnesium oxide  400 mg Oral BID  . mupirocin ointment  1 application Nasal BID  . OLANZapine zydis  2.5 mg Oral QHS  . oxyCODONE  10 mg Oral Q12H  . pantoprazole  40 mg Oral Q lunch  . polyethylene glycol  17 g Oral Daily  . potassium & sodium phosphates  1 packet Oral TID AC & HS  . potassium chloride  40 mEq Oral BID   Continuous Infusions: . ceFEPime (MAXIPIME) IV Stopped (10/05/17 1409)  . vancomycin Stopped (10/05/17 1311)     LOS: 2 days   Time spent: 25 minutes.  Vance Gather, MD [facility] Pager [phone]  If 7PM-7AM, please contact night-coverage [email or web address] Password [facility] 10/05/2017, 6:55 PM

## 2017-10-05 NOTE — Progress Notes (Signed)
Initial Nutrition Assessment  DOCUMENTATION CODES:   Severe malnutrition in context of acute illness/injury, Non-severe (moderate) malnutrition in context of chronic illness  INTERVENTION:  - Will order Ensure Enlive po BID, each supplement provides 350 kcal and 20 grams of protein - Continue to encourage PO intakes of meals and supplements.  - RD will continue to monitor for additional nutrition-related needs.   NUTRITION DIAGNOSIS:   Severe Malnutrition related to acute illness, chronic illness, catabolic illness, cancer and cancer related treatments as evidenced by moderate fat depletion, moderate muscle depletion, percent weight loss.  GOAL:   Patient will meet greater than or equal to 90% of their needs  MONITOR:   PO intake, Supplement acceptance, Weight trends, Labs, Skin, I & O's  REASON FOR ASSESSMENT:   Consult Assessment of nutrition requirement/status  ASSESSMENT:   56 y.o. male with a history of DLBCL undergoing chemotherapy and right ureterolithiasis with nephrostomy tube who presented 10/31 from SNF due to concern for UTI with fever though he was found to be hypoxic in the ED and CTA demonstrated a small PE with infiltrate. WBC was 3.4, hgb 9.2 near baseline. UA with nitrite positive pyuria. Vancomycin and cefepime were started. He had been failing to thrive while undergoing chemotherapy causing him to require SNF. Oncology has been giving reduced doses of chemotherapy.   BMI indicates normal weight/borderline underweight. No intakes documented since admission. Pt's breakfast tray was at bedside; oatmeal untouched, 75% of blueberry muffin, 50-75% orange juice, a few bites of fresh fruit. Pt was dx with lymphoma in July 2018 and has had multiple admissions since that time; pt was last seen by an RD on 10/22. Pt was ordered Ensure during that time and RD also discussed the importance of protein. Pt recalls these items and he is open to receiving chocolate Ensure. He  thinks he will only be able to drink 1 bottle/day but will order it BID in case he is able to consume more. Pt states that his appetite has been fairly poorly but that he "forces" himself to eat. He is very knowledgeable about the role nutrition plays in his health and overall strength and states that he has always been interested in nutrition and making healthy choices (complex carbs, low Na). Pt states taste alterations with all food that is described as limited taste, decreased flavor perception.   Physical assessment outlined below. Pt goes to the [facility] for IV hydration. Noted that he has lost 23 lbs (13.7% body weight) in the past 1.5 months. This is significant for time frame, but suspect at least some of this weight change is d/t fluid changes/dehydration. Dr. Clydene Laming note from this AM states: Patient is prone to electrolyte abnormalities, recommend aggressive replacement.  Medications reviewed; 5 mg Marinol/day, 50 mg Solu-cortef BID, 400 mg Mag-ox BID, 40 mg oral Protonix/day, 1 packet Miralax/day, 1 packet PhosNak TID, 40 mEq oral KCl x3 doses. Labs reviewed; K: 3 mmol/L, creatinine: 0.36 mg/dL, Ca: 6.4 mg/dL, Phos: 1.8 mg/dL.    NUTRITION - FOCUSED PHYSICAL EXAM:    Most Recent Value  Orbital Region  Mild depletion  Upper Arm Region  Moderate depletion  Thoracic and Lumbar Region  Moderate depletion  Buccal Region  Mild depletion  Temple Region  Mild depletion  Clavicle Bone Region  Moderate depletion  Clavicle and Acromion Bone Region  Moderate depletion  Scapular Bone Region  Unable to assess  Dorsal Hand  Mild depletion  Patellar Region  Unable to assess  Anterior Thigh Region  Unable to assess  Posterior Calf Region  Unable to assess  Edema (RD Assessment)  Unable to assess  Hair  Reviewed  Eyes  Reviewed  Mouth  Reviewed  Skin  Reviewed  Nails  Reviewed       Diet Order:  Diet regular Room service appropriate? Yes; Fluid consistency: Thin  EDUCATION NEEDS:    Education needs have been addressed  Skin:  Skin Assessment: Skin Integrity Issues: Skin Integrity Issues:: Stage II Stage II: R IT   Last BM:  10/31  Height:   Ht Readings from Last 1 Encounters:  10/04/17 6\' 2"  (1.88 m)    Weight:   Wt Readings from Last 1 Encounters:  10/04/17 145 lb 8.1 oz (66 kg)    Ideal Body Weight:  86.36 kg  BMI:  Body mass index is 18.68 kg/m.  Estimated Nutritional Needs:   Kcal:  2180-2375 (33-36 kcal/kg)  Protein:  112-132 (1.7-2 grams/kg)  Fluid:  >/= 2.2 L/day      Jarome Matin, MS, RD, LDN, [facility] Inpatient Clinical Dietitian Pager # [phone] After hours/weekend pager # [phone]

## 2017-10-06 LAB — BASIC METABOLIC PANEL
ANION GAP: 9 (ref 5–15)
Anion gap: 9 (ref 5–15)
BUN: <5 mg/dL — ABNORMAL LOW (ref 6–20)
BUN: <5 mg/dL — ABNORMAL LOW (ref 6–20)
CALCIUM: 6.1 mg/dL — AB (ref 8.9–10.3)
CO2: 27 mmol/L (ref 22–32)
CO2: 29 mmol/L (ref 22–32)
Calcium: 6.1 mg/dL — CL (ref 8.9–10.3)
Chloride: 104 mmol/L (ref 101–111)
Chloride: 98 mmol/L — ABNORMAL LOW (ref 101–111)
Creatinine, Ser: <0.3 mg/dL — ABNORMAL LOW (ref 0.61–1.24)
GLUCOSE: 91 mg/dL (ref 65–99)
GLUCOSE: 105 mg/dL — AB (ref 65–99)
POTASSIUM: 3.1 mmol/L — AB (ref 3.5–5.1)
Potassium: 2.5 mmol/L — CL (ref 3.5–5.1)
Sodium: 140 mmol/L (ref 135–145)
Sodium: 136 mmol/L (ref 135–145)

## 2017-10-06 LAB — CBC
HCT: 25.3 % — ABNORMAL LOW (ref 39.0–52.0)
Hemoglobin: 8.1 g/dL — ABNORMAL LOW (ref 13.0–17.0)
MCH: 25.7 pg — ABNORMAL LOW (ref 26.0–34.0)
MCHC: 32 g/dL (ref 30.0–36.0)
MCV: 80.3 fL (ref 78.0–100.0)
Platelets: 331 10*3/uL (ref 150–400)
RBC: 3.15 MIL/uL — AB (ref 4.22–5.81)
RDW: 20.7 % — AB (ref 11.5–15.5)
WBC: 3 10*3/uL — AB (ref 4.0–10.5)

## 2017-10-06 LAB — MAGNESIUM: Magnesium: 1.5 mg/dL — ABNORMAL LOW (ref 1.7–2.4)

## 2017-10-06 LAB — PHOSPHORUS: Phosphorus: 2.2 mg/dL — ABNORMAL LOW (ref 2.5–4.6)

## 2017-10-06 LAB — CALCIUM, IONIZED: CALCIUM, IONIZED, SERUM: 3.5 mg/dL — AB (ref 4.5–5.6)

## 2017-10-06 MED ORDER — MAGNESIUM SULFATE 2 GM/50ML IV SOLN
2.0000 g | Freq: Once | INTRAVENOUS | Status: AC
  Administered 2017-10-06: 2 g via INTRAVENOUS
  Filled 2017-10-06: qty 50

## 2017-10-06 MED ORDER — POTASSIUM CHLORIDE 10 MEQ/100ML IV SOLN
10.0000 meq | INTRAVENOUS | Status: AC
  Administered 2017-10-06 (×6): 10 meq via INTRAVENOUS
  Filled 2017-10-06 (×6): qty 100

## 2017-10-06 MED ORDER — POTASSIUM CHLORIDE 10 MEQ/100ML IV SOLN
10.0000 meq | INTRAVENOUS | Status: AC
  Administered 2017-10-06 (×5): 10 meq via INTRAVENOUS
  Filled 2017-10-06 (×6): qty 100

## 2017-10-06 MED ORDER — CEFAZOLIN SODIUM-DEXTROSE 1-4 GM/50ML-% IV SOLN
1.0000 g | Freq: Three times a day (TID) | INTRAVENOUS | Status: DC
  Administered 2017-10-06 – 2017-10-08 (×5): 1 g via INTRAVENOUS
  Filled 2017-10-06 (×6): qty 50

## 2017-10-06 MED ORDER — SODIUM CHLORIDE 0.9 % IV SOLN
2.0000 g | Freq: Three times a day (TID) | INTRAVENOUS | Status: AC
  Administered 2017-10-06 – 2017-10-07 (×2): 2 g via INTRAVENOUS
  Filled 2017-10-06 (×2): qty 20

## 2017-10-06 MED ORDER — SODIUM CHLORIDE 0.9 % IV SOLN
1.0000 g | Freq: Once | INTRAVENOUS | Status: AC
  Administered 2017-10-06: 1 g via INTRAVENOUS
  Filled 2017-10-06: qty 10

## 2017-10-06 NOTE — Progress Notes (Signed)
CRITICAL VALUE ALERT  Critical Value:  Calcium 6.1  Date & Time Notied:  10/06/17  1615  Provider Notified:Grunz  Orders Received/Actions taken: Continue to monitor

## 2017-10-06 NOTE — Progress Notes (Signed)
PROGRESS NOTE  Cory Ibarra  VZD:[id number] DOB: 1961-04-01 DOA: 10/03/2017 PCP: [name], [name]  Outpatient Specialists: Oncology, [name] Brief Narrative: Cory Ibarra is a 56 y.o. male with a history of DLBCL undergoing chemotherapy and right ureterolithiasis with nephrostomy tube who presented 10/31 from SNF due to concern for UTI with fever though he was found to be hypoxic in the ED and CTA demonstrated a small PE with infiltrate. WBC was 3.4, hgb 9.2 near baseline. UA with nitrite positive pyuria. Vancomycin and cefepime were started. He had been failing to thrive while undergoing chemotherapy causing him to require SNF. Oncology has been giving reduced doses of chemotherapy.   Assessment & Plan: Active Problems:   Pulmonary emboli (HCC)   B-cell lymphoma ([place])   Healthcare-associated pneumonia   Obstructive uropathy   Pressure injury of skin   Dehydration   Hypoxia  Acute hypoxic respiratory failure: Due to PE and pneumonia.  - Continue oxygen prn and treat as below  Acute IR:[id number] within the LLL and lingula with minimal clot burden no evidence of RV strain. - Transitioned heparin gtt > eliquis 11/2.    Sepsis due to LLL pneumonia and UTI: - Urine culture with 50k GNR's, plan to treat with complications of nephrostomy tube and immunocompromise.  - Oncology prefers at least 72 hours parenteral abx, tailor abx to ancef per sensitivities now back on culture. Blood cultures negative. Will plan to transition to keflex if remains stable 11/5.   Adrenal insufficiency: Empiric Dx due to chronic prednisone 10mg  daily.  - Continue stress-dose steroids per onc, will need prolonged taper.   Ureterolithiasis: Stone in proximal/mid right ureter with incomplete obstruction evidenced by delayed emptying of contrast distal to this stone seen during nephrostomy drain exchange 11/1.  - Continue nephrostomy tube, definitive management outside scope of current illness per urology.  -  Continuing foley catheter for now, pending urology recommendations.   Hypokalemia: Recurrent, replacing per orders. - Check BMP, Mg, Phos daily and replace aggressively as indicated  Hypocalcemia: Ca 6.1, still corrects to low with hypoalbuminemia, refractory to repletion.  - Ionized calcium levels are not available. Replaced w/1g this AM, recheck remains stable so will give 2g twice tonight and recheck in AM w/albumin. If remains refractory, may need infusion.  - Continue telemetry - Correcting magnesium as well by po and IV.   DLBCL: counts stable.  - Oncology consulted.   FTT: - PT eval, recommending DC back to SNF   Severe protein calorie malnutrition:  - Nutrition consulted  DVT prophylaxis: Eliquis.  Code Status: Partial Family Communication: None at bedside Disposition Plan: Anticipate DC to SNF 11/5.   Consultants:   IR  Oncology  Pharmacy  Procedures:   Nephrostomy exchange 11/1  Antimicrobials:  Vanc/cefepime 10/31- 11/3  Ancef 11/3 >>    Subjective: No complaints, visiting with longtime friends in the room. Denies weakness, lightheadedness/dizziness, numbness, tingling, spasm. No dyspnea, fever, cough, chest pain or palpitations. Still hasn't had BM since 10/31 but refusing laxatives as this is his baseline and he has no complaints.   Objective: Vitals:   10/05/17 1631 10/05/17 2036 10/06/17 0546 10/06/17 1350  BP: 108/62 (!) 103/49 113/64 (!) 101/57  Pulse: 97 81 91 93  Resp: 18 18 16 16   Temp: 98.6 F (37 C) 98.3 F (36.8 C) 98.1 F (36.7 C) 98.2 F (36.8 C)  TempSrc: Oral Oral Oral Oral  SpO2: 96% 97% 100% 100%  Weight:      Height:  Intake/Output Summary (Last 24 hours) at 10/06/17 1627 Last data filed at 10/06/17 1525  Gross per 24 hour  Intake             1580 ml  Output             2175 ml  Net             -595 ml   Filed Weights   10/03/17 0900 10/03/17 0902 10/04/17 0551  Weight: 63.5 kg (140 lb) 63.5 kg (140 lb) 66  kg (145 lb 8.1 oz)    Gen: 56 y.o. male in no distress, pleasant. Pulm: Non-labored breathing. Clear to auscultation bilaterally.  CV: Regular rate and rhythm. No murmur, rub, or gallop. No JVD, no pedal edema. GI: Abdomen soft, non-tender, non-distended, with normoactive bowel sounds. No organomegaly or masses felt. Ext: Warm, no deformities Skin: No rashes, lesions no ulcers. No bruising/bleeding. PCN tube site c/d/i. Neuro: Alert and oriented. No focal neurological deficits. Psych: Judgement and insight appear normal. Mood euthymic & broad affect   Data Reviewed: I have personally reviewed following labs and imaging studies  CBC:  Recent Labs Lab 10/01/17 1407 10/03/17 0930 10/04/17 0450 10/05/17 0434 10/06/17 0343  WBC 1.3* 3.4* 2.4* 2.9* 3.0*  NEUTROABS 0.7* 2.0  --   --   --   HGB 9.9* 9.2* 7.7* 7.8* 8.1*  HCT 30.7* 29.9* 24.9* 24.6* 25.3*  MCV 77.5* 80.8 81.9 80.7 80.3  PLT 366 415* 345 329 619   Basic Metabolic Panel:  Recent Labs Lab 10/01/17 1407 10/01/17 1407 10/03/17 0930 10/04/17 0450 10/05/17 0434 10/06/17 0343 10/06/17 1510  NA 138  --  139 140 140 140 136  K 3.8  --  3.0* 3.5 3.0* 2.5* 3.1*  CL  --   --  103 107 108 104 98*  CO2 21*  --  20* 23 24 27 29   GLUCOSE 104  --  97 112* 92 91 105*  BUN 10.9  --  10 9 9  <5* <5*  CREATININE 0.5*  --  0.42* 0.34* 0.36* <0.30* <0.30*  CALCIUM 7.7*  --  6.9* 6.0* 6.4* 6.1* 6.1*  MG 2.0  --  2.0  --  1.7 1.5*  --   PHOS  --  2.3* 1.6*  --  1.8* 2.2*  --    GFR: CrCl cannot be calculated (This lab value cannot be used to calculate CrCl because it is not a number: <0.30). Liver Function Tests:  Recent Labs Lab 10/01/17 1407 10/03/17 0930 10/04/17 0450  AST 29 19 14*  ALT 48 31 23  ALKPHOS 186* 158* 126  BILITOT 1.19 1.6* 1.1  PROT 6.2* 5.9* 5.1*  ALBUMIN 2.6* 2.8* 2.2*   Urine analysis:    Component Value Date/Time   COLORURINE AMBER (A) 10/03/2017 1228   APPEARANCEUR CLOUDY (A) 10/03/2017 1228     LABSPEC 1.022 10/03/2017 1228   PHURINE 5.0 10/03/2017 1228   GLUCOSEU NEGATIVE 10/03/2017 1228   HGBUR LARGE (A) 10/03/2017 1228   BILIRUBINUR NEGATIVE 10/03/2017 1228   KETONESUR 80 (A) 10/03/2017 1228   PROTEINUR >=300 (A) 10/03/2017 1228   UROBILINOGEN 1.0 07/06/2017 1029   NITRITE NEGATIVE 10/03/2017 1228   LEUKOCYTESUR MODERATE (A) 10/03/2017 1228   Recent Results (from the past 240 hour(s))  Urine C&S     Status: Abnormal   Collection Time: 10/03/17 12:28 PM  Result Value Ref Range Status   Specimen Description URINE, CATHETERIZED  Final   Special Requests Immunocompromised  Final   Culture 50,000 COLONIES/mL ESCHERICHIA COLI (A)  Final   Report Status 10/05/2017 FINAL  Final   Organism ID, Bacteria ESCHERICHIA COLI (A)  Final      Susceptibility   Escherichia coli - MIC*    AMPICILLIN 4 SENSITIVE Sensitive     CEFAZOLIN <=4 SENSITIVE Sensitive     CEFTRIAXONE <=1 SENSITIVE Sensitive     CIPROFLOXACIN <=0.25 SENSITIVE Sensitive     GENTAMICIN <=1 SENSITIVE Sensitive     IMIPENEM <=0.25 SENSITIVE Sensitive     NITROFURANTOIN <=16 SENSITIVE Sensitive     TRIMETH/SULFA <=20 SENSITIVE Sensitive     AMPICILLIN/SULBACTAM <=2 SENSITIVE Sensitive     PIP/TAZO <=4 SENSITIVE Sensitive     Extended ESBL NEGATIVE Sensitive     * 50,000 COLONIES/mL ESCHERICHIA COLI  Culture, blood (routine x 2)     Status: None (Preliminary result)   Collection Time: 10/03/17  4:50 PM  Result Value Ref Range Status   Specimen Description BLOOD LEFT ANTECUBITAL  Final   Special Requests   Final    BOTTLES DRAWN AEROBIC ONLY Blood Culture adequate volume   Culture   Final    NO GROWTH 3 DAYS Performed at [facility], [address], [place], [place] [address]    Report Status PENDING  Incomplete  Culture, blood (routine x 2)     Status: None (Preliminary result)   Collection Time: 10/03/17  4:50 PM  Result Value Ref Range Status   Specimen Description BLOOD RIGHT ANTECUBITAL  Final    Special Requests   Final    BOTTLES DRAWN AEROBIC ONLY Blood Culture adequate volume   Culture   Final    NO GROWTH 3 DAYS Performed at [facility], [address], [place], [place] [address]    Report Status PENDING  Incomplete  MRSA PCR Screening     Status: Abnormal   Collection Time: 10/03/17  5:03 PM  Result Value Ref Range Status   MRSA by PCR POSITIVE (A) NEGATIVE Final    Comment:        The GeneXpert MRSA Assay (FDA approved for NASAL specimens only), is one component of a comprehensive MRSA colonization surveillance program. It is not intended to diagnose MRSA infection nor to guide or monitor treatment for MRSA infections. RESULT CALLED TO, READ BACK BY AND VERIFIED WITH: Carlis Stable [id number] @ 2109 BY J SCOTTON       Radiology Studies: No results found.  Scheduled Meds: . apixaban  10 mg Oral BID   Followed by  . [START ON 10/12/2017] apixaban  5 mg Oral BID  . chlorhexidine  15 mL Mouth Rinse BID  . Chlorhexidine Gluconate Cloth  6 each Topical Q0600  . dronabinol  5 mg Oral QAC lunch  . feeding supplement (ENSURE ENLIVE)  237 mL Oral BID BM  . hydrocortisone sod succinate (SOLU-CORTEF) inj  50 mg Intravenous Q12H  . magnesium oxide  400 mg Oral BID  . mupirocin ointment  1 application Nasal BID  . OLANZapine zydis  2.5 mg Oral QHS  . oxyCODONE  10 mg Oral Q12H  . pantoprazole  40 mg Oral Q lunch  . polyethylene glycol  17 g Oral Daily  . potassium & sodium phosphates  1 packet Oral TID AC & HS   Continuous Infusions: . calcium gluconate    .  ceFAZolin (ANCEF) IV    . potassium chloride       LOS: 3 days  Time spent: 25 minutes.  Vance Gather, MD [facility] Pager [phone]  If 7PM-7AM, please contact night-coverage [email or web address] Password [facility] 10/06/2017, 4:27 PM

## 2017-10-06 NOTE — Progress Notes (Signed)
Pt refuses miralax and ensure for 2 days. Educated on importance of both. Pt without BM since 10/31 but states that is baseline for him. Continued to encourage. Pt states he may take later.

## 2017-10-06 NOTE — Progress Notes (Signed)
CRITICAL VALUE ALERT  Critical Value:  K 2.5; Ca 6.1  Date & Time Notied: 10/06/17 0444  Provider Notified: Donnal Debar, NP  Orders Received/Actions taken: IV calcium gluconate ordered and administered

## 2017-10-06 NOTE — Progress Notes (Signed)
Pharmacy Antibiotic Note  Cory Ibarra is a 56 y.o. male admitted on 10/03/2017 with sepsis, found to have urine culture with 50K E.coli - culture pan-sensitive.  Plan to treat with complications of nephrostomy tube and immunocompromised state.  Pharmacy has been consulted for cefazolin dosing.  Day #4 total antibiotics. Cefepime/Vanc to Cefazolin. SCr low.  Plan: Cefazolin 1g IV q8h. F/u transition to PO abx - could consider transition to Keflex 500 mg BID to complete course for UTI treatment.  Height: 6\' 2"  (188 cm) Weight: 145 lb 8.1 oz (66 kg) IBW/kg (Calculated) : 82.2  Temp (24hrs), Avg:98.3 F (36.8 C), Min:98.1 F (36.7 C), Max:98.6 F (37 C)   Recent Labs Lab 10/01/17 1407 10/01/17 1407 10/03/17 0930 10/03/17 1914 10/03/17 2240 10/04/17 0450 10/05/17 0434 10/06/17 0343  WBC 1.3*  --  3.4*  --   --  2.4* 2.9* 3.0*  CREATININE  --  0.5* 0.42*  --   --  0.34* 0.36* <0.30*  LATICACIDVEN  --   --   --  0.9 0.9  --   --   --     CrCl cannot be calculated (This lab value cannot be used to calculate CrCl because it is not a number: <0.30).    Allergies  Allergen Reactions  . Bee Venom Swelling   Antimicrobials this admission:  10/31 vanc>> 11/3 10/31 cefepime>> 11/3 11/3 cefazolin >>   Microbiology results:  10/31 BCx x2: ngtd 10/31 UCx: 50k E.coli pansens 10/31 MRSA PCR: positive  Thank you for allowing pharmacy to be a part of this patient's care.  [name] 10/06/2017 3:14 PM

## 2017-10-07 ENCOUNTER — Other Ambulatory Visit: Payer: Self-pay

## 2017-10-07 LAB — COMPREHENSIVE METABOLIC PANEL
ALK PHOS: 115 U/L (ref 38–126)
ALT: 21 U/L (ref 17–63)
AST: 22 U/L (ref 15–41)
Albumin: 2.3 g/dL — ABNORMAL LOW (ref 3.5–5.0)
Anion gap: 8 (ref 5–15)
CHLORIDE: 101 mmol/L (ref 101–111)
CO2: 29 mmol/L (ref 22–32)
Calcium: 7 mg/dL — ABNORMAL LOW (ref 8.9–10.3)
GLUCOSE: 90 mg/dL (ref 65–99)
Potassium: 3.3 mmol/L — ABNORMAL LOW (ref 3.5–5.1)
SODIUM: 138 mmol/L (ref 135–145)
Total Bilirubin: 0.8 mg/dL (ref 0.3–1.2)
Total Protein: 4.8 g/dL — ABNORMAL LOW (ref 6.5–8.1)

## 2017-10-07 LAB — CBC
HCT: 27.2 % — ABNORMAL LOW (ref 39.0–52.0)
HEMOGLOBIN: 8.6 g/dL — AB (ref 13.0–17.0)
MCH: 25.7 pg — AB (ref 26.0–34.0)
MCHC: 31.6 g/dL (ref 30.0–36.0)
MCV: 81.2 fL (ref 78.0–100.0)
Platelets: 336 10*3/uL (ref 150–400)
RBC: 3.35 MIL/uL — AB (ref 4.22–5.81)
RDW: 20.5 % — ABNORMAL HIGH (ref 11.5–15.5)
WBC: 2.7 10*3/uL — ABNORMAL LOW (ref 4.0–10.5)

## 2017-10-07 LAB — PHOSPHORUS: PHOSPHORUS: 2.2 mg/dL — AB (ref 2.5–4.6)

## 2017-10-07 LAB — CALCIUM, IONIZED: CALCIUM, IONIZED, SERUM: 3.6 mg/dL — AB (ref 4.5–5.6)

## 2017-10-07 LAB — MAGNESIUM: Magnesium: 1.9 mg/dL (ref 1.7–2.4)

## 2017-10-07 MED ORDER — POTASSIUM CHLORIDE 10 MEQ/100ML IV SOLN
10.0000 meq | INTRAVENOUS | Status: AC
  Administered 2017-10-07 (×6): 10 meq via INTRAVENOUS
  Filled 2017-10-07 (×6): qty 100

## 2017-10-07 MED ORDER — SODIUM CHLORIDE 0.9 % IV SOLN
2.0000 g | Freq: Three times a day (TID) | INTRAVENOUS | Status: AC
  Administered 2017-10-07 (×2): 2 g via INTRAVENOUS
  Filled 2017-10-07 (×2): qty 20

## 2017-10-07 NOTE — Progress Notes (Signed)
CSW following to facilitate discharge planning. Patient is a recent readmit, with complete CSW assessment conducted on 09/18/17. Patient is from [place] receiving rehabilitation services. CSW will follow to facilitate return when medically stable.  Laveda Abbe, LCSW Clinical Social Worker [phone] (Weekend Coverage)

## 2017-10-07 NOTE — Progress Notes (Signed)
PROGRESS NOTE  Cory Ibarra  QMG:[id number] DOB: 08-19-1961 DOA: 10/03/2017 PCP: [name], [name]  Outpatient Specialists: Oncology, [name] Brief Narrative: Cory Ibarra is a 56 y.o. male with a history of DLBCL undergoing chemotherapy and right ureterolithiasis with nephrostomy tube who presented 10/31 from SNF due to concern for UTI with fever though he was found to be hypoxic in the ED and CTA demonstrated a small PE with infiltrate. WBC was 3.4, hgb 9.2 near baseline. UA with nitrite positive pyuria. Vancomycin and cefepime were started. He had been failing to thrive while undergoing chemotherapy causing him to require SNF. Oncology has been giving reduced doses of chemotherapy.   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 Resolved.  Acute TO:[id number] within the LLL and lingula with minimal clot burden no evidence of RV strain. - Transitioned heparin gtt > eliquis 11/2 and is stable.    Sepsis due to LLL pneumonia and possible UTI: - Oncology prefers at least 72 hours parenteral abx > ancef, pt has remained afebrile. Blood cultures negative. Will plan to transition to keflex if remains stable 11/5.  - Urine culture with 50k GNR's, plan to treat with complications of nephrostomy tube and immunocompromise.   Adrenal insufficiency: Empiric Dx due to chronic prednisone 10mg  daily.  - Continue stress-dose steroids per onc, will need prolonged taper.   Ureterolithiasis: Stone in proximal/mid right ureter with incomplete obstruction evidenced by delayed emptying of contrast distal to this stone seen during nephrostomy drain exchange 11/1.  - Continue nephrostomy tube, definitive management outside scope of current illness per urology.  - Continuing foley catheter for now,  pending urology recommendations.   Hypokalemia: Recurrent, replacing per orders. - Check BMP, Mg, Phos daily and replace aggressively as indicated. Giving additional IV potassium for K of 3.3. Continue po supplementation of magnesium and phosphorus.  Hypocalcemia: Ca 7, improved, still low (8.4 with correction). but usually wouldn't require repletion. With his refractory hypocalcemia in the past, I will continue repletion now and recheck in AM.    DLBCL: counts stable.  - Oncology consulted.  - Pain controlled with oxycodone.  FTT: - PT eval, recommending DC back to SNF   Severe protein calorie malnutrition:  - Nutrition consulted  DVT prophylaxis: Eliquis.  Code Status: Partial Family Communication: None at bedside Disposition Plan: Anticipate DC to SNF 11/5.   Consultants:   IR  Oncology  Pharmacy  Procedures:   Nephrostomy exchange 11/1  Antimicrobials:  Vanc/cefepime 10/31- 11/3  Ancef 11/3 >>    Subjective: No complaints again this morning other than pain controlled by oxycodone. No appetite still, but knows to try to eat what he can. No dyspnea. No fevers or bleeding.   Objective: Vitals:   10/06/17 0546 10/06/17 1350 10/06/17 2134 10/07/17 0409  BP: 113/64 (!) 101/57 (!) 106/59 114/61  Pulse: 91 93 78 77  Resp: 16 16 18 16   Temp: 98.1 F (36.7 C) 98.2 F (36.8 C) 98.5 F (36.9 C) 98.3 F (36.8 C)  TempSrc: Oral Oral Oral Oral  SpO2: 100% 100% 98% 100%  Weight:      Height:        Intake/Output Summary (Last 24 hours) at 10/07/2017 1404 Last data filed at 10/07/2017 1100 Gross per 24 hour  Intake 2865 ml  Output 2125 ml  Net 740 ml   Filed Weights   10/03/17 0900 10/03/17 0902 10/04/17 0551  Weight: 63.5 kg (140 lb) 63.5 kg (140 lb) 66 kg (145 lb 8.1 oz)    Gen: 56 y.o. male in no distress, pleasant, sitting in chair. Pulm: Non-labored breathing. Clear to auscultation bilaterally.  CV: Regular rate and rhythm. No murmur, rub, or  gallop. No JVD, no pedal edema. GI: Abdomen soft, non-tender, non-distended, with normoactive bowel sounds. No organomegaly or masses felt. MSK: Decreased muscle bulk diffusely. Tenderness to palpation of lower midline back, negative straight leg raise.  Skin: No rashes, lesions no ulcers. No bruising/bleeding. PCN tube site c/d/i nontender. Neuro: Alert and oriented. No focal neurological deficits. Psych: Judgement and insight appear normal. Mood euthymic & broad affect   Data Reviewed: I have personally reviewed following labs and imaging studies  CBC: Recent Labs  Lab 10/01/17 1407 10/03/17 0930 10/04/17 0450 10/05/17 0434 10/06/17 0343 10/07/17 0356  WBC 1.3* 3.4* 2.4* 2.9* 3.0* 2.7*  NEUTROABS 0.7* 2.0  --   --   --   --   HGB 9.9* 9.2* 7.7* 7.8* 8.1* 8.6*  HCT 30.7* 29.9* 24.9* 24.6* 25.3* 27.2*  MCV 77.5* 80.8 81.9 80.7 80.3 81.2  PLT 366 415* 345 329 331 741   Basic Metabolic Panel: Recent Labs  Lab 10/01/17 1407 10/01/17 1407  10/03/17 0930 10/04/17 0450 10/05/17 0434 10/06/17 0343 10/06/17 1510 10/07/17 0356  NA 138  --    < > 139 140 140 140 136 138  K 3.8  --    < > 3.0* 3.5 3.0* 2.5* 3.1* 3.3*  CL  --   --    < > 103 107 108 104 98* 101  CO2 21*  --    < > 20* 23 24 27 29 29   GLUCOSE 104  --    < > 97 112* 92 91 105* 90  BUN 10.9  --    < > 10 9 9  <5* <5* <5*  CREATININE 0.5*  --    < > 0.42* 0.34* 0.36* <0.30* <0.30* <0.30*  CALCIUM 7.7*  --    < > 6.9* 6.0* 6.4* 6.1* 6.1* 7.0*  MG 2.0  --   --  2.0  --  1.7 1.5*  --  1.9  PHOS  --  2.3*  --  1.6*  --  1.8* 2.2*  --  2.2*   < > = values in this interval not displayed.   GFR: CrCl cannot be calculated (This lab value cannot be used to calculate CrCl because it is not a number: <0.30). Liver Function Tests: Recent Labs  Lab 10/01/17 1407 10/03/17 0930 10/04/17 0450 10/07/17 0356  AST 29 19 14* 22  ALT 48 31 23 21   ALKPHOS 186* 158* 126 115  BILITOT 1.19 1.6* 1.1 0.8  PROT 6.2* 5.9* 5.1* 4.8*    ALBUMIN 2.6* 2.8* 2.2* 2.3*   Urine analysis:    Component Value Date/Time   COLORURINE AMBER (A) 10/03/2017 1228   APPEARANCEUR CLOUDY (A) 10/03/2017 1228   LABSPEC 1.022 10/03/2017 1228   PHURINE 5.0 10/03/2017 1228   GLUCOSEU NEGATIVE 10/03/2017 1228   HGBUR LARGE (A) 10/03/2017 1228   BILIRUBINUR NEGATIVE 10/03/2017 1228   KETONESUR 80 (A) 10/03/2017 1228   PROTEINUR >=300 (A) 10/03/2017 1228   UROBILINOGEN 1.0 07/06/2017 1029   NITRITE NEGATIVE 10/03/2017 1228   LEUKOCYTESUR MODERATE (A) 10/03/2017 1228   Recent Results (from the past 240  hour(s))  Urine C&S     Status: Abnormal   Collection Time: 10/03/17 12:28 PM  Result Value Ref Range Status   Specimen Description URINE, CATHETERIZED  Final   Special Requests Immunocompromised  Final   Culture 50,000 COLONIES/mL ESCHERICHIA COLI (A)  Final   Report Status 10/05/2017 FINAL  Final   Organism ID, Bacteria ESCHERICHIA COLI (A)  Final      Susceptibility   Escherichia coli - MIC*    AMPICILLIN 4 SENSITIVE Sensitive     CEFAZOLIN <=4 SENSITIVE Sensitive     CEFTRIAXONE <=1 SENSITIVE Sensitive     CIPROFLOXACIN <=0.25 SENSITIVE Sensitive     GENTAMICIN <=1 SENSITIVE Sensitive     IMIPENEM <=0.25 SENSITIVE Sensitive     NITROFURANTOIN <=16 SENSITIVE Sensitive     TRIMETH/SULFA <=20 SENSITIVE Sensitive     AMPICILLIN/SULBACTAM <=2 SENSITIVE Sensitive     PIP/TAZO <=4 SENSITIVE Sensitive     Extended ESBL NEGATIVE Sensitive     * 50,000 COLONIES/mL ESCHERICHIA COLI  Culture, blood (routine x 2)     Status: None (Preliminary result)   Collection Time: 10/03/17  4:50 PM  Result Value Ref Range Status   Specimen Description BLOOD LEFT ANTECUBITAL  Final   Special Requests   Final    BOTTLES DRAWN AEROBIC ONLY Blood Culture adequate volume   Culture   Final    NO GROWTH 3 DAYS Performed at [facility], [address] [place], [place] [address]    Report Status PENDING  Incomplete  Culture, blood (routine x 2)      Status: None (Preliminary result)   Collection Time: 10/03/17  4:50 PM  Result Value Ref Range Status   Specimen Description BLOOD RIGHT ANTECUBITAL  Final   Special Requests   Final    BOTTLES DRAWN AEROBIC ONLY Blood Culture adequate volume   Culture   Final    NO GROWTH 3 DAYS Performed at [facility], [address], [place], [place] [address]    Report Status PENDING  Incomplete  MRSA PCR Screening     Status: Abnormal   Collection Time: 10/03/17  5:03 PM  Result Value Ref Range Status   MRSA by PCR POSITIVE (A) NEGATIVE Final    Comment:        The GeneXpert MRSA Assay (FDA approved for NASAL specimens only), is one component of a comprehensive MRSA colonization surveillance program. It is not intended to diagnose MRSA infection nor to guide or monitor treatment for MRSA infections. RESULT CALLED TO, READ BACK BY AND VERIFIED WITH: Carlis Stable [id number] @ 2109 BY J SCOTTON       Radiology Studies: No results found.  Scheduled Meds: . apixaban  10 mg Oral BID   Followed by  . [START ON 10/12/2017] apixaban  5 mg Oral BID  . chlorhexidine  15 mL Mouth Rinse BID  . Chlorhexidine Gluconate Cloth  6 each Topical Q0600  . dronabinol  5 mg Oral QAC lunch  . feeding supplement (ENSURE ENLIVE)  237 mL Oral BID BM  . hydrocortisone sod succinate (SOLU-CORTEF) inj  50 mg Intravenous Q12H  . magnesium oxide  400 mg Oral BID  . mupirocin ointment  1 application Nasal BID  . OLANZapine zydis  2.5 mg Oral QHS  . oxyCODONE  10 mg Oral Q12H  . pantoprazole  40 mg Oral Q lunch  . polyethylene glycol  17 g Oral Daily  . potassium & sodium phosphates  1  packet Oral TID AC & HS   Continuous Infusions: . calcium gluconate Stopped (10/07/17 1014)  .  ceFAZolin (ANCEF) IV Stopped (10/07/17 9357)  . potassium chloride 10 mEq (10/07/17 1242)     LOS: 4 days   Time spent: 25 minutes.  Vance Gather, MD [facility] Pager [phone]  If 7PM-7AM, please  contact night-coverage [email or web address] Password [email or web address] 10/07/2017, 2:04 PM

## 2017-10-07 NOTE — Discharge Instructions (Signed)
Information on my medicine - ELIQUIS (apixaban)  Why was Eliquis prescribed for you? Eliquis was prescribed to treat blood clots that may have been found in the veins of your legs (deep vein thrombosis) or in your lungs (pulmonary embolism) and to reduce the risk of them occurring again.  What do You need to know about Eliquis ? The starting dose is 10 mg (two 5 mg tablets) taken TWICE daily for the FIRST SEVEN (7) DAYS, then on (enter date)  10/12/2017  the dose is reduced to ONE 5 mg tablet taken TWICE daily.  Eliquis may be taken with or without food.   Try to take the dose about the same time in the morning and in the evening. If you have difficulty swallowing the tablet whole please discuss with your pharmacist how to take the medication safely.  Take Eliquis exactly as prescribed and DO NOT stop taking Eliquis without talking to the doctor who prescribed the medication.  Stopping may increase your risk of developing a new blood clot.  Refill your prescription before you run out.  After discharge, you should have regular check-up appointments with your healthcare provider that is prescribing your Eliquis.    What do you do if you miss a dose? If a dose of ELIQUIS is not taken at the scheduled time, take it as soon as possible on the same day and twice-daily administration should be resumed. The dose should not be doubled to make up for a missed dose.  Important Safety Information A possible side effect of Eliquis is bleeding. You should call your healthcare provider right away if you experience any of the following: ? Bleeding from an injury or your nose that does not stop. ? Unusual colored urine (red or dark brown) or unusual colored stools (red or black). ? Unusual bruising for unknown reasons. ? A serious fall or if you hit your head (even if there is no bleeding).  Some medicines may interact with Eliquis and might increase your risk of bleeding or clotting while on  Eliquis. To help avoid this, consult your healthcare provider or pharmacist prior to using any new prescription or non-prescription medications, including herbals, vitamins, non-steroidal anti-inflammatory drugs (NSAIDs) and supplements.  This website has more information on Eliquis (apixaban): http://www.eliquis.com/eliquis/home

## 2017-10-07 NOTE — Progress Notes (Signed)
Educated pt on turning Q2 hours. Pt agrees and is compliant. Pt states he is able to turn self. Offered to offload with pillows. Pt refuses but does turn side to side

## 2017-10-07 NOTE — NC FL2 (Signed)
[place] LEVEL OF CARE SCREENING TOOL     IDENTIFICATION  Patient Name: Cory Ibarra Birthdate: Jan 09, 1961 Sex: male Admission Date (Current Location): 10/03/2017  [facility] [place] Number:  Herbalist and Address:  [facility],  [place] [address], [place]      Provider Number: [id number]  Attending Physician Name and Address:  Patrecia Pour, MD  Relative Name and Phone Number:       Current Level of Care: Hospital Recommended Level of Care: [place] Prior Approval Number:    Date Approved/Denied:   PASRR Number: Pending; Information not coming up in the system  Discharge Plan: SNF    Current Diagnoses: Patient Active Problem List   Diagnosis Date Noted  . Pressure injury of skin 10/04/2017  . Dehydration   . Hypoxia   . Pulmonary emboli ([place]) 10/03/2017  . B-cell lymphoma ([place])   . Healthcare-associated pneumonia   . Obstructive uropathy   . Liver function test abnormality   . Renal calculus or stone 09/19/2017  . Leakage of nephrostomy catheter ([place]) 09/19/2017  . Nephrostomy status ([place]) 09/19/2017  . Gastroesophageal reflux disease without esophagitis 09/19/2017  . Hypokalemia 09/18/2017  . Hypomagnesemia 09/18/2017  . Electrolyte abnormality   . Hypovolemia 09/17/2017  . Chemotherapy-induced nausea 09/17/2017  . Constipation 09/17/2017  . 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 Hypocalcemia 09/17/2017  . Severe protein-calorie malnutrition ([place])   . Adjustment disorder with mixed anxiety and depressed mood   . Palliative care encounter   . Admission for antineoplastic chemotherapy   . Septic shock due to Escherichia coli ([place])   . Neutropenia ([place]) 08/21/2017  . Gross hematuria   . Lactic acidosis   . Neutropenic sepsis ([place])   . Hypophosphatemia 08/20/2017  . Diffuse large B cell lymphoma ([place]) 07/03/2017  . Bony metastasis ([place]) 06/29/2017  . Chest wall mass   .  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 Closed compression fracture of L3 lumbar vertebra ([place]) 06/28/2017  . Severe back pain 06/28/2017  . Weight loss, unintentional 06/28/2017    Orientation RESPIRATION BLADDER Height & Weight     Self, Time, Situation, Place  Normal External catheter(catheter placed 10/03/17) Weight: 145 lb 8.1 oz (66 kg) Height:  6\' 2"  (188 cm)  BEHAVIORAL SYMPTOMS/MOOD NEUROLOGICAL BOWEL NUTRITION STATUS      Incontinent, Continent(incontinent at times)    AMBULATORY STATUS COMMUNICATION OF NEEDS Skin   Limited Assist Verbally PU Stage and Appropriate Care   PU Stage 2 Dressing: (Right ischial tuberosity; foam dressing, changed every 5 days)                   Personal Care Assistance Level of Assistance  Bathing, Feeding, Dressing Bathing Assistance: Limited assistance Feeding assistance: Independent Dressing Assistance: Limited assistance     Functional Limitations Info  Sight, Hearing, Speech Sight Info: Adequate Hearing Info: Adequate Speech Info: Adequate    SPECIAL CARE FACTORS FREQUENCY  PT (By licensed PT), OT (By licensed OT)     PT Frequency: 5x/wk OT Frequency: 5x/wk            Contractures Contractures Info: Not present    Additional Factors Info  Code Status, Allergies, Isolation Precautions Code Status Info: Partial Allergies Info: Bee Venom     Isolation Precautions Info: Contact precautions, MRSA     Current Medications (10/07/2017):  This is the current hospital active medication list Current Facility-Administered Medications  Medication Dose  Route Frequency Provider Last Rate Last Dose  . apixaban (ELIQUIS) tablet 10 mg  10 mg Oral BID Richmond Campbell, Student-PharmD   10 mg at 10/07/17 0931   Followed by  . [START ON 10/12/2017] apixaban (ELIQUIS) tablet 5 mg  5 mg Oral BID Richmond Campbell, Student-PharmD      . calcium gluconate 2 g in sodium chloride 0.9 % 100 mL IVPB  2 g Intravenous Q8H Patrecia Pour, MD   Stopped at 10/07/17  1014  . ceFAZolin (ANCEF) IVPB 1 g/50 mL premix  1 g Intravenous Q8H Dara Hoyer, [facility]   Stopped at 10/07/17 0865  . chlorhexidine (PERIDEX) 0.12 % solution 15 mL  15 mL Mouth Rinse BID Florencia Reasons, MD   15 mL at 10/06/17 2201  . Chlorhexidine Gluconate Cloth 2 % PADS 6 each  6 each Topical Q0600 Florencia Reasons, MD   6 each at 10/07/17 0509  . dronabinol (MARINOL) capsule 5 mg  5 mg Oral QAC lunch Florencia Reasons, MD   5 mg at 10/07/17 1242  . feeding supplement (ENSURE ENLIVE) (ENSURE ENLIVE) liquid 237 mL  237 mL Oral BID BM Vance Gather B, MD   237 mL at 10/05/17 1145  . hydrocortisone sodium succinate (SOLU-CORTEF) 100 MG injection 50 mg  50 mg Intravenous Q12H Patrecia Pour, MD   50 mg at 10/07/17 0509  . iopamidol (ISOVUE-300) 61 % injection 50 mL  50 mL Other Once PRN Markus Daft, MD      . LORazepam (ATIVAN) tablet 0.5 mg  0.5 mg Oral Q6H PRN Florencia Reasons, MD      . magnesium oxide (MAG-OX) tablet 400 mg  400 mg Oral BID Florencia Reasons, MD   400 mg at 10/07/17 0931  . mupirocin ointment (BACTROBAN) 2 % 1 application  1 application Nasal BID Florencia Reasons, MD   1 application at 78/46/96 1028  . OLANZapine zydis (ZYPREXA) disintegrating tablet 2.5 mg  2.5 mg Oral QHS Florencia Reasons, MD   2.5 mg at 10/06/17 2202  . ondansetron (ZOFRAN) tablet 8 mg  8 mg Oral Q8H PRN Florencia Reasons, MD      . oxyCODONE (Oxy IR/ROXICODONE) immediate release tablet 10 mg  10 mg Oral Q4H PRN Florencia Reasons, MD   10 mg at 10/07/17 0511  . oxyCODONE (OXYCONTIN) 12 hr tablet 10 mg  10 mg Oral Q12H Florencia Reasons, MD   10 mg at 10/07/17 0931  . pantoprazole (PROTONIX) EC tablet 40 mg  40 mg Oral Q lunch Florencia Reasons, MD   40 mg at 10/07/17 1242  . polyethylene glycol (MIRALAX / GLYCOLAX) packet 17 g  17 g Oral Daily Florencia Reasons, MD      . potassium & sodium phosphates (PHOS-NAK) 280-160-250 MG packet 1 packet  1 packet Oral TID AC & HS Florencia Reasons, MD   1 packet at 10/07/17 1312  . potassium chloride 10 mEq in 100 mL IVPB  10 mEq Intravenous Q1 Hr x 6 Vance Gather B, MD 100 mL/hr  at 10/07/17 1242 10 mEq at 10/07/17 1242  . prochlorperazine (COMPAZINE) tablet 10 mg  10 mg Oral Q6H PRN Florencia Reasons, MD      . senna-docusate (Senokot-S) tablet 1 tablet  1 tablet Oral Daily PRN Florencia Reasons, MD      . sodium chloride flush (NS) 0.9 % injection 10-40 mL  10-40 mL Intracatheter PRN Florencia Reasons, MD   10 mL at 10/05/17 0441   Facility-Administered Medications Ordered  in Other Encounters  Medication Dose Route Frequency Provider Last Rate Last Dose  . sodium chloride flush (NS) 0.9 % injection 10 mL  10 mL Intracatheter PRN Ardath Sax, MD   10 mL at 08/09/17 1702     Discharge Medications: Please see discharge summary for a list of discharge medications.  Relevant Imaging Results:  Relevant Lab Results:   Additional Information SS#: [id number]  Geralynn Ochs, LCSW

## 2017-10-07 NOTE — Progress Notes (Signed)
Took over care of patient at 1500; agree with previous RN shift assessment. Will continue to care for and monitor patient.  Carmela Hurt, RN

## 2017-10-07 NOTE — Plan of Care (Signed)
Pt reports that he had a large BM today which gave him relief. No complaints at this time.

## 2017-10-08 ENCOUNTER — Other Ambulatory Visit: Payer: Self-pay

## 2017-10-08 ENCOUNTER — Ambulatory Visit: Payer: Self-pay | Admitting: Hematology and Oncology

## 2017-10-08 ENCOUNTER — Ambulatory Visit: Payer: Self-pay

## 2017-10-08 ENCOUNTER — Telehealth: Payer: Self-pay

## 2017-10-08 DIAGNOSIS — C7951 Secondary malignant neoplasm of bone: Secondary | ICD-10-CM

## 2017-10-08 DIAGNOSIS — C8338 Diffuse large B-cell lymphoma, lymph nodes of multiple sites: Secondary | ICD-10-CM

## 2017-10-08 LAB — CBC
HCT: 28.6 % — ABNORMAL LOW (ref 39.0–52.0)
HEMOGLOBIN: 9 g/dL — AB (ref 13.0–17.0)
MCH: 25.7 pg — ABNORMAL LOW (ref 26.0–34.0)
MCHC: 31.5 g/dL (ref 30.0–36.0)
MCV: 81.7 fL (ref 78.0–100.0)
PLATELETS: 318 10*3/uL (ref 150–400)
RBC: 3.5 MIL/uL — AB (ref 4.22–5.81)
RDW: 20.7 % — ABNORMAL HIGH (ref 11.5–15.5)
WBC: 3.2 10*3/uL — ABNORMAL LOW (ref 4.0–10.5)

## 2017-10-08 LAB — CULTURE, BLOOD (ROUTINE X 2)
CULTURE: NO GROWTH
Culture: NO GROWTH
Special Requests: ADEQUATE
Special Requests: ADEQUATE

## 2017-10-08 LAB — COMPREHENSIVE METABOLIC PANEL
ALBUMIN: 2.3 g/dL — AB (ref 3.5–5.0)
ALK PHOS: 121 U/L (ref 38–126)
ALT: 18 U/L (ref 17–63)
ANION GAP: 10 (ref 5–15)
AST: 19 U/L (ref 15–41)
BUN: <5 mg/dL — ABNORMAL LOW (ref 6–20)
CALCIUM: 6.9 mg/dL — AB (ref 8.9–10.3)
CHLORIDE: 102 mmol/L (ref 101–111)
CO2: 28 mmol/L (ref 22–32)
CREATININE: 0.31 mg/dL — AB (ref 0.61–1.24)
GFR calc Af Amer: >60 mL/min (ref >60)
GFR calc non Af Amer: >60 mL/min (ref >60)
GLUCOSE: 99 mg/dL (ref 65–99)
Potassium: 3.5 mmol/L (ref 3.5–5.1)
SODIUM: 140 mmol/L (ref 135–145)
Total Bilirubin: 0.6 mg/dL (ref 0.3–1.2)
Total Protein: 4.9 g/dL — ABNORMAL LOW (ref 6.5–8.1)

## 2017-10-08 LAB — MAGNESIUM: MAGNESIUM: 1.6 mg/dL — AB (ref 1.7–2.4)

## 2017-10-08 LAB — PHOSPHORUS: PHOSPHORUS: 2.4 mg/dL — AB (ref 2.5–4.6)

## 2017-10-08 MED ORDER — HEPARIN SOD (PORK) LOCK FLUSH 100 UNIT/ML IV SOLN
500.0000 [IU] | INTRAVENOUS | Status: DC | PRN
  Administered 2017-10-08: 500 [IU]
  Filled 2017-10-08: qty 5

## 2017-10-08 MED ORDER — LORAZEPAM 0.5 MG PO TABS: 0.5000 mg | ORAL_TABLET | Freq: Four times a day (QID) | ORAL | 0 refills | Status: DC | PRN

## 2017-10-08 MED ORDER — HEPARIN SOD (PORK) LOCK FLUSH 100 UNIT/ML IV SOLN
500.0000 [IU] | INTRAVENOUS | Status: DC
  Filled 2017-10-08: qty 5

## 2017-10-08 MED ORDER — CHOLECALCIFEROL 1.25 MG (50000 UT) PO TABS: 50000.0000 [IU] | ORAL_TABLET | ORAL | 0 refills | Status: DC

## 2017-10-08 MED ORDER — PREDNISONE 10 MG PO TABS: 20.0000 mg | ORAL_TABLET | Freq: Every day | ORAL | 0 refills | Status: AC

## 2017-10-08 MED ORDER — CALCIUM CARBONATE ANTACID 1000 MG PO CHEW: 1000.0000 mg | CHEWABLE_TABLET | Freq: Three times a day (TID) | ORAL | Status: DC

## 2017-10-08 MED ORDER — OXYCODONE HCL ER 10 MG PO T12A: 10.0000 mg | EXTENDED_RELEASE_TABLET | Freq: Two times a day (BID) | ORAL | 0 refills | Status: DC

## 2017-10-08 MED ORDER — CEFPODOXIME PROXETIL 200 MG PO TABS
200.0000 mg | ORAL_TABLET | Freq: Two times a day (BID) | ORAL | 0 refills | Status: AC
Start: 2017-10-08 — End: 2017-10-13

## 2017-10-08 MED ORDER — APIXABAN 5 MG PO TABS: ORAL_TABLET | ORAL | 0 refills | Status: DC

## 2017-10-08 MED ORDER — OXYCODONE HCL 10 MG PO TABS: 10.0000 mg | ORAL_TABLET | ORAL | 0 refills | Status: DC | PRN

## 2017-10-08 MED ORDER — CEFPODOXIME PROXETIL 200 MG PO TABS
200.0000 mg | ORAL_TABLET | Freq: Two times a day (BID) | ORAL | Status: DC
  Administered 2017-10-08: 200 mg via ORAL
  Filled 2017-10-08: qty 1

## 2017-10-08 MED ORDER — DRONABINOL 5 MG PO CAPS: 5.0000 mg | ORAL_CAPSULE | Freq: Every day | ORAL | 0 refills | Status: DC

## 2017-10-08 NOTE — Progress Notes (Signed)
IP PROGRESS NOTE  Subjective:  No significant new problems over the weekend.  Patient has good appetite and had a excellent bowel movement yesterday although did develop some nausea afterwards.  Presently, no nausea.  No recurrent fever  Objective: Vital signs in last 24 hours: Blood pressure (!) 97/48, pulse 86, temperature 98.4 F (36.9 C), temperature source Oral, resp. rate 14, height 6\' 2"  (1.88 m), weight 145 lb 8.1 oz (66 kg), SpO2 98 %.  Intake/Output from previous day: 11/04 0701 - 11/05 0700 In: 990 [IV Piggyback:990] Out: 1500 [Urine:670; Drains:830]  Physical Exam:  Cachectic, alert, awake oriented 3. HEENT: Anicteric sclera, moist mucous membranes. Lungs: Clear to auscultation bilaterally. Cardiac: S1/S2, regular, no murmurs. Abdomen: Soft, nontender, nondistended. Bowel sounds are normoactive. Extremities: No lower extremity edema  Portacath/PICC-without erythema  Lab Results: Recent Labs    10/07/17 0356 10/08/17 0658  WBC 2.7* 3.2*  HGB 8.6* 9.0*  HCT 27.2* 28.6*  PLT 336 318    BMET Recent Labs    10/06/17 1510 10/07/17 0356  NA 136 138  K 3.1* 3.3*  CL 98* 101  CO2 29 29  GLUCOSE 105* 90  BUN <5* <5*  CREATININE <0.30* <0.30*  CALCIUM 6.1* 7.0*    Lab Results  Component Value Date   CEA1 0.7 06/28/2017    Studies/Results: No results found.  Medications: I have reviewed the patient's current medications.  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 Currently admitted for PE, pneumonia and possible recurrent UTI.  Patient remains afebrile and has been transitioned to oral antibiotics and  anticoagulation.  Recommendations: --Discharge back to skilled nursing facility for continued rehabilitation at discretion of the primary service --I would like patient to undergo a PET/CT later this week if possible --I would like to see patient back in my clinic next Monday for labs, clinic visit, and possible third cycle of systemic chemotherapy depending on results of the PET/CT .  LOS: 5 days   Ardath Sax, MD   10/08/2017, 9:41 AM

## 2017-10-08 NOTE — Discharge Summary (Signed)
Physician Discharge Summary  Silver Parkey HWE:[id number] DOB: 1961/11/29 DOA: 10/03/2017  PCP: Cory Jun, [name]  Admit date: 10/03/2017 Discharge date: 10/08/2017  [facility] From[facility]position: [facility]   Recommendations for Outpatient Follow-up:  1. Follow up with PCP in 1-2 weeks 2. Monitor Ca, Mg, Phos, K closely and continue daily replacement as below.  3. Oncology, Dr. [name] requests that the patient undergo a PET/CT later this week if possible 4. Follow up with Oncology, Dr. [name], next Monday for labs, clinic visit, and possible third cycle of systemic chemotherapy depending on results of the PET/CT 5. Monitor BP on continued stress steroids, prednisone 20mg  (Dr. [name] to address taper at follow up). Will put on PCP prophylaxis as below. 6. Follow up with urology, Dr. Matilde Sprang in the next 2 - 3 weeks for further care of ureterolithiasis. Continuing foley catheter and percutaneous nephrostomy tube until that time.  Home Health: N/A Equipment/Devices: Percutaneous nephrostomy tube and foley catheter. Discharge Condition: Stable CODE STATUS: Partial In the event of cardiac or respiratory ARREST: Initiate Code Blue Yes  In the event of cardiac or respiratory ARREST: Perform CPR Yes  In the event of cardiac or respiratory ARREST: Perform Intubation/Mechanical Ventilation No  In the event of cardiac or respiratory ARREST: Use NIPPV/BiPAP if indicated Yes  In the event of cardiac or respiratory ARREST: Administer ACLS medications if indicated Yes  In the event of cardiac or respiratory ARREST: Perform Defibrillation or Cardioversion if indicated Yes   Diet recommendation: Regular  Brief/Interim Summary: Cory Ibarra a 56 y.o.male with a history of DLBCL undergoing chemotherapy and right ureterolithiasis with nephrostomy tube who presented 10/31 from SNF due to concern for UTI with fever though he was found to be hypoxic in the ED and CTA demonstrated a small  PE with infiltrate. WBC was 3.4, hgb 9.2 near baseline. UA with nitrite positive pyuria. Vancomycin and cefepime were started. He had been failing to thrive while undergoing chemotherapy causing him to require SNF. Oncology has been giving reduced doses of chemotherapy. Antibiotics tailored to culture results, continued by IV for >72 hours per oncology recommendations. Electrolyte derangements have been difficult to manage, consistent with prior history, but are stable at time of discharge.   Discharge Diagnoses:  Active Problems:   Pulmonary emboli (HCC)   B-cell lymphoma ([place])   Healthcare-associated pneumonia   Obstructive uropathy   Pressure injury of skin   Dehydration   Hypoxia  Acute hypoxic respiratory failure: Due to PE and pneumonia. Resolved.  Acute EL:FYBOFBPZWCHE within the LLL and lingula with minimal clot burden no evidence of RV strain. - Transitioned heparin gtt > eliquis 11/2 and is stable.    Sepsis due to LLL pneumonia and possible UTI: - Parenteral abx continued over the weekend. Pt has remained afebrile. Blood cultures negative. Transitioned to vantin with test dose prior to discharge to ensure tolerance of the pill.  - Urine culture with 50k GNR's, plan to treat with complications of nephrostomy tube and immunocompromise.   Adrenal insufficiency: - Continue stress-steroids at prednisone 20mg  with bactrim PCP ppx per oncology rec's.    Ureterolithiasis: Stone in proximal/mid right ureter with incomplete obstruction evidenced by delayed emptying of contrast distal to this stone seen during nephrostomy drain exchange 11/1.  - Continue nephrostomy tube, definitive management outside scope of current illness per urology.  - Continuing foley catheter for now, pending urology recommendations.   Hypokalemia: Recurrent, replacing per orders. - Check BMP, Mg, Phos daily and replace aggressively  as indicated. Giving additional IV potassium for K of 3.3. Continue po  supplementation of magnesium and phosphorus.  Hypocalcemia: Ca improved, still low (8.4 with correction), but usually wouldn't require repletion. With his refractory hypocalcemia in the past, I will continue repletion with TID calcium carbonate chews and change vitamin D to weekly dosing.     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 Counts stable.  - Oncology consulted.  - Pain controlled with oxycodone, continue at discharge.   FTT: - PT eval, recommending DC back to SNF  - Continue marinol for appetite stimulation  Discharge Instructions  Allergies as of 10/08/2017      Reactions   Bee Venom Swelling      Medication List    STOP taking these medications   calcium-vitamin D 500-200 MG-UNIT tablet Commonly known as:  OSCAL WITH D   enoxaparin 40 MG/0.4ML injection Commonly known as:  LOVENOX   ondansetron 8 MG tablet Commonly known as:  ZOFRAN     TAKE these medications   apixaban 5 MG Tabs tablet Commonly known as:  ELIQUIS Take 2 tabs po BID x7 days, then take 1 tab po BID   calcium elemental as carbonate 400 MG chewable tablet Commonly known as:  BARIATRIC TUMS ULTRA Chew 3 tablets (1,200 mg total) 3 (three) times daily by mouth.   cefpodoxime 200 MG tablet Commonly known as:  VANTIN Take 1 tablet (200 mg total) every 12 (twelve) hours for 5 days by mouth.   chlorhexidine 0.12 % solution Commonly known as:  PERIDEX 15 mLs by Mouth Rinse route 2 (two) times daily.   Cholecalciferol 50000 units Tabs Take 50,000 Units once a week by mouth. for 8 weeks, then return for level recheck   dronabinol 5 MG capsule Commonly known as:  MARINOL Take 1 capsule (5 mg total) daily before lunch by mouth.   Lidocaine-Prilocaine (Bulk) 2.5-2.5 % Crea Apply to affected areas topically one time daily   LORazepam 0.5 MG tablet Commonly known as:  ATIVAN Take 1 tablet (0.5 mg total) every 6 (six) hours as needed for up to 14  days by mouth (Nausea or vomiting).   magnesium oxide 400 (241.3 Mg) MG tablet Commonly known as:  MAG-OX Take 1 tablet (400 mg total) by mouth 2 (two) times daily.   multivitamin with minerals Tabs tablet Take 1 tablet by mouth daily.   OLANZapine zydis 5 MG disintegrating tablet Commonly known as:  ZYPREXA Take 0.5 tablets (2.5 mg total) by mouth at bedtime.   oxyCODONE 10 mg 12 hr tablet Commonly known as:  OXYCONTIN Take 1 tablet (10 mg total) every 12 (twelve) hours by mouth.   Oxycodone HCl 10 MG Tabs Take 1 tablet (10 mg total) every 4 (four) hours as needed by mouth (for moderate to severe pain).   pantoprazole 40 MG tablet Commonly known as:  PROTONIX Take 40 mg by mouth daily with lunch.   polyethylene glycol packet Commonly known as:  MIRALAX / GLYCOLAX Take 17 g by mouth daily as needed for mild constipation. What changed:  when to take this   potassium & sodium phosphates 280-160-250 MG Pack Commonly known as:  PHOS-NAK Take 1 packet by mouth 4 (four) times daily -  before meals and at bedtime.   predniSONE 10 MG tablet Commonly known as:  DELTASONE Take 2 tablets (20 mg total) daily with breakfast by mouth.   prochlorperazine 10 MG tablet Commonly known as:  COMPAZINE Take 1  tablet (10 mg total) by mouth every 6 (six) hours as needed for nausea or vomiting.   senna-docusate 8.6-50 MG tablet Commonly known as:  Senokot-S Take 1 tablet by mouth daily as needed for mild constipation.   sulfamethoxazole-trimethoprim 200-40 MG/5ML suspension Commonly known as:  BACTRIM,SEPTRA Take 20 mLs by mouth every Monday, Wednesday, and Friday. Continuous What changed:  Another medication with the same name was removed. Continue taking this medication, and follow the directions you see here.      Follow-up Information    Cory Jun, [name] Follow up.   Specialty:  Family Medicine Contact information: [place] [address] [phone]         Ardath Sax, MD Follow up in 1 week(s).   Specialty:  Hematology and Oncology Contact information: [place] [address] [phone]        Bjorn Loser, MD. Schedule an appointment as soon as possible for a visit in 2 week(s).   Specialty:  Urology Contact information: [address]  [place] [address] [phone]          Allergies  Allergen Reactions  . Bee Venom Swelling    Consultations:  Oncology, Dr. Kirtland Bouchard, [facility]  Urology, Mac[facility]armid  Procedures/Studies: Dg Chest 2 View  Result Date: 10/03/2017 CLINICAL DATA:  Shortness of breath. EXAM: CHEST  2 VIEW COMPARISON:  Radiographs of August 21, 2017. FINDINGS: The heart size and mediastinal contours are within normal limits. Both lungs are clear. No pneumothorax or pleural effusion is noted. Right internal jugular Port-A-Cath is unchanged in position. Hyperexpansion of the lungs is noted. The visualized skeletal structures are unremarkable. IMPRESSION: No active cardiopulmonary disease. Electronically Signed   By: Marijo Conception, M.D.   On: 10/03/2017 10:20   Ct Angio Chest Pe W And/or Wo Contrast  Result Date: 10/03/2017 CLINICAL DATA:  History of lymphoma. There a tract infection. Short of breath. Hypoxia EXAM: CT ANGIOGRAPHY CHEST WITH CONTRAST TECHNIQUE: Multidetector CT imaging of the chest was performed using the standard protocol during bolus administration of intravenous contrast. Multiplanar CT image reconstructions and MIPs were obtained to evaluate the vascular anatomy. CONTRAST:  80 mL Isovue COMPARISON:  None. FINDINGS: Cardiovascular: There 2 small tubular filling defects within the proximal LEFT lower lobe pulmonary artery and proximal lingular pulmonary artery (image 61, series 5). These are consistent with thromboemboli. These are nonocclusive. No additional filling defects identified within the pulmonary arteries bold No acute findings aorta great vessels. Heart is  normal. No evidence of RIGHT ventricular strain with the RIGHT ventricular to LEFT ventricular diameter ratio less than 1. Mediastinum/Nodes: No axillary or supraclavicular adenopathy. No mediastinal hilar adenopathy. No pericardial fluid. Esophagus normal Lungs/Pleura: Within the LEFT lower lobe there is patchy nodular airspace disease involving the superior segment and the basilar segments. There is some consolidation of basilar segments. There is frothy material within the lower lobe bronchi (image 76, series 8). RIGHT lung is clear. Upper Abdomen: Limited view of the liver, kidneys, pancreas are unremarkable. Normal adrenal glands. Musculoskeletal: Again demonstrated lytic and expansile lesions within the posterior LEFT ribs and LEFT scapula (image 45, series 5 and image 56, series 5). Findings consistent bone metastasis previously described. Review of the MIP images confirms the above findings. IMPRESSION: 1. Acute pulmonary embolism within the LEFT lower lobe and lingula. These small emboli are nonocclusive. Overall clot burden is minimal. 2. No evidence of RIGHT ventricular strain. 3. Patchy airspace disease in the  LEFT lower lobe. Favor pulmonary infection such as pneumonia or aspiration rather than pulmonary infarction. 4. Frothy material within the LEFT lower lobe bronchus could represent aspirate from tracheal aspiration. Findings conveyed toNATHAN Ibarra on 10/03/2017  at12:55. Electronically Signed   By: Suzy Bouchard M.D.   On: 10/03/2017 13:00   Ir Nephrostomy Exchange Right  Result Date: 10/04/2017 INDICATION: 56 year old with a right ureter stone and right percutaneous nephrostomy. Concern for infection. Request for a drain exchange. EXAM: RIGHT NEPHROSTOMY TUBE EXCHANGE WITH FLUOROSCOPY Physician: Stephan Minister. Anselm Pancoast, MD COMPARISON:  None. MEDICATIONS: None ANESTHESIA/SEDATION: None CONTRAST:  32mL ISOVUE-300 IOPAMIDOL (ISOVUE-300) INJECTION 61% - administered into the collecting system(s)  FLUOROSCOPY TIME:  Fluoroscopy Time: 30 seconds, 6 mGy COMPLICATIONS: None immediate. PROCEDURE: The procedure was explained to the patient. The risks and benefits of the procedure were discussed and the patient's questions were addressed. Informed consent was obtained from the patient. Right flank was prepped and draped in sterile fashion. Maximal barrier sterile technique was utilized including caps, mask, sterile gowns, sterile gloves, sterile drape, hand hygiene and skin antiseptic. Contrast was injected through the nephrostomy tube. Catheter was cut and removed over a Bentson wire. New 10.2 [place] multipurpose drain was reconstituted in the renal pelvis. Contrast was injected to confirm placement. Skin was anesthetized with 1% lidocaine. Catheter was sutured to skin with Prolene suture. Fluoroscopic images were taken and saved for this procedure. FINDINGS: Nephrostomy tube is well positioned within the right renal pelvis. Again noted is a stone in the proximal/mid right ureter. Delayed emptying of contrast distal to this stone. IMPRESSION: Successful exchange of the right percutaneous nephrostomy tube with fluoroscopy. Electronically Signed   By: Markus Daft M.D.   On: 10/04/2017 09:20   Dg Fluoro Guided Loc Of Needle/cath Tip For Spinal Inject Lt  Result Date: 09/18/2017 CLINICAL DATA:  B-cell lymphoma.  For intrathecal chemotherapy EXAM: DIAGNOSTIC LUMBAR PUNCTURE UNDER FLUOROSCOPIC GUIDANCE FLUOROSCOPY TIME:  Fluoroscopy Time:  0 minutes 16 second Radiation Exposure Index (if provided by the fluoroscopic device): Number of Acquired Spot Images: 0 PROCEDURE: Informed consent was obtained from the patient prior to the procedure, including potential complications of headache, allergy, and pain. With the patient prone, the lower back was prepped with Betadine. 1% Lidocaine was used for local anesthesia. Lumbar puncture was performed at the L4-5 level using a 20 gauge needle with return of slightly  blood-tinged CSF which cleared, with an opening pressure of 9 cm water. 13.5 ml of CSF were obtained for laboratory studies. 12 mg of methotrexate was injected into the subarachnoid space. The patient tolerated the procedure well and there were no apparent complications. IMPRESSION: Successful lumbar puncture and injection of intrathecal methotrexate Electronically Signed   By: Franchot Gallo M.D.   On: 09/18/2017 13:24    Nephrostomy exchange 11/1  Subjective: In high spirits, sat in the chair most of yesterday, eating well. No fever. Large BM.   Discharge Exam: Vitals:   10/07/17 2028 10/08/17 0457  BP: (!) 99/58 (!) 97/48  Pulse: 75 86  Resp: 16 14  Temp: 98.2 F (36.8 C) 98.4 F (36.9 C)  SpO2: 99% 98%   General: Pt is alert, awake, not in acute distress Cardiovascular: RRR, S1/S2 +, no rubs, no gallops Respiratory: CTA bilaterally, no wheezing, no rhonchi Abdominal: Soft, NT, ND, bowel sounds + Extremities: No edema, no cyanosis MSK: Decreased muscle bulk diffusely. Tenderness to palpation of lower midline back, negative straight leg raise.  Skin: No rashes, lesions no ulcers.  No bruising/bleeding. PCN tube site c/d/i nontender.  Labs: BNP (last 3 results) Recent Labs    08/21/17 2249  BNP 417.4*   Basic Metabolic Panel: Recent Labs  Lab 10/03/17 0930  10/05/17 0434 10/06/17 0343 10/06/17 1510 10/07/17 0356 10/08/17 0658  NA 139   < > 140 140 136 138 140  K 3.0*   < > 3.0* 2.5* 3.1* 3.3* 3.5  CL 103   < > 108 104 98* 101 102  CO2 20*   < > 24 27 29 29 28   GLUCOSE 97   < > 92 91 105* 90 99  BUN 10   < > 9 <5* <5* <5* <5*  CREATININE 0.42*   < > 0.36* <0.30* <0.30* <0.30* 0.31*  CALCIUM 6.9*   < > 6.4* 6.1* 6.1* 7.0* 6.9*  MG 2.0  --  1.7 1.5*  --  1.9 1.6*  PHOS 1.6*  --  1.8* 2.2*  --  2.2* 2.4*   < > = values in this interval not displayed.   Liver Function Tests: Recent Labs  Lab 10/01/17 1407 10/03/17 0930 10/04/17 0450 10/07/17 0356 10/08/17 0658   AST 29 19 14* 22 19  ALT 48 31 23 21 18   ALKPHOS 186* 158* 126 115 121  BILITOT 1.19 1.6* 1.1 0.8 0.6  PROT 6.2* 5.9* 5.1* 4.8* 4.9*  ALBUMIN 2.6* 2.8* 2.2* 2.3* 2.3*   No results for input(s): LIPASE, AMYLASE in the last 168 hours. No results for input(s): AMMONIA in the last 168 hours. CBC: Recent Labs  Lab 10/01/17 1407  10/03/17 0930 10/04/17 0450 10/05/17 0434 10/06/17 0343 10/07/17 0356 10/08/17 0658  WBC 1.3*   < > 3.4* 2.4* 2.9* 3.0* 2.7* 3.2*  NEUTROABS 0.7*  --  2.0  --   --   --   --   --   HGB 9.9*   < > 9.2* 7.7* 7.8* 8.1* 8.6* 9.0*  HCT 30.7*   < > 29.9* 24.9* 24.6* 25.3* 27.2* 28.6*  MCV 77.5*   < > 80.8 81.9 80.7 80.3 81.2 81.7  PLT 366   < > 415* 345 329 331 336 318   < > = values in this interval not displayed.   Cardiac Enzymes: No results for input(s): CKTOTAL, CKMB, CKMBINDEX, TROPONINI in the last 168 hours. BNP: Invalid input(s): POCBNP CBG: No results for input(s): GLUCAP in the last 168 hours. D-Dimer No results for input(s): DDIMER in the last 72 hours. Hgb A1c No results for input(s): HGBA1C in the last 72 hours. Lipid Profile No results for input(s): CHOL, HDL, LDLCALC, TRIG, CHOLHDL, LDLDIRECT in the last 72 hours. Thyroid function studies No results for input(s): TSH, T4TOTAL, T3FREE, THYROIDAB in the last 72 hours.  Invalid input(s): FREET3 Anemia work up No results for input(s): VITAMINB12, FOLATE, FERRITIN, TIBC, IRON, RETICCTPCT in the last 72 hours. Urinalysis    Component Value Date/Time   COLORURINE AMBER (A) 10/03/2017 1228   APPEARANCEUR CLOUDY (A) 10/03/2017 1228   LABSPEC 1.022 10/03/2017 1228   PHURINE 5.0 10/03/2017 1228   GLUCOSEU NEGATIVE 10/03/2017 1228   HGBUR LARGE (A) 10/03/2017 1228   BILIRUBINUR NEGATIVE 10/03/2017 1228   KETONESUR 80 (A) 10/03/2017 1228   PROTEINUR >=300 (A) 10/03/2017 1228   UROBILINOGEN 1.0 07/06/2017 1029   NITRITE NEGATIVE 10/03/2017 1228   LEUKOCYTESUR MODERATE (A) 10/03/2017 1228     Microbiology Recent Results (from the past 240 hour(s))  Urine C&S     Status: Abnormal   Collection  Time: 10/03/17 12:28 PM  Result Value Ref Range Status   Specimen Description URINE, CATHETERIZED  Final   Special Requests Immunocompromised  Final   Culture 50,000 COLONIES/mL ESCHERICHIA COLI (A)  Final   Report Status 10/05/2017 FINAL  Final   Organism ID, Bacteria ESCHERICHIA COLI (A)  Final      Susceptibility   Escherichia coli - MIC*    AMPICILLIN 4 SENSITIVE Sensitive     CEFAZOLIN <=4 SENSITIVE Sensitive     CEFTRIAXONE <=1 SENSITIVE Sensitive     CIPROFLOXACIN <=0.25 SENSITIVE Sensitive     GENTAMICIN <=1 SENSITIVE Sensitive     IMIPENEM <=0.25 SENSITIVE Sensitive     NITROFURANTOIN <=16 SENSITIVE Sensitive     TRIMETH/SULFA <=20 SENSITIVE Sensitive     AMPICILLIN/SULBACTAM <=2 SENSITIVE Sensitive     PIP/TAZO <=4 SENSITIVE Sensitive     Extended ESBL NEGATIVE Sensitive     * 50,000 COLONIES/mL ESCHERICHIA COLI  Culture, blood (routine x 2)     Status: None (Preliminary result)   Collection Time: 10/03/17  4:50 PM  Result Value Ref Range Status   Specimen Description BLOOD LEFT ANTECUBITAL  Final   Special Requests   Final    BOTTLES DRAWN AEROBIC ONLY Blood Culture adequate volume   Culture   Final    NO GROWTH 4 DAYS Performed at [facility], [address] [place], [place] [address]    Report Status PENDING  Incomplete  Culture, blood (routine x 2)     Status: None (Preliminary result)   Collection Time: 10/03/17  4:50 PM  Result Value Ref Range Status   Specimen Description BLOOD RIGHT ANTECUBITAL  Final   Special Requests   Final    BOTTLES DRAWN AEROBIC ONLY Blood Culture adequate volume   Culture   Final    NO GROWTH 4 DAYS Performed at [facility], [place] [address] [place], [place] [address]    Report Status PENDING  Incomplete  MRSA PCR Screening     Status: Abnormal   Collection Time: 10/03/17  5:03 PM  Result Value Ref Range  Status   MRSA by PCR POSITIVE (A) NEGATIVE Final    Comment:        The GeneXpert MRSA Assay (FDA approved for NASAL specimens only), is one component of a comprehensive MRSA colonization surveillance program. It is not intended to diagnose MRSA infection nor to guide or monitor treatment for MRSA infections. RESULT CALLED TO, READ BACK BY AND VERIFIED WITH: Carlis Stable [id number] @ 2109 BY J SCOTTON     Time coordinating discharge: Approximately 40 minutes  Vance Gather, MD  [facility] 10/08/2017, 10:40 AM Pager [phone]

## 2017-10-08 NOTE — Progress Notes (Addendum)
CSW assisting with discharge back to [facility]. Patient brother aware pt. Will return today.  Patient will transport by [facility].(waiting for call from RN) Nurse call report to:[phone] 12:50pm [facility] called for transport.

## 2017-10-08 NOTE — Progress Notes (Signed)
Pt to be discharged back to [facility] this afternoon. Report called to this facility and Pedro Earls RN accepting report for this facility. VSS and no acute changes noted with Pt's assessment at time of discharge.

## 2017-10-08 NOTE — NC FL2 (Signed)
[place] LEVEL OF CARE SCREENING TOOL     IDENTIFICATION  Patient Name: Cory Ibarra Birthdate: [birth year] Sex: male Admission Date (Current Location): 10/03/2017  [facility] and [place] Number:  Herbalist and Address:  [facility],  [address] [place], [place]      Provider Number: [id number]  Attending Physician Name and Address:  Patrecia Pour, MD  Relative Name and Phone Number:       Current Level of Care: Hospital Recommended Level of Care: [place] Prior Approval Number:    Date Approved/Denied:   PASRR Number: [id number]   Discharge Plan: SNF    Current Diagnoses: Patient Active Problem List   Diagnosis Date Noted  . Pressure injury of skin 10/04/2017  . Dehydration   . Hypoxia   . Pulmonary emboli ([place]) 10/03/2017  . B-cell lymphoma ([place])   . Healthcare-associated pneumonia   . Obstructive uropathy   . Liver function test abnormality   . Renal calculus or stone 09/19/2017  . Leakage of nephrostomy catheter ([place]) 09/19/2017  . Nephrostomy status ([place]) 09/19/2017  . Gastroesophageal reflux disease without esophagitis 09/19/2017  . Hypokalemia 09/18/2017  . Hypomagnesemia 09/18/2017  . Electrolyte abnormality   . Hypovolemia 09/17/2017  . Chemotherapy-induced nausea 09/17/2017  . Constipation 09/17/2017  . DLBCL (diffuse large B cell lymphoma) ([place]) 09/17/2017  . Hypocalcemia 09/17/2017  . Severe protein-calorie malnutrition ([place])   . Adjustment disorder with mixed anxiety and depressed mood   . Palliative care encounter   . Admission for antineoplastic chemotherapy   . Septic shock due to Escherichia coli ([place])   . Neutropenia ([place]) 08/21/2017  . Gross hematuria   . Lactic acidosis   . Neutropenic sepsis ([place])   . Hypophosphatemia 08/20/2017  . Diffuse large B cell lymphoma ([place]) 07/03/2017  . Bony metastasis ([place]) 06/29/2017  . Chest wall mass   . 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  Closed compression fracture of L3 lumbar vertebra ([place]) 06/28/2017  . Severe back pain 06/28/2017  . Weight loss, unintentional 06/28/2017    Orientation RESPIRATION BLADDER Height & Weight     Self, Time, Situation, Place  Normal External catheter(catheter placed 10/03/17) Weight: 145 lb 8.1 oz (66 kg) Height:  6\' 2"  (188 cm)  BEHAVIORAL SYMPTOMS/MOOD NEUROLOGICAL BOWEL NUTRITION STATUS      Incontinent, Continent(incontinent at times)    AMBULATORY STATUS COMMUNICATION OF NEEDS Skin   Limited Assist Verbally PU Stage and Appropriate Care   PU Stage 2 Dressing: (Right ischial tuberosity; foam dressing, changed every 5 days)                   Personal Care Assistance Level of Assistance  Bathing, Feeding, Dressing Bathing Assistance: Limited assistance Feeding assistance: Independent Dressing Assistance: Limited assistance     Functional Limitations Info  Sight, Hearing, Speech Sight Info: Adequate Hearing Info: Adequate Speech Info: Adequate    SPECIAL CARE FACTORS FREQUENCY  PT (By licensed PT), OT (By licensed OT)     PT Frequency: 5x/wk OT Frequency: 5x/wk            Contractures Contractures Info: Not present    Additional Factors Info  Code Status, Allergies, Isolation Precautions Code Status Info: Partial Allergies Info: Bee Venom     Isolation Precautions Info: Contact precautions, MRSA     Current Medications (10/08/2017):  This is the current hospital active medication list Current Facility-Administered Medications  Medication Dose Route Frequency Provider Last Rate Last  Dose  . apixaban (ELIQUIS) tablet 10 mg  10 mg Oral BID Richmond Campbell, Student-PharmD   10 mg at 10/08/17 0908   Followed by  . [START ON 10/12/2017] apixaban (ELIQUIS) tablet 5 mg  5 mg Oral BID Richmond Campbell, Student-PharmD      . cefpodoxime (VANTIN) tablet 200 mg  200 mg Oral Q12H Patrecia Pour, MD      . chlorhexidine (PERIDEX) 0.12 % solution 15 mL  15 mL Mouth Rinse BID Florencia Reasons, MD   15 mL at 10/08/17 0908  . dronabinol (MARINOL) capsule 5 mg  5 mg Oral QAC lunch Florencia Reasons, MD   5 mg at 10/07/17 1242  . feeding supplement (ENSURE ENLIVE) (ENSURE ENLIVE) liquid 237 mL  237 mL Oral BID BM Vance Gather B, MD   237 mL at 10/05/17 1145  . hydrocortisone sodium succinate (SOLU-CORTEF) 100 MG injection 50 mg  50 mg Intravenous Q12H Patrecia Pour, MD   50 mg at 10/08/17 0459  . iopamidol (ISOVUE-300) 61 % injection 50 mL  50 mL Other Once PRN Markus Daft, MD      . LORazepam (ATIVAN) tablet 0.5 mg  0.5 mg Oral Q6H PRN Florencia Reasons, MD      . magnesium oxide (MAG-OX) tablet 400 mg  400 mg Oral BID Florencia Reasons, MD   400 mg at 10/08/17 0908  . OLANZapine zydis (ZYPREXA) disintegrating tablet 2.5 mg  2.5 mg Oral QHS Florencia Reasons, MD   2.5 mg at 10/07/17 2148  . ondansetron (ZOFRAN) tablet 8 mg  8 mg Oral Q8H PRN Florencia Reasons, MD      . oxyCODONE (Oxy IR/ROXICODONE) immediate release tablet 10 mg  10 mg Oral Q4H PRN Florencia Reasons, MD   10 mg at 10/07/17 2039  . oxyCODONE (OXYCONTIN) 12 hr tablet 10 mg  10 mg Oral Q12H Florencia Reasons, MD   10 mg at 10/08/17 0908  . pantoprazole (PROTONIX) EC tablet 40 mg  40 mg Oral Q lunch Florencia Reasons, MD   40 mg at 10/07/17 1242  . polyethylene glycol (MIRALAX / GLYCOLAX) packet 17 g  17 g Oral Daily Florencia Reasons, MD      . potassium & sodium phosphates (PHOS-NAK) 280-160-250 MG packet 1 packet  1 packet Oral TID AC & HS Florencia Reasons, MD   1 packet at 10/08/17 [phone]  . prochlorperazine (COMPAZINE) tablet 10 mg  10 mg Oral Q6H PRN Florencia Reasons, MD      . senna-docusate (Senokot-S) tablet 1 tablet  1 tablet Oral Daily PRN Florencia Reasons, MD      . sodium chloride flush (NS) 0.9 % injection 10-40 mL  10-40 mL Intracatheter PRN Florencia Reasons, MD   10 mL at 10/05/17 0441   Facility-Administered Medications Ordered in Other Encounters  Medication Dose Route Frequency Provider Last Rate Last Dose  . sodium chloride flush (NS) 0.9 % injection 10 mL  10 mL Intracatheter PRN Ardath Sax, MD   10 mL at  08/09/17 1702     Discharge Medications: Please see discharge summary for a list of discharge medications.  Relevant Imaging Results:  Relevant Lab Results:   Additional Information SS#: [id number]  Lia Hopping, LCSW

## 2017-10-08 NOTE — Telephone Encounter (Signed)
Spoke with Manuela Schwartz in [place] to have pt scheduled for PET scan this week. Correct order confirmed. Central Scheduling to call pt. Scheduling message sent to have pt scheduled for lab and doctor visit on 11/12. Orders placed for labs on 11/12.

## 2017-10-09 ENCOUNTER — Telehealth: Payer: Self-pay | Admitting: Hematology and Oncology

## 2017-10-09 ENCOUNTER — Encounter: Payer: Self-pay | Admitting: Adult Health

## 2017-10-09 ENCOUNTER — Non-Acute Institutional Stay (SKILLED_NURSING_FACILITY): Payer: Medicaid Other | Admitting: Adult Health

## 2017-10-09 ENCOUNTER — Telehealth: Payer: Self-pay

## 2017-10-09 DIAGNOSIS — N200 Calculus of kidney: Secondary | ICD-10-CM

## 2017-10-09 DIAGNOSIS — C7951 Secondary malignant neoplasm of bone: Secondary | ICD-10-CM | POA: Diagnosis not present

## 2017-10-09 DIAGNOSIS — S32030D Wedge compression fracture of third lumbar vertebra, subsequent encounter for fracture with routine healing: Secondary | ICD-10-CM

## 2017-10-09 DIAGNOSIS — C8338 Diffuse large B-cell lymphoma, lymph nodes of multiple sites: Secondary | ICD-10-CM | POA: Diagnosis not present

## 2017-10-09 DIAGNOSIS — J189 Pneumonia, unspecified organism: Secondary | ICD-10-CM | POA: Diagnosis not present

## 2017-10-09 DIAGNOSIS — F4323 Adjustment disorder with mixed anxiety and depressed mood: Secondary | ICD-10-CM | POA: Diagnosis not present

## 2017-10-09 DIAGNOSIS — I2699 Other pulmonary embolism without acute cor pulmonale: Secondary | ICD-10-CM | POA: Diagnosis not present

## 2017-10-09 NOTE — Telephone Encounter (Signed)
Communication with Manuela Schwartz in [employer]. Pt has appt for f/u with labs on 11/12 with Dr. [name] and PET scan scheduled for 11/16. Rescheduled with [facility] for 11/12 at 0730.

## 2017-10-09 NOTE — Telephone Encounter (Signed)
Called [facility] - left message with Sherrie Sport - scheduled appt per 11/5 sch message.

## 2017-10-09 NOTE — Progress Notes (Signed)
Location:   [place] Room Number: 131 A Place of Service:  SNF (31)   CODE STATUS: Full Code  Allergies  Allergen Reactions  . Bee Venom Swelling    Chief Complaint  Patient presents with  . Hospitalization Follow-up    Hospital Follow up    HPI:  He is a short term resident of this facility who has been hospitalized for acute hypoxic respiratory failure due to PE and pneumonia; sepsis due to LLL pneumonia. His blood cultures were negative. He has been started on eliquis for his PE; he did have his nephrostomy stent exchanged while hospitalized. Today he tells me that he is feeling better;he does have weakness present. He denies any chest pain and shortness of breath. His goal remains for him to return back home.  He had I have had a prolonged discussion about his overall status. He remains very hopeful that he will continue to improve. He has agreed to a palliative care consult.   Past Medical History:  Diagnosis Date  . Cancer ([place])   . Tendonitis     Past Surgical History:  Procedure Laterality Date  . HERNIA REPAIR  1988   Dr. Vida Rigger  . IR FLUORO GUIDE PORT INSERTION RIGHT  07/26/2017  . IR NEPHROSTOMY EXCHANGE RIGHT  10/04/2017  . IR NEPHROSTOMY PLACEMENT RIGHT  08/21/2017  . IR US GUIDE VASC ACCESS RIGHT  07/26/2017    Social History   Socioeconomic History  . Marital status: Single    Spouse name: Not on file  . Number of children: 0  . Years of education: Not on file  . Highest education level: Not on file  Social Needs  . Financial resource strain: Not on file  . Food insecurity - worry: Not on file  . Food insecurity - inability: Not on file  . Transportation needs - medical: Not on file  . Transportation needs - non-medical: Not on file  Occupational History  . Not on file  Tobacco Use  . Smoking status: Never Smoker  . Smokeless tobacco: Never Used  Substance and Sexual Activity  . Alcohol use: Yes    Comment: occasionally/socially    . Drug use: No  . Sexual activity: No  Other Topics Concern  . 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 Never used smokeless tobacco.   No e-cigarettes   Occasional alcohol consumption socially.   Single, no children.   Family History  Problem Relation Age of Onset  . Cancer Father       VITAL SIGNS Ht 6' 2"  (1.88 m)   Wt 144 lb 3.2 oz (65.4 kg)   BMI 18.51 kg/m     Medication List        Accurate as of 10/09/17 12:03 PM. Always use your most recent med list.          apixaban 5 MG Tabs tablet Commonly known as:  ELIQUIS Take 2 tabs po BID x7 days, then take 1 tab po BID   calcium elemental as carbonate 400 MG chewable tablet Commonly known as:  BARIATRIC TUMS ULTRA Chew 3 tablets (1,200 mg total) 3 (three) times daily by mouth.   cefpodoxime 200 MG tablet Commonly known as:  VANTIN Take 1 tablet (200 mg total) every 12 (twelve) hours for 5 days by mouth.   chlorhexidine 0.12 % solution Commonly known as:  PERIDEX 15 mLs by Mouth Rinse route  2 (two) times daily.   Cholecalciferol 50000 units Tabs Take 50,000 Units once a week by mouth. for 8 weeks, then return for level recheck   dronabinol 5 MG capsule Commonly known as:  MARINOL Take 1 capsule (5 mg total) daily before lunch by mouth.   Lidocaine-Prilocaine (Bulk) 2.5-2.5 % Crea   LORazepam 0.5 MG tablet Commonly known as:  ATIVAN Take 1 tablet (0.5 mg total) every 6 (six) hours as needed for up to 14 days by mouth (Nausea or vomiting).   magnesium oxide 400 (241.3 Mg) MG tablet Commonly known as:  MAG-OX Take 1 tablet (400 mg total) by mouth 2 (two) times daily.   multivitamin with minerals Tabs tablet Take 1 tablet by mouth daily.   OLANZapine zydis 5 MG disintegrating tablet Commonly known as:  ZYPREXA Take 0.5 tablets (2.5 mg total) by mouth at bedtime.   * oxyCODONE 10 mg 12 hr tablet Commonly known as:   OXYCONTIN Take 1 tablet (10 mg total) every 12 (twelve) hours by mouth.   * Oxycodone HCl 10 MG Tabs Take 1 tablet (10 mg total) every 4 (four) hours as needed by mouth (for moderate to severe pain).   pantoprazole 40 MG tablet Commonly known as:  PROTONIX   polyethylene glycol packet Commonly known as:  MIRALAX / GLYCOLAX   potassium & sodium phosphates 280-160-250 MG Pack Commonly known as:  PHOS-NAK Take 1 packet by mouth 4 (four) times daily -  before meals and at bedtime.   predniSONE 10 MG tablet Commonly known as:  DELTASONE Take 2 tablets (20 mg total) daily with breakfast by mouth.   prochlorperazine 10 MG tablet Commonly known as:  COMPAZINE   senna-docusate 8.6-50 MG tablet Commonly known as:  Senokot-S   sulfamethoxazole-trimethoprim 200-40 MG/5ML suspension Commonly known as:  BACTRIM,SEPTRA      * This list has 2 medication(s) that are the same as other medications prescribed for you. Read the directions carefully, and ask your doctor or other care provider to review them with you.           SIGNIFICANT DIAGNOSTIC EXAMS   PREVIOUS:   08-21-17:chest x-ray: No active cardiopulmonary disease.  08-21-17: lumbar spine complete x-ray: There is again noted fracture L3 vertebral body. Tumor extends into and involves the left L3 pedicle. This finding was present on prior lumbar MR. No new bone lesions are evident. No new fracture. No spondylolisthesis. There is facet osteoarthritic change at L4-5 L5-S1 bilaterally. There is also loss of the pedicle on the right at T12, indicative of bony metastatic disease in this area.  08-21-17: thoracic spine x-ray: No fracture or spondylolisthesis. There are multiple metastatic foci Seen in the thoracic spine performed three months prior. By radiography, there is loss of the pedicle of T12 on the right and subtle loss of the lateral aspect of the pedicle of T8 on the left. These findings are felt to be indicative of bony  metastatic disease. No other neoplastic foci identified by radiography the bony structures. Note that there are multiple foci of tumor and thoracic vertebral bodies seen on recent MR not seen by radiography currently.   08-22-17: placement of right nephrostomy placement  TODAY:   10-03-17: chest x-ray: No active cardiopulmonary disease.   10-03-17: ct angio of chest:  1. Acute pulmonary embolism within the LEFT lower lobe and lingula. These small emboli are nonocclusive. Overall clot burden is minimal. 2. No evidence of RIGHT ventricular strain. 3. Patchy airspace disease in the  LEFT lower lobe. Favor pulmonary infection such as pneumonia or aspiration rather than pulmonary infarction. 4. Frothy material within the LEFT lower lobe bronchus could represent aspirate from tracheal aspiration.  10-04-17 right nephrostomy stent replacement   LABS REVIEWED:   PREVIOUS:   08-21-17: wbc 0.1; hgb 9.8; hct 27.6; mcv 71.7; plt 76; glucose 144 bun 12; creat 0.63; k+ 3.5; na++ 135; ca 6.9; liver normal albumin 3.1; phos 1.9; BNP 607.1; uric acid 3.8 blood culture: enterobacteriaceae; e-coli 08-24-17: wbc 5.3; hgb 8.7; hct 249; mcv 73.7; plt 57; glucose 93; bun 11; creat 0.38; k+ 2.7; na++ 139; ca 6.3; albumin 2.3 08-25-17: blood culture: no growth  08-27-17: wbc 13.4; hgb 8.7; hct 25.2; mcv 73.5; plt 63; glucose 92; bun 7; creat 0.31; k+ 3.0; na++ 140; ca 6.3; alt 73; albumin 2.3  08-30-17: wbc 6.2; hgb 9.0; hct 26.5; mcv 74.9; plt 120; glucose 109; bun <5' creat <0.30; k+ 3.4; na++ 135; ca 6.2; liver normal albumin 2.3; mag 1.6; phos 1.7 09-03-17: wbc 6.3; hgb 10.0; hct 29.5; mcv 74.1; plt 403; glucose 103; bun 8; creat 0.38; k+ 3.1; na++ 136; mag 1.7; phos 1.9  09-17-17: wbc 10.4; hgb 11.6; hct 35.4; mcv 75.0; plt 453; glucose 108; bun 7.3; creat 0.5; k+ 3.5; na++ 135; ca 6.4; total bili 1.26; albumin 2.4; uric acid 6.8; mag 1.6; phos 1.9 09-21-17: wbc 30.8; hgb 8.4; hct 26.0; mcv 78.3; plt 252; uric acid  1.9; mag 1.7; phos 1.6 09-24-17: wbc 13.7; hgb 8.0; hct 26.1; mcv 78.4; plt 145; glucose 87; bun <5; creat 0.30; k+ 4.3; na+= 137; ca 6.5; ast 103; alt 76; alk phos 195; albumin 2.2; uric acid 1.6; mag 1.7; phos 2.3; vit D 8.3  TODAY:   10-03-17: wbc 3.4; hgb 9.2; hct 29.9; mcv 80.8; plt 415; glucose 97; bun 10; creat 0.45; k+ 3.0; na++ 139; ca 6.9; indirect bili 1.3; alk phos 158 albumin 2.8; mag 2.0; phos 1.6; blood culture: no growth; urine culture: 50,000 e-coli 10-05-17: wbc 2.9; hgb 7.8; hct 24.6; mcv 80.7 ;plt 329; glucose 92; bun 9; creat 0.36; k+ 3.0; na++ 140; ca 6.4; mag 1.7; phos 1.8; ionized calcium 3.5 10-08-17: wbc 3.2; hgb 9.0; hct 28.6; mcv 81.7; plt 318; glucose 99; bun <5; creat 0.31; k+ 3.5; na++ 140; ca 6.9; liver normal albumin 2.3; mag 1.6; phos 2.4    Review of Systems  Constitutional: Negative for malaise/fatigue.  Respiratory: Negative for cough and shortness of breath.   Cardiovascular: Negative for chest pain, palpitations and leg swelling.  Gastrointestinal: Negative for abdominal pain, constipation and heartburn.  Musculoskeletal: Negative for back pain, joint pain and myalgias.  Skin: Negative.   Neurological: Positive for weakness. Negative for dizziness.  Psychiatric/Behavioral: The patient is not nervous/anxious.    Physical Exam  Constitutional: He is oriented to person, place, and time. No distress.  Frail   Neck: Neck supple. No thyromegaly present.  Cardiovascular: Normal rate, regular rhythm and intact distal pulses.  Murmur heard. 1/6  Pulmonary/Chest: Effort normal and breath sounds normal. No respiratory distress.  Abdominal: Soft. He exhibits no distension. There is no tenderness.  Genitourinary:  Genitourinary Comments: Right nephrostomy stent   Musculoskeletal: Normal range of motion. He exhibits no edema.  Neurological: He is alert and oriented to person, place, and time.  Skin: Skin is warm and dry. He is not diaphoretic.  Right chest  portacath   Psychiatric: He has a normal mood and affect.   ASSESSMENT/ PLAN:   TODAY:  1. 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 Was diagnosed in July 2018. He has had a chemo of reduced [name] called mini-CHOP (october 2018) he did receive intrathecal methotrexate. The goal is to increase the dose back to normal levels. His weight is 144 pounds is taking marinol 5  Mg prior to lunch. For his pain management is taking oxycontin 10 mg twice daily and oxycodone 5 mg every 4 hours as needed will monitor   2. GERD: stable will continue protonix 40 mg daily   3. Electrolyte imbalance: including k+; mag; ca++; phosphate:  stable will continue phos nak 280-160-250 four times daily mag ox 400 mg twice daily and calcium carbonate 1200 mg three times daily   4.  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  5. Closed compression fracture of L3 lumbar vertebra: without change will continue therapy as directed has oxycodone 5 mg every 4 hours as needed is taking oxycontin 10 mg twice daily   6. Obstructing right ureteral stone: stable is status post right percutaneous nephrostomy exchange; will monitor   7. Chemo induced nausea and vomiting: without change will continue compazine 10 mg every 6 hours as needed and zofran 8 mg every 8 hours as needed   8. Adrenal insuffiencey: stable will continue prednisone 20 mg daily is on bractrim 3 days weekly long term.   9. Acute pulmonary embolism: stable will continue eliquis 10 mg twice daily for 7 days then 5 mg twice daily will monitor  10. Left lower lobe pneumonia: will complete vantin and will monitor  Will setup a palliative care consult.        MD is aware of resident's narcotic use and is in agreement with current plan of care. We will attempt to wean resident  as apropriate   Ok Edwards NP [facility]  Contact [phone] Monday through Friday 8am- 5pm  After hours call [phone]

## 2017-10-11 ENCOUNTER — Other Ambulatory Visit: Payer: Self-pay

## 2017-10-11 ENCOUNTER — Encounter: Payer: Self-pay | Admitting: Hematology and Oncology

## 2017-10-11 ENCOUNTER — Non-Acute Institutional Stay (SKILLED_NURSING_FACILITY): Payer: Medicaid Other | Admitting: Internal Medicine

## 2017-10-11 ENCOUNTER — Encounter: Payer: Self-pay | Admitting: Internal Medicine

## 2017-10-11 DIAGNOSIS — I2699 Other pulmonary embolism without acute cor pulmonale: Secondary | ICD-10-CM | POA: Diagnosis not present

## 2017-10-11 DIAGNOSIS — C7951 Secondary malignant neoplasm of bone: Secondary | ICD-10-CM

## 2017-10-11 DIAGNOSIS — C8338 Diffuse large B-cell lymphoma, lymph nodes of multiple sites: Secondary | ICD-10-CM

## 2017-10-11 DIAGNOSIS — E878 Other disorders of electrolyte and fluid balance, not elsewhere classified: Secondary | ICD-10-CM | POA: Diagnosis not present

## 2017-10-11 DIAGNOSIS — K5903 Drug induced constipation: Secondary | ICD-10-CM

## 2017-10-11 DIAGNOSIS — Z936 Other artificial openings of urinary tract status: Secondary | ICD-10-CM | POA: Diagnosis not present

## 2017-10-11 DIAGNOSIS — E43 Unspecified severe protein-calorie malnutrition: Secondary | ICD-10-CM | POA: Diagnosis not present

## 2017-10-11 DIAGNOSIS — F4323 Adjustment disorder with mixed anxiety and depressed mood: Secondary | ICD-10-CM

## 2017-10-11 MED ORDER — DRONABINOL 5 MG PO CAPS: 5.0000 mg | ORAL_CAPSULE | Freq: Every day | ORAL | 0 refills | Status: DC

## 2017-10-11 MED ORDER — OXYCODONE HCL 5 MG PO TABS: 5.0000 mg | ORAL_TABLET | ORAL | 0 refills | Status: DC | PRN

## 2017-10-11 MED ORDER — LORAZEPAM 0.5 MG PO TABS: 0.5000 mg | ORAL_TABLET | Freq: Four times a day (QID) | ORAL | 0 refills | Status: DC | PRN

## 2017-10-11 MED ORDER — OXYCODONE HCL ER 10 MG PO T12A: 10.0000 mg | EXTENDED_RELEASE_TABLET | Freq: Two times a day (BID) | ORAL | 0 refills | Status: DC

## 2017-10-11 NOTE — Progress Notes (Signed)
[facility] Follow-up Visit:  Assessment: Diffuse large B cell lymphoma ([place]) 56 year old male with diagnosis of diffuse large B-cell lymphoma, GCB subtype involving multifocal skeletal sites and multiple node regions. Initial presentation with severe left hip and knee due to extensive disease involving the skeletal structures of the spine with multilevel compression fractures. Some spinal canal intrusion without obvious cord compression was noted. Additionally, bulky retroperitoneal lymphadenopathy and a soft tissue mass in the thoracic wall were detected. Based on presence of extensive skeletal disease and soft tissue mass in the chest wall, patient is Stage IVB.  Patient was initially treated with steroids and palliative radiation to the spine which resulted in significant symptomatic improvement. Patient has recovered from his radiation therapy. Pretreatment workup included echocardiogram obtained on 07/19/17 demonstrating left ventricular ejection fraction of 50-55%, patient underwent lab work testing which returned back positive for core antibodies for hepatitis B virus. Additional testing was negative for IgM antibodies and negative for hepatitis B DNA. These findings are consistent with either previous infection for which the patient has completely recovered, or previous history of immunization patient does not recall having either.   Following initiation of systemic chemotherapy, patient was admitted to the hospital with septic shock due to urinary tract infection in the setting off obstruction of the renal drainage by kidney stone. Patient responded to stress dose steroids, pressor support, broad-spectrum antibiotics, and volume resuscitation. Has been able to maintain his own pressures after several days and demonstrated resumed adrenal function. We have retested here for hepatitis B and did not detect any signs of infection reactivation.  Nevertheless, patient demonstrated  rapid and profound loss of appetite resulting in severe calorie/protein malnutrition, dramatic decrease in his activity tolerance going from ECOG 2 down to ECOG 4. Renegotiation of chemotherapy was delayed to allow for improvement in the physical state.  At the last visit to the clinic, patient was admitted to the hospital to undergo the second cycle of systemic chemotherapy due to multiple electrolyte abnormalities.  He was aggressively replaced with electrolytes, and received his systemic treatment with significant dose reductions consistent with mini-RCHOP regimen.  Agent also received intrathecal methotrexate injection for prophylaxis of CNS recurrence.  Plan: --Continue electrolyte replacement by mouth and IV if needed --Return to clinic in 1 week for continued electrolyte monitoring, IV fluid support, and possible consideration for the third cycle of systemic chemotherapy. --Patient will need restaging PET/CT prior to the next visit in the clinic to assess disease response to treatment.   Voice recognition software was used and creation of this note. Despite my best effort at editing the text, some misspelling/errors may have occurred.  Orders Placed This Encounter  Procedures  . NM PET Image Initial (PI) Skull Base To Thigh    Standing Status:   Future    Standing Expiration Date:   10/02/2018    Order Specific Question:   If indicated for the ordered procedure, I authorize the administration of a radiopharmaceutical per Radiology protocol    Answer:   Yes    Order Specific Question:   Preferred imaging location?    Answer:   [facility]    Order Specific Question:   Radiology Contrast Protocol - do NOT remove file path    Answer:   [email or web address]    Order Specific Question:   Reason for Exam additional comments    Answer:   DLBCL  . CBC with Differential    Standing Status:   Future  Standing Expiration Date:   10/01/2018  . Comprehensive  metabolic panel    Standing Status:   Future    Standing Expiration Date:   10/01/2018  . Magnesium    Standing Status:   Future    Standing Expiration Date:   10/01/2018  . Phosphorus    Standing Status:   Future    Standing Expiration Date:   10/01/2018  . Calcium    Standing Status:   Future    Standing Expiration Date:   10/01/2018    Cancer Staging Diffuse large B cell lymphoma ([place]) Staging form: Hodgkin and Non-Hodgkin Lymphoma, AJCC 8th Edition - Clinical stage from 07/16/2017: Stage IV (Diffuse large B-cell lymphoma) - Signed by Ardath Sax, MD on 07/18/2017   All questions were answered.  . The patient knows to call the clinic with any problems, questions or concerns.  This note was electronically signed.    History of Presenting Illness Cory Ibarra is a 56 y.o. Caucasian male followed in the [facility] for diagnosis of diffuse large B-cell lymphoma. Patient initially presented on 06/28/17 with progressive pain in the left hip and knee at least 6 week duration. This was accompanied by decreased appetite, decreased activity due to the pain. Patient was not closely followed by physician for an extended period of time due to lack of insurance.  Initially presented the week prior and was diagnosed with sciatica and treated with steroids. Steroids resulted in symptomatic improvement, but the pain recurred rapidly after completion of a taper. Approximately at the time of onset of the pain in the left lower extremity, patient will start noticing mild abdominal discomfort. This has been associated with loss of appetite for a month and some weight loss. You, dysphasia, early satiety, diarrhea, constipation, melena, or hematochezia. Initial imaging evaluation revealed multifocal lytic lesions in the skeletal structures as well as pelvic mass. Oncology was consulted and patient was seen by Dr Marjie Skiff. Based on his recommendations, additional imaging was obtained, patient was started  on steroids, neurosurgery and radiation oncology were consulted. Patient was started on palliative radiation therapy to the lumbar spine which resulted in significant symptomatic improvement.  Patient presents to the clinic today for continued toxicity monitoring after receiving the second cycle of systemic chemotherapy.  Patient denies any new complaints since the last visit to the clinic.  Reports stable activity level and appetite.  No significant nausea.  No enteral fever or chills.   Oncological/hematological History:   Diffuse large B cell lymphoma ([place])   06/28/2017 Imaging    CT Renal protocol: Bilateral nephrolithiasis. 6.7 cm soft tissue mass in the left. Aortic location, additional 3.6 cm left. Aortic soft tissue mass. Mild prostatic enlargement. 5.6 cm soft tissue mass between the anterior right sixth and seventh ribs. Patchy lucencies in the T10, soft tissue mass in the right posterior elements of T12 measuring 3.9 cm with associated osseous destruction. There is mass extension into the spinal canal. Small lucent lesion in the posterior L1. Patchy lucency throughout L3 with partial collapse of the vertebral body. Soft tissue mass extending into the canal. Bilateral pubic symphysis lesions.       06/28/2017 Imaging    MRI T/L Spine: Multiple osseous lesions involving T2, T6, T7 with the near complete involvement of the vertebral body, T8, T10 with almost complete replacement, T12. Thoracic spinal cord with normal signal. Large L3 vertebral body lesion with burst fracture with 50% height loss, small lesion in L4, left prevertebral mass at the  L1 level measuring 3.9 cm, large periaortic mass at the level of left renal vessels measuring 9.4 cm.       06/29/2017 Initial Diagnosis    Diffuse large B cell lymphoma ([place])      06/29/2017 Pathology Results    Core Bx Rt chest wall: Diffuse large B-cell lymphoma, germinal center subtype; IHC -- positive for CD10, CD20, BCL-6, BCL-2, Ki-67 86%       07/02/2017 - 07/13/2017 Radiation Therapy    Site/dose:   Lumbar spine // 25 Gy in 10 Fx      07/19/2017 Echocardiogram    LVEF 50-55%      07/30/2017 Imaging    CT C/A/P: Interval improvement in some, but worsening of other skeletal lesions. No enlarged mediastinal lymph nodes. Normal appearing esophagus and thyroid gland.  No lung nodules or pleural fluid. Left anterior chest wall mass arising from the anterior aspect of the left third rib, 4.9x3.4cm. Right anterior chest wall mass arising from the anterior aspect of the sixth rib, 5.9x3.0cm, previously 5.6 x 3.9 cm on 06/28/2017. Lytic and expansile lesion in the lateral aspect of the left third rib. Similar changes involving the posterior aspect of the left seventh rib.Expansile lesion involving the posteromedial aspect of the left scapula. Lytic lesion in the right T10 vertebral body and posterior elements demonstrates some interval calcification without significant change in extent and with interval superior endplate compression. Lytic, expansile lesion involving the right vertebral body and posterior elements at the T12 level is smaller without well-visualized tumor in the canal on the right, improved. There is interval mild collapse of the posterior aspect of the vertebral body on the right. Normal appearing liver. Normal spleen in size without focal abnormality. The previously demonstrated 6.7 x 5.1 cm left para-aortic retroperitoneal lymph node mass is much smaller, currently measuring 3.1 x 1.7 cm on image number 81 series 2. A more inferior left para-aortic node previously measuring 3.6 x 3.3 cm currently measures 1.8 x 1.2 cm on image number 89 series 2. No new or enlarging lymph nodes are seen. The previously demonstrated left L3 vertebral destructive mass is significantly smaller with decreased size of the previously demonstrated component within the spinal canal. There is also mildly progressive collapse and sclerosis of the L3  vertebral body. There has been an increase in size of a similar-appearing mass in anterior aspect of the right iliac bone, currently measuring 4.0 x 3.4 cm. There is also been increase in size of a similar-appearing mass in the right pubic body, currently measuring approximately 3.1 x 2.8 cm, extending into the symphysis pubis.      08/09/2017 -  Chemotherapy    R-CHOP21: DOXOrubicin (ADRIAMYCIN) 50 mg/m2, d1 + vinCRIStine (ONCOVIN) 2 mg, d1 + riTUXimab (RITUXAN) 375 mg/m2, d1 + cyclophosphamide (CYTOXAN) 750 mg/m2, d1 Q21d x6 cycles planned --Cycle #1, 03/47/42:  Complicated by septic shock caused by obstructive urinary tract infection in the setting of neutropenia. Results in severe deconditioning, precipitous drop over the performance status of the patient.  --Cycle #2, 09/17/17: DR to mini-R-CHOP basic tolerance of the previous cycle. Intrathecal prophylactic methotrexate added to the therapy.       Medical History: Past Medical History:  Diagnosis Date  . Cancer ([place])   . Tendonitis     Surgical History: Past Surgical History:  Procedure Laterality Date  . HERNIA REPAIR  1988   Dr. Vida Rigger  . IR FLUORO GUIDE PORT INSERTION RIGHT  07/26/2017  . IR NEPHROSTOMY EXCHANGE RIGHT  10/04/2017  . IR NEPHROSTOMY PLACEMENT RIGHT  08/21/2017  . IR US GUIDE VASC ACCESS RIGHT  07/26/2017    Family History: Family History  Problem Relation Age of Onset  . Cancer Father     Social History: Social History   Socioeconomic History  . Marital status: Single    Spouse name: Not on file  . Number of children: 0  . Years of education: Not on file  . Highest education level: Not on file  Social Needs  . Financial resource strain: Not on file  . Food insecurity - worry: Not on file  . Food insecurity - inability: Not on file  . Transportation needs - medical: Not on file  . Transportation needs - non-medical: Not on file  Occupational History  . Not on file  Tobacco Use  . Smoking status:  Never Smoker  . Smokeless tobacco: Never Used  Substance and Sexual Activity  . Alcohol use: Yes    Comment: occasionally/socially  . Drug use: No  . Sexual activity: No  Other Topics Concern  . 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 Never used smokeless tobacco.   No e-cigarettes   Occasional alcohol consumption socially.   Single, no children.    Allergies: Allergies  Allergen Reactions  . Bee Venom Swelling    Medications:  Current Outpatient Medications  Medication Sig Dispense Refill  . apixaban (ELIQUIS) 5 MG TABS tablet Take 2 tabs po BID x7 days, then take 1 tab po BID 90 tablet 0  . calcium elemental as carbonate (BARIATRIC TUMS ULTRA) 400 MG chewable tablet Chew 3 tablets (1,200 mg total) 3 (three) times daily by mouth.    . cefpodoxime (VANTIN) 200 MG tablet Take 1 tablet (200 mg total) every 12 (twelve) hours for 5 days by mouth. 10 tablet 0  . chlorhexidine (PERIDEX) 0.12 % solution 15 mLs by Mouth Rinse route 2 (two) times daily. 120 mL 0  . Cholecalciferol 50000 units TABS Take 50,000 Units once a week by mouth. for 8 weeks, then return for level recheck 8 tablet 0  . dronabinol (MARINOL) 5 MG capsule Take 1 capsule (5 mg total) daily before lunch by mouth. 30 capsule 0  . Lidocaine-Prilocaine, Bulk, 2.5-2.5 % CREA Apply to affected areas topically one time daily    . LORazepam (ATIVAN) 0.5 MG tablet Take 1 tablet (0.5 mg total) every 6 (six) hours as needed for up to 14 days by mouth (Nausea or vomiting). 60 tablet 0  . magnesium oxide (MAG-OX) 400 (241.3 Mg) MG tablet Take 1 tablet (400 mg total) by mouth 2 (two) times daily. 60 tablet 0  . Multiple Vitamin (MULTIVITAMIN WITH MINERALS) TABS tablet Take 1 tablet by mouth daily.    . Nutritional Supplements (NUTRITIONAL SUPPLEMENT PO) Give Nutritional Treats with meals    . OLANZapine zydis (ZYPREXA) 5 MG disintegrating tablet Take 0.5 tablets  (2.5 mg total) by mouth at bedtime. 30 tablet 0  . oxyCODONE (OXYCONTIN) 10 mg 12 hr tablet Take 1 tablet (10 mg total) every 12 (twelve) hours by mouth. 60 tablet 0  . oxyCODONE (ROXICODONE) 5 MG immediate release tablet Take 1 tablet (5 mg total) every 4 (four) hours as needed by mouth for severe pain. 100 tablet 0  . pantoprazole (PROTONIX) 40 MG tablet Take 40 mg by mouth daily with lunch.     . polyethylene glycol (MIRALAX / GLYCOLAX) packet  Take 17 g daily as needed by mouth.    . potassium & sodium phosphates (PHOS-NAK) 280-160-250 MG PACK Take 1 packet by mouth 4 (four) times daily -  before meals and at bedtime. 120 packet 0  . predniSONE (DELTASONE) 10 MG tablet Take 2 tablets (20 mg total) daily with breakfast by mouth. 60 tablet 0  . prochlorperazine (COMPAZINE) 10 MG tablet Take 10 mg every 6 (six) hours as needed by mouth for nausea or vomiting.    . senna-docusate (SENOKOT-S) 8.6-50 MG tablet Take 1 tablet by mouth daily as needed for mild constipation.    . sulfamethoxazole-trimethoprim (BACTRIM,SEPTRA) 200-40 MG/5ML suspension Take 20 mLs by mouth every Monday, Wednesday, and Friday. Continuous     No current facility-administered medications for this visit.    Facility-Administered Medications Ordered in Other Visits  Medication Dose Route Frequency Provider Last Rate Last Dose  . sodium chloride flush (NS) 0.9 % injection 10 mL  10 mL Intracatheter PRN Ardath Sax, MD   10 mL at 08/09/17 1702    Review of Systems: Review of Systems  Constitutional: Positive for appetite change and fatigue. Negative for chills, diaphoresis, fever and unexpected weight change.  HENT:  Negative.   Eyes: Negative.   Respiratory: Negative.   Cardiovascular: Negative.   Gastrointestinal: Negative.   Endocrine: Negative.   Genitourinary: Negative.    Musculoskeletal: Positive for back pain. Negative for arthralgias, flank pain, myalgias, neck pain and neck stiffness.  Skin: Negative.    Neurological: Negative.   Hematological: Negative.   Psychiatric/Behavioral: Negative.      PHYSICAL EXAMINATION Blood pressure 113/62, pulse (!) 125, temperature 98.2 F (36.8 C), temperature source Oral, resp. rate 17, height _0  (1.88 m), SpO2 100 %.  ECOG PERFORMANCE STATUS: 3 - Symptomatic, >50% confined to bed  Physical Exam  Constitutional: He is oriented to person, place, and time. No distress.  HENT:  Head: Normocephalic and atraumatic.  Mouth/Throat: Oropharynx is clear and moist. No oropharyngeal exudate.  Eyes: Pupils are equal, round, and reactive to light. Right eye exhibits no discharge. No scleral icterus.  Neck: Normal range of motion. No thyromegaly present.  Cardiovascular: Normal rate and regular rhythm. Exam reveals no gallop.  No murmur heard. Pulmonary/Chest: Effort normal. No respiratory distress. He has no wheezes. He exhibits no tenderness.  Abdominal: Soft. He exhibits no distension and no mass. There is no tenderness. There is no rebound and no guarding.  Musculoskeletal: He exhibits no edema, tenderness or deformity.  Lymphadenopathy:    He has no cervical adenopathy.  Neurological: He is alert and oriented to person, place, and time. He has normal reflexes.  Skin: Skin is warm and dry. No rash noted. He is not diaphoretic. No erythema. No pallor.     LABORATORY DATA: I have personally reviewed the data as listed: Appointment on 10/01/2017  Component Date Value Ref Range Status  . WBC 10/01/2017 1.3* 4.0 - 10.3 10e3/uL Final  . NEUT# 10/01/2017 0.7* 1.5 - 6.5 10e3/uL Final  . HGB 10/01/2017 9.9* 13.0 - 17.1 g/dL Final  . HCT 10/01/2017 30.7* 38.4 - 49.9 % Final  . Platelets 10/01/2017 366  140 - 400 10e3/uL Final  . MCV 10/01/2017 77.5* 79.3 - 98.0 fL Final  . MCH 10/01/2017 25.0* 27.2 - 33.4 pg Final  . MCHC 10/01/2017 32.3  32.0 - 36.0 g/dL Final  . RBC 10/01/2017 3.96* 4.20 - 5.82 10e6/uL Final  . RDW 10/01/2017 22.7* 11.0 - 14.6 % Final   .  lymph# 10/01/2017 0.1* 0.9 - 3.3 10e3/uL Final  . MONO# 10/01/2017 0.5  0.1 - 0.9 10e3/uL Final  . Eosinophils Absolute 10/01/2017 0.0  0.0 - 0.5 10e3/uL Final  . Basophils Absolute 10/01/2017 0.0  0.0 - 0.1 10e3/uL Final  . NEUT% 10/01/2017 54.9  39.0 - 75.0 % Final  . LYMPH% 10/01/2017 5.3* 14.0 - 49.0 % Final  . MONO% 10/01/2017 39.1* 0.0 - 14.0 % Final  . EOS% 10/01/2017 0.2  0.0 - 7.0 % Final  . BASO% 10/01/2017 0.5  0.0 - 2.0 % Final  . Sodium 10/01/2017 138  136 - 145 mEq/L Final  . Potassium 10/01/2017 3.8  3.5 - 5.1 mEq/L Final  . Chloride 10/01/2017 102  98 - 109 mEq/L Final  . CO2 10/01/2017 21* 22 - 29 mEq/L Final  . Glucose 10/01/2017 104  70 - 140 mg/dl Final   Glucose reference range is for nonfasting patients. Fasting glucose reference range is 70- 100.  Marland Kitchen BUN 10/01/2017 10.9  7.0 - 26.0 mg/dL Final  . Creatinine 10/01/2017 0.5* 0.7 - 1.3 mg/dL Final  . Total Bilirubin 10/01/2017 1.19  0.20 - 1.20 mg/dL Final  . Alkaline Phosphatase 10/01/2017 186* 40 - 150 U/L Final  . AST 10/01/2017 29  5 - 34 U/L Final  . ALT 10/01/2017 48  0 - 55 U/L Final  . Total Protein 10/01/2017 6.2* 6.4 - 8.3 g/dL Final  . Albumin 10/01/2017 2.6* 3.5 - 5.0 g/dL Final  . Calcium 10/01/2017 7.7* 8.4 - 10.4 mg/dL Final  . Anion Gap 10/01/2017 15* 3 - 11 mEq/L Final  . EGFR 10/01/2017 >60  >60 ml/min/1.73 m2 Final   eGFR is calculated using the CKD-EPI Creatinine Equation (2009)  . LDH 10/01/2017 162  125 - 245 U/L Final  . Magnesium 10/01/2017 2.0  1.5 - 2.5 mg/dl Final  . Phosphorus, Ser 10/01/2017 2.3* 2.5 - 4.5 mg/dL Final  Admission on 09/17/2017, Discharged on 09/25/2017  No results displayed because visit has over 200 results.         Ardath Sax, MD

## 2017-10-11 NOTE — Progress Notes (Signed)
Patient ID: Cory Ibarra, male   DOB: [birth year], 56 y.o.   MRN: [id number]     HISTORY AND PHYSICAL   DATE:  October 11, 2017  Location:   [place] Room Number: 458 A Place of Service: SNF (31)   Extended Emergency Contact Information Primary Emergency Contact: Buys,Michael Address: [address] [place], [place] [address] [name] of [place] Phone: [phone] Relation: Brother Secondary Emergency Contact: Constance Haw, [place] [address] [name] of [place] Phone: [phone] Work Phone: [phone] Mobile Phone: [phone] Relation: Brother  Advanced Directive information Does Patient Have a Medical Advance Directive?: Yes, Would patient like information on creating a medical advance directive?: No - Patient declined, Type of Advance Directive: Healthcare Power of [place], Does patient want to make changes to medical advance directive?: No - Patient declined  Chief Complaint  Patient presents with  . Readmit To SNF    Readmission    HPI:  56 yo male seen today as a readmission into SNF following hospital stay for acute PE, B cell lymphoma, LLL HCAP, obstructive uropathy, ureterolithiasis s/p right nephrostomy, electrolyte derangement, FTT, dehydration, acute respiratory failure with hypoxia, adrenal insufficiency, skin pressure injury. CTA chest showed LLL and lingula PE nonocclusive with minimum clot burden; no evidence of right heart strain; lytic and expansile lesions in posterior left ribs and left scapula c/w bone mets. He was tx with IV heparin gtt-->eliquis. His right nephrostomy tube was exchanged on 10/04/17. Albumin 2.3; Hgb dropped to 7.7-->9; K 3-->2.5-->3.5; Cr 0.316; WBC 3.2K; alk phos 158-->121; t bili 1.6-->0.6; Mg 1.6; Phos 1.6-->2.4; urine cx (+) pan sensitive E coli; blood cx NGTD at d/c. He presents to SNF for short term rehab.  Today he reports decreased appetite but no N/V. Pain controlled on current  regimen. No f/c. He is scheduled for chemotx inpt next week. He continues to have issues with constipation. No other concerns.  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 He has rec'd chemotx including [name] and xgeva.  His weight dropped from 168 lbs in Sept 2018 to 145 lbs in Oct 2018. Current weight 144 lbs. He takes marinol 5 mg prior to lunch; oxycontin ER 66m q12hr; oxycodone 10 mg every 4 hours for pain. He also takes compazine, prednisone and zofran. He is on eliquis now 2/2 new PE  GERD - stable on protonix 40 mg daily   Electrolyte disturbance - borderline controlled on K-phos 500 mg twice daily. K 3.5; Phos 2.4; Mg 1.6 on Mag Oxide. Corrected Ca 8.58 and he takes Ca supplement.   Adjustment disorder with mild anxiety and depressed mood - stable on zyprexa 2.5 mg nightly and ativan 0.5 mg every 6 hours as needed for the next 14 days  Past Medical History:  Diagnosis Date  . Cancer ([place]   . Tendonitis     Past Surgical History:  Procedure Laterality Date  . HERNIA REPAIR  1988   Dr. FVida Rigger . IR FLUORO GUIDE PORT INSERTION RIGHT  07/26/2017  . IR NEPHROSTOMY EXCHANGE RIGHT  10/04/2017  . IR NEPHROSTOMY PLACEMENT RIGHT  08/21/2017  . IR [place] VASC ACCESS RIGHT  07/26/2017    Patient Care Team: HScot Jun [name] as PCP - General (Family Medicine)  Social History   Socioeconomic History  . Marital  status: Single    Spouse name: Not on file  . Number of children: 0  . Years of education: Not on file  . Highest education level: Not on file  Social Needs  . Financial resource strain: Not on file  . Food insecurity - worry: Not on file  . Food insecurity - inability: Not on file  . Transportation needs - medical: Not on file  . Transportation needs - non-medical: Not on file  Occupational History  . Not on file  Tobacco Use  . Smoking status: Never Smoker  . Smokeless tobacco: Never Used    Substance and Sexual Activity  . Alcohol use: Yes    Comment: occasionally/socially  . Drug use: No  . Sexual activity: No  Other Topics Concern  . 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 Never used smokeless tobacco.   No e-cigarettes   Occasional alcohol consumption socially.   Single, no children.     reports that  has never smoked. he has never used smokeless tobacco. He reports that he drinks alcohol. He reports that he does not use drugs.  Family History  Problem Relation Age of Onset  . Cancer Father    Family Status  Relation Name Status  . Mother  Deceased  . Father  Deceased     There is no immunization history on file for this patient.  Allergies  Allergen Reactions  . Bee Venom Swelling    Medications:   Medication List        Accurate as of 10/11/17  9:13 AM. Always use your most recent med list.          apixaban 5 MG Tabs tablet Commonly known as:  ELIQUIS Take 2 tabs po BID x7 days, then take 1 tab po BID   calcium elemental as carbonate 400 MG chewable tablet Commonly known as:  BARIATRIC TUMS ULTRA Chew 3 tablets (1,200 mg total) 3 (three) times daily by mouth.   cefpodoxime 200 MG tablet Commonly known as:  VANTIN Take 1 tablet (200 mg total) every 12 (twelve) hours for 5 days by mouth.   chlorhexidine 0.12 % solution Commonly known as:  PERIDEX 15 mLs by Mouth Rinse route 2 (two) times daily.   Cholecalciferol 50000 units Tabs Take 50,000 Units once a week by mouth. for 8 weeks, then return for level recheck   dronabinol 5 MG capsule Commonly known as:  MARINOL Take 1 capsule (5 mg total) daily before lunch by mouth.   Lidocaine-Prilocaine (Bulk) 2.5-2.5 % Crea   LORazepam 0.5 MG tablet Commonly known as:  ATIVAN Take 1 tablet (0.5 mg total) every 6 (six) hours as needed for up to 14 days by mouth (Nausea or vomiting).   magnesium oxide 400 (241.3 Mg)  MG tablet Commonly known as:  MAG-OX Take 1 tablet (400 mg total) by mouth 2 (two) times daily.   multivitamin with minerals Tabs tablet Take 1 tablet by mouth daily.   NUTRITIONAL SUPPLEMENT PO   OLANZapine zydis 5 MG disintegrating tablet Commonly known as:  ZYPREXA Take 0.5 tablets (2.5 mg total) by mouth at bedtime.   * oxyCODONE 10 mg 12 hr tablet Commonly known as:  OXYCONTIN Take 1 tablet (10 mg total) every 12 (twelve) hours by mouth.   * Oxycodone HCl 10 MG Tabs Take 1 tablet (10 mg total) every 4 (four) hours as needed by mouth (for moderate  to severe pain).   pantoprazole 40 MG tablet Commonly known as:  PROTONIX   polyethylene glycol packet Commonly known as:  MIRALAX / GLYCOLAX   potassium & sodium phosphates 280-160-250 MG Pack Commonly known as:  PHOS-NAK Take 1 packet by mouth 4 (four) times daily -  before meals and at bedtime.   predniSONE 10 MG tablet Commonly known as:  DELTASONE Take 2 tablets (20 mg total) daily with breakfast by mouth.   prochlorperazine 10 MG tablet Commonly known as:  COMPAZINE   senna-docusate 8.6-50 MG tablet Commonly known as:  Senokot-S   sulfamethoxazole-trimethoprim 200-40 MG/5ML suspension Commonly known as:  BACTRIM,SEPTRA      * This list has 2 medication(s) that are the same as other medications prescribed for you. Read the directions carefully, and ask your doctor or other care provider to review them with you.          Review of Systems  Constitutional: Positive for appetite change (reduced).  Musculoskeletal: Positive for arthralgias and gait problem.  All other systems reviewed and are negative.   Vitals:   10/11/17 0858  BP: 102/60  Pulse: 88  Resp: 16  Temp: (!) 96.5 F (35.8 C)  SpO2: 98%  Weight: 144 lb 3.2 oz (65.4 kg)  Height: 6' 2"  (1.88 m)   Body mass index is 18.51 kg/m.  Physical Exam  Constitutional: He is oriented to person, place, and time. He appears well-developed.  Frail  appearing in NAD, sitting up in bed; [place] O2 intact  HENT:  Mouth/Throat: Oropharynx is clear and moist.  MMM; no oral thrush  Eyes: Pupils are equal, round, and reactive to light. No scleral icterus.  Neck: Neck supple. Carotid bruit is not present. No thyromegaly present.  Cardiovascular: Normal rate, regular rhythm and intact distal pulses. Exam reveals no gallop and no friction rub.  Murmur (1/6 SEM) heard. No distal LE edema. No calf TTP  Pulmonary/Chest: Effort normal and breath sounds normal. He has no wheezes. He has no rales. He exhibits no tenderness.  Left ACW port-a-cath palpable with no redness, TTP or d/c.  Abdominal: Soft. Normal appearance and bowel sounds are normal. He exhibits no distension, no abdominal bruit, no pulsatile midline mass and no mass. There is no hepatomegaly. There is no tenderness. There is no rigidity, no rebound and no guarding. No hernia.  Right nephrostomy drain tube intact and DTG  Genitourinary:  Genitourinary Comments: Foley DTG with clear yellow urine  Musculoskeletal: He exhibits no edema.  Lymphadenopathy:       Head (right side): No posterior auricular and no occipital adenopathy present.       Head (left side): No posterior auricular and no occipital adenopathy present.    He has no cervical adenopathy.       Right: No supraclavicular adenopathy present.       Left: No supraclavicular adenopathy present.  Neurological: He is alert and oriented to person, place, and time.  Skin: Skin is warm and dry. No rash noted.  Psychiatric: He has a normal mood and affect. His behavior is normal. Judgment and thought content normal.     Labs reviewed: Admission on 10/03/2017, Discharged on 10/08/2017  No results displayed because visit has over 200 results.    Appointment on 10/01/2017  Component Date Value Ref Range Status  . WBC 10/01/2017 1.3* 4.0 - 10.3 10e3/uL Final  . NEUT# 10/01/2017 0.7* 1.5 - 6.5 10e3/uL Final  . HGB 10/01/2017 9.9* 13.0 -  17.1 g/dL  Final  . HCT 10/01/2017 30.7* 38.4 - 49.9 % Final  . Platelets 10/01/2017 366  140 - 400 10e3/uL Final  . MCV 10/01/2017 77.5* 79.3 - 98.0 fL Final  . MCH 10/01/2017 25.0* 27.2 - 33.4 pg Final  . MCHC 10/01/2017 32.3  32.0 - 36.0 g/dL Final  . RBC 10/01/2017 3.96* 4.20 - 5.82 10e6/uL Final  . RDW 10/01/2017 22.7* 11.0 - 14.6 % Final  . lymph# 10/01/2017 0.1* 0.9 - 3.3 10e3/uL Final  . MONO# 10/01/2017 0.5  0.1 - 0.9 10e3/uL Final  . Eosinophils Absolute 10/01/2017 0.0  0.0 - 0.5 10e3/uL Final  . Basophils Absolute 10/01/2017 0.0  0.0 - 0.1 10e3/uL Final  . NEUT% 10/01/2017 54.9  39.0 - 75.0 % Final  . LYMPH% 10/01/2017 5.3* 14.0 - 49.0 % Final  . MONO% 10/01/2017 39.1* 0.0 - 14.0 % Final  . EOS% 10/01/2017 0.2  0.0 - 7.0 % Final  . BASO% 10/01/2017 0.5  0.0 - 2.0 % Final  . Sodium 10/01/2017 138  136 - 145 mEq/L Final  . Potassium 10/01/2017 3.8  3.5 - 5.1 mEq/L Final  . Chloride 10/01/2017 102  98 - 109 mEq/L Final  . CO2 10/01/2017 21* 22 - 29 mEq/L Final  . Glucose 10/01/2017 104  70 - 140 mg/dl Final   Glucose reference range is for nonfasting patients. Fasting glucose reference range is 70- 100.  Marland Kitchen BUN 10/01/2017 10.9  7.0 - 26.0 mg/dL Final  . Creatinine 10/01/2017 0.5* 0.7 - 1.3 mg/dL Final  . Total Bilirubin 10/01/2017 1.19  0.20 - 1.20 mg/dL Final  . Alkaline Phosphatase 10/01/2017 186* 40 - 150 U/L Final  . AST 10/01/2017 29  5 - 34 U/L Final  . ALT 10/01/2017 48  0 - 55 U/L Final  . Total Protein 10/01/2017 6.2* 6.4 - 8.3 g/dL Final  . Albumin 10/01/2017 2.6* 3.5 - 5.0 g/dL Final  . Calcium 10/01/2017 7.7* 8.4 - 10.4 mg/dL Final  . Anion Gap 10/01/2017 15* 3 - 11 mEq/L Final  . EGFR 10/01/2017 >60  >60 ml/min/1.73 m2 Final   eGFR is calculated using the CKD-EPI Creatinine Equation (2009)  . LDH 10/01/2017 162  125 - 245 U/L Final  . Magnesium 10/01/2017 2.0  1.5 - 2.5 mg/dl Final  . Phosphorus, Ser 10/01/2017 2.3* 2.5 - 4.5 mg/dL Final  Admission on  09/17/2017, Discharged on 09/25/2017  No results displayed because visit has over 200 results.    Appointment on 09/17/2017  Component Date Value Ref Range Status  . WBC 09/17/2017 10.4* 4.0 - 10.3 10e3/uL Final  . NEUT# 09/17/2017 7.9* 1.5 - 6.5 10e3/uL Final  . HGB 09/17/2017 11.6* 13.0 - 17.1 g/dL Final  . HCT 09/17/2017 35.4* 38.4 - 49.9 % Final  . Platelets 09/17/2017 453* 140 - 400 10e3/uL Final  . MCV 09/17/2017 75.0* 79.3 - 98.0 fL Final  . MCH 09/17/2017 24.6* 27.2 - 33.4 pg Final  . MCHC 09/17/2017 32.8  32.0 - 36.0 g/dL Final  . RBC 09/17/2017 4.72  4.20 - 5.82 10e6/uL Final  . RDW 09/17/2017 19.8* 11.0 - 14.6 % Final  . lymph# 09/17/2017 1.3  0.9 - 3.3 10e3/uL Final  . MONO# 09/17/2017 1.1* 0.1 - 0.9 10e3/uL Final  . Eosinophils Absolute 09/17/2017 0.1  0.0 - 0.5 10e3/uL Final  . Basophils Absolute 09/17/2017 0.0  0.0 - 0.1 10e3/uL Final  . NEUT% 09/17/2017 75.8* 39.0 - 75.0 % Final  . LYMPH% 09/17/2017 12.8* 14.0 - 49.0 %  Final  . MONO% 09/17/2017 10.5  0.0 - 14.0 % Final  . EOS% 09/17/2017 0.8  0.0 - 7.0 % Final  . BASO% 09/17/2017 0.1  0.0 - 2.0 % Final  . Sodium 09/17/2017 135* 136 - 145 mEq/L Final  . Potassium 09/17/2017 3.5  3.5 - 5.1 mEq/L Final  . Chloride 09/17/2017 98  98 - 109 mEq/L Final  . CO2 09/17/2017 19* 22 - 29 mEq/L Final  . Glucose 09/17/2017 108  70 - 140 mg/dl Final   Glucose reference range is for nonfasting patients. Fasting glucose reference range is 70- 100.  Marland Kitchen BUN 09/17/2017 7.3  7.0 - 26.0 mg/dL Final  . Creatinine 09/17/2017 0.5* 0.7 - 1.3 mg/dL Final  . Total Bilirubin 09/17/2017 1.26* 0.20 - 1.20 mg/dL Final  . Alkaline Phosphatase 09/17/2017 146  40 - 150 U/L Final  . AST 09/17/2017 15  5 - 34 U/L Final  . ALT 09/17/2017 <6  0-55 U/L U/L Final  . Total Protein 09/17/2017 6.3* 6.4 - 8.3 g/dL Final  . Albumin 09/17/2017 2.4* 3.5 - 5.0 g/dL Final  . Calcium 09/17/2017 6.4* 8.4 - 10.4 mg/dL Final  . Anion Gap 09/17/2017 18* 3 - 11 mEq/L  Final  . EGFR 09/17/2017 >60  >60 ml/min/1.73 m2 Final   eGFR is calculated using the CKD-EPI Creatinine Equation (2009)  . Uric Acid, Serum 09/17/2017 6.8  2.6 - 7.4 mg/dl Final  . Magnesium 09/17/2017 1.6  1.5 - 2.5 mg/dl Final  . Phosphorus, Ser 09/17/2017 1.9* 2.5 - 4.5 mg/dL Final  . Hep B Core Ab, IgM 09/17/2017 Negative  Negative Final  . HBV DNA SERPL PCR-ACNC 09/17/2017 HBV DNA not detected  IU/mL Final  . HBV DNA SERPL PCR-LOG IU 09/17/2017 Test Not Performed.  log10 IU/mL Final   Comment: Unable to calculate result since non-numeric result obtained for component test.   . Test Information: 09/17/2017 Comment   Final   The reportable range for this assay is 10 IU/mL to 1 billion IU/mL.  Appointment on 09/06/2017  Component Date Value Ref Range Status  . WBC 09/06/2017 7.4  4.0 - 10.3 10e3/uL Final  . NEUT# 09/06/2017 5.7  1.5 - 6.5 10e3/uL Final  . HGB 09/06/2017 10.4* 13.0 - 17.1 g/dL Final  . HCT 09/06/2017 30.8* 38.4 - 49.9 % Final  . Platelets 09/06/2017 543* 140 - 400 10e3/uL Final  . MCV 09/06/2017 72.8* 79.3 - 98.0 fL Final  . MCH 09/06/2017 24.6* 27.2 - 33.4 pg Final  . MCHC 09/06/2017 33.8  32.0 - 36.0 g/dL Final  . RBC 09/06/2017 4.23  4.20 - 5.82 10e6/uL Final  . RDW 09/06/2017 19.6* 11.0 - 14.6 % Final  . lymph# 09/06/2017 0.2* 0.9 - 3.3 10e3/uL Final  . MONO# 09/06/2017 1.3* 0.1 - 0.9 10e3/uL Final  . Eosinophils Absolute 09/06/2017 0.0  0.0 - 0.5 10e3/uL Final  . Basophils Absolute 09/06/2017 0.1  0.0 - 0.1 10e3/uL Final  . NEUT% 09/06/2017 77.8* 39.0 - 75.0 % Final  . LYMPH% 09/06/2017 2.9* 14.0 - 49.0 % Final  . MONO% 09/06/2017 17.2* 0.0 - 14.0 % Final  . EOS% 09/06/2017 0.1  0.0 - 7.0 % Final  . BASO% 09/06/2017 2.0  0.0 - 2.0 % Final  . Sodium 09/06/2017 136  136 - 145 mEq/L Final  . Potassium 09/06/2017 2.8* 3.5 - 5.1 mEq/L Final  . Chloride 09/06/2017 100  98 - 109 mEq/L Final  . CO2 09/06/2017 21* 22 - 29 mEq/L Final  .  Glucose 09/06/2017 99  70  - 140 mg/dl Final   Glucose reference range is for nonfasting patients. Fasting glucose reference range is 70- 100.  Marland Kitchen BUN 09/06/2017 11.1  7.0 - 26.0 mg/dL Final  . Creatinine 09/06/2017 0.5* 0.7 - 1.3 mg/dL Final  . Total Bilirubin 09/06/2017 1.14  0.20 - 1.20 mg/dL Final  . Alkaline Phosphatase 09/06/2017 141  40 - 150 U/L Final  . AST 09/06/2017 15  5 - 34 U/L Final  . ALT 09/06/2017 10  0 - 55 U/L Final  . Total Protein 09/06/2017 6.4  6.4 - 8.3 g/dL Final  . Albumin 09/06/2017 2.5* 3.5 - 5.0 g/dL Final  . Calcium 09/06/2017 7.1* 8.4 - 10.4 mg/dL Final  . Anion Gap 09/06/2017 15* 3 - 11 mEq/L Final  . EGFR 09/06/2017 >90  >90 ml/min/1.73 m2 Final   eGFR is calculated using the CKD-EPI Creatinine Equation (2009)  . LDH 09/06/2017 293* 125 - 245 U/L Final  . Uric Acid, Serum 09/06/2017 4.8  2.6 - 7.4 mg/dl Final  . Magnesium 09/06/2017 1.8  1.5 - 2.5 mg/dl Final  . Phosphorus, Ser 09/06/2017 1.4* 2.5 - 4.5 mg/dL Final   **Verified by repeat analysis**  . Hep B Core Ab, IgM 09/06/2017 Negative  Negative Final  . HBsAg Screen 09/06/2017 Negative  Negative Final  . Hep B E Ag 09/06/2017 Negative  Negative Final  Admission on 08/21/2017, Discharged on 09/03/2017  No results displayed because visit has over 200 results.    Appointment on 08/16/2017  Component Date Value Ref Range Status  . WBC 08/16/2017 2.7* 4.0 - 10.3 10e3/uL Final  . NEUT# 08/16/2017 2.6  1.5 - 6.5 10e3/uL Final  . HGB 08/16/2017 11.0* 13.0 - 17.1 g/dL Final  . HCT 08/16/2017 32.3* 38.4 - 49.9 % Final  . Platelets 08/16/2017 158  140 - 400 10e3/uL Final  . MCV 08/16/2017 73.9* 79.3 - 98.0 fL Final  . MCH 08/16/2017 25.1* 27.2 - 33.4 pg Final  . MCHC 08/16/2017 34.0  32.0 - 36.0 g/dL Final  . RBC 08/16/2017 4.37  4.20 - 5.82 10e6/uL Final  . RDW 08/16/2017 17.7* 11.0 - 14.6 % Final  . lymph# 08/16/2017 0.1* 0.9 - 3.3 10e3/uL Final  . MONO# 08/16/2017 0.0* 0.1 - 0.9 10e3/uL Final  . Eosinophils Absolute  08/16/2017 0.0  0.0 - 0.5 10e3/uL Final  . Basophils Absolute 08/16/2017 0.0  0.0 - 0.1 10e3/uL Final  . NEUT% 08/16/2017 93.9* 39.0 - 75.0 % Final  . LYMPH% 08/16/2017 3.8* 14.0 - 49.0 % Final  . MONO% 08/16/2017 0.5  0.0 - 14.0 % Final  . EOS% 08/16/2017 1.6  0.0 - 7.0 % Final  . BASO% 08/16/2017 0.2  0.0 - 2.0 % Final  . Sodium 08/16/2017 135* 136 - 145 mEq/L Final  . Potassium 08/16/2017 3.6  3.5 - 5.1 mEq/L Final  . Chloride 08/16/2017 101  98 - 109 mEq/L Final  . CO2 08/16/2017 25  22 - 29 mEq/L Final  . Glucose 08/16/2017 83  70 - 140 mg/dl Final   Glucose reference range is for nonfasting patients. Fasting glucose reference range is 70- 100.  Marland Kitchen BUN 08/16/2017 10.4  7.0 - 26.0 mg/dL Final  . Creatinine 08/16/2017 0.6* 0.7 - 1.3 mg/dL Final  . Total Bilirubin 08/16/2017 0.89  0.20 - 1.20 mg/dL Final  . Alkaline Phosphatase 08/16/2017 110  40 - 150 U/L Final  . AST 08/16/2017 27  5 - 34 U/L Final  .  ALT 08/16/2017 59* 0 - 55 U/L Final  . Total Protein 08/16/2017 6.4  6.4 - 8.3 g/dL Final  . Albumin 08/16/2017 3.4* 3.5 - 5.0 g/dL Final  . Calcium 08/16/2017 8.1* 8.4 - 10.4 mg/dL Final  . Anion Gap 08/16/2017 8  3 - 11 mEq/L Final  . EGFR 08/16/2017 >90  >90 ml/min/1.73 m2 Final   eGFR is calculated using the CKD-EPI Creatinine Equation (2009)  . Uric Acid, Serum 08/16/2017 <2.0* 2.6 - 7.4 mg/dl Final  . Phosphorus, Ser 08/16/2017 1.2* 2.5 - 4.5 mg/dL Final   **Verified by repeat analysis**  . Magnesium 08/16/2017 2.3  1.5 - 2.5 mg/dl Final  Appointment on 08/08/2017  Component Date Value Ref Range Status  . WBC 08/08/2017 4.9  4.0 - 10.3 10e3/uL Final  . NEUT# 08/08/2017 3.6  1.5 - 6.5 10e3/uL Final  . HGB 08/08/2017 12.2* 13.0 - 17.1 g/dL Final  . HCT 08/08/2017 36.6* 38.4 - 49.9 % Final  . Platelets 08/08/2017 255  140 - 400 10e3/uL Final  . MCV 08/08/2017 76.3* 79.3 - 98.0 fL Final  . MCH 08/08/2017 25.4* 27.2 - 33.4 pg Final  . MCHC 08/08/2017 33.3  32.0 - 36.0 g/dL  Final  . RBC 08/08/2017 4.80  4.20 - 5.82 10e6/uL Final  . RDW 08/08/2017 16.3* 11.0 - 14.6 % Final  . lymph# 08/08/2017 1.0  0.9 - 3.3 10e3/uL Final  . MONO# 08/08/2017 0.3  0.1 - 0.9 10e3/uL Final  . Eosinophils Absolute 08/08/2017 0.0  0.0 - 0.5 10e3/uL Final  . Basophils Absolute 08/08/2017 0.0  0.0 - 0.1 10e3/uL Final  . NEUT% 08/08/2017 73.0  39.0 - 75.0 % Final  . LYMPH% 08/08/2017 20.6  14.0 - 49.0 % Final  . MONO% 08/08/2017 6.0  0.0 - 14.0 % Final  . EOS% 08/08/2017 0.4  0.0 - 7.0 % Final  . BASO% 08/08/2017 0.0  0.0 - 2.0 % Final  . Sodium 08/08/2017 137  136 - 145 mEq/L Final  . Potassium 08/08/2017 3.5  3.5 - 5.1 mEq/L Final  . Chloride 08/08/2017 100  98 - 109 mEq/L Final  . CO2 08/08/2017 27  22 - 29 mEq/L Final  . Glucose 08/08/2017 101  70 - 140 mg/dl Final   Glucose reference range is for nonfasting patients. Fasting glucose reference range is 70- 100.  Marland Kitchen BUN 08/08/2017 12.3  7.0 - 26.0 mg/dL Final  . Creatinine 08/08/2017 0.8  0.7 - 1.3 mg/dL Final  . Total Bilirubin 08/08/2017 0.85  0.20 - 1.20 mg/dL Final  . Alkaline Phosphatase 08/08/2017 106  40 - 150 U/L Final  . AST 08/08/2017 22  5 - 34 U/L Final  . ALT 08/08/2017 22  0 - 55 U/L Final  . Total Protein 08/08/2017 6.9  6.4 - 8.3 g/dL Final  . Albumin 08/08/2017 3.4* 3.5 - 5.0 g/dL Final  . Calcium 08/08/2017 10.6* 8.4 - 10.4 mg/dL Final  . Anion Gap 08/08/2017 10  3 - 11 mEq/L Final  . EGFR 08/08/2017 >90  >90 ml/min/1.73 m2 Final   eGFR is calculated using the CKD-EPI Creatinine Equation (2009)  . Uric Acid, Serum 08/08/2017 3.7  2.6 - 7.4 mg/dl Final  . Magnesium 08/08/2017 2.1  1.5 - 2.5 mg/dl Final  . Phosphorus, Ser 08/08/2017 2.8  2.5 - 4.5 mg/dL Final  . Hep B E Ag 08/08/2017 Negative  Negative Final  . HBV DNA SERPL PCR-ACNC 08/08/2017 HBV DNA not detected  IU/mL Final  . HBV DNA  SERPL PCR-LOG IU 08/08/2017 Test Not Performed.  log10 IU/mL Final   Comment: Unable to calculate result since  non-numeric result obtained for component test.   . Test Information: 08/08/2017 Comment   Final   The reportable range for this assay is 10 IU/mL to 1 billion IU/mL.  Marland Kitchen Hep B Core Ab, IgM 08/08/2017 Negative  Negative Final  . LDH 08/08/2017 309* 125 - 245 U/L Final  . Technologist Review 08/08/2017 Rare meta   Final  Hospital Outpatient Visit on 07/26/2017  Component Date Value Ref Range Status  . aPTT 07/26/2017 29  24 - 36 seconds Final  . Sodium 07/26/2017 130* 135 - 145 mmol/L Final  . Potassium 07/26/2017 3.5  3.5 - 5.1 mmol/L Final  . Chloride 07/26/2017 94* 101 - 111 mmol/L Final  . CO2 07/26/2017 25  22 - 32 mmol/L Final  . Glucose, Bld 07/26/2017 92  65 - 99 mg/dL Final  . BUN 07/26/2017 18  6 - 20 mg/dL Final  . Creatinine, Ser 07/26/2017 0.68  0.61 - 1.24 mg/dL Final  . Calcium 07/26/2017 9.4  8.9 - 10.3 mg/dL Final  . GFR calc non Af Amer 07/26/2017 >60  >60 mL/min Final  . GFR calc Af Amer 07/26/2017 >60  >60 mL/min Final   Comment: (NOTE) The eGFR has been calculated using the CKD EPI equation. This calculation has not been validated in all clinical situations. eGFR's persistently <60 mL/min signify possible Chronic Kidney Disease.   . Anion gap 07/26/2017 11  5 - 15 Final  . WBC 07/26/2017 5.2  4.0 - 10.5 K/uL Final  . RBC 07/26/2017 4.87  4.22 - 5.81 MIL/uL Final  . Hemoglobin 07/26/2017 12.3* 13.0 - 17.0 g/dL Final  . HCT 07/26/2017 36.1* 39.0 - 52.0 % Final  . MCV 07/26/2017 74.1* 78.0 - 100.0 fL Final  . MCH 07/26/2017 25.3* 26.0 - 34.0 pg Final  . MCHC 07/26/2017 34.1  30.0 - 36.0 g/dL Final  . RDW 07/26/2017 15.6* 11.5 - 15.5 % Final  . Platelets 07/26/2017 179  150 - 400 K/uL Final  . Prothrombin Time 07/26/2017 14.5  11.4 - 15.2 seconds Final  . INR 07/26/2017 1.13   Final  Appointment on 07/16/2017  Component Date Value Ref Range Status  . WBC 07/16/2017 7.0  4.0 - 10.3 10e3/uL Final  . NEUT# 07/16/2017 6.1  1.5 - 6.5 10e3/uL Final  . HGB  07/16/2017 13.1  13.0 - 17.1 g/dL Final  . HCT 07/16/2017 38.4  38.4 - 49.9 % Final  . Platelets 07/16/2017 173  140 - 400 10e3/uL Final  . MCV 07/16/2017 75.9* 79.3 - 98.0 fL Final  . MCH 07/16/2017 25.9* 27.2 - 33.4 pg Final  . MCHC 07/16/2017 34.1  32.0 - 36.0 g/dL Final  . RBC 07/16/2017 5.06  4.20 - 5.82 10e6/uL Final  . RDW 07/16/2017 15.5* 11.0 - 14.6 % Final  . lymph# 07/16/2017 0.6* 0.9 - 3.3 10e3/uL Final  . MONO# 07/16/2017 0.3  0.1 - 0.9 10e3/uL Final  . Eosinophils Absolute 07/16/2017 0.0  0.0 - 0.5 10e3/uL Final  . Basophils Absolute 07/16/2017 0.0  0.0 - 0.1 10e3/uL Final  . NEUT% 07/16/2017 86.8* 39.0 - 75.0 % Final  . LYMPH% 07/16/2017 8.2* 14.0 - 49.0 % Final  . MONO% 07/16/2017 4.4  0.0 - 14.0 % Final  . EOS% 07/16/2017 0.6  0.0 - 7.0 % Final  . BASO% 07/16/2017 0.0  0.0 - 2.0 % Final  . Sodium 07/16/2017  132* 136 - 145 mEq/L Final  . Potassium 07/16/2017 3.9  3.5 - 5.1 mEq/L Final  . Chloride 07/16/2017 96* 98 - 109 mEq/L Final  . CO2 07/16/2017 27  22 - 29 mEq/L Final  . Glucose 07/16/2017 95  70 - 140 mg/dl Final   Glucose reference range is for nonfasting patients. Fasting glucose reference range is 70- 100.  Marland Kitchen BUN 07/16/2017 25.2  7.0 - 26.0 mg/dL Final  . Creatinine 07/16/2017 0.9  0.7 - 1.3 mg/dL Final  . Total Bilirubin 07/16/2017 1.27* 0.20 - 1.20 mg/dL Final  . Alkaline Phosphatase 07/16/2017 173* 40 - 150 U/L Final  . AST 07/16/2017 44* 5 - 34 U/L Final  . ALT 07/16/2017 148* 0 - 55 U/L Final  . Total Protein 07/16/2017 7.1  6.4 - 8.3 g/dL Final  . Albumin 07/16/2017 3.7  3.5 - 5.0 g/dL Final  . Calcium 07/16/2017 10.0  8.4 - 10.4 mg/dL Final  . Anion Gap 07/16/2017 9  3 - 11 mEq/L Final  . EGFR 07/16/2017 >90  >90 ml/min/1.73 m2 Final   eGFR is calculated using the CKD-EPI Creatinine Equation (2009)  . LDH 07/16/2017 193  125 - 245 U/L Final  . Uric Acid, Serum 07/16/2017 6.1  2.6 - 7.4 mg/dl Final  . Magnesium 07/16/2017 2.2  1.5 - 2.5 mg/dl Final    . Phosphorus, Ser 07/16/2017 2.8  2.5 - 4.5 mg/dL Final  . Hep C Virus Ab 07/16/2017 0.1  0.0 - 0.9 s/co ratio Final   Comment:                                                  Negative:     < 0.8                                             Indeterminate: 0.8 - 0.9                                                  Positive:     > 0.9                 The CDC recommends that a positive HCV antibody result                 be followed up with a HCV Nucleic Acid Amplification                 test ([id number]).   . Hep B Surface Ab, Qual 07/16/2017 Reactive   Final   Comment:                              Non Reactive: Inconsistent with immunity,                                            less than 10 mIU/mL  Reactive:     Consistent with immunity,                                            greater than 9.9 mIU/mL   . HBsAg Screen 07/16/2017 Negative  Negative Final  . Hep B Core Ab, Tot 07/16/2017 Positive* Negative Final  There may be more visits with results that are not included.    Dg Chest 2 View  Result Date: 10/03/2017 CLINICAL DATA:  Shortness of breath. EXAM: CHEST  2 VIEW COMPARISON:  Radiographs of August 21, 2017. FINDINGS: The heart size and mediastinal contours are within normal limits. Both lungs are clear. No pneumothorax or pleural effusion is noted. Right internal jugular Port-A-Cath is unchanged in position. Hyperexpansion of the lungs is noted. The visualized skeletal structures are unremarkable. IMPRESSION: No active cardiopulmonary disease. Electronically Signed   By: Marijo Conception, M.D.   On: 10/03/2017 10:20   Ct Angio Chest Pe W And/or Wo Contrast  Result Date: 10/03/2017 CLINICAL DATA:  History of lymphoma. There a tract infection. Short of breath. Hypoxia EXAM: CT ANGIOGRAPHY CHEST WITH CONTRAST TECHNIQUE: Multidetector CT imaging of the chest was performed using the standard protocol during bolus administration of intravenous contrast.  Multiplanar CT image reconstructions and MIPs were obtained to evaluate the vascular anatomy. CONTRAST:  80 mL Isovue COMPARISON:  None. FINDINGS: Cardiovascular: There 2 small tubular filling defects within the proximal LEFT lower lobe pulmonary artery and proximal lingular pulmonary artery (image 61, series 5). These are consistent with thromboemboli. These are nonocclusive. No additional filling defects identified within the pulmonary arteries bold No acute findings aorta great vessels. Heart is normal. No evidence of RIGHT ventricular strain with the RIGHT ventricular to LEFT ventricular diameter ratio less than 1. Mediastinum/Nodes: No axillary or supraclavicular adenopathy. No mediastinal hilar adenopathy. No pericardial fluid. Esophagus normal Lungs/Pleura: Within the LEFT lower lobe there is patchy nodular airspace disease involving the superior segment and the basilar segments. There is some consolidation of basilar segments. There is frothy material within the lower lobe bronchi (image 76, series 8). RIGHT lung is clear. Upper Abdomen: Limited view of the liver, kidneys, pancreas are unremarkable. Normal adrenal glands. Musculoskeletal: Again demonstrated lytic and expansile lesions within the posterior LEFT ribs and LEFT scapula (image 45, series 5 and image 56, series 5). Findings consistent bone metastasis previously described. Review of the MIP images confirms the above findings. IMPRESSION: 1. Acute pulmonary embolism within the LEFT lower lobe and lingula. These small emboli are nonocclusive. Overall clot burden is minimal. 2. No evidence of RIGHT ventricular strain. 3. Patchy airspace disease in the LEFT lower lobe. Favor pulmonary infection such as pneumonia or aspiration rather than pulmonary infarction. 4. Frothy material within the LEFT lower lobe bronchus could represent aspirate from tracheal aspiration. Findings conveyed toNATHAN PICKERING on 10/03/2017  at12:55. Electronically Signed   By:  Suzy Bouchard M.D.   On: 10/03/2017 13:00   Ir Nephrostomy Exchange Right  Result Date: 10/04/2017 INDICATION: 56 year old with a right ureter stone and right percutaneous nephrostomy. Concern for infection. Request for a drain exchange. EXAM: RIGHT NEPHROSTOMY TUBE EXCHANGE WITH FLUOROSCOPY Physician: Stephan Minister. Anselm Pancoast, MD COMPARISON:  None. MEDICATIONS: None ANESTHESIA/SEDATION: None CONTRAST:  47m ISOVUE-300 IOPAMIDOL (ISOVUE-300) INJECTION 61% - administered into the collecting system(s) FLUOROSCOPY TIME:  Fluoroscopy Time: 30 seconds, 6 mGy COMPLICATIONS: None immediate. PROCEDURE: The  procedure was explained to the patient. The risks and benefits of the procedure were discussed and the patient's questions were addressed. Informed consent was obtained from the patient. Right flank was prepped and draped in sterile fashion. Maximal barrier sterile technique was utilized including caps, mask, sterile gowns, sterile gloves, sterile drape, hand hygiene and skin antiseptic. Contrast was injected through the nephrostomy tube. Catheter was cut and removed over a Bentson wire. New 10.2 [place] multipurpose drain was reconstituted in the renal pelvis. Contrast was injected to confirm placement. Skin was anesthetized with 1% lidocaine. Catheter was sutured to skin with Prolene suture. Fluoroscopic images were taken and saved for this procedure. FINDINGS: Nephrostomy tube is well positioned within the right renal pelvis. Again noted is a stone in the proximal/mid right ureter. Delayed emptying of contrast distal to this stone. IMPRESSION: Successful exchange of the right percutaneous nephrostomy tube with fluoroscopy. Electronically Signed   By: Markus Daft M.D.   On: 10/04/2017 09:20   Dg Fluoro Guided Loc Of Needle/cath Tip For Spinal Inject Lt  Result Date: 09/18/2017 CLINICAL DATA:  B-cell lymphoma.  For intrathecal chemotherapy EXAM: DIAGNOSTIC LUMBAR PUNCTURE UNDER FLUOROSCOPIC GUIDANCE FLUOROSCOPY TIME:   Fluoroscopy Time:  0 minutes 16 second Radiation Exposure Index (if provided by the fluoroscopic device): Number of Acquired Spot Images: 0 PROCEDURE: Informed consent was obtained from the patient prior to the procedure, including potential complications of headache, allergy, and pain. With the patient prone, the lower back was prepped with Betadine. 1% Lidocaine was used for local anesthesia. Lumbar puncture was performed at the L4-5 level using a 20 gauge needle with return of slightly blood-tinged CSF which cleared, with an opening pressure of 9 cm water. 13.5 ml of CSF were obtained for laboratory studies. 12 mg of methotrexate was injected into the subarachnoid space. The patient tolerated the procedure well and there were no apparent complications. IMPRESSION: Successful lumbar puncture and injection of intrathecal methotrexate Electronically Signed   By: Franchot Gallo M.D.   On: 09/18/2017 13:24     Assessment/Plan   ICD-10-CM   1. Other acute pulmonary embolism without acute cor pulmonale (HCC) I26.99   2. Drug-induced constipation K59.03   3. Electrolyte disturbance E87.8   4. 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 Nephrostomy status ([place]) Z93.6   7. Severe protein-calorie malnutrition ([place]) E43   8. Adjustment disorder with mixed anxiety and depressed mood F43.23     Cont current meds as ordered  F/u with oncology as scheduled  Cont PT/OT/ST as ordered  Follow labs  GOAL: short term rehab and d/c home when medically appropriate. Communicated with pt and nursing.  Will follow  Seena Face S. Perlie Gold  [facility] [address] [place], [place] [address] [phone] Cell (Monday-Friday 8 AM - 5 PM) [phone] After 5 PM and follow prompts

## 2017-10-11 NOTE — Telephone Encounter (Signed)
RX faxed to [facility] @ [phone], phone number [phone] 

## 2017-10-11 NOTE — Assessment & Plan Note (Signed)
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 Initial presentation with severe left hip and knee due to extensive disease involving the skeletal structures of the spine with multilevel compression fractures. Some spinal canal intrusion without obvious cord compression was noted. Additionally, bulky retroperitoneal lymphadenopathy and a soft tissue mass in the thoracic wall were detected. Based on presence of extensive skeletal disease and soft tissue mass in the chest wall, patient is Stage IVB.  Patient was initially treated with steroids and palliative radiation to the spine which resulted in significant symptomatic improvement. Patient has recovered from his radiation therapy. Pretreatment workup included echocardiogram obtained on 07/19/17 demonstrating left ventricular ejection fraction of 50-55%, patient underwent lab work testing which returned back positive for core antibodies for hepatitis B virus. Additional testing was negative for IgM antibodies and negative for hepatitis B DNA. These findings are consistent with either previous infection for which the patient has completely recovered, or previous history of immunization patient does not recall having either.   Following initiation of systemic chemotherapy, patient was admitted to the hospital with septic shock due to urinary tract infection in the setting off obstruction of the renal drainage by kidney stone. Patient responded to stress dose steroids, pressor support, broad-spectrum antibiotics, and volume resuscitation. Has been able to maintain his own pressures after several days and demonstrated resumed adrenal function. We have retested here for hepatitis B and did not detect any signs of infection reactivation.  Nevertheless, patient demonstrated rapid and profound loss of appetite resulting in severe calorie/protein malnutrition, dramatic decrease  in his activity tolerance going from ECOG 2 down to ECOG 4. Renegotiation of chemotherapy was delayed to allow for improvement in the physical state.  At the last visit to the clinic, patient was admitted to the hospital to undergo the second cycle of systemic chemotherapy due to multiple electrolyte abnormalities.  He was aggressively replaced with electrolytes, and received his systemic treatment with significant dose reductions consistent with mini-RCHOP regimen.  Agent also received intrathecal methotrexate injection for prophylaxis of CNS recurrence.  Plan: --Continue electrolyte replacement by mouth and IV if needed --Return to clinic in 1 week for continued electrolyte monitoring, IV fluid support, and possible consideration for the third cycle of systemic chemotherapy. --Patient will need restaging PET/CT prior to the next visit in the clinic to assess disease response to treatment.

## 2017-10-15 ENCOUNTER — Inpatient Hospital Stay
Admission: RE | Admit: 2017-10-15 | Payer: Medicaid Other | Source: Ambulatory Visit | Admitting: Hematology and Oncology

## 2017-10-15 ENCOUNTER — Encounter (HOSPITAL_COMMUNITY): Payer: Medicaid Other

## 2017-10-15 ENCOUNTER — Other Ambulatory Visit: Payer: Self-pay

## 2017-10-15 ENCOUNTER — Ambulatory Visit: Payer: Self-pay | Admitting: Hematology and Oncology

## 2017-10-16 ENCOUNTER — Inpatient Hospital Stay (HOSPITAL_COMMUNITY)
Admission: EM | Admit: 2017-10-16 | Discharge: 2017-11-14 | DRG: 642 | Disposition: A | Discharge status: Skilled Nursing Facility | Payer: Medicaid Other | Attending: Internal Medicine | Admitting: Internal Medicine

## 2017-10-16 ENCOUNTER — Encounter (HOSPITAL_COMMUNITY): Payer: Self-pay

## 2017-10-16 ENCOUNTER — Emergency Department (HOSPITAL_COMMUNITY): Payer: Medicaid Other

## 2017-10-16 ENCOUNTER — Other Ambulatory Visit: Payer: Self-pay

## 2017-10-16 DIAGNOSIS — L89150 Pressure ulcer of sacral region, unstageable: Secondary | ICD-10-CM | POA: Diagnosis present

## 2017-10-16 DIAGNOSIS — R64 Cachexia: Secondary | ICD-10-CM | POA: Diagnosis present

## 2017-10-16 DIAGNOSIS — Z9221 Personal history of antineoplastic chemotherapy: Secondary | ICD-10-CM

## 2017-10-16 DIAGNOSIS — M899 Disorder of bone, unspecified: Secondary | ICD-10-CM

## 2017-10-16 DIAGNOSIS — Z681 Body mass index (BMI) 19 or less, adult: Secondary | ICD-10-CM

## 2017-10-16 DIAGNOSIS — R310 Gross hematuria: Secondary | ICD-10-CM | POA: Diagnosis present

## 2017-10-16 DIAGNOSIS — Z936 Other artificial openings of urinary tract status: Secondary | ICD-10-CM

## 2017-10-16 DIAGNOSIS — R059 Cough, unspecified: Secondary | ICD-10-CM

## 2017-10-16 DIAGNOSIS — R05 Cough: Secondary | ICD-10-CM | POA: Diagnosis not present

## 2017-10-16 DIAGNOSIS — E876 Hypokalemia: Secondary | ICD-10-CM | POA: Diagnosis present

## 2017-10-16 DIAGNOSIS — R627 Adult failure to thrive: Secondary | ICD-10-CM | POA: Diagnosis present

## 2017-10-16 DIAGNOSIS — Z79891 Long term (current) use of opiate analgesic: Secondary | ICD-10-CM

## 2017-10-16 DIAGNOSIS — G893 Neoplasm related pain (acute) (chronic): Secondary | ICD-10-CM | POA: Diagnosis present

## 2017-10-16 DIAGNOSIS — Z87442 Personal history of urinary calculi: Secondary | ICD-10-CM

## 2017-10-16 DIAGNOSIS — R634 Abnormal weight loss: Secondary | ICD-10-CM | POA: Diagnosis present

## 2017-10-16 DIAGNOSIS — E46 Unspecified protein-calorie malnutrition: Secondary | ICD-10-CM | POA: Diagnosis not present

## 2017-10-16 DIAGNOSIS — R000 Tachycardia, unspecified: Secondary | ICD-10-CM | POA: Diagnosis present

## 2017-10-16 DIAGNOSIS — Z79899 Other long term (current) drug therapy: Secondary | ICD-10-CM

## 2017-10-16 DIAGNOSIS — C8338 Diffuse large B-cell lymphoma, lymph nodes of multiple sites: Secondary | ICD-10-CM

## 2017-10-16 DIAGNOSIS — Z91030 Bee allergy status: Secondary | ICD-10-CM

## 2017-10-16 DIAGNOSIS — L89120 Pressure ulcer of left upper back, unstageable: Secondary | ICD-10-CM | POA: Diagnosis present

## 2017-10-16 DIAGNOSIS — C8590 Non-Hodgkin lymphoma, unspecified, unspecified site: Secondary | ICD-10-CM | POA: Diagnosis not present

## 2017-10-16 DIAGNOSIS — J690 Pneumonitis due to inhalation of food and vomit: Secondary | ICD-10-CM | POA: Diagnosis not present

## 2017-10-16 DIAGNOSIS — M898X9 Other specified disorders of bone, unspecified site: Secondary | ICD-10-CM

## 2017-10-16 DIAGNOSIS — N201 Calculus of ureter: Secondary | ICD-10-CM

## 2017-10-16 DIAGNOSIS — Z809 Family history of malignant neoplasm, unspecified: Secondary | ICD-10-CM

## 2017-10-16 DIAGNOSIS — N139 Obstructive and reflux uropathy, unspecified: Secondary | ICD-10-CM | POA: Diagnosis present

## 2017-10-16 DIAGNOSIS — C7951 Secondary malignant neoplasm of bone: Secondary | ICD-10-CM | POA: Diagnosis present

## 2017-10-16 DIAGNOSIS — Z923 Personal history of irradiation: Secondary | ICD-10-CM

## 2017-10-16 DIAGNOSIS — Z86711 Personal history of pulmonary embolism: Secondary | ICD-10-CM

## 2017-10-16 DIAGNOSIS — F329 Major depressive disorder, single episode, unspecified: Secondary | ICD-10-CM | POA: Diagnosis present

## 2017-10-16 DIAGNOSIS — R0902 Hypoxemia: Secondary | ICD-10-CM | POA: Diagnosis not present

## 2017-10-16 DIAGNOSIS — Z66 Do not resuscitate: Secondary | ICD-10-CM | POA: Diagnosis present

## 2017-10-16 DIAGNOSIS — N211 Calculus in urethra: Secondary | ICD-10-CM | POA: Diagnosis present

## 2017-10-16 DIAGNOSIS — I2699 Other pulmonary embolism without acute cor pulmonale: Secondary | ICD-10-CM | POA: Diagnosis present

## 2017-10-16 DIAGNOSIS — Z7189 Other specified counseling: Secondary | ICD-10-CM | POA: Diagnosis not present

## 2017-10-16 DIAGNOSIS — G894 Chronic pain syndrome: Secondary | ICD-10-CM | POA: Diagnosis present

## 2017-10-16 DIAGNOSIS — R600 Localized edema: Secondary | ICD-10-CM | POA: Diagnosis present

## 2017-10-16 DIAGNOSIS — R630 Anorexia: Secondary | ICD-10-CM | POA: Diagnosis not present

## 2017-10-16 DIAGNOSIS — Z960 Presence of urogenital implants: Secondary | ICD-10-CM | POA: Diagnosis not present

## 2017-10-16 DIAGNOSIS — E878 Other disorders of electrolyte and fluid balance, not elsewhere classified: Secondary | ICD-10-CM

## 2017-10-16 DIAGNOSIS — E43 Unspecified severe protein-calorie malnutrition: Secondary | ICD-10-CM | POA: Diagnosis present

## 2017-10-16 DIAGNOSIS — Z7952 Long term (current) use of systemic steroids: Secondary | ICD-10-CM

## 2017-10-16 DIAGNOSIS — Z515 Encounter for palliative care: Secondary | ICD-10-CM | POA: Diagnosis not present

## 2017-10-16 DIAGNOSIS — I959 Hypotension, unspecified: Secondary | ICD-10-CM | POA: Diagnosis not present

## 2017-10-16 DIAGNOSIS — R112 Nausea with vomiting, unspecified: Secondary | ICD-10-CM

## 2017-10-16 DIAGNOSIS — R609 Edema, unspecified: Secondary | ICD-10-CM | POA: Diagnosis not present

## 2017-10-16 DIAGNOSIS — M549 Dorsalgia, unspecified: Secondary | ICD-10-CM | POA: Diagnosis present

## 2017-10-16 DIAGNOSIS — Z7901 Long term (current) use of anticoagulants: Secondary | ICD-10-CM

## 2017-10-16 DIAGNOSIS — C8330 Diffuse large B-cell lymphoma, unspecified site: Secondary | ICD-10-CM | POA: Diagnosis present

## 2017-10-16 DIAGNOSIS — R5381 Other malaise: Secondary | ICD-10-CM | POA: Diagnosis not present

## 2017-10-16 LAB — CBC WITH DIFFERENTIAL/PLATELET
BASOS ABS: 0 10*3/uL (ref 0.0–0.1)
BASOS PCT: 0 %
EOS ABS: 0 10*3/uL (ref 0.0–0.7)
EOS PCT: 0 %
HCT: 35.3 % — ABNORMAL LOW (ref 39.0–52.0)
Hemoglobin: 11.2 g/dL — ABNORMAL LOW (ref 13.0–17.0)
Lymphocytes Relative: 13 %
Lymphs Abs: 1.3 10*3/uL (ref 0.7–4.0)
MCH: 25.7 pg — ABNORMAL LOW (ref 26.0–34.0)
MCHC: 31.7 g/dL (ref 30.0–36.0)
MCV: 81.1 fL (ref 78.0–100.0)
MONO ABS: 0.5 10*3/uL (ref 0.1–1.0)
MONOS PCT: 5 %
NEUTROS ABS: 7.8 10*3/uL — AB (ref 1.7–7.7)
Neutrophils Relative %: 82 %
Platelets: 378 10*3/uL (ref 150–400)
RBC: 4.35 MIL/uL (ref 4.22–5.81)
RDW: 20.5 % — ABNORMAL HIGH (ref 11.5–15.5)
WBC: 9.6 10*3/uL (ref 4.0–10.5)

## 2017-10-16 LAB — PROTIME-INR
INR: 1.44
Prothrombin Time: 17.5 seconds — ABNORMAL HIGH (ref 11.4–15.2)

## 2017-10-16 LAB — URINALYSIS, ROUTINE W REFLEX MICROSCOPIC
BILIRUBIN URINE: NEGATIVE
Glucose, UA: NEGATIVE mg/dL
Ketones, ur: 80 mg/dL — AB
NITRITE: NEGATIVE
PH: 5 (ref 5.0–8.0)
Protein, ur: 100 mg/dL — AB
SPECIFIC GRAVITY, URINE: 1.023 (ref 1.005–1.030)
Squamous Epithelial / LPF: NONE SEEN

## 2017-10-16 LAB — COMPREHENSIVE METABOLIC PANEL
ALK PHOS: 112 U/L (ref 38–126)
ALT: 25 U/L (ref 17–63)
ANION GAP: 19 — AB (ref 5–15)
AST: 28 U/L (ref 15–41)
Albumin: 2.9 g/dL — ABNORMAL LOW (ref 3.5–5.0)
BILIRUBIN TOTAL: 1.5 mg/dL — AB (ref 0.3–1.2)
BUN: 5 mg/dL — AB (ref 6–20)
CALCIUM: 6.4 mg/dL — AB (ref 8.9–10.3)
CO2: 20 mmol/L — AB (ref 22–32)
CREATININE: 0.38 mg/dL — AB (ref 0.61–1.24)
Chloride: 99 mmol/L — ABNORMAL LOW (ref 101–111)
GFR calc Af Amer: >60 mL/min (ref >60)
GFR calc non Af Amer: >60 mL/min (ref >60)
GLUCOSE: 90 mg/dL (ref 65–99)
Potassium: 2.7 mmol/L — CL (ref 3.5–5.1)
SODIUM: 138 mmol/L (ref 135–145)
TOTAL PROTEIN: 5.7 g/dL — AB (ref 6.5–8.1)

## 2017-10-16 LAB — TSH: TSH: 2.26 u[IU]/mL (ref 0.350–4.500)

## 2017-10-16 LAB — MAGNESIUM: MAGNESIUM: 2.2 mg/dL (ref 1.7–2.4)

## 2017-10-16 LAB — APTT: aPTT: 68 seconds — ABNORMAL HIGH (ref 24–36)

## 2017-10-16 MED ORDER — LORAZEPAM 0.5 MG PO TABS
0.5000 mg | ORAL_TABLET | Freq: Four times a day (QID) | ORAL | Status: DC | PRN
  Administered 2017-10-28 – 2017-11-09 (×8): 0.5 mg via ORAL
  Filled 2017-10-16 (×8): qty 1

## 2017-10-16 MED ORDER — KETOROLAC TROMETHAMINE 30 MG/ML IJ SOLN
30.0000 mg | Freq: Four times a day (QID) | INTRAMUSCULAR | Status: AC | PRN
  Administered 2017-10-17 – 2017-10-18 (×2): 30 mg via INTRAVENOUS
  Filled 2017-10-16 (×2): qty 1

## 2017-10-16 MED ORDER — OXYCODONE HCL ER 10 MG PO T12A
10.0000 mg | EXTENDED_RELEASE_TABLET | Freq: Two times a day (BID) | ORAL | Status: DC
  Administered 2017-10-16 – 2017-11-14 (×56): 10 mg via ORAL
  Filled 2017-10-16 (×57): qty 1

## 2017-10-16 MED ORDER — ACETAMINOPHEN 325 MG PO TABS: 650.0000 mg | ORAL_TABLET | Freq: Four times a day (QID) | ORAL | Status: DC | PRN

## 2017-10-16 MED ORDER — LABETALOL HCL 5 MG/ML IV SOLN
5.0000 mg | INTRAVENOUS | Status: DC | PRN
  Filled 2017-10-16: qty 4

## 2017-10-16 MED ORDER — PANTOPRAZOLE SODIUM 40 MG PO TBEC
40.0000 mg | DELAYED_RELEASE_TABLET | Freq: Every day | ORAL | Status: DC
  Administered 2017-10-17 – 2017-11-14 (×29): 40 mg via ORAL
  Filled 2017-10-16 (×29): qty 1

## 2017-10-16 MED ORDER — SODIUM CHLORIDE 0.9 % IV BOLUS (SEPSIS)
1000.0000 mL | Freq: Once | INTRAVENOUS | Status: AC
  Administered 2017-10-16: 1000 mL via INTRAVENOUS

## 2017-10-16 MED ORDER — PREDNISONE 20 MG PO TABS
20.0000 mg | ORAL_TABLET | Freq: Every day | ORAL | Status: DC
  Administered 2017-10-17 – 2017-11-14 (×28): 20 mg via ORAL
  Filled 2017-10-16 (×28): qty 1

## 2017-10-16 MED ORDER — POTASSIUM CHLORIDE 10 MEQ/100ML IV SOLN
10.0000 meq | Freq: Once | INTRAVENOUS | Status: AC
  Administered 2017-10-16: 10 meq via INTRAVENOUS
  Filled 2017-10-16: qty 100

## 2017-10-16 MED ORDER — OLANZAPINE 5 MG PO TBDP
2.5000 mg | ORAL_TABLET | Freq: Every day | ORAL | Status: DC
  Administered 2017-10-16 – 2017-10-24 (×9): 2.5 mg via ORAL
  Filled 2017-10-16 (×10): qty 0.5

## 2017-10-16 MED ORDER — SODIUM CHLORIDE 0.9 % IV SOLN
INTRAVENOUS | Status: DC
  Administered 2017-10-16 – 2017-10-26 (×11): via INTRAVENOUS

## 2017-10-16 MED ORDER — SODIUM CHLORIDE 0.9 % IV SOLN
1.0000 g | Freq: Once | INTRAVENOUS | Status: AC
  Administered 2017-10-16: 1 g via INTRAVENOUS
  Filled 2017-10-16: qty 10

## 2017-10-16 MED ORDER — SODIUM CHLORIDE 0.9% FLUSH
10.0000 mL | INTRAVENOUS | Status: DC | PRN
  Administered 2017-10-18: 10 mL
  Administered 2017-10-19: 20 mL
  Administered 2017-10-20 – 2017-11-07 (×10): 10 mL
  Administered 2017-11-08: 20 mL
  Administered 2017-11-09 – 2017-11-13 (×3): 10 mL
  Administered 2017-11-14: 20 mL
  Administered 2017-11-14: 10 mL
  Filled 2017-10-16 (×18): qty 40

## 2017-10-16 MED ORDER — OXYCODONE HCL 5 MG PO TABS
10.0000 mg | ORAL_TABLET | ORAL | Status: DC | PRN
  Administered 2017-10-16 – 2017-11-14 (×43): 10 mg via ORAL
  Filled 2017-10-16 (×46): qty 2

## 2017-10-16 MED ORDER — ACETAMINOPHEN 650 MG RE SUPP: 650.0000 mg | Freq: Four times a day (QID) | RECTAL | Status: DC | PRN

## 2017-10-16 MED ORDER — DRONABINOL 5 MG PO CAPS
5.0000 mg | ORAL_CAPSULE | Freq: Every day | ORAL | Status: DC
  Administered 2017-10-17 – 2017-10-24 (×7): 5 mg via ORAL
  Filled 2017-10-16 (×7): qty 2

## 2017-10-16 MED ORDER — MAGNESIUM SULFATE 2 GM/50ML IV SOLN
2.0000 g | Freq: Once | INTRAVENOUS | Status: AC
  Administered 2017-10-16: 2 g via INTRAVENOUS
  Filled 2017-10-16: qty 50

## 2017-10-16 MED ORDER — SULFAMETHOXAZOLE-TRIMETHOPRIM 200-40 MG/5ML PO SUSP
20.0000 mL | ORAL | Status: DC
  Administered 2017-10-17: 20 mL via ORAL
  Filled 2017-10-16: qty 20

## 2017-10-16 MED ORDER — MAGNESIUM OXIDE 400 (241.3 MG) MG PO TABS
400.0000 mg | ORAL_TABLET | Freq: Two times a day (BID) | ORAL | Status: DC
  Administered 2017-10-16 – 2017-11-14 (×57): 400 mg via ORAL
  Filled 2017-10-16 (×57): qty 1

## 2017-10-16 NOTE — ED Provider Notes (Signed)
[facility] DEPT Provider Note   CSN: [id number] Arrival date & time: 10/16/17  1110     History   Chief Complaint No chief complaint on file.   HPI Cory Ibarra is a 56 y.o. male.  HPI 56 year old male with history of aggressive B-cell lymphoma currently undergoing chemotherapy, right ureteral stone with nephrostomy tube in place, recently diagnosed with PE on Eliquis, failure to thrive, recent sepsis admission for pneumonia that presents to the ED from a skilled nursing facility for blood in his nephrostomy tube and tachycardia.  The patient states that he noticed blood in his nephrostomy tube yesterday.  States that the urologist told him that if he noticed blood in here to come to the ED for evaluation.  Patient denies any associated pain.  Patient does have an indwelling Foley.  Patient has been failing to thrive while undergoing chemotherapy causing him to require skilled nursing facility placement.  Patient has had several electrolyte abnormalities including potassium and calcium.    Patient discharged 11/5 please review that note for further detail.  Patient was started on blood thinners due to acute PE.  He was treated in the hospital cefepime and vancomycin for sepsis due to pneumonia.  Pt denies any fever, chill, ha, vision changes, lightheadedness, dizziness, congestion, neck pain, cp, sob, cough, abd pain, n/v/d, urinary symptoms, change in bowel habits, melena, hematochezia, lower extremity paresthesias.  Past Medical History:  Diagnosis Date  . Cancer ([place])   . Tendonitis     Patient Active Problem List   Diagnosis Date Noted  . Pressure injury of skin 10/04/2017  . Dehydration   . Hypoxia   . Pulmonary emboli ([place]) 10/03/2017  . Healthcare-associated pneumonia   . Obstructive uropathy   . Liver function test abnormality   . Renal calculus or stone 09/19/2017  . Leakage of nephrostomy catheter ([place]) 09/19/2017  . Nephrostomy status  ([place]) 09/19/2017  . Gastroesophageal reflux disease without esophagitis 09/19/2017  . Hypokalemia 09/18/2017  . Hypomagnesemia 09/18/2017  . Electrolyte abnormality   . Hypovolemia 09/17/2017  . Chemotherapy-induced nausea 09/17/2017  . Constipation 09/17/2017  . DLBCL (diffuse large B cell lymphoma) ([place]) 09/17/2017  . Hypocalcemia 09/17/2017  . Severe protein-calorie malnutrition ([place])   . Adjustment disorder with mixed anxiety and depressed mood   . Palliative care encounter   . Admission for antineoplastic chemotherapy   . Septic shock due to Escherichia coli ([place])   . Neutropenia ([place]) 08/21/2017  . Gross hematuria   . Lactic acidosis   . Neutropenic sepsis ([place])   . Hypophosphatemia 08/20/2017  . Diffuse large B cell lymphoma ([place]) 07/03/2017  . Bony metastasis ([place]) 06/29/2017  . Chest wall mass   . 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 Closed compression fracture of L3 lumbar vertebra ([place]) 06/28/2017  . Severe back pain 06/28/2017  . Weight loss, unintentional 06/28/2017    Past Surgical History:  Procedure Laterality Date  . HERNIA REPAIR  1988   Dr. Vida Rigger  . IR FLUORO GUIDE PORT INSERTION RIGHT  07/26/2017  . IR NEPHROSTOMY EXCHANGE RIGHT  10/04/2017  . IR NEPHROSTOMY PLACEMENT RIGHT  08/21/2017  . IR US GUIDE VASC ACCESS RIGHT  07/26/2017       Home Medications    Prior to Admission medications   Medication Sig Start Date End Date Taking? Authorizing Provider  apixaban (ELIQUIS) 5 MG TABS tablet Take 2 tabs po BID x7 days, then take 1 tab po BID 10/08/17  Yes Patrecia Pour,  MD  calcium elemental as carbonate (BARIATRIC TUMS ULTRA) 400 MG chewable tablet Chew 3 tablets (1,200 mg total) 3 (three) times daily by mouth. 10/08/17  Yes Patrecia Pour, MD  chlorhexidine (PERIDEX) 0.12 % solution 15 mLs by Mouth Rinse route 2 (two) times daily. 09/03/17  Yes Theodis Blaze, MD  Cholecalciferol 50000 units TABS Take 50,000 Units once a week by mouth. for 8 weeks, then return  for level recheck Patient taking differently: Take 50,000 Units every Monday by mouth. for 8 weeks, then return for level recheck 10/08/17  Yes Patrecia Pour, MD  dronabinol (MARINOL) 5 MG capsule Take 1 capsule (5 mg total) daily before lunch by mouth. 10/11/17  Yes Gerlene Fee, NP  Lidocaine-Prilocaine, Bulk, 2.5-2.5 % CREA Apply to affected areas topically one time daily   Yes [provider]  LORazepam (ATIVAN) 0.5 MG tablet Take 1 tablet (0.5 mg total) every 6 (six) hours as needed for up to 14 days by mouth (Nausea or vomiting). 10/11/17 10/25/17 Yes Gerlene Fee, NP  magnesium oxide (MAG-OX) 400 (241.3 Mg) MG tablet Take 1 tablet (400 mg total) by mouth 2 (two) times daily. 09/25/17 10/25/17 Yes Ardath Sax, MD  Multiple Vitamin (MULTIVITAMIN WITH MINERALS) TABS tablet Take 1 tablet by mouth daily. 09/25/17  Yes Perlov, Marinell Blight, MD  OLANZapine zydis (ZYPREXA) 5 MG disintegrating tablet Take 0.5 tablets (2.5 mg total) by mouth at bedtime. 09/03/17  Yes Theodis Blaze, MD  oxyCODONE (OXYCONTIN) 10 mg 12 hr tablet Take 1 tablet (10 mg total) every 12 (twelve) hours by mouth. 10/11/17  Yes Gerlene Fee, NP  Oxycodone HCl 10 MG TABS Take 10 mg every 4 (four) hours as needed by mouth (for pain).   Yes [provider]  pantoprazole (PROTONIX) 40 MG tablet Take 40 mg by mouth daily with lunch.    Yes [provider]  polyethylene glycol (MIRALAX / GLYCOLAX) packet Take 17 g daily as needed by mouth for moderate constipation.    Yes [provider]  potassium & sodium phosphates (PHOS-NAK) 280-160-250 MG PACK Take 1 packet by mouth 4 (four) times daily -  before meals and at bedtime. 09/25/17 10/25/17 Yes Perlov, Marinell Blight, MD  predniSONE (DELTASONE) 10 MG tablet Take 2 tablets (20 mg total) daily with breakfast by mouth. 10/08/17  Yes Patrecia Pour, MD  prochlorperazine (COMPAZINE) 10 MG tablet Take 10 mg every 6 (six) hours as needed by mouth for  nausea or vomiting.   Yes [provider]  senna-docusate (SENOKOT-S) 8.6-50 MG tablet Take 1 tablet by mouth daily as needed for mild constipation.   Yes [provider]  sulfamethoxazole-trimethoprim (BACTRIM,SEPTRA) 200-40 MG/5ML suspension Take 20 mLs by mouth every Monday, Wednesday, and Friday. Continuous   Yes [provider]  oxyCODONE (ROXICODONE) 5 MG immediate release tablet Take 1 tablet (5 mg total) every 4 (four) hours as needed by mouth for severe pain. Patient not taking: Reported on 10/16/2017 10/11/17   Gerlene Fee, NP    Family History Family History  Problem Relation Age of Onset  . Cancer Father     Social History Social History   Tobacco Use  . Smoking status: Never Smoker  . Smokeless tobacco: Never Used  Substance Use Topics  . Alcohol use: Yes    Comment: occasionally/socially  . Drug use: No     Allergies   Bee venom   Review of Systems Review of Systems  Constitutional:  Negative for chills and fever.  HENT: Negative for congestion and sore throat.   Eyes: Negative for visual disturbance.  Respiratory: Negative for cough and shortness of breath.   Cardiovascular: Negative for chest pain, palpitations and leg swelling.  Gastrointestinal: Negative for abdominal pain, diarrhea, nausea and vomiting.  Genitourinary: Positive for hematuria. Negative for dysuria, flank pain, frequency and urgency.  Musculoskeletal: Negative for arthralgias and myalgias.  Skin: Negative for rash.  Neurological: Negative for dizziness, syncope, weakness, light-headedness, numbness and headaches.  Psychiatric/Behavioral: Negative for sleep disturbance. The patient is not nervous/anxious.      Physical Exam Updated Vital Signs BP 110/73   Pulse (!) 118   Temp 98.2 F (36.8 C) (Oral)   Resp 14   SpO2 99%   Physical Exam  Constitutional: He is oriented to person, place, and time. He appears well-developed and well-nourished.   Non-toxic appearance. No distress.  Chronically ill-appearing.  Cachectic appearing.  HENT:  Head: Normocephalic and atraumatic.  Mouth/Throat: Oropharynx is clear and moist.  Eyes: Conjunctivae are normal. Pupils are equal, round, and reactive to light. Right eye exhibits no discharge. Left eye exhibits no discharge.  Neck: Normal range of motion. Neck supple.  Cardiovascular: Regular rhythm, normal heart sounds and intact distal pulses. Exam reveals no gallop and no friction rub.  No murmur heard. Tachycardia noted  Pulmonary/Chest: Effort normal and breath sounds normal. No stridor. No respiratory distress. He has no wheezes. He has no rales. He exhibits no tenderness.  Abdominal: Soft. Bowel sounds are normal. He exhibits no distension. There is no tenderness. There is no rigidity, no rebound, no guarding, no CVA tenderness, no tenderness at McBurney's point and negative Murphy's sign.  Urostomy to right flank.  Somewhat concentrated urine.  Foley catheter in place in his penis.  Cloudy urine.   Musculoskeletal: Normal range of motion. He exhibits no tenderness.  Lymphadenopathy:    He has no cervical adenopathy.  Neurological: He is alert and oriented to person, place, and time.  Skin: Skin is warm and dry. Capillary refill takes less than 2 seconds. No rash noted.  Psychiatric: His behavior is normal. Judgment and thought content normal.  Nursing note and vitals reviewed.    ED Treatments / Results  Labs (all labs ordered are listed, but only abnormal results are displayed) Labs Reviewed  COMPREHENSIVE METABOLIC PANEL - Abnormal; Notable for the following components:      Result Value   Potassium 2.7 (*)    Chloride 99 (*)    CO2 20 (*)    BUN 5 (*)    Creatinine, Ser 0.38 (*)    Calcium 6.4 (*)    Total Protein 5.7 (*)    Albumin 2.9 (*)    Total Bilirubin 1.5 (*)    Anion gap 19 (*)    All other components within normal limits  CBC WITH DIFFERENTIAL/PLATELET -  Abnormal; Notable for the following components:   Hemoglobin 11.2 (*)    HCT 35.3 (*)    MCH 25.7 (*)    RDW 20.5 (*)    Neutro Abs 7.8 (*)    All other components within normal limits  URINALYSIS, ROUTINE W REFLEX MICROSCOPIC - Abnormal; Notable for the following components:   APPearance CLOUDY (*)    Hgb urine dipstick LARGE (*)    Ketones, ur 80 (*)    Protein, ur 100 (*)    Leukocytes, UA LARGE (*)    Bacteria, UA FEW (*)    All other  components within normal limits  PROTIME-INR - Abnormal; Notable for the following components:   Prothrombin Time 17.5 (*)    All other components within normal limits  APTT - Abnormal; Notable for the following components:   aPTT 68 (*)    All other components within normal limits  MAGNESIUM    EKG  EKG Interpretation None       Radiology US Renal  Result Date: 10/16/2017 CLINICAL DATA:  Blood draining from the nephrostomy tube EXAM: RENAL / URINARY TRACT ULTRASOUND COMPLETE COMPARISON:  Fluoro spot radiographs of October 04, 2017 revealing a right nephrostomy and contrast in the right ureter. FINDINGS: Right Kidney: Length: 10.4 cm. The nephrostomy tube's pigtail can be seen within the mid renal collecting system. There is no hydronephrosis. No perinephric fluid collection is observed. The cortical echotexture is remains lower than that of the adjacent liver. Left Kidney: Length: 11.5 cm. Echogenicity within normal limits. No mass or hydronephrosis visualized. Bladder: The urinary bladder is decompressed by a Foley catheter. IMPRESSION: No etiology for the bloody nephrostomy tube drainage is observed. The proximal pigtail of the nephrostomy tube appears to lie in the mid renal collecting system. No hydronephrosis on the right or left. Electronically Signed   By: David  Martinique M.D.   On: 10/16/2017 13:54    Procedures Procedures (including critical care time)  Medications Ordered in ED Medications  potassium chloride 10 mEq in 100 mL IVPB  (not administered)  magnesium sulfate IVPB 2 g 50 mL (2 g Intravenous New Bag/Given 10/16/17 1526)  calcium gluconate 1 g in sodium chloride 0.9 % 100 mL IVPB (not administered)  sodium chloride 0.9 % bolus 1,000 mL (0 mLs Intravenous Stopped 10/16/17 1427)     Initial Impression / Assessment and Plan / ED Course  I have reviewed the triage vital signs and the nursing notes.  Pertinent labs & imaging results that were available during my care of the patient were reviewed by me and considered in my medical decision making (see chart for details).     Patient presents the ED for evaluation of tachycardia and blood in his nephrostomy tube.  Patient has known ureteral stone that is not a surgical candidate and is followed by urology.  Patient with failure to thrive for chemotherapy for advanced B cell lymphoma with several electrolyte abnormalities.  On exam patient is overall well-appearing.  He is tachycardic with a heart rate of 120.  Blood pressure is stable.  Patient is afebrile.  Abdominal exam is benign without any focal tenderness.  There is blood noted in his nephrostomy tube.  Lab work reveals no leukocytosis.  Patient's potassium is 2.7 with history of same.  This was replaced with IV potassium.  Patient also given magnesium and magnesium level is pending at this time.  The patient's calcium is 6.4 with history of hypocalcemia.  Patient on calcium supplementation at home and takes it regularly. Replaced with iv calcium. Hemoglobin appears at baseline.  Patient's PTT and PT are elevated recently started on blood thinners for acute PE.  UA reveals large amount of blood, too numerous to count WBCs and a few bacteria with budding yeast.  Review patient's prior urinalysis and this seems similar.  Patient is afebrile with no leukocytosis.  Doubt UA however will send for culture.  Patient given fluid bolus in the ED and remains tachycardic.  EKG reveals no acute ab normalities will be associate  with his hypocalcemia or hypokalemia.  Renal ultrasound reveals no hydronephrosis and  shows that the nephrostomy tube is in place.  I did speak with Dr. Gilford Rile with urology concerning patient.  He did not feel like urology need to be involved in patient's care.  He states that if they nephrostomy tube is not draining we will can consult interventional radiology to flush the tube however the tube seems to be draining.  Feel the patient's hematuria likely due to ureteral stone versus recently starting on blood thinners for acute PE.  Given patient's severe electrolyte abnormalities with persistent tachycardia and hematuria feel the patient needs admission.  Spoke with Dr. Wyline Copas with hospital medicine who agrees to admission will place admission orders.  Patient remains hemodynamically stable at this time and was updated on plan of care.  Care discussed with my attending Dr. Sherry Ruffing who is agreeable with the above plan.   Final Clinical Impressions(s) / ED Diagnoses   Final diagnoses:  Hypocalcemia  Hypokalemia  Tachycardia    ED Discharge Orders    None       Aaron Edelman 10/16/17 1603    Tegeler, Gwenyth Allegra, MD 10/16/17 2026

## 2017-10-16 NOTE — ED Triage Notes (Signed)
Patient presented to ed via ems with c/o blood in the urine. Pt have nephrostomy tube. Pt is coming from [place] home.

## 2017-10-16 NOTE — H&P (Signed)
History and Physical    Cory Ibarra MVH:[id number] DOB: 01-29-1961 DOA: 10/16/2017  PCP: [name], [name]  Patient coming from: [place]  Chief Complaint: Hematuria  HPI: Cory Ibarra is a 56 y.o. 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  Of note, patient was recently diagnosed with pulmonary embolism and has been continued on Eliquis prior to admission.  During workup, patient was also noted to be tachycardic and was subsequently referred to the emergency department for further workup.  ED Course: In the emergency department, patient was noted to have heart rate in the low 120s.  Potassium was noted to be 2.7 with calcium of 6.4.  Most recent magnesium was noted to be 1.6.  Patient was given 2 g of magnesium in the emergency department.  Repeat magnesium level is pending.  Even electrolyte abnormalities, hospitalist service was consulted for consideration for admission  Review of Systems:  Review of Systems  Constitutional: Negative for chills, fever and malaise/fatigue.  HENT: Negative for congestion, ear pain, nosebleeds and tinnitus.   Eyes: Negative for double vision, photophobia and pain.  Respiratory: Negative for hemoptysis, sputum production and shortness of breath.   Cardiovascular: Negative for palpitations, orthopnea and claudication.  Gastrointestinal: Negative for abdominal pain, nausea and vomiting.  Genitourinary: Positive for hematuria. Negative for frequency and urgency.  Musculoskeletal: Negative for back pain, joint pain and neck pain.  Neurological: Negative for tingling, tremors, sensory change, seizures, loss of consciousness and weakness.  Psychiatric/Behavioral: Negative for hallucinations, memory loss and substance abuse. The patient is not  nervous/anxious.     Past Medical History:  Diagnosis Date  . Cancer ([place])   . Tendonitis     Past Surgical History:  Procedure Laterality Date  . HERNIA REPAIR  1988   Dr. Vida Rigger  . IR FLUORO GUIDE PORT INSERTION RIGHT  07/26/2017  . IR NEPHROSTOMY EXCHANGE RIGHT  10/04/2017  . IR NEPHROSTOMY PLACEMENT RIGHT  08/21/2017  . IR US GUIDE VASC ACCESS RIGHT  07/26/2017     reports that  has never smoked. he has never used smokeless tobacco. He reports that he drinks alcohol. He reports that he does not use drugs.  Allergies  Allergen Reactions  . Bee Venom Swelling    Family History  Problem Relation Age of Onset  . Cancer Father     Prior to Admission medications   Medication Sig Start Date End Date Taking? Authorizing Provider  apixaban (ELIQUIS) 5 MG TABS tablet Take 2 tabs po BID x7 days, then take 1 tab po BID 10/08/17  Yes Patrecia Pour, MD  calcium elemental as carbonate (BARIATRIC TUMS ULTRA) 400 MG chewable tablet Chew 3 tablets (1,200 mg total) 3 (three) times daily by mouth. 10/08/17  Yes Patrecia Pour, MD  chlorhexidine (PERIDEX) 0.12 % solution 15 mLs by Mouth Rinse route 2 (two) times daily. 09/03/17  Yes Theodis Blaze, MD  Cholecalciferol 50000 units TABS Take 50,000 Units once a week by mouth. for 8 weeks, then return for level recheck Patient taking differently: Take 50,000 Units every Monday by mouth. for 8 weeks, then return for level recheck 10/08/17  Yes Patrecia Pour, MD  dronabinol (MARINOL) 5 MG capsule Take 1 capsule (5 mg total) daily before lunch by mouth. 10/11/17  Yes Gerlene Fee, NP  Lidocaine-Prilocaine, Bulk, 2.5-2.5 % CREA  Apply to affected areas topically one time daily   Yes [provider]  LORazepam (ATIVAN) 0.5 MG tablet Take 1 tablet (0.5 mg total) every 6 (six) hours as needed for up to 14 days by mouth (Nausea or vomiting). 10/11/17 10/25/17 Yes Gerlene Fee, NP  magnesium oxide (MAG-OX) 400 (241.3 Mg) MG tablet Take 1 tablet (400  mg total) by mouth 2 (two) times daily. 09/25/17 10/25/17 Yes Ardath Sax, MD  Multiple Vitamin (MULTIVITAMIN WITH MINERALS) TABS tablet Take 1 tablet by mouth daily. 09/25/17  Yes Perlov, Marinell Blight, MD  OLANZapine zydis (ZYPREXA) 5 MG disintegrating tablet Take 0.5 tablets (2.5 mg total) by mouth at bedtime. 09/03/17  Yes Theodis Blaze, MD  oxyCODONE (OXYCONTIN) 10 mg 12 hr tablet Take 1 tablet (10 mg total) every 12 (twelve) hours by mouth. 10/11/17  Yes Gerlene Fee, NP  Oxycodone HCl 10 MG TABS Take 10 mg every 4 (four) hours as needed by mouth (for pain).   Yes [provider]  pantoprazole (PROTONIX) 40 MG tablet Take 40 mg by mouth daily with lunch.    Yes [provider]  polyethylene glycol (MIRALAX / GLYCOLAX) packet Take 17 g daily as needed by mouth for moderate constipation.    Yes [provider]  potassium & sodium phosphates (PHOS-NAK) 280-160-250 MG PACK Take 1 packet by mouth 4 (four) times daily -  before meals and at bedtime. 09/25/17 10/25/17 Yes Perlov, Marinell Blight, MD  predniSONE (DELTASONE) 10 MG tablet Take 2 tablets (20 mg total) daily with breakfast by mouth. 10/08/17  Yes Patrecia Pour, MD  prochlorperazine (COMPAZINE) 10 MG tablet Take 10 mg every 6 (six) hours as needed by mouth for nausea or vomiting.   Yes [provider]  senna-docusate (SENOKOT-S) 8.6-50 MG tablet Take 1 tablet by mouth daily as needed for mild constipation.   Yes [provider]  sulfamethoxazole-trimethoprim (BACTRIM,SEPTRA) 200-40 MG/5ML suspension Take 20 mLs by mouth every Monday, Wednesday, and Friday. Continuous   Yes [provider]  oxyCODONE (ROXICODONE) 5 MG immediate release tablet Take 1 tablet (5 mg total) every 4 (four) hours as needed by mouth for severe pain. Patient not taking: Reported on 10/16/2017 10/11/17   Gerlene Fee, NP    Physical Exam: Vitals:   10/16/17 1300 10/16/17 1400 10/16/17 1430 10/16/17 1500  BP:  110/77 110/73 106/74 102/75  Pulse:   (!) 107 (!) 106  Resp: 13 14 15 16   Temp:      TempSrc:      SpO2:   100% 100%    Constitutional: NAD, calm, comfortable Vitals:   10/16/17 1300 10/16/17 1400 10/16/17 1430 10/16/17 1500  BP: 110/77 110/73 106/74 102/75  Pulse:   (!) 107 (!) 106  Resp: 13 14 15 16   Temp:      TempSrc:      SpO2:   100% 100%   Eyes: PERRL, lids and conjunctivae normal ENMT: Mucous membranes are moist. Posterior pharynx clear of any exudate or lesions.Normal dentition.  Neck: normal, supple, no masses, no thyromegaly Respiratory: clear to auscultation bilaterally, no wheezing, no crackles. Normal respiratory effort. No accessory muscle use.  Cardiovascular: Regular rate and rhythm, no murmurs / rubs / gallops. No extremity edema. 2+ pedal pulses. No carotid bruits.  Abdomen: no tenderness, no masses palpated. No hepatosplenomegaly. Bowel sounds positive.  Musculoskeletal: no clubbing / cyanosis. No joint deformity upper and lower extremities. Good ROM, no contractures. Normal muscle tone.  Skin: no rashes, lesions, ulcers. No induration Neurologic: CN 2-12 grossly intact. Sensation intact, DTR normal. Strength 5/5 in all 4.  Psychiatric: Normal judgment and insight. Alert and oriented x 3. Normal mood.    Labs on Admission: I have personally reviewed following labs and imaging studies  CBC: Recent Labs  Lab 10/16/17 1304  WBC 9.6  NEUTROABS 7.8*  HGB 11.2*  HCT 35.3*  MCV 81.1  PLT 244   Basic Metabolic Panel: Recent Labs  Lab 10/16/17 1304  NA 138  K 2.7*  CL 99*  CO2 20*  GLUCOSE 90  BUN 5*  CREATININE 0.38*  CALCIUM 6.4*   GFR: Estimated Creatinine Clearance: 93.1 mL/min (A) (by C-G formula based on SCr of 0.38 mg/dL (L)). Liver Function Tests: Recent Labs  Lab 10/16/17 1304  AST 28  ALT 25  ALKPHOS 112  BILITOT 1.5*  PROT 5.7*  ALBUMIN 2.9*   No results for input(s): LIPASE, AMYLASE in the last 168 hours. No results for  input(s): AMMONIA in the last 168 hours. Coagulation Profile: Recent Labs  Lab 10/16/17 1304  INR 1.44   Cardiac Enzymes: No results for input(s): CKTOTAL, CKMB, CKMBINDEX, TROPONINI in the last 168 hours. BNP (last 3 results) No results for input(s): PROBNP in the last 8760 hours. HbA1C: No results for input(s): HGBA1C in the last 72 hours. CBG: No results for input(s): GLUCAP in the last 168 hours. Lipid Profile: No results for input(s): CHOL, HDL, LDLCALC, TRIG, CHOLHDL, LDLDIRECT in the last 72 hours. Thyroid Function Tests: No results for input(s): TSH, T4TOTAL, FREET4, T3FREE, THYROIDAB in the last 72 hours. Anemia Panel: No results for input(s): VITAMINB12, FOLATE, FERRITIN, TIBC, IRON, RETICCTPCT in the last 72 hours. Urine analysis:    Component Value Date/Time   COLORURINE YELLOW 10/16/2017 1410   APPEARANCEUR CLOUDY (A) 10/16/2017 1410   LABSPEC 1.023 10/16/2017 1410   PHURINE 5.0 10/16/2017 1410   GLUCOSEU NEGATIVE 10/16/2017 1410   HGBUR LARGE (A) 10/16/2017 1410   BILIRUBINUR NEGATIVE 10/16/2017 1410   KETONESUR 80 (A) 10/16/2017 1410   PROTEINUR 100 (A) 10/16/2017 1410   UROBILINOGEN 1.0 07/06/2017 1029   NITRITE NEGATIVE 10/16/2017 1410   LEUKOCYTESUR LARGE (A) 10/16/2017 1410   Sepsis Labs: !!!!!!!!!!!!!!!!!!!!!!!!!!!!!!!!!!!!!!!!!!!! @LABRCNTIP (procalcitonin:4,lacticidven:4) )No results found for this or any previous visit (from the past 240 hour(s)).   Radiological Exams on Admission: US Renal  Result Date: 10/16/2017 CLINICAL DATA:  Blood draining from the nephrostomy tube EXAM: RENAL / URINARY TRACT ULTRASOUND COMPLETE COMPARISON:  Fluoro spot radiographs of October 04, 2017 revealing a right nephrostomy and contrast in the right ureter. FINDINGS: Right Kidney: Length: 10.4 cm. The nephrostomy tube's pigtail can be seen within the mid renal collecting system. There is no hydronephrosis. No perinephric fluid collection is observed. The cortical  echotexture is remains lower than that of the adjacent liver. Left Kidney: Length: 11.5 cm. Echogenicity within normal limits. No mass or hydronephrosis visualized. Bladder: The urinary bladder is decompressed by a Foley catheter. IMPRESSION: No etiology for the bloody nephrostomy tube drainage is observed. The proximal pigtail of the nephrostomy tube appears to lie in the mid renal collecting system. No hydronephrosis on the right or left. Electronically Signed   By: David  Martinique M.D.   On: 10/16/2017 13:54    EKG: Independently reviewed. Sinus tach  Assessment/Plan Active Problems:   Gross hematuria   Hypocalcemia   Severe protein-calorie malnutrition (HCC)   Hypokalemia   1. Sinus tach 1. Suspect secondary to presenting much  light abnormalities 2. Will replace potassium and calcium 3. Check magnesium level, correct as needed 4. Continue the patient on as needed labetalol 2. Hypocalcemia 1. Patient with history of persistently low calcium 2. Patient given 1 g of calcium gluconate in the emergency department 3. P calcium level in the morning, continue to replace as needed 3. Hypokalemia 1. Potassium 2.7 and ED with 10 mEq of potassium given by ED physician 2. Patient will likely require more potassium, will order 3. Repeat basic metabolic panel morning, correct electrodes as needed 4. History of B-cell lymphoma 1. He should followed by Dr. [name] 2. Undergoing chemotherapy as an outpatient 5. Pulmonary emboli 1. Patient on therapeutic anticoagulation prior to his hospital admission for recently diagnosed pulmonary emboli. 2. Given presenting hematuria, will hold further anticoagulation now. 3. We will have to readdress risk/benefit of therapeutic anticoagulation 6. Objective uropathy with nephrostomy tube, hematuria 1. Case was discussed with urology by the ED physician.  No signs of obstruction. 2. Will continue to monitor closely 3. Therapeutic anticoagulation on hold as per  above for presenting hematuria 4. At this time, urine does appear to be clearing. 5. Repeat CBC in the morning  DVT prophylaxis: SCD's Code Status: DNR, confirmed with patient in room.  Initially, patient's wishes were for no intubation but chest compressions were okay.  On further conversation, patient was made aware that chest compressions alone would most certainly be futile and result in additional trauma.  Patient subsequently confirmed DNR status. Family Communication: Pt in room  Disposition Plan: Uncertain at this time  Consults called:  Admission status: Inpatient, as would likely require greater than 2 midnight stay to correct electrolytes and to stabilize hematuria.  CHIU, Orpah Melter MD [facility] Pager [phone]  If 7PM-7AM, please contact night-coverage [email or web address] Password [email or web address]  10/16/2017, 3:41 PM

## 2017-10-16 NOTE — ED Notes (Signed)
Bed: WA07 Expected date:  Expected time:  Means of arrival:  Comments: 56 yo m blood in nephrostomy tube

## 2017-10-17 DIAGNOSIS — I2699 Other pulmonary embolism without acute cor pulmonale: Secondary | ICD-10-CM

## 2017-10-17 DIAGNOSIS — C8338 Diffuse large B-cell lymphoma, lymph nodes of multiple sites: Secondary | ICD-10-CM

## 2017-10-17 DIAGNOSIS — R000 Tachycardia, unspecified: Secondary | ICD-10-CM

## 2017-10-17 DIAGNOSIS — E876 Hypokalemia: Secondary | ICD-10-CM

## 2017-10-17 DIAGNOSIS — R310 Gross hematuria: Secondary | ICD-10-CM

## 2017-10-17 DIAGNOSIS — R630 Anorexia: Secondary | ICD-10-CM

## 2017-10-17 DIAGNOSIS — E43 Unspecified severe protein-calorie malnutrition: Secondary | ICD-10-CM

## 2017-10-17 LAB — COMPREHENSIVE METABOLIC PANEL
ALK PHOS: 94 U/L (ref 38–126)
ALT: 21 U/L (ref 17–63)
ANION GAP: 12 (ref 5–15)
AST: 21 U/L (ref 15–41)
Albumin: 2.6 g/dL — ABNORMAL LOW (ref 3.5–5.0)
BILIRUBIN TOTAL: 1.4 mg/dL — AB (ref 0.3–1.2)
BUN: <5 mg/dL — ABNORMAL LOW (ref 6–20)
CALCIUM: 6 mg/dL — AB (ref 8.9–10.3)
CO2: 21 mmol/L — ABNORMAL LOW (ref 22–32)
Chloride: 107 mmol/L (ref 101–111)
Creatinine, Ser: 0.39 mg/dL — ABNORMAL LOW (ref 0.61–1.24)
Glucose, Bld: 73 mg/dL (ref 65–99)
Potassium: 2.5 mmol/L — CL (ref 3.5–5.1)
Sodium: 140 mmol/L (ref 135–145)
TOTAL PROTEIN: 4.8 g/dL — AB (ref 6.5–8.1)

## 2017-10-17 LAB — C DIFFICILE QUICK SCREEN W PCR REFLEX
C DIFFICILE (CDIFF) INTERP: NOT DETECTED
C DIFFICILE (CDIFF) TOXIN: NEGATIVE
C DIFFICLE (CDIFF) ANTIGEN: NEGATIVE

## 2017-10-17 LAB — CBC
HCT: 30.7 % — ABNORMAL LOW (ref 39.0–52.0)
HEMOGLOBIN: 9.6 g/dL — AB (ref 13.0–17.0)
MCH: 25.6 pg — ABNORMAL LOW (ref 26.0–34.0)
MCHC: 31.3 g/dL (ref 30.0–36.0)
MCV: 81.9 fL (ref 78.0–100.0)
Platelets: 276 10*3/uL (ref 150–400)
RBC: 3.75 MIL/uL — AB (ref 4.22–5.81)
RDW: 20.7 % — ABNORMAL HIGH (ref 11.5–15.5)
WBC: 6 10*3/uL (ref 4.0–10.5)

## 2017-10-17 LAB — MAGNESIUM: MAGNESIUM: 2 mg/dL (ref 1.7–2.4)

## 2017-10-17 MED ORDER — POTASSIUM & SODIUM PHOSPHATES 280-160-250 MG PO PACK
1.0000 | PACK | Freq: Three times a day (TID) | ORAL | Status: DC
  Administered 2017-10-17 – 2017-11-14 (×106): 1 via ORAL
  Filled 2017-10-17 (×124): qty 1

## 2017-10-17 MED ORDER — POTASSIUM CHLORIDE 10 MEQ/100ML IV SOLN
10.0000 meq | INTRAVENOUS | Status: AC
  Administered 2017-10-17 (×4): 10 meq via INTRAVENOUS
  Filled 2017-10-17 (×4): qty 100

## 2017-10-17 MED ORDER — POTASSIUM CHLORIDE CRYS ER 20 MEQ PO TBCR
40.0000 meq | EXTENDED_RELEASE_TABLET | Freq: Once | ORAL | Status: DC
  Filled 2017-10-17: qty 2

## 2017-10-17 MED ORDER — SULFAMETHOXAZOLE-TRIMETHOPRIM 400-80 MG PO TABS
2.0000 | ORAL_TABLET | ORAL | Status: DC
  Administered 2017-10-22 – 2017-11-14 (×11): 2 via ORAL
  Filled 2017-10-17 (×13): qty 2

## 2017-10-17 MED ORDER — CALCIUM CARBONATE ANTACID 500 MG PO CHEW
1000.0000 mg | CHEWABLE_TABLET | Freq: Three times a day (TID) | ORAL | Status: DC
  Administered 2017-10-17 – 2017-10-23 (×11): 1000 mg via ORAL
  Administered 2017-10-25: 500 mg via ORAL
  Administered 2017-10-26 – 2017-11-12 (×13): 1000 mg via ORAL
  Filled 2017-10-17 (×26): qty 5

## 2017-10-17 NOTE — Progress Notes (Signed)
PROGRESS NOTE    [name]  ZHG:[id number] DOB: 07-31-1961 DOA: 10/16/2017 PCP: [name], [name]   Brief Narrative:  Cory Ibarra is a 56 y.o. 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  Of note, patient was recently diagnosed with pulmonary embolism and has been continued on Eliquis prior to admission.  During workup, patient was also noted to be tachycardic and was subsequently referred to the emergency department for further workup.  ED Course: In the emergency department, patient was noted to have heart rate in the low 120s.  Potassium was noted to be 2.7 with calcium of 6.4.  Most recent magnesium was noted to be 1.6.  Patient was given 2 g of magnesium in the emergency department.  Repeat magnesium level is pending.  Even electrolyte abnormalities, hospitalist service was consulted for consideration for admission     Assessment & Plan:   Active Problems:   Gross hematuria   Hypocalcemia   Severe protein-calorie malnutrition (HCC)   Hypokalemia   Sinus tach -Potassium and calcium levels worse than yesterday -Restart home medications of calcium supplementation - IV potassium and calcium supplementation -Redraw electrolyte panel in the evening - Continue telemetry -Magnesium level 2.0 - Continue  labetalol   Hypocalcemia - Patient with history of persistently low calcium - Patient given 1 g of calcium gluconate in the emergency department - Restart patient home p.o. medications to replete calcium -Patient received another dose of calcium gluconate IV this morning  Hypokalemia - Potassium 2.7 and ED with 10 mEq of potassium given by ED physician - Potassium of 2.5 this morning -Continue IV and p.o. replacement -Recheck electrolyte panel this  afternoon  History of B-cell lymphoma - He should followed by Dr. [name] - Undergoing chemotherapy as an outpatient  Pulmonary emboli - Patient on therapeutic anticoagulation prior to his hospital admission for recently diagnosed pulmonary emboli. - Given presenting hematuria, will hold further anticoagulation now. -Per nursing staff and case manager/social worker patient wishes to discharge with palliative care - As patient may discharge with palliative care will need to further discuss role of anticoagulation  Objective uropathy with nephrostomy tube, hematuria - Case was discussed with urology by the ED physician.  No signs of obstruction. - Will continue to monitor closely - Therapeutic anticoagulation on hold as per above for presenting hematuria -Urine clear this morning - H&H appears to be stable from 9 days prior      DVT prophylaxis: SCDs Code Status: DNR Family Communication: No family bedside Disposition Plan: Pending improvement in electrolytes and stabilization of hemoglobin and hematocrit or discharge with palliative care services   Consultants:   Urology phone call by emergency department physician  Palliative care  Procedures:   None  Antimicrobials:   None   Subjective: Patient seen and examined on bedside rounds.  He voices that he is willing to try p.o. supplementation of calcium as well as transition from Bactrim suspension to Bactrim pills p.o.  Voices he would like to ultimately go home once he is well enough to do so.  Per nursing staff patient has verbalized that he would like to be discharged with palliative care services.  Patient denies any new symptoms.  Objective: Vitals:   10/16/17 1857 10/16/17 2206 10/17/17 0458 10/17/17 0830  BP:  107/68 103/62 101/63  Pulse:  95 93 95  Resp:  18 18 15   Temp:  98 F (36.7 C) 97.6 F (36.4 C) 98 F (36.7 C)  TempSrc:  Oral Oral Oral  SpO2: 100% 100% 98% 100%  Weight:      Height:         Intake/Output Summary (Last 24 hours) at 10/17/2017 0847 Last data filed at 10/17/2017 0700 Gross per 24 hour  Intake 650 ml  Output 400 ml  Net 250 ml   Filed Weights   10/16/17 1845  Weight: 60.3 kg (132 lb 15 oz)    Examination:  General exam: Appears calm and comfortable  Respiratory system: Clear to auscultation. Respiratory effort normal. Cardiovascular system: S1 & S2 heard, RRR. No JVD, murmurs, rubs, gallops or clicks. No pedal edema. Gastrointestinal system: Abdomen is nondistended, soft and nontender. No organomegaly or masses felt. Normal bowel sounds heard. Central nervous system: Alert and oriented. No focal neurological deficits. Extremities: Symmetric 5 x 5 power. Skin: No rashes, lesions or ulcers Psychiatry: Judgement and insight appear normal. Mood & affect appropriate.     Data Reviewed: I have personally reviewed following labs and imaging studies  CBC: Recent Labs  Lab 10/16/17 1304 10/17/17 0415  WBC 9.6 6.0  NEUTROABS 7.8*  --   HGB 11.2* 9.6*  HCT 35.3* 30.7*  MCV 81.1 81.9  PLT 378 161   Basic Metabolic Panel: Recent Labs  Lab 10/16/17 1304 10/16/17 1934 10/17/17 0415  NA 138  --  140  K 2.7*  --  2.5*  CL 99*  --  107  CO2 20*  --  21*  GLUCOSE 90  --  73  BUN 5*  --  <5*  CREATININE 0.38*  --  0.39*  CALCIUM 6.4*  --  6.0*  MG  --  2.2 2.0   GFR: Estimated Creatinine Clearance: 85.8 mL/min (A) (by C-G formula based on SCr of 0.39 mg/dL (L)). Liver Function Tests: Recent Labs  Lab 10/16/17 1304 10/17/17 0415  AST 28 21  ALT 25 21  ALKPHOS 112 94  BILITOT 1.5* 1.4*  PROT 5.7* 4.8*  ALBUMIN 2.9* 2.6*   No results for input(s): LIPASE, AMYLASE in the last 168 hours. No results for input(s): AMMONIA in the last 168 hours. Coagulation Profile: Recent Labs  Lab 10/16/17 1304  INR 1.44   Cardiac Enzymes: No results for input(s): CKTOTAL, CKMB, CKMBINDEX, TROPONINI in the last 168 hours. BNP (last 3 results) No  results for input(s): PROBNP in the last 8760 hours. HbA1C: No results for input(s): HGBA1C in the last 72 hours. CBG: No results for input(s): GLUCAP in the last 168 hours. Lipid Profile: No results for input(s): CHOL, HDL, LDLCALC, TRIG, CHOLHDL, LDLDIRECT in the last 72 hours. Thyroid Function Tests: Recent Labs    10/16/17 1934  TSH 2.260   Anemia Panel: No results for input(s): VITAMINB12, FOLATE, FERRITIN, TIBC, IRON, RETICCTPCT in the last 72 hours. Sepsis Labs: No results for input(s): PROCALCITON, LATICACIDVEN in the last 168 hours.  No results found for this or any previous visit (from the past 240 hour(s)).       Radiology Studies: US Renal  Result Date: 10/16/2017 CLINICAL DATA:  Blood draining from the nephrostomy tube EXAM: RENAL / URINARY TRACT ULTRASOUND COMPLETE COMPARISON:  Fluoro spot radiographs of October 04, 2017 revealing a right nephrostomy and contrast in the right ureter. FINDINGS: Right Kidney: Length: 10.4 cm. The nephrostomy tube's pigtail can be seen within the mid renal collecting system. There is no hydronephrosis.  No perinephric fluid collection is observed. The cortical echotexture is remains lower than that of the adjacent liver. Left Kidney: Length: 11.5 cm. Echogenicity within normal limits. No mass or hydronephrosis visualized. Bladder: The urinary bladder is decompressed by a Foley catheter. IMPRESSION: No etiology for the bloody nephrostomy tube drainage is observed. The proximal pigtail of the nephrostomy tube appears to lie in the mid renal collecting system. No hydronephrosis on the right or left. Electronically Signed   By: David  Martinique M.D.   On: 10/16/2017 13:54        Scheduled Meds: . dronabinol  5 mg Oral QAC lunch  . magnesium oxide  400 mg Oral BID  . OLANZapine zydis  2.5 mg Oral QHS  . oxyCODONE  10 mg Oral Q12H  . pantoprazole  40 mg Oral Q lunch  . potassium chloride  40 mEq Oral Once  . predniSONE  20 mg Oral Q  breakfast  . sulfamethoxazole-trimethoprim  20 mL Oral Q M,W,F   Continuous Infusions: . sodium chloride 75 mL/hr at 10/16/17 1713  . potassium chloride 10 mEq (10/17/17 0814)     LOS: 1 day    Time spent: 35 minutes    Loretha Stapler, MD [facility] Pager [phone]  If 7PM-7AM, please contact night-coverage [email or web address] Password [facility] 10/17/2017, 8:47 AM

## 2017-10-17 NOTE — Clinical Social Work Note (Signed)
Clinical Social Work Assessment  Patient Details  Name: Cory Ibarra MRN: [id number] Date of Birth: [birth year]  Date of referral:  10/17/17               Reason for consult:  Discharge Planning                Permission sought to share information with:  Case Manager, Facility Sport and exercise psychologist, Family Supports Permission granted to share information::  Yes, Verbal Permission Granted  Name::     Cory Ibarra  Agency::  [facility]  Relationship::  Brother  Sport and exercise psychologist Information:     Housing/Transportation Living arrangements for the past 2 months:  [place] of Information:  Patient, Medical Team Patient Interpreter Needed:  None Criminal Activity/Legal Involvement Pertinent to Current Situation/Hospitalization:  No - Comment as needed Significant Relationships:  Warehouse manager, Other Family Members Lives with:  Facility Resident Do you feel safe going back to the place where you live?  No Need for family participation in patient care:  Yes (Comment)  Care giving concerns:  No care giving concerns at the time off assessment. Patient expressed concerns regarding his care at the facility and his recent hospital admissions since being at the facility.    Social Worker assessment / plan:  LCSW following for return to facility.   Patient is from [facility] Patient has been at the facility a little over a month and has had multiple hospital stays since being in the facility.   Patient was most recently admitted for Hematuria. 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  LCSW met with patient at bedside. No family present at the time of assessment. Patient expressed that he would like to go home with palliative. Patient stated that " I will get better care at home. Every time I go back (to facility) I get dehydrated and end up back in the hospital."     Patient reported that he has a brother that lives in the home with him, however he does work outside the home 8-10 hours a day.   PLAN: TBD, LCSW will continue to follow up with patient to determine disposition.    Employment status:  Disabled (Comment on whether or not currently receiving Disability) Insurance information:  Medicaid In [place] PT Recommendations:  Not assessed at this time Information / Referral to community resources:     Patient/Family's Response to care:  No family at bedside. Attempted to reach patients brother and POA, Cory Ibarra by phone. Patient seems to be responsive to care, but frustrated with frequent hospital admissions. Patient has been admitted 3 times in the past 30 days.    Patient/Family's Understanding of and Emotional Response to Diagnosis, Current Treatment, and Prognosis:  Patient is understanding of current diagnosis and treatment plan. Patient prefers to go home with palliative at DC.   Emotional Assessment Appearance:  Appears stated age Attitude/Demeanor/Rapport:    Affect (typically observed):  Accepting, Calm, Pleasant Orientation:  Oriented to Self, Oriented to Place, Oriented to  Time, Oriented to Situation Alcohol / Substance use:  Not Applicable Psych involvement (Current and /or in the community):  No (Comment)  Discharge Needs  Concerns to be addressed:  No discharge needs identified Readmission within the last 30 days:  Yes Current discharge risk:  None Barriers to Discharge:  Continued Medical Work up   [employer], LCSW 10/17/2017, 1:11 PM

## 2017-10-17 NOTE — Consult Note (Signed)
IP PROGRESS NOTE  Requesting Provider:  Dr Eber Jones  Subjective:  Cory Ibarra is a 56 y.o. 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  Patient is currently undergoing curative-intent chemotherapy.  The initial cycle of chemotherapy was complicated by septic shock in the setting of neutropenia due to obstructive uropathy.  Subsequently, course was complicated by development of progressive debility with loss of appetite, extreme malnutrition, and decreased performance status.  Second cycle of chemotherapy was administered with significant dose reduction, but still resulted in recurrent hospitalizations first time with pneumonia, pulmonary embolism and now with hematuria from nephrostomy as well as remarkable electrolyte abnormalities despite oral supplementation.  After a short hold of apixaban, hematuria appears to have resolved.  Patient is back to his normal current baseline.  Denies any active pain.  Respiratory complaints.  Continues to have significant electrolyte abnormalities with hypocalcemia, hypophosphatemia, hypomagnesemia, and hypokalemia.  Objective: Vital signs in last 24 hours: Blood pressure 103/66, pulse 92, temperature 98.2 F (36.8 C), temperature source Oral, resp. rate 18, height 6\' 2"  (1.88 m), weight 132 lb 15 oz (60.3 kg), SpO2 100 %.  Intake/Output from previous day: 11/13 0701 - 11/14 0700 In: 650 [I.V.:650] Out: 400 [Urine:400]  Physical Exam:  Patient is alert, awake, oriented x3.  No acute distress. HEENT: Muscle wasting, no oral mucosal lesions. Lungs: Clear to auscultation bilaterally Cardiac: S1/S2, regular, no murmurs Abdomen: Soft, nontender nondistended.  Bowel sounds are normoactive. Extremities: No lower extremity edema Portacath/PICC-without erythema.  Right nephrostomy in place draining clear yellow urine.  Lab  Results: Recent Labs    10/16/17 1304 10/17/17 0415  WBC 9.6 6.0  HGB 11.2* 9.6*  HCT 35.3* 30.7*  PLT 378 276    BMET Recent Labs    10/16/17 1304 10/17/17 0415  NA 138 140  K 2.7* 2.5*  CL 99* 107  CO2 20* 21*  GLUCOSE 90 73  BUN 5* <5*  CREATININE 0.38* 0.39*  CALCIUM 6.4* 6.0*    Lab Results  Component Value Date   CEA1 0.7 06/28/2017    Studies/Results: US Renal  Result Date: 10/16/2017 CLINICAL DATA:  Blood draining from the nephrostomy tube EXAM: RENAL / URINARY TRACT ULTRASOUND COMPLETE COMPARISON:  Fluoro spot radiographs of October 04, 2017 revealing a right nephrostomy and contrast in the right ureter. FINDINGS: Right Kidney: Length: 10.4 cm. The nephrostomy tube's pigtail can be seen within the mid renal collecting system. There is no hydronephrosis. No perinephric fluid collection is observed. The cortical echotexture is remains lower than that of the adjacent liver. Left Kidney: Length: 11.5 cm. Echogenicity within normal limits. No mass or hydronephrosis visualized. Bladder: The urinary bladder is decompressed by a Foley catheter. IMPRESSION: No etiology for the bloody nephrostomy tube drainage is observed. The proximal pigtail of the nephrostomy tube appears to lie in the mid renal collecting system. No hydronephrosis on the right or left. Electronically Signed   By: David  Martinique M.D.   On: 10/16/2017 13:54    Medications: I have reviewed the patient's current medications.  Assessment/Plan: 56 y.o. 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  Severe calorie/protein malnutrition and remarkable and persistent electrolyte abnormalities.  Overall, patient continues to require  significant amount  of medical care.  He has recurrent hospitalizations for the previously described reasons.  Today, I have discussed the difficult time that we are having keeping him out of the hospital as well as the high risk of continuation of systemic therapy for his malignancy.  It is hard to withhold therapy from potentially curable cancer, and patient is insistent on continuing attempting to treat him with systemic chemotherapy.  **Lymphoma: We will reassess disease with CT of the chest/abdomen/pelvis and bone scan in lieu of obtaining PET/CT as patient remains hospitalized at this time.  If disease is responding to therapy, we do have better reason to continue attempting treatments.  If there is progressive disease, patient needs to be transitioned to supportive care at this time as he is unlikely to tolerate the second lines of chemotherapy for the lymphoma indication.. --Please obtain CT of the neck/chest/abdomen/pelvis and bone scan for restaging purposes when deemed appropriate  **Electrolytes/Nutrition: Severe mixed calorie/protein malnutrition with a good portion of it having to do with complete lack of appetite and effort on the patient's behalf to improve his nutrition.  I have discussed alternative of enteral nutrition via nasogastric or percutaneous gastric tube versus parenteral nutrition through the vein.  In the context of recurrent infections, parenteral nutrition will have significant risk of causing severe infectious complications including fungal infections and I do not advocate for total parenteral nutrition at this time.  Patient is very apprehensive about tube feeds and promises to make himself eat more at this time. --Recommend continued follow-up by nutrition services with calorie counts --Continue electrolyte replacement therapy by IV or by mouth as needed based on his daily labs.  **Disposition: Discharged from the hospital discretion of the primary treating service.   Whether patient returns back to skilled nursing facility or home will depend on opinion of physical/occupational therapy and patient's wishes.   LOS: 1 day   Ardath Sax, MD   10/17/2017, 5:57 PM

## 2017-10-17 NOTE — Consult Note (Signed)
Consultation Note Date: 10/17/2017   Patient Name: Cory Ibarra  DOB: May 19, 1961  MRN: [id number]  Age / Sex: 56 y.o., male  PCP: [name], [name] Referring Physician: Eber Jones, MD  Reason for Consultation: to discuss disposition options in light of patient's current condition.   HPI/Patient Profile: 56 y.o. 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    Patient is admitted to hospital medicine service with obstructive uropathy with nephrostomy tube, hematuria, also with ongoing electrolyte imbalances such as hypokalemia and hypocalcemia.   The patient has been admitted this time from a SNF rehab, he was living at home with his brother prior to that, he has had recent hospitalizations.   A palliative consult has been requested for discussions pertaining to appropriate disposition options. PMT has briefly met with the patient in previous hospitalization.   The patient is awake alert, resting in bed, he states, " I have cancer in my chest that they treated with radiation, I have cancer in my bones, I see Cory Ibarra at the cancer center, he is going to start 3rd cycle of chemotherapy soon. I don't think I'm getting much benefit form being at rehab, I'd rather be home but I need some help."  We reviewed hospice versus palliative philosophy of care, as per patient's request, all of his questions answered to the best of my ability, see recommendations below, thank you for the consult.    HCPOA  brother Cory Ibarra He has 2 brothers and 2 sisters No  spouse No children   SUMMARY OF RECOMMENDATIONS   Recommend case manager consult to discuss with patient and his brother about home with home health care options, patient also asks for a list of private duty sitters he may possibly be able to hire. His goal is to go home, lives with brother. He states he is on curative treatments for his cancer, he is to start chemotherapy soon. He would also benefit from home based palliative care on discharge as well.  No additional palliative recommendations at this time. I have discussed with him about differences between hospice and palliative care, he is well aware.    Code Status/Advance Care Planning:  DNR    Symptom Management:    continue current mode of care  Palliative Prophylaxis:   Bowel Regimen  Psycho-social/Spiritual:   Desire for further Chaplaincy support:no  Additional Recommendations: Caregiving  Support/Resources  Prognosis:   Unable to determine  Discharge Planning: To Be Determined      Primary Diagnoses: Present on Admission: . Severe protein-calorie malnutrition ([place]) . Gross hematuria . Hypokalemia . Hypocalcemia   I have reviewed the medical record, interviewed the patient and family, and examined the patient. The following aspects are pertinent.  Past Medical  History:  Diagnosis Date  . Cancer ([place])   . Tendonitis    Social History   Socioeconomic History  . Marital status: Single    Spouse name: None  . Number of children: 0  . Years of education: None  . Highest education level: None  Social Needs  . Financial resource strain: None  . Food insecurity - worry: None  . Food insecurity - inability: None  . Transportation needs - medical: None  . Transportation needs - non-medical: None  Occupational History  . None  Tobacco Use  . Smoking status: Never Smoker  . Smokeless tobacco: Never Used  Substance and Sexual Activity  . Alcohol use: Yes    Comment: occasionally/socially  . Drug  use: No  . Sexual activity: No  Other Topics Concern  . 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 Never used smokeless tobacco.   No e-cigarettes   Occasional alcohol consumption socially.   Single, no children.   Family History  Problem Relation Age of Onset  . Cancer Father    Scheduled Meds: . calcium carbonate  1,000 mg Oral TID  . dronabinol  5 mg Oral QAC lunch  . magnesium oxide  400 mg Oral BID  . OLANZapine zydis  2.5 mg Oral QHS  . oxyCODONE  10 mg Oral Q12H  . pantoprazole  40 mg Oral Q lunch  . potassium & sodium phosphates  1 packet Oral TID AC & HS  . potassium chloride  40 mEq Oral Once  . predniSONE  20 mg Oral Q breakfast  . sulfamethoxazole-trimethoprim  2 tablet Oral Once per day on Mon Wed Fri   Continuous Infusions: . sodium chloride 75 mL/hr at 10/16/17 1713   PRN Meds:.acetaminophen **OR** acetaminophen, ketorolac, labetalol, LORazepam, oxyCODONE, sodium chloride flush Medications Prior to Admission:  Prior to Admission medications   Medication Sig Start Date End Date Taking? Authorizing Provider  apixaban (ELIQUIS) 5 MG TABS tablet Take 2 tabs po BID x7 days, then take 1 tab po BID 10/08/17  Yes Patrecia Pour, MD  calcium elemental as carbonate (BARIATRIC TUMS ULTRA) 400 MG chewable tablet Chew 3 tablets (1,200 mg total) 3 (three) times daily by mouth. 10/08/17  Yes Patrecia Pour, MD  chlorhexidine (PERIDEX) 0.12 % solution 15 mLs by Mouth Rinse route 2 (two) times daily. 09/03/17  Yes Theodis Blaze, MD  Cholecalciferol 50000 units TABS Take 50,000 Units once a week by mouth. for 8 weeks, then return for level recheck Patient taking differently: Take 50,000 Units every Monday by mouth. for 8 weeks, then return for level recheck 10/08/17  Yes Patrecia Pour, MD  dronabinol (MARINOL) 5 MG capsule Take 1 capsule (5 mg total) daily before lunch by mouth. 10/11/17  Yes Gerlene Fee, NP    Lidocaine-Prilocaine, Bulk, 2.5-2.5 % CREA Apply to affected areas topically one time daily   Yes [provider]  LORazepam (ATIVAN) 0.5 MG tablet Take 1 tablet (0.5 mg total) every 6 (six) hours as needed for up to 14 days by mouth (Nausea or vomiting). 10/11/17 10/25/17 Yes Gerlene Fee, NP  magnesium oxide (MAG-OX) 400 (241.3 Mg) MG tablet Take 1 tablet (400 mg total) by mouth 2 (two) times daily. 09/25/17 10/25/17 Yes Ardath Sax, MD  Multiple Vitamin (MULTIVITAMIN WITH MINERALS) TABS tablet Take 1 tablet by mouth daily. 09/25/17  Yes Ardath Sax, MD  OLANZapine zydis (  ZYPREXA) 5 MG disintegrating tablet Take 0.5 tablets (2.5 mg total) by mouth at bedtime. 09/03/17  Yes Theodis Blaze, MD  oxyCODONE (OXYCONTIN) 10 mg 12 hr tablet Take 1 tablet (10 mg total) every 12 (twelve) hours by mouth. 10/11/17  Yes Gerlene Fee, NP  Oxycodone HCl 10 MG TABS Take 10 mg every 4 (four) hours as needed by mouth (for pain).   Yes [provider]  pantoprazole (PROTONIX) 40 MG tablet Take 40 mg by mouth daily with lunch.    Yes [provider]  polyethylene glycol (MIRALAX / GLYCOLAX) packet Take 17 g daily as needed by mouth for moderate constipation.    Yes [provider]  potassium & sodium phosphates (PHOS-NAK) 280-160-250 MG PACK Take 1 packet by mouth 4 (four) times daily -  before meals and at bedtime. 09/25/17 10/25/17 Yes Perlov, Marinell Blight, MD  predniSONE (DELTASONE) 10 MG tablet Take 2 tablets (20 mg total) daily with breakfast by mouth. 10/08/17  Yes Patrecia Pour, MD  prochlorperazine (COMPAZINE) 10 MG tablet Take 10 mg every 6 (six) hours as needed by mouth for nausea or vomiting.   Yes [provider]  senna-docusate (SENOKOT-S) 8.6-50 MG tablet Take 1 tablet by mouth daily as needed for mild constipation.   Yes [provider]  sulfamethoxazole-trimethoprim (BACTRIM,SEPTRA) 200-40 MG/5ML suspension Take 20 mLs by mouth every  Monday, Wednesday, and Friday. Continuous   Yes [provider]  oxyCODONE (ROXICODONE) 5 MG immediate release tablet Take 1 tablet (5 mg total) every 4 (four) hours as needed by mouth for severe pain. Patient not taking: Reported on 10/16/2017 10/11/17   Gerlene Fee, NP   Allergies  Allergen Reactions  . Bee Venom Swelling   Review of Systems +mild generalized pain.   Physical Exam No acute distress Weak appearing young gentleman resting in bed Awake alert oriented, in good spirits S1 S2 Abdomen soft No edema Mood and affect appear to be within normal limits.   Vital Signs: BP 103/66 (BP Location: Left Arm)   Pulse 92   Temp 98.2 F (36.8 C) (Oral)   Resp 18   Ht 6' 2"  (1.88 m)   Wt 60.3 kg (132 lb 15 oz)   SpO2 100%   BMI 17.07 kg/m  Pain Assessment: 0-10   Pain Score: 2    SpO2: SpO2: 100 % O2 Device:SpO2: 100 % O2 Flow Rate: .   IO: Intake/output summary:   Intake/Output Summary (Last 24 hours) at 10/17/2017 1505 Last data filed at 10/17/2017 1400 Gross per 24 hour  Intake 650 ml  Output 725 ml  Net -75 ml    LBM:   Baseline Weight: Weight: 60.3 kg (132 lb 15 oz) Most recent weight: Weight: 60.3 kg (132 lb 15 oz)     Palliative Assessment/Data:   PPS 50%  Time In:  1400 Time Out:  1500 Time Total:  60 min  Greater than 50%  of this time was spent counseling and coordinating care related to the above assessment and plan.  Signed by: Loistine Chance, MD  [phone]  Please contact Palliative Medicine Team phone at [phone] for questions and concerns.  For individual provider: See Shea Evans

## 2017-10-17 NOTE — NC FL2 (Signed)
[place] LEVEL OF CARE SCREENING TOOL     IDENTIFICATION  Patient Name: Cory Ibarra Birthdate: 29-Oct-1961 Sex: male Admission Date (Current Location): 10/16/2017  [facility] and [place] Number:  Herbalist and Address:  [facility],  [address] [place], [place]      Provider Number: [id number]  Attending Physician Name and Address:  Eber , MD  Relative Name and Phone Number:       Current Level of Care: Hospital Recommended Level of Care: [place] Prior Approval Number:    Date Approved/Denied:   PASRR Number: [id number]  Discharge Plan: SNF    Current Diagnoses: Patient Active Problem List   Diagnosis Date Noted  . Pressure injury of skin 10/04/2017  . Dehydration   . Hypoxia   . Pulmonary emboli ([place]) 10/03/2017  . Healthcare-associated pneumonia   . Obstructive uropathy   . Liver function test abnormality   . Renal calculus or stone 09/19/2017  . Leakage of nephrostomy catheter ([place]) 09/19/2017  . Nephrostomy status ([place]) 09/19/2017  . Gastroesophageal reflux disease without esophagitis 09/19/2017  . Hypokalemia 09/18/2017  . Hypomagnesemia 09/18/2017  . Electrolyte abnormality   . Hypovolemia 09/17/2017  . Chemotherapy-induced nausea 09/17/2017  . Constipation 09/17/2017  . DLBCL (diffuse large B cell lymphoma) ([place]) 09/17/2017  . Hypocalcemia 09/17/2017  . Severe protein-calorie malnutrition ([place])   . Adjustment disorder with mixed anxiety and depressed mood   . Palliative care encounter   . Admission for antineoplastic chemotherapy   . Septic shock due to Escherichia coli ([place])   . Neutropenia ([place]) 08/21/2017  . Gross hematuria   . Lactic acidosis   . Neutropenic sepsis ([place])   . Hypophosphatemia 08/20/2017  . Diffuse large B cell lymphoma ([place]) 07/03/2017  . Bony metastasis ([place]) 06/29/2017  . Chest wall mass   . 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 Closed compression  fracture of L3 lumbar vertebra ([place]) 06/28/2017  . Severe back pain 06/28/2017  . Weight loss, unintentional 06/28/2017    Orientation RESPIRATION BLADDER Height & Weight     Self, Time, Situation, Place  Normal External catheter Weight: 132 lb 15 oz (60.3 kg) Height:  6\' 2"  (188 cm)  BEHAVIORAL SYMPTOMS/MOOD NEUROLOGICAL BOWEL NUTRITION STATUS      Incontinent, Continent Diet(See DC summary)  AMBULATORY STATUS COMMUNICATION OF NEEDS Skin   Extensive Assist Verbally Normal                       Personal Care Assistance Level of Assistance  Bathing, Feeding, Dressing Bathing Assistance: Limited assistance Feeding assistance: Independent Dressing Assistance: Limited assistance     Functional Limitations Info  Sight, Hearing, Speech Sight Info: Adequate Hearing Info: Adequate Speech Info: Adequate    SPECIAL CARE FACTORS FREQUENCY  PT (By licensed PT), OT (By licensed OT)                    Contractures Contractures Info: Not present    Additional Factors Info  Code Status, Allergies, Isolation Precautions Code Status Info: DNR Allergies Info: Bee Venom     Isolation Precautions Info: MRSA     Current Medications (10/17/2017):  This is the current hospital active medication list Current Facility-Administered Medications  Medication Dose Route Frequency Provider Last Rate Last Dose  . 0.9 %  sodium chloride infusion   Intravenous Continuous Donne Hazel, MD 75 mL/hr at 10/16/17 1713    . acetaminophen (TYLENOL) tablet  650 mg  650 mg Oral Q6H PRN Donne Hazel, MD       Or  . acetaminophen (TYLENOL) suppository 650 mg  650 mg Rectal Q6H PRN Donne Hazel, MD      . calcium carbonate (TUMS - dosed in mg elemental calcium) chewable tablet 1,000 mg  1,000 mg Oral TID Eber Shantinique Picazo, MD   1,000 mg at 10/17/17 1155  . dronabinol (MARINOL) capsule 5 mg  5 mg Oral QAC lunch Donne Hazel, MD   5 mg at 10/17/17 1154  . ketorolac (TORADOL) 30 MG/ML  injection 30 mg  30 mg Intravenous Q6H PRN Donne Hazel, MD      . labetalol (NORMODYNE,TRANDATE) injection 5 mg  5 mg Intravenous Q2H PRN Donne Hazel, MD      . LORazepam (ATIVAN) tablet 0.5 mg  0.5 mg Oral Q6H PRN Donne Hazel, MD      . magnesium oxide (MAG-OX) tablet 400 mg  400 mg Oral BID Donne Hazel, MD   400 mg at 10/17/17 0916  . OLANZapine zydis (ZYPREXA) disintegrating tablet 2.5 mg  2.5 mg Oral QHS Donne Hazel, MD   2.5 mg at 10/16/17 2300  . oxyCODONE (Oxy IR/ROXICODONE) immediate release tablet 10 mg  10 mg Oral Q4H PRN Donne Hazel, MD   10 mg at 10/16/17 1635  . oxyCODONE (OXYCONTIN) 12 hr tablet 10 mg  10 mg Oral Q12H Donne Hazel, MD   10 mg at 10/17/17 0916  . pantoprazole (PROTONIX) EC tablet 40 mg  40 mg Oral Q lunch Donne Hazel, MD   40 mg at 10/17/17 1154  . potassium & sodium phosphates (PHOS-NAK) 280-160-250 MG packet 1 packet  1 packet Oral TID AC & HS Eber Keyle Doby, MD   1 packet at 10/17/17 1156  . potassium chloride SA (K-DUR,KLOR-CON) CR tablet 40 mEq  40 mEq Oral Once Rise Patience, MD      . predniSONE (DELTASONE) tablet 20 mg  20 mg Oral Q breakfast Donne Hazel, MD   20 mg at 10/17/17 0916  . sodium chloride flush (NS) 0.9 % injection 10-40 mL  10-40 mL Intracatheter PRN Donne Hazel, MD      . sulfamethoxazole-trimethoprim (BACTRIM,SEPTRA) 400-80 MG per tablet 2 tablet  2 tablet Oral Once per day on Mon Wed Fri Eber Zayli Villafuerte, MD       Facility-Administered Medications Ordered in Other Encounters  Medication Dose Route Frequency Provider Last Rate Last Dose  . sodium chloride flush (NS) 0.9 % injection 10 mL  10 mL Intracatheter PRN Ardath Sax, MD   10 mL at 08/09/17 1702     Discharge Medications: Please see discharge summary for a list of discharge medications.  Relevant Imaging Results:  Relevant Lab Results:   Additional Information SS#: [id number]  Servando Snare, LCSW

## 2017-10-17 NOTE — Progress Notes (Signed)
MD notified of loose stools x3. Pt c/o abdominal pain when he gets the urge to have BM.

## 2017-10-17 NOTE — Care Management Note (Signed)
Case Management Note  Patient Details  Name: Cory Ibarra MRN: [id number] Date of Birth: 1961/05/14  Subjective/Objective:                  hypokalemia  Action/Plan: Date: October 17, 2017 Velva Harman, BSN, [place], [place] Chart and notes review for patient progress and needs. Will follow for case management and discharge needs. Next review date: [id number]  Expected Discharge Date:  (UNKNOWN)               Expected Discharge Plan:  [place]  In-House Referral:  Clinical Social Work  Discharge planning Services  CM Consult  Post Acute Care Choice:    Choice offered to:     DME Arranged:    DME Agency:     HH Arranged:    [place] Agency:     Status of Service:  In process, will continue to follow  If discussed at Long Length of Stay Meetings, dates discussed:    Additional Comments:  Leeroy Cha, RN 10/17/2017, 8:34 AM

## 2017-10-18 LAB — COMPREHENSIVE METABOLIC PANEL
ALK PHOS: 89 U/L (ref 38–126)
ALT: 17 U/L (ref 17–63)
ALT: 17 U/L (ref 17–63)
ANION GAP: 12 (ref 5–15)
AST: 20 U/L (ref 15–41)
AST: 19 U/L (ref 15–41)
Albumin: 2.4 g/dL — ABNORMAL LOW (ref 3.5–5.0)
Albumin: 2.5 g/dL — ABNORMAL LOW (ref 3.5–5.0)
Alkaline Phosphatase: 89 U/L (ref 38–126)
Anion gap: 13 (ref 5–15)
BUN: <5 mg/dL — ABNORMAL LOW (ref 6–20)
CALCIUM: 5.8 mg/dL — AB (ref 8.9–10.3)
CHLORIDE: 105 mmol/L (ref 101–111)
CO2: 19 mmol/L — AB (ref 22–32)
CO2: 19 mmol/L — AB (ref 22–32)
CREATININE: 0.33 mg/dL — AB (ref 0.61–1.24)
Calcium: 5.6 mg/dL — CL (ref 8.9–10.3)
Chloride: 104 mmol/L (ref 101–111)
Creatinine, Ser: 0.37 mg/dL — ABNORMAL LOW (ref 0.61–1.24)
GFR calc non Af Amer: >60 mL/min (ref >60)
GFR calc non Af Amer: >60 mL/min (ref >60)
Glucose, Bld: 97 mg/dL (ref 65–99)
Glucose, Bld: 98 mg/dL (ref 65–99)
POTASSIUM: 2.8 mmol/L — AB (ref 3.5–5.1)
Potassium: 3.4 mmol/L — ABNORMAL LOW (ref 3.5–5.1)
SODIUM: 136 mmol/L (ref 135–145)
Sodium: 136 mmol/L (ref 135–145)
Total Bilirubin: 1.5 mg/dL — ABNORMAL HIGH (ref 0.3–1.2)
Total Bilirubin: 1.1 mg/dL (ref 0.3–1.2)
Total Protein: 4.8 g/dL — ABNORMAL LOW (ref 6.5–8.1)
Total Protein: 4.8 g/dL — ABNORMAL LOW (ref 6.5–8.1)

## 2017-10-18 LAB — CBC WITH DIFFERENTIAL/PLATELET
Basophils Absolute: 0 10*3/uL (ref 0.0–0.1)
Basophils Relative: 0 %
EOS ABS: 0 10*3/uL (ref 0.0–0.7)
Eosinophils Relative: 0 %
HCT: 28.4 % — ABNORMAL LOW (ref 39.0–52.0)
HEMOGLOBIN: 9.1 g/dL — AB (ref 13.0–17.0)
LYMPHS ABS: 0.3 10*3/uL — AB (ref 0.7–4.0)
LYMPHS PCT: 3 %
MCH: 26.3 pg (ref 26.0–34.0)
MCHC: 32 g/dL (ref 30.0–36.0)
MCV: 82.1 fL (ref 78.0–100.0)
Monocytes Absolute: 0.3 10*3/uL (ref 0.1–1.0)
Monocytes Relative: 3 %
NEUTROS PCT: 94 %
Neutro Abs: 8 10*3/uL — ABNORMAL HIGH (ref 1.7–7.7)
Platelets: 270 10*3/uL (ref 150–400)
RBC: 3.46 MIL/uL — AB (ref 4.22–5.81)
RDW: 20.8 % — ABNORMAL HIGH (ref 11.5–15.5)
WBC: 8.5 10*3/uL (ref 4.0–10.5)

## 2017-10-18 LAB — MAGNESIUM: Magnesium: 1.6 mg/dL — ABNORMAL LOW (ref 1.7–2.4)

## 2017-10-18 MED ORDER — ENSURE ENLIVE PO LIQD
237.0000 mL | Freq: Three times a day (TID) | ORAL | Status: DC
  Administered 2017-10-20 – 2017-11-07 (×7): 237 mL via ORAL

## 2017-10-18 MED ORDER — POTASSIUM CHLORIDE 10 MEQ/100ML IV SOLN
10.0000 meq | INTRAVENOUS | Status: AC
  Administered 2017-10-18 (×3): 10 meq via INTRAVENOUS
  Filled 2017-10-18 (×4): qty 100

## 2017-10-18 MED ORDER — IOPAMIDOL (ISOVUE-300) INJECTION 61%
INTRAVENOUS | Status: AC
  Filled 2017-10-18: qty 30

## 2017-10-18 MED ORDER — SODIUM CHLORIDE 0.9 % IV SOLN
1.0000 g | Freq: Once | INTRAVENOUS | Status: AC
  Administered 2017-10-18: 1 g via INTRAVENOUS
  Filled 2017-10-18: qty 10

## 2017-10-18 MED ORDER — ADULT MULTIVITAMIN W/MINERALS CH
1.0000 | ORAL_TABLET | Freq: Every day | ORAL | Status: DC
  Administered 2017-10-18 – 2017-10-20 (×2): 1 via ORAL
  Filled 2017-10-18 (×3): qty 1

## 2017-10-18 NOTE — Progress Notes (Signed)
IP PROGRESS NOTE  Subjective:  No significant changes overnight.  No recurrent hematuria.  Tends to have multiple significant electrolyte abnormalities.  Objective: Vital signs in last 24 hours: Blood pressure (!) 97/58, pulse 93, temperature 98.4 F (36.9 C), temperature source Oral, resp. rate 16, height 6\' 2"  (1.88 m), weight 132 lb 15 oz (60.3 kg), SpO2 100 %.  Intake/Output from previous day: 11/14 0701 - 11/15 0700 In: 960 [I.V.:960] Out: 725 [Urine:725]  Physical Exam:  Patient is alert, awake, oriented x3.  No acute distress. HEENT: Muscle wasting, no oral mucosal lesions. Lungs: Clear to auscultation bilaterally Cardiac: S1/S2, regular, no murmurs Abdomen: Soft, nontender nondistended.  Bowel sounds are normoactive. Extremities: No lower extremity edema Portacath/PICC-without erythema.  Right nephrostomy in place draining clear yellow urine.   Lab Results: Recent Labs    10/16/17 1304 10/17/17 0415  WBC 9.6 6.0  HGB 11.2* 9.6*  HCT 35.3* 30.7*  PLT 378 276    BMET Recent Labs    10/17/17 0415 10/18/17 1135  NA 140 136  K 2.5* 2.8*  CL 107 105  CO2 21* 19*  GLUCOSE 73 97  BUN <5* <5*  CREATININE 0.39* 0.37*  CALCIUM 6.0* 5.6*    Lab Results  Component Value Date   CEA1 0.7 06/28/2017    Studies/Results: No results found.  Medications: I have reviewed the patient's current medications.  Assessment/Plan: 56 y.o. 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  Severe calorie/protein malnutrition and remarkable and persistent electrolyte abnormalities.  Overall, patient continues to require significant amount of medical care.  He has recurrent hospitalizations for  the previously described reasons.  Today, I have discussed the difficult time that we are having keeping him out of the hospital as well as the high risk of continuation of systemic therapy for his malignancy.  It is hard to withhold therapy from potentially curable cancer, and patient is insistent on continuing attempting to treat him with systemic chemotherapy.  **Lymphoma: We will reassess disease with CT of the chest/abdomen/pelvis and bone scan in lieu of obtaining PET/CT as patient remains hospitalized at this time.  If disease is responding to therapy, we do have better reason to continue attempting treatments.  If there is progressive disease, patient needs to be transitioned to supportive care at this time as he is unlikely to tolerate the second lines of chemotherapy for the lymphoma indication.. --Proceeding with CT of the neck/chest/abdomen/pelvis and bone scan for restaging purposes  **Electrolytes/Nutrition: Severe mixed calorie/protein malnutrition with a good portion of it having to do with complete lack of appetite and effort on the patient's behalf to improve his nutrition.  I have discussed alternative of enteral nutrition via nasogastric or percutaneous gastric tube versus parenteral nutrition through the vein.  In the context of recurrent infections, parenteral nutrition will have significant risk of causing severe infectious complications including fungal infections and I do not advocate for total parenteral nutrition at this time.  Patient is very apprehensive about tube feeds and promises to make himself eat more at this time. --Recommend continued follow-up by nutrition services with calorie counts --Continue electrolyte replacement therapy by IV or by mouth as needed based on his daily labs.  **Disposition: Discharged from the hospital discretion of the primary treating service.  Whether patient returns back  to skilled nursing facility or home will depend on opinion of  physical/occupational therapy and patient's wishes.    LOS: 2 days   Ardath Sax, MD   10/18/2017, 4:05 PM

## 2017-10-18 NOTE — Progress Notes (Signed)
Initial Nutrition Assessment  DOCUMENTATION CODES:   Severe malnutrition in context of chronic illness, Underweight  INTERVENTION:   -Provide Ensure Enlive po TID, each supplement provides 350 kcal and 20 grams of protein -Provide Magic cup BID with meals, each supplement provides 290 kcal and 9 grams of protein -Provide Multivitamin with minerals daily -Encourage PO intake -RD will continue to monitor  NUTRITION DIAGNOSIS:   Severe Malnutrition related to cancer and cancer related treatments, chronic illness as evidenced by percent weight loss, energy intake < or equal to 75% for > or equal to 1 month, severe fat depletion, moderate muscle depletion.  GOAL:   Patient will meet greater than or equal to 90% of their needs  MONITOR:   PO intake, Supplement acceptance, Labs, Weight trends, Skin, I & O's  REASON FOR ASSESSMENT:   Low Braden    ASSESSMENT:   56 y.o. 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  Of note, patient was recently diagnosed with pulmonary embolism and has been continued on Eliquis prior to admission.  During workup, patient was also noted to be tachycardic and was subsequently referred to the emergency department for further workup.  Pt in room resting. Pt states he knows that he needs to eat better and focus on protein. H/o severe malnutrition from previous admissions. Oncology note from 11/14 states "Severe mixed calorie/protein malnutrition with a good portion of it having to do with complete lack of appetite and effort on the patient's behalf to improve his nutrition." Pt spoke with his doctor who states he needs to eat better or he will need nutrition support. Pt hesitant to get a feeding tube. Encouraged pt to consume small frequent meals,  eating every 2 hours. Pt likes Ensure and wants to start drinking those in between meals. Will also order Magic Cups with meals as well. Pt appreciative for RD's visit. Will continue to monitor for needs.  Per chart review, pt has lost 71 lb since 7/31 (35% wt loss x 3.5 months, significant for time frame).   Medications: Tums tablet TID, Marinol capsule daily, MAG-OX tablet BID, Protonix tablet daily, PHOS-NAK packet QID, IV KCl Labs reviewed: Low K, Mg   NUTRITION - FOCUSED PHYSICAL EXAM:    Most Recent Value  Orbital Region  Mild depletion  Upper Arm Region  Severe depletion  Thoracic and Lumbar Region  Moderate depletion  Buccal Region  Mild depletion  Temple Region  Mild depletion  Clavicle Bone Region  Moderate depletion  Clavicle and Acromion Bone Region  Moderate depletion  Scapular Bone Region  Unable to assess  Dorsal Hand  Moderate depletion  Patellar Region  Unable to assess  Anterior Thigh Region  Unable to assess  Posterior Calf Region  Unable to assess  Edema (RD Assessment)  Mild       Diet Order:  Diet regular Room service appropriate? Yes; Fluid consistency: Thin  EDUCATION NEEDS:   Education needs have been addressed  Skin:  Skin Assessment: Skin Integrity Issues: Skin Integrity Issues:: Stage II Stage II: ischial tuberosity  Last BM:  11/14  Height:   Ht Readings from Last 1 Encounters:  10/16/17 6\' 2"  (1.88 m)    Weight:   Wt Readings from Last 1 Encounters:  10/16/17 132 lb 15 oz (60.3 kg)    Ideal Body Weight:  86.36 kg  BMI:  Body mass index is 17.07 kg/m.  Estimated Nutritional Needs:   Kcal:  2150-2350  Protein:  115-125g  Fluid:  2.2L/day  Clayton Bibles, MS, RD, LDN [place] [facility] Pager: [phone] After Hours Pager: [phone]

## 2017-10-18 NOTE — Progress Notes (Signed)
PROGRESS NOTE    Cory Ibarra  GMW:[id number] DOB: [birth year] DOA: 10/16/2017 PCP: [name], [name]   Brief Narrative:  [name] is a 56 y.o. 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  Of note, patient was recently diagnosed with pulmonary embolism and has been continued on Eliquis prior to admission.  During workup, patient was also noted to be tachycardic and was subsequently referred to the emergency department for further workup.  ED Course: In the emergency department, patient was noted to have heart rate in the low 120s.  Potassium was noted to be 2.7 with calcium of 6.4.  Most recent magnesium was noted to be 1.6.  Patient was given 2 g of magnesium in the emergency department.  Repeat magnesium level is pending.  Even electrolyte abnormalities, hospitalist service was consulted for consideration for admission     Assessment & Plan:   Active Problems:   Gross hematuria   Hypocalcemia   Severe protein-calorie malnutrition (HCC)   Hypokalemia   Sinus tach -Appears to have resolved -Restart home medications of calcium supplementation - IV potassium and calcium supplementation  - Continue telemetry -Magnesium level from today pending - Continue labetalol   Hypocalcemia - Patient with history of persistently low calcium - Patient given 1 g of calcium gluconate in the emergency department - Restart patient home p.o. medications to replete calcium -Another IV dose of calcium gluconate is ordered after patient's calcium noted to be 5.6 this morning corrected to 6.8 -We will redraw a calcium level this evening and give another dose of IV calcium if necessary  Hypokalemia - Potassium 2.7 and ED with 10 mEq of potassium given by ED physician - Potassium of  2.8 this morning -Continue IV and p.o. replacement -Serial potassium runs ordered - Magnesium from this morning pending  History of B-cell lymphoma - He should followed by Dr. [name] - Undergoing chemotherapy as an outpatient -Appreciate patient's oncologist note from 10/17/2017 -Patient encouraged to increase nutritional needs as some of his electrolyte disturbances can be linked to malnutrition  Pulmonary emboli - Patient on therapeutic anticoagulation prior to his hospital admission for recently diagnosed pulmonary emboli. -Can restart anticoagulation   Objective uropathy with nephrostomy tube, hematuria - Case was discussed with urology by the ED physician.  No signs of obstruction. - Will continue to monitor closely -Repeat CBC in a.m. given the fact that we are restarting anticoagulation today      DVT prophylaxis: SCDs Code Status: DNR Family Communication: No family bedside Disposition Plan: Pending improvement in electrolytes   Consultants:   Urology phone call by emergency department physician  Palliative care  Oncology  Procedures:   None  Antimicrobials:   None   Subjective: Patient seen and examined during bedside rounds.  Per notes from yesterday he has discussed with case management and social work that he would like to discharge home with palliative care.  On further questioning this morning patient appears to be confused as to what palliative care on discharge would entail.  It seems more likely that he is looking for home health.  However on further questioning he states that when he discharges he will go back to his rehab facility.  Objective: Vitals:   10/17/17 0830 10/17/17 1409 10/17/17 2135 10/18/17 0416  BP: 101/63 103/66 (!) 110/56 105/66  Pulse: 95 92 83  93  Resp: 15 18 18 18   Temp: 98 F (36.7 C) 98.2 F (36.8 C) 98 F (36.7 C) 97.9 F (36.6 C)  TempSrc: Oral Oral Oral Oral  SpO2: 100% 100% 94% 100%  Weight:      Height:          Intake/Output Summary (Last 24 hours) at 10/18/2017 1339 Last data filed at 10/18/2017 1143 Gross per 24 hour  Intake 970 ml  Output 1175 ml  Net -205 ml   Filed Weights   10/16/17 1845  Weight: 60.3 kg (132 lb 15 oz)    Examination:  General exam: Appears calm and comfortable  Respiratory system: Clear to auscultation. Respiratory effort normal. Cardiovascular system: S1 & S2 heard, RRR. No JVD, murmurs, rubs, gallops or clicks. No pedal edema. Gastrointestinal system: Abdomen is nondistended, soft and nontender. No organomegaly or masses felt. Normal bowel sounds heard.  Right-sided nephrostomy tube in place draining clear yellow urine Central nervous system: Alert and oriented. No focal neurological deficits. Extremities: Symmetric 5 x 5 power. Skin: No rashes, lesions or ulcers Psychiatry: mood & affect appropriate.     Data Reviewed: I have personally reviewed following labs and imaging studies  CBC: Recent Labs  Lab 10/16/17 1304 10/17/17 0415  WBC 9.6 6.0  NEUTROABS 7.8*  --   HGB 11.2* 9.6*  HCT 35.3* 30.7*  MCV 81.1 81.9  PLT 378 810   Basic Metabolic Panel: Recent Labs  Lab 10/16/17 1304 10/16/17 1934 10/17/17 0415 10/18/17 1135  NA 138  --  140 136  K 2.7*  --  2.5* 2.8*  CL 99*  --  107 105  CO2 20*  --  21* 19*  GLUCOSE 90  --  73 97  BUN 5*  --  <5* <5*  CREATININE 0.38*  --  0.39* 0.37*  CALCIUM 6.4*  --  6.0* 5.6*  MG  --  2.2 2.0  --    GFR: Estimated Creatinine Clearance: 85.8 mL/min (A) (by C-G formula based on SCr of 0.37 mg/dL (L)). Liver Function Tests: Recent Labs  Lab 10/16/17 1304 10/17/17 0415 10/18/17 1135  AST 28 21 20   ALT 25 21 17   ALKPHOS 112 94 89  BILITOT 1.5* 1.4* 1.5*  PROT 5.7* 4.8* 4.8*  ALBUMIN 2.9* 2.6* 2.4*   No results for input(s): LIPASE, AMYLASE in the last 168 hours. No results for input(s): AMMONIA in the last 168 hours. Coagulation Profile: Recent Labs  Lab 10/16/17 1304  INR 1.44    Cardiac Enzymes: No results for input(s): CKTOTAL, CKMB, CKMBINDEX, TROPONINI in the last 168 hours. BNP (last 3 results) No results for input(s): PROBNP in the last 8760 hours. HbA1C: No results for input(s): HGBA1C in the last 72 hours. CBG: No results for input(s): GLUCAP in the last 168 hours. Lipid Profile: No results for input(s): CHOL, HDL, LDLCALC, TRIG, CHOLHDL, LDLDIRECT in the last 72 hours. Thyroid Function Tests: Recent Labs    10/16/17 1934  TSH 2.260   Anemia Panel: No results for input(s): VITAMINB12, FOLATE, FERRITIN, TIBC, IRON, RETICCTPCT in the last 72 hours. Sepsis Labs: No results for input(s): PROCALCITON, LATICACIDVEN in the last 168 hours.  Recent Results (from the past 240 hour(s))  C difficile quick scan w PCR reflex     Status: None   Collection Time: 10/17/17  6:05 PM  Result Value Ref Range Status   C Diff antigen NEGATIVE NEGATIVE Final   C Diff toxin NEGATIVE NEGATIVE Final   C  Diff interpretation No C. difficile detected.  Final         Radiology Studies: US Renal  Result Date: 10/16/2017 CLINICAL DATA:  Blood draining from the nephrostomy tube EXAM: RENAL / URINARY TRACT ULTRASOUND COMPLETE COMPARISON:  Fluoro spot radiographs of October 04, 2017 revealing a right nephrostomy and contrast in the right ureter. FINDINGS: Right Kidney: Length: 10.4 cm. The nephrostomy tube's pigtail can be seen within the mid renal collecting system. There is no hydronephrosis. No perinephric fluid collection is observed. The cortical echotexture is remains lower than that of the adjacent liver. Left Kidney: Length: 11.5 cm. Echogenicity within normal limits. No mass or hydronephrosis visualized. Bladder: The urinary bladder is decompressed by a Foley catheter. IMPRESSION: No etiology for the bloody nephrostomy tube drainage is observed. The proximal pigtail of the nephrostomy tube appears to lie in the mid renal collecting system. No hydronephrosis on the  right or left. Electronically Signed   By: David  Martinique M.D.   On: 10/16/2017 13:54        Scheduled Meds: . calcium carbonate  1,000 mg Oral TID  . dronabinol  5 mg Oral QAC lunch  . magnesium oxide  400 mg Oral BID  . OLANZapine zydis  2.5 mg Oral QHS  . oxyCODONE  10 mg Oral Q12H  . pantoprazole  40 mg Oral Q lunch  . potassium & sodium phosphates  1 packet Oral TID AC & HS  . potassium chloride  40 mEq Oral Once  . predniSONE  20 mg Oral Q breakfast  . sulfamethoxazole-trimethoprim  2 tablet Oral Once per day on Mon Wed Fri   Continuous Infusions: . sodium chloride 75 mL/hr at 10/18/17 0409  . calcium gluconate    . potassium chloride       LOS: 2 days    Time spent: 35 minutes    Loretha Stapler, MD [facility] Pager [phone]  If 7PM-7AM, please contact night-coverage [email or web address] Password [email or web address] 10/18/2017, 1:39 PM

## 2017-10-19 ENCOUNTER — Inpatient Hospital Stay (HOSPITAL_COMMUNITY): Payer: Medicaid Other

## 2017-10-19 ENCOUNTER — Encounter (HOSPITAL_COMMUNITY): Payer: Self-pay | Admitting: Radiology

## 2017-10-19 ENCOUNTER — Encounter (HOSPITAL_COMMUNITY): Payer: Medicaid Other

## 2017-10-19 ENCOUNTER — Other Ambulatory Visit (HOSPITAL_COMMUNITY): Payer: Self-pay

## 2017-10-19 LAB — COMPREHENSIVE METABOLIC PANEL
ALK PHOS: 87 U/L (ref 38–126)
ALK PHOS: 90 U/L (ref 38–126)
ALT: 16 U/L — ABNORMAL LOW (ref 17–63)
ALT: 16 U/L — AB (ref 17–63)
ANION GAP: 11 (ref 5–15)
ANION GAP: 10 (ref 5–15)
AST: 19 U/L (ref 15–41)
AST: 19 U/L (ref 15–41)
Albumin: 2.4 g/dL — ABNORMAL LOW (ref 3.5–5.0)
Albumin: 2.5 g/dL — ABNORMAL LOW (ref 3.5–5.0)
BILIRUBIN TOTAL: 0.8 mg/dL (ref 0.3–1.2)
BILIRUBIN TOTAL: 1 mg/dL (ref 0.3–1.2)
BUN: <5 mg/dL — ABNORMAL LOW (ref 6–20)
CALCIUM: 5.7 mg/dL — AB (ref 8.9–10.3)
CALCIUM: 6.1 mg/dL — AB (ref 8.9–10.3)
CO2: 22 mmol/L (ref 22–32)
CO2: 22 mmol/L (ref 22–32)
CREATININE: 0.31 mg/dL — AB (ref 0.61–1.24)
Chloride: 105 mmol/L (ref 101–111)
Chloride: 102 mmol/L (ref 101–111)
Creatinine, Ser: <0.3 mg/dL — ABNORMAL LOW (ref 0.61–1.24)
GFR calc non Af Amer: >60 mL/min (ref >60)
GLUCOSE: 73 mg/dL (ref 65–99)
Glucose, Bld: 77 mg/dL (ref 65–99)
POTASSIUM: 2.8 mmol/L — AB (ref 3.5–5.1)
Potassium: 3.1 mmol/L — ABNORMAL LOW (ref 3.5–5.1)
SODIUM: 134 mmol/L — AB (ref 135–145)
Sodium: 138 mmol/L (ref 135–145)
TOTAL PROTEIN: 4.7 g/dL — AB (ref 6.5–8.1)
TOTAL PROTEIN: 4.9 g/dL — AB (ref 6.5–8.1)

## 2017-10-19 LAB — CBC
HEMATOCRIT: 27.8 % — AB (ref 39.0–52.0)
HEMOGLOBIN: 8.9 g/dL — AB (ref 13.0–17.0)
MCH: 26.4 pg (ref 26.0–34.0)
MCHC: 32 g/dL (ref 30.0–36.0)
MCV: 82.5 fL (ref 78.0–100.0)
Platelets: 243 10*3/uL (ref 150–400)
RBC: 3.37 MIL/uL — ABNORMAL LOW (ref 4.22–5.81)
RDW: 20.9 % — AB (ref 11.5–15.5)
WBC: 4.9 10*3/uL (ref 4.0–10.5)

## 2017-10-19 LAB — MAGNESIUM: Magnesium: 1.6 mg/dL — ABNORMAL LOW (ref 1.7–2.4)

## 2017-10-19 MED ORDER — POTASSIUM CHLORIDE 10 MEQ/100ML IV SOLN
10.0000 meq | INTRAVENOUS | Status: AC
  Administered 2017-10-19 (×3): 10 meq via INTRAVENOUS
  Filled 2017-10-19 (×3): qty 100

## 2017-10-19 MED ORDER — IOPAMIDOL (ISOVUE-300) INJECTION 61%
100.0000 mL | Freq: Once | INTRAVENOUS | Status: AC | PRN
  Administered 2017-10-19: 100 mL via INTRAVENOUS

## 2017-10-19 MED ORDER — IOPAMIDOL (ISOVUE-300) INJECTION 61%
15.0000 mL | Freq: Once | INTRAVENOUS | Status: DC | PRN
  Administered 2017-10-19: 30 mL via ORAL
  Filled 2017-10-19: qty 30

## 2017-10-19 MED ORDER — IOPAMIDOL (ISOVUE-300) INJECTION 61%
INTRAVENOUS | Status: AC
  Administered 2017-10-19: 100 mL via INTRAVENOUS
  Filled 2017-10-19: qty 100

## 2017-10-19 MED ORDER — SODIUM CHLORIDE 0.9 % IV SOLN
1.0000 g | Freq: Once | INTRAVENOUS | Status: AC
  Administered 2017-10-19: 1 g via INTRAVENOUS
  Filled 2017-10-19: qty 10

## 2017-10-19 MED ORDER — IOPAMIDOL (ISOVUE-300) INJECTION 61%
INTRAVENOUS | Status: AC
  Administered 2017-10-19: 30 mL via ORAL
  Filled 2017-10-19: qty 30

## 2017-10-19 MED ORDER — MAGNESIUM SULFATE 2 GM/50ML IV SOLN
2.0000 g | Freq: Once | INTRAVENOUS | Status: AC
  Administered 2017-10-19: 2 g via INTRAVENOUS
  Filled 2017-10-19: qty 50

## 2017-10-19 MED ORDER — APIXABAN 5 MG PO TABS
5.0000 mg | ORAL_TABLET | Freq: Two times a day (BID) | ORAL | Status: DC
  Administered 2017-10-19 – 2017-10-31 (×24): 5 mg via ORAL
  Filled 2017-10-19 (×24): qty 1

## 2017-10-19 MED ORDER — CALCIUM GLUCONATE 10 % IV SOLN
1.0000 g | Freq: Once | INTRAVENOUS | Status: AC
  Administered 2017-10-19: 1 g via INTRAVENOUS
  Filled 2017-10-19: qty 10

## 2017-10-19 NOTE — Progress Notes (Addendum)
ANTICOAGULATION CONSULT NOTE - Initial Consult  Pharmacy Consult for Eliquis Indication: pulmonary embolus  Allergies  Allergen Reactions  . Bee Venom Swelling    Patient Measurements: Height: 6\' 2"  (188 cm) Weight: 132 lb 15 oz (60.3 kg) IBW/kg (Calculated) : 82.2  Vital Signs: Temp: 97.9 F (36.6 C) (11/16 1535) Temp Source: Oral (11/16 1535) BP: 102/64 (11/16 1535) Pulse Rate: 91 (11/16 1535)  Labs: Recent Labs    10/17/17 0415  10/18/17 1805 10/19/17 0344 10/19/17 1503  HGB 9.6*  --  9.1* 8.9*  --   HCT 30.7*  --  28.4* 27.8*  --   PLT 276  --  270 243  --   CREATININE 0.39*   < > 0.33* <0.30* 0.31*   < > = values in this interval not displayed.    Estimated Creatinine Clearance: 85.8 mL/min (A) (by C-G formula based on SCr of 0.31 mg/dL (L)).   Medical History: Past Medical History:  Diagnosis Date  . Cancer ([place])   . Tendonitis     Medications:  Medications Prior to Admission  Medication Sig Dispense Refill Last Dose  . apixaban (ELIQUIS) 5 MG TABS tablet Take 2 tabs po BID x7 days, then take 1 tab po BID 90 tablet 0 10/15/2017 at 0900  . calcium elemental as carbonate (BARIATRIC TUMS ULTRA) 400 MG chewable tablet Chew 3 tablets (1,200 mg total) 3 (three) times daily by mouth.   10/15/2017 at 2100  . chlorhexidine (PERIDEX) 0.12 % solution 15 mLs by Mouth Rinse route 2 (two) times daily. 120 mL 0 10/15/2017 at 2200  . Cholecalciferol 50000 units TABS Take 50,000 Units once a week by mouth. for 8 weeks, then return for level recheck (Patient taking differently: Take 50,000 Units every Monday by mouth. for 8 weeks, then return for level recheck) 8 tablet 0 10/15/2017 at 0900  . dronabinol (MARINOL) 5 MG capsule Take 1 capsule (5 mg total) daily before lunch by mouth. 30 capsule 0 10/15/2017 at 1200  . Lidocaine-Prilocaine, Bulk, 2.5-2.5 % CREA Apply to affected areas topically one time daily   10/15/2017 at 1000  . LORazepam (ATIVAN) 0.5 MG tablet Take 1  tablet (0.5 mg total) every 6 (six) hours as needed for up to 14 days by mouth (Nausea or vomiting). 60 tablet 0 unknown  . magnesium oxide (MAG-OX) 400 (241.3 Mg) MG tablet Take 1 tablet (400 mg total) by mouth 2 (two) times daily. 60 tablet 0 10/15/2017 at 2200  . Multiple Vitamin (MULTIVITAMIN WITH MINERALS) TABS tablet Take 1 tablet by mouth daily.   10/15/2017 at 1000  . OLANZapine zydis (ZYPREXA) 5 MG disintegrating tablet Take 0.5 tablets (2.5 mg total) by mouth at bedtime. 30 tablet 0 10/15/2017 at 2100  . oxyCODONE (OXYCONTIN) 10 mg 12 hr tablet Take 1 tablet (10 mg total) every 12 (twelve) hours by mouth. 60 tablet 0 10/16/2017 at 0600  . Oxycodone HCl 10 MG TABS Take 10 mg every 4 (four) hours as needed by mouth (for pain).   10/13/2017  . pantoprazole (PROTONIX) 40 MG tablet Take 40 mg by mouth daily with lunch.    10/15/2017 at 1200  . polyethylene glycol (MIRALAX / GLYCOLAX) packet Take 17 g daily as needed by mouth for moderate constipation.    unknown  . potassium & sodium phosphates (PHOS-NAK) 280-160-250 MG PACK Take 1 packet by mouth 4 (four) times daily -  before meals and at bedtime. 120 packet 0 10/16/2017 at 0630  . predniSONE (DELTASONE)  10 MG tablet Take 2 tablets (20 mg total) daily with breakfast by mouth. 60 tablet 0 10/15/2017 at 0800  . prochlorperazine (COMPAZINE) 10 MG tablet Take 10 mg every 6 (six) hours as needed by mouth for nausea or vomiting.   unknown  . senna-docusate (SENOKOT-S) 8.6-50 MG tablet Take 1 tablet by mouth daily as needed for mild constipation.   unknown  . sulfamethoxazole-trimethoprim (BACTRIM,SEPTRA) 200-40 MG/5ML suspension Take 20 mLs by mouth every Monday, Wednesday, and Friday. Continuous   10/15/2017 at 0900  . oxyCODONE (ROXICODONE) 5 MG immediate release tablet Take 1 tablet (5 mg total) every 4 (four) hours as needed by mouth for severe pain. (Patient not taking: Reported on 10/16/2017) 100 tablet 0 Not Taking at Unknown time   Scheduled:   . apixaban  5 mg Oral BID  . calcium carbonate  1,000 mg Oral TID  . dronabinol  5 mg Oral QAC lunch  . feeding supplement (ENSURE ENLIVE)  237 mL Oral TID BM  . magnesium oxide  400 mg Oral BID  . multivitamin with minerals  1 tablet Oral Daily  . OLANZapine zydis  2.5 mg Oral QHS  . oxyCODONE  10 mg Oral Q12H  . pantoprazole  40 mg Oral Q lunch  . potassium & sodium phosphates  1 packet Oral TID AC & HS  . potassium chloride  40 mEq Oral Once  . predniSONE  20 mg Oral Q breakfast  . sulfamethoxazole-trimethoprim  2 tablet Oral Once per day on Mon Wed Fri    Assessment: 33 yoM with PMH T-cell lymphoma, chronically low K, Mg, Ca, admitted for bleeding through nephrostomy tube. Started on Eliquis for new PE on 11/5 which was held on admission for bleeding. Ok to resume Eliquis today after resolution of hematuria.  Today, 10/19/2017:  Hgb low but stable; Plt wnl  CrCl wnl  No further bleeding  Goal of Therapy:   Prevent VTE recurrence   Plan:   Resume Eliquis 5 mg PO BID (transitioned from 10 mg bid to 5 mg bid on 11/13)  Pharmacy will sign off, as further dose adjustments unlikely  Reuel Boom, PharmD, BCPS Pager: [phone] 10/19/2017, 6:52 PM

## 2017-10-19 NOTE — Progress Notes (Signed)
Nutrition Brief Note  RD consulted for 48 hour calorie count.  Calorie count will start will begin 11/17 to 11/18.   Please hang calorie count envelope on the patient's door.  Document percent consumed for each item on the patient's meal tray ticket and keep in envelope.  Also document percent of any supplement or snack pt consumes and keep documentation in envelope for RD to review.   RD will remove envelope at the end of 48 hours.   Mariana Single RD, LDN Clinical Nutrition Pager # [phone]

## 2017-10-19 NOTE — Progress Notes (Signed)
PROGRESS NOTE    Cory Ibarra  LZJ:[id number] DOB: 09-28-1961 DOA: 10/16/2017 PCP: [name], [name]   Brief Narrative:  Cory Ibarra is a 56 y.o. 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  Of note, patient was recently diagnosed with pulmonary embolism and has been continued on Eliquis prior to admission.  During workup, patient was also noted to be tachycardic and was subsequently referred to the emergency department for further workup.  ED Course: In the emergency department, patient was noted to have heart rate in the low 120s.  Potassium was noted to be 2.7 with calcium of 6.4.  Most recent magnesium was noted to be 1.6.  Patient was given 2 g of magnesium in the emergency department.  Repeat magnesium level is pending.  Even electrolyte abnormalities, hospitalist service was consulted for consideration for admission  Patient continued to be seen by his oncologist during his hospitalization.  CT of the neck chest abdomen pelvis and bone scan were ordered for 10/19/2017.  He continued to have multiple electrolyte abnormalities throughout hospitalization.    Assessment & Plan:   Active Problems:   Gross hematuria   Hypocalcemia   Severe protein-calorie malnutrition (HCC)   Hypokalemia   Sinus tach -Appears to have resolved -Restart home medications of calcium supplementation - IV potassium and calcium supplementation and repeated again this morning as well as later this morning - Continue telemetry -Magnesium level from today pending - Continue labetalol   Hypocalcemia - Patient with history of persistently low calcium - Patient given 1 g of calcium gluconate in the emergency department - Restart patient home p.o. medications to replete calcium -2 separate 1  g doses of calcium gluconate ordered for the today -Agree with Dr. Clydene Laming note that likely significant electrolyte imbalances due to malnutrition and poor p.o. intake  Hypokalemia - Potassium 2.7 and ED with 10 mEq of potassium given by ED physician - Potassium of 2.8 this morning -Continue IV and p.o. replacement -Likely due to poor p.o. intake and malnutrition  History of B-cell lymphoma - He should followed by Dr. [name] - Undergoing chemotherapy as an outpatient -Appreciate patient's oncologist input -Patient encouraged to increase nutritional needs as some of his electrolyte disturbances can be linked to malnutrition -CT pending bone scan pending: Follow-up on results  Pulmonary emboli - Patient on therapeutic anticoagulation prior to his hospital admission for recently diagnosed pulmonary emboli. -Can restart anticoagulation today   Objective uropathy with nephrostomy tube, hematuria - Case was discussed with urology by the ED physician.  No signs of obstruction. - Will continue to monitor closely      DVT prophylaxis: SCDs Code Status: DNR Family Communication: No family bedside Disposition Plan: Pending improvement in electrolytes   Consultants:   Urology phone call by emergency department physician  Palliative care  Oncology  Procedures:   None  Antimicrobials:   None   Subjective: Patient was given 1 run of IV calcium this morning.  He is sitting up in bed at time of rounds stating that he is anxious for the results of his CT scan to see if his cancer has progressed.  He states when he discharges he will go back to a skilled nursing facility.  Objective: Vitals:   10/18/17 0416 10/18/17 1346 10/18/17 2115 10/19/17 0531  BP: 105/66 (!) 97/58 110/60 (!) 105/48  Pulse: 93  81 86  Resp: 18 16 16 15   Temp: 97.9 F (36.6 C) 98.4 F (36.9 C) 97.9 F (36.6 C) 98 F (36.7 C)  TempSrc: Oral Oral Oral Oral  SpO2: 100% 100% 100% 100%  Weight:        Height:        Intake/Output Summary (Last 24 hours) at 10/19/2017 1422 Last data filed at 10/19/2017 1159 Gross per 24 hour  Intake 1270 ml  Output 2000 ml  Net -730 ml   Filed Weights   10/16/17 1845  Weight: 60.3 kg (132 lb 15 oz)    Examination:  General exam: Cachectic Respiratory system: Clear to auscultation. Respiratory effort normal. Cardiovascular system: S1 & S2 heard, RRR. No JVD, murmurs, rubs, gallops or clicks. No pedal edema. Gastrointestinal system: Abdomen is nondistended, soft and nontender. No organomegaly or masses felt. Normal bowel sounds heard.  Right-sided nephrostomy tube in place draining clear yellow urine Central nervous system: Alert and oriented to person place time. No focal neurological deficits. Extremities: Symmetric 5 x 5 power. Skin: No rashes, lesions or ulcers Psychiatry: mood & affect appropriate.  Questionable insight   Data Reviewed: I have personally reviewed following labs and imaging studies  CBC: Recent Labs  Lab 10/16/17 1304 10/17/17 0415 10/18/17 1805 10/19/17 0344  WBC 9.6 6.0 8.5 4.9  NEUTROABS 7.8*  --  8.0*  --   HGB 11.2* 9.6* 9.1* 8.9*  HCT 35.3* 30.7* 28.4* 27.8*  MCV 81.1 81.9 82.1 82.5  PLT 378 276 270 756   Basic Metabolic Panel: Recent Labs  Lab 10/16/17 1304 10/16/17 1934 10/17/17 0415 10/18/17 1135 10/18/17 1805 10/19/17 0344  NA 138  --  140 136 136 138  K 2.7*  --  2.5* 2.8* 3.4* 2.8*  CL 99*  --  107 105 104 105  CO2 20*  --  21* 19* 19* 22  GLUCOSE 90  --  73 97 98 73  BUN 5*  --  <5* <5* <5* <5*  CREATININE 0.38*  --  0.39* 0.37* 0.33* <0.30*  CALCIUM 6.4*  --  6.0* 5.6* 5.8* 5.7*  MG  --  2.2 2.0 1.6*  --   --    GFR: CrCl cannot be calculated (This lab value cannot be used to calculate CrCl because it is not a number: <0.30). Liver Function Tests: Recent Labs  Lab 10/16/17 1304 10/17/17 0415 10/18/17 1135 10/18/17 1805 10/19/17 0344  AST 28 21 20 19 19   ALT 25 21 17 17  16*   ALKPHOS 112 94 89 89 87  BILITOT 1.5* 1.4* 1.5* 1.1 0.8  PROT 5.7* 4.8* 4.8* 4.8* 4.7*  ALBUMIN 2.9* 2.6* 2.4* 2.5* 2.4*   No results for input(s): LIPASE, AMYLASE in the last 168 hours. No results for input(s): AMMONIA in the last 168 hours. Coagulation Profile: Recent Labs  Lab 10/16/17 1304  INR 1.44   Cardiac Enzymes: No results for input(s): CKTOTAL, CKMB, CKMBINDEX, TROPONINI in the last 168 hours. BNP (last 3 results) No results for input(s): PROBNP in the last 8760 hours. HbA1C: No results for input(s): HGBA1C in the last 72 hours. CBG: No results for input(s): GLUCAP in the last 168 hours. Lipid Profile: No results for input(s): CHOL, HDL, LDLCALC, TRIG, CHOLHDL, LDLDIRECT in the last 72 hours. Thyroid Function Tests: Recent Labs    10/16/17 1934  TSH 2.260   Anemia Panel: No results for input(s): VITAMINB12, FOLATE, FERRITIN, TIBC, IRON, RETICCTPCT in the last 72 hours. Sepsis  Labs: No results for input(s): PROCALCITON, LATICACIDVEN in the last 168 hours.  Recent Results (from the past 240 hour(s))  C difficile quick scan w PCR reflex     Status: None   Collection Time: 10/17/17  6:05 PM  Result Value Ref Range Status   C Diff antigen NEGATIVE NEGATIVE Final   C Diff toxin NEGATIVE NEGATIVE Final   C Diff interpretation No C. difficile detected.  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 Spinal involvement. EXAM: CT NECK WITH CONTRAST TECHNIQUE: Multidetector CT imaging of the neck was performed using the standard protocol following the bolus administration of intravenous contrast. CONTRAST:  137mL ISOVUE-300 IOPAMIDOL (ISOVUE-300) INJECTION 61% in conjunction with contrast enhanced imaging of the chest, abdomen, and pelvis reported separately. COMPARISON:  No prior neck or head CT. Chest CTA 10/03/2017 and  earlier. Chest CT today reported separately. FINDINGS: Pharynx and larynx: Asymmetric medial deviation of the right vocal fold raises the possibility of right vocal cord palsy and appears to be new since October (series 5, image 71). Posterior to the right aryepiglottic fold there is a circumscribed partially round, partially triangular simple fluid density mass encompassing 10 x 22 x 14 mm (AP by transverse by CC) with subtotal effacement of the right piriform sinus. See series 5, image 61 and sagittal image 49. This is separate from the epiglottis which remains normal. There is mild adjacent linear mucosal enhancement. The lesion slightly crosses midline to the left. The epiglottis is unaffected and normal. Elsewhere the hypopharynx is normal. oropharynx and nasopharynx contours appear normal. Negative parapharyngeal and retropharyngeal spaces. Salivary glands: Sublingual space, submandibular glands and parotid glands are within normal limits. Thyroid: Negative. Lymph nodes: Bilateral cervical lymph nodes are within normal limits. A conspicuous left level IIa node measures 6-7 mm short axis (sagittal image 74). No abnormally enlarged lymph nodes. No cystic or necrotic nodes. Vascular: Right chest IJ approach porta cath. Major vascular structures in the neck and at the skullbase are patent. Limited intracranial: Negative. Visualized orbits: Negative. Mastoids and visualized paranasal sinuses: Visible paranasal sinuses are clear. Visible tympanic cavities are clear. Mild right mastoid effusion. The left mastoids are clear. Skeleton: Lytic destruction of the condyle the right mandible in the TMJ, most apparent laterally (series 13, image 9). The right temporal fossa appears normal. Subtotal lytic destruction of the right C1 lateral mass with collapse (coronal image 58). Lytic lesion extends toward the midline in the right anterior C1 ring. 3 cm lytic lesion mildly expanding the right C2 lamina tracking to midline  (series 13, image 34. No definite associated intraspinal mass (series 5, image 34). Lytic lesion of the left C3 transverse process and lamina (series 13, image 40). Subtotal lytic destruction of the C4 vertebral body with mild pathologic fracture (sagittal image 53). Bilateral C5 neural foraminal involvement suspected. Bilateral lytic lesion in the bilateral T1 posterior elements. See also chest findings reported separately. Upper chest: Chest CT today reported separately. IMPRESSION: 1. No prior head or neck CT. 2. 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 Associated pathologic fractures of the right C1 lateral mass and the C4 vertebral body. 3. No lymphadenopathy in the neck. 4. There is a 2 cm circumscribed cyst in the posterior right hypopharynx contiguous with the right AE fold which  appears to be new since the CTA chest 10/03/2017. Burtis Junes this is a benign retention cyst. 5. Right mastoid effusion, likely reactive to #2. 6. See  CT Chest, Abdomen, and Pelvis today reported separately. Electronically Signed   By: Genevie Ann M.D.   On: 10/19/2017 12:18        Scheduled Meds: . calcium carbonate  1,000 mg Oral TID  . dronabinol  5 mg Oral QAC lunch  . feeding supplement (ENSURE ENLIVE)  237 mL Oral TID BM  . magnesium oxide  400 mg Oral BID  . multivitamin with minerals  1 tablet Oral Daily  . OLANZapine zydis  2.5 mg Oral QHS  . oxyCODONE  10 mg Oral Q12H  . pantoprazole  40 mg Oral Q lunch  . potassium & sodium phosphates  1 packet Oral TID AC & HS  . potassium chloride  40 mEq Oral Once  . predniSONE  20 mg Oral Q breakfast  . sulfamethoxazole-trimethoprim  2 tablet Oral Once per day on Mon Wed Fri   Continuous Infusions: . sodium chloride 75 mL/hr at 10/19/17 0135     LOS: 3 days    Time spent: 30 minutes    Loretha Stapler, MD [facility] Pager [phone]  If 7PM-7AM, please contact night-coverage [email or web address] Password  [email or web address] 10/19/2017, 2:22 PM

## 2017-10-19 NOTE — Progress Notes (Signed)
CRITICAL VALUE ALERT  Critical Value:  CA 6.1  Date & Time Notied:  10/19/2017 0420  Provider Notified: Adair Patter MD   Orders Received/Actions taken: None given; lab is trending up

## 2017-10-19 NOTE — Progress Notes (Signed)
LCSW following for return to facility.   Patient is from [facility]. Patient states that he wants to go home with palliative at [place]. However he also stated that he was going back to his facility. Disposition is unclear.  LCSW attempted to reach patients POA and brother Nishant Schrecengost by phone with no success.   Carolin Coy [place]

## 2017-10-20 LAB — COMPREHENSIVE METABOLIC PANEL
ALBUMIN: 2.6 g/dL — AB (ref 3.5–5.0)
ALK PHOS: 93 U/L (ref 38–126)
ALT: 14 U/L — ABNORMAL LOW (ref 17–63)
ALT: 16 U/L — AB (ref 17–63)
ANION GAP: 11 (ref 5–15)
AST: 19 U/L (ref 15–41)
AST: 20 U/L (ref 15–41)
Albumin: 2.5 g/dL — ABNORMAL LOW (ref 3.5–5.0)
Alkaline Phosphatase: 89 U/L (ref 38–126)
Anion gap: 13 (ref 5–15)
BILIRUBIN TOTAL: 1 mg/dL (ref 0.3–1.2)
BUN: <5 mg/dL — ABNORMAL LOW (ref 6–20)
BUN: <5 mg/dL — ABNORMAL LOW (ref 6–20)
CALCIUM: 5.7 mg/dL — AB (ref 8.9–10.3)
CHLORIDE: 103 mmol/L (ref 101–111)
CHLORIDE: 100 mmol/L — AB (ref 101–111)
CO2: 24 mmol/L (ref 22–32)
CO2: 23 mmol/L (ref 22–32)
CREATININE: 0.35 mg/dL — AB (ref 0.61–1.24)
Calcium: 6.1 mg/dL — CL (ref 8.9–10.3)
Creatinine, Ser: 0.32 mg/dL — ABNORMAL LOW (ref 0.61–1.24)
GFR calc Af Amer: >60 mL/min (ref >60)
GFR calc non Af Amer: >60 mL/min (ref >60)
GLUCOSE: 117 mg/dL — AB (ref 65–99)
Glucose, Bld: 76 mg/dL (ref 65–99)
POTASSIUM: 2.9 mmol/L — AB (ref 3.5–5.1)
Potassium: 3.3 mmol/L — ABNORMAL LOW (ref 3.5–5.1)
SODIUM: 136 mmol/L (ref 135–145)
Sodium: 138 mmol/L (ref 135–145)
TOTAL PROTEIN: 4.8 g/dL — AB (ref 6.5–8.1)
Total Bilirubin: 1.3 mg/dL — ABNORMAL HIGH (ref 0.3–1.2)
Total Protein: 5.1 g/dL — ABNORMAL LOW (ref 6.5–8.1)

## 2017-10-20 MED ORDER — MIRTAZAPINE 15 MG PO TBDP
15.0000 mg | ORAL_TABLET | Freq: Every day | ORAL | Status: DC
  Administered 2017-10-20 – 2017-10-24 (×5): 15 mg via ORAL
  Filled 2017-10-20 (×6): qty 1

## 2017-10-20 NOTE — Progress Notes (Signed)
CRITICAL VALUE ALERT  Critical Value:  Ca 6.1  Date & Time Notied:  10/20/17 0557  Provider Notified: [id number]  Orders Received/Actions taken: notified MD

## 2017-10-20 NOTE — Progress Notes (Signed)
Patient Demographics:    Cory Ibarra, is a 56 y.o. male, DOB - Feb 20, 1961, EPP:[id number]  Admit date - 10/16/2017   Admitting Physician Donne Hazel, MD  Outpatient Primary MD for the patient is [name], [name]  LOS - 4   No chief complaint on file.       Subjective:    Cory Ibarra today has no fevers, no emesis,  No chest pain,  Appetite remains poor  Assessment  & Plan :    Active Problems:   Gross hematuria   Hypocalcemia   Severe protein-calorie malnutrition (HCC)   Hypokalemia  Brief Narrative Cory Ibarra is a 56 y.o. 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  Of note, patient was recently diagnosed with pulmonary embolism and has been continued on Eliquis prior to admission.  She was admitted on 10/16/2017 with electrolyte derangement, shortness of breath, poor oral intake, dehydration and tachycardia   1)FEN/Anorexia/Electrolyte Abnormalities and sinus Tachycardia- continue to replace and recheck lytes, nutritional deficiencies are contributing to patient's electrolyte derangement.  Continue Marinol, add Remeron 15 mg nightly for appetite stimulation  2)H/o PE-continue Eliquis  3) H/o obstructive uropathy-right-sided nephrostomy tube and Foley catheter appears to be draining well, no significant hematuria at this time  4)H/o B cell Lymphoma- f/u with Dr [name] for ongoing outpatient chemotherapy  Code Status : DNR   Disposition Plan  : TBD  DVT Prophylaxis  : Eliquis  Lab Results  Component Value Date   PLT 243 10/19/2017    Inpatient Medications  Scheduled Meds: . apixaban  5 mg Oral BID  . calcium carbonate  1,000 mg Oral TID  . dronabinol  5 mg Oral  QAC lunch  . feeding supplement (ENSURE ENLIVE)  237 mL Oral TID BM  . magnesium oxide  400 mg Oral BID  . mirtazapine  15 mg Oral QHS  . multivitamin with minerals  1 tablet Oral Daily  . OLANZapine zydis  2.5 mg Oral QHS  . oxyCODONE  10 mg Oral Q12H  . pantoprazole  40 mg Oral Q lunch  . potassium & sodium phosphates  1 packet Oral TID AC & HS  . potassium chloride  40 mEq Oral Once  . predniSONE  20 mg Oral Q breakfast  . sulfamethoxazole-trimethoprim  2 tablet Oral Once per day on Mon Wed Fri   Continuous Infusions: . sodium chloride 75 mL/hr at 10/19/17 0135   PRN Meds:.acetaminophen **OR** acetaminophen, iopamidol, ketorolac, labetalol, LORazepam, oxyCODONE, sodium chloride flush    Anti-infectives (From admission, onward)   Start     Dose/Rate Route Frequency Ordered Stop   10/17/17 1000  sulfamethoxazole-trimethoprim (BACTRIM,SEPTRA) 200-40 MG/5ML suspension 20 mL  Status:  Discontinued    Comments:  Continuous     20 mL Oral Every M-W-F 10/16/17 1558 10/17/17 0936   10/17/17 1000  sulfamethoxazole-trimethoprim (BACTRIM,SEPTRA) 400-80 MG per tablet 2 tablet     2 tablet Oral Once per day on Mon Wed Fri 10/17/17 0936          Objective:   Vitals:   10/19/17 1535 10/19/17  2035 10/20/17 0534 10/20/17 1440  BP: 102/64 104/67 104/72 101/70  Pulse: 91 86 90 93  Resp: 20 20 18 18   Temp: 97.9 F (36.6 C) 98.1 F (36.7 C) (!) 97.5 F (36.4 C) 98.3 F (36.8 C)  TempSrc: Oral Oral Oral Oral  SpO2: 100% 98% 98% 100%  Weight:      Height:        Wt Readings from Last 3 Encounters:  10/16/17 60.3 kg (132 lb 15 oz)  10/11/17 65.4 kg (144 lb 3.2 oz)  10/09/17 65.4 kg (144 lb 3.2 oz)     Intake/Output Summary (Last 24 hours) at 10/20/2017 2021 Last data filed at 10/20/2017 1848 Gross per 24 hour  Intake 2328.75 ml  Output 3725 ml  Net -1396.25 ml     Physical Exam  Gen:- Awake Alert, patient looks emaciated/cachectic HEENT:- [place].AT, No sclera  icterus Neck-Supple Neck,No JVD,.  Lungs-  CTAB  CV- S1, S2 normal Abd-  +ve B.Sounds, Abd Soft, No tenderness,   right-sided nephrostomy tube without hematuria Extremity/Skin:- No  edema,    GU-Foley catheter without hematuria   Data Review:   Micro Results Recent Results (from the past 240 hour(s))  C difficile quick scan w PCR reflex     Status: None   Collection Time: 10/17/17  6:05 PM  Result Value Ref Range Status   C Diff antigen NEGATIVE NEGATIVE Final   C Diff toxin NEGATIVE NEGATIVE Final   C Diff interpretation No C. difficile detected.  Final    Radiology Reports Dg Chest 2 View  Result Date: 10/03/2017 CLINICAL DATA:  Shortness of breath. EXAM: CHEST  2 VIEW COMPARISON:  Radiographs of August 21, 2017. FINDINGS: The heart size and mediastinal contours are within normal limits. Both lungs are clear. No pneumothorax or pleural effusion is noted. Right internal jugular Port-A-Cath is unchanged in position. Hyperexpansion of the lungs is noted. The visualized skeletal structures are unremarkable. IMPRESSION: No active cardiopulmonary disease. Electronically Signed   By: Marijo Conception, M.D.   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 Spinal involvement. EXAM: CT NECK WITH CONTRAST TECHNIQUE: Multidetector CT imaging of the neck was performed using the standard protocol following the bolus administration of intravenous contrast. CONTRAST:  170mL ISOVUE-300 IOPAMIDOL (ISOVUE-300) INJECTION 61% in conjunction with contrast enhanced imaging of the chest, abdomen, and pelvis reported separately. COMPARISON:  No prior neck or head CT. Chest CTA 10/03/2017 and earlier. Chest CT today reported separately. FINDINGS: Pharynx and larynx: Asymmetric medial deviation of the right vocal fold raises the possibility of right vocal cord palsy and appears  to be new since October (series 5, image 71). Posterior to the right aryepiglottic fold there is a circumscribed partially round, partially triangular simple fluid density mass encompassing 10 x 22 x 14 mm (AP by transverse by CC) with subtotal effacement of the right piriform sinus. See series 5, image 61 and sagittal image 49. This is separate from the epiglottis which remains normal. There is mild adjacent linear mucosal enhancement. The lesion slightly crosses midline to the left. The epiglottis is unaffected and normal. Elsewhere the hypopharynx is normal. oropharynx and nasopharynx contours appear normal. Negative parapharyngeal and retropharyngeal spaces. Salivary glands: Sublingual space, submandibular glands and parotid glands are within normal limits. Thyroid: Negative. Lymph nodes: Bilateral cervical lymph nodes are within normal limits. A conspicuous  left level IIa node measures 6-7 mm short axis (sagittal image 74). No abnormally enlarged lymph nodes. No cystic or necrotic nodes. Vascular: Right chest IJ approach porta cath. Major vascular structures in the neck and at the skullbase are patent. Limited intracranial: Negative. Visualized orbits: Negative. Mastoids and visualized paranasal sinuses: Visible paranasal sinuses are clear. Visible tympanic cavities are clear. Mild right mastoid effusion. The left mastoids are clear. Skeleton: Lytic destruction of the condyle the right mandible in the TMJ, most apparent laterally (series 13, image 9). The right temporal fossa appears normal. Subtotal lytic destruction of the right C1 lateral mass with collapse (coronal image 58). Lytic lesion extends toward the midline in the right anterior C1 ring. 3 cm lytic lesion mildly expanding the right C2 lamina tracking to midline (series 13, image 34. No definite associated intraspinal mass (series 5, image 34). Lytic lesion of the left C3 transverse process and lamina (series 13, image 40). Subtotal lytic destruction  of the C4 vertebral body with mild pathologic fracture (sagittal image 53). Bilateral C5 neural foraminal involvement suspected. Bilateral lytic lesion in the bilateral T1 posterior elements. See also chest findings reported separately. Upper chest: Chest CT today reported separately. IMPRESSION: 1. No prior head or neck CT. 2. 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 Associated pathologic fractures of the right C1 lateral mass and the C4 vertebral body. 3. No lymphadenopathy in the neck. 4. There is a 2 cm circumscribed cyst in the posterior right hypopharynx contiguous with the right AE fold which appears to be new since the CTA chest 10/03/2017. Burtis Junes this is a benign retention cyst. 5. Right mastoid effusion, likely reactive to #2. 6. See  CT Chest, Abdomen, and Pelvis today reported separately. Electronically Signed   By: Genevie Ann M.D.   On: 10/19/2017 12:18   Ct Chest W Contrast  Result Date: 10/19/2017 CLINICAL DATA:  Diffuse large B-cell lymphoma. EXAM: CT CHEST, ABDOMEN, AND PELVIS WITH CONTRAST TECHNIQUE: Multidetector CT imaging of the chest, abdomen and pelvis was performed following the standard protocol during bolus administration of intravenous contrast. CONTRAST:  167mL ISOVUE-300 IOPAMIDOL (ISOVUE-300) INJECTION 61%, 21mL ISOVUE-300 IOPAMIDOL (ISOVUE-300) INJECTION 61% COMPARISON:  Chest CT 10/03/2017 and prior CT scans 07/30/2017 FINDINGS: CT CHEST FINDINGS Cardiovascular: The heart is normal in size. No pericardial effusion. Stable mild tortuosity and ectasia of the thoracic aorta. Minimal atherosclerotic calcifications. The branch vessels are patent. No definite coronary artery calcifications. Mediastinum/Nodes: No mediastinal or hilar mass or lymphadenopathy. The esophagus is grossly normal. Lungs/Pleura: Patchy interstitial changes and underlying emphysema. Biapical pleural and parenchymal scarring. Stable small nodular lesion in the left upper  lobe adjacent to the mediastinum on image number 9 which measures 5.5 mm. Persistent areas of subpleural atelectasis but no new pulmonary lesions or acute superimposed pulmonary findings. Musculoskeletal: Much improved left chest wall mass this measured approximately 4.8 x 3.4 cm on the prior CT scan from July 30, 2017 and now measures 3.0 x 1.0 cm. Stable destructive bone lesions but no new or progressive findings involving the ribs or scapula. Small lytic lesions are noted in the right humeral head which appears stable. CT ABDOMEN PELVIS FINDINGS Hepatobiliary: No focal hepatic lesions or intrahepatic biliary dilatation. The gallbladder is mildly distended but no definite gallstones or findings for acute cholecystitis. No common bile duct dilatation. Pancreas: No mass, inflammation or ductal dilatation. Spleen: Normal size.  No focal lesions. Adrenals/Urinary Tract: The adrenal glands and left kidney  are unremarkable. There is a right-sided nephrostomy tube in place. Small right renal calculi are noted. There are 2 adjacent calculi in the upper lobe right ureter measuring E total a maximum of 7.5 mm. Moderate right-sided hydroureteronephrosis. No distal ureteral calculi or bladder calculi. No bladder mass. There is a Foley catheter in the bladder. Stomach/Bowel: The stomach, duodenum, small bowel and colon are unremarkable. No acute inflammatory changes, mass lesions or obstructive findings. Vascular/Lymphatic: The aorta and branch vessels are patent. The major venous structures are patent. Left-sided retroperitoneal nodal mass seen on the prior study the adjacent to the aorta measured 3.1 x 1.7 cm. It now has the appearance of matted low-attenuation soft tissue density measuring approximately 23 x 8 mm. No new mesenteric or retroperitoneal lymph nodes. No pelvic adenopathy. No inguinal adenopathy. Left para-aortic lymph node on the prior study measured 18 x 12 mm and is now matted soft tissue density measuring  11 x 5 mm. Reproductive: The prostate gland and seminal vesicles are unremarkable. Other: None Musculoskeletal: Diffuse osseous involvement is again demonstrated but findings overall improved. The soft tissue mass associated with the destructive bony lesion involving the right iliac crest has resolved. The right iliac crest bone lesion appears stable. Right iliac bone lesion appears smaller and slightly calcified. The right pubic bone lesion has improved. There is healing changes, calcification and no residual soft tissue component. Stable small lytic lesions in the right femoral head. Right acetabular lesion appears stable. New line stable vertebral plana at L3 and stable appearing involvement of T10 and T12. IMPRESSION: 1. Overall improved CT appearance of the patient's lymphoma most significantly involving the osseous structures. No new or progressive bony findings. 2. Significant improvement of the retroperitoneal lymph nodes. No new or progressive findings in the abdomen or pelvis. 3. Right-sided externalized nephrostomy tube with obstructing upper ureteral calculi and associated mild to moderate hydroureteronephrosis. 4. Stable lung findings. Electronically Signed   By: Marijo Sanes M.D.   On: 10/19/2017 15:37   Ct Angio Chest Pe W And/or Wo Contrast  Result Date: 10/03/2017 CLINICAL DATA:  History of lymphoma. There a tract infection. Short of breath. Hypoxia EXAM: CT ANGIOGRAPHY CHEST WITH CONTRAST TECHNIQUE: Multidetector CT imaging of the chest was performed using the standard protocol during bolus administration of intravenous contrast. Multiplanar CT image reconstructions and MIPs were obtained to evaluate the vascular anatomy. CONTRAST:  80 mL Isovue COMPARISON:  None. FINDINGS: Cardiovascular: There 2 small tubular filling defects within the proximal LEFT lower lobe pulmonary artery and proximal lingular pulmonary artery (image 61, series 5). These are consistent with thromboemboli. These are  nonocclusive. No additional filling defects identified within the pulmonary arteries bold No acute findings aorta great vessels. Heart is normal. No evidence of RIGHT ventricular strain with the RIGHT ventricular to LEFT ventricular diameter ratio less than 1. Mediastinum/Nodes: No axillary or supraclavicular adenopathy. No mediastinal hilar adenopathy. No pericardial fluid. Esophagus normal Lungs/Pleura: Within the LEFT lower lobe there is patchy nodular airspace disease involving the superior segment and the basilar segments. There is some consolidation of basilar segments. There is frothy material within the lower lobe bronchi (image 76, series 8). RIGHT lung is clear. Upper Abdomen: Limited view of the liver, kidneys, pancreas are unremarkable. Normal adrenal glands. Musculoskeletal: Again demonstrated lytic and expansile lesions within the posterior LEFT ribs and LEFT scapula (image 45, series 5 and image 56, series 5). Findings consistent bone metastasis previously described. Review of the MIP images confirms the above findings. IMPRESSION: 1.  Acute pulmonary embolism within the LEFT lower lobe and lingula. These small emboli are nonocclusive. Overall clot burden is minimal. 2. No evidence of RIGHT ventricular strain. 3. Patchy airspace disease in the LEFT lower lobe. Favor pulmonary infection such as pneumonia or aspiration rather than pulmonary infarction. 4. Frothy material within the LEFT lower lobe bronchus could represent aspirate from tracheal aspiration. Findings conveyed toNATHAN PICKERING on 10/03/2017  at12:55. Electronically Signed   By: Suzy Bouchard M.D.   On: 10/03/2017 13:00   Nm Bone Scan Whole Body  Result Date: 10/19/2017 CLINICAL DATA:  56 year old male for restaging of diffuse large B-cell lymphoma. EXAM: NUCLEAR MEDICINE WHOLE BODY BONE SCAN TECHNIQUE: Whole body anterior and posterior images were obtained approximately 3 hours after intravenous injection of radiopharmaceutical.  RADIOPHARMACEUTICALS:  21.0 mCi Technetium-62m MDP IV COMPARISON:  10/03/2017 chest CT and prior studies. No prior bone scans available for comparison. FINDINGS: Focal areas of increased activity are noted within the right shoulder, multiple bilateral ribs, right aspect of T10 and T12, throughout compressed L3 vertebra and right sacrum are compatible with metastatic disease. Focal increased activity overlying the right maxilla may represent dental disease but correlate clinically. No other significant abnormalities are noted. IMPRESSION: Multiple bony metastases as described. Focal increased activity overlying the right maxilla may represent dental disease or possibly metastasis. Electronically Signed   By: Margarette Canada M.D.   On: 10/19/2017 15:30   Ct Abdomen Pelvis W Contrast  Result Date: 10/19/2017 CLINICAL DATA:  Diffuse large B-cell lymphoma. EXAM: CT CHEST, ABDOMEN, AND PELVIS WITH CONTRAST TECHNIQUE: Multidetector CT imaging of the chest, abdomen and pelvis was performed following the standard protocol during bolus administration of intravenous contrast. CONTRAST:  166mL ISOVUE-300 IOPAMIDOL (ISOVUE-300) INJECTION 61%, 37mL ISOVUE-300 IOPAMIDOL (ISOVUE-300) INJECTION 61% COMPARISON:  Chest CT 10/03/2017 and prior CT scans 07/30/2017 FINDINGS: CT CHEST FINDINGS Cardiovascular: The heart is normal in size. No pericardial effusion. Stable mild tortuosity and ectasia of the thoracic aorta. Minimal atherosclerotic calcifications. The branch vessels are patent. No definite coronary artery calcifications. Mediastinum/Nodes: No mediastinal or hilar mass or lymphadenopathy. The esophagus is grossly normal. Lungs/Pleura: Patchy interstitial changes and underlying emphysema. Biapical pleural and parenchymal scarring. Stable small nodular lesion in the left upper lobe adjacent to the mediastinum on image number 9 which measures 5.5 mm. Persistent areas of subpleural atelectasis but no new pulmonary lesions or acute  superimposed pulmonary findings. Musculoskeletal: Much improved left chest wall mass this measured approximately 4.8 x 3.4 cm on the prior CT scan from July 30, 2017 and now measures 3.0 x 1.0 cm. Stable destructive bone lesions but no new or progressive findings involving the ribs or scapula. Small lytic lesions are noted in the right humeral head which appears stable. CT ABDOMEN PELVIS FINDINGS Hepatobiliary: No focal hepatic lesions or intrahepatic biliary dilatation. The gallbladder is mildly distended but no definite gallstones or findings for acute cholecystitis. No common bile duct dilatation. Pancreas: No mass, inflammation or ductal dilatation. Spleen: Normal size.  No focal lesions. Adrenals/Urinary Tract: The adrenal glands and left kidney are unremarkable. There is a right-sided nephrostomy tube in place. Small right renal calculi are noted. There are 2 adjacent calculi in the upper lobe right ureter measuring E total a maximum of 7.5 mm. Moderate right-sided hydroureteronephrosis. No distal ureteral calculi or bladder calculi. No bladder mass. There is a Foley catheter in the bladder. Stomach/Bowel: The stomach, duodenum, small bowel and colon are unremarkable. No acute inflammatory changes, mass lesions or obstructive findings. Vascular/Lymphatic:  The aorta and branch vessels are patent. The major venous structures are patent. Left-sided retroperitoneal nodal mass seen on the prior study the adjacent to the aorta measured 3.1 x 1.7 cm. It now has the appearance of matted low-attenuation soft tissue density measuring approximately 23 x 8 mm. No new mesenteric or retroperitoneal lymph nodes. No pelvic adenopathy. No inguinal adenopathy. Left para-aortic lymph node on the prior study measured 18 x 12 mm and is now matted soft tissue density measuring 11 x 5 mm. Reproductive: The prostate gland and seminal vesicles are unremarkable. Other: None Musculoskeletal: Diffuse osseous involvement is again  demonstrated but findings overall improved. The soft tissue mass associated with the destructive bony lesion involving the right iliac crest has resolved. The right iliac crest bone lesion appears stable. Right iliac bone lesion appears smaller and slightly calcified. The right pubic bone lesion has improved. There is healing changes, calcification and no residual soft tissue component. Stable small lytic lesions in the right femoral head. Right acetabular lesion appears stable. New line stable vertebral plana at L3 and stable appearing involvement of T10 and T12. IMPRESSION: 1. Overall improved CT appearance of the patient's lymphoma most significantly involving the osseous structures. No new or progressive bony findings. 2. Significant improvement of the retroperitoneal lymph nodes. No new or progressive findings in the abdomen or pelvis. 3. Right-sided externalized nephrostomy tube with obstructing upper ureteral calculi and associated mild to moderate hydroureteronephrosis. 4. Stable lung findings. Electronically Signed   By: Marijo Sanes M.D.   On: 10/19/2017 15:37   US Renal  Result Date: 10/16/2017 CLINICAL DATA:  Blood draining from the nephrostomy tube EXAM: RENAL / URINARY TRACT ULTRASOUND COMPLETE COMPARISON:  Fluoro spot radiographs of October 04, 2017 revealing a right nephrostomy and contrast in the right ureter. FINDINGS: Right Kidney: Length: 10.4 cm. The nephrostomy tube's pigtail can be seen within the mid renal collecting system. There is no hydronephrosis. No perinephric fluid collection is observed. The cortical echotexture is remains lower than that of the adjacent liver. Left Kidney: Length: 11.5 cm. Echogenicity within normal limits. No mass or hydronephrosis visualized. Bladder: The urinary bladder is decompressed by a Foley catheter. IMPRESSION: No etiology for the bloody nephrostomy tube drainage is observed. The proximal pigtail of the nephrostomy tube appears to lie in the mid  renal collecting system. No hydronephrosis on the right or left. Electronically Signed   By: David  Martinique M.D.   On: 10/16/2017 13:54   Ir Nephrostomy Exchange Right  Result Date: 10/04/2017 INDICATION: 56 year old with a right ureter stone and right percutaneous nephrostomy. Concern for infection. Request for a drain exchange. EXAM: RIGHT NEPHROSTOMY TUBE EXCHANGE WITH FLUOROSCOPY Physician: Stephan Minister. Anselm Pancoast, MD COMPARISON:  None. MEDICATIONS: None ANESTHESIA/SEDATION: None CONTRAST:  7mL ISOVUE-300 IOPAMIDOL (ISOVUE-300) INJECTION 61% - administered into the collecting system(s) FLUOROSCOPY TIME:  Fluoroscopy Time: 30 seconds, 6 mGy COMPLICATIONS: None immediate. PROCEDURE: The procedure was explained to the patient. The risks and benefits of the procedure were discussed and the patient's questions were addressed. Informed consent was obtained from the patient. Right flank was prepped and draped in sterile fashion. Maximal barrier sterile technique was utilized including caps, mask, sterile gowns, sterile gloves, sterile drape, hand hygiene and skin antiseptic. Contrast was injected through the nephrostomy tube. Catheter was cut and removed over a Bentson wire. New 10.2 [place] multipurpose drain was reconstituted in the renal pelvis. Contrast was injected to confirm placement. Skin was anesthetized with 1% lidocaine. Catheter was sutured to skin with  Prolene suture. Fluoroscopic images were taken and saved for this procedure. FINDINGS: Nephrostomy tube is well positioned within the right renal pelvis. Again noted is a stone in the proximal/mid right ureter. Delayed emptying of contrast distal to this stone. IMPRESSION: Successful exchange of the right percutaneous nephrostomy tube with fluoroscopy. Electronically Signed   By: Markus Daft M.D.   On: 10/04/2017 09:20     CBC Recent Labs  Lab 10/16/17 1304 10/17/17 0415 10/18/17 1805 10/19/17 0344  WBC 9.6 6.0 8.5 4.9  HGB 11.2* 9.6* 9.1* 8.9*  HCT 35.3*  30.7* 28.4* 27.8*  PLT 378 276 270 243  MCV 81.1 81.9 82.1 82.5  MCH 25.7* 25.6* 26.3 26.4  MCHC 31.7 31.3 32.0 32.0  RDW 20.5* 20.7* 20.8* 20.9*  LYMPHSABS 1.3  --  0.3*  --   MONOABS 0.5  --  0.3  --   EOSABS 0.0  --  0.0  --   BASOSABS 0.0  --  0.0  --     Chemistries  Recent Labs  Lab 10/16/17 1934 10/17/17 0415 10/18/17 1135 10/18/17 1805 10/19/17 0344 10/19/17 1503 10/20/17 0356 10/20/17 1700  NA  --  140 136 136 138 134* 138 136  K  --  2.5* 2.8* 3.4* 2.8* 3.1* 2.9* 3.3*  CL  --  107 105 104 105 102 103 100*  CO2  --  21* 19* 19* 22 22 24 23   GLUCOSE  --  73 97 98 73 77 76 117*  BUN  --  <5* <5* <5* <5* <5* <5* <5*  CREATININE  --  0.39* 0.37* 0.33* <0.30* 0.31* 0.32* 0.35*  CALCIUM  --  6.0* 5.6* 5.8* 5.7* 6.1* 6.1* 5.7*  MG 2.2 2.0 1.6*  --  1.6*  --   --   --   AST  --  21 20 19 19 19 19 20   ALT  --  21 17 17  16* 16* 14* 16*  ALKPHOS  --  94 89 89 87 90 89 93  BILITOT  --  1.4* 1.5* 1.1 0.8 1.0 1.0 1.3*   ------------------------------------------------------------------------------------------------------------------ No results for input(s): CHOL, HDL, LDLCALC, TRIG, CHOLHDL, LDLDIRECT in the last 72 hours.  No results found for: HGBA1C ------------------------------------------------------------------------------------------------------------------ No results for input(s): TSH, T4TOTAL, T3FREE, THYROIDAB in the last 72 hours.  Invalid input(s): FREET3 ------------------------------------------------------------------------------------------------------------------ No results for input(s): VITAMINB12, FOLATE, FERRITIN, TIBC, IRON, RETICCTPCT in the last 72 hours.  Coagulation profile Recent Labs  Lab 10/16/17 1304  INR 1.44    No results for input(s): DDIMER in the last 72 hours.  Cardiac Enzymes No results for input(s): CKMB, TROPONINI, MYOGLOBIN in the last 168 hours.  Invalid input(s):  CK ------------------------------------------------------------------------------------------------------------------    Component Value Date/Time   BNP 607.1 (H) 08/21/2017 2249     Roxan Hockey M.D on 10/20/2017 at 8:21 PM  Between 7am to 7pm - Pager - [phone]  After 7pm go to [email or web address] - password [email or web address]  [facility] -  Office  [phone]   Voice Recognition Viviann Spare dictation system was used to create this note, attempts have been made to correct errors. Please contact the author with questions and/or clarifications.

## 2017-10-21 LAB — COMPREHENSIVE METABOLIC PANEL
ALK PHOS: 77 U/L (ref 38–126)
ALT: 13 U/L — AB (ref 17–63)
AST: 18 U/L (ref 15–41)
Albumin: 2.3 g/dL — ABNORMAL LOW (ref 3.5–5.0)
Anion gap: 10 (ref 5–15)
CALCIUM: 5.5 mg/dL — AB (ref 8.9–10.3)
CO2: 24 mmol/L (ref 22–32)
CREATININE: 0.37 mg/dL — AB (ref 0.61–1.24)
Chloride: 103 mmol/L (ref 101–111)
Glucose, Bld: 87 mg/dL (ref 65–99)
Potassium: 2.8 mmol/L — ABNORMAL LOW (ref 3.5–5.1)
Sodium: 137 mmol/L (ref 135–145)
Total Bilirubin: 1 mg/dL (ref 0.3–1.2)
Total Protein: 4.6 g/dL — ABNORMAL LOW (ref 6.5–8.1)

## 2017-10-21 LAB — MAGNESIUM: MAGNESIUM: 1.7 mg/dL (ref 1.7–2.4)

## 2017-10-21 MED ORDER — SODIUM CHLORIDE 0.9 % IV SOLN
1.0000 g | Freq: Once | INTRAVENOUS | Status: AC
  Administered 2017-10-21: 1 g via INTRAVENOUS
  Filled 2017-10-21: qty 10

## 2017-10-21 MED ORDER — POTASSIUM CHLORIDE 10 MEQ/100ML IV SOLN
10.0000 meq | INTRAVENOUS | Status: AC
  Administered 2017-10-21 (×4): 10 meq via INTRAVENOUS
  Filled 2017-10-21 (×4): qty 100

## 2017-10-21 NOTE — Progress Notes (Signed)
Calorie Count Note  48 hour calorie count ordered. Day 1 results below.  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  Goal: Pt to meet >/= 90% of their estimated nutrition needs.  Intervention:  -Continue Calorie Count for Day 2 results (suspect poor results if PO intakes are not recorded in chart and with envelope removed from door) -Continue Ensure Enlive po TID, each supplement provides 350 kcal and 20 grams of protein   Clayton Bibles, MS, RD, [place] [facility] Pager: [phone] After Hours Pager: [phone]

## 2017-10-21 NOTE — Progress Notes (Signed)
PROGRESS NOTE    Cory Ibarra  XBJ:[id number] DOB: 15-May-1961 DOA: 10/16/2017 PCP: [name], [name]   Brief Narrative:  Jusitn Ibarra is a 56 y.o. 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  Of note, patient was recently diagnosed with pulmonary embolism and has been continued on Eliquis prior to admission.  During workup, patient was also noted to be tachycardic and was subsequently referred to the emergency department for further workup.  ED Course: In the emergency department, patient was noted to have heart rate in the low 120s.  Potassium was noted to be 2.7 with calcium of 6.4.  Most recent magnesium was noted to be 1.6.  Patient was given 2 g of magnesium in the emergency department.  Repeat magnesium level is pending.  Even electrolyte abnormalities, hospitalist service was consulted for consideration for admission  Patient continued to be seen by his oncologist during his hospitalization.  CT of the neck chest abdomen pelvis and bone scan were ordered for 10/19/2017.  He continued to have multiple electrolyte abnormalities throughout hospitalization.    Assessment & Plan:   Active Problems:   Gross hematuria   Hypocalcemia   Severe protein-calorie malnutrition (HCC)   Hypokalemia  Hypocalcemia - Patient with history of persistently low calcium - Patient given 1 g of calcium gluconate in the emergency department - Restart patient home p.o. medications to replete calcium -Patient has required repeated numerous doses of IV calcium gluconate as well as p.o. supplementation of calcium and yet his calcium remains in a critically low value - 2 g of calcium gluconate ordered for today -Agree with Dr. Clydene Laming note that likely significant electrolyte imbalances due to  malnutrition and poor p.o. intake  -repeat daily CMP  Hypokalemia - Potassium 2.7 and ED with 10 mEq of potassium given by ED physician - Potassium of 2.8 this morning -Continue IV and p.o. replacement: Potassium as well remains critically low even with repeated numerous IV and p.o. replacements -Magnesium within normal limits -Likely due to poor p.o. intake and malnutrition  Sinus tach -Intermittently tachycardic -Aggressive IV and p.o. replacement of electrolytes - Continue telemetry - Continue labetalol  History of B-cell lymphoma - He should followed by Dr. [name] - Undergoing chemotherapy as an outpatient -Appreciate patient's oncologist input -Patient encouraged to increase nutritional needs as some of his electrolyte disturbances can be linked to malnutrition  Pulmonary emboli - Patient on therapeutic anticoagulation prior to his hospital admission for recently diagnosed pulmonary emboli. -Anticoagulation restarted on 10/19/2017: No signs of bleeding currently   Objective uropathy with nephrostomy tube, hematuria - Case was discussed with urology by the ED physician.  No signs of obstruction. - Will continue to monitor closely as patient restarted anticoagulation 2 days ago      DVT prophylaxis: SCDs Code Status: DNR Family Communication: No family bedside Disposition Plan: Pending improvement in electrolytes   Consultants:   Urology phone call by emergency department physician  Palliative care  Oncology  Procedures:   None  Antimicrobials:   None   Subjective: Patient is resting watching television at time of exam.  He denies any symptoms of palpitations, chest pain, shortness of breath, weakness.  Hopeful to be able to discharge soon.  Says he is trying his best to increase his p.o. intake even though he is not currently hungry.  Objective: Vitals:  10/20/17 0534 10/20/17 1440 10/20/17 2035 10/21/17 0538  BP: 104/72 101/70 109/67 107/68    Pulse: 90 93 88 90  Resp: 18 18 18 18   Temp: (!) 97.5 F (36.4 C) 98.3 F (36.8 C) 97.9 F (36.6 C) 97.7 F (36.5 C)  TempSrc: Oral Oral Axillary Oral  SpO2: 98% 100% 100% 100%  Weight:      Height:        Intake/Output Summary (Last 24 hours) at 10/21/2017 0814 Last data filed at 10/21/2017 6063 Gross per 24 hour  Intake 2005 ml  Output 1975 ml  Net 30 ml   Filed Weights   10/16/17 1845  Weight: 60.3 kg (132 lb 15 oz)    Examination:  General exam: Cachectic, appears older than stated age Respiratory system: Clear to auscultation. Respiratory effort normal. Cardiovascular system: S1 & S2 heard, RRR. No JVD, murmurs, rubs, gallops or clicks. No pedal edema. Gastrointestinal system: Abdomen is nondistended, soft and nontender. No organomegaly or masses felt. Normal bowel sounds heard.  Right-sided nephrostomy tube in place draining clear yellow urine into nephrostomy bag Central nervous system: Alert and oriented to person place time. No focal neurological deficits. Extremities: Symmetric 5 x 5 power. Skin: No rashes, lesions or ulcers Psychiatry: mood & affect appropriate.  Questionable insight   Data Reviewed: I have personally reviewed following labs and imaging studies  CBC: Recent Labs  Lab 10/16/17 1304 10/17/17 0415 10/18/17 1805 10/19/17 0344  WBC 9.6 6.0 8.5 4.9  NEUTROABS 7.8*  --  8.0*  --   HGB 11.2* 9.6* 9.1* 8.9*  HCT 35.3* 30.7* 28.4* 27.8*  MCV 81.1 81.9 82.1 82.5  PLT 378 276 270 016   Basic Metabolic Panel: Recent Labs  Lab 10/16/17 1934 10/17/17 0415 10/18/17 1135  10/19/17 0344 10/19/17 1503 10/20/17 0356 10/20/17 1700 10/21/17 0420  NA  --  140 136   < > 138 134* 138 136 137  K  --  2.5* 2.8*   < > 2.8* 3.1* 2.9* 3.3* 2.8*  CL  --  107 105   < > 105 102 103 100* 103  CO2  --  21* 19*   < > 22 22 24 23 24   GLUCOSE  --  73 97   < > 73 77 76 117* 87  BUN  --  <5* <5*   < > <5* <5* <5* <5* <5*  CREATININE  --  0.39* 0.37*   < >  <0.30* 0.31* 0.32* 0.35* 0.37*  CALCIUM  --  6.0* 5.6*   < > 5.7* 6.1* 6.1* 5.7* 5.5*  MG 2.2 2.0 1.6*  --  1.6*  --   --   --   --    < > = values in this interval not displayed.   GFR: Estimated Creatinine Clearance: 85.8 mL/min (A) (by C-G formula based on SCr of 0.37 mg/dL (L)). Liver Function Tests: Recent Labs  Lab 10/19/17 0344 10/19/17 1503 10/20/17 0356 10/20/17 1700 10/21/17 0420  AST 19 19 19 20 18   ALT 16* 16* 14* 16* 13*  ALKPHOS 87 90 89 93 77  BILITOT 0.8 1.0 1.0 1.3* 1.0  PROT 4.7* 4.9* 4.8* 5.1* 4.6*  ALBUMIN 2.4* 2.5* 2.5* 2.6* 2.3*   No results for input(s): LIPASE, AMYLASE in the last 168 hours. No results for input(s): AMMONIA in the last 168 hours. Coagulation Profile: Recent Labs  Lab 10/16/17 1304  INR 1.44   Cardiac Enzymes: No results for input(s): CKTOTAL, CKMB, CKMBINDEX, TROPONINI  in the last 168 hours. BNP (last 3 results) No results for input(s): PROBNP in the last 8760 hours. HbA1C: No results for input(s): HGBA1C in the last 72 hours. CBG: No results for input(s): GLUCAP in the last 168 hours. Lipid Profile: No results for input(s): CHOL, HDL, LDLCALC, TRIG, CHOLHDL, LDLDIRECT in the last 72 hours. Thyroid Function Tests: No results for input(s): TSH, T4TOTAL, FREET4, T3FREE, THYROIDAB in the last 72 hours. Anemia Panel: No results for input(s): VITAMINB12, FOLATE, FERRITIN, TIBC, IRON, RETICCTPCT in the last 72 hours. Sepsis Labs: No results for input(s): PROCALCITON, LATICACIDVEN in the last 168 hours.  Recent Results (from the past 240 hour(s))  C difficile quick scan w PCR reflex     Status: None   Collection Time: 10/17/17  6:05 PM  Result Value Ref Range Status   C Diff antigen NEGATIVE NEGATIVE Final   C Diff toxin NEGATIVE NEGATIVE Final   C Diff interpretation No C. difficile detected.  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 Spinal involvement. EXAM: CT NECK WITH CONTRAST TECHNIQUE: Multidetector CT imaging of the neck was performed using the standard protocol following the bolus administration of intravenous contrast. CONTRAST:  144mL ISOVUE-300 IOPAMIDOL (ISOVUE-300) INJECTION 61% in conjunction with contrast enhanced imaging of the chest, abdomen, and pelvis reported separately. COMPARISON:  No prior neck or head CT. Chest CTA 10/03/2017 and earlier. Chest CT today reported separately. FINDINGS: Pharynx and larynx: Asymmetric medial deviation of the right vocal fold raises the possibility of right vocal cord palsy and appears to be new since October (series 5, image 71). Posterior to the right aryepiglottic fold there is a circumscribed partially round, partially triangular simple fluid density mass encompassing 10 x 22 x 14 mm (AP by transverse by CC) with subtotal effacement of the right piriform sinus. See series 5, image 61 and sagittal image 49. This is separate from the epiglottis which remains normal. There is mild adjacent linear mucosal enhancement. The lesion slightly crosses midline to the left. The epiglottis is unaffected and normal. Elsewhere the hypopharynx is normal. oropharynx and nasopharynx contours appear normal. Negative parapharyngeal and retropharyngeal spaces. Salivary glands: Sublingual space, submandibular glands and parotid glands are within normal limits. Thyroid: Negative. Lymph nodes: Bilateral cervical lymph nodes are within normal limits. A conspicuous left level IIa node measures 6-7 mm short axis (sagittal image 74). No abnormally enlarged lymph nodes. No cystic or necrotic nodes. Vascular: Right chest IJ approach porta cath. Major vascular structures in the neck and at the skullbase are patent. Limited intracranial: Negative. Visualized orbits: Negative. Mastoids and visualized paranasal sinuses: Visible paranasal sinuses are clear.  Visible tympanic cavities are clear. Mild right mastoid effusion. The left mastoids are clear. Skeleton: Lytic destruction of the condyle the right mandible in the TMJ, most apparent laterally (series 13, image 9). The right temporal fossa appears normal. Subtotal lytic destruction of the right C1 lateral mass with collapse (coronal image 58). Lytic lesion extends toward the midline in the right anterior C1 ring. 3 cm lytic lesion mildly expanding the right C2 lamina tracking to midline (series 13, image 34. No definite associated intraspinal mass (series 5, image 34). Lytic lesion of the left C3 transverse process and lamina (series 13, image 40). Subtotal lytic destruction of the C4 vertebral body with mild pathologic fracture (sagittal image  62). Bilateral C5 neural foraminal involvement suspected. Bilateral lytic lesion in the bilateral T1 posterior elements. See also chest findings reported separately. Upper chest: Chest CT today reported separately. IMPRESSION: 1. No prior head or neck CT. 2. 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 Associated pathologic fractures of the right C1 lateral mass and the C4 vertebral body. 3. No lymphadenopathy in the neck. 4. There is a 2 cm circumscribed cyst in the posterior right hypopharynx contiguous with the right AE fold which appears to be new since the CTA chest 10/03/2017. Burtis Junes this is a benign retention cyst. 5. Right mastoid effusion, likely reactive to #2. 6. See  CT Chest, Abdomen, and Pelvis today reported separately. Electronically Signed   By: Genevie Ann M.D.   On: 10/19/2017 12:18   Ct Chest W Contrast  Result Date: 10/19/2017 CLINICAL DATA:  Diffuse large B-cell lymphoma. EXAM: CT CHEST, ABDOMEN, AND PELVIS WITH CONTRAST TECHNIQUE: Multidetector CT imaging of the chest, abdomen and pelvis was performed following the standard protocol during bolus administration of intravenous contrast. CONTRAST:  139mL ISOVUE-300  IOPAMIDOL (ISOVUE-300) INJECTION 61%, 51mL ISOVUE-300 IOPAMIDOL (ISOVUE-300) INJECTION 61% COMPARISON:  Chest CT 10/03/2017 and prior CT scans 07/30/2017 FINDINGS: CT CHEST FINDINGS Cardiovascular: The heart is normal in size. No pericardial effusion. Stable mild tortuosity and ectasia of the thoracic aorta. Minimal atherosclerotic calcifications. The branch vessels are patent. No definite coronary artery calcifications. Mediastinum/Nodes: No mediastinal or hilar mass or lymphadenopathy. The esophagus is grossly normal. Lungs/Pleura: Patchy interstitial changes and underlying emphysema. Biapical pleural and parenchymal scarring. Stable small nodular lesion in the left upper lobe adjacent to the mediastinum on image number 9 which measures 5.5 mm. Persistent areas of subpleural atelectasis but no new pulmonary lesions or acute superimposed pulmonary findings. Musculoskeletal: Much improved left chest wall mass this measured approximately 4.8 x 3.4 cm on the prior CT scan from July 30, 2017 and now measures 3.0 x 1.0 cm. Stable destructive bone lesions but no new or progressive findings involving the ribs or scapula. Small lytic lesions are noted in the right humeral head which appears stable. CT ABDOMEN PELVIS FINDINGS Hepatobiliary: No focal hepatic lesions or intrahepatic biliary dilatation. The gallbladder is mildly distended but no definite gallstones or findings for acute cholecystitis. No common bile duct dilatation. Pancreas: No mass, inflammation or ductal dilatation. Spleen: Normal size.  No focal lesions. Adrenals/Urinary Tract: The adrenal glands and left kidney are unremarkable. There is a right-sided nephrostomy tube in place. Small right renal calculi are noted. There are 2 adjacent calculi in the upper lobe right ureter measuring E total a maximum of 7.5 mm. Moderate right-sided hydroureteronephrosis. No distal ureteral calculi or bladder calculi. No bladder mass. There is a Foley catheter in the  bladder. Stomach/Bowel: The stomach, duodenum, small bowel and colon are unremarkable. No acute inflammatory changes, mass lesions or obstructive findings. Vascular/Lymphatic: The aorta and branch vessels are patent. The major venous structures are patent. Left-sided retroperitoneal nodal mass seen on the prior study the adjacent to the aorta measured 3.1 x 1.7 cm. It now has the appearance of matted low-attenuation soft tissue density measuring approximately 23 x 8 mm. No new mesenteric or retroperitoneal lymph nodes. No pelvic adenopathy. No inguinal adenopathy. Left para-aortic lymph node on the prior study measured 18 x 12 mm and is now matted soft tissue density measuring 11 x 5 mm. Reproductive: The prostate gland and seminal vesicles are unremarkable. Other: None Musculoskeletal: Diffuse  osseous involvement is again demonstrated but findings overall improved. The soft tissue mass associated with the destructive bony lesion involving the right iliac crest has resolved. The right iliac crest bone lesion appears stable. Right iliac bone lesion appears smaller and slightly calcified. The right pubic bone lesion has improved. There is healing changes, calcification and no residual soft tissue component. Stable small lytic lesions in the right femoral head. Right acetabular lesion appears stable. New line stable vertebral plana at L3 and stable appearing involvement of T10 and T12. IMPRESSION: 1. Overall improved CT appearance of the patient's lymphoma most significantly involving the osseous structures. No new or progressive bony findings. 2. Significant improvement of the retroperitoneal lymph nodes. No new or progressive findings in the abdomen or pelvis. 3. Right-sided externalized nephrostomy tube with obstructing upper ureteral calculi and associated mild to moderate hydroureteronephrosis. 4. Stable lung findings. Electronically Signed   By: Marijo Sanes M.D.   On: 10/19/2017 15:37   Nm Bone Scan Whole  Body  Result Date: 10/19/2017 CLINICAL DATA:  56 year old male for restaging of diffuse large B-cell lymphoma. EXAM: NUCLEAR MEDICINE WHOLE BODY BONE SCAN TECHNIQUE: Whole body anterior and posterior images were obtained approximately 3 hours after intravenous injection of radiopharmaceutical. RADIOPHARMACEUTICALS:  21.0 mCi Technetium-39m MDP IV COMPARISON:  10/03/2017 chest CT and prior studies. No prior bone scans available for comparison. FINDINGS: Focal areas of increased activity are noted within the right shoulder, multiple bilateral ribs, right aspect of T10 and T12, throughout compressed L3 vertebra and right sacrum are compatible with metastatic disease. Focal increased activity overlying the right maxilla may represent dental disease but correlate clinically. No other significant abnormalities are noted. IMPRESSION: Multiple bony metastases as described. Focal increased activity overlying the right maxilla may represent dental disease or possibly metastasis. Electronically Signed   By: Margarette Canada M.D.   On: 10/19/2017 15:30   Ct Abdomen Pelvis W Contrast  Result Date: 10/19/2017 CLINICAL DATA:  Diffuse large B-cell lymphoma. EXAM: CT CHEST, ABDOMEN, AND PELVIS WITH CONTRAST TECHNIQUE: Multidetector CT imaging of the chest, abdomen and pelvis was performed following the standard protocol during bolus administration of intravenous contrast. CONTRAST:  179mL ISOVUE-300 IOPAMIDOL (ISOVUE-300) INJECTION 61%, 44mL ISOVUE-300 IOPAMIDOL (ISOVUE-300) INJECTION 61% COMPARISON:  Chest CT 10/03/2017 and prior CT scans 07/30/2017 FINDINGS: CT CHEST FINDINGS Cardiovascular: The heart is normal in size. No pericardial effusion. Stable mild tortuosity and ectasia of the thoracic aorta. Minimal atherosclerotic calcifications. The branch vessels are patent. No definite coronary artery calcifications. Mediastinum/Nodes: No mediastinal or hilar mass or lymphadenopathy. The esophagus is grossly normal. Lungs/Pleura:  Patchy interstitial changes and underlying emphysema. Biapical pleural and parenchymal scarring. Stable small nodular lesion in the left upper lobe adjacent to the mediastinum on image number 9 which measures 5.5 mm. Persistent areas of subpleural atelectasis but no new pulmonary lesions or acute superimposed pulmonary findings. Musculoskeletal: Much improved left chest wall mass this measured approximately 4.8 x 3.4 cm on the prior CT scan from July 30, 2017 and now measures 3.0 x 1.0 cm. Stable destructive bone lesions but no new or progressive findings involving the ribs or scapula. Small lytic lesions are noted in the right humeral head which appears stable. CT ABDOMEN PELVIS FINDINGS Hepatobiliary: No focal hepatic lesions or intrahepatic biliary dilatation. The gallbladder is mildly distended but no definite gallstones or findings for acute cholecystitis. No common bile duct dilatation. Pancreas: No mass, inflammation or ductal dilatation. Spleen: Normal size.  No focal lesions. Adrenals/Urinary Tract: The adrenal glands  and left kidney are unremarkable. There is a right-sided nephrostomy tube in place. Small right renal calculi are noted. There are 2 adjacent calculi in the upper lobe right ureter measuring E total a maximum of 7.5 mm. Moderate right-sided hydroureteronephrosis. No distal ureteral calculi or bladder calculi. No bladder mass. There is a Foley catheter in the bladder. Stomach/Bowel: The stomach, duodenum, small bowel and colon are unremarkable. No acute inflammatory changes, mass lesions or obstructive findings. Vascular/Lymphatic: The aorta and branch vessels are patent. The major venous structures are patent. Left-sided retroperitoneal nodal mass seen on the prior study the adjacent to the aorta measured 3.1 x 1.7 cm. It now has the appearance of matted low-attenuation soft tissue density measuring approximately 23 x 8 mm. No new mesenteric or retroperitoneal lymph nodes. No pelvic  adenopathy. No inguinal adenopathy. Left para-aortic lymph node on the prior study measured 18 x 12 mm and is now matted soft tissue density measuring 11 x 5 mm. Reproductive: The prostate gland and seminal vesicles are unremarkable. Other: None Musculoskeletal: Diffuse osseous involvement is again demonstrated but findings overall improved. The soft tissue mass associated with the destructive bony lesion involving the right iliac crest has resolved. The right iliac crest bone lesion appears stable. Right iliac bone lesion appears smaller and slightly calcified. The right pubic bone lesion has improved. There is healing changes, calcification and no residual soft tissue component. Stable small lytic lesions in the right femoral head. Right acetabular lesion appears stable. New line stable vertebral plana at L3 and stable appearing involvement of T10 and T12. IMPRESSION: 1. Overall improved CT appearance of the patient's lymphoma most significantly involving the osseous structures. No new or progressive bony findings. 2. Significant improvement of the retroperitoneal lymph nodes. No new or progressive findings in the abdomen or pelvis. 3. Right-sided externalized nephrostomy tube with obstructing upper ureteral calculi and associated mild to moderate hydroureteronephrosis. 4. Stable lung findings. Electronically Signed   By: Marijo Sanes M.D.   On: 10/19/2017 15:37        Scheduled Meds: . apixaban  5 mg Oral BID  . calcium carbonate  1,000 mg Oral TID  . dronabinol  5 mg Oral QAC lunch  . feeding supplement (ENSURE ENLIVE)  237 mL Oral TID BM  . magnesium oxide  400 mg Oral BID  . mirtazapine  15 mg Oral QHS  . multivitamin with minerals  1 tablet Oral Daily  . OLANZapine zydis  2.5 mg Oral QHS  . oxyCODONE  10 mg Oral Q12H  . pantoprazole  40 mg Oral Q lunch  . potassium & sodium phosphates  1 packet Oral TID AC & HS  . potassium chloride  40 mEq Oral Once  . predniSONE  20 mg Oral Q breakfast    . sulfamethoxazole-trimethoprim  2 tablet Oral Once per day on Mon Wed Fri   Continuous Infusions: . sodium chloride 75 mL/hr at 10/20/17 2209  . calcium gluconate 1 g (10/21/17 0725)     LOS: 5 days    Time spent: 30 minutes    Loretha Stapler, MD [facility] Pager [phone]  If 7PM-7AM, please contact night-coverage [email or web address] Password [email or web address] 10/21/2017, 8:14 AM

## 2017-10-22 LAB — COMPREHENSIVE METABOLIC PANEL
ALBUMIN: 2.4 g/dL — AB (ref 3.5–5.0)
ALK PHOS: 80 U/L (ref 38–126)
ALK PHOS: 90 U/L (ref 38–126)
ALT: 15 U/L — AB (ref 17–63)
ALT: 14 U/L — ABNORMAL LOW (ref 17–63)
ANION GAP: 9 (ref 5–15)
ANION GAP: 7 (ref 5–15)
AST: 17 U/L (ref 15–41)
AST: 19 U/L (ref 15–41)
Albumin: 2.4 g/dL — ABNORMAL LOW (ref 3.5–5.0)
BILIRUBIN TOTAL: 1.1 mg/dL (ref 0.3–1.2)
BILIRUBIN TOTAL: 0.9 mg/dL (ref 0.3–1.2)
BUN: <5 mg/dL — ABNORMAL LOW (ref 6–20)
BUN: <5 mg/dL — ABNORMAL LOW (ref 6–20)
CALCIUM: 5.7 mg/dL — AB (ref 8.9–10.3)
CALCIUM: 6.3 mg/dL — AB (ref 8.9–10.3)
CO2: 23 mmol/L (ref 22–32)
CO2: 24 mmol/L (ref 22–32)
Chloride: 105 mmol/L (ref 101–111)
Chloride: 106 mmol/L (ref 101–111)
Creatinine, Ser: <0.3 mg/dL — ABNORMAL LOW (ref 0.61–1.24)
GLUCOSE: 86 mg/dL (ref 65–99)
Glucose, Bld: 119 mg/dL — ABNORMAL HIGH (ref 65–99)
POTASSIUM: 4.7 mmol/L (ref 3.5–5.1)
Potassium: 2.8 mmol/L — ABNORMAL LOW (ref 3.5–5.1)
SODIUM: 137 mmol/L (ref 135–145)
Sodium: 137 mmol/L (ref 135–145)
TOTAL PROTEIN: 4.7 g/dL — AB (ref 6.5–8.1)
TOTAL PROTEIN: 4.8 g/dL — AB (ref 6.5–8.1)

## 2017-10-22 LAB — MAGNESIUM
MAGNESIUM: 2.2 mg/dL (ref 1.7–2.4)
Magnesium: 1.6 mg/dL — ABNORMAL LOW (ref 1.7–2.4)

## 2017-10-22 LAB — PHOSPHORUS: PHOSPHORUS: 1.9 mg/dL — AB (ref 2.5–4.6)

## 2017-10-22 MED ORDER — SODIUM CHLORIDE 0.9 % IV SOLN
2.0000 g | Freq: Once | INTRAVENOUS | Status: AC
  Administered 2017-10-22: 2 g via INTRAVENOUS
  Filled 2017-10-22: qty 20

## 2017-10-22 MED ORDER — POTASSIUM CHLORIDE 10 MEQ/50ML IV SOLN
10.0000 meq | INTRAVENOUS | Status: AC
  Administered 2017-10-22 (×6): 10 meq via INTRAVENOUS
  Filled 2017-10-22 (×6): qty 50

## 2017-10-22 MED ORDER — MAGNESIUM SULFATE 4 GM/100ML IV SOLN
4.0000 g | Freq: Once | INTRAVENOUS | Status: AC
  Administered 2017-10-22: 4 g via INTRAVENOUS
  Filled 2017-10-22: qty 100

## 2017-10-22 MED ORDER — VITAMIN D (ERGOCALCIFEROL) 1.25 MG (50000 UNIT) PO CAPS
50000.0000 [IU] | ORAL_CAPSULE | ORAL | Status: DC
  Administered 2017-10-22 – 2017-11-12 (×4): 50000 [IU] via ORAL
  Filled 2017-10-22 (×4): qty 1

## 2017-10-22 NOTE — Progress Notes (Signed)
CRITICAL VALUE ALERT   Critical Value: Calcium 5.7  Date & Time Notied:  [id number]  Provider Notified: [place]  Orders Received/Actions taken: no

## 2017-10-22 NOTE — Progress Notes (Signed)
CRITICAL VALUE ALERT  Critical Value:  Calcium 6.3  Date & Time Notied:  10/22/2017 at 1902  Provider Notified: [place]   Orders Received/Actions taken: Night coverage notified.

## 2017-10-22 NOTE — Progress Notes (Addendum)
LCSW following for return to facility.  LCSW spoke with brother and POA, Gershon Mussel. Patient will return to [facility] at [place].   Tom discussed concerns with patient not eating and drinking recommended amounts and asked that LCSW reiterate the importance for him to eat and drink the appropriate amounts.   LCSW attempted to reach POA this am by phone.   LCSW had message from [facility] Friday evening.   LCSW was able to reach other contact, patients brother, Otto Felkins. He stated he would try to reach [facility] and have him contact me.   PLAN Return to [facility], [place]

## 2017-10-22 NOTE — Progress Notes (Signed)
PROGRESS NOTE    Cory Ibarra  ZOX:[id number] DOB: 1961/04/04 DOA: 10/16/2017 PCP: [name], [name]   Brief Narrative:  Cory Ibarra is a 56 y.o. 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  Of note, patient was recently diagnosed with pulmonary embolism and has been continued on Eliquis prior to admission.  During workup, patient was also noted to be tachycardic and was subsequently referred to the emergency department for further workup.  ED Course: In the emergency department, patient was noted to have heart rate in the low 120s.  Potassium was noted to be 2.7 with calcium of 6.4.  Most recent magnesium was noted to be 1.6.  Patient was given 2 g of magnesium in the emergency department.  Repeat magnesium level is pending.  Even electrolyte abnormalities, hospitalist service was consulted for consideration for admission  Patient continued to be seen by his oncologist during his hospitalization.  CT of the neck chest abdomen pelvis and bone scan were ordered for 10/19/2017.  He continued to have multiple electrolyte abnormalities throughout hospitalization.    Assessment & Plan:   Active Problems:   Gross hematuria   Hypocalcemia   Severe protein-calorie malnutrition (HCC)   Hypokalemia  Hypocalcemia - Patient has significantly low calcium again this morning despite numerous IV doses of calcium gluconate given yesterday -We will give 2 g calcium gluconate IV today repeat BMP in afternoon and most likely give an additional 2 g of IV calcium gluconate this afternoon - Restart patient home p.o. medications to replete calcium -Vitamin D ordered for today -Continue p.o. supplementation -Agree with Dr. Clydene Laming note that likely significant electrolyte imbalances due to  malnutrition and poor p.o. intake  -repeat daily CMP  Hypokalemia -Once again potassium is low this morning despite numerous IV potassium replacement doses -We will repeat IV potassium runs x6 -Repeat BMP in the afternoon -Magnesium given prior to potassium -Likely due to poor p.o. intake and malnutrition  Severe malnutrition - Calorie count in progress - registered dietitian consulted -Patient encouraged to attempt to drink Ensure at least 3 times a day  Sinus tach -Intermittently tachycardic -Aggressive IV and p.o. replacement of electrolytes - Continue telemetry - Continue labetalol  History of B-cell lymphoma - He should followed by Dr. [name] - Undergoing chemotherapy as an outpatient -Appreciate patient's oncologist input -Patient encouraged to increase nutritional needs as some of his electrolyte disturbances can be linked to malnutrition  Pulmonary emboli - Patient on therapeutic anticoagulation prior to his hospital admission for recently diagnosed pulmonary emboli. -Anticoagulation restarted on 10/19/2017: No signs of bleeding currently   Objective uropathy with nephrostomy tube, hematuria -Anticoagulation initially Stopped at time of admission due to hematuria but restarted 3 days ago      DVT prophylaxis: SCDs Code Status: DNR Family Communication: No family bedside Disposition Plan: Pending improvement in electrolytes   Consultants:   Urology phone call by emergency department physician  Palliative care  Oncology  Procedures:   None  Antimicrobials:   None   Subjective: Patient watching television at time of exam.  He reports that he will try to sit in his chair this afternoon.  Otherwise voices no new concerns or complaints.  No overnight events noted  Objective: Vitals:   10/21/17 0538 10/21/17 1440 10/21/17 2029 10/22/17 0513  BP: 107/68 105/77 105/61 106/66  Pulse: 90 (!)  109 83 91  Resp: 18 18 18 17   Temp: 97.7 F (36.5 C) 97.8  F (36.6 C) 97.6 F (36.4 C) 97.9 F (36.6 C)  TempSrc: Oral Oral Oral Oral  SpO2: 100% 100% 100% 100%  Weight:      Height:        Intake/Output Summary (Last 24 hours) at 10/22/2017 1329 Last data filed at 10/22/2017 0835 Gross per 24 hour  Intake 1650 ml  Output 1700 ml  Net -50 ml   Filed Weights   10/16/17 1845  Weight: 60.3 kg (132 lb 15 oz)    Examination:  Physical Exam  Constitutional:  Cachectic  Cardiovascular: Normal rate, regular rhythm, normal heart sounds and intact distal pulses.  Pulmonary/Chest: Effort normal and breath sounds normal. No respiratory distress. He has no wheezes. He has no rales.  Abdominal: Soft. Bowel sounds are normal. He exhibits no distension. There is no tenderness.  Neurological: He is alert.  Skin: Skin is warm and dry.  Psychiatric: Mood normal.      Data Reviewed: I have personally reviewed following labs and imaging studies  CBC: Recent Labs  Lab 10/16/17 1304 10/17/17 0415 10/18/17 1805 10/19/17 0344  WBC 9.6 6.0 8.5 4.9  NEUTROABS 7.8*  --  8.0*  --   HGB 11.2* 9.6* 9.1* 8.9*  HCT 35.3* 30.7* 28.4* 27.8*  MCV 81.1 81.9 82.1 82.5  PLT 378 276 270 161   Basic Metabolic Panel: Recent Labs  Lab 10/17/17 0415 10/18/17 1135  10/19/17 0344 10/19/17 1503 10/20/17 0356 10/20/17 1700 10/21/17 0420 10/22/17 0317  NA 140 136   < > 138 134* 138 136 137 137  K 2.5* 2.8*   < > 2.8* 3.1* 2.9* 3.3* 2.8* 2.8*  CL 107 105   < > 105 102 103 100* 103 105  CO2 21* 19*   < > 22 22 24 23 24 23   GLUCOSE 73 97   < > 73 77 76 117* 87 86  BUN <5* <5*   < > <5* <5* <5* <5* <5* <5*  CREATININE 0.39* 0.37*   < > <0.30* 0.31* 0.32* 0.35* 0.37* <0.30*  CALCIUM 6.0* 5.6*   < > 5.7* 6.1* 6.1* 5.7* 5.5* 5.7*  MG 2.0 1.6*  --  1.6*  --   --   --  1.7 1.6*   < > = values in this interval not displayed.   GFR: CrCl cannot be calculated (This lab value cannot be used to calculate CrCl because it is not a number: <0.30). Liver Function  Tests: Recent Labs  Lab 10/19/17 1503 10/20/17 0356 10/20/17 1700 10/21/17 0420 10/22/17 0317  AST 19 19 20 18 17   ALT 16* 14* 16* 13* 15*  ALKPHOS 90 89 93 77 80  BILITOT 1.0 1.0 1.3* 1.0 1.1  PROT 4.9* 4.8* 5.1* 4.6* 4.7*  ALBUMIN 2.5* 2.5* 2.6* 2.3* 2.4*   No results for input(s): LIPASE, AMYLASE in the last 168 hours. No results for input(s): AMMONIA in the last 168 hours. Coagulation Profile: Recent Labs  Lab 10/16/17 1304  INR 1.44   Cardiac Enzymes: No results for input(s): CKTOTAL, CKMB, CKMBINDEX, TROPONINI in the last 168 hours. BNP (last 3 results) No results for input(s): PROBNP in the last 8760 hours. HbA1C: No results for input(s): HGBA1C in the last 72 hours. CBG: No results for input(s): GLUCAP in the last 168 hours. Lipid Profile: No results for input(s): CHOL, HDL, LDLCALC, TRIG, CHOLHDL, LDLDIRECT in the last 72 hours.  Thyroid Function Tests: No results for input(s): TSH, T4TOTAL, FREET4, T3FREE, THYROIDAB in the last 72 hours. Anemia Panel: No results for input(s): VITAMINB12, FOLATE, FERRITIN, TIBC, IRON, RETICCTPCT in the last 72 hours. Sepsis Labs: No results for input(s): PROCALCITON, LATICACIDVEN in the last 168 hours.  Recent Results (from the past 240 hour(s))  C difficile quick scan w PCR reflex     Status: None   Collection Time: 10/17/17  6:05 PM  Result Value Ref Range Status   C Diff antigen NEGATIVE NEGATIVE Final   C Diff toxin NEGATIVE NEGATIVE Final   C Diff interpretation No C. difficile detected.  Final         Radiology Studies: No results found.      Scheduled Meds: . apixaban  5 mg Oral BID  . calcium carbonate  1,000 mg Oral TID  . dronabinol  5 mg Oral QAC lunch  . feeding supplement (ENSURE ENLIVE)  237 mL Oral TID BM  . magnesium oxide  400 mg Oral BID  . mirtazapine  15 mg Oral QHS  . multivitamin with minerals  1 tablet Oral Daily  . OLANZapine zydis  2.5 mg Oral QHS  . oxyCODONE  10 mg Oral Q12H  .  pantoprazole  40 mg Oral Q lunch  . potassium & sodium phosphates  1 packet Oral TID AC & HS  . predniSONE  20 mg Oral Q breakfast  . sulfamethoxazole-trimethoprim  2 tablet Oral Once per day on Mon Wed Fri  . Vitamin D (Ergocalciferol)  50,000 Units Oral Q Mon   Continuous Infusions: . sodium chloride 75 mL/hr at 10/22/17 0315  . potassium chloride 10 mEq (10/22/17 1326)     LOS: 6 days    Time spent: 30 minutes    Loretha Stapler, MD [facility] Pager [phone]  If 7PM-7AM, please contact night-coverage [email or web address] Password [email or web address] 10/22/2017, 1:29 PM

## 2017-10-22 NOTE — Progress Notes (Signed)
This shift Critical lab value calcium 5.7 notification from lab. Attending  Notified x 2. No new orders at this time, receiving RN notified.

## 2017-10-22 NOTE — Progress Notes (Signed)
IP PROGRESS NOTE  Subjective:  Patient continues to have significant electrolyte abnormalities.  For the intake appears to be gradually improving.  Patient denies any new symptoms.  Objective: Vital signs in last 24 hours: Blood pressure 104/63, pulse 94, temperature 98 F (36.7 C), temperature source Oral, resp. rate 13, height 6\' 2"  (1.88 m), weight 132 lb 15 oz (60.3 kg), SpO2 99 %.  Intake/Output from previous day: 11/18 0701 - 11/19 0700 In: [place] [P.O.:120; I.V.:1650] Out: 1250 [Urine:650; Drains:600]  Physical Exam:  Patient is alert, awake, oriented x3.  No acute distress. HEENT: Muscle wasting, no oral mucosal lesions. Lungs: Clear to auscultation bilaterally Cardiac: S1/S2, regular, no murmurs Abdomen: Soft, nontender nondistended.  Bowel sounds are normoactive. Extremities: No lower extremity edema Portacath/PICC-without erythema.  Right nephrostomy in place draining clear yellow urine.   Lab Results: No results for input(s): WBC, HGB, HCT, PLT in the last 72 hours.  BMET Recent Labs    10/21/17 0420 10/22/17 0317  NA 137 137  K 2.8* 2.8*  CL 103 105  CO2 24 23  GLUCOSE 87 86  BUN <5* <5*  CREATININE 0.37* <0.30*  CALCIUM 5.5* 5.7*    Lab Results  Component Value Date   CEA1 0.7 06/28/2017    Studies/Results: No results found.  Medications: I have reviewed the patient's current medications.  Assessment/Plan: 56 y.o. 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  Severe calorie/protein malnutrition and remarkable and persistent electrolyte abnormalities.  Overall, patient continues to require significant amount of medical care.  He has recurrent hospitalizations for  the previously described reasons.  Today, I have discussed the difficult time that we are having keeping him out of the hospital as well as the high risk of continuation of systemic therapy for his malignancy.  It is hard to withhold therapy from potentially curable cancer, and patient is insistent on continuing attempting to treat him with systemic chemotherapy.  **Lymphoma: We will reassess disease with CT of the chest/abdomen/pelvis and bone scan in lieu of obtaining PET/CT as patient remains hospitalized at this time.  If disease is responding to therapy, we do have better reason to continue attempting treatments.  If there is progressive disease, patient needs to be transitioned to supportive care at this time as he is unlikely to tolerate the second lines of chemotherapy for the lymphoma indication.  Restaging imaging with CT of the neck, chest, abdomen, pelvis and bone scan demonstrated positive response to previous therapy even with reduced intensity of the second cycle of chemo and delay in the administration.Marland Kitchen --I would like to proceed with additional chemotherapy treatments.  Timing of initiation will depend on potential of the patient to leave the hospital in the next few days.  If he is expected to be still in the hospital through Thanksgiving, I would consider offering him a transfer to [facility] with treatment on Tuesday or Wednesday if deemed appropriate by the primary team.  **Electrolytes/Nutrition: Severe mixed calorie/protein malnutrition with a good portion of it having to do with complete lack of appetite and effort on the patient's behalf to improve his nutrition.  I have discussed alternative of enteral nutrition via nasogastric or percutaneous gastric tube versus parenteral nutrition through the vein.  In the context of recurrent infections, parenteral nutrition will have significant  risk of causing severe infectious complications including fungal infections and I do not advocate for total  parenteral nutrition at this time.  Patient is very apprehensive about tube feeds and promises to make himself eat more at this time. --Recommend continued follow-up by nutrition services with calorie counts --Continue electrolyte replacement therapy by IV or by mouth as needed based on his daily labs.  **Disposition: Discharged from the hospital discretion of the primary treating service.  Whether patient returns back to skilled nursing facility or home will depend on opinion of physical/occupational therapy and patient's wishes.    LOS: 6 days   Ardath Sax, MD   10/22/2017, 6:11 PM

## 2017-10-22 NOTE — Progress Notes (Signed)
Calorie Count Note  48 hour calorie count ordered. Day 2 results below.  Diet: regular Supplements: Ensure Enlive po TID, each supplement provides 350 kcal and 20 grams of protein  11/17: Breakfast: 210 kcal, 3g protein Lunch: not ordered Dinner: 175 kcal, 7g protein Supplements: 1/4 of an Ensure ~85 kcal, 5g protein  Estimated Nutritional Needs:  Kcal:  2150-2350 Protein:  115-125g  Total intake: 470 kcal (21% of minimum estimated needs)  15g protein (13% of minimum estimated needs)  Nutrition Dx: Severe Malnutrition related to cancer and cancer related treatments, chronic illness as evidenced by percent weight loss, energy intake < or equal to 75% for > or equal to 1 month, severe fat depletion, moderate muscle depletion.  Goal: Pt to meet >/= 90% of their estimated nutrition needs.  Intervention:  -Continue Ensure Enlive po TID, each supplement provides 350 kcal and 20 grams of protein  PO intakes are worsening. Noted plan for palliative care to follow patient at discharge.   RD encouraged intakes of supplements AT LEAST. Pt states he will try but RD does not feel confident pt will comply.    Clayton Bibles, MS, RD, [facility] Pager: [phone] After Hours Pager: [phone]

## 2017-10-22 NOTE — Progress Notes (Signed)
Date: October 22, 2017 Rhonda Davis, BSN, RN3, CCM  [phone] Chart and notes review for patient progress and needs. Will follow for case management and discharge needs. Next review date: [id number] 

## 2017-10-23 LAB — COMPREHENSIVE METABOLIC PANEL
ALBUMIN: 2.5 g/dL — AB (ref 3.5–5.0)
ALT: 14 U/L — ABNORMAL LOW (ref 17–63)
AST: 18 U/L (ref 15–41)
Alkaline Phosphatase: 92 U/L (ref 38–126)
Anion gap: 8 (ref 5–15)
CHLORIDE: 101 mmol/L (ref 101–111)
CO2: 26 mmol/L (ref 22–32)
Calcium: 6.6 mg/dL — ABNORMAL LOW (ref 8.9–10.3)
Creatinine, Ser: <0.3 mg/dL — ABNORMAL LOW (ref 0.61–1.24)
GLUCOSE: 147 mg/dL — AB (ref 65–99)
POTASSIUM: 4 mmol/L (ref 3.5–5.1)
SODIUM: 135 mmol/L (ref 135–145)
TOTAL PROTEIN: 5.1 g/dL — AB (ref 6.5–8.1)
Total Bilirubin: 0.6 mg/dL (ref 0.3–1.2)

## 2017-10-23 LAB — BASIC METABOLIC PANEL
ANION GAP: 8 (ref 5–15)
CHLORIDE: 104 mmol/L (ref 101–111)
CO2: 24 mmol/L (ref 22–32)
Calcium: 5.8 mg/dL — CL (ref 8.9–10.3)
Creatinine, Ser: <0.3 mg/dL — ABNORMAL LOW (ref 0.61–1.24)
Glucose, Bld: 80 mg/dL (ref 65–99)
POTASSIUM: 3.3 mmol/L — AB (ref 3.5–5.1)
SODIUM: 136 mmol/L (ref 135–145)

## 2017-10-23 LAB — MAGNESIUM: MAGNESIUM: 1.8 mg/dL (ref 1.7–2.4)

## 2017-10-23 LAB — PHOSPHORUS: PHOSPHORUS: 1.4 mg/dL — AB (ref 2.5–4.6)

## 2017-10-23 MED ORDER — SODIUM CHLORIDE 0.9 % IV SOLN
1.0000 g | Freq: Once | INTRAVENOUS | Status: DC
  Administered 2017-10-23: 1 g via INTRAVENOUS
  Filled 2017-10-23: qty 10

## 2017-10-23 MED ORDER — POTASSIUM PHOSPHATES 15 MMOLE/5ML IV SOLN
30.0000 mmol | Freq: Once | INTRAVENOUS | Status: AC
  Administered 2017-10-23: 30 mmol via INTRAVENOUS
  Filled 2017-10-23: qty 10

## 2017-10-23 MED ORDER — SODIUM CHLORIDE 0.9 % IV SOLN
2.0000 g | Freq: Once | INTRAVENOUS | Status: AC
  Administered 2017-10-23: 2 g via INTRAVENOUS
  Filled 2017-10-23: qty 20

## 2017-10-23 MED ORDER — ADULT MULTIVITAMIN LIQUID CH
15.0000 mL | Freq: Every day | ORAL | Status: DC
  Administered 2017-10-23 – 2017-11-14 (×23): 15 mL via ORAL
  Filled 2017-10-23 (×23): qty 15

## 2017-10-23 MED ORDER — MAGNESIUM SULFATE 2 GM/50ML IV SOLN
2.0000 g | Freq: Once | INTRAVENOUS | Status: AC
  Administered 2017-10-23: 2 g via INTRAVENOUS
  Filled 2017-10-23: qty 50

## 2017-10-23 NOTE — Progress Notes (Signed)
CRITICAL VALUE ALERT  Critical Value:  Calcium 5.8  Date & Time Notied: 10/23/2017 0855  Provider Notified: Adair Patter  Orders Received/Actions taken: orders received.

## 2017-10-23 NOTE — Progress Notes (Signed)
PROGRESS NOTE    Cory Ibarra  NWG:[id number] DOB: July 21, 1961 DOA: 10/16/2017 PCP: [name], [name]   Brief Narrative:  Cory Ibarra is a 56 y.o. 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  Of note, patient was recently diagnosed with pulmonary embolism and has been continued on Eliquis prior to admission.  During workup, patient was also noted to be tachycardic and was subsequently referred to the emergency department for further workup.  ED Course: In the emergency department, patient was noted to have heart rate in the low 120s.  Potassium was noted to be 2.7 with calcium of 6.4.  Most recent magnesium was noted to be 1.6.  Patient was given 2 g of magnesium in the emergency department.  Repeat magnesium level is pending.  Even electrolyte abnormalities, hospitalist service was consulted for consideration for admission  Patient continued to be seen by his oncologist during his hospitalization.  CT of the neck chest abdomen pelvis and bone scan were ordered for 10/19/2017.  He continued to have multiple electrolyte abnormalities throughout hospitalization.    Assessment & Plan:   Active Problems:   Gross hematuria   Hypocalcemia   Severe protein-calorie malnutrition (HCC)   Hypokalemia  Hypocalcemia -Still low calcium this morning despite aggressive calcium replacement yesterday -Significantly poor p.o. intake -Vitamin D ordered for today -Continue p.o. supplementation -3 g of calcium gluconate ordered for today -Patient states he will attempt to increase his p.o. intake today  Hypokalemia -Aggressive IV replacement ordered for today  -Likely due to poor p.o. intake and malnutrition  Severe malnutrition -Note in chart states patient refused Ensure every time it  was offered to him yesterday -Per calorie count patient only took in 20% to 25% of meals twice yesterday -Patient promises today he will attempt to increase his intake in hopes of starting to increase his appetite  Sinus tach -Review telemetry -Still intermittently tachycardic  History of B-cell lymphoma - He should followed by Dr. [name] -Repeat scans from last week showing improvement in disease -Per Dr.Perlov's note if patient is still hospitalized over the holiday this week he may be transitioned to [facility] for chemotherapy  Pulmonary emboli -Eliquis restarted on 10/19/2017   Objective uropathy with nephrostomy tube, hematuria -A continue to monitor for hematuria given that anticoagulation was started last week      DVT prophylaxis: SCDs Code Status: DNR Family Communication: No family bedside Disposition Plan: Pending improvement in electrolytes   Consultants:   Urology phone call by emergency department physician  Palliative care  Oncology  Procedures:   None  Antimicrobials:   None   Subjective: Patient in good spirits this morning.  He says that he did eat well yesterday.  Of note calorie count showing only 20% ingestion of 2 meals yesterday.  He does deny taking any Ensure.  He says today he will try to increase his p.o. intake  Objective: Vitals:   10/22/17 0513 10/22/17 1346 10/22/17 2155 10/23/17 0534  BP: 106/66 104/63 110/66 95/68  Pulse: 91 94 85 (!) 104  Resp: 17 13 18 18   Temp: 97.9 F (36.6 C) 98 F (36.7 C) (!) 97.5 F (36.4 C) 98 F (36.7 C)  TempSrc: Oral Oral Oral Oral  SpO2: 100% 99% 100% 99%  Weight:      Height:  Intake/Output Summary (Last 24 hours) at 10/23/2017 1249 Last data filed at 10/23/2017 1135 Gross per 24 hour  Intake 2506.25 ml  Output 2625 ml  Net -118.75 ml   Filed Weights   10/16/17 1845  Weight: 60.3 kg (132 lb 15 oz)    Examination:  Physical Exam  Constitutional: No distress.  Cachectic    Cardiovascular: Normal rate, regular rhythm, normal heart sounds and intact distal pulses.  Pulmonary/Chest: Effort normal and breath sounds normal. No respiratory distress. He has no wheezes. He has no rales.  Abdominal: Soft. Bowel sounds are normal. He exhibits no distension. There is no tenderness.  Neurological: He is alert.  Skin: Skin is warm and dry.  Psychiatric: Mood and affect normal.      Data Reviewed: I have personally reviewed following labs and imaging studies  CBC: Recent Labs  Lab 10/16/17 1304 10/17/17 0415 10/18/17 1805 10/19/17 0344  WBC 9.6 6.0 8.5 4.9  NEUTROABS 7.8*  --  8.0*  --   HGB 11.2* 9.6* 9.1* 8.9*  HCT 35.3* 30.7* 28.4* 27.8*  MCV 81.1 81.9 82.1 82.5  PLT 378 276 270 824   Basic Metabolic Panel: Recent Labs  Lab 10/19/17 0344  10/20/17 1700 10/21/17 0420 10/22/17 0317 10/22/17 1730 10/23/17 0758  NA 138   < > 136 137 137 137 136  K 2.8*   < > 3.3* 2.8* 2.8* 4.7 3.3*  CL 105   < > 100* 103 105 106 104  CO2 22   < > 23 24 23 24 24   GLUCOSE 73   < > 117* 87 86 119* 80  BUN <5*   < > <5* <5* <5* <5* <5*  CREATININE <0.30*   < > 0.35* 0.37* <0.30* <0.30* <0.30*  CALCIUM 5.7*   < > 5.7* 5.5* 5.7* 6.3* 5.8*  MG 1.6*  --   --  1.7 1.6* 2.2 1.8  PHOS  --   --   --   --   --  1.9* 1.4*   < > = values in this interval not displayed.   GFR: CrCl cannot be calculated (This lab value cannot be used to calculate CrCl because it is not a number: <0.30). Liver Function Tests: Recent Labs  Lab 10/20/17 0356 10/20/17 1700 10/21/17 0420 10/22/17 0317 10/22/17 1730  AST 19 20 18 17 19   ALT 14* 16* 13* 15* 14*  ALKPHOS 89 93 77 80 90  BILITOT 1.0 1.3* 1.0 1.1 0.9  PROT 4.8* 5.1* 4.6* 4.7* 4.8*  ALBUMIN 2.5* 2.6* 2.3* 2.4* 2.4*   No results for input(s): LIPASE, AMYLASE in the last 168 hours. No results for input(s): AMMONIA in the last 168 hours. Coagulation Profile: Recent Labs  Lab 10/16/17 1304  INR 1.44   Cardiac Enzymes: No  results for input(s): CKTOTAL, CKMB, CKMBINDEX, TROPONINI in the last 168 hours. BNP (last 3 results) No results for input(s): PROBNP in the last 8760 hours. HbA1C: No results for input(s): HGBA1C in the last 72 hours. CBG: No results for input(s): GLUCAP in the last 168 hours. Lipid Profile: No results for input(s): CHOL, HDL, LDLCALC, TRIG, CHOLHDL, LDLDIRECT in the last 72 hours. Thyroid Function Tests: No results for input(s): TSH, T4TOTAL, FREET4, T3FREE, THYROIDAB in the last 72 hours. Anemia Panel: No results for input(s): VITAMINB12, FOLATE, FERRITIN, TIBC, IRON, RETICCTPCT in the last 72 hours. Sepsis Labs: No results for input(s): PROCALCITON, LATICACIDVEN in the last 168 hours.  Recent Results (from the past 240 hour(s))  C difficile quick scan w PCR reflex     Status: None   Collection Time: 10/17/17  6:05 PM  Result Value Ref Range Status   C Diff antigen NEGATIVE NEGATIVE Final   C Diff toxin NEGATIVE NEGATIVE Final   C Diff interpretation No C. difficile detected.  Final         Radiology Studies: No results found.      Scheduled Meds: . apixaban  5 mg Oral BID  . calcium carbonate  1,000 mg Oral TID  . dronabinol  5 mg Oral QAC lunch  . feeding supplement (ENSURE ENLIVE)  237 mL Oral TID BM  . magnesium oxide  400 mg Oral BID  . mirtazapine  15 mg Oral QHS  . multivitamin  15 mL Oral Daily  . OLANZapine zydis  2.5 mg Oral QHS  . oxyCODONE  10 mg Oral Q12H  . pantoprazole  40 mg Oral Q lunch  . potassium & sodium phosphates  1 packet Oral TID AC & HS  . predniSONE  20 mg Oral Q breakfast  . sulfamethoxazole-trimethoprim  2 tablet Oral Once per day on Mon Wed Fri  . Vitamin D (Ergocalciferol)  50,000 Units Oral Q Mon   Continuous Infusions: . sodium chloride 75 mL/hr at 10/23/17 0910  . potassium PHOSPHATE IVPB (mmol)       LOS: 7 days    Time spent: 25 minutes    Loretha Stapler, MD [facility] Pager [phone]  If 7PM-7AM,  please contact night-coverage [email or web address] Password [facility] 10/23/2017, 12:49 PM

## 2017-10-24 LAB — COMPREHENSIVE METABOLIC PANEL
ALBUMIN: 2.5 g/dL — AB (ref 3.5–5.0)
ALK PHOS: 88 U/L (ref 38–126)
ALK PHOS: 91 U/L (ref 38–126)
ALT: 14 U/L — AB (ref 17–63)
ALT: 15 U/L — ABNORMAL LOW (ref 17–63)
ANION GAP: 7 (ref 5–15)
AST: 18 U/L (ref 15–41)
AST: 19 U/L (ref 15–41)
Albumin: 2.4 g/dL — ABNORMAL LOW (ref 3.5–5.0)
Anion gap: 9 (ref 5–15)
BILIRUBIN TOTAL: 1 mg/dL (ref 0.3–1.2)
BILIRUBIN TOTAL: 0.8 mg/dL (ref 0.3–1.2)
CALCIUM: 7.3 mg/dL — AB (ref 8.9–10.3)
CHLORIDE: 104 mmol/L (ref 101–111)
CO2: 27 mmol/L (ref 22–32)
CO2: 27 mmol/L (ref 22–32)
Calcium: 6.5 mg/dL — ABNORMAL LOW (ref 8.9–10.3)
Chloride: 101 mmol/L (ref 101–111)
Creatinine, Ser: <0.3 mg/dL — ABNORMAL LOW (ref 0.61–1.24)
GLUCOSE: 94 mg/dL (ref 65–99)
GLUCOSE: 123 mg/dL — AB (ref 65–99)
POTASSIUM: 3.9 mmol/L (ref 3.5–5.1)
Potassium: 3.8 mmol/L (ref 3.5–5.1)
Sodium: 138 mmol/L (ref 135–145)
Sodium: 137 mmol/L (ref 135–145)
TOTAL PROTEIN: 4.9 g/dL — AB (ref 6.5–8.1)
TOTAL PROTEIN: 5.1 g/dL — AB (ref 6.5–8.1)

## 2017-10-24 LAB — MAGNESIUM: Magnesium: 2 mg/dL (ref 1.7–2.4)

## 2017-10-24 LAB — PHOSPHORUS: PHOSPHORUS: 2.9 mg/dL (ref 2.5–4.6)

## 2017-10-24 MED ORDER — SODIUM CHLORIDE 0.9 % IV SOLN
4.0000 g | Freq: Once | INTRAVENOUS | Status: AC
  Administered 2017-10-24: 4 g via INTRAVENOUS
  Filled 2017-10-24: qty 40

## 2017-10-24 NOTE — Progress Notes (Signed)
IP PROGRESS NOTE  Subjective:  Patient reports feeling well today. Continues to make efforts to read better than he has in past.Denies any new complaints.  Objective: Vital signs in last 24 hours: Blood pressure 119/81, pulse 90, temperature 97.7 F (36.5 C), temperature source Oral, resp. rate 16, height 6\' 2"  (1.88 m), weight 132 lb 15 oz (60.3 kg), SpO2 99 %.  Intake/Output from previous day: 11/20 0701 - 11/21 0700 In: 1510 [P.O.:480; I.V.:520; IV Piggyback:510] Out: 3100 [Urine:2650; Drains:450]  Physical Exam:  Patient is alert, awake, oriented x3.  No acute distress. HEENT: Muscle wasting, no oral mucosal lesions. Lungs: Clear to auscultation bilaterally Cardiac: S1/S2, regular, no murmurs Abdomen: Soft, nontender nondistended.  Bowel sounds are normoactive. Extremities: No lower extremity edema Portacath/PICC-without erythema.  Right nephrostomy in place draining clear yellow urine.   Lab Results: No results for input(s): WBC, HGB, HCT, PLT in the last 72 hours.  BMET Recent Labs    10/24/17 0349 10/24/17 1733  NA 138 137  K 3.8 3.9  CL 104 101  CO2 27 27  GLUCOSE 94 123*  BUN <5* <5*  CREATININE <0.30* <0.30*  CALCIUM 6.5* 7.3*    Lab Results  Component Value Date   CEA1 0.7 06/28/2017    Studies/Results: No results found.  Medications: I have reviewed the patient's current medications.  Assessment/Plan: 56 y.o. 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  Severe calorie/protein malnutrition and remarkable and persistent electrolyte abnormalities.  Overall, patient continues to require significant amount of medical care.  He has recurrent hospitalizations for the  previously described reasons.  Today, I have discussed the difficult time that we are having keeping him out of the hospital as well as the high risk of continuation of systemic therapy for his malignancy.  It is hard to withhold therapy from potentially curable cancer, and patient is insistent on continuing attempting to treat him with systemic chemotherapy.  **Lymphoma: We will reassess disease with CT of the chest/abdomen/pelvis and bone scan in lieu of obtaining PET/CT as patient remains hospitalized at this time.  If disease is responding to therapy, we do have better reason to continue attempting treatments.  If there is progressive disease, patient needs to be transitioned to supportive care at this time as he is unlikely to tolerate the second lines of chemotherapy for the lymphoma indication.  Restaging imaging with CT of the neck, chest, abdomen, pelvis and bone scan demonstrated positive response to previous therapy even with reduced intensity of the second cycle of chemo and delay in the administration.Marland Kitchen --I would like to proceed with additional chemotherapy treatments.  At this time, I would like to patient to continue supportive care to continue improving physically and nutritionally. I will plan, if he is discharged from the hospital this week, to see him back in my clinic next week and possibly conduct outpatient or inpatient third cycle of chemotherapy.  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 --Consider Neurosurgery consultation -- evaluate for possible need of cervical stabilization  **Electrolytes/Nutrition: Severe mixed calorie/protein malnutrition with a good portion of it having to do with complete lack of appetite and effort on the patient's behalf to improve his nutrition.  I have discussed alternative of enteral nutrition  via nasogastric or percutaneous gastric tube versus parenteral  nutrition through the vein.  In the context of recurrent infections, parenteral nutrition will have significant risk of causing severe infectious complications including fungal infections and I do not advocate for total parenteral nutrition at this time.  Patient is very apprehensive about tube feeds and promises to make himself eat more at this time. --Recommend continued follow-up by nutrition services with calorie counts --Continue electrolyte replacement therapy by IV or by mouth as needed based on his daily labs.  **Disposition: Discharged from the hospital discretion of the primary treating service.  Whether patient returns back to skilled nursing facility or home will depend on opinion of physical/occupational therapy and patient's wishes.    LOS: 8 days   Ardath Sax, MD   10/24/2017, 9:07 PM

## 2017-10-24 NOTE — Progress Notes (Signed)
   10/24/17 1456  Clinical Encounter Type  Visited With Patient;Health care provider  Visit Type Initial  Spiritual Encounters  Spiritual Needs Prayer   Rounding on the Palliative patients.  He was sitting up in bed and very welcoming.  Patient has a great outlook and said he is coming to terms with his diagnosis and sees this hospital stay as a set back and wants to get better.  Shared some about his family and over and over said how thankful he is even in the midst of illness.  Will follow as needed. Chaplain Katherene Ponto

## 2017-10-24 NOTE — Progress Notes (Signed)
PROGRESS NOTE  Cory Ibarra GUR:[id number] DOB: [birth year] DOA: 10/16/2017 PCP: [name], [name]  HPI/Recap of past 24 hours:  Denies pain, no fever, drain in place Last bm on Monday, he declined stool softener  Assessment/Plan: Active Problems:   Gross hematuria   Hypocalcemia   Severe protein-calorie malnutrition (HCC)   Hypokalemia  Hypocalcemia -Still low calcium this morning despite aggressive calcium replacement yesterday -Significantly poor p.o. intake -Vitamin D ordered for today -Continue p.o. supplementation -3 g of calcium gluconate ordered for today -Patient states he will attempt to increase his p.o. intake today  Hypokalemia -Aggressive IV replacement ordered for today  -Likely due to poor p.o. intake and malnutrition  Severe malnutrition -Note in chart states patient refused Ensure every time it was offered to him yesterday -Per calorie count patient only took in 20% to 25% of meals twice yesterday -Patient promises today he will attempt to increase his intake in hopes of starting to increase his appetite  Sinus tach -Review telemetry -Still intermittently tachycardic  History of B-cell lymphoma - He should followed byDr. [name] -Repeat scans from last week showing improvement in disease -Per Dr.Perlov's note if patient is still hospitalized over the holiday this week he may be transitioned to 3 W. for chemotherapy  Pulmonary emboli -Eliquis restarted on 10/19/2017   Objective uropathy with nephrostomy tube, hematuria -A continue to monitor for hematuria given that anticoagulation was started last week      DVT prophylaxis: SCDs Code Status: DNR Family Communication: No family bedside Disposition Plan: Pending improvement in electrolytes, then snf   Consultants:   Urology phone call by emergency department physician  Palliative care  Oncology  Procedures:   None  Antimicrobials:    None      Objective: BP 120/81 (BP Location: Right Arm)   Pulse (!) 105   Temp 98.1 F (36.7 C) (Oral)   Resp 20   Ht 6\' 2"  (1.88 m)   Wt 60.3 kg (132 lb 15 oz)   SpO2 96%   BMI 17.07 kg/m   Intake/Output Summary (Last 24 hours) at 10/24/2017 1931 Last data filed at 10/24/2017 1719 Gross per 24 hour  Intake 2217.5 ml  Output 3025 ml  Net -807.5 ml   Filed Weights   10/16/17 1845  Weight: 60.3 kg (132 lb 15 oz)    Exam: Patient is examined daily including today on 10/24/2017, exams remain the same as of yesterday except that has changed    General:  NAD, frail, cachectic  Cardiovascular: RRR  Respiratory: CTABL  Abdomen: Soft/ND/NT, positive BS. + drain  Musculoskeletal: No Edema  Neuro: alert, oriented   Data Reviewed: Basic Metabolic Panel: Recent Labs  Lab 10/21/17 0420 10/22/17 0317 10/22/17 1730 10/23/17 0758 10/23/17 1819 10/24/17 0349 10/24/17 1733  NA 137 137 137 136 135 138 137  K 2.8* 2.8* 4.7 3.3* 4.0 3.8 3.9  CL 103 105 106 104 101 104 101  CO2 24 23 24 24 26 27 27   GLUCOSE 87 86 119* 80 147* 94 123*  BUN <5* <5* <5* <5* <5* <5* <5*  CREATININE 0.37* <0.30* <0.30* <0.30* <0.30* <0.30* <0.30*  CALCIUM 5.5* 5.7* 6.3* 5.8* 6.6* 6.5* 7.3*  MG 1.7 1.6* 2.2 1.8  --  2.0  --   PHOS  --   --  1.9* 1.4*  --  2.9  --    Liver Function Tests: Recent Labs  Lab 10/22/17 0317 10/22/17 1730 10/23/17 1819 10/24/17 0349 10/24/17 1733  AST  17 19 18 18 19   ALT 15* 14* 14* 14* 15*  ALKPHOS 80 90 92 88 91  BILITOT 1.1 0.9 0.6 1.0 0.8  PROT 4.7* 4.8* 5.1* 4.9* 5.1*  ALBUMIN 2.4* 2.4* 2.5* 2.4* 2.5*   No results for input(s): LIPASE, AMYLASE in the last 168 hours. No results for input(s): AMMONIA in the last 168 hours. CBC: Recent Labs  Lab 10/18/17 1805 10/19/17 0344  WBC 8.5 4.9  NEUTROABS 8.0*  --   HGB 9.1* 8.9*  HCT 28.4* 27.8*  MCV 82.1 82.5  PLT 270 243   Cardiac Enzymes:   No results for input(s): CKTOTAL, CKMB,  CKMBINDEX, TROPONINI in the last 168 hours. BNP (last 3 results) Recent Labs    08/21/17 2249  BNP 607.1*    ProBNP (last 3 results) No results for input(s): PROBNP in the last 8760 hours.  CBG: No results for input(s): GLUCAP in the last 168 hours.  Recent Results (from the past 240 hour(s))  C difficile quick scan w PCR reflex     Status: None   Collection Time: 10/17/17  6:05 PM  Result Value Ref Range Status   C Diff antigen NEGATIVE NEGATIVE Final   C Diff toxin NEGATIVE NEGATIVE Final   C Diff interpretation No C. difficile detected.  Final     Studies: No results found.  Scheduled Meds: . apixaban  5 mg Oral BID  . calcium carbonate  1,000 mg Oral TID  . dronabinol  5 mg Oral QAC lunch  . feeding supplement (ENSURE ENLIVE)  237 mL Oral TID BM  . magnesium oxide  400 mg Oral BID  . mirtazapine  15 mg Oral QHS  . multivitamin  15 mL Oral Daily  . OLANZapine zydis  2.5 mg Oral QHS  . oxyCODONE  10 mg Oral Q12H  . pantoprazole  40 mg Oral Q lunch  . potassium & sodium phosphates  1 packet Oral TID AC & HS  . predniSONE  20 mg Oral Q breakfast  . sulfamethoxazole-trimethoprim  2 tablet Oral Once per day on Mon Wed Fri  . Vitamin D (Ergocalciferol)  50,000 Units Oral Q Mon    Continuous Infusions: . sodium chloride 75 mL/hr at 10/24/17 0757     Time spent: 50mins, case discussed with palliative care I have personally reviewed and interpreted on  10/24/2017 daily labs, tele strips, imagings as discussed above under date review session and assessment and plans.  I reviewed all nursing notes, pharmacy notes, consultant notes,  vitals, pertinent old records  I have discussed plan of care as described above with RN , patient  on 10/24/2017   Florencia Reasons MD, PhD  [facility] Pager [phone]. If 7PM-7AM, please contact night-coverage at [email or web address], password [facility] 10/24/2017, 7:31 PM  LOS: 8 days

## 2017-10-24 NOTE — Progress Notes (Signed)
LCSW following for return to facility.  Per POA, patient will return to [facility] once medically ready.  Facility is aware.   Carolin Coy [place]

## 2017-10-25 DIAGNOSIS — C8330 Diffuse large B-cell lymphoma, unspecified site: Secondary | ICD-10-CM

## 2017-10-25 DIAGNOSIS — Z515 Encounter for palliative care: Secondary | ICD-10-CM

## 2017-10-25 LAB — CBC WITH DIFFERENTIAL/PLATELET
BASOS PCT: 0 %
Basophils Absolute: 0 10*3/uL (ref 0.0–0.1)
EOS ABS: 0 10*3/uL (ref 0.0–0.7)
EOS PCT: 1 %
HCT: 31.6 % — ABNORMAL LOW (ref 39.0–52.0)
HEMOGLOBIN: 9.9 g/dL — AB (ref 13.0–17.0)
LYMPHS ABS: 0.6 10*3/uL — AB (ref 0.7–4.0)
Lymphocytes Relative: 14 %
MCH: 26.3 pg (ref 26.0–34.0)
MCHC: 31.3 g/dL (ref 30.0–36.0)
MCV: 83.8 fL (ref 78.0–100.0)
MONOS PCT: 9 %
Monocytes Absolute: 0.4 10*3/uL (ref 0.1–1.0)
NEUTROS PCT: 76 %
Neutro Abs: 3.3 10*3/uL (ref 1.7–7.7)
PLATELETS: 197 10*3/uL (ref 150–400)
RBC: 3.77 MIL/uL — AB (ref 4.22–5.81)
RDW: 20.4 % — ABNORMAL HIGH (ref 11.5–15.5)
WBC: 4.4 10*3/uL (ref 4.0–10.5)

## 2017-10-25 LAB — COMPREHENSIVE METABOLIC PANEL
ALK PHOS: 88 U/L (ref 38–126)
ALT: 12 U/L — AB (ref 17–63)
AST: 18 U/L (ref 15–41)
Albumin: 2.4 g/dL — ABNORMAL LOW (ref 3.5–5.0)
Anion gap: 7 (ref 5–15)
BILIRUBIN TOTAL: 0.9 mg/dL (ref 0.3–1.2)
CALCIUM: 6.8 mg/dL — AB (ref 8.9–10.3)
CHLORIDE: 105 mmol/L (ref 101–111)
CO2: 27 mmol/L (ref 22–32)
Glucose, Bld: 84 mg/dL (ref 65–99)
Potassium: 3.3 mmol/L — ABNORMAL LOW (ref 3.5–5.1)
Sodium: 139 mmol/L (ref 135–145)
Total Protein: 4.8 g/dL — ABNORMAL LOW (ref 6.5–8.1)

## 2017-10-25 LAB — MAGNESIUM: MAGNESIUM: 1.7 mg/dL (ref 1.7–2.4)

## 2017-10-25 MED ORDER — MAGNESIUM SULFATE 2 GM/50ML IV SOLN
2.0000 g | Freq: Once | INTRAVENOUS | Status: AC
  Administered 2017-10-25: 2 g via INTRAVENOUS
  Filled 2017-10-25: qty 50

## 2017-10-25 MED ORDER — SODIUM CHLORIDE 0.9 % IV SOLN
2.0000 g | Freq: Once | INTRAVENOUS | Status: AC
  Administered 2017-10-25: 2 g via INTRAVENOUS
  Filled 2017-10-25: qty 20

## 2017-10-25 MED ORDER — OLANZAPINE 5 MG PO TBDP
5.0000 mg | ORAL_TABLET | Freq: Every day | ORAL | Status: DC
  Administered 2017-10-25 – 2017-11-13 (×20): 5 mg via ORAL
  Filled 2017-10-25 (×21): qty 1

## 2017-10-25 MED ORDER — POTASSIUM CHLORIDE CRYS ER 20 MEQ PO TBCR
40.0000 meq | EXTENDED_RELEASE_TABLET | Freq: Once | ORAL | Status: AC
  Administered 2017-10-25: 40 meq via ORAL
  Filled 2017-10-25: qty 2

## 2017-10-25 NOTE — Progress Notes (Signed)
Daily Progress Note   Patient Name: Demarkus Remmel       Date: 10/25/2017 DOB: 1961-07-30  Age: 56 y.o. MRN#: [id number] Attending Physician: Florencia Reasons, MD Primary Care Physician: [name], [name] Admit Date: 10/16/2017  Reason for Consultation/Follow-up: Non pain symptom management  Subjective: I met with Mr. Sperbeck today to discuss his continued anorexia.    He reports that he is "just not hungry or thirsty."  We reviewed what he meant by this in depth. He reports no pain, nausea, poor taste, early satiety, or dysphagia.  He reports continuing to have regular BMs including one "yesterday, I think."      Length of Stay: 9  Current Medications: Scheduled Meds:  . apixaban  5 mg Oral BID  . calcium carbonate  1,000 mg Oral TID  . feeding supplement (ENSURE ENLIVE)  237 mL Oral TID BM  . magnesium oxide  400 mg Oral BID  . multivitamin  15 mL Oral Daily  . OLANZapine zydis  5 mg Oral QHS  . oxyCODONE  10 mg Oral Q12H  . pantoprazole  40 mg Oral Q lunch  . potassium & sodium phosphates  1 packet Oral TID AC & HS  . predniSONE  20 mg Oral Q breakfast  . sulfamethoxazole-trimethoprim  2 tablet Oral Once per day on Mon Wed Fri  . Vitamin D (Ergocalciferol)  50,000 Units Oral Q Mon    Continuous Infusions: . sodium chloride 75 mL/hr at 10/24/17 2107    PRN Meds: acetaminophen **OR** acetaminophen, iopamidol, labetalol, LORazepam, oxyCODONE, sodium chloride flush  Physical Exam     General: Alert, awake, in no acute distress. Lying in bed.  Pleasant and cooperative but somewhat flat affect. HEENT: No bruits, no goiter, no JVD.  No signs of thrush in oral cavity. Heart: Regular rate and rhythm. No murmur appreciated. Lungs: Good air movement, clear Abdomen: Soft, nontender,  nondistended, positive bowel sounds.  Ext: No significant edema Skin: Warm and dry Neuro: Grossly intact, nonfocal.       Vital Signs: BP 100/70 (BP Location: Right Arm)   Pulse 94   Temp 97.7 F (36.5 C) (Oral)   Resp 18   Ht 6' 2"  (1.88 m)   Wt 60.3 kg (132 lb 15 oz)   SpO2 98%   BMI 17.07 kg/m  SpO2: SpO2: 98 % O2 Device: O2 Device: Not Delivered O2 Flow Rate:    Intake/output summary:   Intake/Output Summary (Last 24 hours) at 10/25/2017 1103 Last data filed at 10/25/2017 1001 Gross per 24 hour  Intake 2327.5 ml  Output 2925 ml  Net -597.5 ml   LBM: Last BM Date: 10/22/17 Baseline Weight: Weight: 60.3 kg (132 lb 15 oz) Most recent weight: Weight: 60.3 kg (132 lb 15 oz)       Palliative Assessment/Data:      Patient Active Problem List   Diagnosis Date Noted  . Pressure injury of skin 10/04/2017  . Dehydration   . Hypoxia   . Pulmonary emboli ([place]) 10/03/2017  . Healthcare-associated pneumonia   . Obstructive uropathy   . Liver function test abnormality   . Renal calculus or stone 09/19/2017  . Leakage of nephrostomy catheter ([place]) 09/19/2017  . Nephrostomy status ([place]) 09/19/2017  . Gastroesophageal reflux disease without esophagitis 09/19/2017  . Hypokalemia 09/18/2017  . Hypomagnesemia 09/18/2017  . Electrolyte abnormality   . Hypovolemia 09/17/2017  . Chemotherapy-induced nausea 09/17/2017  . Constipation 09/17/2017  . DLBCL (diffuse large B cell lymphoma) ([place]) 09/17/2017  . Hypocalcemia 09/17/2017  . Severe protein-calorie malnutrition ([place])   . Adjustment disorder with mixed anxiety and depressed mood   . Palliative care encounter   . Admission for antineoplastic chemotherapy   . Septic shock due to Escherichia coli ([place])   . Neutropenia ([place]) 08/21/2017  . Gross hematuria   . Lactic acidosis   . Neutropenic sepsis ([place])   . Hypophosphatemia 08/20/2017  . Diffuse large B cell lymphoma ([place]) 07/03/2017  . Bony metastasis ([place])  06/29/2017  . Chest wall mass   . 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 Closed compression fracture of L3 lumbar vertebra ([place]) 06/28/2017  . Severe back pain 06/28/2017  . 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    Patient is admitted to hospital medicine service with obstructive uropathy with nephrostomy tube, hematuria, also with ongoing electrolyte imbalances such as hypokalemia and hypocalcemia.  Recommendations/Plan:  Anorexia/cachexia: Cancer related.  Reviewed other possible causes including candidiasis, constipation, and gastric dysmotility with no signs pointing to these on history or examination.  Discussed with him that he is on low doses of multiple medications (Zyprexa, Remeron, prednisone, dronabinol) and after talking with him, I would recommend choosing less agents at more therapeutic doses as I think this may be more helpful and will reduce some "pill fatigue" from having so many medications.  Will continue prednisone, increase zyprexa to 104m QHS, d/c remeron and dronabinol.  I told him I will check in with him in the next day or two to see if any improvement, and he will call in the interim if needed.  Goals of Care and Additional Recommendations:  Limitations on Scope of Treatment: Full Scope Treatment  Code Status:    Code Status Orders  (From admission, onward)        Start     Ordered   10/16/17 1557  Do not attempt resuscitation (DNR)  Continuous    Question Answer Comment  In the event of cardiac or respiratory ARREST Do not call a "code blue"  In the event of cardiac or  respiratory ARREST Do not perform Intubation, CPR, defibrillation or ACLS   In the event of cardiac or respiratory ARREST Use medication by any route, position, wound care, and other measures to relive pain and suffering. May use oxygen, suction and manual treatment of airway obstruction as needed for comfort.      10/16/17 1557    Code Status History    Date Active Date Inactive Code Status Order ID Comments User Context   10/03/2017 17:34 10/08/2017 16:55 Partial Code [id number]  Florencia Reasons, MD Inpatient   09/17/2017 17:57 09/25/2017 21:21 Partial Code [id number]  Ardath Sax, MD Inpatient   08/21/2017 20:19 09/03/2017 16:46 Partial Code [id number]  Omar Person, NP Inpatient   08/21/2017 17:18 08/21/2017 20:19 Full Code [id number]  Doreatha Lew, MD Inpatient   06/28/2017 14:21 07/03/2017 19:43 Full Code [id number]  Phillips Grout, MD ED    Advance Directive Documentation     Most Recent Value  Type of Advance Directive  Healthcare Power of Attorney  Pre-existing out of facility DNR order (yellow form or pink MOST form)  No data  "MOST" Form in Place?  No data       Prognosis:   Unable to determine  Discharge Planning:  To Be Determined  Care plan was discussed with patient, Dr. Erlinda Hong  Thank you for allowing the Palliative Medicine Team to assist in the care of this patient.   Total Time 30 Prolonged Time Billed No      Greater than 50%  of this time was spent counseling and coordinating care related to the above assessment and plan.  Micheline Rough, MD  Please contact Palliative Medicine Team phone at [phone] for questions and concerns.

## 2017-10-25 NOTE — Progress Notes (Signed)
PROGRESS NOTE  Cory Ibarra LNL:[id number] DOB: 12-14-1960 DOA: 10/16/2017 PCP: [name], [name]  HPI/Recap of past 24 hours:  Seems stronger, Denies pain, no fever, drain in place Last bm on Monday, he declined stool softener  Assessment/Plan: Active Problems:   Gross hematuria   Hypocalcemia   Severe protein-calorie malnutrition (HCC)   Hypokalemia  Hypocalcemia --Significantly poor p.o. intake -Vitamin D ordered -Continue p.o. supplementation -Still low calcium this morning despite aggressive calcium replacement yesterday  Hypokalemia -Aggressive IV replacement ordered for today  -Likely due to poor p.o. intake and malnutrition  Severe malnutrition -Note in chart states patient refused Ensure every time it was offered to him yesterday -Per calorie count patient only took in 20% to 25% of meals twice yesterday -Patient promises today he will attempt to increase his intake in hopes of starting to increase his appetite  Sinus tach, tsh 2.2 -Review telemetry -Still intermittently tachycardic  History of B-cell lymphoma - He should followed byDr. [name] -Repeat scans from last week showing improvement in disease -Per Dr.Perlov's note if patient is still hospitalized over the holiday this week he may be transitioned to 3 W. for chemotherapy   Objective uropathy with nephrostomy tube, hematuria, indwelling foley -A continue to monitor for hematuria given that anticoagulation was started last week   Bone lesion "C1, C2, C3, C4 and T1 vertebrae. Associated pathologic fractures of the right C1 lateral mass and the C4 vertebral body." Defer to oncology  Pulmonary emboli -Eliquis restarted on 10/19/2017     DVT prophylaxis: SCDs/eliquis Code Status: DNR Family Communication: No family bedside Disposition Plan: Pending improvement in electrolytes, may need inpatient chemo, then snf   Consultants:   Urology phone call by emergency department  physician  Palliative care  Oncology  Procedures:   None  Antimicrobials:   None      Objective: BP 100/70 (BP Location: Right Arm)   Pulse 94   Temp 97.7 F (36.5 C) (Oral)   Resp 18   Ht 6\' 2"  (1.88 m)   Wt 60.3 kg (132 lb 15 oz)   SpO2 98%   BMI 17.07 kg/m   Intake/Output Summary (Last 24 hours) at 10/25/2017 4081 Last data filed at 10/25/2017 4481 Gross per 24 hour  Intake 2227.5 ml  Output 2925 ml  Net -697.5 ml   Filed Weights   10/16/17 1845  Weight: 60.3 kg (132 lb 15 oz)    Exam: Patient is examined daily including today on 10/25/2017, exams remain the same as of yesterday except that has changed    General:  NAD, frail, cachectic, + chest port, right nephrostomy tube with clear urine, indwelling foley  Cardiovascular: RRR  Respiratory: CTABL  Abdomen: Soft/ND/NT, positive BS. + drain  Musculoskeletal: left ankle pitting edema, barely able to lift legs against gravity  Neuro: alert, oriented   Data Reviewed: Basic Metabolic Panel: Recent Labs  Lab 10/22/17 0317 10/22/17 1730 10/23/17 0758 10/23/17 1819 10/24/17 0349 10/24/17 1733 10/25/17 0427  NA 137 137 136 135 138 137 139  K 2.8* 4.7 3.3* 4.0 3.8 3.9 3.3*  CL 105 106 104 101 104 101 105  CO2 23 24 24 26 27 27 27   GLUCOSE 86 119* 80 147* 94 123* 84  BUN <5* <5* <5* <5* <5* <5* <5*  CREATININE <0.30* <0.30* <0.30* <0.30* <0.30* <0.30* <0.30*  CALCIUM 5.7* 6.3* 5.8* 6.6* 6.5* 7.3* 6.8*  MG 1.6* 2.2 1.8  --  2.0  --  1.7  PHOS  --  1.9* 1.4*  --  2.9  --   --    Liver Function Tests: Recent Labs  Lab 10/22/17 1730 10/23/17 1819 10/24/17 0349 10/24/17 1733 10/25/17 0427  AST 19 18 18 19 18   ALT 14* 14* 14* 15* 12*  ALKPHOS 90 92 88 91 88  BILITOT 0.9 0.6 1.0 0.8 0.9  PROT 4.8* 5.1* 4.9* 5.1* 4.8*  ALBUMIN 2.4* 2.5* 2.4* 2.5* 2.4*   No results for input(s): LIPASE, AMYLASE in the last 168 hours. No results for input(s): AMMONIA in the last 168 hours. CBC: Recent  Labs  Lab 10/18/17 1805 10/19/17 0344 10/25/17 0427  WBC 8.5 4.9 4.4  NEUTROABS 8.0*  --  3.3  HGB 9.1* 8.9* 9.9*  HCT 28.4* 27.8* 31.6*  MCV 82.1 82.5 83.8  PLT 270 243 197   Cardiac Enzymes:   No results for input(s): CKTOTAL, CKMB, CKMBINDEX, TROPONINI in the last 168 hours. BNP (last 3 results) Recent Labs    08/21/17 2249  BNP 607.1*    ProBNP (last 3 results) No results for input(s): PROBNP in the last 8760 hours.  CBG: No results for input(s): GLUCAP in the last 168 hours.  Recent Results (from the past 240 hour(s))  C difficile quick scan w PCR reflex     Status: None   Collection Time: 10/17/17  6:05 PM  Result Value Ref Range Status   C Diff antigen NEGATIVE NEGATIVE Final   C Diff toxin NEGATIVE NEGATIVE Final   C Diff interpretation No C. difficile detected.  Final     Studies: No results found.  Scheduled Meds: . apixaban  5 mg Oral BID  . calcium carbonate  1,000 mg Oral TID  . dronabinol  5 mg Oral QAC lunch  . feeding supplement (ENSURE ENLIVE)  237 mL Oral TID BM  . magnesium oxide  400 mg Oral BID  . mirtazapine  15 mg Oral QHS  . multivitamin  15 mL Oral Daily  . OLANZapine zydis  2.5 mg Oral QHS  . oxyCODONE  10 mg Oral Q12H  . pantoprazole  40 mg Oral Q lunch  . potassium & sodium phosphates  1 packet Oral TID AC & HS  . potassium chloride  40 mEq Oral Once  . predniSONE  20 mg Oral Q breakfast  . sulfamethoxazole-trimethoprim  2 tablet Oral Once per day on Mon Wed Fri  . Vitamin D (Ergocalciferol)  50,000 Units Oral Q Mon    Continuous Infusions: . sodium chloride 75 mL/hr at 10/24/17 2107  . calcium gluconate    . magnesium sulfate 1 - 4 g bolus IVPB       Time spent: 5mins, case discussed with palliative care I have personally reviewed and interpreted on  10/25/2017 daily labs, tele strips, imagings as discussed above under date review session and assessment and plans.  I reviewed all nursing notes, pharmacy notes, consultant  notes,  vitals, pertinent old records  I have discussed plan of care as described above with RN , patient  on 10/25/2017   Florencia Reasons MD, PhD  [facility] Pager [phone]. If 7PM-7AM, please contact night-coverage at [email or web address], password [facility] 10/25/2017, 8:32 AM  LOS: 9 days

## 2017-10-26 ENCOUNTER — Inpatient Hospital Stay (HOSPITAL_COMMUNITY): Payer: Medicaid Other

## 2017-10-26 DIAGNOSIS — Z936 Other artificial openings of urinary tract status: Secondary | ICD-10-CM

## 2017-10-26 DIAGNOSIS — Z960 Presence of urogenital implants: Secondary | ICD-10-CM

## 2017-10-26 DIAGNOSIS — M899 Disorder of bone, unspecified: Secondary | ICD-10-CM

## 2017-10-26 DIAGNOSIS — R5381 Other malaise: Secondary | ICD-10-CM

## 2017-10-26 LAB — MAGNESIUM: Magnesium: 2 mg/dL (ref 1.7–2.4)

## 2017-10-26 LAB — BASIC METABOLIC PANEL
Anion gap: 5 (ref 5–15)
CHLORIDE: 107 mmol/L (ref 101–111)
CO2: 26 mmol/L (ref 22–32)
Calcium: 6.6 mg/dL — ABNORMAL LOW (ref 8.9–10.3)
Glucose, Bld: 85 mg/dL (ref 65–99)
Potassium: 3.3 mmol/L — ABNORMAL LOW (ref 3.5–5.1)
Sodium: 138 mmol/L (ref 135–145)

## 2017-10-26 LAB — PHOSPHORUS: Phosphorus: 2.7 mg/dL (ref 2.5–4.6)

## 2017-10-26 MED ORDER — SODIUM CHLORIDE 0.9 % IV SOLN
2.0000 g | Freq: Once | INTRAVENOUS | Status: AC
  Administered 2017-10-26: 2 g via INTRAVENOUS
  Filled 2017-10-26: qty 20

## 2017-10-26 MED ORDER — POTASSIUM CHLORIDE 10 MEQ/100ML IV SOLN
10.0000 meq | INTRAVENOUS | Status: AC
  Administered 2017-10-26 (×6): 10 meq via INTRAVENOUS
  Filled 2017-10-26 (×6): qty 100

## 2017-10-26 NOTE — Progress Notes (Signed)
IP PROGRESS NOTE  Subjective:  Patient reports feeling well today. Patient insists that his intake has improved, but actual records demonstrate 20 to 25% caloric intake from recommended.  Objective: Vital signs in last 24 hours: Blood pressure 114/64, pulse 88, temperature 97.9 F (36.6 C), temperature source Oral, resp. rate 16, height 6\' 2"  (1.88 m), weight 132 lb 15 oz (60.3 kg), SpO2 99 %.  Intake/Output from previous day: 11/22 0701 - 11/23 0700 In: 210 [P.O.:200; I.V.:10] Out: 2500 [Urine:2500]  Physical Exam:  Patient is alert, awake, oriented x3.  No acute distress. HEENT: Muscle wasting, no oral mucosal lesions. Lungs: Clear to auscultation bilaterally Cardiac: S1/S2, regular, no murmurs Abdomen: Soft, nontender nondistended.  Bowel sounds are normoactive. Extremities: No lower extremity edema Portacath/PICC-without erythema.  Right nephrostomy in place draining clear yellow urine.   Lab Results: Recent Labs    10/25/17 0427  WBC 4.4  HGB 9.9*  HCT 31.6*  PLT 197    BMET Recent Labs    10/25/17 0427 10/26/17 0458  NA 139 138  K 3.3* 3.3*  CL 105 107  CO2 27 26  GLUCOSE 84 85  BUN <5* <5*  CREATININE <0.30* <0.30*  CALCIUM 6.8* 6.6*    Lab Results  Component Value Date   CEA1 0.7 06/28/2017    Studies/Results: No results found.  Medications: I have reviewed the patient's current medications.  Assessment/Plan: 56 y.o. 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  Severe calorie/protein malnutrition and remarkable and persistent electrolyte abnormalities.  Overall, patient continues to require significant amount of medical care.  He has recurrent hospitalizations  for the previously described reasons.  Today, I have discussed the difficult time that we are having keeping him out of the hospital as well as the high risk of continuation of systemic therapy for his malignancy.  It is hard to withhold therapy from potentially curable cancer, and patient is insistent on continuing attempting to treat him with systemic chemotherapy.  **Lymphoma: We will reassess disease with CT of the chest/abdomen/pelvis and bone scan in lieu of obtaining PET/CT as patient remains hospitalized at this time.  If disease is responding to therapy, we do have better reason to continue attempting treatments.  If there is progressive disease, patient needs to be transitioned to supportive care at this time as he is unlikely to tolerate the second lines of chemotherapy for the lymphoma indication.  Restaging imaging with CT of the neck, chest, abdomen, pelvis and bone scan demonstrated positive response to previous therapy even with reduced intensity of the second cycle of chemo and delay in the administration.. --Current severe combined calorie/protein malnutrition precludes renegotiation of systemic chemotherapy without adequate nutrition support. At this time, it is unlikely that patient will resume adequate intake on his own. My recommendation is to proceed with placement of nasogastric or percutaneous gastric tube to provide enteral continuous nutrition or intermittent gastric nutritional supplementation. Patient is understandably apprehensive, but without at least a weeklong supply, I am unable to proceed with any further chemotherapy due to higher likelihood of further deterioration of performance status and potential increase chances of dying.  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 --Consider Neurosurgery consultation -- evaluate for possible need of cervical  stabilization  **  Electrolytes/Nutrition: Severe mixed calorie/protein malnutrition with a good portion of it having to do with complete lack of appetite and effort on the patient's behalf to improve his nutrition.  I have discussed alternative of enteral nutrition via nasogastric or percutaneous gastric tube versus parenteral nutrition through the vein.  In the context of recurrent infections, parenteral nutrition will have significant risk of causing severe infectious complications including fungal infections and I do not advocate for total parenteral nutrition at this time.  Patient is very apprehensive about tube feeds and promises to make himself eat more at this time. --Recommend continued follow-up by nutrition services with calorie counts --Continue electrolyte replacement therapy by IV or by mouth as needed based on his daily labs.  **Disposition: Discharged from the hospital discretion of the primary treating service.  Whether patient returns back to skilled nursing facility or home will depend on opinion of physical/occupational therapy and patient's wishes.    LOS: 10 days   Ardath Sax, MD   10/26/2017, 10:24 PM

## 2017-10-26 NOTE — Progress Notes (Signed)
Daily Progress Note   Patient Name: Cory Ibarra       Date: 10/26/2017 DOB: 31-Mar-1961  Age: 56 y.o. MRN#: [id number] Attending Physician: Florencia Reasons, MD Primary Care Physician: [name], [name] Admit Date: 10/16/2017  Reason for Consultation/Follow-up: Non pain symptom management  Subjective: I met with Cory Ibarra today to discuss his continued anorexia.    Reports that he had a little more to eat last evening and again at breakfast this AM.  Reports that he is still not feeling hungry, but he is able to tolerate more than he has for the last several days.   No other concerns today.  Length of Stay: 10  Current Medications: Scheduled Meds:  . apixaban  5 mg Oral BID  . calcium carbonate  1,000 mg Oral TID  . feeding supplement (ENSURE ENLIVE)  237 mL Oral TID BM  . magnesium oxide  400 mg Oral BID  . multivitamin  15 mL Oral Daily  . OLANZapine zydis  5 mg Oral QHS  . oxyCODONE  10 mg Oral Q12H  . pantoprazole  40 mg Oral Q lunch  . potassium & sodium phosphates  1 packet Oral TID AC & HS  . predniSONE  20 mg Oral Q breakfast  . sulfamethoxazole-trimethoprim  2 tablet Oral Once per day on Mon Wed Fri  . Vitamin D (Ergocalciferol)  50,000 Units Oral Q Mon    Continuous Infusions: . sodium chloride 75 mL/hr at 10/25/17 1254  . calcium gluconate 2 g (10/26/17 0946)  . potassium chloride 10 mEq (10/26/17 0948)    PRN Meds: acetaminophen **OR** acetaminophen, iopamidol, labetalol, LORazepam, oxyCODONE, sodium chloride flush  Physical Exam     General: Alert, awake, in no acute distress. Lying in bed.  Pleasant and cooperative but somewhat flat affect. HEENT: No bruits, no goiter, no JVD.  No signs of thrush in oral cavity. Heart: Regular rate and rhythm. No murmur  appreciated. Lungs: Good air movement, clear Abdomen: Soft, nontender, nondistended, positive bowel sounds.  Ext: No significant edema Skin: Warm and dry Neuro: Grossly intact, nonfocal.       Vital Signs: BP 101/72 (BP Location: Right Arm)   Pulse (!) 115   Temp 97.9 F (36.6 C) (Oral)   Resp 16   Ht 6' 2"  (1.88 m)  Wt 60.3 kg (132 lb 15 oz)   SpO2 100%   BMI 17.07 kg/m  SpO2: SpO2: 100 % O2 Device: O2 Device: Not Delivered O2 Flow Rate:    Intake/output summary:   Intake/Output Summary (Last 24 hours) at 10/26/2017 1004 Last data filed at 10/26/2017 0800 Gross per 24 hour  Intake 610 ml  Output 2500 ml  Net -1890 ml   LBM: Last BM Date: 10/22/17 Baseline Weight: Weight: 60.3 kg (132 lb 15 oz) Most recent weight: Weight: 60.3 kg (132 lb 15 oz)       Palliative Assessment/Data:      Patient Active Problem List   Diagnosis Date Noted  . Pressure injury of skin 10/04/2017  . Dehydration   . Hypoxia   . Pulmonary emboli ([place]) 10/03/2017  . Healthcare-associated pneumonia   . Obstructive uropathy   . Liver function test abnormality   . Renal calculus or stone 09/19/2017  . Leakage of nephrostomy catheter ([place]) 09/19/2017  . Nephrostomy status ([place]) 09/19/2017  . Gastroesophageal reflux disease without esophagitis 09/19/2017  . Hypokalemia 09/18/2017  . Hypomagnesemia 09/18/2017  . Electrolyte abnormality   . Hypovolemia 09/17/2017  . Chemotherapy-induced nausea 09/17/2017  . Constipation 09/17/2017  . 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 Hypocalcemia 09/17/2017  . Severe protein-calorie malnutrition ([place])   . Adjustment disorder with mixed anxiety and depressed mood   . Palliative care encounter   . Admission for antineoplastic chemotherapy   . Septic shock due to Escherichia coli ([employer])   . Neutropenia ([place]) 08/21/2017  . Gross hematuria   . Lactic acidosis   . Neutropenic sepsis ([place])   . Hypophosphatemia 08/20/2017  .  Diffuse large B cell lymphoma ([place]) 07/03/2017  . Bony metastasis ([place]) 06/29/2017  . Chest wall mass   . 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 Closed compression fracture of L3 lumbar vertebra ([place]) 06/28/2017  . Severe back pain 06/28/2017  . 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    Patient is admitted to hospital medicine service with obstructive uropathy with nephrostomy tube, hematuria, also with ongoing electrolyte imbalances such as hypokalemia and hypocalcemia.  Recommendations/Plan:  Anorexia/cachexia: Cancer related. Reports "a little better" today.  Will continue prednisone, zyprexa 44m QHS.  Discussed strategies such as sipping ensure throughout day in order to get in extra calories.  I told him I will check in with him in the next day or two to see if any improvement, and he will call in the interim if needed.     Goals of Care and Additional Recommendations:  Limitations on Scope of Treatment: Full Scope Treatment  Code Status:    Code Status Orders  (From admission, onward)        Start     Ordered   10/16/17 1557  Do not attempt resuscitation (DNR)  Continuous    Question Answer Comment  In the event of cardiac or respiratory ARREST Do not call a "code blue"   In the event of cardiac or respiratory ARREST Do not perform Intubation, CPR, defibrillation or ACLS   In the event of cardiac or respiratory ARREST Use medication by any route, position, wound care, and other measures  to relive pain and suffering. May use oxygen, suction and manual treatment of airway obstruction as needed for  comfort.      10/16/17 1557    Code Status History    Date Active Date Inactive Code Status Order ID Comments User Context   10/03/2017 17:34 10/08/2017 16:55 Partial Code [id number]  Florencia Reasons, MD Inpatient   09/17/2017 17:57 09/25/2017 21:21 Partial Code [id number]  Ardath Sax, MD Inpatient   08/21/2017 20:19 09/03/2017 16:46 Partial Code [id number]  Omar Person, NP Inpatient   08/21/2017 17:18 08/21/2017 20:19 Full Code [id number]  Doreatha Lew, MD Inpatient   06/28/2017 14:21 07/03/2017 19:43 Full Code [id number]  Phillips Grout, MD ED    Advance Directive Documentation     Most Recent Value  Type of Advance Directive  Healthcare Power of Attorney  Pre-existing out of facility DNR order (yellow form or pink MOST form)  No data  "MOST" Form in Place?  No data       Prognosis:   Unable to determine  Discharge Planning:  To Be Determined  Care plan was discussed with patient, bedside RN  Thank you for allowing the Palliative Medicine Team to assist in the care of this patient.   Total Time 20 Prolonged Time Billed No      Greater than 50%  of this time was spent counseling and coordinating care related to the above assessment and plan.  Micheline Rough, MD  Please contact Palliative Medicine Team phone at [phone] for questions and concerns.

## 2017-10-26 NOTE — Consult Note (Signed)
Reason for Consult: Back pain Referring Physician: [facility]  Cory Ibarra is an 56 y.o. male.  HPI: 57 year old gentleman with a long, [place] medical history. 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 In September he developed sepsis and was admitted and that's when he developed his global weakness. He has been basically non-amatory without assistance since September. He's been in the hospital except for a few weeks here and there was, back to nursing facility is never got the point where he is strong enough to go home. Significantly nutritionally and metabolically deconditioned and weakened. His only complaint is back pain he does not have any neck pain he does say his legs feel little bit numb and they've been a little bit weak. They said they've been that way for several weeks.  Past Medical History:  Diagnosis Date  . Cancer ([place])   . Tendonitis     Past Surgical History:  Procedure Laterality Date  . CYSTOSCOPY W/ URETERAL STENT PLACEMENT Right 08/21/2017   Procedure: CYSTOSCOPY WITH RETROGRADE PYELOGRAM, RIGHT;  Surgeon: Alexis Frock, MD;  Location: [facility];  Service: Urology;  Laterality: Right;  . HERNIA REPAIR  1988   Dr. Vida Rigger  . IR FLUORO GUIDE PORT INSERTION RIGHT  07/26/2017  . IR NEPHROSTOMY EXCHANGE RIGHT  10/04/2017  . IR NEPHROSTOMY PLACEMENT RIGHT  08/21/2017  . IR US GUIDE VASC ACCESS RIGHT  07/26/2017    Family History  Problem Relation Age of Onset  . Cancer Father     Social History:  reports that  has never smoked. he has never used smokeless tobacco. He reports that he drinks alcohol. He reports that he does not use drugs.  Allergies:  Allergies  Allergen Reactions  . Bee Venom Swelling    Medications: I have reviewed the  patient's current medications.  Results for orders placed or performed during the hospital encounter of 10/16/17 (from the past 48 hour(s))  Comprehensive metabolic panel     Status: Abnormal   Collection Time: 10/25/17  4:27 AM  Result Value Ref Range   Sodium 139 135 - 145 mmol/L   Potassium 3.3 (L) 3.5 - 5.1 mmol/L   Chloride 105 101 - 111 mmol/L   CO2 27 22 - 32 mmol/L   Glucose, Bld 84 65 - 99 mg/dL   BUN <5 (L) 6 - 20 mg/dL   Creatinine, Ser <0.30 (L) 0.61 - 1.24 mg/dL   Calcium 6.8 (L) 8.9 - 10.3 mg/dL   Total Protein 4.8 (L) 6.5 - 8.1 g/dL   Albumin 2.4 (L) 3.5 - 5.0 g/dL   AST 18 15 - 41 U/L   ALT 12 (L) 17 - 63 U/L   Alkaline Phosphatase 88 38 - 126 U/L   Total Bilirubin 0.9 0.3 - 1.2 mg/dL   GFR calc non Af Amer NOT CALCULATED >60 mL/min   GFR calc Af Amer NOT CALCULATED >60 mL/min    Comment: (NOTE) The eGFR has been calculated using the CKD EPI equation. This calculation has not been validated in all clinical situations. eGFR's persistently <60 mL/min signify possible Chronic Kidney Disease.    Anion gap 7 5 - 15  CBC with Differential/Platelet     Status: Abnormal   Collection Time: 10/25/17  4:27 AM  Result Value Ref  Range   WBC 4.4 4.0 - 10.5 K/uL   RBC 3.77 (L) 4.22 - 5.81 MIL/uL   Hemoglobin 9.9 (L) 13.0 - 17.0 g/dL   HCT 31.6 (L) 39.0 - 52.0 %   MCV 83.8 78.0 - 100.0 fL   MCH 26.3 26.0 - 34.0 pg   MCHC 31.3 30.0 - 36.0 g/dL   RDW 20.4 (H) 11.5 - 15.5 %   Platelets 197 150 - 400 K/uL   Neutrophils Relative % 76 %   Neutro Abs 3.3 1.7 - 7.7 K/uL   Lymphocytes Relative 14 %   Lymphs Abs 0.6 (L) 0.7 - 4.0 K/uL   Monocytes Relative 9 %   Monocytes Absolute 0.4 0.1 - 1.0 K/uL   Eosinophils Relative 1 %   Eosinophils Absolute 0.0 0.0 - 0.7 K/uL   Basophils Relative 0 %   Basophils Absolute 0.0 0.0 - 0.1 K/uL  Magnesium     Status: None   Collection Time: 10/25/17  4:27 AM  Result Value Ref Range   Magnesium 1.7 1.7 - 2.4 mg/dL  Basic metabolic panel      Status: Abnormal   Collection Time: 10/26/17  4:58 AM  Result Value Ref Range   Sodium 138 135 - 145 mmol/L   Potassium 3.3 (L) 3.5 - 5.1 mmol/L   Chloride 107 101 - 111 mmol/L   CO2 26 22 - 32 mmol/L   Glucose, Bld 85 65 - 99 mg/dL   BUN <5 (L) 6 - 20 mg/dL   Creatinine, Ser <0.30 (L) 0.61 - 1.24 mg/dL   Calcium 6.6 (L) 8.9 - 10.3 mg/dL   GFR calc non Af Amer NOT CALCULATED >60 mL/min   GFR calc Af Amer NOT CALCULATED >60 mL/min    Comment: (NOTE) The eGFR has been calculated using the CKD EPI equation. This calculation has not been validated in all clinical situations. eGFR's persistently <60 mL/min signify possible Chronic Kidney Disease.    Anion gap 5 5 - 15  Magnesium     Status: None   Collection Time: 10/26/17  4:58 AM  Result Value Ref Range   Magnesium 2.0 1.7 - 2.4 mg/dL  Phosphorus     Status: None   Collection Time: 10/26/17  4:58 AM  Result Value Ref Range   Phosphorus 2.7 2.5 - 4.6 mg/dL    No results found.  Review of Systems  Musculoskeletal: Positive for back pain.  Neurological: Positive for tingling, sensory change and focal weakness.   Blood pressure 102/81, pulse (!) 116, temperature 98.2 F (36.8 C), temperature source Oral, resp. rate 18, height 6' 2"  (1.88 m), weight 60.3 kg (132 lb 15 oz), SpO2 96 %. Physical Exam  Constitutional: He is oriented to person, place, and time.  Neurological: He is alert and oriented to person, place, and time. He has normal strength. GCS eye subscore is 4. GCS verbal subscore is 5. GCS motor subscore is 6.  Awake alert oriented 3 no neck painful range of motion strength upper 70s 4+ out of 5 lower Sherry strength is also 4+ out of 5 reflexes are normal and symmetric diffuse slight decreased light-touch sensation but overall grossly intact.    Assessment/Plan: 56 year old with a history of lymphoma now with documented lesions and is lumbar and thoracic spine that were noted but back in July but CT scans of both  chest abdomen and soft tissue CT of his neck from last week showed new lytic lesions that were not recognized before it C1-C2 as  well as C3 and C4 but also there is progression of his pathologic fracture at L3. Certainly this is a source of most of his back pain he seems asymptomatic and cervical spine. I recommended continued staging with MRI scans of the cervical thoracic and lumbar spine. This patient is not a surgical candidate. He has had significant medical comorbidities with sepsis,  pulmonary emboli,  multiple procedures and significant extended stays in the hospital that have deconditioned him nutritionally that there is no way he could successfully make a through and heal a spinal decompression fusion operation. Patient is currently considering whether to place a feeding tube. I would recommend continued aggressive medical care he can have a lumbar corset for comfort mobilize with physical occupational therapy and should he get his nutritional status and have a significant response and healing of his stoma then consideration can be given again to whether he is a candidate for surgery. I would recommend maximizing any radiation treatment to his spine that he could be eligible for.  Aftin Lye P 10/26/2017, 6:48 PM

## 2017-10-26 NOTE — Progress Notes (Signed)
PROGRESS NOTE  Cory Ibarra HEN:[id number] DOB: 1960-12-22 DOA: 10/16/2017 PCP: [name], [name]  HPI/Recap of past 24 hours:  Still not eating much, Denies pain, no fever, drain in place Last bm on Monday, he declined stool softener  Assessment/Plan: Active Problems:   Gross hematuria   Hypocalcemia   Severe protein-calorie malnutrition (HCC)   Hypokalemia  Hypocalcemia --Significantly poor p.o. intake -Vitamin D supplement , calcium carbonate , oral mag supplement -Still low calcium this morning despite aggressive calcium replacement daily, corrected calcium today is 7.4, continue iv and oral supplement  Hypokalemia -remain low, continue Aggressive IV replacement and oral supplement, repeat labs in am -Likely due to poor p.o. intake and malnutrition  Severe malnutrition -Note in chart states patient refused Ensure every time it was offered to him yesterday -Per calorie count patient only took in 20% to 25% of meals twice yesterday -Patient promises today he will attempt to increase his intake in hopes of starting to increase his appetite  Sinus tach, tsh 2.2 -Review telemetry -Still intermittently tachycardic  History of B-cell lymphoma - He should followed byDr. [name] -Repeat scans from last week showing improvement in disease -Per Dr.Perlov's note if patient is still hospitalized over the holiday this week he may be transitioned to 3 W. for chemotherapy   Objective uropathy due to kidney stone with nephrostomy tube, hematuria, indwelling foley -A continue to monitor for hematuria given that anticoagulation was started last week - will call urology to discuss definitive plan   Bone lesion "C1, C2, C3, C4 and T1 vertebrae. Associated pathologic fractures of the right C1 lateral mass and the C4 vertebral body." Consulted neurosurgery  Pulmonary emboli -Eliquis restarted on 10/19/2017     DVT prophylaxis: SCDs/eliquis Code Status: DNR Family  Communication: No family bedside Disposition Plan: Pending improvement in electrolytes, may need inpatient chemo, then snf   Consultants:   Urology phone call by emergency department physician  Palliative care  Oncology  neurosurgery  Procedures:   None  Antimicrobials:   None      Objective: BP 101/72 (BP Location: Right Arm)   Pulse (!) 115   Temp 97.9 F (36.6 C) (Oral)   Resp 16   Ht 6\' 2"  (1.88 m)   Wt 60.3 kg (132 lb 15 oz)   SpO2 100%   BMI 17.07 kg/m   Intake/Output Summary (Last 24 hours) at 10/26/2017 0840 Last data filed at 10/26/2017 0800 Gross per 24 hour  Intake 710 ml  Output 2500 ml  Net -1790 ml   Filed Weights   10/16/17 1845  Weight: 60.3 kg (132 lb 15 oz)    Exam: Patient is examined daily including today on 10/26/2017, exams remain the same as of yesterday except that has changed    General:  NAD, frail, cachectic, + chest port, right nephrostomy tube with clear urine,urine in indwelling foley with sediment  Cardiovascular: RRR  Respiratory: CTABL  Abdomen: Soft/ND/NT, positive BS. + drain  Musculoskeletal: left ankle pitting edema, barely able to lift legs against gravity  Neuro: alert, oriented   Data Reviewed: Basic Metabolic Panel: Recent Labs  Lab 10/22/17 1730 10/23/17 0758 10/23/17 1819 10/24/17 0349 10/24/17 1733 10/25/17 0427 10/26/17 0458  NA 137 136 135 138 137 139 138  K 4.7 3.3* 4.0 3.8 3.9 3.3* 3.3*  CL 106 104 101 104 101 105 107  CO2 24 24 26 27 27 27 26   GLUCOSE 119* 80 147* 94 123* 84 85  BUN <5* <5* <  5* <5* <5* <5* <5*  CREATININE <0.30* <0.30* <0.30* <0.30* <0.30* <0.30* <0.30*  CALCIUM 6.3* 5.8* 6.6* 6.5* 7.3* 6.8* 6.6*  MG 2.2 1.8  --  2.0  --  1.7 2.0  PHOS 1.9* 1.4*  --  2.9  --   --  2.7   Liver Function Tests: Recent Labs  Lab 10/22/17 1730 10/23/17 1819 10/24/17 0349 10/24/17 1733 10/25/17 0427  AST 19 18 18 19 18   ALT 14* 14* 14* 15* 12*  ALKPHOS 90 92 88 91 88    BILITOT 0.9 0.6 1.0 0.8 0.9  PROT 4.8* 5.1* 4.9* 5.1* 4.8*  ALBUMIN 2.4* 2.5* 2.4* 2.5* 2.4*   No results for input(s): LIPASE, AMYLASE in the last 168 hours. No results for input(s): AMMONIA in the last 168 hours. CBC: Recent Labs  Lab 10/25/17 0427  WBC 4.4  NEUTROABS 3.3  HGB 9.9*  HCT 31.6*  MCV 83.8  PLT 197   Cardiac Enzymes:   No results for input(s): CKTOTAL, CKMB, CKMBINDEX, TROPONINI in the last 168 hours. BNP (last 3 results) Recent Labs    08/21/17 2249  BNP 607.1*    ProBNP (last 3 results) No results for input(s): PROBNP in the last 8760 hours.  CBG: No results for input(s): GLUCAP in the last 168 hours.  Recent Results (from the past 240 hour(s))  C difficile quick scan w PCR reflex     Status: None   Collection Time: 10/17/17  6:05 PM  Result Value Ref Range Status   C Diff antigen NEGATIVE NEGATIVE Final   C Diff toxin NEGATIVE NEGATIVE Final   C Diff interpretation No C. difficile detected.  Final     Studies: No results found.  Scheduled Meds: . apixaban  5 mg Oral BID  . calcium carbonate  1,000 mg Oral TID  . feeding supplement (ENSURE ENLIVE)  237 mL Oral TID BM  . magnesium oxide  400 mg Oral BID  . multivitamin  15 mL Oral Daily  . OLANZapine zydis  5 mg Oral QHS  . oxyCODONE  10 mg Oral Q12H  . pantoprazole  40 mg Oral Q lunch  . potassium & sodium phosphates  1 packet Oral TID AC & HS  . predniSONE  20 mg Oral Q breakfast  . sulfamethoxazole-trimethoprim  2 tablet Oral Once per day on Mon Wed Fri  . Vitamin D (Ergocalciferol)  50,000 Units Oral Q Mon    Continuous Infusions: . sodium chloride 75 mL/hr at 10/25/17 1254  . calcium gluconate    . potassium chloride       Time spent: 59mins, case discussed with palliative care, oncology and neurosurgery I have personally reviewed and interpreted on  10/26/2017 daily labs, tele strips, imagings as discussed above under date review session and assessment and plans.  I reviewed  all nursing notes, pharmacy notes, consultant notes,  vitals, pertinent old records  I have discussed plan of care as described above with RN , patient  on 10/26/2017   Florencia Reasons MD, PhD  [facility] Pager [phone]. If 7PM-7AM, please contact night-coverage at [email or web address], password [facility] 10/26/2017, 8:40 AM  LOS: 10 days

## 2017-10-27 ENCOUNTER — Inpatient Hospital Stay (HOSPITAL_COMMUNITY): Payer: Medicaid Other

## 2017-10-27 LAB — BASIC METABOLIC PANEL
ANION GAP: 5 (ref 5–15)
BUN: <5 mg/dL — ABNORMAL LOW (ref 6–20)
CALCIUM: 6.9 mg/dL — AB (ref 8.9–10.3)
CO2: 26 mmol/L (ref 22–32)
Chloride: 106 mmol/L (ref 101–111)
Glucose, Bld: 85 mg/dL (ref 65–99)
Potassium: 3.7 mmol/L (ref 3.5–5.1)
Sodium: 137 mmol/L (ref 135–145)

## 2017-10-27 LAB — PHOSPHORUS: PHOSPHORUS: 2.6 mg/dL (ref 2.5–4.6)

## 2017-10-27 LAB — VITAMIN D 25 HYDROXY (VIT D DEFICIENCY, FRACTURES): VIT D 25 HYDROXY: 33.7 ng/mL (ref 30.0–100.0)

## 2017-10-27 LAB — MAGNESIUM: Magnesium: 1.9 mg/dL (ref 1.7–2.4)

## 2017-10-27 MED ORDER — CALCIUM GLUCONATE 10 % IV SOLN
2.0000 g | Freq: Once | INTRAVENOUS | Status: AC
  Administered 2017-10-27: 2 g via INTRAVENOUS
  Filled 2017-10-27: qty 20

## 2017-10-27 MED ORDER — GADOBENATE DIMEGLUMINE 529 MG/ML IV SOLN
15.0000 mL | Freq: Once | INTRAVENOUS | Status: AC | PRN
  Administered 2017-10-27: 12 mL via INTRAVENOUS

## 2017-10-27 MED ORDER — POTASSIUM CHLORIDE 10 MEQ/100ML IV SOLN
10.0000 meq | INTRAVENOUS | Status: AC
  Administered 2017-10-27 (×4): 10 meq via INTRAVENOUS
  Filled 2017-10-27 (×4): qty 100

## 2017-10-27 NOTE — Progress Notes (Signed)
Daily Progress Note   Patient Name: Cory Ibarra       Date: 10/27/2017 DOB: Apr 26, 1961  Age: 56 y.o. MRN#: [id number] Attending Physician: Florencia Reasons, MD Primary Care Physician: [name], [name] Admit Date: 10/16/2017  Reason for Consultation/Follow-up: Non pain symptom management  Subjective: I met with Mr. Delker today to discuss his continued anorexia.    Reports that he had a little more to eat last evening and again at breakfast this AM.  Reports that he is still not feeling hungry, but he is able to tolerate more each day.  Is hopeful that this will be enough nutrition, but noted he has been thinking about PEG tube discussions.   No other concerns today.  Length of Stay: 11  Current Medications: Scheduled Meds:  . apixaban  5 mg Oral BID  . calcium carbonate  1,000 mg Oral TID  . feeding supplement (ENSURE ENLIVE)  237 mL Oral TID BM  . magnesium oxide  400 mg Oral BID  . multivitamin  15 mL Oral Daily  . OLANZapine zydis  5 mg Oral QHS  . oxyCODONE  10 mg Oral Q12H  . pantoprazole  40 mg Oral Q lunch  . potassium & sodium phosphates  1 packet Oral TID AC & HS  . predniSONE  20 mg Oral Q breakfast  . sulfamethoxazole-trimethoprim  2 tablet Oral Once per day on Mon Wed Fri  . Vitamin D (Ergocalciferol)  50,000 Units Oral Q Mon    Continuous Infusions:   PRN Meds: acetaminophen **OR** acetaminophen, iopamidol, labetalol, LORazepam, oxyCODONE, sodium chloride flush  Physical Exam     General: Alert, awake, in no acute distress. Lying in bed.  Pleasant and cooperative but somewhat flat affect. HEENT: No bruits, no goiter, no JVD.  No signs of thrush in oral cavity. Heart: Regular rate and rhythm. No murmur appreciated. Lungs: Good air movement, clear Abdomen: Soft,  nontender, nondistended, positive bowel sounds.  Ext: No significant edema Skin: Warm and dry Neuro: Grossly intact, nonfocal.       Vital Signs: BP 93/77 (BP Location: Left Arm)   Pulse (!) 113   Temp 98.5 F (36.9 C) (Oral)   Resp 18   Ht 6' 2"  (1.88 m)   Wt 60.3 kg (132 lb 15 oz)   SpO2 98%  BMI 17.07 kg/m  SpO2: SpO2: 98 % O2 Device: O2 Device: Not Delivered O2 Flow Rate:    Intake/output summary:   Intake/Output Summary (Last 24 hours) at 10/27/2017 1736 Last data filed at 10/27/2017 1541 Gross per 24 hour  Intake 300 ml  Output 3400 ml  Net -3100 ml   LBM: Last BM Date: 10/22/17 Baseline Weight: Weight: 60.3 kg (132 lb 15 oz) Most recent weight: Weight: 60.3 kg (132 lb 15 oz)       Palliative Assessment/Data:      Patient Active Problem List   Diagnosis Date Noted  . Pressure injury of skin 10/04/2017  . Dehydration   . Hypoxia   . Pulmonary emboli ([place]) 10/03/2017  . Healthcare-associated pneumonia   . Obstructive uropathy   . Liver function test abnormality   . Renal calculus or stone 09/19/2017  . Leakage of nephrostomy catheter ([place]) 09/19/2017  . 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 Gastroesophageal reflux disease without esophagitis 09/19/2017  . Hypokalemia 09/18/2017  . Hypomagnesemia 09/18/2017  . Electrolyte abnormality   . Hypovolemia 09/17/2017  . Chemotherapy-induced nausea 09/17/2017  . Constipation 09/17/2017  . 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 Hypocalcemia 09/17/2017  . Severe protein-calorie malnutrition ([place])   . Adjustment disorder with mixed anxiety and depressed mood   . Palliative care encounter   . Admission for antineoplastic chemotherapy   . Septic shock due to Escherichia coli ([place])   . Neutropenia ([place]) 08/21/2017  . Gross hematuria   . Lactic acidosis   . Neutropenic sepsis ([place])   . Hypophosphatemia 08/20/2017  . Diffuse large B cell lymphoma ([place]) 07/03/2017  . Bony metastasis  ([place]) 06/29/2017  . Chest wall mass   . 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 Closed compression fracture of L3 lumbar vertebra ([place]) 06/28/2017  . Severe back pain 06/28/2017  . 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    Patient is admitted to hospital medicine service with obstructive uropathy with nephrostomy tube, hematuria, also with ongoing electrolyte imbalances such as hypokalemia and hypocalcemia.  Recommendations/Plan:  Anorexia/cachexia: Cancer related. Reports "better" today.  Reports having few more bites of breakfast than yesterday.  Will continue prednisone, zyprexa 75m QHS.  Discussed strategies such as sipping ensure throughout day in order to get in extra calories.  He also reports that he has been considering PEG tube placement.     Goals of Care and Additional Recommendations:  Limitations on Scope of Treatment: Full Scope Treatment  Code Status:    Code Status Orders  (From admission, onward)        Start     Ordered   10/16/17 1557  Do not attempt resuscitation (DNR)  Continuous    Question Answer Comment  In the event of cardiac or respiratory ARREST Do not call a "code blue"   In the event of cardiac or respiratory ARREST Do not perform Intubation, CPR, defibrillation or ACLS   In the event of cardiac or respiratory ARREST Use medication by any route, position, wound care, and other measures to relive pain and suffering. May use oxygen, suction and manual treatment of airway obstruction as needed for comfort.  10/16/17 1557    Code Status History    Date Active Date  Inactive Code Status Order ID Comments User Context   10/03/2017 17:34 10/08/2017 16:55 Partial Code [id number]  Florencia Reasons, MD Inpatient   09/17/2017 17:57 09/25/2017 21:21 Partial Code [id number]  Ardath Sax, MD Inpatient   08/21/2017 20:19 09/03/2017 16:46 Partial Code [id number]  Omar Person, NP Inpatient   08/21/2017 17:18 08/21/2017 20:19 Full Code [id number]  Doreatha Lew, MD Inpatient   06/28/2017 14:21 07/03/2017 19:43 Full Code [id number]  Phillips Grout, MD ED    Advance Directive Documentation     Most Recent Value  Type of Advance Directive  Healthcare Power of Attorney  Pre-existing out of facility DNR order (yellow form or pink MOST form)  No data  "MOST" Form in Place?  No data       Prognosis:   Unable to determine  Discharge Planning:  To Be Determined  Care plan was discussed with patient, bedside RN  Thank you for allowing the Palliative Medicine Team to assist in the care of this patient.   Total Time 20 Prolonged Time Billed No      Greater than 50%  of this time was spent counseling and coordinating care related to the above assessment and plan.  Micheline Rough, MD  Please contact Palliative Medicine Team phone at [phone] for questions and concerns.

## 2017-10-27 NOTE — Progress Notes (Signed)
NEUROSURGERY PROGRESS NOTE  Doing well. Complains of appropriate back soreness but denies neck pain  Temp:  [97.9 F (36.6 C)-98.2 F (36.8 C)] 98.2 F (36.8 C) (11/24 0549) Pulse Rate:  [88-116] 98 (11/24 0549) Resp:  [16-18] 16 (11/24 0549) BP: (102-116)/(64-81) 116/68 (11/24 0549) SpO2:  [96 %-100 %] 100 % (11/24 0549)  Plan: No new nsgy recom. Waiting for an MRI of his neck that he is getting later today.   Eleonore Chiquito, NP 10/27/2017 10:59 AM

## 2017-10-27 NOTE — Progress Notes (Signed)
Pt has returned from MRI to room 1504.

## 2017-10-27 NOTE — Progress Notes (Signed)
PROGRESS NOTE  Bijan Ridgley LNL:[id number] DOB: January 06, 1961 DOA: 10/16/2017 PCP: [name], [name]  HPI/Recap of past 24 hours:  He returned from MRI, report eating more and feeling stronger,   Denies pain, no fever, drain in place Last bm on Monday, he declined stool softener again today, states want to wait for tomorrow  Assessment/Plan: Active Problems:   Gross hematuria   Hypocalcemia   Severe protein-calorie malnutrition (HCC)   Hypokalemia  Hypocalcemia --Significantly poor p.o. intake -Vitamin D supplement , calcium carbonate , oral mag supplement -vitD level 33.7 --Still low calcium this morning despite aggressive calcium replacement daily,  continue iv and oral supplement   Hypokalemia -remain low, continue Aggressive IV replacement and oral supplement, repeat labs in am -Likely due to poor p.o. intake and malnutrition  Severe malnutrition -Note in chart states patient refused Ensure every time it was offered to him yesterday -Per calorie count patient only took in 20% to 25% of meals twice yesterday -oncology Dr [name] discussed with the patient about feeding tube placement, he is thinking about it.   He has some left ankle feet pedal edema Elevate leg, d/c ivf If remain edema , will get venous US, though he is already on anticoagulation.  Sinus tach, tsh 2.2 -Review telemetry -Still intermittently tachycardic  B-cell lymphoma - need improve nutrition and performance status prior to further chemo -plan per oncology  Bone lesion "C1, C2, C3, C4 and T1 vertebrae. Associated pathologic fractures of the right C1 lateral mass and the C4 vertebral body." neurosurgery consulted, input appreciated, repeat mri spine done today per neurosurgery recommendation Will follow neurosurgery recommendations May need more XRT, not likely to be a surgical candidate due to poor nutrition status Lumbar corset ordered by neurosurgery, activity level per  neurosurgery  Objective uropathy due to kidney stone with nephrostomy tube, hematuria, indwelling foley -monitor for hematuria given on anticoagulation  - will need to  call urology to discuss definitive plan   Pulmonary emboli -Eliquis restarted on 10/19/2017     DVT prophylaxis: SCDs/eliquis Code Status: DNR Family Communication: No family bedside Disposition Plan: Pending improvement in electrolytes, may need inpatient chemo, then snf   Consultants:   Urology phone call by emergency department physician  Palliative care  Oncology  neurosurgery  Procedures:   None  Antimicrobials:   None      Objective: BP 116/68 (BP Location: Left Arm)   Pulse 98   Temp 98.2 F (36.8 C) (Oral)   Resp 16   Ht 6\' 2"  (1.88 m)   Wt 60.3 kg (132 lb 15 oz)   SpO2 100%   BMI 17.07 kg/m   Intake/Output Summary (Last 24 hours) at 10/27/2017 0914 Last data filed at 10/27/2017 0550 Gross per 24 hour  Intake 1575 ml  Output 2000 ml  Net -425 ml   Filed Weights   10/16/17 1845  Weight: 60.3 kg (132 lb 15 oz)    Exam: Patient is examined daily including today on 10/27/2017, exams remain the same as of yesterday except that has changed    General:  NAD, frail, cachectic, + chest port, right nephrostomy tube with clear urine,urine in indwelling foley with sediment  Cardiovascular: RRR  Respiratory: CTABL  Abdomen: Soft/ND/NT, positive BS. + drain  Musculoskeletal: left ankle pitting edema, now able to lift legs against gravity up to 30degrees   Neuro: alert, oriented   Data Reviewed: Basic Metabolic Panel: Recent Labs  Lab 10/22/17 1730 10/23/17 0758  10/24/17 0349 10/24/17  1733 10/25/17 0427 10/26/17 0458 10/27/17 0419  NA 137 136   < > 138 137 139 138 137  K 4.7 3.3*   < > 3.8 3.9 3.3* 3.3* 3.7  CL 106 104   < > 104 101 105 107 106  CO2 24 24   < > 27 27 27 26 26   GLUCOSE 119* 80   < > 94 123* 84 85 85  BUN <5* <5*   < > <5* <5* <5* <5*  <5*  CREATININE <0.30* <0.30*   < > <0.30* <0.30* <0.30* <0.30* <0.30*  CALCIUM 6.3* 5.8*   < > 6.5* 7.3* 6.8* 6.6* 6.9*  MG 2.2 1.8  --  2.0  --  1.7 2.0 1.9  PHOS 1.9* 1.4*  --  2.9  --   --  2.7 2.6   < > = values in this interval not displayed.   Liver Function Tests: Recent Labs  Lab 10/22/17 1730 10/23/17 1819 10/24/17 0349 10/24/17 1733 10/25/17 0427  AST 19 18 18 19 18   ALT 14* 14* 14* 15* 12*  ALKPHOS 90 92 88 91 88  BILITOT 0.9 0.6 1.0 0.8 0.9  PROT 4.8* 5.1* 4.9* 5.1* 4.8*  ALBUMIN 2.4* 2.5* 2.4* 2.5* 2.4*   No results for input(s): LIPASE, AMYLASE in the last 168 hours. No results for input(s): AMMONIA in the last 168 hours. CBC: Recent Labs  Lab 10/25/17 0427  WBC 4.4  NEUTROABS 3.3  HGB 9.9*  HCT 31.6*  MCV 83.8  PLT 197   Cardiac Enzymes:   No results for input(s): CKTOTAL, CKMB, CKMBINDEX, TROPONINI in the last 168 hours. BNP (last 3 results) Recent Labs    08/21/17 2249  BNP 607.1*    ProBNP (last 3 results) No results for input(s): PROBNP in the last 8760 hours.  CBG: No results for input(s): GLUCAP in the last 168 hours.  Recent Results (from the past 240 hour(s))  C difficile quick scan w PCR reflex     Status: None   Collection Time: 10/17/17  6:05 PM  Result Value Ref Range Status   C Diff antigen NEGATIVE NEGATIVE Final   C Diff toxin NEGATIVE NEGATIVE Final   C Diff interpretation No C. difficile detected.  Final     Studies: No results found.  Scheduled Meds: . apixaban  5 mg Oral BID  . calcium carbonate  1,000 mg Oral TID  . feeding supplement (ENSURE ENLIVE)  237 mL Oral TID BM  . magnesium oxide  400 mg Oral BID  . multivitamin  15 mL Oral Daily  . OLANZapine zydis  5 mg Oral QHS  . oxyCODONE  10 mg Oral Q12H  . pantoprazole  40 mg Oral Q lunch  . potassium & sodium phosphates  1 packet Oral TID AC & HS  . predniSONE  20 mg Oral Q breakfast  . sulfamethoxazole-trimethoprim  2 tablet Oral Once per day on Mon Wed Fri   . Vitamin D (Ergocalciferol)  50,000 Units Oral Q Mon    Continuous Infusions: . sodium chloride 75 mL/hr at 10/26/17 2213     Time spent: 62mins, case discussed with palliative care,  I have personally reviewed and interpreted on  10/27/2017 daily labs, tele strips, imagings as discussed above under date review session and assessment and plans.  I reviewed all nursing notes, pharmacy notes, consultant notes,  vitals, pertinent old records  I have discussed plan of care as described above with RN , patient  on  10/27/2017   Florencia Reasons MD, PhD  [facility] Pager [phone]. If 7PM-7AM, please contact night-coverage at [email or web address], password [facility] 10/27/2017, 9:14 AM  LOS: 11 days

## 2017-10-27 NOTE — Clinical Social Work Note (Signed)
CSW reviewed chart- no d/c indicated today. CSW services will continue to monitor for stability to return to [facility].  Lorie Phenix. Pauline Good, [place] (weekend coverage)

## 2017-10-27 NOTE — Progress Notes (Signed)
Pt has gone to MRI at this time.

## 2017-10-28 LAB — PROTIME-INR
INR: 1.39
Prothrombin Time: 17 seconds — ABNORMAL HIGH (ref 11.4–15.2)

## 2017-10-28 LAB — BASIC METABOLIC PANEL
Anion gap: 6 (ref 5–15)
BUN: <5 mg/dL — ABNORMAL LOW (ref 6–20)
CHLORIDE: 105 mmol/L (ref 101–111)
CO2: 26 mmol/L (ref 22–32)
Calcium: 6.9 mg/dL — ABNORMAL LOW (ref 8.9–10.3)
Glucose, Bld: 83 mg/dL (ref 65–99)
POTASSIUM: 3.5 mmol/L (ref 3.5–5.1)
SODIUM: 137 mmol/L (ref 135–145)

## 2017-10-28 LAB — PHOSPHORUS: Phosphorus: 2.4 mg/dL — ABNORMAL LOW (ref 2.5–4.6)

## 2017-10-28 LAB — MAGNESIUM: MAGNESIUM: 2 mg/dL (ref 1.7–2.4)

## 2017-10-28 MED ORDER — SODIUM CHLORIDE 0.9 % IV SOLN
2.0000 g | Freq: Once | INTRAVENOUS | Status: AC
  Administered 2017-10-28: 2 g via INTRAVENOUS
  Filled 2017-10-28: qty 20

## 2017-10-28 NOTE — Progress Notes (Signed)
Pt had a friend bring him a 6" meatball sub from [place]. Pt did consume a little over half of the sub without difficulty.

## 2017-10-28 NOTE — Progress Notes (Signed)
PROGRESS NOTE  Cory Ibarra TTS:[id number] DOB: [birth year] DOA: 10/16/2017 PCP: [name], [name]   Brief Summary:   Recently diagnosed with lymphoma s/p two cycles of chemo and xrt,  recent PE on Eliquis, frequent hospitalizations presents on 11/13 with hematuria through nephrostomy tube. Hematuria spontaneously resolved the next day, but he was found to have multiple electrolyte abnormalities with sinus tachycardia.  Very difficult to correct his electrolytes- have been giving significant IV doses repeatedly. Very poor nutritional status and significant deconditioning Multiple spine mets, neurosurgery consulted H/o obstructive right ureteral stone, s/p nephrostomy , has indwelling foley, urology consulted for possible definitive treatment Poor nutrition, oncology has discussed with patient about feeding tube placement for nutrition support in hope of patient getting stronger enough to finish potential curable lymphoma. Patient reports he now eat more, repeat calorie count.   HPI/Recap of past 24 hours:  He reports he is  eating more and feeling stronger,   report chronic back pain from bone mets , no new pain, no fever, drain and foley in place Last bm on Monday, he declined stool softener again today, states wants to wait for tomorrow (he has been saying this for several days)  Assessment/Plan: Active Problems:   Gross hematuria   Hypocalcemia   Severe protein-calorie malnutrition (HCC)   Hypokalemia  Hypocalcemia --Significantly poor p.o. intake -Vitamin D supplement , calcium carbonate , oral mag supplement -vitD level 33.7 --Still low calcium this morning despite aggressive calcium replacement daily,  continue iv and oral supplement   Hypokalemia -Likely due to poor p.o. intake and malnutrition -s/p  Aggressive IV replacement and oral supplement -impropving  Severe malnutrition -oncology Dr [name] discussed with the patient about feeding tube placement on Friday  11/23, patient is leaning towards to placing feeding tube, but he want to discuss with Dr [name] again on Monday.  -today he reports he is eating more, will repeat calorie count.  He has some left ankle /pedal edema Elevate leg, d/c ivf, continue nutrition supplement  he is already on anticoagulation.  Sinus tach, tsh 2.2 -Review telemetry -Still intermittently tachycardic, bp borderline, not able to use betablocker  B-cell lymphoma -S/p two cycles of chemo  -S/p XRT to lumbar spine and chest wall mass in 07/2017 -not able to give denosumab due to persistent hypocalcemia - need improve nutrition and performance status prior to further chemo -plan per oncology  Bone lesions -neurosurgery consulted who recommended repeat mri -MRI spine done on 11/24:  1. Multiple vertebral body, posterior element, rib, sacrum, and left scapula enhancing metastasis. 2. No epidural, leptomeningeal, or cord metastasis. 3. Pathologic mild C4, mild T10, mild T12, and moderate L3 compression deformities. 4. Mild bony retropulsion of L3 vertebral body eccentric to left with mild canal and severe left L3-4 foraminal stenosis. 5. Cystic lesion in right posterior hypopharynx measuring up to 22 mm, direct visualization recommended.   Lumbar corset ordered by neurosurgery, activity level per neurosurgery Will follow neurosurgery recommendations  Pulmonary emboli -Eliquis restarted on 10/19/2017  Objective uropathy due to right ureter stone with nephrostomy tube (last changed by IR on 11/1), hematuria, indwelling foley -he presented with hematuria this admission, hematuria resolved the next day, no fever, no leukocytosis, no urine culture obtained on admission  - case discussed with urology Dr Lovena Neighbours on 11/25, urology will see patient on 11/26 to discuss definitive plan, per Dr Lovena Neighbours nephrostomy tube and indwelling foley need to be changed qmonth if no definitive treatment planned.    Severe  deconditioning/FTT: Overall poor prognosis Palliative care following   DVT prophylaxis: SCDs/eliquis Code Status: DNR Family Communication: No family bedside Disposition Plan: not ready to discharge   Consultants:   Urology  Palliative care  Oncology  Neurosurgery  Procedures:   None  Antimicrobials:   None      Objective: BP 105/70 (BP Location: Left Arm)   Pulse (!) 114   Temp 98.2 F (36.8 C) (Oral)   Resp 18   Ht 6\' 2"  (1.88 m)   Wt 58.7 kg (129 lb 6.6 oz)   SpO2 100%   BMI 16.62 kg/m   Intake/Output Summary (Last 24 hours) at 10/28/2017 0759 Last data filed at 10/28/2017 0539 Gross per 24 hour  Intake 420 ml  Output 2650 ml  Net -2230 ml   Filed Weights   10/16/17 1845 10/27/17 2202  Weight: 60.3 kg (132 lb 15 oz) 58.7 kg (129 lb 6.6 oz)    Exam: Patient is examined daily including today on 10/28/2017, exams remain the same as of yesterday except that has changed    General:  NAD, frail, cachectic, + chest port, right nephrostomy tube with clear urine,urine in indwelling foley with sediment, no gross hematuria  Cardiovascular: RRR  Respiratory: CTABL  Abdomen: Soft/ND/NT, positive BS. + drain  Musculoskeletal: left ankle pitting edema, now able to lift legs against gravity up to 30degrees   Neuro: alert, oriented   Data Reviewed: Basic Metabolic Panel: Recent Labs  Lab 10/23/17 0758  10/24/17 0349 10/24/17 1733 10/25/17 0427 10/26/17 0458 10/27/17 0419 10/28/17 0514  NA 136   < > 138 137 139 138 137 137  K 3.3*   < > 3.8 3.9 3.3* 3.3* 3.7 3.5  CL 104   < > 104 101 105 107 106 105  CO2 24   < > 27 27 27 26 26 26   GLUCOSE 80   < > 94 123* 84 85 85 83  BUN <5*   < > <5* <5* <5* <5* <5* <5*  CREATININE <0.30*   < > <0.30* <0.30* <0.30* <0.30* <0.30* <0.30*  CALCIUM 5.8*   < > 6.5* 7.3* 6.8* 6.6* 6.9* 6.9*  MG 1.8  --  2.0  --  1.7 2.0 1.9 2.0  PHOS 1.4*  --  2.9  --   --  2.7 2.6 2.4*   < > = values in this interval  not displayed.   Liver Function Tests: Recent Labs  Lab 10/22/17 1730 10/23/17 1819 10/24/17 0349 10/24/17 1733 10/25/17 0427  AST 19 18 18 19 18   ALT 14* 14* 14* 15* 12*  ALKPHOS 90 92 88 91 88  BILITOT 0.9 0.6 1.0 0.8 0.9  PROT 4.8* 5.1* 4.9* 5.1* 4.8*  ALBUMIN 2.4* 2.5* 2.4* 2.5* 2.4*   No results for input(s): LIPASE, AMYLASE in the last 168 hours. No results for input(s): AMMONIA in the last 168 hours. CBC: Recent Labs  Lab 10/25/17 0427  WBC 4.4  NEUTROABS 3.3  HGB 9.9*  HCT 31.6*  MCV 83.8  PLT 197   Cardiac Enzymes:   No results for input(s): CKTOTAL, CKMB, CKMBINDEX, TROPONINI in the last 168 hours. BNP (last 3 results) Recent Labs    08/21/17 2249  BNP 607.1*    ProBNP (last 3 results) No results for input(s): PROBNP in the last 8760 hours.  CBG: No results for input(s): GLUCAP in the last 168 hours.  No results found for this or any previous visit (from the past 240 hour(s)).  Studies: Mr Cervical Spine W Wo Contrast  Result Date: 10/27/2017 CLINICAL DATA:  56 y/o  M; diffuse large B-cell lymphoma. EXAM: MRI CERVICAL, THORACIC AND LUMBAR SPINE WITHOUT AND WITH CONTRAST TECHNIQUE: Multiplanar and multiecho pulse sequences of the cervical spine, to include the craniocervical junction and cervicothoracic junction, and thoracic and lumbar spine, were obtained without intravenous contrast. COMPARISON:  10/19/2017 nuclear medicine scan. 10/19/2017 CT of the chest abdomen and pelvis. CONTRAST:  None. FINDINGS: MRI CERVICAL SPINE FINDINGS Alignment: Straightening of cervical lordosis, mild focal reversal of curvature at C4 level. No listhesis. Vertebrae: Enhancing lesions in the right posterior C2 elements, C4 vertebral body, and left C3 facet likely representing metastasis. Mild pathologic fracture of C4 vertebral body with 30% loss of height. Cord: Normal signal and morphology. No cord or leptomeningeal enhancement. No epidural mass. Posterior Fossa, vertebral  arteries, paraspinal tissues: Peripherally enhancing cystic lesion in the right posterior hypopharynx anterior displacing the right aryepiglottic fold measuring 12 x 22 mm (series 31, image 20). Disc levels: C2-3: No significant disc displacement, foraminal stenosis, or canal stenosis. C3-4: No significant disc displacement, foraminal stenosis, or canal stenosis. Mild facet hypertrophy. C4-5: Small disc osteophyte complex with right greater than left uncovertebral and facet hypertrophy. Mild right foraminal stenosis. Disc contact on anterior cord with mild cord flattening. No significant canal stenosis. C5-6: Tiny central disc protrusion. No significant foraminal or canal stenosis. C6-7: No significant disc displacement, foraminal stenosis, or canal stenosis. C7-T1: No significant disc displacement, foraminal stenosis, or canal stenosis. MRI THORACIC SPINE FINDINGS Alignment:  Physiologic. Vertebrae: There several enhancing lesions within the posterior elements, T10, and right T12 vertebral body compatible with metastatic disease. Additionally there are several lesions within the visualized posterior ribs and left scapular tip. Mild loss of endplate height in the right aspect of superior T10 and T12 vertebral bodies right-sided enhancing metastasis compatible with pathologic fracture. Cord: Normal signal and morphology. No cord or leptomeningeal enhancement. No epidural mass. Paraspinal and other soft tissues: Negative. Disc levels: No significant foraminal or canal stenosis. MRI LUMBAR SPINE FINDINGS Segmentation:  Standard. Alignment:  Physiologic. Vertebrae: Moderate L3 vertebral body pathologic compression deformity stable from prior CT of chest abdomen and pelvis. Bony retropulsion is eccentric to the left with severe narrowing of the left neural foramen and mild canal stenosis. Additional enhancing lesion within the right sacral ala and the right ilium. Conus medullaris and cauda equina: Conus extends to the  L1 level. Conus and cauda equina appear normal. Paraspinal and other soft tissues: Negative. Disc levels: Mild L4-5 disc bulge and lower lumbar facet hypertrophy. Mild bilateral L4-5 foraminal stenosis. No significant canal stenosis. IMPRESSION: 1. Multiple vertebral body, posterior element, rib, sacrum, and left scapula enhancing metastasis. 2. No epidural, leptomeningeal, or cord metastasis. 3. Pathologic mild C4, mild T10, mild T12, and moderate L3 compression deformities. 4. Mild bony retropulsion of L3 vertebral body eccentric to left with mild canal and severe left L3-4 foraminal stenosis. 5. Cystic lesion in right posterior hypopharynx measuring up to 22 mm, direct visualization recommended. Electronically Signed   By: Kristine Garbe M.D.   On: 10/27/2017 15:49   Mr Thoracic Spine W Wo Contrast  Result Date: 10/27/2017 CLINICAL DATA:  56 y/o  M; diffuse large B-cell lymphoma. EXAM: MRI CERVICAL, THORACIC AND LUMBAR SPINE WITHOUT AND WITH CONTRAST TECHNIQUE: Multiplanar and multiecho pulse sequences of the cervical spine, to include the craniocervical junction and cervicothoracic junction, and thoracic and lumbar spine, were obtained without intravenous contrast. COMPARISON:  10/19/2017  nuclear medicine scan. 10/19/2017 CT of the chest abdomen and pelvis. CONTRAST:  None. FINDINGS: MRI CERVICAL SPINE FINDINGS Alignment: Straightening of cervical lordosis, mild focal reversal of curvature at C4 level. No listhesis. Vertebrae: Enhancing lesions in the right posterior C2 elements, C4 vertebral body, and left C3 facet likely representing metastasis. Mild pathologic fracture of C4 vertebral body with 30% loss of height. Cord: Normal signal and morphology. No cord or leptomeningeal enhancement. No epidural mass. Posterior Fossa, vertebral arteries, paraspinal tissues: Peripherally enhancing cystic lesion in the right posterior hypopharynx anterior displacing the right aryepiglottic fold measuring 12 x  22 mm (series 31, image 20). Disc levels: C2-3: No significant disc displacement, foraminal stenosis, or canal stenosis. C3-4: No significant disc displacement, foraminal stenosis, or canal stenosis. Mild facet hypertrophy. C4-5: Small disc osteophyte complex with right greater than left uncovertebral and facet hypertrophy. Mild right foraminal stenosis. Disc contact on anterior cord with mild cord flattening. No significant canal stenosis. C5-6: Tiny central disc protrusion. No significant foraminal or canal stenosis. C6-7: No significant disc displacement, foraminal stenosis, or canal stenosis. C7-T1: No significant disc displacement, foraminal stenosis, or canal stenosis. MRI THORACIC SPINE FINDINGS Alignment:  Physiologic. Vertebrae: There several enhancing lesions within the posterior elements, T10, and right T12 vertebral body compatible with metastatic disease. Additionally there are several lesions within the visualized posterior ribs and left scapular tip. Mild loss of endplate height in the right aspect of superior T10 and T12 vertebral bodies right-sided enhancing metastasis compatible with pathologic fracture. Cord: Normal signal and morphology. No cord or leptomeningeal enhancement. No epidural mass. Paraspinal and other soft tissues: Negative. Disc levels: No significant foraminal or canal stenosis. MRI LUMBAR SPINE FINDINGS Segmentation:  Standard. Alignment:  Physiologic. Vertebrae: Moderate L3 vertebral body pathologic compression deformity stable from prior CT of chest abdomen and pelvis. Bony retropulsion is eccentric to the left with severe narrowing of the left neural foramen and mild canal stenosis. Additional enhancing lesion within the right sacral ala and the right ilium. Conus medullaris and cauda equina: Conus extends to the L1 level. Conus and cauda equina appear normal. Paraspinal and other soft tissues: Negative. Disc levels: Mild L4-5 disc bulge and lower lumbar facet hypertrophy. Mild  bilateral L4-5 foraminal stenosis. No significant canal stenosis. IMPRESSION: 1. Multiple vertebral body, posterior element, rib, sacrum, and left scapula enhancing metastasis. 2. No epidural, leptomeningeal, or cord metastasis. 3. Pathologic mild C4, mild T10, mild T12, and moderate L3 compression deformities. 4. Mild bony retropulsion of L3 vertebral body eccentric to left with mild canal and severe left L3-4 foraminal stenosis. 5. Cystic lesion in right posterior hypopharynx measuring up to 22 mm, direct visualization recommended. Electronically Signed   By: Kristine Garbe M.D.   On: 10/27/2017 15:49   Mr Lumbar Spine W Wo Contrast  Result Date: 10/27/2017 CLINICAL DATA:  56 y/o  M; diffuse large B-cell lymphoma. EXAM: MRI CERVICAL, THORACIC AND LUMBAR SPINE WITHOUT AND WITH CONTRAST TECHNIQUE: Multiplanar and multiecho pulse sequences of the cervical spine, to include the craniocervical junction and cervicothoracic junction, and thoracic and lumbar spine, were obtained without intravenous contrast. COMPARISON:  10/19/2017 nuclear medicine scan. 10/19/2017 CT of the chest abdomen and pelvis. CONTRAST:  None. FINDINGS: MRI CERVICAL SPINE FINDINGS Alignment: Straightening of cervical lordosis, mild focal reversal of curvature at C4 level. No listhesis. Vertebrae: Enhancing lesions in the right posterior C2 elements, C4 vertebral body, and left C3 facet likely representing metastasis. Mild pathologic fracture of C4 vertebral body with 30% loss of  height. Cord: Normal signal and morphology. No cord or leptomeningeal enhancement. No epidural mass. Posterior Fossa, vertebral arteries, paraspinal tissues: Peripherally enhancing cystic lesion in the right posterior hypopharynx anterior displacing the right aryepiglottic fold measuring 12 x 22 mm (series 31, image 20). Disc levels: C2-3: No significant disc displacement, foraminal stenosis, or canal stenosis. C3-4: No significant disc displacement,  foraminal stenosis, or canal stenosis. Mild facet hypertrophy. C4-5: Small disc osteophyte complex with right greater than left uncovertebral and facet hypertrophy. Mild right foraminal stenosis. Disc contact on anterior cord with mild cord flattening. No significant canal stenosis. C5-6: Tiny central disc protrusion. No significant foraminal or canal stenosis. C6-7: No significant disc displacement, foraminal stenosis, or canal stenosis. C7-T1: No significant disc displacement, foraminal stenosis, or canal stenosis. MRI THORACIC SPINE FINDINGS Alignment:  Physiologic. Vertebrae: There several enhancing lesions within the posterior elements, T10, and right T12 vertebral body compatible with metastatic disease. Additionally there are several lesions within the visualized posterior ribs and left scapular tip. Mild loss of endplate height in the right aspect of superior T10 and T12 vertebral bodies right-sided enhancing metastasis compatible with pathologic fracture. Cord: Normal signal and morphology. No cord or leptomeningeal enhancement. No epidural mass. Paraspinal and other soft tissues: Negative. Disc levels: No significant foraminal or canal stenosis. MRI LUMBAR SPINE FINDINGS Segmentation:  Standard. Alignment:  Physiologic. Vertebrae: Moderate L3 vertebral body pathologic compression deformity stable from prior CT of chest abdomen and pelvis. Bony retropulsion is eccentric to the left with severe narrowing of the left neural foramen and mild canal stenosis. Additional enhancing lesion within the right sacral ala and the right ilium. Conus medullaris and cauda equina: Conus extends to the L1 level. Conus and cauda equina appear normal. Paraspinal and other soft tissues: Negative. Disc levels: Mild L4-5 disc bulge and lower lumbar facet hypertrophy. Mild bilateral L4-5 foraminal stenosis. No significant canal stenosis. IMPRESSION: 1. Multiple vertebral body, posterior element, rib, sacrum, and left scapula  enhancing metastasis. 2. No epidural, leptomeningeal, or cord metastasis. 3. Pathologic mild C4, mild T10, mild T12, and moderate L3 compression deformities. 4. Mild bony retropulsion of L3 vertebral body eccentric to left with mild canal and severe left L3-4 foraminal stenosis. 5. Cystic lesion in right posterior hypopharynx measuring up to 22 mm, direct visualization recommended. Electronically Signed   By: Kristine Garbe M.D.   On: 10/27/2017 15:49    Scheduled Meds: . apixaban  5 mg Oral BID  . calcium carbonate  1,000 mg Oral TID  . feeding supplement (ENSURE ENLIVE)  237 mL Oral TID BM  . magnesium oxide  400 mg Oral BID  . multivitamin  15 mL Oral Daily  . OLANZapine zydis  5 mg Oral QHS  . oxyCODONE  10 mg Oral Q12H  . pantoprazole  40 mg Oral Q lunch  . potassium & sodium phosphates  1 packet Oral TID AC & HS  . predniSONE  20 mg Oral Q breakfast  . sulfamethoxazole-trimethoprim  2 tablet Oral Once per day on Mon Wed Fri  . Vitamin D (Ergocalciferol)  50,000 Units Oral Q Mon    Continuous Infusions: . calcium gluconate       Time spent: 26mins, case discussed with urology Dr Lovena Neighbours I have personally reviewed and interpreted on  10/28/2017 daily labs, tele strips, imagings as discussed above under date review session and assessment and plans.  I reviewed all nursing notes, pharmacy notes, consultant notes,  vitals, pertinent old records  I have discussed plan of  care as described above with RN , patient  on 10/28/2017   Florencia Reasons MD, PhD  [facility] Pager [phone]. If 7PM-7AM, please contact night-coverage at [email or web address], password [facility] 10/28/2017, 7:59 AM  LOS: 12 days

## 2017-10-29 LAB — BASIC METABOLIC PANEL
Anion gap: 6 (ref 5–15)
BUN: 6 mg/dL (ref 6–20)
CO2: 26 mmol/L (ref 22–32)
Calcium: 7 mg/dL — ABNORMAL LOW (ref 8.9–10.3)
Chloride: 106 mmol/L (ref 101–111)
GLUCOSE: 86 mg/dL (ref 65–99)
POTASSIUM: 3.4 mmol/L — AB (ref 3.5–5.1)
SODIUM: 138 mmol/L (ref 135–145)

## 2017-10-29 LAB — MAGNESIUM: Magnesium: 1.9 mg/dL (ref 1.7–2.4)

## 2017-10-29 LAB — PHOSPHORUS: Phosphorus: 3.3 mg/dL (ref 2.5–4.6)

## 2017-10-29 MED ORDER — GADOBENATE DIMEGLUMINE 529 MG/ML IV SOLN
15.0000 mL | Freq: Once | INTRAVENOUS | Status: AC | PRN
  Administered 2017-10-29: 12 mL via INTRAVENOUS

## 2017-10-29 NOTE — Progress Notes (Addendum)
Patient ID: Cory Ibarra, male   DOB: 1961/07/18, 56 y.o.   MRN: [id number]  PROGRESS NOTE    Gurley Climer  EXH:[id number] DOB: 06-Apr-1961 DOA: 10/16/2017  PCP: [name], [name]   Brief Narrative:  56 year old male with recently diagnosed lymphoma, s/p 2 cycles of chemo and underwent XRT, right ureteral stone and s/p nephrostomy, recent PE and now on apixaban. He present to ED with hematuria through nephrostomy tube. Hematuria spontaneously resolved. He was however found to have multiple electrolyte derangements. In addition, his nutritional status is very poor but he was not ready to have feeding tube placed. Palliative is assisting with goals of care.   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  - S/p 2 cycles of chemo - S/p XRT to lumbar spine and chest wall mass in 07/2017 - Cannot get denosumab due to persistent hypocalcemia  Bone metastases - Evident on MRI spine - No cord metastases - He has mild C4, T10, T12 and L3 compression deformities - Lumbar corset ordered by neurosurgery   PE - On Apixaban  Right ureteral stone / Uropathy with nephrostomy tube / Hematuria - Last changed by IR 11/1 - He presented with hematuria but hematuria resolved now - Per Urology, nephrostomy and foley needs to be changed every month if no definitive treatment planned  Severe protein calorie malnutrition - Liberalize the diet as tolerated - Did not really want feeding tube   Severe deconditioning / Failure to thrive in adult - Palliative care following      DVT prophylaxis: On Apixaban Code Status: DNR/DNI Family Communication: no family at the bedside Disposition Plan: not yet stable for discharge     Consultants:   Urology  Palliative care  Oncology  Neurosurgery   Procedures:   None   Antimicrobials:   None    Subjective: No overnight events.  Objective: Vitals:   10/28/17 0653 10/28/17 1500 10/28/17 2140 10/29/17 0557  BP: 105/70 107/69 103/66 107/60  Pulse: (!) 114 (!) 119 87 96  Resp: 18 18 18 18   Temp: 98.2 F (36.8 C) 98.1 F (36.7 C) 97.8 F (36.6 C) 98.4 F (36.9 C)  TempSrc: Oral Oral Oral Oral  SpO2: 100% 94% 100% 98%  Weight:      Height:        Intake/Output Summary (Last 24 hours) at 10/29/2017 1206 Last data filed at 10/29/2017 0719 Gross per 24 hour  Intake 220 ml  Output 585 ml  Net -365 ml   Filed Weights   10/16/17 1845 10/27/17 2202  Weight: 60.3 kg (132 lb 15 oz) 58.7 kg (129 lb 6.6 oz)    Examination:  General exam: Appears calm and comfortable  Respiratory system: Clear to auscultation. Respiratory effort normal. Cardiovascular system: S1 & S2 heard, RRR. Gastrointestinal system: Abdomen is nondistended, soft and nontender. No organomegaly or masses felt. Normal bowel sounds heard. Central nervous system: No focal neurological deficits. Extremities: Symmetric 5 x 5 power. Pedal edema Skin: warm, dry Psychiatry: Flat affect, no agitation   Data Reviewed: I have personally reviewed following labs and imaging studies  CBC: Recent Labs  Lab 10/25/17 0427  WBC 4.4  NEUTROABS 3.3  HGB 9.9*  HCT 31.6*  MCV 83.8  PLT 160   Basic Metabolic Panel: Recent Labs  Lab 10/24/17 0349  10/25/17 0427 10/26/17 0458 10/27/17 0419 10/28/17 0514 10/29/17 0411  NA 138   < > 139 138 137 137 138  K 3.8   < > 3.3* 3.3* 3.7 3.5 3.4*  CL 104   < > 105 107 106 105 106  CO2 27   < > 27 26 26 26 26   GLUCOSE 94   < > 84 85 85 83 86  BUN <5*   < > <5* <5* <5* <5* 6  CREATININE <0.30*   < > <0.30* <0.30* <0.30* <0.30* <0.30*  CALCIUM 6.5*   < > 6.8* 6.6* 6.9* 6.9* 7.0*  MG 2.0  --  1.7 2.0 1.9 2.0 1.9  PHOS 2.9  --   --  2.7  2.6 2.4* 3.3   < > = values in this interval not displayed.   GFR: CrCl cannot be calculated (This lab value cannot be used to calculate CrCl because it is not a number: <0.30). Liver Function Tests: Recent Labs  Lab 10/22/17 1730 10/23/17 1819 10/24/17 0349 10/24/17 1733 10/25/17 0427  AST 19 18 18 19 18   ALT 14* 14* 14* 15* 12*  ALKPHOS 90 92 88 91 88  BILITOT 0.9 0.6 1.0 0.8 0.9  PROT 4.8* 5.1* 4.9* 5.1* 4.8*  ALBUMIN 2.4* 2.5* 2.4* 2.5* 2.4*   No results for input(s): LIPASE, AMYLASE in the last 168 hours. No results for input(s): AMMONIA in the last 168 hours. Coagulation Profile: Recent Labs  Lab 10/28/17 0514  INR 1.39   Cardiac Enzymes: No results for input(s): CKTOTAL, CKMB, CKMBINDEX, TROPONINI in the last 168 hours. BNP (last 3 results) No results for input(s): PROBNP in the last 8760 hours. HbA1C: No results for input(s): HGBA1C in the last 72 hours. CBG: No results for input(s): GLUCAP in the last 168 hours. Lipid Profile: No results for input(s): CHOL, HDL, LDLCALC, TRIG, CHOLHDL, LDLDIRECT in the last 72 hours. Thyroid Function Tests: No results for input(s): TSH, T4TOTAL, FREET4, T3FREE, THYROIDAB in the last 72 hours. Anemia Panel: No results for input(s): VITAMINB12, FOLATE, FERRITIN, TIBC, IRON, RETICCTPCT in the last 72 hours. Urine analysis:    Component Value Date/Time   COLORURINE YELLOW 10/16/2017 1410   APPEARANCEUR CLOUDY (A) 10/16/2017 1410   LABSPEC 1.023 10/16/2017 1410   PHURINE 5.0 10/16/2017 1410   GLUCOSEU NEGATIVE 10/16/2017 1410   HGBUR LARGE (A) 10/16/2017 1410   BILIRUBINUR NEGATIVE 10/16/2017 1410   KETONESUR 80 (A) 10/16/2017 1410   PROTEINUR 100 (A) 10/16/2017 1410   UROBILINOGEN 1.0 07/06/2017 1029   NITRITE NEGATIVE 10/16/2017 1410   LEUKOCYTESUR LARGE (A) 10/16/2017 1410   Sepsis Labs: @LABRCNTIP (procalcitonin:4,lacticidven:4)   )No results found for this or any previous visit (from the past 240 hour(s)).     Radiology Studies: Mr Cervical Spine W Wo Contrast Result Date: 10/27/2017 1. Multiple vertebral body, posterior element, rib, sacrum, and left scapula enhancing metastasis. 2. No epidural, leptomeningeal, or cord metastasis. 3. Pathologic mild C4, mild T10, mild T12, and moderate L3 compression deformities. 4. Mild bony retropulsion of L3 vertebral body eccentric to left with mild canal and severe left L3-4 foraminal stenosis. 5. Cystic lesion in right posterior hypopharynx measuring up to 22 mm, direct visualization recommended.   Mr Thoracic Spine W Wo Contrast Result Date: 10/27/2017 1. Multiple vertebral body, posterior element, rib, sacrum, and left scapula enhancing metastasis. 2. No epidural, leptomeningeal, or cord metastasis. 3.  Pathologic mild C4, mild T10, mild T12, and moderate L3 compression deformities. 4. Mild bony retropulsion of L3 vertebral body eccentric to left with mild canal and severe left L3-4 foraminal stenosis. 5. Cystic lesion in right posterior hypopharynx measuring up to 22 mm, direct visualization recommended.   Mr Lumbar Spine W Wo Contrast Result Date: 10/27/2017 1. Multiple vertebral body, posterior element, rib, sacrum, and left scapula enhancing metastasis. 2. No epidural, leptomeningeal, or cord metastasis. 3. Pathologic mild C4, mild T10, mild T12, and moderate L3 compression deformities. 4. Mild bony retropulsion of L3 vertebral body eccentric to left with mild canal and severe left L3-4 foraminal stenosis. 5. Cystic lesion in right posterior hypopharynx measuring up to 22 mm, direct visualization recommended.    Scheduled Meds: . apixaban  5 mg Oral BID  . calcium carbonate  1,000 mg Oral TID  . feeding supplement   237 mL Oral TID BM  . magnesium oxide  400 mg Oral BID  . multivitamin  15 mL Oral Daily  . OLANZapine zydis  5 mg Oral QHS  . oxyCODONE  10 mg Oral Q12H  . pantoprazole  40 mg Oral Q lunch  . potassium & sodiu  1 packet Oral TID AC &  HS  . predniSONE  20 mg Oral Q breakfast  . sulfamethoxazole-tr  2 tablet Oral Mon Wed Fri  . Vitamin D   50,000 Units Oral Q Mon   Continuous Infusions:   LOS: 13 days    Time spent: 25 minutes  Greater than 50% of the time spent on counseling and coordinating the care.   Leisa Lenz, MD [facility] Pager [phone]  If 7PM-7AM, please contact night-coverage [email or web address] Password [facility] 10/29/2017, 12:06 PM

## 2017-10-29 NOTE — Progress Notes (Signed)
Patient will be NPO at midnight 10/29/17 for possibly PEG tube tomorrow as discussed with oncologist today.

## 2017-10-29 NOTE — Progress Notes (Signed)
IP PROGRESS NOTE  Subjective:  Over the weekend, patient is reported to have taken a lot more food than previous days.  Nevertheless, he now agrees to receive placement of PEG tube.  Otherwise, denies any new complaints.  Objective: Vital signs in last 24 hours: Blood pressure 102/63, pulse (!) 107, temperature 98.3 F (36.8 C), temperature source Oral, resp. rate 18, height 6\' 2"  (1.88 m), weight 129 lb 6.6 oz (58.7 kg), SpO2 93 %.  Intake/Output from previous day: 11/25 0701 - 11/26 0700 In: 220 [P.O.:120] Out: 1085 [Urine:825; Drains:260]  Physical Exam:  Patient is alert, awake, oriented x3.  No acute distress. HEENT: Muscle wasting, no oral mucosal lesions. Lungs: Clear to auscultation bilaterally Cardiac: S1/S2, regular, no murmurs Abdomen: Soft, nontender nondistended.  Bowel sounds are normoactive. Extremities: No lower extremity edema Portacath/PICC-without erythema.  Right nephrostomy in place draining clear yellow urine.   Lab Results: No results for input(s): WBC, HGB, HCT, PLT in the last 72 hours.  BMET Recent Labs    10/28/17 0514 10/29/17 0411  NA 137 138  K 3.5 3.4*  CL 105 106  CO2 26 26  GLUCOSE 83 86  BUN <5* 6  CREATININE <0.30* <0.30*  CALCIUM 6.9* 7.0*    Lab Results  Component Value Date   CEA1 0.7 06/28/2017    Studies/Results: No results found.  Medications: I have reviewed the patient's current medications.  Assessment/Plan: 56 y.o. 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  Severe calorie/protein malnutrition and remarkable and persistent electrolyte abnormalities.  Overall, patient continues to require significant amount of medical care.  He  has recurrent hospitalizations for the previously described reasons.  Today, I have discussed the difficult time that we are having keeping him out of the hospital as well as the high risk of continuation of systemic therapy for his malignancy.  It is hard to withhold therapy from potentially curable cancer, and patient is insistent on continuing attempting to treat him with systemic chemotherapy.  **Lymphoma: We will reassess disease with CT of the chest/abdomen/pelvis and bone scan in lieu of obtaining PET/CT as patient remains hospitalized at this time.  If disease is responding to therapy, we do have better reason to continue attempting treatments.  If there is progressive disease, patient needs to be transitioned to supportive care at this time as he is unlikely to tolerate the second lines of chemotherapy for the lymphoma indication.  Restaging imaging with CT of the neck, chest, abdomen, pelvis and bone scan demonstrated positive response to previous therapy even with reduced intensity of the second cycle of chemo and delay in the administration.. --Current severe combined calorie/protein malnutrition precludes renegotiation of systemic chemotherapy without adequate nutrition support. At this time, it is unlikely that patient will resume adequate intake on his own. My recommendation is to proceed with placement of nasogastric or percutaneous gastric tube to provide enteral continuous nutrition or intermittent gastric nutritional supplementation. Patient is understandably apprehensive, but without at least a weeklong supply, I am unable to proceed with any further chemotherapy due to higher likelihood of further deterioration of performance status and potential increase chances of dying.  **Electrolytes/Nutrition: Severe mixed calorie/protein malnutrition with a good portion of it having to do with complete lack of appetite and effort on the patient's behalf to improve  his nutrition.  I have discussed  alternative of enteral nutrition via nasogastric or percutaneous gastric tube versus parenteral nutrition through the vein.  In the context of recurrent infections, parenteral nutrition will have significant risk of causing severe infectious complications including fungal infections and I do not advocate for total parenteral nutrition at this time.  Patient is very apprehensive about tube feeds and promises to make himself eat more at this time. --Patient agreed with PEGT placement tomorrow. Please arrange with the IR if not already scheduled.  --Recommend continued follow-up by nutrition services with calorie counts and we will need additional recommendations regarding tube feeds --Continue electrolyte replacement therapy by IV or by mouth as needed based on his daily labs.  **Disposition: Discharged from the hospital discretion of the primary treating service.  Whether patient returns back to skilled nursing facility or home will depend on opinion of physical/occupational therapy and patient's wishes.    LOS: 13 days   Ardath Sax, MD   10/29/2017, 4:59 PM

## 2017-10-29 NOTE — Progress Notes (Signed)
Nutrition Follow-up  DOCUMENTATION CODES:   Severe malnutrition in context of chronic illness, Underweight  INTERVENTION:   -Calorie Count -48 hours ordered per MD (11/26-11/27) -Continue Ensure Enlive po TID, each supplement provides 350 kcal and 20 grams of protein -Continue liquid MVI  If patient approves PEG placement , will provide TF recommendations.   NUTRITION DIAGNOSIS:   Severe Malnutrition related to cancer and cancer related treatments, chronic illness as evidenced by percent weight loss, energy intake < or equal to 75% for > or equal to 1 month, severe fat depletion, moderate muscle depletion.  Ongoing.  GOAL:   Patient will meet greater than or equal to 90% of their needs  Not meeting.  MONITOR:   PO intake, Supplement acceptance, Labs, Weight trends, Skin, I & O's  REASON FOR ASSESSMENT:   Consult Calorie Count  ASSESSMENT:   56 y.o. 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  Of note, patient was recently diagnosed with pulmonary embolism and has been continued on Eliquis prior to admission.  During workup, patient was also noted to be tachycardic and was subsequently referred to the emergency department for further workup.  Per MD note, pt now considering PEG placement. Reporting that he is eating better. MD ordered 48 hour Calorie Count. Will provide results in a separate note. Pt not consuming Ensure supplements consistently. Per RN note 11/25, pt consumed 1/2 of a 6" [employer] meatball sub (~225 kcal, 10g protein).    Medications: Tums tablet TID, MAG-OX tablet BID, Liquid MVI daily, Protonix tablet daily, PHOS-NAK packet QID, Vitamin D capsule weekly  Labs reviewed: Low K Mg/Phos WNL  Diet Order:  Diet regular Room service appropriate?  Yes; Fluid consistency: Thin  EDUCATION NEEDS:   Education needs have been addressed  Skin:  Skin Assessment: Skin Integrity Issues: Skin Integrity Issues:: Stage II Stage II: ischial tuberosity  Last BM:  11/23  Height:   Ht Readings from Last 1 Encounters:  10/16/17 6\' 2"  (1.88 m)    Weight:   Wt Readings from Last 1 Encounters:  10/27/17 129 lb 6.6 oz (58.7 kg)    Ideal Body Weight:  86.36 kg  BMI:  Body mass index is 16.62 kg/m.  Estimated Nutritional Needs:   Kcal:  2150-2350  Protein:  115-125g  Fluid:  2.2L/day  Clayton Bibles, MS, RD, LDN [place] [facility] Pager: [phone] After Hours Pager: [phone]

## 2017-10-30 LAB — BASIC METABOLIC PANEL
ANION GAP: 6 (ref 5–15)
BUN: 5 mg/dL — ABNORMAL LOW (ref 6–20)
CALCIUM: 6.6 mg/dL — AB (ref 8.9–10.3)
CO2: 25 mmol/L (ref 22–32)
Chloride: 104 mmol/L (ref 101–111)
Creatinine, Ser: <0.3 mg/dL — ABNORMAL LOW (ref 0.61–1.24)
GLUCOSE: 86 mg/dL (ref 65–99)
Potassium: 3.5 mmol/L (ref 3.5–5.1)
Sodium: 135 mmol/L (ref 135–145)

## 2017-10-30 LAB — CBC
HEMATOCRIT: 31.2 % — AB (ref 39.0–52.0)
Hemoglobin: 9.9 g/dL — ABNORMAL LOW (ref 13.0–17.0)
MCH: 26.6 pg (ref 26.0–34.0)
MCHC: 31.7 g/dL (ref 30.0–36.0)
MCV: 83.9 fL (ref 78.0–100.0)
Platelets: 226 10*3/uL (ref 150–400)
RBC: 3.72 MIL/uL — ABNORMAL LOW (ref 4.22–5.81)
RDW: 19.6 % — ABNORMAL HIGH (ref 11.5–15.5)
WBC: 4.8 10*3/uL (ref 4.0–10.5)

## 2017-10-30 LAB — MAGNESIUM: Magnesium: 2 mg/dL (ref 1.7–2.4)

## 2017-10-30 LAB — PHOSPHORUS: Phosphorus: 2.4 mg/dL — ABNORMAL LOW (ref 2.5–4.6)

## 2017-10-30 MED ORDER — SODIUM CHLORIDE 0.9 % IV SOLN
2.0000 g | Freq: Once | INTRAVENOUS | Status: AC
  Administered 2017-10-30: 2 g via INTRAVENOUS
  Filled 2017-10-30: qty 20

## 2017-10-30 NOTE — Progress Notes (Signed)
Patient not medically ready to return to SNF.   LCSW will continue to follow for return to facility.   Carolin Coy [place] [facility] [place]

## 2017-10-30 NOTE — Progress Notes (Signed)
Date: October 30, 2017 Jaaliyah Lucatero, BSN, RN3, CCM  [phone] Chart and notes review for patient progress and needs. Will follow for case management and discharge needs. Next review date: [id number] 

## 2017-10-30 NOTE — Progress Notes (Signed)
Patient ID: Cory Ibarra, male   DOB: Sep 08, 1961, 56 y.o.   MRN: [id number]  PROGRESS NOTE    Cory Ibarra  YFV:[id number] DOB: 06/14/1961 DOA: 10/16/2017  PCP: [name], [name]   Brief Narrative:  49 year olf male with recently diagnosed lymphoma, s/p 2 cycles of chemo and underwent XRT, right ureteral stone and s/p nephrostomy, recent PE and now on apixaban. He present to ED with hematuria through nephrostomy tube. Hematuria spontaneously resolved. He was however found to have multiple electrolyte derangements. In addition, his nutritional status is very poor. Palliative is assisting with goals of care.   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 - S/p XRT to lumbar spine and chest wall mass in 07/2017 - Cannot get denosumab due to persistent hypocalcemia - Management per oncology   Bone metastases - Evident on MRI spine - No cord metastases - He has mild C4, T10, T12 and L3 compression deformities - Lumbar corset ordered by neurosurgery  - PT eval if pt able to participate   PE - Continue apixaban  Right ureteral stone / Uropathy with nephrostomy tube / Hematuria - Last changed by IR 11/1 - He presented with hematuria but hematuria resolved now - Per Urology, nephrostomy and foley needs to be changed every month if no definitive treatment planned  Severe protein calorie malnutrition - Plan for feeding tube placement   Severe deconditioning / Failure to thrive in adult - Appreciate palliative care assisting with goals of care     DVT prophylaxis: Pt on apixaban Code Status: DNR/DNI Family Communication: no family at the bedside Disposition Plan: plan for FT placement    Consultants:   Urology  Palliative care  Oncology  Neurosurgery   Procedures:    None   Antimicrobials:   None   Subjective: In better spirit this am. No overnight events.  Objective: Vitals:   10/29/17 1432 10/29/17 2134 10/30/17 0554 10/30/17 1354  BP: 102/63 112/73 93/66 104/77  Pulse: (!) 107 (!) 102 (!) 110 (!) 110  Resp: 18 18 17 19   Temp: 98.3 F (36.8 C) 98.1 F (36.7 C) 97.6 F (36.4 C) 98.1 F (36.7 C)  TempSrc: Oral Oral Oral Oral  SpO2: 93% 96% 99% 94%  Weight:      Height:        Intake/Output Summary (Last 24 hours) at 10/30/2017 1526 Last data filed at 10/30/2017 1419 Gross per 24 hour  Intake 250 ml  Output 1290 ml  Net -1040 ml   Filed Weights   10/16/17 1845 10/27/17 2202  Weight: 60.3 kg (132 lb 15 oz) 58.7 kg (129 lb 6.6 oz)    Physical Exam  Constitutional: Appears well-developed and well-nourished. No distress.  HENT: Normocephalic. External right and left ear normal. Oropharynx is clear and moist.  Eyes: Conjunctivae and EOM are normal. PERRLA, no scleral icterus.  Neck: Normal ROM. Neck supple. No JVD. No tracheal deviation. No thyromegaly.  CVS: RRR, S1/S2 + Pulmonary: Effort and breath sounds normal, no stridor, rhonchi, wheezes, rales.  Abdominal: Soft. BS +,  no distension, tenderness, rebound or guarding.  Musculoskeletal: Normal range of motion. No edema and no tenderness.  Lymphadenopathy: No lymphadenopathy noted, cervical, inguinal. Neuro: Alert. Normal reflexes, muscle tone coordination. No cranial nerve deficit. Skin:  Skin is warm and dry.  Psychiatric: Normal mood and affect. Behavior, judgment, thought content normal.      Data Reviewed: I have personally reviewed following labs and imaging studies  CBC: Recent Labs  Lab 10/25/17 0427 10/30/17 0443  WBC 4.4 4.8  NEUTROABS 3.3  --   HGB 9.9* 9.9*  HCT 31.6* 31.2*  MCV 83.8 83.9  PLT 197 161   Basic Metabolic Panel: Recent Labs  Lab 10/26/17 0458 10/27/17 0419 10/28/17 0514 10/29/17 0411 10/30/17 0443  NA 138 137 137 138 135  K  3.3* 3.7 3.5 3.4* 3.5  CL 107 106 105 106 104  CO2 26 26 26 26 25   GLUCOSE 85 85 83 86 86  BUN <5* <5* <5* 6 5*  CREATININE <0.30* <0.30* <0.30* <0.30* <0.30*  CALCIUM 6.6* 6.9* 6.9* 7.0* 6.6*  MG 2.0 1.9 2.0 1.9 2.0  PHOS 2.7 2.6 2.4* 3.3 2.4*   GFR: CrCl cannot be calculated (This lab value cannot be used to calculate CrCl because it is not a number: <0.30). Liver Function Tests: Recent Labs  Lab 10/23/17 1819 10/24/17 0349 10/24/17 1733 10/25/17 0427  AST 18 18 19 18   ALT 14* 14* 15* 12*  ALKPHOS 92 88 91 88  BILITOT 0.6 1.0 0.8 0.9  PROT 5.1* 4.9* 5.1* 4.8*  ALBUMIN 2.5* 2.4* 2.5* 2.4*   No results for input(s): LIPASE, AMYLASE in the last 168 hours. No results for input(s): AMMONIA in the last 168 hours. Coagulation Profile: Recent Labs  Lab 10/28/17 0514  INR 1.39   Cardiac Enzymes: No results for input(s): CKTOTAL, CKMB, CKMBINDEX, TROPONINI in the last 168 hours. BNP (last 3 results) No results for input(s): PROBNP in the last 8760 hours. HbA1C: No results for input(s): HGBA1C in the last 72 hours. CBG: No results for input(s): GLUCAP in the last 168 hours. Lipid Profile: No results for input(s): CHOL, HDL, LDLCALC, TRIG, CHOLHDL, LDLDIRECT in the last 72 hours. Thyroid Function Tests: No results for input(s): TSH, T4TOTAL, FREET4, T3FREE, THYROIDAB in the last 72 hours. Anemia Panel: No results for input(s): VITAMINB12, FOLATE, FERRITIN, TIBC, IRON, RETICCTPCT in the last 72 hours. Urine analysis:    Component Value Date/Time   COLORURINE YELLOW 10/16/2017 1410   APPEARANCEUR CLOUDY (A) 10/16/2017 1410   LABSPEC 1.023 10/16/2017 1410   PHURINE 5.0 10/16/2017 1410   GLUCOSEU NEGATIVE 10/16/2017 1410   HGBUR LARGE (A) 10/16/2017 1410   BILIRUBINUR NEGATIVE 10/16/2017 1410   KETONESUR 80 (A) 10/16/2017 1410   PROTEINUR 100 (A) 10/16/2017 1410   UROBILINOGEN 1.0 07/06/2017 1029   NITRITE NEGATIVE 10/16/2017 1410   LEUKOCYTESUR LARGE (A) 10/16/2017  1410   Sepsis Labs: @LABRCNTIP (procalcitonin:4,lacticidven:4)   )No results found for this or any previous visit (from the past 240 hour(s)).    Radiology Studies: Mr Cervical Spine W Wo Contrast Result Date: 10/27/2017 1. Multiple vertebral body, posterior element, rib, sacrum, and left scapula enhancing metastasis. 2. No epidural, leptomeningeal, or cord metastasis. 3. Pathologic mild C4, mild T10, mild T12, and moderate L3 compression deformities. 4. Mild bony retropulsion of L3 vertebral body eccentric to left with mild canal and severe left L3-4 foraminal stenosis. 5. Cystic lesion in right posterior hypopharynx measuring up to 22 mm, direct visualization recommended.   Mr Thoracic Spine W Wo Contrast Result Date: 10/27/2017 1. Multiple vertebral body, posterior element, rib, sacrum, and left scapula enhancing metastasis. 2. No epidural, leptomeningeal, or cord metastasis. 3. Pathologic mild C4, mild T10, mild T12, and moderate  L3 compression deformities. 4. Mild bony retropulsion of L3 vertebral body eccentric to left with mild canal and severe left L3-4 foraminal stenosis. 5. Cystic lesion in right posterior hypopharynx measuring up to 22 mm, direct visualization recommended.   Mr Lumbar Spine W Wo Contrast Result Date: 10/27/2017 1. Multiple vertebral body, posterior element, rib, sacrum, and left scapula enhancing metastasis. 2. No epidural, leptomeningeal, or cord metastasis. 3. Pathologic mild C4, mild T10, mild T12, and moderate L3 compression deformities. 4. Mild bony retropulsion of L3 vertebral body eccentric to left with mild canal and severe left L3-4 foraminal stenosis. 5. Cystic lesion in right posterior hypopharynx measuring up to 22 mm, direct visualization recommended.    Scheduled Meds: . apixaban  5 mg Oral BID  . calcium carbonate  1,000 mg Oral TID  . feeding supplement   237 mL Oral TID BM  . magnesium oxide  400 mg Oral BID  . multivitamin  15 mL Oral Daily  .  OLANZapine zydis  5 mg Oral QHS  . oxyCODONE  10 mg Oral Q12H  . pantoprazole  40 mg Oral Q lunch  . potassium & sodiu  1 packet Oral TID AC & HS  . predniSONE  20 mg Oral Q breakfast  . sulfamethoxazole-tr  2 tablet Oral Mon Wed Fri  . Vitamin D   50,000 Units Oral Q Mon   Continuous Infusions:   LOS: 14 days    Time spent: 25 minutes  Greater than 50% of the time spent on counseling and coordinating the care.   Leisa Lenz, MD [facility] Pager [phone]  If 7PM-7AM, please contact night-coverage [email or web address] Password [facility] 10/30/2017, 3:26 PM

## 2017-10-30 NOTE — Progress Notes (Signed)
Calorie Count Note  Pt currently NPO for PEG placement. RN unaware of procedure time. Pt has not had anything to eat today. RD to monitor for tube feeding recommendations.   48 hour calorie count ordered. Day 1- 10/29/17  Diet: Regular Diet- NPO for procedure Supplements: Ensure Enlive TID  Lunch: 395 calories 5 g protein Dinner: 147 calories 3 g protein Supplements: none  Total intake: 542 kcal (25% of minimum estimated needs)  8 protein (7% of minimum estimated needs)  Nutrition Dx: Severe Malnutrition related to cancer and cancer related treatments, chronic illness as evidenced by percent weight loss, energy intake < or equal to 75% for > or equal to 1 month, severe fat depletion, moderate muscle depletion.  Goal: Patient will meet greater than or equal to 90% of their needs  Intervention:  -Continue Ensure Enlive po TID, each supplement provides 350 kcal and 20 grams of protein -Continue liquid MVI   Mariana Single RD, LDN Clinical Nutrition Pager # [phone]

## 2017-10-31 LAB — COMPREHENSIVE METABOLIC PANEL
ALBUMIN: 2.5 g/dL — AB (ref 3.5–5.0)
ALT: 19 U/L (ref 17–63)
AST: 23 U/L (ref 15–41)
Alkaline Phosphatase: 103 U/L (ref 38–126)
Anion gap: 7 (ref 5–15)
BILIRUBIN TOTAL: 0.8 mg/dL (ref 0.3–1.2)
BUN: 6 mg/dL (ref 6–20)
CHLORIDE: 103 mmol/L (ref 101–111)
CO2: 25 mmol/L (ref 22–32)
Calcium: 7.1 mg/dL — ABNORMAL LOW (ref 8.9–10.3)
GLUCOSE: 83 mg/dL (ref 65–99)
POTASSIUM: 3.6 mmol/L (ref 3.5–5.1)
Sodium: 135 mmol/L (ref 135–145)
Total Protein: 4.8 g/dL — ABNORMAL LOW (ref 6.5–8.1)

## 2017-10-31 LAB — PHOSPHORUS: PHOSPHORUS: 2.5 mg/dL (ref 2.5–4.6)

## 2017-10-31 LAB — CBC
HEMATOCRIT: 32.4 % — AB (ref 39.0–52.0)
Hemoglobin: 10.3 g/dL — ABNORMAL LOW (ref 13.0–17.0)
MCH: 26.8 pg (ref 26.0–34.0)
MCHC: 31.8 g/dL (ref 30.0–36.0)
MCV: 84.4 fL (ref 78.0–100.0)
PLATELETS: 254 10*3/uL (ref 150–400)
RBC: 3.84 MIL/uL — ABNORMAL LOW (ref 4.22–5.81)
RDW: 19.4 % — AB (ref 11.5–15.5)
WBC: 4.9 10*3/uL (ref 4.0–10.5)

## 2017-10-31 LAB — MAGNESIUM: MAGNESIUM: 2 mg/dL (ref 1.7–2.4)

## 2017-10-31 MED ORDER — CLINDAMYCIN PHOSPHATE 900 MG/50ML IV SOLN: 900.0000 mg | INTRAVENOUS | Status: DC

## 2017-10-31 NOTE — Progress Notes (Signed)
Referring Physician(s): Devine,A  Supervising Physician: Aletta Edouard  Patient Status:  [facility] - In-pt  Chief Complaint:  Malnutrition, failure to thrive  Subjective: Patient familiar to IR service from prior right chest wall mass biopsy in July of this year, Port-A-Cath placement in August of this year, right nephrostomy on 08/22/17 with subsequent exchange on 10/04/17 secondary to obstructive uropathy/stone.  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  Patient also has a history of recent PE currently on Eliquis.  He recently presented to the hospital with diminished appetite, failure to thrive, severe deconditioning, weight loss, and electrolyte imbalances.  He is able to drink and tolerate solids without dysphagia though.  Request now received from internist for percutaneous gastrostomy tube placement.  Patient is afebrile with current labs including WBC 4.9, hemoglobin 10.3, platelets 254k, creatinine 0.3. Past Medical History:  Diagnosis Date  . Cancer ([place])   . Tendonitis    Past Surgical History:  Procedure Laterality Date  . CYSTOSCOPY W/ URETERAL STENT PLACEMENT Right 08/21/2017   Procedure: CYSTOSCOPY WITH RETROGRADE PYELOGRAM, RIGHT;  Surgeon: Alexis Frock, MD;  Location: [facility]S;  Service: Urology;  Laterality: Right;  . HERNIA REPAIR  1988   Dr. Vida Rigger  . IR FLUORO GUIDE PORT INSERTION RIGHT  07/26/2017  . IR NEPHROSTOMY EXCHANGE RIGHT  10/04/2017  . IR NEPHROSTOMY PLACEMENT RIGHT  08/21/2017  . IR US GUIDE VASC ACCESS RIGHT  07/26/2017      Allergies: Bee venom  Medications: Prior to Admission medications   Medication Sig Start Date End Date Taking? Authorizing Provider  apixaban (ELIQUIS) 5 MG TABS tablet Take 2 tabs po BID x7 days, then take 1 tab po BID 10/08/17  Yes Patrecia Pour, MD  calcium elemental as carbonate (BARIATRIC TUMS ULTRA) 400 MG chewable tablet Chew 3 tablets (1,200 mg total) 3  (three) times daily by mouth. 10/08/17  Yes Patrecia Pour, MD  chlorhexidine (PERIDEX) 0.12 % solution 15 mLs by Mouth Rinse route 2 (two) times daily. 09/03/17  Yes Theodis Blaze, MD  Cholecalciferol 50000 units TABS Take 50,000 Units once a week by mouth. for 8 weeks, then return for level recheck Patient taking differently: Take 50,000 Units every Monday by mouth. for 8 weeks, then return for level recheck 10/08/17  Yes Patrecia Pour, MD  dronabinol (MARINOL) 5 MG capsule Take 1 capsule (5 mg total) daily before lunch by mouth. 10/11/17  Yes Gerlene Fee, NP  Lidocaine-Prilocaine, Bulk, 2.5-2.5 % CREA Apply to affected areas topically one time daily   Yes [provider]  Multiple Vitamin (MULTIVITAMIN WITH MINERALS) TABS tablet Take 1 tablet by mouth daily. 09/25/17  Yes Perlov, Marinell Blight, MD  OLANZapine zydis (ZYPREXA) 5 MG disintegrating tablet Take 0.5 tablets (2.5 mg total) by mouth at bedtime. 09/03/17  Yes Theodis Blaze, MD  oxyCODONE (OXYCONTIN) 10 mg 12 hr tablet Take 1 tablet (10 mg total) every 12 (twelve) hours by mouth. 10/11/17  Yes Gerlene Fee, NP  Oxycodone HCl 10 MG TABS Take 10 mg every 4 (four) hours as needed by mouth (for pain).   Yes [provider]  pantoprazole (PROTONIX) 40 MG tablet Take 40 mg by mouth daily with lunch.    Yes [provider]  polyethylene glycol (MIRALAX / GLYCOLAX) packet Take 17 g daily as needed by mouth for moderate constipation.    Yes [provider]  predniSONE (DELTASONE) 10 MG tablet  Take 2 tablets (20 mg total) daily with breakfast by mouth. 10/08/17  Yes Patrecia Pour, MD  prochlorperazine (COMPAZINE) 10 MG tablet Take 10 mg every 6 (six) hours as needed by mouth for nausea or vomiting.   Yes [provider]  senna-docusate (SENOKOT-S) 8.6-50 MG tablet Take 1 tablet by mouth daily as needed for mild constipation.   Yes [provider]  sulfamethoxazole-trimethoprim (BACTRIM,SEPTRA)  200-40 MG/5ML suspension Take 20 mLs by mouth every Monday, Wednesday, and Friday. Continuous   Yes [provider]  oxyCODONE (ROXICODONE) 5 MG immediate release tablet Take 1 tablet (5 mg total) every 4 (four) hours as needed by mouth for severe pain. Patient not taking: Reported on 10/16/2017 10/11/17   Gerlene Fee, NP     Vital Signs: BP 110/73 (BP Location: Right Arm)   Pulse 100   Temp 97.8 F (36.6 C) (Oral)   Resp 18   Ht 6\' 2"  (1.88 m)   Wt 129 lb 6.6 oz (58.7 kg)   SpO2 100%   BMI 16.62 kg/m   Physical Exam cachectic appearing white male in no acute distress.  Chest with distant BS but clear to auscultation bilaterally.  Clean, intact right chest wall Port-A-Cath.  Heart with tachycardic but regular rhythm.  Abdomen soft, positive bowel sounds, nontender.  No lower extremity edema.  Right PCN output today 200 cc.  Imaging: Mr Cervical Spine W Wo Contrast  Result Date: 10/27/2017 CLINICAL DATA:  56 y/o  M; diffuse large B-cell lymphoma. EXAM: MRI CERVICAL, THORACIC AND LUMBAR SPINE WITHOUT AND WITH CONTRAST TECHNIQUE: Multiplanar and multiecho pulse sequences of the cervical spine, to include the craniocervical junction and cervicothoracic junction, and thoracic and lumbar spine, were obtained without intravenous contrast. COMPARISON:  10/19/2017 nuclear medicine scan. 10/19/2017 CT of the chest abdomen and pelvis. CONTRAST:  None. FINDINGS: MRI CERVICAL SPINE FINDINGS Alignment: Straightening of cervical lordosis, mild focal reversal of curvature at C4 level. No listhesis. Vertebrae: Enhancing lesions in the right posterior C2 elements, C4 vertebral body, and left C3 facet likely representing metastasis. Mild pathologic fracture of C4 vertebral body with 30% loss of height. Cord: Normal signal and morphology. No cord or leptomeningeal enhancement. No epidural mass. Posterior Fossa, vertebral arteries, paraspinal tissues: Peripherally enhancing cystic lesion in the right  posterior hypopharynx anterior displacing the right aryepiglottic fold measuring 12 x 22 mm (series 31, image 20). Disc levels: C2-3: No significant disc displacement, foraminal stenosis, or canal stenosis. C3-4: No significant disc displacement, foraminal stenosis, or canal stenosis. Mild facet hypertrophy. C4-5: Small disc osteophyte complex with right greater than left uncovertebral and facet hypertrophy. Mild right foraminal stenosis. Disc contact on anterior cord with mild cord flattening. No significant canal stenosis. C5-6: Tiny central disc protrusion. No significant foraminal or canal stenosis. C6-7: No significant disc displacement, foraminal stenosis, or canal stenosis. C7-T1: No significant disc displacement, foraminal stenosis, or canal stenosis. MRI THORACIC SPINE FINDINGS Alignment:  Physiologic. Vertebrae: There several enhancing lesions within the posterior elements, T10, and right T12 vertebral body compatible with metastatic disease. Additionally there are several lesions within the visualized posterior ribs and left scapular tip. Mild loss of endplate height in the right aspect of superior T10 and T12 vertebral bodies right-sided enhancing metastasis compatible with pathologic fracture. Cord: Normal signal and morphology. No cord or leptomeningeal enhancement. No epidural mass. Paraspinal and other soft tissues: Negative. Disc levels: No significant foraminal or canal stenosis. MRI LUMBAR SPINE FINDINGS Segmentation:  Standard. Alignment:  Physiologic. Vertebrae:  Moderate L3 vertebral body pathologic compression deformity stable from prior CT of chest abdomen and pelvis. Bony retropulsion is eccentric to the left with severe narrowing of the left neural foramen and mild canal stenosis. Additional enhancing lesion within the right sacral ala and the right ilium. Conus medullaris and cauda equina: Conus extends to the L1 level. Conus and cauda equina appear normal. Paraspinal and other soft  tissues: Negative. Disc levels: Mild L4-5 disc bulge and lower lumbar facet hypertrophy. Mild bilateral L4-5 foraminal stenosis. No significant canal stenosis. IMPRESSION: 1. Multiple vertebral body, posterior element, rib, sacrum, and left scapula enhancing metastasis. 2. No epidural, leptomeningeal, or cord metastasis. 3. Pathologic mild C4, mild T10, mild T12, and moderate L3 compression deformities. 4. Mild bony retropulsion of L3 vertebral body eccentric to left with mild canal and severe left L3-4 foraminal stenosis. 5. Cystic lesion in right posterior hypopharynx measuring up to 22 mm, direct visualization recommended. Electronically Signed   By: Kristine Garbe M.D.   On: 10/27/2017 15:49   Mr Thoracic Spine W Wo Contrast  Result Date: 10/27/2017 CLINICAL DATA:  56 y/o  M; diffuse large B-cell lymphoma. EXAM: MRI CERVICAL, THORACIC AND LUMBAR SPINE WITHOUT AND WITH CONTRAST TECHNIQUE: Multiplanar and multiecho pulse sequences of the cervical spine, to include the craniocervical junction and cervicothoracic junction, and thoracic and lumbar spine, were obtained without intravenous contrast. COMPARISON:  10/19/2017 nuclear medicine scan. 10/19/2017 CT of the chest abdomen and pelvis. CONTRAST:  None. FINDINGS: MRI CERVICAL SPINE FINDINGS Alignment: Straightening of cervical lordosis, mild focal reversal of curvature at C4 level. No listhesis. Vertebrae: Enhancing lesions in the right posterior C2 elements, C4 vertebral body, and left C3 facet likely representing metastasis. Mild pathologic fracture of C4 vertebral body with 30% loss of height. Cord: Normal signal and morphology. No cord or leptomeningeal enhancement. No epidural mass. Posterior Fossa, vertebral arteries, paraspinal tissues: Peripherally enhancing cystic lesion in the right posterior hypopharynx anterior displacing the right aryepiglottic fold measuring 12 x 22 mm (series 31, image 20). Disc levels: C2-3: No significant disc  displacement, foraminal stenosis, or canal stenosis. C3-4: No significant disc displacement, foraminal stenosis, or canal stenosis. Mild facet hypertrophy. C4-5: Small disc osteophyte complex with right greater than left uncovertebral and facet hypertrophy. Mild right foraminal stenosis. Disc contact on anterior cord with mild cord flattening. No significant canal stenosis. C5-6: Tiny central disc protrusion. No significant foraminal or canal stenosis. C6-7: No significant disc displacement, foraminal stenosis, or canal stenosis. C7-T1: No significant disc displacement, foraminal stenosis, or canal stenosis. MRI THORACIC SPINE FINDINGS Alignment:  Physiologic. Vertebrae: There several enhancing lesions within the posterior elements, T10, and right T12 vertebral body compatible with metastatic disease. Additionally there are several lesions within the visualized posterior ribs and left scapular tip. Mild loss of endplate height in the right aspect of superior T10 and T12 vertebral bodies right-sided enhancing metastasis compatible with pathologic fracture. Cord: Normal signal and morphology. No cord or leptomeningeal enhancement. No epidural mass. Paraspinal and other soft tissues: Negative. Disc levels: No significant foraminal or canal stenosis. MRI LUMBAR SPINE FINDINGS Segmentation:  Standard. Alignment:  Physiologic. Vertebrae: Moderate L3 vertebral body pathologic compression deformity stable from prior CT of chest abdomen and pelvis. Bony retropulsion is eccentric to the left with severe narrowing of the left neural foramen and mild canal stenosis. Additional enhancing lesion within the right sacral ala and the right ilium. Conus medullaris and cauda equina: Conus extends to the L1 level. Conus and cauda equina appear normal.  Paraspinal and other soft tissues: Negative. Disc levels: Mild L4-5 disc bulge and lower lumbar facet hypertrophy. Mild bilateral L4-5 foraminal stenosis. No significant canal stenosis.  IMPRESSION: 1. Multiple vertebral body, posterior element, rib, sacrum, and left scapula enhancing metastasis. 2. No epidural, leptomeningeal, or cord metastasis. 3. Pathologic mild C4, mild T10, mild T12, and moderate L3 compression deformities. 4. Mild bony retropulsion of L3 vertebral body eccentric to left with mild canal and severe left L3-4 foraminal stenosis. 5. Cystic lesion in right posterior hypopharynx measuring up to 22 mm, direct visualization recommended. Electronically Signed   By: Kristine Garbe M.D.   On: 10/27/2017 15:49   Mr Lumbar Spine W Wo Contrast  Result Date: 10/27/2017 CLINICAL DATA:  56 y/o  M; diffuse large B-cell lymphoma. EXAM: MRI CERVICAL, THORACIC AND LUMBAR SPINE WITHOUT AND WITH CONTRAST TECHNIQUE: Multiplanar and multiecho pulse sequences of the cervical spine, to include the craniocervical junction and cervicothoracic junction, and thoracic and lumbar spine, were obtained without intravenous contrast. COMPARISON:  10/19/2017 nuclear medicine scan. 10/19/2017 CT of the chest abdomen and pelvis. CONTRAST:  None. FINDINGS: MRI CERVICAL SPINE FINDINGS Alignment: Straightening of cervical lordosis, mild focal reversal of curvature at C4 level. No listhesis. Vertebrae: Enhancing lesions in the right posterior C2 elements, C4 vertebral body, and left C3 facet likely representing metastasis. Mild pathologic fracture of C4 vertebral body with 30% loss of height. Cord: Normal signal and morphology. No cord or leptomeningeal enhancement. No epidural mass. Posterior Fossa, vertebral arteries, paraspinal tissues: Peripherally enhancing cystic lesion in the right posterior hypopharynx anterior displacing the right aryepiglottic fold measuring 12 x 22 mm (series 31, image 20). Disc levels: C2-3: No significant disc displacement, foraminal stenosis, or canal stenosis. C3-4: No significant disc displacement, foraminal stenosis, or canal stenosis. Mild facet hypertrophy. C4-5: Small  disc osteophyte complex with right greater than left uncovertebral and facet hypertrophy. Mild right foraminal stenosis. Disc contact on anterior cord with mild cord flattening. No significant canal stenosis. C5-6: Tiny central disc protrusion. No significant foraminal or canal stenosis. C6-7: No significant disc displacement, foraminal stenosis, or canal stenosis. C7-T1: No significant disc displacement, foraminal stenosis, or canal stenosis. MRI THORACIC SPINE FINDINGS Alignment:  Physiologic. Vertebrae: There several enhancing lesions within the posterior elements, T10, and right T12 vertebral body compatible with metastatic disease. Additionally there are several lesions within the visualized posterior ribs and left scapular tip. Mild loss of endplate height in the right aspect of superior T10 and T12 vertebral bodies right-sided enhancing metastasis compatible with pathologic fracture. Cord: Normal signal and morphology. No cord or leptomeningeal enhancement. No epidural mass. Paraspinal and other soft tissues: Negative. Disc levels: No significant foraminal or canal stenosis. MRI LUMBAR SPINE FINDINGS Segmentation:  Standard. Alignment:  Physiologic. Vertebrae: Moderate L3 vertebral body pathologic compression deformity stable from prior CT of chest abdomen and pelvis. Bony retropulsion is eccentric to the left with severe narrowing of the left neural foramen and mild canal stenosis. Additional enhancing lesion within the right sacral ala and the right ilium. Conus medullaris and cauda equina: Conus extends to the L1 level. Conus and cauda equina appear normal. Paraspinal and other soft tissues: Negative. Disc levels: Mild L4-5 disc bulge and lower lumbar facet hypertrophy. Mild bilateral L4-5 foraminal stenosis. No significant canal stenosis. IMPRESSION: 1. Multiple vertebral body, posterior element, rib, sacrum, and left scapula enhancing metastasis. 2. No epidural, leptomeningeal, or cord metastasis. 3.  Pathologic mild C4, mild T10, mild T12, and moderate L3 compression deformities. 4. Mild bony  retropulsion of L3 vertebral body eccentric to left with mild canal and severe left L3-4 foraminal stenosis. 5. Cystic lesion in right posterior hypopharynx measuring up to 22 mm, direct visualization recommended. Electronically Signed   By: Kristine Garbe M.D.   On: 10/27/2017 15:49    Labs:  CBC: Recent Labs    10/19/17 0344 10/25/17 0427 10/30/17 0443 10/31/17 0433  WBC 4.9 4.4 4.8 4.9  HGB 8.9* 9.9* 9.9* 10.3*  HCT 27.8* 31.6* 31.2* 32.4*  PLT 243 197 226 254    COAGS: Recent Labs    07/26/17 1049 08/21/17 1316 10/03/17 1914 10/16/17 1304 10/28/17 0514  INR 1.13 1.23 1.25 1.44 1.39  APTT 29  --  40* 68*  --     BMP: Recent Labs    10/28/17 0514 10/29/17 0411 10/30/17 0443 10/31/17 0433  NA 137 138 135 135  K 3.5 3.4* 3.5 3.6  CL 105 106 104 103  CO2 26 26 25 25   GLUCOSE 83 86 86 83  BUN <5* 6 5* 6  CALCIUM 6.9* 7.0* 6.6* 7.1*  CREATININE <0.30* <0.30* <0.30* <0.30*  GFRNONAA NOT CALCULATED NOT CALCULATED NOT CALCULATED NOT CALCULATED  GFRAA NOT CALCULATED NOT CALCULATED NOT CALCULATED NOT CALCULATED    LIVER FUNCTION TESTS: Recent Labs    10/24/17 0349 10/24/17 1733 10/25/17 0427 10/31/17 0433  BILITOT 1.0 0.8 0.9 0.8  AST 18 19 18 23   ALT 14* 15* 12* 19  ALKPHOS 88 91 88 103  PROT 4.9* 5.1* 4.8* 4.8*  ALBUMIN 2.4* 2.5* 2.4* 2.5*    Assessment and Plan: Pt with history of 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  He recently presented to the hospital with diminished appetite, failure to thrive, severe deconditioning, weight loss, and electrolyte imbalances.  He is able to drink and tolerate solids without dysphagia though.  Request now received from internist for percutaneous gastrostomy tube placement.  Patient is afebrile with current labs including  WBC 4.9, hemoglobin 10.3, platelets 254k, creatinine 0.3.  The latest imaging studies have been reviewed by Dr. Kathlene Cote.  Currently the patient's colon is anterior to the stomach and if colon does not displace with insufflation of stomach placement of gastrostomy tube may not be possible; details/risks of gastrostomy tube placement, including but not limited to, internal bleeding, infection, injury to adjacent structures, inability to place tube discussed with patient with his understanding and consent.  Plan at this time will be to administer 1 cup of thin barium to patient night before planned procedure on 11/30.  Depending upon follow-up imaging gastrostomy tube may or may not be placed.  Patient understands above scenario.  Patient will need to remain off Eliquis until after potential gastrostomy tube placement on 11/30.   Electronically Signed: D. Rowe Robert, PA-C 10/31/2017, 1:59 PM   I spent a total of 30 minutes at the the patient's bedside AND on the patient's hospital floor or unit, greater than 50% of which was counseling/coordinating care for possible gastrostomy tube placement    Patient ID: Cory Ibarra, male   DOB: [birth year], 56 y.o.   MRN: [id number]

## 2017-10-31 NOTE — Progress Notes (Addendum)
Nutrition Follow-up  DOCUMENTATION CODES:   Severe malnutrition in context of chronic illness, Underweight  INTERVENTION:    Ensure Enlive po TID, each supplement provides 350 kcal and 20 grams of protein  Provide liquid MVI daily  Obtain daily weights  Once PEG tube placed: Begin Osmolite 1.2 @ 20 ml/hr Increase by 10 ml Q12 hours to goal rate of 80 ml/hr (2304 kcal, 106 g protein, 1574 ml water) Pt is at high risk for refeeding. Monitor and supplement electrolytes as needed per MD descretion. Increase tube feeding rate slowly.   NUTRITION DIAGNOSIS:   Severe Malnutrition related to cancer and cancer related treatments, chronic illness as evidenced by percent weight loss, energy intake < or equal to 75% for > or equal to 1 month, severe fat depletion, moderate muscle depletion.  Ongoing  GOAL:   Patient will meet greater than or equal to 90% of their needs  Not Meeting  MONITOR:   PO intake, Supplement acceptance, Labs, Weight trends, Skin, I & O's  REASON FOR ASSESSMENT:   Consult Calorie Count  ASSESSMENT:   56 y.o. 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  Of note, patient was recently diagnosed with pulmonary embolism and has been continued on Eliquis prior to admission.  During workup, patient was also noted to be tachycardic and was subsequently referred to the emergency department for further workup.  Pt placed on calorie count 11/26. Pt made NPO 11/27 for possible PEG placement that day. Pt did not have PEG placed, d/c calorie count. Pt on full liquid diet until PEG placement. Pt had cream of chicken soup last night and tolerated well. Has not had anything since. Pt aware that he needs to continue with Ensure stating "I will try to drink them  today."  Interventional radiology consulted for PEG placement. Will monitor for completion. Tube feeding recommendation provided above. Monitor phosphorus and magnesium when increasing rate as pt is at high risk for refeeding.   Weight noted to be down 3 lb since the beginning of admission. Recommend checking daily weights to monitor trends.   Medications reviewed and include: TUMS, Mag ox, liquid MVI, phos-nak, prednisone, Vit D Labs reviewed: Creatinine <0.30 (L)   Diet Order:  Diet full liquid Room service appropriate? Yes; Fluid consistency: Thin  EDUCATION NEEDS:   Education needs have been addressed  Skin:  Skin Assessment: Skin Integrity Issues: Skin Integrity Issues:: Stage II Stage II: ischial tuberosity  Last BM:  10/26/17  Height:   Ht Readings from Last 1 Encounters:  10/16/17 6\' 2"  (1.88 m)    Weight:   Wt Readings from Last 1 Encounters:  10/27/17 129 lb 6.6 oz (58.7 kg)    Ideal Body Weight:  86.36 kg  BMI:  Body mass index is 16.62 kg/m.  Estimated Nutritional Needs:   Kcal:  2150-2350 kcal/day  Protein:  105-115 g/day  Fluid:  >2.2 L/day    Mariana Single RD, LDN Clinical Nutrition Pager # - [phone]

## 2017-10-31 NOTE — Progress Notes (Signed)
Pharmacy - brief note  IR placed miscellaneous pharmacy consult with instructions to hold eliquis until after G-tube placement, planned for 11/30  Plan:  Order for eliquis discontinued  F/u resume after procedure  Doreene Eland, PharmD, BCPS.   Pager: [phone] 10/31/2017 2:31 PM

## 2017-10-31 NOTE — Progress Notes (Signed)
   10/31/17 1500  Clinical Encounter Type  Visited With Patient  Visit Type Follow-up  Spiritual Encounters  Spiritual Needs Prayer;Emotional  Stress Factors  Patient Stress Factors Health changes   Visited with this patient a week ago.  Following up on him.  He said he was going to have procedure for a feeding tube today, but will wait until Friday.  He expressed some anxiety about that, but ready for it to be over so he can get stronger.  He wants to get better and so appreciative of his family and friends for their support.  We talked about his faith and reliance on God to give him strength.  Will follow as needed. Chaplain Katherene Ponto

## 2017-10-31 NOTE — Progress Notes (Signed)
IP PROGRESS NOTE  Subjective:  Patient still awaiting placement of PEG tube.  At this time, he is actually looking forward to the procedure to facilitate improved nutrition and resumption of chemotherapy.  Objective: Vital signs in last 24 hours: Blood pressure 94/73, pulse 90, temperature 97.8 F (36.6 C), temperature source Oral, resp. rate 20, height 6\' 2"  (1.88 m), weight 129 lb 6.6 oz (58.7 kg), SpO2 90 %.  Intake/Output from previous day: 11/27 0701 - 11/28 0700 In: 360 [P.O.:240; IV Piggyback:120] Out: 1150 [Urine:950; Drains:200]  Physical Exam:  Patient is alert, awake, oriented x3.  No acute distress. HEENT: Muscle wasting, no oral mucosal lesions. Lungs: Clear to auscultation bilaterally Cardiac: S1/S2, regular, no murmurs Abdomen: Soft, nontender nondistended.  Bowel sounds are normoactive. Extremities: No lower extremity edema Portacath/PICC-without erythema.  Right nephrostomy in place draining clear yellow urine.   Lab Results: Recent Labs    10/30/17 0443 10/31/17 0433  WBC 4.8 4.9  HGB 9.9* 10.3*  HCT 31.2* 32.4*  PLT 226 254    BMET Recent Labs    10/30/17 0443 10/31/17 0433  NA 135 135  K 3.5 3.6  CL 104 103  CO2 25 25  GLUCOSE 86 83  BUN 5* 6  CREATININE <0.30* <0.30*  CALCIUM 6.6* 7.1*    Lab Results  Component Value Date   CEA1 0.7 06/28/2017    Studies/Results: No results found.  Medications: I have reviewed the patient's current medications.  Assessment/Plan: 56 y.o. 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  Severe calorie/protein malnutrition and remarkable and persistent electrolyte abnormalities.  Overall, patient continues to require  significant amount of medical care.  He has recurrent hospitalizations for the previously described reasons.  Today, I have discussed the difficult time that we are having keeping him out of the hospital as well as the high risk of continuation of systemic therapy for his malignancy.  It is hard to withhold therapy from potentially curable cancer, and patient is insistent on continuing attempting to treat him with systemic chemotherapy.  **Lymphoma: We will reassess disease with CT of the chest/abdomen/pelvis and bone scan in lieu of obtaining PET/CT as patient remains hospitalized at this time.  If disease is responding to therapy, we do have better reason to continue attempting treatments.  If there is progressive disease, patient needs to be transitioned to supportive care at this time as he is unlikely to tolerate the second lines of chemotherapy for the lymphoma indication.  Restaging imaging with CT of the neck, chest, abdomen, pelvis and bone scan demonstrated positive response to previous therapy even with reduced intensity of the second cycle of chemo and delay in the administration.. --Current severe combined calorie/protein malnutrition precludes renegotiation of systemic chemotherapy without adequate nutrition support. At this time, it is unlikely that patient will resume adequate intake on his own. My recommendation is to proceed with placement of nasogastric or percutaneous gastric tube to provide enteral continuous nutrition or intermittent gastric nutritional supplementation. Patient is understandably apprehensive, but without at least a weeklong supply, I am unable to proceed with any further chemotherapy due to higher likelihood of further deterioration of performance status and potential increase chances of dying.  **Electrolytes/Nutrition: Severe mixed calorie/protein malnutrition with a good portion of it having to do with complete lack of  appetite and effort on the patient's behalf to  improve his nutrition.  I have discussed alternative of enteral nutrition via nasogastric or percutaneous gastric tube versus parenteral nutrition through the vein.  In the context of recurrent infections, parenteral nutrition will have significant risk of causing severe infectious complications including fungal infections and I do not advocate for total parenteral nutrition at this time.  Patient is very apprehensive about tube feeds and promises to make himself eat more at this time. --Patient agreed with PEGT placement. Placement pending --Recommend continued follow-up by nutrition services with calorie counts and we will need additional recommendations regarding tube feeds --Continue electrolyte replacement therapy by IV or by mouth as needed based on his daily labs.  **Disposition:  --At the discretion of primary treating service. --Patient is still hospitalized next week and he is receiving reliable enteral nutrition through PEG tube, will consider resuming systemic therapy on Tuesday or Wednesday.    LOS: 15 days   Ardath Sax, MD   10/31/2017, 4:40 PM

## 2017-10-31 NOTE — Progress Notes (Addendum)
Patient ID: Cory Ibarra, male   DOB: 06-Nov-1961, 56 y.o.   MRN: [id number]  PROGRESS NOTE    Cory Ibarra  IHK:[id number] DOB: Nov 17, 1961 DOA: 10/16/2017  PCP: [name], [name]   Brief Narrative:  56 year old male with recently diagnosed lymphoma, s/p 2 cycles of chemo and underwent XRT, right ureteral stone and s/p nephrostomy, recent PE and now on apixaban. He present to ED with hematuria through nephrostomy tube. Hematuria spontaneously resolved. He was however found to have multiple electrolyte derangements. In addition, his nutritional status is very poor. Palliative is assisting with goals of care.   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 - S/p XRT to lumbar spine and chest wall mass in 07/2017 - Cannot get denosumab due to persistent hypocalcemia - Manage per oncology   Bone metastases - Seen on MRI without cord metastases  - He has mild C4, T10, T12 and L3 compression deformities - Lumbar corset ordered by neurosurgery  - PT ordered, pt to participate with PT if able   PE - Patient is on apixaban   Right ureteral stone / Uropathy with nephrostomy tube / Hematuria - Last changed by IR 11/1 - Hematuria resolved - Per urology, nephrostomy and Foley needs to be changed every month if no definitive treatment planned  Severe protein calorie malnutrition - Interventional radiology consulted for feeding tube placement - Continue nutritional supplementation   Severe deconditioning / Failure to thrive in adult - Palliative care assisting with goals of care    DVT prophylaxis: On apixaban Code Status: DNR/DNI Family Communication: no family at the bedside Disposition Plan: plan for feeding tube placement    Consultants:    Urology  Palliative care  Oncology  Neurosurgery   IR for peg tube placement   Procedures:   None  Antimicrobials:   None   Subjective: No overnight events.  Objective: Vitals:   10/30/17 0554 10/30/17 1354 10/30/17 2308 10/31/17 0421  BP: 93/66 104/77 114/65 110/73  Pulse: (!) 110 (!) 110 81 100  Resp: 17 19 18 18   Temp: 97.6 F (36.4 C) 98.1 F (36.7 C) (!) 97.5 F (36.4 C) 97.8 F (36.6 C)  TempSrc: Oral Oral Oral Oral  SpO2: 99% 94% 98% 100%  Weight:      Height:        Intake/Output Summary (Last 24 hours) at 10/31/2017 0819 Last data filed at 10/31/2017 7564 Gross per 24 hour  Intake 360 ml  Output 1150 ml  Net -790 ml   Filed Weights   10/16/17 1845 10/27/17 2202  Weight: 60.3 kg (132 lb 15 oz) 58.7 kg (129 lb 6.6 oz)    Physical Exam  Constitutional: Appears to be in no distress  CVS: RRR, S1/S2 + Pulmonary: Effort and breath sounds normal, no stridor, rhonchi, wheezes, rales.  Abdominal: Soft. BS +,  no distension, tenderness, rebound or guarding.  Musculoskeletal: Normal range of motion. No edema and no tenderness.  Lymphadenopathy: No lymphadenopathy noted, cervical, inguinal. Neuro: Alert. Normal reflexes, muscle tone coordination. No cranial nerve deficit. Skin: Skin is warm and dry. No rash noted. Not diaphoretic. No erythema. No pallor.  Psychiatric: Normal mood and affect. Behavior, judgment, thought content normal.    Data Reviewed: I  have personally reviewed following labs and imaging studies  CBC: Recent Labs  Lab 10/25/17 0427 10/30/17 0443 10/31/17 0433  WBC 4.4 4.8 4.9  NEUTROABS 3.3  --   --   HGB 9.9* 9.9* 10.3*  HCT 31.6* 31.2* 32.4*  MCV 83.8 83.9 84.4  PLT 197 226 235   Basic Metabolic Panel: Recent Labs  Lab 10/27/17 0419 10/28/17 0514 10/29/17 0411 10/30/17 0443 10/31/17 0433  NA 137 137 138 135 135  K 3.7 3.5 3.4* 3.5 3.6  CL 106 105 106 104 103  CO2 26 26 26 25 25   GLUCOSE 85 83 86 86 83  BUN  <5* <5* 6 5* 6  CREATININE <0.30* <0.30* <0.30* <0.30* <0.30*  CALCIUM 6.9* 6.9* 7.0* 6.6* 7.1*  MG 1.9 2.0 1.9 2.0 2.0  PHOS 2.6 2.4* 3.3 2.4* 2.5   GFR: CrCl cannot be calculated (This lab value cannot be used to calculate CrCl because it is not a number: <0.30). Liver Function Tests: Recent Labs  Lab 10/24/17 1733 10/25/17 0427 10/31/17 0433  AST 19 18 23   ALT 15* 12* 19  ALKPHOS 91 88 103  BILITOT 0.8 0.9 0.8  PROT 5.1* 4.8* 4.8*  ALBUMIN 2.5* 2.4* 2.5*   No results for input(s): LIPASE, AMYLASE in the last 168 hours. No results for input(s): AMMONIA in the last 168 hours. Coagulation Profile: Recent Labs  Lab 10/28/17 0514  INR 1.39   Cardiac Enzymes: No results for input(s): CKTOTAL, CKMB, CKMBINDEX, TROPONINI in the last 168 hours. BNP (last 3 results) No results for input(s): PROBNP in the last 8760 hours. HbA1C: No results for input(s): HGBA1C in the last 72 hours. CBG: No results for input(s): GLUCAP in the last 168 hours. Lipid Profile: No results for input(s): CHOL, HDL, LDLCALC, TRIG, CHOLHDL, LDLDIRECT in the last 72 hours. Thyroid Function Tests: No results for input(s): TSH, T4TOTAL, FREET4, T3FREE, THYROIDAB in the last 72 hours. Anemia Panel: No results for input(s): VITAMINB12, FOLATE, FERRITIN, TIBC, IRON, RETICCTPCT in the last 72 hours. Urine analysis:    Component Value Date/Time   COLORURINE YELLOW 10/16/2017 1410   APPEARANCEUR CLOUDY (A) 10/16/2017 1410   LABSPEC 1.023 10/16/2017 1410   PHURINE 5.0 10/16/2017 1410   GLUCOSEU NEGATIVE 10/16/2017 1410   HGBUR LARGE (A) 10/16/2017 1410   BILIRUBINUR NEGATIVE 10/16/2017 1410   KETONESUR 80 (A) 10/16/2017 1410   PROTEINUR 100 (A) 10/16/2017 1410   UROBILINOGEN 1.0 07/06/2017 1029   NITRITE NEGATIVE 10/16/2017 1410   LEUKOCYTESUR LARGE (A) 10/16/2017 1410   Sepsis Labs: @LABRCNTIP (procalcitonin:4,lacticidven:4)   )No results found for this or any previous visit (from the past 240  hour(s)).    Radiology Studies: Mr Cervical Spine W Wo Contrast Result Date: 10/27/2017 1. Multiple vertebral body, posterior element, rib, sacrum, and left scapula enhancing metastasis. 2. No epidural, leptomeningeal, or cord metastasis. 3. Pathologic mild C4, mild T10, mild T12, and moderate L3 compression deformities. 4. Mild bony retropulsion of L3 vertebral body eccentric to left with mild canal and severe left L3-4 foraminal stenosis. 5. Cystic lesion in right posterior hypopharynx measuring up to 22 mm, direct visualization recommended.   Mr Thoracic Spine W Wo Contrast Result Date: 10/27/2017 1. Multiple vertebral body, posterior element, rib, sacrum, and left scapula enhancing metastasis. 2. No epidural, leptomeningeal, or cord metastasis. 3. Pathologic mild C4, mild T10, mild T12, and moderate L3 compression deformities. 4. Mild bony retropulsion of L3 vertebral body eccentric to left with mild canal and severe left L3-4 foraminal  stenosis. 5. Cystic lesion in right posterior hypopharynx measuring up to 22 mm, direct visualization recommended.   Mr Lumbar Spine W Wo Contrast Result Date: 10/27/2017 1. Multiple vertebral body, posterior element, rib, sacrum, and left scapula enhancing metastasis. 2. No epidural, leptomeningeal, or cord metastasis. 3. Pathologic mild C4, mild T10, mild T12, and moderate L3 compression deformities. 4. Mild bony retropulsion of L3 vertebral body eccentric to left with mild canal and severe left L3-4 foraminal stenosis. 5. Cystic lesion in right posterior hypopharynx measuring up to 22 mm, direct visualization recommended.    Scheduled Meds: . apixaban  5 mg Oral BID  . calcium carbonate  1,000 mg Oral TID  . feeding supplement   237 mL Oral TID BM  . magnesium oxide  400 mg Oral BID  . multivitamin  15 mL Oral Daily  . OLANZapine zydis  5 mg Oral QHS  . oxyCODONE  10 mg Oral Q12H  . pantoprazole  40 mg Oral Q lunch  . potassium & sodiu  1 packet Oral  TID AC & HS  . predniSONE  20 mg Oral Q breakfast  . sulfamethoxazole-tr  2 tablet Oral Mon Wed Fri  . Vitamin D   50,000 Units Oral Q Mon   Continuous Infusions:   LOS: 15 days    Time spent: 25 minutes  Greater than 50% of the time spent on counseling and coordinating the care.   Leisa Lenz, MD [facility] Pager [phone]  If 7PM-7AM, please contact night-coverage [email or web address] Password [facility] 10/31/2017, 8:19 AM

## 2017-10-31 NOTE — Evaluation (Signed)
Physical Therapy Evaluation Patient Details Name: Cory Ibarra MRN: [id number] DOB: [birth year] Today's Date: 10/31/2017   History of Present Illness  56 year old male with recently diagnosed lymphoma, s/p 2 cycles of chemo and underwent XRT, right ureteral stone and s/p nephrostomy 10/04/17, recent PE and now on apixaban. He present to ED with hematuria through nephrostomy tube. Hematuria spontaneously resolved. He was however found to have multiple electrolyte derangements. In addition, his nutritional status is very poor. Compression deformities at C4, T10, T12, L3.   Clinical Impression  Pt admitted with above diagnosis. Pt currently with functional limitations due to the deficits listed below (see PT Problem List).  Mod assist for supine to sit, pt dizzy sitting edge of bed, HR up to 147, BP sitting 94/73. Performed seated and supine BLE exercises. Encouraged pt to gradually raise HOB over next few days to improve tolerance of sitting. Instructed pt in independent strengthening exercises. Pt will benefit from skilled PT to increase their independence and safety with mobility to allow discharge to the venue listed below.       Follow Up Recommendations SNF;Supervision/Assistance - 24 hour    Equipment Recommendations  None recommended by PT    Recommendations for Other Services       Precautions / Restrictions Precautions Precautions: Fall Precaution Comments: dizzy with sitting Restrictions Weight Bearing Restrictions: No      Mobility  Bed Mobility Overal bed mobility: Needs Assistance Bed Mobility: Supine to Sit;Sit to Supine     Supine to sit: HOB elevated;Mod assist Sit to supine: Min assist   General bed mobility comments: assist to raise trunk, dizzy in sitting, seated BP 94/73, HR up to 147 sitting at EOB  Transfers                 General transfer comment: did not attempt due to elevated HR and dizziness in sitting  Ambulation/Gait       Gait  Pattern/deviations: Narrow base of support        Stairs            Wheelchair Mobility    Modified Rankin (Stroke Patients Only)       Balance Overall balance assessment: Needs assistance Sitting-balance support: Feet supported;Bilateral upper extremity supported Sitting balance-Leahy Scale: Poor Sitting balance - Comments: posterior lean if pt doesn't have BUE support   Standing balance support: Bilateral upper extremity supported Standing balance-Leahy Scale: Poor Standing balance comment: relies on BUE support                             Pertinent Vitals/Pain Pain Score: 4  Pain Location: low back Pain Descriptors / Indicators: Sore Pain Intervention(s): Monitored during session;RN gave pain meds during session;Limited activity within patient's tolerance    Home Living Family/patient expects to be discharged to:: Skilled nursing facility               Home Equipment: Walker - 2 wheels Additional Comments: was in rehab    Prior Function Level of Independence: Needs assistance   Gait / Transfers Assistance Needed: was walking short distances with RW at SNF prior to this admission  ADL's / Homemaking Assistance Needed: needed assist for adls        Hand Dominance        Extremity/Trunk Assessment   Upper Extremity Assessment Upper Extremity Assessment: Generalized weakness    Lower Extremity Assessment Lower Extremity Assessment: RLE deficits/detail;LLE deficits/detail RLE Deficits /  Details: knee ext -3/5 LLE Deficits / Details: knee ext -3/5       Communication   Communication: No difficulties  Cognition Arousal/Alertness: Awake/alert Behavior During Therapy: WFL for tasks assessed/performed Overall Cognitive Status: Within Functional Limits for tasks assessed                                 General Comments: pleasant and cooperative      General Comments      Exercises General Exercises - Upper  Extremity Shoulder Flexion: AROM;Seated;Both;10 reps General Exercises - Lower Extremity Ankle Circles/Pumps: AROM;Both;15 reps;Seated Long Arc Quad: AROM;Both;10 reps;Seated Hip Flexion/Marching: AROM;Both;Seated;5 reps  Quad sets x 5 both supine Gluteal sets x 5 both supine   Assessment/Plan    PT Assessment Patient needs continued PT services  PT Problem List Decreased strength;Decreased activity tolerance;Decreased mobility;Decreased knowledge of use of DME;Decreased balance;Pain       PT Treatment Interventions DME instruction;Gait training;Therapeutic exercise;Therapeutic activities;Patient/family education;Functional mobility training;Balance training    PT Goals (Current goals can be found in the Care Plan section)  Acute Rehab PT Goals Patient Stated Goal: to walk, go home, shop at antique stores PT Goal Formulation: With patient Time For Goal Achievement: 10/03/17 Potential to Achieve Goals: Fair    Frequency Min 3X/week   Barriers to discharge        Co-evaluation               AM-PAC PT "6 Clicks" Daily Activity  Outcome Measure Difficulty turning over in bed (including adjusting bedclothes, sheets and blankets)?: A Lot Difficulty moving from lying on back to sitting on the side of the bed? : Unable Difficulty sitting down on and standing up from a chair with arms (e.g., wheelchair, bedside commode, etc,.)?: Unable Help needed moving to and from a bed to chair (including a wheelchair)?: Total Help needed walking in hospital room?: Total Help needed climbing 3-5 steps with a railing? : Total 6 Click Score: 7    End of Session   Activity Tolerance: Patient limited by fatigue;Treatment limited secondary to medical complications (Comment)(elevated HR, dizziness) Patient left: with call bell/phone within reach;in bed;with family/visitor present Nurse Communication: Mobility status PT Visit Diagnosis: Muscle weakness (generalized) (M62.81);Difficulty in  walking, not elsewhere classified (R26.2);Dizziness and giddiness (R42)    Time: 9480-1655 PT Time Calculation (min) (ACUTE ONLY): 30 min   Charges:   PT Evaluation $PT Eval Low Complexity: 1 Low PT Treatments $Therapeutic Activity: 8-22 mins   PT G Codes:          Philomena Doheny 10/31/2017, 2:28 PM [phone]

## 2017-11-01 LAB — PHOSPHORUS: Phosphorus: 2.9 mg/dL (ref 2.5–4.6)

## 2017-11-01 LAB — MAGNESIUM: Magnesium: 2.2 mg/dL (ref 1.7–2.4)

## 2017-11-01 NOTE — Progress Notes (Signed)
11/01/17  1830  Barium swallow given to patient to start drinking prior to midnight.  Poured Barium over ice for patient to drink.

## 2017-11-01 NOTE — Progress Notes (Addendum)
Patient ID: Cory Ibarra, male   DOB: 02/28/1961, 56 y.o.   MRN: [id number]  PROGRESS NOTE    Cory Ibarra  FTD:[id number] DOB: 11-27-1961 DOA: 10/16/2017  PCP: [name], [name]   Brief Narrative:  56 year old male with recently diagnosed lymphoma, s/p 2 cycles of chemo and underwent XRT, right ureteral stone and s/p nephrostomy, recent PE and now on apixaban. He present to ED with hematuria through nephrostomy tube. Hematuria spontaneously resolved. He was however found to have multiple electrolyte derangements. In addition, his nutritional status is very poor. Palliative is assisting with goals of care.   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 - S/p XRT to lumbar spine and chest wall mass in 07/2017 - Cannot get denosumab due to persistent hypocalcemia - Manage per oncology   Bone metastases - Seen on MRI without cord metastases  - He has mild C4, T10, T12 and L3 compression deformities - Lumbar corset ordered by neurosurgery  - PT if pt able to participate   PE - Apixaban on hold for FT placement Friday   Right ureteral stone / Uropathy with nephrostomy tube / Hematuria - Hematuria resolved - Per urology, nephrostomy and Foley needs to be changed every month if no definitive treatment planned - Last changed by IR 11/1  Severe protein calorie malnutrition - Plan for FT placement by IR Friday   Severe deconditioning / Failure to thrive in adult - Appreciate palliative care assisting with goals of care     DVT prophylaxis: Apixaban on hold for FT placement on Friday  Code Status: DNR/DNI Family Communication: no family at bedside Disposition Plan: FT placement Friday    Consultants:   Urology  Palliative care  Oncology  Neurosurgery   IR for peg tube  placement   Procedures:   None  Antimicrobials:   None   Subjective: No overnight events.  Objective: Vitals:   10/31/17 0421 10/31/17 1401 10/31/17 1951 11/01/17 0509  BP: 110/73 94/73 110/74 109/67  Pulse: 100 90 99 99  Resp: 18 20 14 12   Temp: 97.8 F (36.6 C) 97.8 F (36.6 C) 97.6 F (36.4 C) 97.7 F (36.5 C)  TempSrc: Oral Oral Oral Oral  SpO2: 100% 90% 100% 99%  Weight:      Height:        Intake/Output Summary (Last 24 hours) at 11/01/2017 0749 Last data filed at 11/01/2017 0509 Gross per 24 hour  Intake 120 ml  Output 450 ml  Net -330 ml   Filed Weights   10/16/17 1845 10/27/17 2202  Weight: 60.3 kg (132 lb 15 oz) 58.7 kg (129 lb 6.6 oz)    Physical Exam  Constitutional: Appears in no distress.  CVS: RRR, S1/S2 + Pulmonary: Effort and breath sounds normal, no stridor, rhonchi, wheezes, rales.  Abdominal: Soft. BS +,  no distension, tenderness, rebound or guarding.  Musculoskeletal: Normal range of motion. No edema and no tenderness.  Lymphadenopathy: No lymphadenopathy noted, cervical, inguinal. Neuro: Alert. Normal reflexes, muscle tone coordination. No cranial nerve deficit. Skin: Skin is warm and dry.  Psychiatric: Normal mood and affect. Behavior, judgment, thought content normal.    Data Reviewed: I have personally reviewed following labs and imaging studies  CBC: Recent Labs  Lab 10/30/17 0443  10/31/17 0433  WBC 4.8 4.9  HGB 9.9* 10.3*  HCT 31.2* 32.4*  MCV 83.9 84.4  PLT 226 683   Basic Metabolic Panel: Recent Labs  Lab 10/27/17 0419 10/28/17 0514 10/29/17 0411 10/30/17 0443 10/31/17 0433 11/01/17 0503  NA 137 137 138 135 135  --   K 3.7 3.5 3.4* 3.5 3.6  --   CL 106 105 106 104 103  --   CO2 26 26 26 25 25   --   GLUCOSE 85 83 86 86 83  --   BUN <5* <5* 6 5* 6  --   CREATININE <0.30* <0.30* <0.30* <0.30* <0.30*  --   CALCIUM 6.9* 6.9* 7.0* 6.6* 7.1*  --   MG 1.9 2.0 1.9 2.0 2.0 2.2  PHOS 2.6 2.4* 3.3 2.4* 2.5 2.9    GFR: CrCl cannot be calculated (This lab value cannot be used to calculate CrCl because it is not a number: <0.30). Liver Function Tests: Recent Labs  Lab 10/31/17 0433  AST 23  ALT 19  ALKPHOS 103  BILITOT 0.8  PROT 4.8*  ALBUMIN 2.5*   No results for input(s): LIPASE, AMYLASE in the last 168 hours. No results for input(s): AMMONIA in the last 168 hours. Coagulation Profile: Recent Labs  Lab 10/28/17 0514  INR 1.39   Cardiac Enzymes: No results for input(s): CKTOTAL, CKMB, CKMBINDEX, TROPONINI in the last 168 hours. BNP (last 3 results) No results for input(s): PROBNP in the last 8760 hours. HbA1C: No results for input(s): HGBA1C in the last 72 hours. CBG: No results for input(s): GLUCAP in the last 168 hours. Lipid Profile: No results for input(s): CHOL, HDL, LDLCALC, TRIG, CHOLHDL, LDLDIRECT in the last 72 hours. Thyroid Function Tests: No results for input(s): TSH, T4TOTAL, FREET4, T3FREE, THYROIDAB in the last 72 hours. Anemia Panel: No results for input(s): VITAMINB12, FOLATE, FERRITIN, TIBC, IRON, RETICCTPCT in the last 72 hours. Urine analysis:    Component Value Date/Time   COLORURINE YELLOW 10/16/2017 1410   APPEARANCEUR CLOUDY (A) 10/16/2017 1410   LABSPEC 1.023 10/16/2017 1410   PHURINE 5.0 10/16/2017 1410   GLUCOSEU NEGATIVE 10/16/2017 1410   HGBUR LARGE (A) 10/16/2017 1410   BILIRUBINUR NEGATIVE 10/16/2017 1410   KETONESUR 80 (A) 10/16/2017 1410   PROTEINUR 100 (A) 10/16/2017 1410   UROBILINOGEN 1.0 07/06/2017 1029   NITRITE NEGATIVE 10/16/2017 1410   LEUKOCYTESUR LARGE (A) 10/16/2017 1410   Sepsis Labs: @LABRCNTIP (procalcitonin:4,lacticidven:4)   )No results found for this or any previous visit (from the past 240 hour(s)).    Radiology Studies: Mr Cervical Spine W Wo Contrast Result Date: 10/27/2017 1. Multiple vertebral body, posterior element, rib, sacrum, and left scapula enhancing metastasis. 2. No epidural, leptomeningeal, or cord  metastasis. 3. Pathologic mild C4, mild T10, mild T12, and moderate L3 compression deformities. 4. Mild bony retropulsion of L3 vertebral body eccentric to left with mild canal and severe left L3-4 foraminal stenosis. 5. Cystic lesion in right posterior hypopharynx measuring up to 22 mm, direct visualization recommended.   Mr Thoracic Spine W Wo Contrast Result Date: 10/27/2017 1. Multiple vertebral body, posterior element, rib, sacrum, and left scapula enhancing metastasis. 2. No epidural, leptomeningeal, or cord metastasis. 3. Pathologic mild C4, mild T10, mild T12, and moderate L3 compression deformities. 4. Mild bony retropulsion of L3 vertebral body eccentric to left with mild canal and severe left L3-4 foraminal stenosis. 5. Cystic lesion in right posterior hypopharynx measuring up to 22 mm, direct visualization recommended.   Mr Lumbar  Spine W Wo Contrast Result Date: 10/27/2017 1. Multiple vertebral body, posterior element, rib, sacrum, and left scapula enhancing metastasis. 2. No epidural, leptomeningeal, or cord metastasis. 3. Pathologic mild C4, mild T10, mild T12, and moderate L3 compression deformities. 4. Mild bony retropulsion of L3 vertebral body eccentric to left with mild canal and severe left L3-4 foraminal stenosis. 5. Cystic lesion in right posterior hypopharynx measuring up to 22 mm, direct visualization recommended.    Scheduled Meds: . apixaban  5 mg Oral BID  . calcium carbonate  1,000 mg Oral TID  . feeding supplement   237 mL Oral TID BM  . magnesium oxide  400 mg Oral BID  . multivitamin  15 mL Oral Daily  . OLANZapine zydis  5 mg Oral QHS  . oxyCODONE  10 mg Oral Q12H  . pantoprazole  40 mg Oral Q lunch  . potassium & sodiu  1 packet Oral TID AC & HS  . predniSONE  20 mg Oral Q breakfast  . sulfamethoxazole-tr  2 tablet Oral Mon Wed Fri  . Vitamin D   50,000 Units Oral Q Mon   Continuous Infusions: . [START ON 11/02/2017] clindamycin (CLEOCIN) IV       LOS:  16 days    Time spent: 25 minutes  Greater than 50% of the time spent on counseling and coordinating the care.   Leisa Lenz, MD [facility] Pager [phone]  If 7PM-7AM, please contact night-coverage [email or web address] Password [facility] 11/01/2017, 7:49 AM

## 2017-11-02 ENCOUNTER — Encounter (HOSPITAL_COMMUNITY): Payer: Self-pay | Admitting: Interventional Radiology

## 2017-11-02 ENCOUNTER — Inpatient Hospital Stay (HOSPITAL_COMMUNITY): Payer: Medicaid Other

## 2017-11-02 HISTORY — PX: IR GASTROSTOMY TUBE MOD SED: IMG625

## 2017-11-02 LAB — CBC WITH DIFFERENTIAL/PLATELET
Basophils Absolute: 0 10*3/uL (ref 0.0–0.1)
Basophils Relative: 0 %
EOS ABS: 0.1 10*3/uL (ref 0.0–0.7)
EOS PCT: 2 %
HCT: 30.1 % — ABNORMAL LOW (ref 39.0–52.0)
Hemoglobin: 9.6 g/dL — ABNORMAL LOW (ref 13.0–17.0)
LYMPHS ABS: 0.7 10*3/uL (ref 0.7–4.0)
LYMPHS PCT: 14 %
MCH: 27 pg (ref 26.0–34.0)
MCHC: 31.9 g/dL (ref 30.0–36.0)
MCV: 84.8 fL (ref 78.0–100.0)
MONO ABS: 0.3 10*3/uL (ref 0.1–1.0)
Monocytes Relative: 6 %
Neutro Abs: 4 10*3/uL (ref 1.7–7.7)
Neutrophils Relative %: 78 %
PLATELETS: 257 10*3/uL (ref 150–400)
RBC: 3.55 MIL/uL — ABNORMAL LOW (ref 4.22–5.81)
RDW: 19 % — AB (ref 11.5–15.5)
WBC: 5.1 10*3/uL (ref 4.0–10.5)

## 2017-11-02 LAB — BASIC METABOLIC PANEL
Anion gap: 6 (ref 5–15)
BUN: 5 mg/dL — AB (ref 6–20)
CHLORIDE: 102 mmol/L (ref 101–111)
CO2: 27 mmol/L (ref 22–32)
Calcium: 7.2 mg/dL — ABNORMAL LOW (ref 8.9–10.3)
GLUCOSE: 89 mg/dL (ref 65–99)
Potassium: 3.6 mmol/L (ref 3.5–5.1)
Sodium: 135 mmol/L (ref 135–145)

## 2017-11-02 LAB — PROTIME-INR
INR: 1.12
PROTHROMBIN TIME: 14.4 s (ref 11.4–15.2)

## 2017-11-02 LAB — PHOSPHORUS: Phosphorus: 2.9 mg/dL (ref 2.5–4.6)

## 2017-11-02 LAB — MAGNESIUM: Magnesium: 2 mg/dL (ref 1.7–2.4)

## 2017-11-02 MED ORDER — CEFAZOLIN SODIUM-DEXTROSE 2-4 GM/100ML-% IV SOLN
2.0000 g | INTRAVENOUS | Status: AC
  Administered 2017-11-02: 2 g via INTRAVENOUS

## 2017-11-02 MED ORDER — IOPAMIDOL (ISOVUE-300) INJECTION 61%
10.0000 mL | Freq: Once | INTRAVENOUS | Status: AC | PRN
  Administered 2017-11-02: 10 mL

## 2017-11-02 MED ORDER — GLUCAGON HCL RDNA (DIAGNOSTIC) 1 MG IJ SOLR
INTRAMUSCULAR | Status: AC
  Filled 2017-11-02: qty 1

## 2017-11-02 MED ORDER — FENTANYL CITRATE (PF) 100 MCG/2ML IJ SOLN
INTRAMUSCULAR | Status: AC
  Filled 2017-11-02: qty 2

## 2017-11-02 MED ORDER — MIDAZOLAM HCL 2 MG/2ML IJ SOLN
INTRAMUSCULAR | Status: AC
  Filled 2017-11-02: qty 2

## 2017-11-02 MED ORDER — MIDAZOLAM HCL 2 MG/2ML IJ SOLN
INTRAMUSCULAR | Status: AC | PRN
  Administered 2017-11-02: 0.5 mg via INTRAVENOUS
  Administered 2017-11-02: 1 mg via INTRAVENOUS
  Administered 2017-11-02 (×2): 0.5 mg via INTRAVENOUS

## 2017-11-02 MED ORDER — CEFAZOLIN SODIUM-DEXTROSE 2-4 GM/100ML-% IV SOLN
INTRAVENOUS | Status: AC
  Filled 2017-11-02: qty 100

## 2017-11-02 MED ORDER — LIDOCAINE HCL 1 % IJ SOLN
INTRAMUSCULAR | Status: AC | PRN
  Administered 2017-11-02: 5 mL

## 2017-11-02 MED ORDER — IOPAMIDOL (ISOVUE-300) INJECTION 61%
INTRAVENOUS | Status: AC
Start: 2017-11-02 — End: 2017-11-02
  Filled 2017-11-02: qty 50

## 2017-11-02 MED ORDER — APIXABAN 5 MG PO TABS
5.0000 mg | ORAL_TABLET | Freq: Two times a day (BID) | ORAL | Status: DC
  Administered 2017-11-02 – 2017-11-05 (×6): 5 mg via ORAL
  Filled 2017-11-02 (×6): qty 1

## 2017-11-02 MED ORDER — GLUCAGON HCL RDNA (DIAGNOSTIC) 1 MG IJ SOLR
INTRAMUSCULAR | Status: AC | PRN
  Administered 2017-11-02: .5 mg via INTRAVENOUS

## 2017-11-02 MED ORDER — FENTANYL CITRATE (PF) 100 MCG/2ML IJ SOLN
INTRAMUSCULAR | Status: AC | PRN
  Administered 2017-11-02 (×2): 25 ug via INTRAVENOUS
  Administered 2017-11-02: 50 ug via INTRAVENOUS

## 2017-11-02 MED ORDER — CEFAZOLIN (ANCEF) 1 G IV SOLR: 2.0000 g | INTRAVENOUS | Status: DC

## 2017-11-02 MED ORDER — MIDAZOLAM HCL 2 MG/2ML IJ SOLN
INTRAMUSCULAR | Status: AC
Start: 2017-11-02 — End: 2017-11-02
  Filled 2017-11-02: qty 2

## 2017-11-02 NOTE — Sedation Documentation (Signed)
Patient denies pain and is resting comfortably.  

## 2017-11-02 NOTE — Sedation Documentation (Signed)
Patient is resting comfortably. 

## 2017-11-02 NOTE — Progress Notes (Signed)
PT Cancellation Note  Patient Details Name: Cory Ibarra MRN: [id number] DOB: [birth year]   Cancelled Treatment:    Reason Eval/Treat Not Completed: Pain limiting ability to participate. Pt declined participation with PT on today.    Weston Anna, MPT Pager: [phone]

## 2017-11-02 NOTE — Progress Notes (Addendum)
Patient ID: Cory Ibarra, male   DOB: 21-Apr-1961, 56 y.o.   MRN: [id number]  PROGRESS NOTE    Cory Ibarra  FBP:[id number] DOB: 12-12-1960 DOA: 10/16/2017  PCP: Cory Jun, [name]   Brief Narrative:  56 year old male with recently diagnosed lymphoma, s/p 2 cycles of chemo and underwent XRT, right ureteral stone and s/p nephrostomy, recent PE and now on apixaban. He present to ED with hematuria through nephrostomy tube. Hematuria spontaneously resolved. He was however found to have multiple electrolyte derangements. In addition, his nutritional status is very poor. Palliative is assisting with goals of care.   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 - S/p XRT to lumbar spine and chest wall mass in 07/2017 - Cannot get denosumab due to persistent hypocalcemia - Manage per oncology   Bone metastases - Seen on MRI without cord metastases  - He has mild C4, T10, T12 and L3 compression deformities - Lumbar corset ordered by neurosurgery  - PT eval today  PE - Resume apixaban  Right ureteral stone / Uropathy with nephrostomy tube / Hematuria - Hematuria resolved - Per urology, nephrostomy and Foley needs to be changed every month if no definitive treatment planned - Last changed by IR 11/1  Severe protein calorie malnutrition - Feeding tube to be placed today   Severe deconditioning / Failure to thrive in adult - Clear liquids - Feeding tube today    DVT prophylaxis: On Apixaban Code Status: DNR/DNI Family Communication: nno family at the bedside Disposition Plan: Feeding tube placement today    Consultants:   Urology  Palliative care  Oncology  Neurosurgery   IR for peg tube placement   Procedures:   None  Antimicrobials:   None   Subjective: No  overnight events.  Objective: Vitals:   11/02/17 0915 11/02/17 0919 11/02/17 0920 11/02/17 0935  BP: 98/63  96/65 100/67  Pulse: (!) 115 (!) 116  (!) 120  Resp: 10  12 11   Temp:    98.4 F (36.9 C)  TempSrc:      SpO2: 99% 97%  100%  Weight:      Height:        Intake/Output Summary (Last 24 hours) at 11/02/2017 1001 Last data filed at 11/02/2017 0744 Gross per 24 hour  Intake 0 ml  Output 925 ml  Net -925 ml   Filed Weights   10/16/17 1845 10/27/17 2202  Weight: 60.3 kg (132 lb 15 oz) 58.7 kg (129 lb 6.6 oz)    Physical Exam  Constitutional: Appears well-developed and well-nourished. No distress.   CVS: Rate controlled, S1/S2 +, Pulmonary: Effort and breath sounds normal, no stridor, rhonchi, wheezes, rales.  Abdominal: Soft. BS +,  no distension, tenderness, rebound or guarding.  Musculoskeletal: Normal range of motion. No edema and no tenderness.  Lymphadenopathy: No lymphadenopathy noted, cervical, inguinal. Neuro: Alert. Normal reflexes, muscle tone coordination. No cranial nerve deficit. Skin: Skin is warm and dry. Marland Kitchen  Psychiatric: Normal mood and affect. Behavior, judgment, thought content normal.    Data Reviewed: I have personally reviewed following labs and imaging studies  CBC: Recent Labs  Lab 10/30/17 0443 10/31/17 0433 11/02/17 0358  WBC 4.8 4.9 5.1  NEUTROABS  --   --  4.0  HGB 9.9* 10.3* 9.6*  HCT 31.2* 32.4* 30.1*  MCV 83.9 84.4 84.8  PLT 226 254 341   Basic Metabolic Panel: Recent Labs  Lab 10/28/17 0514 10/29/17 0411 10/30/17 0443 10/31/17 0433 11/01/17 0503 11/02/17 0358  NA 137 138 135 135  --  135  K 3.5 3.4* 3.5 3.6  --  3.6  CL 105 106 104 103  --  102  CO2 26 26 25 25   --  27  GLUCOSE 83 86 86 83  --  89  BUN <5* 6 5* 6  --  5*  CREATININE <0.30* <0.30* <0.30* <0.30*  --  <0.30*  CALCIUM 6.9* 7.0* 6.6* 7.1*  --  7.2*  MG 2.0 1.9 2.0 2.0 2.2 2.0  PHOS 2.4* 3.3 2.4* 2.5 2.9 2.9   GFR: CrCl cannot be calculated (This lab  value cannot be used to calculate CrCl because it is not a number: <0.30). Liver Function Tests: Recent Labs  Lab 10/31/17 0433  AST 23  ALT 19  ALKPHOS 103  BILITOT 0.8  PROT 4.8*  ALBUMIN 2.5*   No results for input(s): LIPASE, AMYLASE in the last 168 hours. No results for input(s): AMMONIA in the last 168 hours. Coagulation Profile: Recent Labs  Lab 10/28/17 0514 11/02/17 0358  INR 1.39 1.12   Cardiac Enzymes: No results for input(s): CKTOTAL, CKMB, CKMBINDEX, TROPONINI in the last 168 hours. BNP (last 3 results) No results for input(s): PROBNP in the last 8760 hours. HbA1C: No results for input(s): HGBA1C in the last 72 hours. CBG: No results for input(s): GLUCAP in the last 168 hours. Lipid Profile: No results for input(s): CHOL, HDL, LDLCALC, TRIG, CHOLHDL, LDLDIRECT in the last 72 hours. Thyroid Function Tests: No results for input(s): TSH, T4TOTAL, FREET4, T3FREE, THYROIDAB in the last 72 hours. Anemia Panel: No results for input(s): VITAMINB12, FOLATE, FERRITIN, TIBC, IRON, RETICCTPCT in the last 72 hours. Urine analysis:    Component Value Date/Time   COLORURINE YELLOW 10/16/2017 1410   APPEARANCEUR CLOUDY (A) 10/16/2017 1410   LABSPEC 1.023 10/16/2017 1410   PHURINE 5.0 10/16/2017 1410   GLUCOSEU NEGATIVE 10/16/2017 1410   HGBUR LARGE (A) 10/16/2017 1410   BILIRUBINUR NEGATIVE 10/16/2017 1410   KETONESUR 80 (A) 10/16/2017 1410   PROTEINUR 100 (A) 10/16/2017 1410   UROBILINOGEN 1.0 07/06/2017 1029   NITRITE NEGATIVE 10/16/2017 1410   LEUKOCYTESUR LARGE (A) 10/16/2017 1410   Sepsis Labs: @LABRCNTIP (procalcitonin:4,lacticidven:4)   )No results found for this or any previous visit (from the past 240 hour(s)).    Radiology Studies: Mr Cervical Spine W Wo Contrast Result Date: 10/27/2017 1. Multiple vertebral body, posterior element, rib, sacrum, and left scapula enhancing metastasis. 2. No epidural, leptomeningeal, or cord metastasis. 3. Pathologic  mild C4, mild T10, mild T12, and moderate L3 compression deformities. 4. Mild bony retropulsion of L3 vertebral body eccentric to left with mild canal and severe left L3-4 foraminal stenosis. 5. Cystic lesion in right posterior hypopharynx measuring up to 22 mm, direct visualization recommended.   Mr Thoracic Spine W Wo Contrast Result Date: 10/27/2017 1. Multiple vertebral body, posterior element, rib, sacrum, and left scapula enhancing metastasis. 2. No epidural, leptomeningeal, or cord metastasis. 3. Pathologic mild C4, mild T10, mild T12, and moderate L3 compression deformities. 4. Mild bony retropulsion of L3 vertebral body eccentric to left with mild canal and severe left L3-4 foraminal stenosis. 5. Cystic lesion in right posterior hypopharynx measuring up to 22 mm, direct visualization recommended.   Mr Lumbar  Spine W Wo Contrast Result Date: 10/27/2017 1. Multiple vertebral body, posterior element, rib, sacrum, and left scapula enhancing metastasis. 2. No epidural, leptomeningeal, or cord metastasis. 3. Pathologic mild C4, mild T10, mild T12, and moderate L3 compression deformities. 4. Mild bony retropulsion of L3 vertebral body eccentric to left with mild canal and severe left L3-4 foraminal stenosis. 5. Cystic lesion in right posterior hypopharynx measuring up to 22 mm, direct visualization recommended.    Scheduled Meds: . apixaban  5 mg Oral BID  . calcium carbonate  1,000 mg Oral TID  . feeding supplement   237 mL Oral TID BM  . magnesium oxide  400 mg Oral BID  . multivitamin  15 mL Oral Daily  . OLANZapine zydis  5 mg Oral QHS  . oxyCODONE  10 mg Oral Q12H  . pantoprazole  40 mg Oral Q lunch  . potassium & sodiu  1 packet Oral TID AC & HS  . predniSONE  20 mg Oral Q breakfast  . sulfamethoxazole-tr  2 tablet Oral Mon Wed Fri  . Vitamin D   50,000 Units Oral Q Mon   Continuous Infusions: . clindamycin (CLEOCIN) IV       LOS: 17 days    Time spent: 15 minutes  Greater  than 50% of the time spent on counseling and coordinating the care.   Leisa Lenz, MD [facility] Pager [phone]  If 7PM-7AM, please contact night-coverage [email or web address] Password [email or web address] 11/02/2017, 10:01 AM

## 2017-11-02 NOTE — Progress Notes (Signed)
LCSW following for return to SNF.  Patient from [place].   Patient is not medically cleared.   LCSW will continue to follow.   Carolin Coy [facility] [place]

## 2017-11-02 NOTE — Progress Notes (Signed)
Date:  November 02, 2017 Chart reviewed for concurrent status and case management needs.  Will continue to follow patient progress.  Discharge Planning: following for needs  Expected discharge date: November 05, 2017  Velva Harman, BSN, [place], [place]

## 2017-11-02 NOTE — Sedation Documentation (Signed)
Sedating for OG placement

## 2017-11-02 NOTE — Procedures (Signed)
Interventional Radiology Procedure Note  Procedure: Gastrostomy tube placement  Complications: None  Estimated Blood Loss: < 10 mL  Recommendations: 20 Fr bumper retention g-tube placed with tip in body of stomach.  OK to use in AM tomorrow.  Venetia Night. Kathlene Cote, M.D Pager:  [phone]

## 2017-11-03 LAB — MAGNESIUM: Magnesium: 2.1 mg/dL (ref 1.7–2.4)

## 2017-11-03 LAB — PHOSPHORUS: Phosphorus: 2.8 mg/dL (ref 2.5–4.6)

## 2017-11-03 MED ORDER — OSMOLITE 1.2 CAL PO LIQD
1000.0000 mL | ORAL | Status: DC
  Administered 2017-11-03 – 2017-11-08 (×4): 1000 mL
  Filled 2017-11-03 (×8): qty 1000

## 2017-11-03 NOTE — Progress Notes (Signed)
Patient ID: Cory Ibarra, male   DOB: 1961/04/14, 56 y.o.   MRN: [id number]    Referring Physician(s): Leisa Lenz  Supervising Physician: Aletta Edouard  Patient Status: [facility] - In-pt  Chief Complaint: malnutrition  Subjective: Patient feels well today with no complaints.  Says tube "feels weird" but is not having any pain.  Allergies: Bee venom  Medications: Prior to Admission medications   Medication Sig Start Date End Date Taking? Authorizing Provider  apixaban (ELIQUIS) 5 MG TABS tablet Take 2 tabs po BID x7 days, then take 1 tab po BID 10/08/17  Yes Patrecia Pour, MD  calcium elemental as carbonate (BARIATRIC TUMS ULTRA) 400 MG chewable tablet Chew 3 tablets (1,200 mg total) 3 (three) times daily by mouth. 10/08/17  Yes Patrecia Pour, MD  chlorhexidine (PERIDEX) 0.12 % solution 15 mLs by Mouth Rinse route 2 (two) times daily. 09/03/17  Yes Theodis Blaze, MD  Cholecalciferol 50000 units TABS Take 50,000 Units once a week by mouth. for 8 weeks, then return for level recheck Patient taking differently: Take 50,000 Units every Monday by mouth. for 8 weeks, then return for level recheck 10/08/17  Yes Patrecia Pour, MD  dronabinol (MARINOL) 5 MG capsule Take 1 capsule (5 mg total) daily before lunch by mouth. 10/11/17  Yes Gerlene Fee, NP  Lidocaine-Prilocaine, Bulk, 2.5-2.5 % CREA Apply to affected areas topically one time daily   Yes [provider]  Multiple Vitamin (MULTIVITAMIN WITH MINERALS) TABS tablet Take 1 tablet by mouth daily. 09/25/17  Yes Perlov, Marinell Blight, MD  OLANZapine zydis (ZYPREXA) 5 MG disintegrating tablet Take 0.5 tablets (2.5 mg total) by mouth at bedtime. 09/03/17  Yes Theodis Blaze, MD  oxyCODONE (OXYCONTIN) 10 mg 12 hr tablet Take 1 tablet (10 mg total) every 12 (twelve) hours by mouth. 10/11/17  Yes Gerlene Fee, NP  Oxycodone HCl 10 MG TABS Take 10 mg every 4 (four) hours as needed by mouth (for pain).   Yes [provider]  pantoprazole  (PROTONIX) 40 MG tablet Take 40 mg by mouth daily with lunch.    Yes [provider]  polyethylene glycol (MIRALAX / GLYCOLAX) packet Take 17 g daily as needed by mouth for moderate constipation.    Yes [provider]  predniSONE (DELTASONE) 10 MG tablet Take 2 tablets (20 mg total) daily with breakfast by mouth. 10/08/17  Yes Patrecia Pour, MD  prochlorperazine (COMPAZINE) 10 MG tablet Take 10 mg every 6 (six) hours as needed by mouth for nausea or vomiting.   Yes [provider]  senna-docusate (SENOKOT-S) 8.6-50 MG tablet Take 1 tablet by mouth daily as needed for mild constipation.   Yes [provider]  sulfamethoxazole-trimethoprim (BACTRIM,SEPTRA) 200-40 MG/5ML suspension Take 20 mLs by mouth every Monday, Wednesday, and Friday. Continuous   Yes [provider]  oxyCODONE (ROXICODONE) 5 MG immediate release tablet Take 1 tablet (5 mg total) every 4 (four) hours as needed by mouth for severe pain. Patient not taking: Reported on 10/16/2017 10/11/17   Gerlene Fee, NP    Vital Signs: BP 99/63 (BP Location: Left Arm)   Pulse 93   Temp 97.8 F (36.6 C) (Oral)   Resp 18   Ht 6' 2"  (1.88 m)   Wt 129 lb 6.6 oz (58.7 kg)   SpO2 96%   BMI 16.62 kg/m   Physical Exam: Abd: soft, tube in appropriate position.  Site is c/d/i.  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 Request for percutaneous gastrostomy tube placement in order to supplement nutrition. EXAM: PERCUTANEOUS GASTROSTOMY TUBE PLACEMENT ANESTHESIA/SEDATION: 2.5 mg IV Versed; 100 mcg IV Fentanyl. Total Moderate Sedation Time 39 minutes. The patient's level of consciousness and physiologic status were continuously monitored during the procedure by Radiology nursing. CONTRAST:  6m ISOVUE-300 IOPAMIDOL (ISOVUE-300) INJECTION 61% MEDICATIONS: 2 g IV Ancef. IV antibiotic was administered in  an appropriate time interval prior to needle puncture of the skin. During the procedure the patient also received 0.5 mg IV glucagon. FLUOROSCOPY TIME:  3 minutes and 42 seconds.  16.4 mGy. PROCEDURE: The procedure, risks, benefits, and alternatives were explained to the patient. Questions regarding the procedure were encouraged and answered. The patient understands and consents to the procedure. A time-out was performed prior to initiating the procedure. The evening prior to the procedure, the patient was given thin liquid barium to ingest in order to opacify the colon. A 5-French catheter was then advanced through the the patient's mouth under fluoroscopy into the esophagus and to the level of the stomach. This catheter was used to insufflate the stomach with air under fluoroscopy. The abdominal wall was prepped with Betadine in a sterile fashion, and a sterile drape was applied covering the operative field. A sterile gown and sterile gloves were used for the procedure. Local anesthesia was provided with 1% Lidocaine. A skin incision was made in the upper abdominal wall. Under fluoroscopy, an 18 gauge trocar needle was advanced into the stomach. Contrast injection was performed to confirm intraluminal position of the needle tip. A single T tack was then deployed in the lumen of the stomach. This was brought up to tension at the skin surface. Over a guidewire, a 9-French sheath was advanced into the lumen of the stomach. The wire was left in place as a safety wire. A loop snare device from a percutaneous gastrostomy kit was then advanced into the stomach. A floppy guide wire was advanced through the orogastric catheter under fluoroscopy in the stomach. The loop snare advanced through the percutaneous gastric access was used to snare the guide wire. This allowed withdrawal of the loop snare out of the patient's mouth by retraction of the orogastric catheter and wire. A 20-French bumper retention gastrostomy tube was  looped around the snare device. It was then pulled back through the patient's mouth. The retention bumper was brought up to the anterior gastric wall. The T tack suture was cut at the skin. The exiting gastrostomy tube was cut to appropriate length and a feeding adapter applied. The catheter was injected with contrast material to confirm position and a fluoroscopic spot image saved. The tube was then flushed with saline. A dressing was applied over the gastrostomy exit site. COMPLICATIONS: None. FINDINGS: Initial fluoroscopy demonstrates adequate opacification of the colon by ingested barium in order to prevent colonic injury during the procedure. The stomach distended well with air allowing safe placement of the gastrostomy tube. After placement, the tip of the gastrostomy tube lies in the body of the stomach. IMPRESSION: Percutaneous gastrostomy with placement of a 20-French bumper retention tube in the body of the stomach. This tube can be used for percutaneous feeds beginning in 24 hours after placement. Electronically Signed   By: GAletta EdouardM.D.   On: 11/02/2017 10:04    Labs:  CBC: Recent Labs    10/25/17 0427 10/30/17 0443 10/31/17 0433 11/02/17  0358  WBC 4.4 4.8 4.9 5.1  HGB 9.9* 9.9* 10.3* 9.6*  HCT 31.6* 31.2* 32.4* 30.1*  PLT 197 226 254 257    COAGS: Recent Labs    07/26/17 1049  10/03/17 1914 10/16/17 1304 10/28/17 0514 11/02/17 0358  INR 1.13   < > 1.25 1.44 1.39 1.12  APTT 29  --  40* 68*  --   --    < > = values in this interval not displayed.    BMP: Recent Labs    10/29/17 0411 10/30/17 0443 10/31/17 0433 11/02/17 0358  NA 138 135 135 135  K 3.4* 3.5 3.6 3.6  CL 106 104 103 102  CO2 26 25 25 27   GLUCOSE 86 86 83 89  BUN 6 5* 6 5*  CALCIUM 7.0* 6.6* 7.1* 7.2*  CREATININE <0.30* <0.30* <0.30* <0.30*  GFRNONAA NOT CALCULATED NOT CALCULATED NOT CALCULATED NOT CALCULATED  GFRAA NOT CALCULATED NOT CALCULATED NOT CALCULATED NOT CALCULATED    LIVER  FUNCTION TESTS: Recent Labs    10/24/17 0349 10/24/17 1733 10/25/17 0427 10/31/17 0433  BILITOT 1.0 0.8 0.9 0.8  AST 18 19 18 23   ALT 14* 15* 12* 19  ALKPHOS 88 91 88 103  PROT 4.9* 5.1* 4.8* 4.8*  ALBUMIN 2.4* 2.5* 2.4* 2.5*    Assessment and Plan: 1. Malnutrition, s/p g-tube placement  g-tube site looks good.  The tube is already being used for tube feeds.  No further intervention planned.  Call as needed.  Electronically Signed: Henreitta Cea 11/03/2017, 2:56 PM   I spent a total of 15 Minutes at the the patient's bedside AND on the patient's hospital floor or unit, greater than 50% of which was counseling/coordinating care for malnutrition

## 2017-11-03 NOTE — Progress Notes (Addendum)
[place] for apixaban Indication: atrial fibrillation  Allergies  Allergen Reactions  . Bee Venom Swelling    Patient Measurements: Height: 6\' 2"  (188 cm) Weight: 129 lb 6.6 oz (58.7 kg) IBW/kg (Calculated) : 82.2  Vital Signs: Temp: 97.8 F (36.6 C) (12/01 0532) Temp Source: Oral (12/01 0532) BP: 99/63 (12/01 0532) Pulse Rate: 93 (12/01 0532)  Labs: Recent Labs    11/02/17 0358  HGB 9.6*  HCT 30.1*  PLT 257  LABPROT 14.4  INR 1.12  CREATININE <0.30*    CrCl cannot be calculated (This lab value cannot be used to calculate CrCl because it is not a number: <0.30).  Assessment: 42 yoM on apixaban PTA for history of afib.  Apixaban placed on hold by IR for gastrostomy tube placement 11/30 AM.  Pharmacy consulted to resume.  Per discussion with IR 11/30, apixaban was resumed per verbal order from Dr. Kathlene Cote 11/30 PM.   No labs today.  Yesterday labs shows Hgb low, relatively stable. Platelets WNL and stable. No bleeding/complications documented. Renal function low/stable.  Goal of Therapy:  Prevention of stroke and systemic embolism   Plan:  Continue apixaban 5 mg BID. Do not anticipate dose adjustments.  Pharmacy will sign off consult but will continue to monitor peripherally.  Cory Ibarra 11/03/2017,8:37 AM

## 2017-11-03 NOTE — Progress Notes (Addendum)
Patient ID: Cory Ibarra, male   DOB: 04/10/1961, 56 y.o.   MRN: [id number]  PROGRESS NOTE    Cory Ibarra  DJM:[id number] DOB: 07-24-1961 DOA: 10/16/2017  PCP: [name], [name]   Brief Narrative:   56 year old male with recently diagnosed lymphoma, s/p 2 cycles of chemo and underwent XRT, right ureteral stone and s/p nephrostomy, recent PE and now on apixaban. He present to ED with hematuria through nephrostomy tube. Hematuria spontaneously resolved. He was however found to have multiple electrolyte derangements. In addition, his nutritional status is very poor. Feeding tube placed 11/02/2017.  Assessment & Plan:   Hypocalcemia - Likely in the setting of poor nutrition  - Continue to supplement - Follow up BMP in am  Hypokalemia - Supplemented  - Follow up BMP in am  Sinus tachcyardia - Stable   Depression - Continue Zyprexa - Stable   B cell lymphoma - S/p 2 cycles of chemo - S/p XRT to lumbar spine and chest wall mass in 07/2017 - Cannot get denosumab due to persistent hypocalcemia - Manage per oncology   Bone metastases - Seen on MRI without cord metastases  - He has mild C4, T10, T12 and L3 compression deformities - Lumbar corset ordered by neurosurgery  - Awaiting PT evaluation   PE - We will resume apixaban today (was on hold for FT placement)  Right ureteral stone / Uropathy with nephrostomy tube / Hematuria - Hematuria resolved - Per urology, nephrostomy and Foley needs to be changed every month if no definitive treatment planned - Last changed 10/04/2017  Severe deconditioning / Failure to thrive in adult / Severe protein calorie malnutrition - On clear liquid - Feeding tube placed 11/30   DVT prophylaxis: on Apixaban Code Status: DNR/DNI Family Communication: no family at the bedside Disposition Plan: home once we know that the pt tolerates nutrition through feeding tube    Consultants:    Urology  PCT  Oncology  Neurosurgery  IR  Procedures:   Gastrostomy tube placed 11/30  Antimicrobials:   None     Subjective: No overnight events.   Objective: Vitals:   11/02/17 1225 11/02/17 1400 11/02/17 2106 11/03/17 0532  BP: 95/61 93/61 106/62 99/63  Pulse: (!) 115 (!) 116 99 93  Resp: 13 15 16 18   Temp: 97.7 F (36.5 C) 97.7 F (36.5 C) 97.9 F (36.6 C) 97.8 F (36.6 C)  TempSrc: Oral Oral Oral Oral  SpO2: 98% 99% 94% 96%  Weight:      Height:        Intake/Output Summary (Last 24 hours) at 11/03/2017 0821 Last data filed at 11/03/2017 0532 Gross per 24 hour  Intake 10 ml  Output 975 ml  Net -965 ml   Filed Weights   10/16/17 1845 10/27/17 2202  Weight: 60.3 kg (132 lb 15 oz) 58.7 kg (129 lb 6.6 oz)    Examination:  General exam: Appears calm and comfortable  Respiratory system: Clear to auscultation. Respiratory effort normal. Cardiovascular system: S1 & S2 heard, Rate controlled  Gastrointestinal system: Abdomen is nondistended, soft and nontender. (+) feeding tube  Central nervous system: Alert and oriented. No focal neurological deficits. Extremities: Symmetric 5 x 5 power. Skin: No rashes, lesions or ulcers Psychiatry: Judgement and insight appear normal. Mood & affect appropriate.   Data Reviewed: I have personally reviewed following labs and imaging studies  CBC: Recent Labs  Lab 10/30/17 0443 10/31/17 0433 11/02/17 0358  WBC 4.8 4.9 5.1  NEUTROABS  --   --  4.0  HGB 9.9* 10.3* 9.6*  HCT 31.2* 32.4* 30.1*  MCV 83.9 84.4 84.8  PLT 226 254 098   Basic Metabolic Panel: Recent Labs  Lab 10/28/17 0514 10/29/17 0411 10/30/17 0443 10/31/17 0433 11/01/17 0503 11/02/17 0358 11/03/17 0510  NA 137 138 135 135  --  135  --   K 3.5 3.4* 3.5 3.6  --  3.6  --   CL 105 106 104 103  --  102  --   CO2 26 26 25 25   --  27  --   GLUCOSE 83 86 86 83  --  89  --   BUN <5* 6 5* 6  --  5*  --   CREATININE <0.30* <0.30* <0.30* <0.30*   --  <0.30*  --   CALCIUM 6.9* 7.0* 6.6* 7.1*  --  7.2*  --   MG 2.0 1.9 2.0 2.0 2.2 2.0 2.1  PHOS 2.4* 3.3 2.4* 2.5 2.9 2.9 2.8   GFR: CrCl cannot be calculated (This lab value cannot be used to calculate CrCl because it is not a number: <0.30). Liver Function Tests: Recent Labs  Lab 10/31/17 0433  AST 23  ALT 19  ALKPHOS 103  BILITOT 0.8  PROT 4.8*  ALBUMIN 2.5*   No results for input(s): LIPASE, AMYLASE in the last 168 hours. No results for input(s): AMMONIA in the last 168 hours. Coagulation Profile: Recent Labs  Lab 10/28/17 0514 11/02/17 0358  INR 1.39 1.12   Cardiac Enzymes: No results for input(s): CKTOTAL, CKMB, CKMBINDEX, TROPONINI in the last 168 hours. BNP (last 3 results) No results for input(s): PROBNP in the last 8760 hours. HbA1C: No results for input(s): HGBA1C in the last 72 hours. CBG: No results for input(s): GLUCAP in the last 168 hours. Lipid Profile: No results for input(s): CHOL, HDL, LDLCALC, TRIG, CHOLHDL, LDLDIRECT in the last 72 hours. Thyroid Function Tests: No results for input(s): TSH, T4TOTAL, FREET4, T3FREE, THYROIDAB in the last 72 hours. Anemia Panel: No results for input(s): VITAMINB12, FOLATE, FERRITIN, TIBC, IRON, RETICCTPCT in the last 72 hours. Urine analysis:    Component Value Date/Time   COLORURINE YELLOW 10/16/2017 1410   APPEARANCEUR CLOUDY (A) 10/16/2017 1410   LABSPEC 1.023 10/16/2017 1410   PHURINE 5.0 10/16/2017 1410   GLUCOSEU NEGATIVE 10/16/2017 1410   HGBUR LARGE (A) 10/16/2017 1410   BILIRUBINUR NEGATIVE 10/16/2017 1410   KETONESUR 80 (A) 10/16/2017 1410   PROTEINUR 100 (A) 10/16/2017 1410   UROBILINOGEN 1.0 07/06/2017 1029   NITRITE NEGATIVE 10/16/2017 1410   LEUKOCYTESUR LARGE (A) 10/16/2017 1410   Sepsis Labs: @LABRCNTIP (procalcitonin:4,lacticidven:4)   )No results found for this or any previous visit (from the past 240 hour(s)).    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 Request for percutaneous gastrostomy tube placement in order to supplement nutrition. EXAM: PERCUTANEOUS GASTROSTOMY TUBE PLACEMENT ANESTHESIA/SEDATION: 2.5 mg IV Versed; 100 mcg IV Fentanyl. Total Moderate Sedation Time 39 minutes. The patient's level of consciousness and physiologic status were continuously monitored during the procedure by Radiology nursing. CONTRAST:  35m ISOVUE-300 IOPAMIDOL (ISOVUE-300) INJECTION 61% MEDICATIONS: 2 g IV Ancef. IV antibiotic was administered in an appropriate time interval prior to needle puncture of the skin. During the procedure the patient also received 0.5 mg IV glucagon. FLUOROSCOPY TIME:  3 minutes and 42 seconds.  16.4 mGy. PROCEDURE: The procedure, risks, benefits, and alternatives were explained to the  patient. Questions regarding the procedure were encouraged and answered. The patient understands and consents to the procedure. A time-out was performed prior to initiating the procedure. The evening prior to the procedure, the patient was given thin liquid barium to ingest in order to opacify the colon. A 5-French catheter was then advanced through the the patient's mouth under fluoroscopy into the esophagus and to the level of the stomach. This catheter was used to insufflate the stomach with air under fluoroscopy. The abdominal wall was prepped with Betadine in a sterile fashion, and a sterile drape was applied covering the operative field. A sterile gown and sterile gloves were used for the procedure. Local anesthesia was provided with 1% Lidocaine. A skin incision was made in the upper abdominal wall. Under fluoroscopy, an 18 gauge trocar needle was advanced into the stomach. Contrast injection was performed to confirm intraluminal position of the needle tip. A single T tack was then deployed in the lumen of the stomach. This was brought up to tension at the skin surface. Over a  guidewire, a 9-French sheath was advanced into the lumen of the stomach. The wire was left in place as a safety wire. A loop snare device from a percutaneous gastrostomy kit was then advanced into the stomach. A floppy guide wire was advanced through the orogastric catheter under fluoroscopy in the stomach. The loop snare advanced through the percutaneous gastric access was used to snare the guide wire. This allowed withdrawal of the loop snare out of the patient's mouth by retraction of the orogastric catheter and wire. A 20-French bumper retention gastrostomy tube was looped around the snare device. It was then pulled back through the patient's mouth. The retention bumper was brought up to the anterior gastric wall. The T tack suture was cut at the skin. The exiting gastrostomy tube was cut to appropriate length and a feeding adapter applied. The catheter was injected with contrast material to confirm position and a fluoroscopic spot image saved. The tube was then flushed with saline. A dressing was applied over the gastrostomy exit site. COMPLICATIONS: None. FINDINGS: Initial fluoroscopy demonstrates adequate opacification of the colon by ingested barium in order to prevent colonic injury during the procedure. The stomach distended well with air allowing safe placement of the gastrostomy tube. After placement, the tip of the gastrostomy tube lies in the body of the stomach. IMPRESSION: Percutaneous gastrostomy with placement of a 20-French bumper retention tube in the body of the stomach. This tube can be used for percutaneous feeds beginning in 24 hours after placement. Electronically Signed   By: Aletta Edouard M.D.   On: 11/02/2017 10:04     Scheduled Meds: . apixaban  5 mg Oral BID  . calcium carbonate  1,000 mg Oral TID  . feeding supplement (ENSURE ENLIVE)  237 mL Oral TID BM  . feeding supplement (OSMOLITE 1.2 CAL)  1,000 mL Per Tube Q24H  . magnesium oxide  400 mg Oral BID  . multivitamin  15  mL Oral Daily  . OLANZapine zydis  5 mg Oral QHS  . oxyCODONE  10 mg Oral Q12H  . pantoprazole  40 mg Oral Q lunch  . potassium & sodium phosphates  1 packet Oral TID AC & HS  . predniSONE  20 mg Oral Q breakfast  . sulfamethoxazole-trimethoprim  2 tablet Oral Once per day on Mon Wed Fri  . Vitamin D (Ergocalciferol)  50,000 Units Oral Q Mon   Continuous Infusions: . clindamycin (CLEOCIN) IV  LOS: 18 days    Time spent: 25 minutes  Greater than 50% of the time spent on counseling and coordinating the care.   Leisa Lenz, MD [facility] Pager [phone]  If 7PM-7AM, please contact night-coverage [email or web address] Password [email or web address] 11/03/2017, 8:21 AM

## 2017-11-04 DIAGNOSIS — R05 Cough: Secondary | ICD-10-CM

## 2017-11-04 LAB — BASIC METABOLIC PANEL
Anion gap: 8 (ref 5–15)
BUN: 10 mg/dL (ref 6–20)
CALCIUM: 7 mg/dL — AB (ref 8.9–10.3)
CO2: 27 mmol/L (ref 22–32)
Chloride: 99 mmol/L — ABNORMAL LOW (ref 101–111)
Glucose, Bld: 129 mg/dL — ABNORMAL HIGH (ref 65–99)
Potassium: 3.6 mmol/L (ref 3.5–5.1)
SODIUM: 134 mmol/L — AB (ref 135–145)

## 2017-11-04 LAB — MAGNESIUM: MAGNESIUM: 2 mg/dL (ref 1.7–2.4)

## 2017-11-04 LAB — PHOSPHORUS: PHOSPHORUS: 2 mg/dL — AB (ref 2.5–4.6)

## 2017-11-04 NOTE — Progress Notes (Signed)
Nutrition Follow-up  DOCUMENTATION CODES:   Severe malnutrition in context of chronic illness, Underweight  INTERVENTION:   Please consult RD for TF initiation and management. Recommend daily weights. Last recorded weight from 11/24.  Continue Osmolite 1.2 @ 30 ml/hr and increase by 10 ml every 12 hours to goal rate of 80 ml/hr. (2304 kcal, 106 g protein, 1574 ml water)  Pt is at high risk for refeeding. Monitor and supplement electrolytes as needed per MD descretion. Increase tube feeding rate slowly.   NUTRITION DIAGNOSIS:   Severe Malnutrition related to cancer and cancer related treatments, chronic illness as evidenced by percent weight loss, energy intake < or equal to 75% for > or equal to 1 month, severe fat depletion, moderate muscle depletion.  Ongoing.  GOAL:   Patient will meet greater than or equal to 90% of their needs  Progressing.  MONITOR:   PO intake, Supplement acceptance, Labs, Weight trends, Skin, I & O's  ASSESSMENT:   56 y.o. 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  Of note, patient was recently diagnosed with pulmonary embolism and has been continued on Eliquis prior to admission.  During workup, patient was also noted to be tachycardic and was subsequently referred to the emergency department for further workup.  Patient currently receiving Osmolite 1.2 @ 30 ml/hr, tolerating with no issues. Pt with no questions at this time. Recommend continued slow advancement.   Medications: Tums TID, MAG-OX tablet BID, Liquid MVI daily, Protonix tablet daily, PHOS-NAK packet QID Labs reviewed: Low Na, Phos Mg WNL  Diet Order:  Diet clear liquid Room service appropriate? Yes; Fluid consistency: Thin  EDUCATION NEEDS:   Education needs have been  addressed  Skin:  Skin Assessment: Skin Integrity Issues: Skin Integrity Issues:: Stage II Stage II: ischial tuberosity  Last BM:  10/26/17  Height:   Ht Readings from Last 1 Encounters:  10/16/17 6\' 2"  (1.88 m)    Weight:   Wt Readings from Last 1 Encounters:  10/27/17 129 lb 6.6 oz (58.7 kg)    Ideal Body Weight:  86.36 kg  BMI:  Body mass index is 16.62 kg/m.  Estimated Nutritional Needs:   Kcal:  2150-2350 kcal/day  Protein:  105-115 g/day  Fluid:  >2.2 L/day  Clayton Bibles, MS, RD, LDN [facility] Pager: [phone] After Hours Pager: [phone]

## 2017-11-04 NOTE — Progress Notes (Signed)
PROGRESS NOTE    Patient: Cory Ibarra     PCP: [name], [name]                    DOB: 1961-04-13            DOA: 10/16/2017 IRS:[id number]             DOS: 11/04/2017, 12:19 PM  Date of Service: the patient was seen and examined on 11/04/2017  --------------------------------------------------------------------------------------------------------------------------------------------------- Brief Narrative: [facility] course of stay) 47 year oldmale with recently diagnosed lymphoma, s/p 2 cycles of chemo and underwent XRT, right ureteral stone and s/p nephrostomy, recent PE and now on apixaban. He present to ED with hematuria through nephrostomy tube. Hematuria spontaneously resolved. He was however found to have multiple electrolyte derangements. In addition, his nutritional status is very poor. Feeding tube placed 11/02/2017. ---------------------------------------------------------------------------------------------------------------------------------------------------  Subjective: Patient was seen and examined this a.m.,  stable in no acute distress, no issues overnight Tube feeding was initiated yesterday he has been tolerating well plan is to increase today.  Also patient reported that he is able to increasingly tolerate p.o.   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 - S/p XRT to lumbar spine and chest wall mass in 07/2017 - Cannot get denosumab due to persistent hypocalcemia - Manage per oncology   Bone metastases - Seen on MRI without cord metastases  - He has mild C4, T10, T12 and L3 compression  deformities - Lumbar corset ordered by neurosurgery  - Awaiting PT evaluation   PE - We will resume apixaban today   Right ureteral stone / Uropathy with nephrostomy tube / Hematuria - Hematuria resolved - Per urology, nephrostomy and Foley needs to be changed every month if no definitive treatment planned - Last changed 10/04/2017  Severe deconditioning / Failure to thrive in adult / Severe protein calorie malnutrition - On clear liquid, increasingly tolerating p.o. - Feeding tube placed 11/30 - TF feeds were initiated yesterday 11/03/2017 planning to increase Tube feed rate today  DVT prophylaxis: on Apixaban Code Status: DNR/DNI Family Communication: no family at the bedside Disposition Plan: home once we know that the pt tolerates nutrition through feeding tube      Family Communication:  The above findings and plan of care has been discussed with patient and he  expressed understanding and agreement of above. (Specify name, relationship & date discussed. NO "discussed with patient")  Consultants:   Urology  PCT  Oncology  Neurosurgery  IR  Procedures:   Gastrostomy tube placed 11/30   Antimicrobials:  Anti-infectives (From admission, onward)   Start     Dose/Rate Route Frequency Ordered Stop   11/02/17 0830  ceFAZolin (ANCEF) IVPB 2g/100 mL premix     2 g 200 mL/hr over 30 Minutes Intravenous To Surgery 11/02/17 0815 11/02/17 0916   11/02/17 0820  ceFAZolin (ANCEF) 2-4 GM/100ML-% IVPB    Comments:  [name]   : cabinet override      11/02/17 0820 11/02/17 0839   11/02/17 0815  ceFAZolin (ANCEF) powder 2 g  Status:  Discontinued  2 g Other To Surgery 11/02/17 0813 11/02/17 0815   11/02/17 0600  clindamycin (CLEOCIN) IVPB 900 mg     900 mg 100 mL/hr over 30 Minutes Intravenous 30 min pre-op 10/31/17 1415     10/17/17 1000  sulfamethoxazole-trimethoprim (BACTRIM,SEPTRA) 200-40 MG/5ML suspension 20 mL  Status:  Discontinued    Comments:   Continuous     20 mL Oral Every M-W-F 10/16/17 1558 10/17/17 0936   10/17/17 1000  sulfamethoxazole-trimethoprim (BACTRIM,SEPTRA) 400-80 MG per tablet 2 tablet     2 tablet Oral Once per day on Mon Wed Fri 10/17/17 0936        Objective: Vitals:   11/03/17 0532 11/03/17 1500 11/03/17 2040 11/04/17 0537  BP: 99/63 106/61 94/60 106/63  Pulse: 93 (!) 103 88 96  Resp: 18 18 19 18   Temp: 97.8 F (36.6 C) 98.6 F (37 C) (!) 97.5 F (36.4 C) 97.9 F (36.6 C)  TempSrc: Oral Oral Oral Oral  SpO2: 96% 94% 96% 97%  Weight:      Height:        Intake/Output Summary (Last 24 hours) at 11/04/2017 1219 Last data filed at 11/04/2017 0845 Gross per 24 hour  Intake 534 ml  Output 1150 ml  Net -616 ml   Filed Weights   10/16/17 1845 10/27/17 2202  Weight: 60.3 kg (132 lb 15 oz) 58.7 kg (129 lb 6.6 oz)    Examination:  General exam: Appears calm and comfortable  Respiratory system: Clear to auscultation. Respiratory effort normal. Cardiovascular system: S1 & S2 heard, RRR. No JVD, murmurs, rubs, gallops or clicks. No pedal edema. Gastrointestinal system: Abdomen is nondistended, soft and nontender. No organomegaly or masses felt. Normal bowel sounds heard. Central nervous system: Alert and oriented. No focal neurological deficits. Extremities: Symmetric 5 x 5 power. Skin: No rashes, lesions or ulcers Psychiatry: Judgement and insight appear normal. Mood & affect appropriate.     Data Reviewed: I have personally reviewed following labs and imaging studies  CBC: Recent Labs  Lab 10/30/17 0443 10/31/17 0433 11/02/17 0358  WBC 4.8 4.9 5.1  NEUTROABS  --   --  4.0  HGB 9.9* 10.3* 9.6*  HCT 31.2* 32.4* 30.1*  MCV 83.9 84.4 84.8  PLT 226 254 102   Basic Metabolic Panel: Recent Labs  Lab 10/29/17 0411 10/30/17 0443 10/31/17 0433 11/01/17 0503 11/02/17 0358 11/03/17 0510 11/04/17 0500  NA 138 135 135  --  135  --  134*  K 3.4* 3.5 3.6  --  3.6  --  3.6  CL 106 104 103  --   102  --  99*  CO2 26 25 25   --  27  --  27  GLUCOSE 86 86 83  --  89  --  129*  BUN 6 5* 6  --  5*  --  10  CREATININE <0.30* <0.30* <0.30*  --  <0.30*  --  <0.30*  CALCIUM 7.0* 6.6* 7.1*  --  7.2*  --  7.0*  MG 1.9 2.0 2.0 2.2 2.0 2.1 2.0  PHOS 3.3 2.4* 2.5 2.9 2.9 2.8 2.0*   GFR: CrCl cannot be calculated (This lab value cannot be used to calculate CrCl because it is not a number: <0.30). Liver Function Tests: Recent Labs  Lab 10/31/17 0433  AST 23  ALT 19  ALKPHOS 103  BILITOT 0.8  PROT 4.8*  ALBUMIN 2.5*   No results for input(s): LIPASE, AMYLASE in the last 168 hours. No results for  input(s): AMMONIA in the last 168 hours. Coagulation Profile: Recent Labs  Lab 11/02/17 0358  INR 1.12   Cardiac Enzymes: No results for input(s): CKTOTAL, CKMB, CKMBINDEX, TROPONINI in the last 168 hours. BNP (last 3 results) No results for input(s): PROBNP in the last 8760 hours. HbA1C: No results for input(s): HGBA1C in the last 72 hours. CBG: No results for input(s): GLUCAP in the last 168 hours. Lipid Profile: No results for input(s): CHOL, HDL, LDLCALC, TRIG, CHOLHDL, LDLDIRECT in the last 72 hours. Thyroid Function Tests: No results for input(s): TSH, T4TOTAL, FREET4, T3FREE, THYROIDAB in the last 72 hours. Anemia Panel: No results for input(s): VITAMINB12, FOLATE, FERRITIN, TIBC, IRON, RETICCTPCT in the last 72 hours. Sepsis Labs: No results for input(s): PROCALCITON, LATICACIDVEN in the last 168 hours.  No results found for this or any previous visit (from the past 240 hour(s)).     Radiology Studies: No results found.  Scheduled Meds: . apixaban  5 mg Oral BID  . calcium carbonate  1,000 mg Oral TID  . feeding supplement (ENSURE ENLIVE)  237 mL Oral TID BM  . feeding supplement (OSMOLITE 1.2 CAL)  1,000 mL Per Tube Q24H  . magnesium oxide  400 mg Oral BID  . multivitamin  15 mL Oral Daily  . OLANZapine zydis  5 mg Oral QHS  . oxyCODONE  10 mg Oral Q12H  .  pantoprazole  40 mg Oral Q lunch  . potassium & sodium phosphates  1 packet Oral TID AC & HS  . predniSONE  20 mg Oral Q breakfast  . sulfamethoxazole-trimethoprim  2 tablet Oral Once per day on Mon Wed Fri  . Vitamin D (Ergocalciferol)  50,000 Units Oral Q Mon   Continuous Infusions: . clindamycin (CLEOCIN) IV       LOS: 19 days    Time spent: 25   Deatra James, MD [facility] Pager [phone]  If 7PM-7AM, please contact night-coverage [email or web address] Password [facility] 11/04/2017, 12:19 PM

## 2017-11-05 LAB — BASIC METABOLIC PANEL
ANION GAP: 3 — AB (ref 5–15)
BUN: 8 mg/dL (ref 6–20)
CALCIUM: 7 mg/dL — AB (ref 8.9–10.3)
CO2: 29 mmol/L (ref 22–32)
Chloride: 101 mmol/L (ref 101–111)
Creatinine, Ser: <0.3 mg/dL — ABNORMAL LOW (ref 0.61–1.24)
Glucose, Bld: 122 mg/dL — ABNORMAL HIGH (ref 65–99)
Potassium: 3.7 mmol/L (ref 3.5–5.1)
Sodium: 133 mmol/L — ABNORMAL LOW (ref 135–145)

## 2017-11-05 LAB — MAGNESIUM: MAGNESIUM: 2 mg/dL (ref 1.7–2.4)

## 2017-11-05 LAB — PHOSPHORUS: PHOSPHORUS: 2.5 mg/dL (ref 2.5–4.6)

## 2017-11-05 NOTE — Progress Notes (Addendum)
PROGRESS NOTE    Patient: Cory Ibarra     PCP: [name], [name]                    DOB: 07/10/1961            DOA: 10/16/2017 OEV:[id number]             DOS: 11/05/2017, 11:37 AM  Date of Service: the patient was seen and examined on 11/05/2017  Subjective: Patient was seen and examined this a.m., doing well, tube feed has been increased from 30-60 mL/h he has been tolerating it well since yesterday. Also trying to push himself to increase his p.o. Intake. Patient's Foley catheter was assessed was time for it to be changed, it was DC'd then since that was unable to reinsert the Foley catheter and was accompanied with some mild penile bleeding.  We have reviewed the record as of now Foley catheter would be DC'd until further evaluation. We will try to find urology team, his urologist regarding Foley recommendations   --------------------------------------------------------------------------------------------------------------------------------------------------------- Brief Narrative:  48 year oldmale with recently diagnosed lymphoma, s/p 2 cycles of chemo and underwent XRT, right ureteral stone and s/p nephrostomy, recent PE and now on apixaban. He present to ED with hematuria through nephrostomy tube. Hematuria spontaneously resolved. He was however found to have multiple electrolyte derangements. In addition, his nutritional status is very poor. Feeding tube placed 11/02/2017. 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 - S/p XRT to lumbar spine and chest wall mass in  07/2017 - Cannot get denosumab due to persistent hypocalcemia - Manage per oncology   Bone metastases - Seen on MRI without cord metastases  - He has mild C4, T10, T12 and L3 compression deformities - Lumbar corset ordered by neurosurgery  - Awaiting PT evaluation   PE - On Apixaban    Right ureteral stone / Uropathy with nephrostomy tube / Hematuria - Hematuria resolved - Per urology, nephrostomy and Foley needs to be changed every month if no definitive treatment planned - Last changed 10/04/2017 Foley catheter was DC'd today 11/05/2017, was associated some minor bleeding and unable to re-insert the Foley catheter Foley catheter will be DC'd for now until further evaluation  Severe deconditioning / Failure to thrive in adult / Severe protein calorie malnutrition - On clear liquid, increasingly tolerating p.o. - Feeding tube placed 11/30 - TF feeds were initiated yesterday 11/03/2017 planning to increase from 30-60 mL/hour, Tolerating it well   DVT prophylaxis: on Apixaban Code Status: DNR/DNI Family Communication: no family at the bedside Disposition Plan:  PT evaluation, planning home with home health versus SNF Related, needs assist with all ADLs, anticipating rehab/SNF discharging in the next 2-3 days.   Family Communication:  The above findings and plan of care has been discussed with patient and he  expressed understanding and agreement of above.  Consultants:   Urology  PCT  Oncology  Neurosurgery  IR  Procedures:   Gastrostomy tube placed 11/30   Antimicrobials:  Anti-infectives (From admission, onward)   Start     Dose/Rate Route Frequency Ordered Stop   11/02/17 0830  ceFAZolin (ANCEF) IVPB 2g/100 mL premix  2 g 200 mL/hr over 30 Minutes Intravenous To Surgery 11/02/17 0815 11/02/17 0916   11/02/17 0820  ceFAZolin (ANCEF) 2-4 GM/100ML-% IVPB    Comments:  [name]   : cabinet override      11/02/17 0820 11/02/17 0839   11/02/17 0815   ceFAZolin (ANCEF) powder 2 g  Status:  Discontinued     2 g Other To Surgery 11/02/17 0813 11/02/17 0815   11/02/17 0600  clindamycin (CLEOCIN) IVPB 900 mg     900 mg 100 mL/hr over 30 Minutes Intravenous 30 min pre-op 10/31/17 1415     10/17/17 1000  sulfamethoxazole-trimethoprim (BACTRIM,SEPTRA) 200-40 MG/5ML suspension 20 mL  Status:  Discontinued    Comments:  Continuous     20 mL Oral Every M-W-F 10/16/17 1558 10/17/17 0936   10/17/17 1000  sulfamethoxazole-trimethoprim (BACTRIM,SEPTRA) 400-80 MG per tablet 2 tablet     2 tablet Oral Once per day on Mon Wed Fri 10/17/17 0936        Objective: Vitals:   11/04/17 0537 11/04/17 1445 11/04/17 2030 11/05/17 0526  BP: 106/63 107/64 97/60 107/74  Pulse: 96 (!) 111 77 87  Resp: 18  16 18   Temp: 97.9 F (36.6 C) 98.7 F (37.1 C) 98.1 F (36.7 C) 97.7 F (36.5 C)  TempSrc: Oral Oral Oral Oral  SpO2: 97% 96% 100% 100%  Weight:      Height:        Intake/Output Summary (Last 24 hours) at 11/05/2017 1137 Last data filed at 11/05/2017 5397 Gross per 24 hour  Intake 936 ml  Output 775 ml  Net 161 ml   Filed Weights   10/16/17 1845 10/27/17 2202  Weight: 60.3 kg (132 lb 15 oz) 58.7 kg (129 lb 6.6 oz)    Examination:  General exam: Awake alert oriented in no acute distress, cachectic monitoring weight Respiratory system: Clear to auscultation bilaterally negative wheezing and crackles, positive breath sounds diffusely Cardiovascular system: S1-S2, regular rate and rhythm, negative murmurs rubs or gallops, Gastrointestinal system: Soft nontender nondistended positive bowel sounds, Central nervous system: Alert and oriented x3, if any focal neurological findings Extremities: 5/5 in upper and lower extremities, negative for edema Skin: Warm to touch, if the rash is negative any open wounds Genitourinary: Foley catheter in place, urostomy and abdomen noted Psychiatry: Judgement and insight appear normal. Mood & affect appropriate.       Data Reviewed: I have personally reviewed following labs and imaging studies  CBC: Recent Labs  Lab 10/30/17 0443 10/31/17 0433 11/02/17 0358  WBC 4.8 4.9 5.1  NEUTROABS  --   --  4.0  HGB 9.9* 10.3* 9.6*  HCT 31.2* 32.4* 30.1*  MCV 83.9 84.4 84.8  PLT 226 254 673   Basic Metabolic Panel: Recent Labs  Lab 10/30/17 0443 10/31/17 0433 11/01/17 0503 11/02/17 0358 11/03/17 0510 11/04/17 0500 11/05/17 0313  NA 135 135  --  135  --  134* 133*  K 3.5 3.6  --  3.6  --  3.6 3.7  CL 104 103  --  102  --  99* 101  CO2 25 25  --  27  --  27 29  GLUCOSE 86 83  --  89  --  129* 122*  BUN 5* 6  --  5*  --  10 8  CREATININE <0.30* <0.30*  --  <0.30*  --  <0.30* <0.30*  CALCIUM 6.6* 7.1*  --  7.2*  --  7.0* 7.0*  MG 2.0  2.0 2.2 2.0 2.1 2.0 2.0  PHOS 2.4* 2.5 2.9 2.9 2.8 2.0* 2.5   GFR: CrCl cannot be calculated (This lab value cannot be used to calculate CrCl because it is not a number: <0.30). Liver Function Tests: Recent Labs  Lab 10/31/17 0433  AST 23  ALT 19  ALKPHOS 103  BILITOT 0.8  PROT 4.8*  ALBUMIN 2.5*   No results for input(s): LIPASE, AMYLASE in the last 168 hours. No results for input(s): AMMONIA in the last 168 hours. Coagulation Profile: Recent Labs  Lab 11/02/17 0358  INR 1.12    No results found for this or any previous visit (from the past 240 hour(s)).     Radiology Studies: No results found.  Scheduled Meds: . apixaban  5 mg Oral BID  . calcium carbonate  1,000 mg Oral TID  . feeding supplement (ENSURE ENLIVE)  237 mL Oral TID BM  . feeding supplement (OSMOLITE 1.2 CAL)  1,000 mL Per Tube Q24H  . magnesium oxide  400 mg Oral BID  . multivitamin  15 mL Oral Daily  . OLANZapine zydis  5 mg Oral QHS  . oxyCODONE  10 mg Oral Q12H  . pantoprazole  40 mg Oral Q lunch  . potassium & sodium phosphates  1 packet Oral TID AC & HS  . predniSONE  20 mg Oral Q breakfast  . sulfamethoxazole-trimethoprim  2 tablet Oral Once per day on Mon Wed  Fri  . Vitamin D (Ergocalciferol)  50,000 Units Oral Q Mon   Continuous Infusions: . clindamycin (CLEOCIN) IV       LOS: 20 days    Time spent: 25   Deatra James, MD [facility] Pager [phone]  If 7PM-7AM, please contact night-coverage [email or web address] Password [facility] 11/05/2017, 11:37 AM

## 2017-11-05 NOTE — Progress Notes (Signed)
Date:  November 05, 2017 Chart reviewed for concurrent status and case management needs.  Will continue to follow patient progress.  Discharge Planning: following for needs  Expected discharge date: November 08, 2017  Elva Mauro, BSN, RN3, CCM   [phone]  

## 2017-11-05 NOTE — Progress Notes (Signed)
   11/05/17 1400  Clinical Encounter Type  Visited With Patient  Visit Type Follow-up  Spiritual Encounters  Spiritual Needs Emotional;Prayer   Responded to a request from the patient to come back today.  Patient had a procedure on Friday and seemed very pleased it was over and he can now get nutrients and hopefully get stronger.  Has a great will to get better.  Will follow as needed. Chaplain Katherene Ponto

## 2017-11-05 NOTE — Progress Notes (Signed)
Called to replace the patients chronic foley. 2 RNs had attempted prior to Probation officer and another Animator. All nurses were unsuccessful in placement of 56f foley. resistance was met and bleeding was noted. MD aware patients primary nurse updated.

## 2017-11-05 NOTE — Progress Notes (Signed)
LCSW following for return to facility.  Patient form [facility]. According to previous conversations with POA, Myrtie Hawk, the plan is for patient to return to facility.   New progress not indicates patient will return home.   LCSW will f/u with POA to clarify dc plan.   Patient not medically cleared.  LCSW will continue to follow.   Carolin Coy [place]

## 2017-11-05 NOTE — Progress Notes (Signed)
2nd RN attempted to insert foley catheter. Met resistance. Foley removed. Patient started to bleed. MD notified. Patient stable and had eliquis earlier. Charge nurse notified and advised to call 4th floor RN to attempt insertion for foley.

## 2017-11-05 NOTE — Progress Notes (Signed)
Rn called to remove; patient concerned about bleeding. Advised about regarding the eliquis. Patient requested to speak to MD. MD paged and talked to patient. Held eliquis for next 12 hours. Patient at bed resting comfortably. Will continue to monitor.

## 2017-11-05 NOTE — Progress Notes (Signed)
Rn advised to change catheter due to protocol. Previous catheter removed with no complication. Rn attempted to insert new catheter and met resistance. Catheter removed. Charge nurse notified.

## 2017-11-06 ENCOUNTER — Inpatient Hospital Stay (HOSPITAL_COMMUNITY): Payer: Medicaid Other

## 2017-11-06 DIAGNOSIS — I959 Hypotension, unspecified: Secondary | ICD-10-CM

## 2017-11-06 DIAGNOSIS — R634 Abnormal weight loss: Secondary | ICD-10-CM

## 2017-11-06 DIAGNOSIS — C8590 Non-Hodgkin lymphoma, unspecified, unspecified site: Secondary | ICD-10-CM

## 2017-11-06 DIAGNOSIS — M549 Dorsalgia, unspecified: Secondary | ICD-10-CM

## 2017-11-06 DIAGNOSIS — R000 Tachycardia, unspecified: Secondary | ICD-10-CM

## 2017-11-06 DIAGNOSIS — M899 Disorder of bone, unspecified: Secondary | ICD-10-CM

## 2017-11-06 DIAGNOSIS — J690 Pneumonitis due to inhalation of food and vomit: Secondary | ICD-10-CM

## 2017-11-06 LAB — URINALYSIS, ROUTINE W REFLEX MICROSCOPIC
BILIRUBIN URINE: NEGATIVE
Glucose, UA: NEGATIVE mg/dL
KETONES UR: NEGATIVE mg/dL
Nitrite: POSITIVE — AB
Protein, ur: 30 mg/dL — AB
SQUAMOUS EPITHELIAL / LPF: NONE SEEN
Specific Gravity, Urine: 1.011 (ref 1.005–1.030)
pH: 7 (ref 5.0–8.0)

## 2017-11-06 LAB — CBC
HEMATOCRIT: 27.3 % — AB (ref 39.0–52.0)
Hemoglobin: 8.7 g/dL — ABNORMAL LOW (ref 13.0–17.0)
MCH: 26.9 pg (ref 26.0–34.0)
MCHC: 31.9 g/dL (ref 30.0–36.0)
MCV: 84.5 fL (ref 78.0–100.0)
Platelets: 194 10*3/uL (ref 150–400)
RBC: 3.23 MIL/uL — AB (ref 4.22–5.81)
RDW: 18.5 % — AB (ref 11.5–15.5)
WBC: 16.1 10*3/uL — AB (ref 4.0–10.5)

## 2017-11-06 LAB — BASIC METABOLIC PANEL
Anion gap: 9 (ref 5–15)
BUN: 12 mg/dL (ref 6–20)
CHLORIDE: 101 mmol/L (ref 101–111)
CO2: 23 mmol/L (ref 22–32)
Calcium: 7.4 mg/dL — ABNORMAL LOW (ref 8.9–10.3)
Creatinine, Ser: 0.53 mg/dL — ABNORMAL LOW (ref 0.61–1.24)
GFR calc Af Amer: >60 mL/min (ref >60)
GFR calc non Af Amer: >60 mL/min (ref >60)
Glucose, Bld: 153 mg/dL — ABNORMAL HIGH (ref 65–99)
POTASSIUM: 3.7 mmol/L (ref 3.5–5.1)
SODIUM: 133 mmol/L — AB (ref 135–145)

## 2017-11-06 LAB — MRSA PCR SCREENING: MRSA by PCR: POSITIVE — AB

## 2017-11-06 LAB — MAGNESIUM: Magnesium: 1.8 mg/dL (ref 1.7–2.4)

## 2017-11-06 LAB — PHOSPHORUS: Phosphorus: 1.5 mg/dL — ABNORMAL LOW (ref 2.5–4.6)

## 2017-11-06 MED ORDER — METOPROLOL TARTRATE 5 MG/5ML IV SOLN: 2.5000 mg | Freq: Four times a day (QID) | INTRAVENOUS | Status: DC | PRN

## 2017-11-06 MED ORDER — SODIUM CHLORIDE 0.9 % IV BOLUS (SEPSIS)
1000.0000 mL | Freq: Once | INTRAVENOUS | Status: AC
  Administered 2017-11-06: 1000 mL via INTRAVENOUS

## 2017-11-06 MED ORDER — DIGOXIN 0.25 MG/ML IJ SOLN
0.1250 mg | Freq: Once | INTRAMUSCULAR | Status: AC
  Administered 2017-11-06: 0.125 mg via INTRAVENOUS
  Filled 2017-11-06: qty 0.5

## 2017-11-06 MED ORDER — SODIUM CHLORIDE 0.9 % IV SOLN
3.0000 g | Freq: Four times a day (QID) | INTRAVENOUS | Status: AC
  Administered 2017-11-06 – 2017-11-11 (×20): 3 g via INTRAVENOUS
  Filled 2017-11-06 (×20): qty 3

## 2017-11-06 MED ORDER — SODIUM CHLORIDE 0.9 % IV BOLUS (SEPSIS)
500.0000 mL | Freq: Once | INTRAVENOUS | Status: AC
  Administered 2017-11-06: 500 mL via INTRAVENOUS

## 2017-11-06 MED ORDER — SODIUM CHLORIDE 0.9 % IV SOLN
1.5000 g | Freq: Four times a day (QID) | INTRAVENOUS | Status: DC
  Filled 2017-11-06: qty 1.5

## 2017-11-06 MED ORDER — METOPROLOL TARTRATE 12.5 MG HALF TABLET
12.5000 mg | ORAL_TABLET | Freq: Two times a day (BID) | ORAL | Status: DC
  Administered 2017-11-06 – 2017-11-14 (×16): 12.5 mg via ORAL
  Filled 2017-11-06 (×16): qty 1

## 2017-11-06 NOTE — Progress Notes (Signed)
Nutrition Follow-up  DOCUMENTATION CODES:   Severe malnutrition in context of chronic illness, Underweight  INTERVENTION:   If tube feeding desired: Recommend changing TF formula to  Osmolite 1.5 @ 20 ml/hr increasing by 10 ml/hr q12 hours to goal rate of 60 ml/hr. 30 ml Prostat Q24, each supplement provides 100 kcals and 15 grams protein.   Pt is at high risk for refeeding. Monitor and supplement electrolytes as needed per MD descretion.   NUTRITION DIAGNOSIS:   Severe Malnutrition related to cancer and cancer related treatments, chronic illness as evidenced by percent weight loss, energy intake < or equal to 75% for > or equal to 1 month, severe fat depletion, moderate muscle depletion.  Ongoing  GOAL:   Patient will meet greater than or equal to 90% of their needs  Not meeting- TF held  MONITOR:   PO intake, Supplement acceptance, Labs, Weight trends, Skin, I & O's  REASON FOR ASSESSMENT:   Consult Calorie Count  ASSESSMENT:   56 y.o. 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  Of note, patient was recently diagnosed with pulmonary embolism and has been continued on Eliquis prior to admission.  During workup, patient was also noted to be tachycardic and was subsequently referred to the emergency department for further workup.   Spoke with nursing. RN reports pt had vomiting episode early this morning which lead to tachycardia hypoxia and hypotension resulted in new aspiration PNA. MD noted TF should be help for 24Q. Unsure if this is a volume issue. Will try to change tube feeding for less volume. See recommendations above. New rec will provide him with 100% of calorie needs and 100% of protein needs. Also, pt's HOB should remained elevated 30-45 degrees. If  episode occurs again consider adding kinetic agent or having PEJ placed. No weights have been obtained since 11/24. Would recommend checking daily weights to monitor trends.   Nurse states palliative in to see pt today or tomorrow. Will know more regarding tube feeding after goals of care meeting.   Medications reviewed and include: mag oxide, liquid MVI, potassium & sodium phosphates, prednisone, Vit D, IV abx Labs reviewed: Na 133 (L) Phosphorus 1.5 (L)  Diet Order:  Diet NPO time specified  EDUCATION NEEDS:   Education needs have been addressed  Skin:  Skin Assessment: Skin Integrity Issues: Skin Integrity Issues:: Stage II Stage II: ischial tuberosity  Last BM:  11/04/17  Height:   Ht Readings from Last 1 Encounters:  10/16/17 6\' 2"  (1.88 m)    Weight:   Wt Readings from Last 1 Encounters:  10/27/17 129 lb 6.6 oz (58.7 kg)    Ideal Body Weight:  86.36 kg  BMI:  Body mass index is 16.62 kg/m.  Estimated Nutritional Needs:   Kcal:  2150-2350 kcal/day  Protein:  105-115 g/day  Fluid:  >2.2 L/day    Cory Ibarra RD, LDN Clinical Nutrition Pager # - [phone]

## 2017-11-06 NOTE — Progress Notes (Signed)
PT Cancellation Note  Patient Details Name: Cory Ibarra MRN: [id number] DOB: 1961/01/29   Cancelled Treatment:    Reason Eval/Treat Not Completed: Medical issues which prohibited therapy , events of night noted with hypotension, increased HR. Will check back tomorrow.   Claretha Cooper 11/06/2017, 7:31 AM Tresa Endo PT [phone]

## 2017-11-06 NOTE — Progress Notes (Signed)
0540 - Rapid response & Kirby,NP arrived at bedside, EKG being obtained, 1000cc NS fluid bolus initiated

## 2017-11-06 NOTE — Progress Notes (Signed)
Rapid Response Event Note  Overview:  RRT called to room 1504 for pt having increased HR. Rate in the 150s.    Initial Focused Assessment: Pt awake and alert. States he "does not feel that his heart is racing." Pt denies pain at this time. Pt HR has been NSR to ST, rate varying from 70s-120 bpm, per Fortino Sic., RN. HR 144. BP 78/55 (63). 98% on 2LNC. Lamar Blinks, NP at bedside.   Interventions/ Event Summary: 8257 EKG: SVT rate 149 Vagal maneuver attemptedx2 unsuccessful  Carotid massage attempted x1 0545 1L NS IV bolus started, per NP 0548 EKG: ST w/ PVCs, rate 144 0550 BP 92/58, HR 146 0553 Digoxin 0.125 mcg IV given x1, per NP 0555 BP 94/56, HR 144 0600 BP 97/55, HR 143 0603 Digoxin 0.125 mcg IV given x1, per NP 0605 BP 99/58, HR 138 0607 EKG: ST w/ PVCs, rate 138 0610 BP 102/59, HR 139  Plan of Care (if not transferred): Pt is asymptomatic, HR is now ST with a rate of 130-140 bpm. 1L bolus of NS infusing. Consulted with NP who feels pt can remain on current unit at this time. RN to consult RRT if pt becomes lethargic or if pt has additional changes in vital signs.    Casimer Bilis

## 2017-11-06 NOTE — Progress Notes (Signed)
[facility] PROGRESS NOTE    Progress Note  Cory Ibarra  HYQ:[id number] DOB: Nov 13, 1961 DOA: 10/16/2017 PCP: [name], [name]     Brief Narrative:   Cory Ibarra is an 56 y.o. male past medical history recently diagnosed, with restaging CT scan of the neck chest abdomen and pelvis demonstrating positive response to chemotherapy, status post 2 cycles of therapy, underwent nephrostomy tube placement on the right due to stone, recent apixaban presents to the ED with hematuria from his nephrostomy tube to respond to resolve multiple lites imbalance and poor appetite, he was also during the workup in the ED found to be tachycardic.  His tachycardia resolved spontaneously his electrolytes were repleted and resolved. Interventional was consulted due to poor appetite.  Due to low poor appetite and ongoing weight loss interventional radiologist was consulted PEG tube was placed, and he has been progressive.  Due to significant back pain neurosurgery was consulted and CT scan of the neck was done that showed lytic lesion in C1-C4 with the progression of the pathologic fracture, neurosurgery deemed patient not a surgical candidate  Foley catheter was placed in the ED, Foley catheter, went reinserted patient started having bleeding through his urethra which is now clearing after holding Eliquis.  On 11/06/2017 large amount controlled tube feedings, 10 or 20 minutes after this he started vomiting.  Tachycardic, hypoxic and hypotensive.  Assessment/Plan:   Hypocalcemia/hypokalemia/hypophosphatemia: Electrolytes have been supplemented and monitored. Continues supplement and monitored regularly  Sinus tachycardia: Check 12-lead EKG hypotension.  Depression: Continue Zyprexa.  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 Cannot get denosumab due to persistent  hypocalcemia. MRI of the spine showed multiple lytic lesions and compression fraction of L3, no surgery was consulted and recommended no intervention as he is not a candidate. Physical therapy was consulted. Management per nephrology  History of PE: Apixaban is on hold due to significant hematuria likely due to trauma only. His Foley has been DC'd he has been urinating without any difficulties. Will hold Eliquis for an additional 24 hours I have discussed the risk and benefits of doing this with the patients.  Gross hematuria/Right urethral stone/neuropathy with Nephrostomy status ([place]): This has resolved, urology was consulted and recommended nephrostomy tube change monthly. Last change on 10/04/2017.  Severe protein caloric malnutrition/  Weight loss, unintentional: HOld tube feedings for 24 hrs.  New aspiration pneumonia: Tachycardia hypoxia and hypotension following an episode of vomiting. We have bolus 1 L of normal saline bolus and an additional 1 liter of normal saline. Recheck vitals,  Continue supplemental oxygen. Plan we will check a 12-lead EKG once heart rate is improved.  DVT prophylaxis: none, holding Eluquis for an additional 24 hrs. Family Communication:none Disposition Plan/Barrier to D/C: unable to determine Code Status:     Code Status Orders  (From admission, onward)        Start     Ordered   10/16/17 1557  Do not attempt resuscitation (DNR)  Continuous    Question Answer Comment  In the event of cardiac or respiratory ARREST Do not call a "code blue"   In the event of cardiac or respiratory ARREST Do not perform Intubation, CPR, defibrillation or ACLS   In the event of cardiac or respiratory ARREST Use medication by any route, position, wound care, and other measures to relive pain and suffering. May use oxygen, suction  and manual treatment of airway obstruction as needed for comfort.      10/16/17 1557    Code Status History    Date Active Date  Inactive Code Status Order ID Comments User Context   10/03/2017 17:34 10/08/2017 16:55 Partial Code [id number]  Florencia Reasons, MD Inpatient   09/17/2017 17:57 09/25/2017 21:21 Partial Code [id number]  Ardath Sax, MD Inpatient   08/21/2017 20:19 09/03/2017 16:46 Partial Code [id number]  Omar Person, NP Inpatient   08/21/2017 17:18 08/21/2017 20:19 Full Code [id number]  Doreatha Lew, MD Inpatient   06/28/2017 14:21 07/03/2017 19:43 Full Code [id number]  Phillips Grout, MD ED    Advance Directive Documentation     Most Recent Value  Type of Advance Directive  Healthcare Power of Attorney  Pre-existing out of facility DNR order (yellow form or pink MOST form)  No data  "MOST" Form in Place?  No data        IV Access:    Peripheral IV   Procedures and diagnostic studies:   Dg Chest Port 1 View  Result Date: 11/06/2017 CLINICAL DATA:  Cough. EXAM: PORTABLE CHEST 1 VIEW COMPARISON:  CT 10/19/2017.  Chest x-ray 10/03/2017. FINDINGS: PowerPort catheter in stable position. Mediastinum hilar structures are normal. Lungs are clear of acute infiltrates. Stable mild left base pleural-parenchymal thickening consistent scarring. No focal infiltrate. Reference is made to prior chest CT report for discussion of tiny pulmonary nodule in the left lung. No prominent pleural effusion. No pneumothorax. Stable left posterior-lateral third rib deformity. IMPRESSION: 1. PowerPort catheter in stable position. 2. No acute cardiopulmonary disease. Mild left base pleural-parenchymal scarring again noted. No mass lesion noted. Electronically Signed   By: Marcello Moores  Register   On: 11/06/2017 07:43     Medical Consultants:    None.  Anti-Infectives:   IV Unasyn  Subjective:    Cory Ibarra he relates he continues to feel awful some nausea.  He also relates that he coughed up until before the vomiting.  Objective:    Vitals:   11/06/17 0605 11/06/17 0610 11/06/17 0615 11/06/17 0830  BP: (!) 99/58  (!) 102/59 (!) 109/50 (!) (P) 81/54  Pulse: (!) 138 (!) 139 (!) 143   Resp: (!) 21 (!) 22 (!) 22   Temp:    (P) 99.4 F (37.4 C)  TempSrc:    (P) Oral  SpO2: 100% 100% 100% (P) 100%  Weight:      Height:        Intake/Output Summary (Last 24 hours) at 11/06/2017 0839 Last data filed at 11/05/2017 1902 Gross per 24 hour  Intake 174 ml  Output 600 ml  Net -426 ml   Filed Weights   10/16/17 1845 10/27/17 2202  Weight: 60.3 kg (132 lb 15 oz) 58.7 kg (129 lb 6.6 oz)    Exam: General exam: In no acute distress, appearing Respiratory system: Good air movement and clear to auscultation. Cardiovascular system: S1 & S2 heard, RRR.  Gastrointestinal system: Abdomen is nondistended, soft and nontender.  Central nervous system: Alert and oriented. No focal neurological deficits. Extremities: No pedal edema. Skin: No rashes, lesions or ulcers Psychiatry: Judgement and insight appear normal.  Hewere made decisions.   Data Reviewed:    Labs: Basic Metabolic Panel: Recent Labs  Lab 10/31/17 0433  11/02/17 0358 11/03/17 0510 11/04/17 0500 11/05/17 0313 11/06/17 0456  NA 135  --  135  --  134* 133* 133*  K 3.6  --  3.6  --  3.6 3.7 3.7  CL 103  --  102  --  99* 101 101  CO2 25  --  27  --  27 29 23   GLUCOSE 83  --  89  --  129* 122* 153*  BUN 6  --  5*  --  10 8 12   CREATININE <0.30*  --  <0.30*  --  <0.30* <0.30* 0.53*  CALCIUM 7.1*  --  7.2*  --  7.0* 7.0* 7.4*  MG 2.0   < > 2.0 2.1 2.0 2.0 1.8  PHOS 2.5   < > 2.9 2.8 2.0* 2.5 1.5*   < > = values in this interval not displayed.   GFR Estimated Creatinine Clearance: 83.6 mL/min (A) (by C-G formula based on SCr of 0.53 mg/dL (L)). Liver Function Tests: Recent Labs  Lab 10/31/17 0433  AST 23  ALT 19  ALKPHOS 103  BILITOT 0.8  PROT 4.8*  ALBUMIN 2.5*   No results for input(s): LIPASE, AMYLASE in the last 168 hours. No results for input(s): AMMONIA in the last 168 hours. Coagulation profile Recent Labs  Lab  11/02/17 0358  INR 1.12    CBC: Recent Labs  Lab 10/31/17 0433 11/02/17 0358 11/06/17 0537  WBC 4.9 5.1 16.1*  NEUTROABS  --  4.0  --   HGB 10.3* 9.6* 8.7*  HCT 32.4* 30.1* 27.3*  MCV 84.4 84.8 84.5  PLT 254 257 194   Cardiac Enzymes: No results for input(s): CKTOTAL, CKMB, CKMBINDEX, TROPONINI in the last 168 hours. BNP (last 3 results) No results for input(s): PROBNP in the last 8760 hours. CBG: No results for input(s): GLUCAP in the last 168 hours. D-Dimer: No results for input(s): DDIMER in the last 72 hours. Hgb A1c: No results for input(s): HGBA1C in the last 72 hours. Lipid Profile: No results for input(s): CHOL, HDL, LDLCALC, TRIG, CHOLHDL, LDLDIRECT in the last 72 hours. Thyroid function studies: No results for input(s): TSH, T4TOTAL, T3FREE, THYROIDAB in the last 72 hours.  Invalid input(s): FREET3 Anemia work up: No results for input(s): VITAMINB12, FOLATE, FERRITIN, TIBC, IRON, RETICCTPCT in the last 72 hours. Sepsis Labs: Recent Labs  Lab 10/31/17 0433 11/02/17 0358 11/06/17 0537  WBC 4.9 5.1 16.1*   Microbiology No results found for this or any previous visit (from the past 240 hour(s)).   Medications:   . calcium carbonate  1,000 mg Oral TID  . feeding supplement (ENSURE ENLIVE)  237 mL Oral TID BM  . feeding supplement (OSMOLITE 1.2 CAL)  1,000 mL Per Tube Q24H  . magnesium oxide  400 mg Oral BID  . multivitamin  15 mL Oral Daily  . OLANZapine zydis  5 mg Oral QHS  . oxyCODONE  10 mg Oral Q12H  . pantoprazole  40 mg Oral Q lunch  . potassium & sodium phosphates  1 packet Oral TID AC & HS  . predniSONE  20 mg Oral Q breakfast  . sulfamethoxazole-trimethoprim  2 tablet Oral Once per day on Mon Wed Fri  . Vitamin D (Ergocalciferol)  50,000 Units Oral Q Mon   Continuous Infusions:     LOS: 21 days   [facility] Pager [phone]  *Please refer to [email or web address], password [email or web address] to get updated schedule on who will  round on this patient, as hospitalists switch teams weekly. If 7PM-7AM, please contact night-coverage at [email or web address], password [email or web address] for any overnight needs.  11/06/2017, 8:39 AM

## 2017-11-06 NOTE — Progress Notes (Signed)
RN paged because pt's BP was in the 60s, O2 sat in the 80s and HR in the 140s. NP ordered bolus and went to bedside. SaO2 up to 99% with 2L O2 per [facility]. BP up to 70s after bolus began. S: pt says he doesn't feel well, but nothing in particular except cough. He denies palpitations, dizziness, chest pain or SOB. Per RN, pt vomited prior to episode and TF stopped. Also, per RN, pt has had multiple episodes of hematuria tonight.  O: Poor appearing, frail WM in NAD. Alert and oriented x 3. Pallor noted. BP-coming up. SBP up to 107 before NP left bedside. HR 140s. EKG read SVT upon arrival. Afebrile. Valsalva techniques and carotid massage attempted. EKG rhythm changed to ST with PVCs. Digoxin was given x 2 due to low BP with tachycardia. Card: tachy. Regular. S1S2 without MRG. Nephrostomy tube with clear yellow urine. A/P:  1. Hypotension and tachycardia-believed to be in setting of dehydration with ? Hematuria/low Hgb. BP improving with bolus. HR in the 130s and ST before NP left bedside. Pt has been running with ST during hospitalization. Awaiting CBC results. Blood cultures neg 10/31 and pt has no fever.  2. Hematuria-per chart, urology had weighed in on changing Foley. On 12/3, foley was d/c'd. Some episodes tonight. CBC pending. Send UA.  3. Protein calorie malnutrition with TFs. Will hold TFs til 0800 given n/v. Defer to attending to restart.  4. Cancer with bone mets s/p chemo and radiation-per oncology.  5. Cough-get CXR.    Total critical care time: 55 minutes Critical care time was exclusive of separately billable procedures and treating other patients. Critical care was necessary to treat or prevent imminent or life-threatening deterioration. Critical care was time spent personally by me on the following activities: development of treatment plan with patient and/or surrogate as well as nursing, discussions with consultants, evaluation of patient's response to treatment, examination of patient,  obtaining history from patient or surrogate, ordering and performing treatments and interventions, ordering and review of laboratory studies, ordering and review of radiographic studies, pulse oximetry and re-evaluation of patient's condition.  KJKG, NP [facility]

## 2017-11-06 NOTE — Progress Notes (Signed)
IP PROGRESS NOTE  Subjective:  Patient underwent PEG tube placement and started tube feeds, but unfortunately appears to have developed aspiration pneumonitis today after an episode of voluminous vomiting.  Objective: Vital signs in last 24 hours: Blood pressure (!) 82/52, pulse (!) 120, temperature 99.1 F (37.3 C), temperature source Oral, resp. rate 20, height 6\' 2"  (1.88 m), weight 129 lb 6.6 oz (58.7 kg), SpO2 97 %.  Intake/Output from previous day: 12/03 0701 - 12/04 0700 In: 174 [NG/GT:174] Out: 600 [Urine:275; Drains:325]  Physical Exam:  Patient is alert, awake, oriented x3.  No acute distress. HEENT: Muscle wasting, no oral mucosal lesions. Lungs: Clear to auscultation bilaterally Cardiac: S1/S2, regular, no murmurs Abdomen: Soft, nontender nondistended.    PEG tube in place Extremities: No lower extremity edema Portacath/PICC-without erythema.  Right nephrostomy in place draining clear yellow urine.   Lab Results: Recent Labs    11/06/17 0537  WBC 16.1*  HGB 8.7*  HCT 27.3*  PLT 194    BMET Recent Labs    11/05/17 0313 11/06/17 0456  NA 133* 133*  K 3.7 3.7  CL 101 101  CO2 29 23  GLUCOSE 122* 153*  BUN 8 12  CREATININE <0.30* 0.53*  CALCIUM 7.0* 7.4*    Lab Results  Component Value Date   CEA1 0.7 06/28/2017    Studies/Results: Dg Chest Port 1 View  Result Date: 11/06/2017 CLINICAL DATA:  Cough. EXAM: PORTABLE CHEST 1 VIEW COMPARISON:  CT 10/19/2017.  Chest x-ray 10/03/2017. FINDINGS: PowerPort catheter in stable position. Mediastinum hilar structures are normal. Lungs are clear of acute infiltrates. Stable mild left base pleural-parenchymal thickening consistent scarring. No focal infiltrate. Reference is made to prior chest CT report for discussion of tiny pulmonary nodule in the left lung. No prominent pleural effusion. No pneumothorax. Stable left posterior-lateral third rib deformity. IMPRESSION: 1. PowerPort catheter in stable position. 2.  No acute cardiopulmonary disease. Mild left base pleural-parenchymal scarring again noted. No mass lesion noted. Electronically Signed   By: Marcello Moores  Register   On: 11/06/2017 07:43    Medications: I have reviewed the patient's current medications.  Assessment/Plan: 56 y.o. 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  Severe calorie/protein malnutrition and remarkable and persistent electrolyte abnormalities.  Overall, patient continues to require significant amount of medical care.  He has recurrent hospitalizations for the previously described reasons.  Today, I have discussed the difficult time that we are having keeping him out of the hospital as well as the high risk of continuation of systemic therapy for his malignancy.  It is hard to withhold therapy from potentially curable cancer, and patient is insistent on continuing attempting to treat him with systemic chemotherapy.  **Lymphoma: We will reassess disease with CT of the chest/abdomen/pelvis and bone scan in lieu of obtaining PET/CT as patient remains hospitalized at this time.  If disease is responding to therapy, we do have better reason to continue attempting treatments.  If there is progressive disease, patient needs to be transitioned to supportive care at this time as he is unlikely to tolerate the second lines of chemotherapy for the lymphoma indication.  Restaging imaging with CT of the neck, chest, abdomen, pelvis and bone scan demonstrated positive response to previous therapy even with reduced intensity of the second cycle of chemo  and delay in the administration.. --Current severe combined calorie/protein malnutrition precludes renegotiation of  systemic chemotherapy without adequate nutrition support. At this time, it is unlikely that patient will resume adequate intake on his own. My recommendation is to proceed with placement of nasogastric or percutaneous gastric tube to provide enteral continuous nutrition or intermittent gastric nutritional supplementation. Patient is understandably apprehensive, but without at least a weeklong supply, I am unable to proceed with any further chemotherapy due to higher likelihood of further deterioration of performance status and potential increase chances of dying. --This plan was to consider initiation of systemic chemotherapy tomorrow, but with aspiration and current hypotension and tachycardia, initiating chemotherapy is too dangerous.  If patient stabilizes over the next 24-48 hours, we may yet administer systemic chemotherapy with dose reduced R-CHOP this week.  Otherwise, further delay will be necessary  **Electrolytes/Nutrition: Severe mixed calorie/protein malnutrition with a good portion of it having to do with complete lack of appetite and effort on the patient's behalf to improve his nutrition.  I have discussed alternative of enteral nutrition via nasogastric or percutaneous gastric tube versus parenteral nutrition through the vein.  In the context of recurrent infections, parenteral nutrition will have significant risk of causing severe infectious complications including fungal infections and I do not advocate for total parenteral nutrition at this time.  Patient is very apprehensive about tube feeds and promises to make himself eat more at this time. --Resume tube feeds when deemed safe by primary service.  Consider advancement of a jejunal tube through the PEG tube to reduce chances of further reflux/aspiration --Recommend continued follow-up by nutrition services with calorie counts and we will need additional recommendations regarding tube feeds --Continue electrolyte replacement therapy by IV  or by mouth as needed based on his daily labs.  **Disposition:  --At the discretion of primary treating service. --It is becoming increasingly difficult to conceive of resumption of systemic chemotherapy for this patient.  Nevertheless, continues to insist that he is interested in pursuing systemic therapy for his malignancy.    LOS: 21 days   Ardath Sax, MD   11/06/2017, 5:05 PM

## 2017-11-06 NOTE — Progress Notes (Signed)
Student nurse checked patient's vital signs. Patient's BP at 81/54 HR 139 on dinamap. Manual BP 85/56. RR 22. Md on the floor at the time. MD notified. Received orders to give 1L NS bolus; once finished check vital signs and do an check EKG. MD at bedside to talk to patient. Will continue to monitor patient.

## 2017-11-06 NOTE — Progress Notes (Signed)
CRITICAL VALUE ALERT  Critical Value:  MRSA swab positive  Date & Time Notied:  11/06/2017 1414  Provider Notified: Aileen Fass  Orders Received/Actions taken: Continue contact precautions

## 2017-11-06 NOTE — Progress Notes (Signed)
Pt  Had an episode of Nausea - vomited maybe 60cc into blue emesis bag ( bile colored) PEG tube residual was ,34ml. Osmolyte feeding place on hold. HR  Is 140-150s with B/B 70/32 and O2 sats initially 83% but came up to 93% RA

## 2017-11-06 NOTE — Progress Notes (Signed)
Pt continues to have hematuria, causing anxiety each time he passes urine / blood ( <100cc each occurrence) Reassurance provided, will continue to monitor.

## 2017-11-07 DIAGNOSIS — Z7189 Other specified counseling: Secondary | ICD-10-CM

## 2017-11-07 DIAGNOSIS — J690 Pneumonitis due to inhalation of food and vomit: Secondary | ICD-10-CM

## 2017-11-07 LAB — CBC
HEMATOCRIT: 26.8 % — AB (ref 39.0–52.0)
HEMOGLOBIN: 8.7 g/dL — AB (ref 13.0–17.0)
MCH: 27.4 pg (ref 26.0–34.0)
MCHC: 32.5 g/dL (ref 30.0–36.0)
MCV: 84.5 fL (ref 78.0–100.0)
Platelets: 138 10*3/uL — ABNORMAL LOW (ref 150–400)
RBC: 3.17 MIL/uL — AB (ref 4.22–5.81)
RDW: 18.7 % — ABNORMAL HIGH (ref 11.5–15.5)
WBC: 11.8 10*3/uL — AB (ref 4.0–10.5)

## 2017-11-07 LAB — PHOSPHORUS: Phosphorus: 2.2 mg/dL — ABNORMAL LOW (ref 2.5–4.6)

## 2017-11-07 LAB — BASIC METABOLIC PANEL
Anion gap: 6 (ref 5–15)
BUN: 12 mg/dL (ref 6–20)
CHLORIDE: 106 mmol/L (ref 101–111)
CO2: 24 mmol/L (ref 22–32)
Calcium: 6.8 mg/dL — ABNORMAL LOW (ref 8.9–10.3)
Creatinine, Ser: <0.3 mg/dL — ABNORMAL LOW (ref 0.61–1.24)
Glucose, Bld: 84 mg/dL (ref 65–99)
POTASSIUM: 3.6 mmol/L (ref 3.5–5.1)
SODIUM: 136 mmol/L (ref 135–145)

## 2017-11-07 LAB — ALBUMIN: ALBUMIN: 2 g/dL — AB (ref 3.5–5.0)

## 2017-11-07 LAB — LACTIC ACID, PLASMA
Lactic Acid, Venous: 0.7 mmol/L (ref 0.5–1.9)
Lactic Acid, Venous: 0.8 mmol/L (ref 0.5–1.9)

## 2017-11-07 LAB — MAGNESIUM: Magnesium: 1.9 mg/dL (ref 1.7–2.4)

## 2017-11-07 MED ORDER — SODIUM PHOSPHATES 45 MMOLE/15ML IV SOLN
10.0000 mmol | Freq: Once | INTRAVENOUS | Status: AC
  Administered 2017-11-07: 10 mmol via INTRAVENOUS
  Filled 2017-11-07: qty 3.33

## 2017-11-07 MED ORDER — METOCLOPRAMIDE HCL 5 MG/ML IJ SOLN
10.0000 mg | Freq: Four times a day (QID) | INTRAMUSCULAR | Status: DC
  Administered 2017-11-07 – 2017-11-08 (×4): 10 mg via INTRAVENOUS
  Filled 2017-11-07 (×4): qty 2

## 2017-11-07 MED ORDER — APIXABAN 5 MG PO TABS
5.0000 mg | ORAL_TABLET | Freq: Two times a day (BID) | ORAL | Status: DC
  Administered 2017-11-07 – 2017-11-14 (×15): 5 mg via ORAL
  Filled 2017-11-07 (×15): qty 1

## 2017-11-07 NOTE — Progress Notes (Signed)
[facility] for Eliquis Indication: hx recent pulmonary embolus  Allergies  Allergen Reactions  . Bee Venom Swelling    Patient Measurements: Height: 6\' 2"  (188 cm) Weight: 126 lb 15.8 oz (57.6 kg) IBW/kg (Calculated) : 82.2 Heparin Dosing Weight:   Vital Signs: Temp: 97.6 F (36.4 C) (12/05 0515) Temp Source: Oral (12/05 0515) BP: 102/62 (12/05 0950) Pulse Rate: 104 (12/05 0950)  Labs: Recent Labs    11/05/17 0313 11/06/17 0456 11/06/17 0537  HGB  --   --  8.7*  HCT  --   --  27.3*  PLT  --   --  194  CREATININE <0.30* 0.53*  --     Estimated Creatinine Clearance: 82 mL/min (A) (by C-G formula based on SCr of 0.53 mg/dL (L)).   Medications:  PTA: Eliquis 5 mg bid  Assessment: Patient is a 56 y.o with B cell lymphoma and recent PE (10/03/17) on eliquis PTA. He had hematuria d/t foley cath trauma on 12/3 with  Eliquis placed on hold. Bleeding resolved -- to resume Eliquis on 12/5.    Plan:  - resume Eliquis 5 mg bid - With good renal function, pharmacy will sign off for Eliquis but will follow patient peripherally  Cory Ibarra P 11/07/2017,10:39 AM

## 2017-11-07 NOTE — Progress Notes (Signed)
   11/07/17 1602  Clinical Encounter Type  Visited With Patient  Visit Type Follow-up  Spiritual Encounters  Spiritual Needs Emotional;Prayer  Stress Factors  Patient Stress Factors Health changes   Rounding on Palliative list with a  Patient that I have seen several times.  He shared about what happened yesterday with being sick from the feed tube and low blood pressure.  He heard what the medical staff has said in terms of what happens if the feeding tube does not work, but he is convinced it will work and that he will get better and go home.  He does have some level of anxiety and I don't think is ready to talk about any option but getting better.  He relies on his older brother to help speak with the medical staff.  We prayed together.  Will follow as needed. Chaplain Katherene Ponto

## 2017-11-07 NOTE — Progress Notes (Signed)
Daily Progress Note   Patient Name: Cory Ibarra       Date: 11/07/2017 DOB: 06/06/1961  Age: 56 y.o. MRN#: [id number] Attending Physician: Elodia Florence., * Primary Care Physician: [name], [name] Admit Date: 10/16/2017  Reason for Consultation/Follow-up: Establishing goals of care and Non pain symptom management  Subjective: I met with [name] today.  We reviewed his clinical course over the last 48 hours, including tube feeding, episode of vomiting, and likely aspiration event.  We discussed his hope for continued treatment as well as concern that his body is becoming weaker despite continued aggressive interventions.  He reports that the hospitalist who saw him yesterday "scared me" with conversation regarding dying.  I discussed with him my concerns that he may continue to aspirate tube feeds if they are restarted.  We discussed that if this is the case, he may acutely worsen.  We also discussed that this would indicate we are likely at a point where his body is never going to be strong enough to tolerate further disease modifying therapy.  He reports understanding this and states that he wants to attempt restarting tube feeds with understanding of risk.  Length of Stay: 22  Current Medications: Scheduled Meds:  . calcium carbonate  1,000 mg Oral TID  . feeding supplement (ENSURE ENLIVE)  237 mL Oral TID BM  . feeding supplement (OSMOLITE 1.2 CAL)  1,000 mL Per Tube Q24H  . magnesium oxide  400 mg Oral BID  . metoprolol tartrate  12.5 mg Oral BID  . multivitamin  15 mL Oral Daily  . OLANZapine zydis  5 mg Oral QHS  . oxyCODONE  10 mg Oral Q12H  . pantoprazole  40 mg Oral Q lunch  . potassium & sodium phosphates  1 packet Oral TID AC & HS  . predniSONE  20 mg Oral Q  breakfast  . sulfamethoxazole-trimethoprim  2 tablet Oral Once per day on Mon Wed Fri  . Vitamin D (Ergocalciferol)  50,000 Units Oral Q Mon    Continuous Infusions: . ampicillin-sulbactam (UNASYN) IV Stopped (11/07/17 0357)    PRN Meds: acetaminophen **OR** acetaminophen, iopamidol, labetalol, LORazepam, metoprolol tartrate, oxyCODONE, sodium chloride flush  Physical Exam     General: Alert, awake, in no acute distress. Lying in bed.  Pleasant and cooperative but  somewhat flat affect.   HEENT: No bruits, no goiter, no JVD.  Heart: Tachycardic. No murmur appreciated. Lungs: Good air movement, clear Abdomen: Soft, nontender, nondistended, positive bowel sounds.  Ext: No significant edema Skin: Warm and dry Neuro: Grossly intact, nonfocal.       Vital Signs: BP (!) 94/59 (BP Location: Right Arm)   Pulse (!) 104   Temp 97.6 F (36.4 C) (Oral)   Resp 12   Ht _0  (1.88 m)   Wt 57.6 kg (126 lb 15.8 oz)   SpO2 99%   BMI 16.30 kg/m  SpO2: SpO2: 99 % O2 Device: O2 Device: Not Delivered O2 Flow Rate: O2 Flow Rate (L/min): 1 L/min  Intake/output summary:   Intake/Output Summary (Last 24 hours) at 11/07/2017 0948 Last data filed at 11/07/2017 0600 Gross per 24 hour  Intake 620 ml  Output 1500 ml  Net -880 ml   LBM: Last BM Date: 11/05/17 Baseline Weight: Weight: 60.3 kg (132 lb 15 oz) Most recent weight: Weight: 57.6 kg (126 lb 15.8 oz)       Palliative Assessment/Data:    Flowsheet Rows     Most Recent Value  Intake Tab  Referral Department  Hospitalist  Unit at Time of Referral  Med/Surg Unit  Palliative Care Primary Diagnosis  Cancer  Date Notified  10/17/17  Palliative Care Type  Return patient Palliative Care  Reason for referral  Clarify Goals of Care  Date of Admission  10/16/17  Date first seen by Palliative Care  10/17/17  # of days Palliative referral response time  0 Day(s)  # of days IP prior to Palliative referral  1  Clinical Assessment    Psychosocial & Spiritual Assessment  Palliative Care Outcomes      Patient Active Problem List   Diagnosis Date Noted  . Sinus tachycardia 11/06/2017  . Lymphoma ([place]) 11/06/2017  . Lytic bone lesions on xray 11/06/2017  . Pressure injury of skin 10/04/2017  . Dehydration   . Hypoxia   . Pulmonary emboli ([place]) 10/03/2017  . Healthcare-associated pneumonia   . Obstructive uropathy   . Liver function test abnormality   . Renal calculus or stone 09/19/2017  . Leakage of nephrostomy catheter ([place]) 09/19/2017  . Nephrostomy status ([place]) 09/19/2017  . Gastroesophageal reflux disease without esophagitis 09/19/2017  . Hypokalemia 09/18/2017  . Hypomagnesemia 09/18/2017  . Electrolyte abnormality   . Hypovolemia 09/17/2017  . Chemotherapy-induced nausea 09/17/2017  . Constipation 09/17/2017  . 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 Hypocalcemia 09/17/2017  . Severe protein-calorie malnutrition ([place])   . Adjustment disorder with mixed anxiety and depressed mood   . Palliative care encounter   . Admission for antineoplastic chemotherapy   . Septic shock due to Escherichia coli ([place])   . Neutropenia ([place]) 08/21/2017  . Gross hematuria   . Lactic acidosis   . Neutropenic sepsis ([place])   . Hypophosphatemia 08/20/2017  . Diffuse large B cell lymphoma ([place]) 07/03/2017  . Bony metastasis ([place]) 06/29/2017  . Chest wall mass   . 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 Closed compression fracture of L3 lumbar vertebra ([place]) 06/28/2017  . Severe back pain 06/28/2017  . Weight loss, unintentional 06/28/2017    Palliative Care Assessment & Plan   Patient Profile: [name] a 56 y.o.malewith medical history significant ofB-cell lymphoma followed by oncology, recent diagnosis of PE, was placed on Eliquis,history of chronic hypokalemia, hypocalcemia, hypomagnesemia, chronic painsyndromewho presents to hospital with initial complaints of  gross hematuria through the  nephrostomy tube.  He is now s/p PEG placement with episode of vomiting and likely aspiration.   Recommendations/Plan:  Anorexia/cachexia/nutrition:  He is s/p PEG placement but with episode of vomiting and likely aspiration event.  We discussed options moving forward of restarting tube feeds despite risk to see if he can tolerate, taking only PO nutrition as he can tolerate, or deciding to forgo further chemotherapy and focusing on comfort moving forward.  He is hopeful to pursue further chemotherapy and desires retrial of tube feeding despite understanding risks.  I will plan for addition of scheduled reglan to see if any benefit in gastric motility.  Discussed with Dr. Florene Glen.    Will plan to f/u again once tube feeds restarted.  We did discuss today that if he does not tolerate tube feeds, this is likely sign that he is not going to reach a point where he will benefit from further chemotherapy.   Goals of Care and Additional Recommendations:  Limitations on Scope of Treatment: Full Scope Treatment  Code Status:    Code Status Orders  (From admission, onward)        Start     Ordered   10/16/17 1557  Do not attempt resuscitation (DNR)  Continuous    Question Answer Comment  In the event of cardiac or respiratory ARREST Do not call a "code blue"   In the event of cardiac or respiratory ARREST Do not perform Intubation, CPR, defibrillation or ACLS   In the event of cardiac or respiratory ARREST Use medication by any route, position, wound care, and other measures to relive pain and suffering. May use oxygen, suction and manual treatment of airway obstruction as needed for comfort.      10/16/17 1557    Code Status History    Date Active Date Inactive Code Status Order ID Comments User Context   10/03/2017 17:34 10/08/2017 16:55 Partial Code [id number]  Florencia Reasons, MD Inpatient   09/17/2017 17:57 09/25/2017 21:21 Partial Code [id number]  Ardath Sax, MD Inpatient   08/21/2017 20:19  09/03/2017 16:46 Partial Code [id number]  Omar Person, NP Inpatient   08/21/2017 17:18 08/21/2017 20:19 Full Code [id number]  Doreatha Lew, MD Inpatient   06/28/2017 14:21 07/03/2017 19:43 Full Code [id number]  Phillips Grout, MD ED    Advance Directive Documentation     Most Recent Value  Type of Advance Directive  Healthcare Power of Attorney  Pre-existing out of facility DNR order (yellow form or pink MOST form)  No data  "MOST" Form in Place?  No data       Prognosis:   Unable to determine  Discharge Planning:  To Be Determined  Care plan was discussed with patient, bedside RN  Thank you for allowing the Palliative Medicine Team to assist in the care of this patient.   Total Time 40 Prolonged Time Billed No      Greater than 50%  of this time was spent counseling and coordinating care related to the above assessment and plan.  Micheline Rough, MD  Please contact Palliative Medicine Team phone at [phone] for questions and concerns.

## 2017-11-07 NOTE — Progress Notes (Signed)
PROGRESS NOTE    Cory Ibarra  YWV:[id number] DOB: [birth year] DOA: 10/16/2017 PCP: [name], [name]    Brief Narrative:  Cory Ibarra is an 56 y.o. male past medical history recently diagnosed, with restaging CT scan of the neck chest abdomen and pelvis demonstrating positive response to chemotherapy, status post 2 cycles of therapy, underwent nephrostomy tube placement on the right due to stone, recent apixaban presents to the ED with hematuria from his nephrostomy tube to respond to resolve multiple lites imbalance and poor appetite, he was also during the workup in the ED found to be tachycardic.  His tachycardia resolved spontaneously his electrolytes were repleted and resolved. Interventional was consulted due to poor appetite.  Due to low poor appetite and ongoing weight loss interventional radiologist was consulted PEG tube was placed, and he has been progressive.  Due to significant back pain neurosurgery was consulted and CT scan of the neck was done that showed lytic lesion in C1-C4 with the progression of the pathologic fracture, neurosurgery deemed patient not a surgical candidate  Foley catheter was placed in the ED, Foley catheter, went reinserted patient started having bleeding through his urethra which is now clearing after holding Eliquis.  On 11/06/2017 large amount controlled tube feedings, 10 or 20 minutes after this he started vomiting.  Tachycardic, hypoxic and hypotensive.   Assessment & Plan:   Active Problems:   Severe back pain   Weight loss, unintentional   Hypophosphatemia   Gross hematuria   Hypocalcemia   Severe protein-calorie malnutrition (HCC)   Hypokalemia   Hypomagnesemia   Nephrostomy status (HCC)   Pulmonary emboli (HCC)   Sinus tachycardia   Lymphoma (HCC)   Lytic bone lesions on xray   Aspiration pneumonia due to regurgitated food ([place])   Goals of care, counseling/discussion  New aspiration pneumonia: Tachycardia improved, still with  soft BP's Lactate this morning normal Continue IVF Now on RA [ ]  EKG pending Will resume tube feedings, allow some PO if tolerating well. Palliative starting reglan.  Unable to reach IR today about advancement of jejunal tube, will discuss with IR tomorrow  Hypocalcemia/hypokalemia/hypophosphatemia: Electrolytes have been supplemented and monitored. Continues supplement and monitored regularly  Sinus tachycardia: Persistent, EKG pending  Depression: Continue Zyprexa.  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 Cannot get denosumab due to persistent hypocalcemia. MRI of the spine showed multiple lytic lesions and compression fraction of L3, neuro surgery was consulted and recommended no intervention as he is not a candidate. Physical therapy was consulted. Management per oncology  History of PE: Restart eliquis, he notes hematuria is improving after traumatic foley  Gross hematuria/Right urethral stone/neuropathy with Nephrostomy status ([place]): This has resolved, urology was consulted and recommended nephrostomy tube change monthly. Last change on 10/04/2017.  Severe protein caloric malnutrition/  Weight loss, unintentional: Will plan to restart tube feeds.  DVT prophylaxis: eliquis Code Status: DNR Family Communication: none at bedise Disposition Plan: pending stabilization, tolerating tube feeds  Consultants:   Urology  PCT  Oncology  Neurosurgery  IR  Procedures: (Don't include imaging studies which can be auto populated. Include things that cannot be auto populated i.e. Echo, Carotid and venous dopplers, Foley, Bipap, HD, tubes/drains, wound vac, central lines etc)  G tube placed 11/30  Antimicrobials: (specify start and planned stop date. Auto populated tables are space occupying and do not give end  dates) Anti-infectives (From admission, onward)   Start     Dose/Rate Route Frequency Ordered Stop   11/06/17 1000  ampicillin-sulbactam (UNASYN) 1.5 g in sodium chloride 0.9 % 50 mL IVPB  Status:  Discontinued     1.5 g 100 mL/hr over 30 Minutes Intravenous Every 6 hours 11/06/17 0847 11/06/17 0907   11/06/17 0930  Ampicillin-Sulbactam (UNASYN) 3 g in sodium chloride 0.9 % 100 mL IVPB     3 g 200 mL/hr over 30 Minutes Intravenous Every 6 hours 11/06/17 0907     11/02/17 0830  ceFAZolin (ANCEF) IVPB 2g/100 mL premix     2 g 200 mL/hr over 30 Minutes Intravenous To Surgery 11/02/17 0815 11/02/17 0916   11/02/17 0820  ceFAZolin (ANCEF) 2-4 GM/100ML-% IVPB    Comments:  [name]   : cabinet override      11/02/17 0820 11/02/17 0839   11/02/17 0815  ceFAZolin (ANCEF) powder 2 g  Status:  Discontinued     2 g Other To Surgery 11/02/17 0813 11/02/17 0815   11/02/17 0600  clindamycin (CLEOCIN) IVPB 900 mg  Status:  Discontinued     900 mg 100 mL/hr over 30 Minutes Intravenous 30 min pre-op 10/31/17 1415 11/06/17 0836   10/17/17 1000  sulfamethoxazole-trimethoprim (BACTRIM,SEPTRA) 200-40 MG/5ML suspension 20 mL  Status:  Discontinued    Comments:  Continuous     20 mL Oral Every M-W-F 10/16/17 1558 10/17/17 0936   10/17/17 1000  sulfamethoxazole-trimethoprim (BACTRIM,SEPTRA) 400-80 MG per tablet 2 tablet     2 tablet Oral Once per day on Mon Wed Fri 10/17/17 0936          Subjective: Feeling well this morning.   Blood in urine is better. Has cough, but no CP or SOB. No belly pain.  Objective: Vitals:   11/06/17 2152 11/07/17 0500 11/07/17 0515 11/07/17 0950  BP: (!) 86/51  (!) 94/59 102/62  Pulse: (!) 102  (!) 104 (!) 104  Resp:   12   Temp: 98.5 F (36.9 C)  97.6 F (36.4 C)   TempSrc: Oral  Oral   SpO2: 99%  99%   Weight:  57.6 kg (126 lb 15.8 oz)    Height:        Intake/Output Summary (Last 24 hours) at 11/07/2017 1024 Last data filed at 11/07/2017 0600 Gross per 24  hour  Intake 620 ml  Output 1375 ml  Net -755 ml   Filed Weights   10/16/17 1845 10/27/17 2202 11/07/17 0500  Weight: 60.3 kg (132 lb 15 oz) 58.7 kg (129 lb 6.6 oz) 57.6 kg (126 lb 15.8 oz)    Examination:  General exam: Appears calm and comfortable.  Thin.  In good spirits. Respiratory system: Clear to auscultation. Respiratory effort normal. Cardiovascular system: S1 & S2 heard, RRR. No JVD, murmurs, rubs, gallops or clicks. No pedal edema. Gastrointestinal system: Abdomen is nondistended, soft and nontender. No organomegaly or masses felt. Normal bowel sounds heard.  Gtube.  R nephrostomy tube. Central nervous system: Alert and oriented. No focal neurological deficits. Extremities: Symmetric 5 x 5 power. Skin: No rashes, lesions or ulcers Psychiatry: Judgement and insight appear normal. Mood & affect appropriate.     Data Reviewed: I have personally reviewed following labs and imaging studies  CBC: Recent Labs  Lab 11/02/17 0358 11/06/17 0537  WBC 5.1 16.1*  NEUTROABS 4.0  --   HGB 9.6* 8.7*  HCT 30.1* 27.3*  MCV 84.8 84.5  PLT 257 194  Basic Metabolic Panel: Recent Labs  Lab 11/02/17 0358 11/03/17 0510 11/04/17 0500 11/05/17 0313 11/06/17 0456 11/07/17 0354  NA 135  --  134* 133* 133*  --   K 3.6  --  3.6 3.7 3.7  --   CL 102  --  99* 101 101  --   CO2 27  --  27 29 23   --   GLUCOSE 89  --  129* 122* 153*  --   BUN 5*  --  10 8 12   --   CREATININE <0.30*  --  <0.30* <0.30* 0.53*  --   CALCIUM 7.2*  --  7.0* 7.0* 7.4*  --   MG 2.0 2.1 2.0 2.0 1.8 1.9  PHOS 2.9 2.8 2.0* 2.5 1.5* 2.2*   GFR: Estimated Creatinine Clearance: 82 mL/min (A) (by C-G formula based on SCr of 0.53 mg/dL (L)). Liver Function Tests: No results for input(s): AST, ALT, ALKPHOS, BILITOT, PROT, ALBUMIN in the last 168 hours. No results for input(s): LIPASE, AMYLASE in the last 168 hours. No results for input(s): AMMONIA in the last 168 hours. Coagulation Profile: Recent Labs  Lab  11/02/17 0358  INR 1.12   Cardiac Enzymes: No results for input(s): CKTOTAL, CKMB, CKMBINDEX, TROPONINI in the last 168 hours. BNP (last 3 results) No results for input(s): PROBNP in the last 8760 hours. HbA1C: No results for input(s): HGBA1C in the last 72 hours. CBG: No results for input(s): GLUCAP in the last 168 hours. Lipid Profile: No results for input(s): CHOL, HDL, LDLCALC, TRIG, CHOLHDL, LDLDIRECT in the last 72 hours. Thyroid Function Tests: No results for input(s): TSH, T4TOTAL, FREET4, T3FREE, THYROIDAB in the last 72 hours. Anemia Panel: No results for input(s): VITAMINB12, FOLATE, FERRITIN, TIBC, IRON, RETICCTPCT in the last 72 hours. Sepsis Labs: No results for input(s): PROCALCITON, LATICACIDVEN in the last 168 hours.  Recent Results (from the past 240 hour(s))  MRSA PCR Screening     Status: Abnormal   Collection Time: 11/06/17 11:26 AM  Result Value Ref Range Status   MRSA by PCR POSITIVE (A) NEGATIVE Final    Comment:        The GeneXpert MRSA Assay (FDA approved for NASAL specimens only), is one component of a comprehensive MRSA colonization surveillance program. It is not intended to diagnose MRSA infection nor to guide or monitor treatment for MRSA infections. RESULT CALLED TO, READ BACK BY AND VERIFIED WITH: KAUR,B. RN @1412  ON 12.04.18 BY COHEN,K          Radiology Studies: Dg Chest Port 1 View  Result Date: 11/06/2017 CLINICAL DATA:  Cough. EXAM: PORTABLE CHEST 1 VIEW COMPARISON:  CT 10/19/2017.  Chest x-ray 10/03/2017. FINDINGS: PowerPort catheter in stable position. Mediastinum hilar structures are normal. Lungs are clear of acute infiltrates. Stable mild left base pleural-parenchymal thickening consistent scarring. No focal infiltrate. Reference is made to prior chest CT report for discussion of tiny pulmonary nodule in the left lung. No prominent pleural effusion. No pneumothorax. Stable left posterior-lateral third rib deformity.  IMPRESSION: 1. PowerPort catheter in stable position. 2. No acute cardiopulmonary disease. Mild left base pleural-parenchymal scarring again noted. No mass lesion noted. Electronically Signed   By: Marcello Moores  Register   On: 11/06/2017 07:43        Scheduled Meds: . calcium carbonate  1,000 mg Oral TID  . feeding supplement (ENSURE ENLIVE)  237 mL Oral TID BM  . feeding supplement (OSMOLITE 1.2 CAL)  1,000 mL Per Tube Q24H  . magnesium oxide  400 mg Oral BID  . metoCLOPramide (REGLAN) injection  10 mg Intravenous Q6H  . metoprolol tartrate  12.5 mg Oral BID  . multivitamin  15 mL Oral Daily  . OLANZapine zydis  5 mg Oral QHS  . oxyCODONE  10 mg Oral Q12H  . pantoprazole  40 mg Oral Q lunch  . potassium & sodium phosphates  1 packet Oral TID AC & HS  . predniSONE  20 mg Oral Q breakfast  . sulfamethoxazole-trimethoprim  2 tablet Oral Once per day on Mon Wed Fri  . Vitamin D (Ergocalciferol)  50,000 Units Oral Q Mon   Continuous Infusions: . ampicillin-sulbactam (UNASYN) IV 3 g (11/07/17 0949)  . sodium phosphate  Dextrose 5% IVPB       LOS: 22 days    Time spent: over 30 minutes    Fayrene Helper, MD [facility] Pager [phone]  If 7PM-7AM, please contact night-coverage [email or web address] Password [email or web address] 11/07/2017, 10:24 AM

## 2017-11-08 DIAGNOSIS — C8590 Non-Hodgkin lymphoma, unspecified, unspecified site: Secondary | ICD-10-CM

## 2017-11-08 LAB — COMPREHENSIVE METABOLIC PANEL
ALBUMIN: 1.9 g/dL — AB (ref 3.5–5.0)
ALT: 18 U/L (ref 17–63)
ANION GAP: 6 (ref 5–15)
AST: 23 U/L (ref 15–41)
Alkaline Phosphatase: 104 U/L (ref 38–126)
BUN: 10 mg/dL (ref 6–20)
CHLORIDE: 105 mmol/L (ref 101–111)
CO2: 25 mmol/L (ref 22–32)
Calcium: 6.9 mg/dL — ABNORMAL LOW (ref 8.9–10.3)
Creatinine, Ser: <0.3 mg/dL — ABNORMAL LOW (ref 0.61–1.24)
GLUCOSE: 78 mg/dL (ref 65–99)
POTASSIUM: 3.6 mmol/L (ref 3.5–5.1)
Sodium: 136 mmol/L (ref 135–145)
Total Bilirubin: 0.8 mg/dL (ref 0.3–1.2)
Total Protein: 4.4 g/dL — ABNORMAL LOW (ref 6.5–8.1)

## 2017-11-08 LAB — CBC
HCT: 27.1 % — ABNORMAL LOW (ref 39.0–52.0)
Hemoglobin: 8.5 g/dL — ABNORMAL LOW (ref 13.0–17.0)
MCH: 26.5 pg (ref 26.0–34.0)
MCHC: 31.4 g/dL (ref 30.0–36.0)
MCV: 84.4 fL (ref 78.0–100.0)
PLATELETS: 151 10*3/uL (ref 150–400)
RBC: 3.21 MIL/uL — ABNORMAL LOW (ref 4.22–5.81)
RDW: 18.4 % — AB (ref 11.5–15.5)
WBC: 10.3 10*3/uL (ref 4.0–10.5)

## 2017-11-08 LAB — MAGNESIUM: MAGNESIUM: 1.9 mg/dL (ref 1.7–2.4)

## 2017-11-08 LAB — GLUCOSE, CAPILLARY
GLUCOSE-CAPILLARY: 110 mg/dL — AB (ref 65–99)
GLUCOSE-CAPILLARY: 112 mg/dL — AB (ref 65–99)
GLUCOSE-CAPILLARY: 184 mg/dL — AB (ref 65–99)
Glucose-Capillary: 151 mg/dL — ABNORMAL HIGH (ref 65–99)

## 2017-11-08 LAB — URINE CULTURE

## 2017-11-08 LAB — PHOSPHORUS: PHOSPHORUS: 1.9 mg/dL — AB (ref 2.5–4.6)

## 2017-11-08 MED ORDER — JEVITY 1.2 CAL PO LIQD
1000.0000 mL | ORAL | Status: DC
Start: 2017-11-08 — End: 2017-11-08

## 2017-11-08 MED ORDER — METOCLOPRAMIDE HCL 5 MG/ML IJ SOLN
10.0000 mg | Freq: Four times a day (QID) | INTRAMUSCULAR | Status: DC
  Administered 2017-11-08 – 2017-11-11 (×6): 10 mg via INTRAVENOUS
  Filled 2017-11-08 (×8): qty 2

## 2017-11-08 MED ORDER — PRO-STAT SUGAR FREE PO LIQD
30.0000 mL | Freq: Every day | ORAL | Status: DC
  Administered 2017-11-09 – 2017-11-13 (×5): 30 mL
  Filled 2017-11-08 (×5): qty 30

## 2017-11-08 MED ORDER — OSMOLITE 1.5 CAL PO LIQD
1000.0000 mL | ORAL | Status: DC
  Administered 2017-11-08 – 2017-11-13 (×5): 1000 mL
  Filled 2017-11-08 (×11): qty 1000

## 2017-11-08 MED ORDER — METOCLOPRAMIDE HCL 10 MG PO TABS
10.0000 mg | ORAL_TABLET | Freq: Four times a day (QID) | ORAL | Status: DC
  Administered 2017-11-08 – 2017-11-14 (×21): 10 mg via ORAL
  Filled 2017-11-08 (×21): qty 1

## 2017-11-08 MED ORDER — POTASSIUM PHOSPHATES 15 MMOLE/5ML IV SOLN
20.0000 mmol | Freq: Once | INTRAVENOUS | Status: AC
  Administered 2017-11-08: 20 mmol via INTRAVENOUS
  Filled 2017-11-08: qty 6.67

## 2017-11-08 NOTE — Progress Notes (Signed)
Nutrition Follow-up  DOCUMENTATION CODES:   Severe malnutrition in context of chronic illness, Underweight  INTERVENTION:   Pt is at high risk for refeeding. Monitor and supplement electrolytes as needed per MD descretion.    Initiate Osmolite 1.5 @ 30 ml/hr, advance by 10 ml every 12 hours to goal rate of 60 ml/hr. 30 ml Prostat daily per tube. This regimen provides 2260 kcal, 105g protein and 1097 ml H2O.  RD will continue to monitor  NUTRITION DIAGNOSIS:   Severe Malnutrition related to cancer and cancer related treatments, chronic illness as evidenced by percent weight loss, energy intake < or equal to 75% for > or equal to 1 month, severe fat depletion, moderate muscle depletion.  Ongoing.  GOAL:   Patient will meet greater than or equal to 90% of their needs  Progressing.  MONITOR:   PO intake, Supplement acceptance, Labs, Weight trends, Skin, I & O's, TF tolerance  REASON FOR ASSESSMENT:   Consult Enteral/tube feeding initiation and management  ASSESSMENT:   56 y.o. 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  Of note, patient was recently diagnosed with pulmonary embolism and has been continued on Eliquis prior to admission.  During workup, patient was also noted to be tachycardic and was subsequently referred to the emergency department for further workup.  Patient resumed TF last night. RD consulted for initiation and management, RD placed order for TF protocol so daily weights will be obtained and also an order for elevated head of bed was placed as part of the protocol (which will help prevent aspiration).  RD switched formula to Osmolite 1.5 to better meet needs with less volume per recommendations from 12/4. RD explained changes to RN via  phone.  Medications: MAG-OX tablet BID, IV Reglan every 6 hours, Liquid MVI daily, Protonix tablet daily, PHOS-NAK packet QID, Vitamin D capsule weekly Labs reviewed: Low Phos Mg/K WNL  Diet Order:  Diet NPO time specified  EDUCATION NEEDS:   Education needs have been addressed  Skin:  Skin Assessment: Skin Integrity Issues: Skin Integrity Issues:: Stage II Stage II: ischial tuberosity  Last BM:  11/04/17  Height:   Ht Readings from Last 1 Encounters:  10/16/17 6\' 2"  (1.88 m)    Weight:   Wt Readings from Last 1 Encounters:  11/08/17 127 lb 13.9 oz (58 kg)    Ideal Body Weight:  86.36 kg  BMI:  Body mass index is 16.42 kg/m.  Estimated Nutritional Needs:   Kcal:  2150-2350 kcal/day  Protein:  105-115 g/day  Fluid:  >2.2 L/day  Clayton Bibles, MS, RD, LDN [facility] Pager: [phone] After Hours Pager: [phone]

## 2017-11-08 NOTE — Progress Notes (Signed)
PT Cancellation Note  Patient Details Name: Cory Ibarra MRN: [id number] DOB: 1961-05-25   Cancelled Treatment:    Reason Eval/Treat Not Completed: Medical issues which prohibited therapy. Patient states,"not today. I just have too much going on".   Claretha Cooper 11/08/2017, 4:18 PM Tresa Endo PT [phone]

## 2017-11-08 NOTE — Progress Notes (Signed)
LCSW following for SNF placement.  Patient will return to [facility] at [place].   Patient not medically stable to dc today.  LCSW will continue to follow.   Carolin Coy [place] [facility] [place]

## 2017-11-08 NOTE — Progress Notes (Signed)
PROGRESS NOTE    Cory Ibarra  LAG:[id number] DOB: 01/21/1961 DOA: 10/16/2017 PCP: [name], [name]    Brief Narrative:  Cory Ibarra is an 56 y.o. male past medical history recently diagnosed, with restaging CT scan of the neck chest abdomen and pelvis demonstrating positive response to chemotherapy, status post 2 cycles of therapy, underwent nephrostomy tube placement on the right due to stone, recent apixaban presents to the ED with hematuria from his nephrostomy tube to respond to resolve multiple lites imbalance and poor appetite, he was also during the workup in the ED found to be tachycardic.  His tachycardia resolved spontaneously his electrolytes were repleted and resolved. Interventional was consulted due to poor appetite.  Due to low poor appetite and ongoing weight loss interventional radiologist was consulted PEG tube was placed, and he has been progressive.  Due to significant back pain neurosurgery was consulted and CT scan of the neck was done that showed lytic lesion in C1-C4 with the progression of the pathologic fracture, neurosurgery deemed patient not Cory Ibarra surgical candidate  Foley catheter was placed in the ED, Foley catheter, went reinserted patient started having bleeding through his urethra which is now clearing after holding Eliquis.  On 11/06/2017 large amount controlled tube feedings, 10 or 20 minutes after this he started vomiting.  Tachycardic, hypoxic and hypotensive.   Assessment & Plan:   Active Problems:   Severe back pain   Weight loss, unintentional   Hypophosphatemia   Gross hematuria   Hypocalcemia   Severe protein-calorie malnutrition (HCC)   Hypokalemia   Hypomagnesemia   Nephrostomy status (HCC)   Pulmonary emboli (HCC)   Sinus tachycardia   Lymphoma (HCC)   Lytic bone lesions on xray   Aspiration pneumonia due to regurgitated food (Cory Ibarra)   Goals of care, counseling/discussion  New aspiration pneumonia: Tachycardia and BP improved Unasyn  ordered from 12/4-12/8 (5 day course) Continue IVF Now on RA  Will resume tube feedings, allow some PO as well Palliative started reglan Unable to reach IR 12/5 (paged multiple times) about conversion of g tube to j tube, will discuss with IR today (discussed with IR today, 12/6, and consult placed)  Hypocalcemia/hypokalemia/hypophosphatemia: Electrolytes have been supplemented and monitored. Continues supplement and monitored regularly Encouraged him to take calcium  Sinus tachycardia: Improved, EKG with sinus tach  Depression: Continue Zyprexa.  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 Cannot get denosumab due to persistent hypocalcemia. MRI of the spine showed multiple lytic lesions and compression fraction of L3, neuro surgery was consulted and recommended no intervention as he is not Cory Ibarra candidate. Physical therapy was consulted. Management per oncology Dr. [name] (see note from 12/4) planning to resume chemotherapy, but wants pt to have tolerated tube feeds for at least 1 week prior to restarting chemotherapy.  Likely will f/u after tube feeds stable and resume reduced [name] at that time (pt may need to f/u with provider other than Dr. [name] as he'll be out in coming weeks, need to touch base with oncology prior to discharge).  History of PE: Restart eliquis, he notes hematuria is improving after traumatic foley  Gross hematuria/Right urethral stone/neuropathy with Nephrostomy status (Cory Ibarra): This has resolved, urology was consulted and recommended nephrostomy tube change monthly. Last change on 10/04/2017.  Severe protein caloric malnutrition/  Weight loss, unintentional: Will plan to restart tube feeds. Nutrition c/s  DVT  prophylaxis: eliquis Code Status: DNR Family Communication: none at bedise Disposition Plan: pending  stabilization, tolerating tube feeds  Consultants:   Urology  PCT  Oncology  Neurosurgery  IR  Procedures: (Don't include imaging studies which can be auto populated. Include things that cannot be auto populated i.e. Echo, Carotid and venous dopplers, Foley, Bipap, HD, tubes/drains, wound vac, central lines etc)  G tube placed 11/30  Antimicrobials: (specify start and planned stop date. Auto populated tables are space occupying and do not give end dates) Anti-infectives (From admission, onward)   Start     Dose/Rate Route Frequency Ordered Stop   11/06/17 1000  ampicillin-sulbactam (UNASYN) 1.5 g in sodium chloride 0.9 % 50 mL IVPB  Status:  Discontinued     1.5 g 100 mL/hr over 30 Minutes Intravenous Every 6 hours 11/06/17 0847 11/06/17 0907   11/06/17 0930  Ampicillin-Sulbactam (UNASYN) 3 g in sodium chloride 0.9 % 100 mL IVPB     3 g 200 mL/hr over 30 Minutes Intravenous Every 6 hours 11/06/17 0907     11/02/17 0830  ceFAZolin (ANCEF) IVPB 2g/100 mL premix     2 g 200 mL/hr over 30 Minutes Intravenous To Surgery 11/02/17 0815 11/02/17 0916   11/02/17 0820  ceFAZolin (ANCEF) 2-4 GM/100ML-% IVPB    Comments:  [name]   : cabinet override      11/02/17 0820 11/02/17 0839   11/02/17 0815  ceFAZolin (ANCEF) powder 2 g  Status:  Discontinued     2 g Other To Surgery 11/02/17 0813 11/02/17 0815   11/02/17 0600  clindamycin (CLEOCIN) IVPB 900 mg  Status:  Discontinued     900 mg 100 mL/hr over 30 Minutes Intravenous 30 min pre-op 10/31/17 1415 11/06/17 0836   10/17/17 1000  sulfamethoxazole-trimethoprim (BACTRIM,SEPTRA) 200-40 MG/5ML suspension 20 mL  Status:  Discontinued    Comments:  Continuous     20 mL Oral Every M-W-F 10/16/17 1558 10/17/17 0936   10/17/17 1000  sulfamethoxazole-trimethoprim (BACTRIM,SEPTRA) 400-80 MG per tablet 2 tablet     2 tablet Oral Once per day on Mon Wed Fri 10/17/17 0936          Subjective: No complaints. Notes chronic back pain. No  CP or SOB.  Objective: Vitals:   11/07/17 1410 11/07/17 2058 11/08/17 0426 11/08/17 0800  BP: 101/63 107/72 114/74 112/72  Pulse: (!) 106 100 95 98  Resp:  20 20 20   Temp: 98 F (36.7 C) 97.8 F (36.6 C) (!) 97.5 F (36.4 C) 98.2 F (36.8 C)  TempSrc: Oral Oral Oral Oral  SpO2: 100% 100% 100% 100%  Weight:    58 kg (127 lb 13.9 oz)  Height:        Intake/Output Summary (Last 24 hours) at 11/08/2017 1040 Last data filed at 11/08/2017 0426 Gross per 24 hour  Intake 453.33 ml  Output 1050 ml  Net -596.67 ml   Filed Weights   10/27/17 2202 11/07/17 0500 11/08/17 0800  Weight: 58.7 kg (129 lb 6.6 oz) 57.6 kg (126 lb 15.8 oz) 58 kg (127 lb 13.9 oz)    Examination:  General: No acute distress.  Cachetic Cardiovascular: Heart sounds show Aelyn Stanaland regular rate, and rhythm. No gallops or rubs. No murmurs. No JVD. Lungs: Clear to auscultation bilaterally with good air movement. No rales, rhonchi or wheezes. Abdomen: Soft, nontender, nondistended with normal active bowel sounds. No masses. No hepatosplenomegaly.  R nephrostomy tube. Neurological: Alert and oriented 3. Moves all extremities 4 with  equal strength. Cranial nerves II through XII grossly intact. Skin: Warm and dry. No rashes or lesions. Extremities: No clubbing or cyanosis. No edema.  Psychiatric: Mood and affect are normal. Insight and judgment are appropriate.   Data Reviewed: I have personally reviewed following labs and imaging studies  CBC: Recent Labs  Lab 11/02/17 0358 11/06/17 0537 11/07/17 1041 11/08/17 0416  WBC 5.1 16.1* 11.8* 10.3  NEUTROABS 4.0  --   --   --   HGB 9.6* 8.7* 8.7* 8.5*  HCT 30.1* 27.3* 26.8* 27.1*  MCV 84.8 84.5 84.5 84.4  PLT 257 194 138* 270   Basic Metabolic Panel: Recent Labs  Lab 11/04/17 0500 11/05/17 0313 11/06/17 0456 11/07/17 0354 11/07/17 1041 11/08/17 0416  NA 134* 133* 133*  --  136 136  K 3.6 3.7 3.7  --  3.6 3.6  CL 99* 101 101  --  106 105  CO2 27 29 23   --  24  25  GLUCOSE 129* 122* 153*  --  84 78  BUN 10 8 12   --  12 10  CREATININE <0.30* <0.30* 0.53*  --  <0.30* <0.30*  CALCIUM 7.0* 7.0* 7.4*  --  6.8* 6.9*  MG 2.0 2.0 1.8 1.9  --  1.9  PHOS 2.0* 2.5 1.5* 2.2*  --  1.9*   GFR: CrCl cannot be calculated (This lab value cannot be used to calculate CrCl because it is not Jonathen Rathman number: <0.30). Liver Function Tests: Recent Labs  Lab 11/07/17 1431 11/08/17 0416  AST  --  23  ALT  --  18  ALKPHOS  --  104  BILITOT  --  0.8  PROT  --  4.4*  ALBUMIN 2.0* 1.9*   No results for input(s): LIPASE, AMYLASE in the last 168 hours. No results for input(s): AMMONIA in the last 168 hours. Coagulation Profile: Recent Labs  Lab 11/02/17 0358  INR 1.12   Cardiac Enzymes: No results for input(s): CKTOTAL, CKMB, CKMBINDEX, TROPONINI in the last 168 hours. BNP (last 3 results) No results for input(s): PROBNP in the last 8760 hours. HbA1C: No results for input(s): HGBA1C in the last 72 hours. CBG: No results for input(s): GLUCAP in the last 168 hours. Lipid Profile: No results for input(s): CHOL, HDL, LDLCALC, TRIG, CHOLHDL, LDLDIRECT in the last 72 hours. Thyroid Function Tests: No results for input(s): TSH, T4TOTAL, FREET4, T3FREE, THYROIDAB in the last 72 hours. Anemia Panel: No results for input(s): VITAMINB12, FOLATE, FERRITIN, TIBC, IRON, RETICCTPCT in the last 72 hours. Sepsis Labs: Recent Labs  Lab 11/07/17 1041 11/07/17 1432  LATICACIDVEN 0.7 0.8    Recent Results (from the past 240 hour(s))  MRSA PCR Screening     Status: Abnormal   Collection Time: 11/06/17 11:26 AM  Result Value Ref Range Status   MRSA by PCR POSITIVE (Marion Rosenberry) NEGATIVE Final    Comment:        The GeneXpert MRSA Assay (FDA approved for NASAL specimens only), is one component of [facility] comprehensive MRSA colonization surveillance program. It is not intended to diagnose MRSA infection nor to guide or monitor treatment for MRSA infections. RESULT CALLED TO, READ BACK  BY AND VERIFIED WITH: KAUR,B. RN @1412  ON 12.04.18 BY COHEN,K   Culture, Urine     Status: Abnormal   Collection Time: 11/07/17 10:30 AM  Result Value Ref Range Status   Specimen Description URINE, CLEAN CATCH  Final   Special Requests NONE  Final   Culture (Natsha Guidry)  Final    <  10,000 COLONIES/mL INSIGNIFICANT GROWTH Performed at [facility], [place] [address], [place], [place] [address]    Report Status 11/08/2017 FINAL  Final         Radiology Studies: No results found.      Scheduled Meds: . apixaban  5 mg Oral BID  . calcium carbonate  1,000 mg Oral TID  . magnesium oxide  400 mg Oral BID  . metoCLOPramide (REGLAN) injection  10 mg Intravenous Q6H  . metoprolol tartrate  12.5 mg Oral BID  . multivitamin  15 mL Oral Daily  . OLANZapine zydis  5 mg Oral QHS  . oxyCODONE  10 mg Oral Q12H  . pantoprazole  40 mg Oral Q lunch  . potassium & sodium phosphates  1 packet Oral TID AC & HS  . predniSONE  20 mg Oral Q breakfast  . sulfamethoxazole-trimethoprim  2 tablet Oral Once per day on Mon Wed Fri  . Vitamin D (Ergocalciferol)  50,000 Units Oral Q Mon   Continuous Infusions: . ampicillin-sulbactam (UNASYN) IV Stopped (11/08/17 0943)  . feeding supplement (OSMOLITE 1.5 CAL)    . potassium PHOSPHATE IVPB (mmol)       LOS: 23 days    Time spent: over 30 minutes    Fayrene Helper, MD [facility] Pager [phone]  If 7PM-7AM, please contact night-coverage [email or web address] Password [email or web address] 11/08/2017, 10:40 AM

## 2017-11-08 NOTE — Progress Notes (Signed)
Daily Progress Note   Patient Name: Cory Ibarra       Date: 11/08/2017 DOB: 1960/12/28  Age: 56 y.o. MRN#: [id number] Attending Physician: Elodia Florence., * Primary Care Physician: [name], [name] Admit Date: 10/16/2017  Reason for Consultation/Follow-up: Establishing goals of care and Non pain symptom management  Subjective: I met with Cory Ibarra today.  He reports feeling "much better" today and has been tolerating restarting of tube feeds without complications.  He reports wanting to continue with advancing tube feeds as planned.  Length of Stay: 23  Current Medications: Scheduled Meds:  . apixaban  5 mg Oral BID  . calcium carbonate  1,000 mg Oral TID  . feeding supplement (PRO-STAT SUGAR FREE 64)  30 mL Per Tube Daily  . magnesium oxide  400 mg Oral BID  . metoCLOPramide (REGLAN) injection  10 mg Intravenous Q6H  . metoprolol tartrate  12.5 mg Oral BID  . multivitamin  15 mL Oral Daily  . OLANZapine zydis  5 mg Oral QHS  . oxyCODONE  10 mg Oral Q12H  . pantoprazole  40 mg Oral Q lunch  . potassium & sodium phosphates  1 packet Oral TID AC & HS  . predniSONE  20 mg Oral Q breakfast  . sulfamethoxazole-trimethoprim  2 tablet Oral Once per day on Mon Wed Fri  . Vitamin D (Ergocalciferol)  50,000 Units Oral Q Mon    Continuous Infusions: . ampicillin-sulbactam (UNASYN) IV Stopped (11/08/17 0943)  . feeding supplement (OSMOLITE 1.5 CAL) 1,000 mL (11/08/17 1103)  . potassium PHOSPHATE IVPB (mmol) 20 mmol (11/08/17 1108)    PRN Meds: acetaminophen **OR** acetaminophen, iopamidol, labetalol, LORazepam, metoprolol tartrate, oxyCODONE, sodium chloride flush  Physical Exam     General: Alert, awake, in no acute distress. Lying in bed.  Pleasant and cooperative but  somewhat flat affect.   HEENT: No bruits, no goiter, no JVD.  Heart: Tachycardic. No murmur appreciated. Lungs: Good air movement, clear Abdomen: Soft, nontender, nondistended, positive bowel sounds.  Ext: No significant edema Skin: Warm and dry Neuro: Grossly intact, nonfocal.       Vital Signs: BP 112/72 (BP Location: Right Arm)   Pulse 98   Temp 98.2 F (36.8 C) (Oral)   Resp 20   Ht 6' 2"  (1.88 m)   Wt  58 kg (127 lb 13.9 oz)   SpO2 100%   BMI 16.42 kg/m  SpO2: SpO2: 100 % O2 Device: O2 Device: Not Delivered O2 Flow Rate: O2 Flow Rate (L/min): 1 L/min  Intake/output summary:   Intake/Output Summary (Last 24 hours) at 11/08/2017 1115 Last data filed at 11/08/2017 0426 Gross per 24 hour  Intake 100 ml  Output 1050 ml  Net -950 ml   LBM: Last BM Date: 11/05/17 Baseline Weight: Weight: 60.3 kg (132 lb 15 oz) Most recent weight: Weight: 58 kg (127 lb 13.9 oz)       Palliative Assessment/Data:    Flowsheet Rows     Most Recent Value  Intake Tab  Referral Department  Hospitalist  Unit at Time of Referral  Med/Surg Unit  Palliative Care Primary Diagnosis  Cancer  Date Notified  10/17/17  Palliative Care Type  Return patient Palliative Care  Reason for referral  Clarify Goals of Care  Date of Admission  10/16/17  Date first seen by Palliative Care  10/17/17  # of days Palliative referral response time  0 Day(s)  # of days IP prior to Palliative referral  1  Clinical Assessment  Psychosocial & Spiritual Assessment  Palliative Care Outcomes      Patient Active Problem List   Diagnosis Date Noted  . Aspiration pneumonia due to regurgitated food ([place])   . Goals of care, counseling/discussion   . Sinus tachycardia 11/06/2017  . Lymphoma ([place]) 11/06/2017  . Lytic bone lesions on xray 11/06/2017  . Pressure injury of skin 10/04/2017  . Dehydration   . Hypoxia   . Pulmonary emboli ([place]) 10/03/2017  . Healthcare-associated pneumonia   . Obstructive uropathy     . Liver function test abnormality   . Renal calculus or stone 09/19/2017  . Leakage of nephrostomy catheter ([place]) 09/19/2017  . Nephrostomy status ([place]) 09/19/2017  . Gastroesophageal reflux disease without esophagitis 09/19/2017  . Hypokalemia 09/18/2017  . Hypomagnesemia 09/18/2017  . Electrolyte abnormality   . Hypovolemia 09/17/2017  . Chemotherapy-induced nausea 09/17/2017  . Constipation 09/17/2017  . 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 Hypocalcemia 09/17/2017  . Severe protein-calorie malnutrition ([place])   . Adjustment disorder with mixed anxiety and depressed mood   . Palliative care encounter   . Admission for antineoplastic chemotherapy   . Septic shock due to Escherichia coli ([place])   . Neutropenia ([place]) 08/21/2017  . Gross hematuria   . Lactic acidosis   . Neutropenic sepsis ([place])   . Hypophosphatemia 08/20/2017  . Diffuse large B cell lymphoma ([place]) 07/03/2017  . Bony metastasis ([place]) 06/29/2017  . Chest wall mass   . 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 Closed compression fracture of L3 lumbar vertebra ([place]) 06/28/2017  . Severe back pain 06/28/2017  . Weight loss, unintentional 06/28/2017    Palliative Care Assessment & Plan   Patient Profile: Cory Ibarra a 56 y.o.malewith medical history significant ofB-cell lymphoma followed by oncology, recent diagnosis of PE, was placed on Eliquis,history of chronic hypokalemia, hypocalcemia, hypomagnesemia, chronic painsyndromewho presents to hospital with initial complaints of gross hematuria through the nephrostomy tube.  He is now s/p PEG placement with episode of vomiting and likely aspiration.   Recommendations/Plan:  Anorexia/cachexia/nutrition: He is hopeful to pursue further chemotherapy and has been restarted on tube feeding despite understanding risks.  Has been tolerating well so far.  Continue scheduled reglan.  Discussed with Dr. Florene Glen.    Will plan to f/u tomorrow.  We  did discuss  yesterday that if he does not tolerate tube feeds, this is likely sign that he is not going to reach a point where he will benefit from further chemotherapy.   Goals of Care and Additional Recommendations:  Limitations on Scope of Treatment: Full Scope Treatment  Code Status:    Code Status Orders  (From admission, onward)        Start     Ordered   10/16/17 1557  Do not attempt resuscitation (DNR)  Continuous    Question Answer Comment  In the event of cardiac or respiratory ARREST Do not call a "code blue"   In the event of cardiac or respiratory ARREST Do not perform Intubation, CPR, defibrillation or ACLS   In the event of cardiac or respiratory ARREST Use medication by any route, position, wound care, and other measures to relive pain and suffering. May use oxygen, suction and manual treatment of airway obstruction as needed for comfort.      10/16/17 1557    Code Status History    Date Active Date Inactive Code Status Order ID Comments User Context   10/03/2017 17:34 10/08/2017 16:55 Partial Code [id number]  Florencia Reasons, MD Inpatient   09/17/2017 17:57 09/25/2017 21:21 Partial Code [id number]  Ardath Sax, MD Inpatient   08/21/2017 20:19 09/03/2017 16:46 Partial Code [id number]  Omar Person, NP Inpatient   08/21/2017 17:18 08/21/2017 20:19 Full Code [id number]  Doreatha Lew, MD Inpatient   06/28/2017 14:21 07/03/2017 19:43 Full Code [id number]  Phillips Grout, MD ED    Advance Directive Documentation     Most Recent Value  Type of Advance Directive  Healthcare Power of Attorney  Pre-existing out of facility DNR order (yellow form or pink MOST form)  No data  "MOST" Form in Place?  No data       Prognosis:   Unable to determine  Discharge Planning:  To Be Determined  Care plan was discussed with patient, bedside RN  Thank you for allowing the Palliative Medicine Team to assist in the care of this patient.   Total Time 20 Prolonged Time Billed No       Greater than 50%  of this time was spent counseling and coordinating care related to the above assessment and plan.  Micheline Rough, MD  Please contact Palliative Medicine Team phone at [phone] for questions and concerns.

## 2017-11-08 NOTE — Progress Notes (Signed)
Date: November 08, 2017 Rhonda Davis, BSN, RN3, CCM [phone] Chart and notes review for patient progress and needs. Will follow for case management and discharge needs. Next review date: [id number] 

## 2017-11-09 ENCOUNTER — Inpatient Hospital Stay (HOSPITAL_COMMUNITY): Payer: Medicaid Other

## 2017-11-09 ENCOUNTER — Encounter (HOSPITAL_COMMUNITY): Payer: Self-pay | Admitting: Diagnostic Radiology

## 2017-11-09 HISTORY — PX: IR GASTR TUBE CONVERT GASTR-JEJ PER W/FL MOD SED: IMG2332

## 2017-11-09 LAB — GLUCOSE, CAPILLARY
GLUCOSE-CAPILLARY: 109 mg/dL — AB (ref 65–99)
GLUCOSE-CAPILLARY: 95 mg/dL (ref 65–99)
GLUCOSE-CAPILLARY: 87 mg/dL (ref 65–99)
GLUCOSE-CAPILLARY: 104 mg/dL — AB (ref 65–99)
Glucose-Capillary: 95 mg/dL (ref 65–99)
Glucose-Capillary: 96 mg/dL (ref 65–99)

## 2017-11-09 LAB — CBC
HEMATOCRIT: 27.5 % — AB (ref 39.0–52.0)
Hemoglobin: 8.8 g/dL — ABNORMAL LOW (ref 13.0–17.0)
MCH: 26.9 pg (ref 26.0–34.0)
MCHC: 32 g/dL (ref 30.0–36.0)
MCV: 84.1 fL (ref 78.0–100.0)
Platelets: 150 10*3/uL (ref 150–400)
RBC: 3.27 MIL/uL — ABNORMAL LOW (ref 4.22–5.81)
RDW: 18.2 % — AB (ref 11.5–15.5)
WBC: 7.4 10*3/uL (ref 4.0–10.5)

## 2017-11-09 LAB — MAGNESIUM: Magnesium: 1.8 mg/dL (ref 1.7–2.4)

## 2017-11-09 LAB — COMPREHENSIVE METABOLIC PANEL
ALT: 19 U/L (ref 17–63)
AST: 22 U/L (ref 15–41)
Albumin: 2.2 g/dL — ABNORMAL LOW (ref 3.5–5.0)
Alkaline Phosphatase: 111 U/L (ref 38–126)
Anion gap: 7 (ref 5–15)
BUN: 5 mg/dL — AB (ref 6–20)
CALCIUM: 7.5 mg/dL — AB (ref 8.9–10.3)
CHLORIDE: 103 mmol/L (ref 101–111)
CO2: 28 mmol/L (ref 22–32)
GLUCOSE: 113 mg/dL — AB (ref 65–99)
Potassium: 3.6 mmol/L (ref 3.5–5.1)
Sodium: 138 mmol/L (ref 135–145)
Total Bilirubin: 0.5 mg/dL (ref 0.3–1.2)
Total Protein: 4.6 g/dL — ABNORMAL LOW (ref 6.5–8.1)

## 2017-11-09 LAB — PHOSPHORUS: PHOSPHORUS: 2.4 mg/dL — AB (ref 2.5–4.6)

## 2017-11-09 MED ORDER — IOPAMIDOL (ISOVUE-300) INJECTION 61%
50.0000 mL | Freq: Once | INTRAVENOUS | Status: AC | PRN
  Administered 2017-11-09: 20 mL

## 2017-11-09 MED ORDER — IOPAMIDOL (ISOVUE-300) INJECTION 61%
INTRAVENOUS | Status: AC
  Administered 2017-11-09: 20 mL
  Filled 2017-11-09: qty 50

## 2017-11-09 MED ORDER — MUPIROCIN 2 % EX OINT
1.0000 "application " | TOPICAL_OINTMENT | Freq: Two times a day (BID) | CUTANEOUS | Status: AC
  Administered 2017-11-09 – 2017-11-13 (×10): 1 via NASAL
  Filled 2017-11-09: qty 22

## 2017-11-09 MED ORDER — CHLORHEXIDINE GLUCONATE CLOTH 2 % EX PADS
6.0000 | MEDICATED_PAD | Freq: Every day | CUTANEOUS | Status: AC
  Administered 2017-11-09 – 2017-11-13 (×3): 6 via TOPICAL

## 2017-11-09 NOTE — Progress Notes (Addendum)
PROGRESS NOTE    Cory Ibarra  QQV:[id number] DOB: 01-19-1961 DOA: 10/16/2017 PCP: [name], [name]    Brief Narrative:  Cory Ibarra is an 56 y.o. male past medical history recently diagnosed, with restaging CT scan of the neck chest abdomen and pelvis demonstrating positive response to chemotherapy, status post 2 cycles of therapy, underwent nephrostomy tube placement on the right due to stone, recent apixaban presents to the ED with hematuria from his nephrostomy tube to respond to resolve multiple lites imbalance and poor appetite, he was also during the workup in the ED found to be tachycardic.  His tachycardia resolved spontaneously his electrolytes were repleted and resolved. Interventional was consulted due to poor appetite.  Due to low poor appetite and ongoing weight loss interventional radiologist was consulted PEG tube was placed, and he has been progressive.  Due to significant back pain neurosurgery was consulted and CT scan of the neck was done that showed lytic lesion in C1-C4 with the progression of the pathologic fracture, neurosurgery deemed patient not a surgical candidate  Foley catheter was placed in the ED, Foley catheter, went reinserted patient started having bleeding through his urethra which is now clearing after holding Eliquis.  On 11/06/2017 large amount controlled tube feedings, 10 or 20 minutes after this he started vomiting.  Tachycardic, hypoxic and hypotensive.   Assessment & Plan:   Active Problems:   Severe back pain   Weight loss, unintentional   Hypophosphatemia   Gross hematuria   Hypocalcemia   Severe protein-calorie malnutrition (HCC)   Hypokalemia   Hypomagnesemia   Nephrostomy status (HCC)   Pulmonary emboli (HCC)   Sinus tachycardia   Lymphoma (HCC)   Lytic bone lesions on xray   Aspiration pneumonia due to regurgitated food ([place])   Goals of care, counseling/discussion  New aspiration pneumonia: Tachycardia and BP improved Unasyn  ordered from 12/4-12/8 (5 day course) Continue IVF Now on RA  Will resume tube feedings, allow some PO as well Palliative started reglan  conversion of g tube to j tube by  IR on 12/7  Hypocalcemia/hypokalemia/hypophosphatemia: Electrolytes have been supplemented and monitored. Continues supplement and monitored regularly Encouraged him to take calcium  Sinus tachycardia: Improved, EKG with sinus tach  Depression: Continue Zyprexa.  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 Cannot get denosumab due to persistent hypocalcemia. MRI of the spine showed multiple lytic lesions and compression fraction of L3, neuro surgery was consulted and recommended no intervention as he is not a candidate. Lumbar corset ordered by neurosurgery. Physical therapy was consulted. Management per oncology Dr. [name] (see note from 12/4) planning to resume chemotherapy, but wants pt to have tolerated tube feeds for at least 1 week prior to restarting chemotherapy.  Likely will f/u after tube feeds stable and resume reduced [name] at that time (pt may need to f/u with provider other than Dr. [name] as he'll be out in coming weeks, need to touch base with oncology prior to discharge).  History of PE: Restart eliquis, he notes hematuria is improving after traumatic foley  Gross hematuria/Right urethral stone/obstructive uropathy with Nephrostomy status ([place]): S/p nephrostomy placement in 08/2017, last changed on 11/1 Urology consulted for recommendation of definitive treatment.   Severe protein caloric malnutrition/  Weight loss, unintentional: Will plan to restart tube feeds. Nutrition c/s  DVT prophylaxis: eliquis Code Status: DNR Family Communication: none at  bedise Disposition Plan: pending stabilization, tolerating tube feeds  Consultants:    Urology  PCT  Oncology  Neurosurgery  IR  Procedures: (Don't include imaging studies which can be auto populated. Include things that cannot be auto populated i.e. Echo, Carotid and venous dopplers, Foley, Bipap, HD, tubes/drains, wound vac, central lines etc)  G tube placed 11/30  Antimicrobials: (specify start and planned stop date. Auto populated tables are space occupying and do not give end dates) Anti-infectives (From admission, onward)   Start     Dose/Rate Route Frequency Ordered Stop   11/06/17 1000  ampicillin-sulbactam (UNASYN) 1.5 g in sodium chloride 0.9 % 50 mL IVPB  Status:  Discontinued     1.5 g 100 mL/hr over 30 Minutes Intravenous Every 6 hours 11/06/17 0847 11/06/17 0907   11/06/17 0930  Ampicillin-Sulbactam (UNASYN) 3 g in sodium chloride 0.9 % 100 mL IVPB     3 g 200 mL/hr over 30 Minutes Intravenous Every 6 hours 11/06/17 0907 11/11/17 0929   11/02/17 0830  ceFAZolin (ANCEF) IVPB 2g/100 mL premix     2 g 200 mL/hr over 30 Minutes Intravenous To Surgery 11/02/17 0815 11/02/17 0916   11/02/17 0820  ceFAZolin (ANCEF) 2-4 GM/100ML-% IVPB    Comments:  [name]   : cabinet override      11/02/17 0820 11/02/17 0839   11/02/17 0815  ceFAZolin (ANCEF) powder 2 g  Status:  Discontinued     2 g Other To Surgery 11/02/17 0813 11/02/17 0815   11/02/17 0600  clindamycin (CLEOCIN) IVPB 900 mg  Status:  Discontinued     900 mg 100 mL/hr over 30 Minutes Intravenous 30 min pre-op 10/31/17 1415 11/06/17 0836   10/17/17 1000  sulfamethoxazole-trimethoprim (BACTRIM,SEPTRA) 200-40 MG/5ML suspension 20 mL  Status:  Discontinued    Comments:  Continuous     20 mL Oral Every M-W-F 10/16/17 1558 10/17/17 0936   10/17/17 1000  sulfamethoxazole-trimethoprim (BACTRIM,SEPTRA) 400-80 MG per tablet 2 tablet     2 tablet Oral Once per day on Mon Wed Fri 10/17/17 0936          Subjective: No complaints. Notes chronic back pain. No CP or SOB.  Objective: Vitals:    11/08/17 0800 11/08/17 1400 11/08/17 2014 11/09/17 0417  BP: 112/72 113/64 104/77 108/62  Pulse: 98 (!) 112 100 (!) 102  Resp: 20 20 20 18   Temp: 98.2 F (36.8 C) 98 F (36.7 C) 97.6 F (36.4 C) 97.6 F (36.4 C)  TempSrc: Oral Oral Oral Oral  SpO2: 100% 100% 100% 100%  Weight: 58 kg (127 lb 13.9 oz)     Height:        Intake/Output Summary (Last 24 hours) at 11/09/2017 0855 Last data filed at 11/09/2017 0600 Gross per 24 hour  Intake 2018 ml  Output 1775 ml  Net 243 ml   Filed Weights   10/27/17 2202 11/07/17 0500 11/08/17 0800  Weight: 58.7 kg (129 lb 6.6 oz) 57.6 kg (126 lb 15.8 oz) 58 kg (127 lb 13.9 oz)    Examination:  General: No acute distress.  Cachetic Cardiovascular: Heart sounds show a regular rate, and rhythm. No gallops or rubs. No murmurs. No JVD. Lungs: Clear to auscultation bilaterally with good air movement. No rales, rhonchi or wheezes. Abdomen: Soft, nontender, nondistended with normal active bowel sounds. No masses. No hepatosplenomegaly.  R nephrostomy tube. Neurological: Alert and oriented 3. Moves all extremities 4 with equal strength. Cranial nerves II through XII grossly intact.  Skin: Warm and dry. No rashes or lesions. Extremities: No clubbing or cyanosis. No edema.  Psychiatric: Mood and affect are normal. Insight and judgment are appropriate.   Data Reviewed: I have personally reviewed following labs and imaging studies  CBC: Recent Labs  Lab 11/06/17 0537 11/07/17 1041 11/08/17 0416 11/09/17 0441  WBC 16.1* 11.8* 10.3 7.4  HGB 8.7* 8.7* 8.5* 8.8*  HCT 27.3* 26.8* 27.1* 27.5*  MCV 84.5 84.5 84.4 84.1  PLT 194 138* 151 756   Basic Metabolic Panel: Recent Labs  Lab 11/05/17 0313 11/06/17 0456 11/07/17 0354 11/07/17 1041 11/08/17 0416 11/09/17 0441  NA 133* 133*  --  136 136 138  K 3.7 3.7  --  3.6 3.6 3.6  CL 101 101  --  106 105 103  CO2 29 23  --  24 25 28   GLUCOSE 122* 153*  --  84 78 113*  BUN 8 12  --  12 10 5*   CREATININE <0.30* 0.53*  --  <0.30* <0.30* <0.30*  CALCIUM 7.0* 7.4*  --  6.8* 6.9* 7.5*  MG 2.0 1.8 1.9  --  1.9 1.8  PHOS 2.5 1.5* 2.2*  --  1.9* 2.4*   GFR: CrCl cannot be calculated (This lab value cannot be used to calculate CrCl because it is not a number: <0.30). Liver Function Tests: Recent Labs  Lab 11/07/17 1431 11/08/17 0416 11/09/17 0441  AST  --  23 22  ALT  --  18 19  ALKPHOS  --  104 111  BILITOT  --  0.8 0.5  PROT  --  4.4* 4.6*  ALBUMIN 2.0* 1.9* 2.2*   No results for input(s): LIPASE, AMYLASE in the last 168 hours. No results for input(s): AMMONIA in the last 168 hours. Coagulation Profile: No results for input(s): INR, PROTIME in the last 168 hours. Cardiac Enzymes: No results for input(s): CKTOTAL, CKMB, CKMBINDEX, TROPONINI in the last 168 hours. BNP (last 3 results) No results for input(s): PROBNP in the last 8760 hours. HbA1C: No results for input(s): HGBA1C in the last 72 hours. CBG: Recent Labs  Lab 11/08/17 1630 11/08/17 2017 11/08/17 2359 11/09/17 0423 11/09/17 0815  GLUCAP 184* 151* 112* 109* 95   Lipid Profile: No results for input(s): CHOL, HDL, LDLCALC, TRIG, CHOLHDL, LDLDIRECT in the last 72 hours. Thyroid Function Tests: No results for input(s): TSH, T4TOTAL, FREET4, T3FREE, THYROIDAB in the last 72 hours. Anemia Panel: No results for input(s): VITAMINB12, FOLATE, FERRITIN, TIBC, IRON, RETICCTPCT in the last 72 hours. Sepsis Labs: Recent Labs  Lab 11/07/17 1041 11/07/17 1432  LATICACIDVEN 0.7 0.8    Recent Results (from the past 240 hour(s))  MRSA PCR Screening     Status: Abnormal   Collection Time: 11/06/17 11:26 AM  Result Value Ref Range Status   MRSA by PCR POSITIVE (A) NEGATIVE Final    Comment:        The GeneXpert MRSA Assay (FDA approved for NASAL specimens only), is one component of a comprehensive MRSA colonization surveillance program. It is not intended to diagnose MRSA infection nor to guide or monitor  treatment for MRSA infections. RESULT CALLED TO, READ BACK BY AND VERIFIED WITH: KAUR,B. RN @1412  ON 12.04.18 BY COHEN,K   Culture, Urine     Status: Abnormal   Collection Time: 11/07/17 10:30 AM  Result Value Ref Range Status   Specimen Description URINE, CLEAN CATCH  Final   Special Requests NONE  Final   Culture (A)  Final    <  10,000 COLONIES/mL INSIGNIFICANT GROWTH Performed at [facility], [place] [address], [place], [place] [address]    Report Status 11/08/2017 FINAL  Final         Radiology Studies: No results found.      Scheduled Meds: . apixaban  5 mg Oral BID  . calcium carbonate  1,000 mg Oral TID  . Chlorhexidine Gluconate Cloth  6 each Topical Q0600  . feeding supplement (PRO-STAT SUGAR FREE 64)  30 mL Per Tube Daily  . magnesium oxide  400 mg Oral BID  . metoCLOPramide (REGLAN) injection  10 mg Intravenous Q6H   Or  . metoCLOPramide  10 mg Oral Q6H  . metoprolol tartrate  12.5 mg Oral BID  . multivitamin  15 mL Oral Daily  . mupirocin ointment  1 application Nasal BID  . OLANZapine zydis  5 mg Oral QHS  . oxyCODONE  10 mg Oral Q12H  . pantoprazole  40 mg Oral Q lunch  . potassium & sodium phosphates  1 packet Oral TID AC & HS  . predniSONE  20 mg Oral Q breakfast  . sulfamethoxazole-trimethoprim  2 tablet Oral Once per day on Mon Wed Fri  . Vitamin D (Ergocalciferol)  50,000 Units Oral Q Mon   Continuous Infusions: . ampicillin-sulbactam (UNASYN) IV 3 g (11/09/17 0330)  . feeding supplement (OSMOLITE 1.5 CAL) 1,000 mL (11/08/17 2352)     LOS: 24 days    Time spent: over 35 minutes  Case discussed with palliative care and urology  Florencia Reasons, MD PhD [facility] Pager [phone]  If 7PM-7AM, please contact night-coverage [email or web address] Password [email or web address] 11/09/2017, 8:55 AM

## 2017-11-09 NOTE — Progress Notes (Signed)
Physical Therapy Treatment Patient Details Name: Cory Ibarra MRN: [id number] DOB: 04-14-1961 Today's Date: 11/09/2017    History of Present Illness 56 year old male with recently diagnosed lymphoma, s/p 2 cycles of chemo and underwent XRT, right ureteral stone and s/p nephrostomy 10/04/17, recent PE and now on apixaban. He present to ED with hematuria through nephrostomy tube. Hematuria spontaneously resolved. He was however found to have multiple electrolyte derangements. In addition, his nutritional status is very poor. Compression deformities at C4, T10, T12, L3.     PT Comments    Pt reported he was in too much pain to attempt supine to sit, he agreed to BLE bed exercises. Performed exercises with min assist. Pt had significant increase in low back pain with attempted movement of LLE. Encouraged pt to continue working on isometric LE exercises and on gradually raising head of bed to minimize orthostatic hypotension.  Significant muscle atrophy noted BUE/LEs.    Follow Up Recommendations  SNF;Supervision/Assistance - 24 hour     Equipment Recommendations  None recommended by PT    Recommendations for Other Services       Precautions / Restrictions Precautions Precautions: Fall Precaution Comments: dizzy with sitting Restrictions Weight Bearing Restrictions: No    Mobility  Bed Mobility               General bed mobility comments: not attempted, pt in pain with minimal movement of LEs  Transfers                    Ambulation/Gait       Gait Pattern/deviations: Narrow base of support         Stairs            Wheelchair Mobility    Modified Rankin (Stroke Patients Only)       Balance                                            Cognition                                              Exercises General Exercises - Upper Extremity Shoulder Flexion: AROM;Both;10 reps;Supine General Exercises - Lower  Extremity Ankle Circles/Pumps: AROM;Both;15 reps;Seated Quad Sets: AROM;Both;5 reps;Supine Gluteal Sets: AROM;Both;5 reps;Supine Hip ABduction/ADduction: AAROM;Right;10 reps;Supine Straight Leg Raises: AAROM;Right;10 reps(pain in low back when attempted with LLE)    General Comments        Pertinent Vitals/Pain Pain Score: 7  Pain Location: low back with LLE exercises, 4/10 at rest prior to exercises Pain Descriptors / Indicators: Sore Pain Intervention(s): Limited activity within patient's tolerance;Monitored during session;Repositioned;Patient requesting pain meds-RN notified    Home Living                      Prior Function            PT Goals (current goals can now be found in the care plan section) Acute Rehab PT Goals Patient Stated Goal: to walk, go home, shop at antique stores PT Goal Formulation: With patient Time For Goal Achievement: 11/14/17 Potential to Achieve Goals: Fair Progress towards PT goals: Not progressing toward goals - comment(pain limiting progress)    Frequency    Min  3X/week      PT Plan Current plan remains appropriate    Co-evaluation              AM-PAC PT "6 Clicks" Daily Activity  Outcome Measure  Difficulty turning over in bed (including adjusting bedclothes, sheets and blankets)?: Unable Difficulty moving from lying on back to sitting on the side of the bed? : Unable Difficulty sitting down on and standing up from a chair with arms (e.g., wheelchair, bedside commode, etc,.)?: Unable Help needed moving to and from a bed to chair (including a wheelchair)?: Total Help needed walking in hospital room?: Total Help needed climbing 3-5 steps with a railing? : Total 6 Click Score: 6    End of Session   Activity Tolerance: Patient limited by fatigue;Patient limited by pain(elevated HR, dizziness) Patient left: with call bell/phone within reach;in bed Nurse Communication: Mobility status PT Visit Diagnosis: Muscle  weakness (generalized) (M62.81);Difficulty in walking, not elsewhere classified (R26.2);Dizziness and giddiness (R42)     Time: 9741-6384 PT Time Calculation (min) (ACUTE ONLY): 11 min  Charges:  $Therapeutic Exercise: 8-22 mins                    G Codes:          Philomena Doheny 11/09/2017, 9:51 AM [phone]

## 2017-11-09 NOTE — Progress Notes (Signed)
Daily Progress Note   Patient Name: Cory Ibarra       Date: 11/09/2017 DOB: 1961-11-10  Age: 56 y.o. MRN#: [id number] Attending Physician: Florencia Reasons, MD Primary Care Physician: [name], [name] Admit Date: 10/16/2017  Reason for Consultation/Follow-up: Establishing goals of care and Non pain symptom management  Subjective: I met with Cory Ibarra today.  He reports feeling "much better" today and has been tolerating restarting of tube feeds without complications.  He reports wanting to continue with advancing tube feeds as planned.  Length of Stay: 24  Current Medications: Scheduled Meds:  . iopamidol      . apixaban  5 mg Oral BID  . calcium carbonate  1,000 mg Oral TID  . Chlorhexidine Gluconate Cloth  6 each Topical Q0600  . feeding supplement (PRO-STAT SUGAR FREE 64)  30 mL Per Tube Daily  . magnesium oxide  400 mg Oral BID  . metoCLOPramide (REGLAN) injection  10 mg Intravenous Q6H   Or  . metoCLOPramide  10 mg Oral Q6H  . metoprolol tartrate  12.5 mg Oral BID  . multivitamin  15 mL Oral Daily  . mupirocin ointment  1 application Nasal BID  . OLANZapine zydis  5 mg Oral QHS  . oxyCODONE  10 mg Oral Q12H  . pantoprazole  40 mg Oral Q lunch  . potassium & sodium phosphates  1 packet Oral TID AC & HS  . predniSONE  20 mg Oral Q breakfast  . sulfamethoxazole-trimethoprim  2 tablet Oral Once per day on Mon Wed Fri  . Vitamin D (Ergocalciferol)  50,000 Units Oral Q Mon    Continuous Infusions: . ampicillin-sulbactam (UNASYN) IV 3 g (11/09/17 1446)  . feeding supplement (OSMOLITE 1.5 CAL) 1,000 mL (11/08/17 2352)    PRN Meds: acetaminophen **OR** acetaminophen, iopamidol, labetalol, LORazepam, metoprolol tartrate, oxyCODONE, sodium chloride flush  Physical Exam       General: Alert, awake, in no acute distress. Lying in bed.  Pleasant and cooperative but somewhat flat affect.   HEENT: No bruits, no goiter, no JVD.  Heart: Tachycardic. No murmur appreciated. Lungs: Good air movement, clear Abdomen: Soft, nontender, nondistended, positive bowel sounds.  Ext: No significant edema Skin: Warm and dry Neuro: Grossly intact, nonfocal.       Vital Signs: BP 106/70 (BP Location: Right  Arm)   Pulse (!) 102   Temp 98.1 F (36.7 C) (Oral)   Resp (!) 22   Ht _0  (1.88 m)   Wt 58 kg (127 lb 13.9 oz)   SpO2 96%   BMI 16.42 kg/m  SpO2: SpO2: 96 % O2 Device: O2 Device: Not Delivered O2 Flow Rate: O2 Flow Rate (L/min): 1 L/min  Intake/output summary:   Intake/Output Summary (Last 24 hours) at 11/09/2017 1606 Last data filed at 11/09/2017 1500 Gross per 24 hour  Intake 1591.33 ml  Output 1725 ml  Net -133.67 ml   LBM: Last BM Date: 11/08/17 Baseline Weight: Weight: 60.3 kg (132 lb 15 oz) Most recent weight: Weight: 58 kg (127 lb 13.9 oz)       Palliative Assessment/Data:    Flowsheet Rows     Most Recent Value  Intake Tab  Referral Department  Hospitalist  Unit at Time of Referral  Med/Surg Unit  Palliative Care Primary Diagnosis  Cancer  Date Notified  10/17/17  Palliative Care Type  Return patient Palliative Care  Reason for referral  Clarify Goals of Care  Date of Admission  10/16/17  Date first seen by Palliative Care  10/17/17  # of days Palliative referral response time  0 Day(s)  # of days IP prior to Palliative referral  1  Clinical Assessment  Psychosocial & Spiritual Assessment  Palliative Care Outcomes      Patient Active Problem List   Diagnosis Date Noted  . Aspiration pneumonia due to regurgitated food ([place])   . Goals of care, counseling/discussion   . Sinus tachycardia 11/06/2017  . Lymphoma ([place]) 11/06/2017  . Lytic bone lesions on xray 11/06/2017  . Pressure injury of skin 10/04/2017  . Dehydration   .  Hypoxia   . Pulmonary emboli ([place]) 10/03/2017  . Healthcare-associated pneumonia   . Obstructive uropathy   . Liver function test abnormality   . Renal calculus or stone 09/19/2017  . Leakage of nephrostomy catheter ([place]) 09/19/2017  . Nephrostomy status ([place]) 09/19/2017  . Gastroesophageal reflux disease without esophagitis 09/19/2017  . Hypokalemia 09/18/2017  . Hypomagnesemia 09/18/2017  . Electrolyte abnormality   . Hypovolemia 09/17/2017  . Chemotherapy-induced nausea 09/17/2017  . Constipation 09/17/2017  . 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 Hypocalcemia 09/17/2017  . Severe protein-calorie malnutrition ([place])   . Adjustment disorder with mixed anxiety and depressed mood   . Palliative care encounter   . Admission for antineoplastic chemotherapy   . Septic shock due to Escherichia coli ([place])   . Neutropenia ([place]) 08/21/2017  . Gross hematuria   . Lactic acidosis   . Neutropenic sepsis ([place])   . Hypophosphatemia 08/20/2017  . Diffuse large B cell lymphoma ([place]) 07/03/2017  . Bony metastasis ([place]) 06/29/2017  . Chest wall mass   . 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 Closed compression fracture of L3 lumbar vertebra ([place]) 06/28/2017  . Severe back pain 06/28/2017  . Weight loss, unintentional 06/28/2017    Palliative Care Assessment & Plan   Patient Profile: Cory Ibarra a 56 y.o.malewith medical history significant ofB-cell lymphoma followed by oncology, recent diagnosis of PE, was placed on Eliquis,history of chronic hypokalemia, hypocalcemia, hypomagnesemia, chronic painsyndromewho presents to hospital with initial complaints of gross hematuria through the nephrostomy tube.  He is now s/p PEG placement with episode of vomiting and likely aspiration.   Recommendations/Plan:  Anorexia/cachexia/nutrition:  Has been tolerating restart of tube feeds well so far.  It is to  advance to J-tube today.  Continue scheduled reglan.  Discussed with Dr.  Erlinda Hong.    He has my card and will call if any needs over the weekend.  I will plan on checking on him again on Monday.  Goals of Care and Additional Recommendations:  Limitations on Scope of Treatment: Full Scope Treatment  Code Status:    Code Status Orders  (From admission, onward)        Start     Ordered   10/16/17 1557  Do not attempt resuscitation (DNR)  Continuous    Question Answer Comment  In the event of cardiac or respiratory ARREST Do not call a "code blue"   In the event of cardiac or respiratory ARREST Do not perform Intubation, CPR, defibrillation or ACLS   In the event of cardiac or respiratory ARREST Use medication by any route, position, wound care, and other measures to relive pain and suffering. May use oxygen, suction and manual treatment of airway obstruction as needed for comfort.      10/16/17 1557    Code Status History    Date Active Date Inactive Code Status Order ID Comments User Context   10/03/2017 17:34 10/08/2017 16:55 Partial Code [id number]  Florencia Reasons, MD Inpatient   09/17/2017 17:57 09/25/2017 21:21 Partial Code [id number]  Ardath Sax, MD Inpatient   08/21/2017 20:19 09/03/2017 16:46 Partial Code [id number]  Omar Person, NP Inpatient   08/21/2017 17:18 08/21/2017 20:19 Full Code [id number]  Doreatha Lew, MD Inpatient   06/28/2017 14:21 07/03/2017 19:43 Full Code [id number]  Phillips Grout, MD ED    Advance Directive Documentation     Most Recent Value  Type of Advance Directive  Healthcare Power of Attorney  Pre-existing out of facility DNR order (yellow form or pink MOST form)  No data  "MOST" Form in Place?  No data       Prognosis:   Unable to determine  Discharge Planning:  To Be Determined  Care plan was discussed with patient, bedside RN  Thank you for allowing the Palliative Medicine Team to assist in the care of this patient.   Total Time 20 Prolonged Time Billed No      Greater than 50%  of this time was  spent counseling and coordinating care related to the above assessment and plan.  Micheline Rough, MD  Please contact Palliative Medicine Team phone at [phone] for questions and concerns.

## 2017-11-09 NOTE — Consult Note (Signed)
Urology Consult  Consulting MD:Dr Dillard Cannon  CC: Kidney stone  HPI: This is a 55year old male who has an indwelling right nephrostomy tube which was used to decompress his right kidney following presentation with an obstructing, impacted right ureteral stone.  This was initially placed on 08/21/2017, and changed on 10/04/2017.  The patient has significant medical issues currently, and is in between rounds of chemotherapy for lymphoma.  He is not having any current issues from the nephrostomy tube.  Because of a window of opportunity for treatment of the stone, urologic consultation is requested.  PMH: Past Medical History:  Diagnosis Date  . Cancer ([place])   . Tendonitis     PSH: Past Surgical History:  Procedure Laterality Date  . CYSTOSCOPY W/ URETERAL STENT PLACEMENT Right 08/21/2017   Procedure: CYSTOSCOPY WITH RETROGRADE PYELOGRAM, RIGHT;  Surgeon: Alexis Frock, MD;  Location: [facility];  Service: Urology;  Laterality: Right;  . HERNIA REPAIR  1988   Dr. Vida Rigger  . IR FLUORO GUIDE PORT INSERTION RIGHT  07/26/2017  . IR GASTR TUBE CONVERT GASTR-JEJ PER W/FL MOD SED  11/09/2017  . IR GASTROSTOMY TUBE MOD SED  11/02/2017  . IR NEPHROSTOMY EXCHANGE RIGHT  10/04/2017  . IR NEPHROSTOMY PLACEMENT RIGHT  08/21/2017  . IR US GUIDE VASC ACCESS RIGHT  07/26/2017    Allergies: Allergies  Allergen Reactions  . Bee Venom Swelling    Medications: Medications Prior to Admission  Medication Sig Dispense Refill Last Dose  . apixaban (ELIQUIS) 5 MG TABS tablet Take 2 tabs po BID x7 days, then take 1 tab po BID 90 tablet 0 10/15/2017 at 0900  . calcium elemental as carbonate (BARIATRIC TUMS ULTRA) 400 MG chewable tablet Chew 3 tablets (1,200 mg total) 3 (three) times daily by mouth.   10/15/2017 at 2100  . chlorhexidine (PERIDEX) 0.12 % solution 15 mLs by Mouth Rinse route 2 (two) times daily. 120 mL 0 10/15/2017 at 2200  . Cholecalciferol 50000 units TABS Take 50,000 Units once a week by mouth. for 8  weeks, then return for level recheck (Patient taking differently: Take 50,000 Units every Monday by mouth. for 8 weeks, then return for level recheck) 8 tablet 0 10/15/2017 at 0900  . dronabinol (MARINOL) 5 MG capsule Take 1 capsule (5 mg total) daily before lunch by mouth. 30 capsule 0 10/15/2017 at 1200  . Lidocaine-Prilocaine, Bulk, 2.5-2.5 % CREA Apply to affected areas topically one time daily   10/15/2017 at 1000  . [EXPIRED] LORazepam (ATIVAN) 0.5 MG tablet Take 1 tablet (0.5 mg total) every 6 (six) hours as needed for up to 14 days by mouth (Nausea or vomiting). 60 tablet 0 unknown  . [EXPIRED] magnesium oxide (MAG-OX) 400 (241.3 Mg) MG tablet Take 1 tablet (400 mg total) by mouth 2 (two) times daily. 60 tablet 0 10/15/2017 at 2200  . Multiple Vitamin (MULTIVITAMIN WITH MINERALS) TABS tablet Take 1 tablet by mouth daily.   10/15/2017 at 1000  . OLANZapine zydis (ZYPREXA) 5 MG disintegrating tablet Take 0.5 tablets (2.5 mg total) by mouth at bedtime. 30 tablet 0 10/15/2017 at 2100  . oxyCODONE (OXYCONTIN) 10 mg 12 hr tablet Take 1 tablet (10 mg total) every 12 (twelve) hours by mouth. 60 tablet 0 10/16/2017 at 0600  . Oxycodone HCl 10 MG TABS Take 10 mg every 4 (four) hours as needed by mouth (for pain).   10/13/2017  . pantoprazole (PROTONIX) 40 MG tablet Take 40 mg by mouth daily with lunch.  10/15/2017 at 1200  . polyethylene glycol (MIRALAX / GLYCOLAX) packet Take 17 g daily as needed by mouth for moderate constipation.    unknown  . [EXPIRED] potassium & sodium phosphates (PHOS-NAK) 280-160-250 MG PACK Take 1 packet by mouth 4 (four) times daily -  before meals and at bedtime. 120 packet 0 10/16/2017 at 0630  . predniSONE (DELTASONE) 10 MG tablet Take 2 tablets (20 mg total) daily with breakfast by mouth. 60 tablet 0 10/15/2017 at 0800  . prochlorperazine (COMPAZINE) 10 MG tablet Take 10 mg every 6 (six) hours as needed by mouth for nausea or vomiting.   unknown  . senna-docusate  (SENOKOT-S) 8.6-50 MG tablet Take 1 tablet by mouth daily as needed for mild constipation.   unknown  . sulfamethoxazole-trimethoprim (BACTRIM,SEPTRA) 200-40 MG/5ML suspension Take 20 mLs by mouth every Monday, Wednesday, and Friday. Continuous   10/15/2017 at 0900  . oxyCODONE (ROXICODONE) 5 MG immediate release tablet Take 1 tablet (5 mg total) every 4 (four) hours as needed by mouth for severe pain. (Patient not taking: Reported on 10/16/2017) 100 tablet 0 Not Taking at Unknown time     Social History: Social History   Socioeconomic History  . Marital status: Single    Spouse name: Not on file  . Number of children: 0  . Years of education: Not on file  . Highest education level: Not on file  Social Needs  . Financial resource strain: Not on file  . Food insecurity - worry: Not on file  . Food insecurity - inability: Not on file  . Transportation needs - medical: Not on file  . Transportation needs - non-medical: Not on file  Occupational History  . Not on file  Tobacco Use  . Smoking status: Never Smoker  . Smokeless tobacco: Never Used  Substance and Sexual Activity  . Alcohol use: Yes    Comment: occasionally/socially  . Drug use: No  . Sexual activity: No  Other Topics Concern  . 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 Never used smokeless tobacco.   No e-cigarettes   Occasional alcohol consumption socially.   Single, no children.    Family History: Family History  Problem Relation Age of Onset  . Cancer Father     Review of Systems: Positive: Lower back pain from fracture.  Blood in urine, clearing. Negative:  A further 10 point review of systems was negative except what is listed in the HPI.  Physical Exam: @VITALS2 @ General: No acute distress.  Awake.  Cachectic appearing Head:  Normocephalic.  Atraumatic.  Temporal wasting noted ENT:  EOMI.  Mucous membranes moist Neck:  Supple.   No lymphadenopathy. CV:   Regular rate. Pulmonary: Equal effort bilaterally.   Skin:  Normal turgor.  No visible rash. Extremity: No gross deformity of bilateral upper extremities.  No gross deformity of    bilateral lower extremities. Neurologic: Alert. Appropriate mood.   Studies:  Recent Labs    11/08/17 0416 11/09/17 0441  HGB 8.5* 8.8*  WBC 10.3 7.4  PLT 151 150    Recent Labs    11/08/17 0416 11/09/17 0441  NA 136 138  K 3.6 3.6  CL 105 103  CO2 25 28  BUN 10 5*  CREATININE <0.30* <0.30*  CALCIUM 6.9* 7.5*  GFRNONAA NOT CALCULATED NOT CALCULATED  GFRAA NOT CALCULATED NOT CALCULATED     No results for input(s): INR, APTT in  the last 72 hours.  Invalid input(s): PT   Invalid input(s): ABG  I reviewed the patient's CT scan.  He has an 8 mm proximal right ureteral stone.  Nephrostomy tube is appropriately positioned.  Assessment: Right upper ureteral stone, impacted, being managed at the present time with a percutaneous nephrostomy tube.  The patient does have significant medical issues, cachexia, but treatment is desired by his medical care team, possibly before another cycle of chemotherapy is initiated.  Plan: I discussed management of the stone with the patient.  Therapeutic options include a general anesthetic with percutaneous nephrolithotomy which would be quite invasive but the procedure most likely to rid the patient of the stone with one treatment.  Another procedure would be shockwave lithotripsy which would be done with sedation.  However, the stone is impacted and he would have, at best, a 70% chance of passing his fragments following treatment.  Another option would be general anesthetic with ureteroscopic management.  I think it is worthwhile, especially since the patient does have a percutaneous nephrostomy tube in place and does have some medical issues, and to consider shockwave lithotripsy.  I discussed this with him.  The earliest that this could  be done would be Thursday, 13 December.  We will have plenty of time to consult on this prior to that.    Pager:[phone]

## 2017-11-09 NOTE — Progress Notes (Signed)
Patient came back to unit after IR Gastr. Tube convert gastr- JEJ done at 1730; alert and oriented x4; no pain complained; dressing intact, no bleeding.

## 2017-11-09 NOTE — Procedures (Signed)
Successful conversion of G-tube to GJ tube.   J tube is located in the distal duodenum.  May start tube feeds thru J port.  No immediate complication.  No bleeding.

## 2017-11-10 ENCOUNTER — Inpatient Hospital Stay (HOSPITAL_COMMUNITY): Payer: Medicaid Other

## 2017-11-10 LAB — BASIC METABOLIC PANEL
ANION GAP: 6 (ref 5–15)
BUN: 7 mg/dL (ref 6–20)
CHLORIDE: 105 mmol/L (ref 101–111)
CO2: 27 mmol/L (ref 22–32)
Calcium: 7.2 mg/dL — ABNORMAL LOW (ref 8.9–10.3)
Creatinine, Ser: <0.3 mg/dL — ABNORMAL LOW (ref 0.61–1.24)
GLUCOSE: 93 mg/dL (ref 65–99)
POTASSIUM: 3.6 mmol/L (ref 3.5–5.1)
Sodium: 138 mmol/L (ref 135–145)

## 2017-11-10 LAB — CBC WITH DIFFERENTIAL/PLATELET
BASOS ABS: 0 10*3/uL (ref 0.0–0.1)
Basophils Relative: 0 %
Eosinophils Absolute: 0 10*3/uL (ref 0.0–0.7)
Eosinophils Relative: 1 %
HEMATOCRIT: 26.6 % — AB (ref 39.0–52.0)
HEMOGLOBIN: 8.5 g/dL — AB (ref 13.0–17.0)
LYMPHS PCT: 10 %
Lymphs Abs: 0.4 10*3/uL — ABNORMAL LOW (ref 0.7–4.0)
MCH: 27.1 pg (ref 26.0–34.0)
MCHC: 32 g/dL (ref 30.0–36.0)
MCV: 84.7 fL (ref 78.0–100.0)
Monocytes Absolute: 0.3 10*3/uL (ref 0.1–1.0)
Monocytes Relative: 7 %
NEUTROS ABS: 3.5 10*3/uL (ref 1.7–7.7)
NEUTROS PCT: 82 %
PLATELETS: 150 10*3/uL (ref 150–400)
RBC: 3.14 MIL/uL — AB (ref 4.22–5.81)
RDW: 18.1 % — ABNORMAL HIGH (ref 11.5–15.5)
WBC: 4.3 10*3/uL (ref 4.0–10.5)

## 2017-11-10 LAB — MAGNESIUM: Magnesium: 1.9 mg/dL (ref 1.7–2.4)

## 2017-11-10 LAB — GLUCOSE, CAPILLARY
GLUCOSE-CAPILLARY: 109 mg/dL — AB (ref 65–99)
GLUCOSE-CAPILLARY: 162 mg/dL — AB (ref 65–99)
GLUCOSE-CAPILLARY: 135 mg/dL — AB (ref 65–99)
Glucose-Capillary: 90 mg/dL (ref 65–99)
Glucose-Capillary: 91 mg/dL (ref 65–99)

## 2017-11-10 LAB — ALBUMIN: ALBUMIN: 2.1 g/dL — AB (ref 3.5–5.0)

## 2017-11-10 LAB — PHOSPHORUS: PHOSPHORUS: 2.9 mg/dL (ref 2.5–4.6)

## 2017-11-10 MED ORDER — COLLAGENASE 250 UNIT/GM EX OINT
TOPICAL_OINTMENT | Freq: Every day | CUTANEOUS | Status: DC
  Administered 2017-11-12 – 2017-11-14 (×3): via TOPICAL
  Filled 2017-11-10 (×2): qty 90

## 2017-11-10 MED ORDER — DOXYCYCLINE CALCIUM 50 MG/5ML PO SYRP: 100.0000 mg | ORAL_SOLUTION | Freq: Two times a day (BID) | ORAL | Status: DC

## 2017-11-10 MED ORDER — DOXYCYCLINE HYCLATE 100 MG PO TABS
100.0000 mg | ORAL_TABLET | Freq: Two times a day (BID) | ORAL | Status: DC
  Administered 2017-11-10 – 2017-11-14 (×9): 100 mg via ORAL
  Filled 2017-11-10 (×9): qty 1

## 2017-11-10 NOTE — Consult Note (Addendum)
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 Patient states he incurred this ulcer in Rehab and it is "not bad". Measurement:3cm x 4.5cm with depth unable to be determined due to the presence of white nonviable slough obscuring.  Smaller area on the right IT is covered with white nonviable tissue. Wound bed:See above Drainage (amount, consistency, odor) small to moderate amount of yellow to light green exudate. Periwound:erythematous with evidence of previous wound healing. Dressing procedure/placement/frequency: Patient refusing air mattress.  Taught to lower HOB to less than a 30 degree angle to decrease friction and shear forces in the sacral area.  Patient declines. He is reminded to turn from side to side rather than sitting in a high Fowler's position to provide sacral ulcer with an opportunity to recover. He states that he will consider that, but notes that it is not his preference. Patient relays his chronic, incurable condition and states that he is "trying to get help so that he can go home". I have provided Nursing with guidance in the topical care of the wounds, specifically for the sacral Unstageable pressure injury using collagenase (Santyl).  [facility] nursing team will not follow, but will remain available to this patient, the nursing and medical teams.  Please re-consult if needed. Thanks, Maudie Flakes, MSN, RN, [place], [facility]  Pager# [phone]

## 2017-11-10 NOTE — Progress Notes (Signed)
Subjective: Patient reports he is voiding well  Objective: Vital signs in last 24 hours: Temp:  [97.6 F (36.4 C)-98.1 F (36.7 C)] 97.6 F (36.4 C) (12/08 0500) Pulse Rate:  [81-102] 81 (12/08 0500) Resp:  [18-22] 18 (12/08 0500) BP: (101-108)/(63-70) 101/67 (12/08 0500) SpO2:  [96 %-100 %] 100 % (12/08 0500) Weight:  [56.5 kg (124 lb 9 oz)-57 kg (125 lb 10.6 oz)] 56.5 kg (124 lb 9 oz) (12/08 0500)  Intake/Output from previous day: 12/07 0701 - 12/08 0700 In: 1210.2 [I.V.:10; NG/GT:500.2; IV Piggyback:400] Out: 1075 [Urine:775; Drains:300] Intake/Output this shift: No intake/output data recorded.  Physical Exam:  Constitutional: Vital signs reviewed. WD WN in NAD   Eyes: PERRL, No scleral icterus.     Lab Results: Recent Labs    11/08/17 0416 11/09/17 0441 11/10/17 0335  HGB 8.5* 8.8* 8.5*  HCT 27.1* 27.5* 26.6*   BMET Recent Labs    11/09/17 0441 11/10/17 0335  NA 138 138  K 3.6 3.6  CL 103 105  CO2 28 27  GLUCOSE 113* 93  BUN 5* 7  CREATININE <0.30* <0.30*  CALCIUM 7.5* 7.2*   No results for input(s): LABPT, INR in the last 72 hours. No results for input(s): LABURIN in the last 72 hours. Results for orders placed or performed during the hospital encounter of 10/16/17  C difficile quick scan w PCR reflex     Status: None   Collection Time: 10/17/17  6:05 PM  Result Value Ref Range Status   C Diff antigen NEGATIVE NEGATIVE Final   C Diff toxin NEGATIVE NEGATIVE Final   C Diff interpretation No C. difficile detected.  Final  MRSA PCR Screening     Status: Abnormal   Collection Time: 11/06/17 11:26 AM  Result Value Ref Range Status   MRSA by PCR POSITIVE (A) NEGATIVE Final    Comment:        The GeneXpert MRSA Assay (FDA approved for NASAL specimens only), is one component of a comprehensive MRSA colonization surveillance program. It is not intended to diagnose MRSA infection nor to guide or monitor treatment for MRSA infections. RESULT  CALLED TO, READ BACK BY AND VERIFIED WITH: KAUR,B. RN @1412  ON 12.04.18 BY COHEN,K   Culture, Urine     Status: Abnormal   Collection Time: 11/07/17 10:30 AM  Result Value Ref Range Status   Specimen Description URINE, CLEAN CATCH  Final   Special Requests NONE  Final   Culture (A)  Final    <10,000 COLONIES/mL INSIGNIFICANT GROWTH Performed at [facility], [address], [place], [place] [address]    Report Status 11/08/2017 FINAL  Final    Studies/Results: Ir Chancy [place] Darius Bump Per W/fl Mod Sed  Result Date: 11/09/2017 INDICATION: 56 year old with malnutrition. Gastrostomy tube was placed on 11/02/2017. Patient had an aspiration episode and needs conversion to a GJ tube. EXAM: CONVERSION OF GASTROSTOMY TUBE TO GJ TUBE WITH FLUOROSCOPY MEDICATIONS: None ANESTHESIA/SEDATION: None CONTRAST:  20 mL Isovue-300 FLUOROSCOPY TIME:  Fluoroscopy Time: 1 minutes and 54 seconds, 10 mGy COMPLICATIONS: None immediate. PROCEDURE: Patient was placed supine on the interventional table. The gastrostomy tube and surrounding skin were prepped and draped in sterile fashion. Maximal barrier sterile technique was utilized including caps, mask, sterile gowns, sterile gloves, sterile drape, hand hygiene and skin antiseptic. The existing G-tube was cut short. A 5 French Cobra catheter was advanced through the 20 [place] gastrostomy tube into the stomach. Contrast was injected into the stomach. The  Cobra catheter was advanced into the duodenum with a stiff Glidewire. Catheter and wire were advanced into the distal duodenum. The Cobra catheter was removed and exchanged for the 9 French jejunal tube. The J tube was placed over the wire and the tip was placed in the distal duodenum. Contrast injection confirmed placement in the distal duodenum. Contrast was injected through the gastric port to confirm placement in the stomach. Fluoroscopic images were taken and saved for this procedure. FINDINGS: J tube is in  the distal duodenum. IMPRESSION: Successful conversion of the gastrostomy tube to a GJ tube. The tube is ready to be used. Electronically Signed   By: Markus Daft M.D.   On: 11/09/2017 17:02    Assessment/Plan:   Voiding well--will check KUB today   LOS: 25 days   Jorja Loa 11/10/2017, 7:44 AM

## 2017-11-10 NOTE — Progress Notes (Signed)
PROGRESS NOTE    Cory Ibarra  BDZ:[id number] DOB: Jan 26, 1961 DOA: 10/16/2017 PCP: Cory Jun, [name]    Brief Narrative:  Cory Ibarra is an 56 y.o. male past medical history recently diagnosed, with restaging CT scan of the neck chest abdomen and pelvis demonstrating positive response to chemotherapy, status post 2 cycles of therapy, underwent nephrostomy tube placement on the right due to stone, recent apixaban presents to the ED with hematuria from his nephrostomy tube to respond to resolve multiple lites imbalance and poor appetite, he was also during the workup in the ED found to be tachycardic.  His tachycardia resolved spontaneously his electrolytes were repleted and resolved. Interventional was consulted due to poor appetite.  Due to low poor appetite and ongoing weight loss interventional radiologist was consulted PEG tube was placed, and he has been progressive.  Due to significant back pain neurosurgery was consulted and CT scan of the neck was done that showed lytic lesion in C1-C4 with the progression of the pathologic fracture, neurosurgery deemed patient not Cory Ibarra surgical candidate  Foley catheter was placed in the ED, Foley catheter, went reinserted patient started having bleeding through his urethra which is now clearing after holding Eliquis.  On 11/06/2017 large amount controlled tube feedings, 10 or 20 minutes after this he started vomiting.  Tachycardic, hypoxic and hypotensive.   Assessment & Plan:   Active Problems:   Severe back pain   Weight loss, unintentional   Hypophosphatemia   Gross hematuria   Hypocalcemia   Severe protein-calorie malnutrition (HCC)   Hypokalemia   Hypomagnesemia   Nephrostomy status (HCC)   Pulmonary emboli (HCC)   Sinus tachycardia   Lymphoma (HCC)   Lytic bone lesions on xray   Aspiration pneumonia due to regurgitated food (Cory Ibarra)   Goals of care, counseling/discussion  New aspiration pneumonia: Tachycardia and BP improved Unasyn  ordered from 12/4-12/8 (5 day course) Continue IVF Now on RA  Will resume tube feedings, allow some PO as well Palliative started reglan conversion of g tube to j tube by  IR on 12/7  Hypocalcemia/hypokalemia/hypophosphatemia: Electrolytes have been supplemented and monitored. Continues supplement and monitored regularly Encouraged him to take calcium  Sinus tachycardia: Improved, EKG with sinus tach  Depression: Continue Zyprexa.  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 Cannot get denosumab due to persistent hypocalcemia. MRI of the spine showed multiple lytic lesions and compression fraction of L3, neuro surgery was consulted and recommended no intervention as he is not Cory Ibarra candidate. Lumbar corset ordered by neurosurgery. Physical therapy was consulted. Management per oncology Cory Ibarra (see note from 12/4) planning to resume chemotherapy, but wants pt to have tolerated tube feeds for at least 1 week prior to restarting chemotherapy.  Likely will f/u after tube feeds stable and resume reduced Cory Ibarra at that time (pt may need to f/u with provider other than Cory Ibarra as he'll be out in coming weeks, need to touch base with oncology prior to discharge).  History of PE: Restart eliquis, he notes hematuria is improving after traumatic foley  Gross hematuria/Right urethral stone/obstructive uropathy with Nephrostomy status ([place]): S/p nephrostomy placement in 08/2017, last changed on 11/1 Urology consulted for recommendation of definitive treatment.  Severe protein caloric malnutrition/  Weight loss, unintentional: Will plan to restart tube feeds, uptitrate to goal Nutrition c/s  Decubitus Ulcer:  One sacral and one in thoracic  region, L of spine.  Both appear to be stage III.  Wound care c/s.  Sacral decubitus with purulent discharge. Will add  on doxycycline x 5 days.   DVT prophylaxis: eliquis Code Status: DNR Family Communication: none at bedise Disposition Plan: pending stabilization, tolerating tube feeds  Consultants:   Urology  PCT  Oncology  Neurosurgery  IR  Procedures: (Don't include imaging studies which can be auto populated. Include things that cannot be auto populated i.e. Echo, Carotid and venous dopplers, Foley, Bipap, HD, tubes/drains, wound vac, central lines etc)  G tube placed 11/30  Antimicrobials: (specify start and planned stop date. Auto populated tables are space occupying and do not give end dates) Anti-infectives (From admission, onward)   Start     Dose/Rate Route Frequency Ordered Stop   11/06/17 1000  ampicillin-sulbactam (UNASYN) 1.5 g in sodium chloride 0.9 % 50 mL IVPB  Status:  Discontinued     1.5 g 100 mL/hr over 30 Minutes Intravenous Every 6 hours 11/06/17 0847 11/06/17 0907   11/06/17 0930  Ampicillin-Sulbactam (UNASYN) 3 g in sodium chloride 0.9 % 100 mL IVPB     3 g 200 mL/hr over 30 Minutes Intravenous Every 6 hours 11/06/17 0907 11/11/17 0929   11/02/17 0830  ceFAZolin (ANCEF) IVPB 2g/100 mL premix     2 g 200 mL/hr over 30 Minutes Intravenous To Surgery 11/02/17 0815 11/02/17 0916   11/02/17 0820  ceFAZolin (ANCEF) 2-4 GM/100ML-% IVPB    Comments:  Cory Ibarra   : cabinet override      11/02/17 0820 11/02/17 0839   11/02/17 0815  ceFAZolin (ANCEF) powder 2 g  Status:  Discontinued     2 g Other To Surgery 11/02/17 0813 11/02/17 0815   11/02/17 0600  clindamycin (CLEOCIN) IVPB 900 mg  Status:  Discontinued     900 mg 100 mL/hr over 30 Minutes Intravenous 30 min pre-op 10/31/17 1415 11/06/17 0836   10/17/17 1000  sulfamethoxazole-trimethoprim (BACTRIM,SEPTRA) 200-40 MG/5ML suspension 20 mL  Status:  Discontinued    Comments:  Continuous     20 mL Oral Every M-W-F 10/16/17 1558 10/17/17 0936   10/17/17 1000  sulfamethoxazole-trimethoprim (BACTRIM,SEPTRA) 400-80 MG per  tablet 2 tablet     2 tablet Oral Once per day on Mon Wed Fri 10/17/17 0936          Subjective: No complaints.  Objective: Vitals:   11/09/17 0417 11/09/17 1422 11/09/17 2053 11/10/17 0500  BP: 108/62 106/70 108/63 101/67  Pulse: (!) 102 (!) 102 96 81  Resp: 18 (!) 22 18 18   Temp: 97.6 F (36.4 C) 98.1 F (36.7 C) 97.6 F (36.4 C) 97.6 F (36.4 C)  TempSrc: Oral Oral Oral Oral  SpO2: 100% 96% 97% 100%  Weight:   57 kg (125 lb 10.6 oz) 56.5 kg (124 lb 9 oz)  Height:        Intake/Output Summary (Last 24 hours) at 11/10/2017 1005 Last data filed at 11/10/2017 0600 Gross per 24 hour  Intake 1210.17 ml  Output 1075 ml  Net 135.17 ml   Filed Weights   11/08/17 0800 11/09/17 2053 11/10/17 0500  Weight: 58 kg (127 lb 13.9 oz) 57 kg (125 lb 10.6 oz) 56.5 kg (124 lb 9 oz)    Examination:  General: No acute distress.  [place] Cardiovascular: Heart sounds show Kelsea Mousel regular rate, and rhythm. No gallops or rubs. No murmurs. No JVD. Lungs: Clear to auscultation bilaterally with good air movement. No rales, rhonchi  or wheezes. Abdomen: Soft, nontender, nondistended with normal active bowel sounds. No masses. No hepatosplenomegaly.  Cory nephrostomy tube. Neurological: Alert and oriented 3. Moves all extremities 4 with equal strength. Cranial nerves II through XII grossly intact. Skin: ~3 cm stage 3 decubitus ulcer to buttocks with purulent discharge.  L of spine there is another ~2 cm pressure ulcer.  Extremities: No clubbing or cyanosis. No edema.  Psychiatric: Mood and affect are normal. Insight and judgment are appropriate.   Data Reviewed: I have personally reviewed following labs and imaging studies  CBC: Recent Labs  Lab 11/06/17 0537 11/07/17 1041 11/08/17 0416 11/09/17 0441 11/10/17 0335  WBC 16.1* 11.8* 10.3 7.4 4.3  NEUTROABS  --   --   --   --  3.5  HGB 8.7* 8.7* 8.5* 8.8* 8.5*  HCT 27.3* 26.8* 27.1* 27.5* 26.6*  MCV 84.5 84.5 84.4 84.1 84.7  PLT 194 138* 151  150 532   Basic Metabolic Panel: Recent Labs  Lab 11/06/17 0456 11/07/17 0354 11/07/17 1041 11/08/17 0416 11/09/17 0441 11/10/17 0335  NA 133*  --  136 136 138 138  K 3.7  --  3.6 3.6 3.6 3.6  CL 101  --  106 105 103 105  CO2 23  --  24 25 28 27   GLUCOSE 153*  --  84 78 113* 93  BUN 12  --  12 10 5* 7  CREATININE 0.53*  --  <0.30* <0.30* <0.30* <0.30*  CALCIUM 7.4*  --  6.8* 6.9* 7.5* 7.2*  MG 1.8 1.9  --  1.9 1.8 1.9  PHOS 1.5* 2.2*  --  1.9* 2.4* 2.9   GFR: CrCl cannot be calculated (This lab value cannot be used to calculate CrCl because it is not Kairon Shock number: <0.30). Liver Function Tests: Recent Labs  Lab 11/07/17 1431 11/08/17 0416 11/09/17 0441  AST  --  23 22  ALT  --  18 19  ALKPHOS  --  104 111  BILITOT  --  0.8 0.5  PROT  --  4.4* 4.6*  ALBUMIN 2.0* 1.9* 2.2*   No results for input(s): LIPASE, AMYLASE in the last 168 hours. No results for input(s): AMMONIA in the last 168 hours. Coagulation Profile: No results for input(s): INR, PROTIME in the last 168 hours. Cardiac Enzymes: No results for input(s): CKTOTAL, CKMB, CKMBINDEX, TROPONINI in the last 168 hours. BNP (last 3 results) No results for input(s): PROBNP in the last 8760 hours. HbA1C: No results for input(s): HGBA1C in the last 72 hours. CBG: Recent Labs  Lab 11/09/17 1711 11/09/17 2050 11/10/17 0005 11/10/17 0520 11/10/17 0807  GLUCAP 104* 95 96 90 91   Lipid Profile: No results for input(s): CHOL, HDL, LDLCALC, TRIG, CHOLHDL, LDLDIRECT in the last 72 hours. Thyroid Function Tests: No results for input(s): TSH, T4TOTAL, FREET4, T3FREE, THYROIDAB in the last 72 hours. Anemia Panel: No results for input(s): VITAMINB12, FOLATE, FERRITIN, TIBC, IRON, RETICCTPCT in the last 72 hours. Sepsis Labs: Recent Labs  Lab 11/07/17 1041 11/07/17 1432  LATICACIDVEN 0.7 0.8    Recent Results (from the past 240 hour(s))  MRSA PCR Screening     Status: Abnormal   Collection Time: 11/06/17 11:26 AM    Result Value Ref Range Status   MRSA by PCR POSITIVE (Tej Murdaugh) NEGATIVE Final    Comment:        The GeneXpert MRSA Assay (FDA approved for NASAL specimens only), is one component of Daeton Kluth comprehensive MRSA colonization surveillance program. It is  not intended to diagnose MRSA infection nor to guide or monitor treatment for MRSA infections. RESULT CALLED TO, READ BACK BY AND VERIFIED WITH: KAUR,B. RN @1412  ON 12.04.18 BY COHEN,K   Culture, Urine     Status: Abnormal   Collection Time: 11/07/17 10:30 AM  Result Value Ref Range Status   Specimen Description URINE, CLEAN CATCH  Final   Special Requests NONE  Final   Culture (Herndon Grill)  Final    <10,000 COLONIES/mL INSIGNIFICANT GROWTH Performed at [facility], [address], [place], [place] [address]    Report Status 11/08/2017 FINAL  Final         Radiology Studies: Ir Chancy Milroy Darius Bump Per W/fl Mod Sed  Result Date: 11/09/2017 INDICATION: 56 year old with malnutrition. Gastrostomy tube was placed on 11/02/2017. Patient had an aspiration episode and needs conversion to Khristian Seals GJ tube. EXAM: CONVERSION OF GASTROSTOMY TUBE TO GJ TUBE WITH FLUOROSCOPY MEDICATIONS: None ANESTHESIA/SEDATION: None CONTRAST:  20 mL Isovue-300 FLUOROSCOPY TIME:  Fluoroscopy Time: 1 minutes and 54 seconds, 10 mGy COMPLICATIONS: None immediate. PROCEDURE: Patient was placed supine on the interventional table. The gastrostomy tube and surrounding skin were prepped and draped in sterile fashion. Maximal barrier sterile technique was utilized including caps, mask, sterile gowns, sterile gloves, sterile drape, hand hygiene and skin antiseptic. The existing G-tube was cut short. Myleah Cavendish 5 French Cobra catheter was advanced through the 20 [place] gastrostomy tube into the stomach. Contrast was injected into the stomach. The Cobra catheter was advanced into the duodenum with Kwabena Strutz stiff Glidewire. Catheter and wire were advanced into the distal duodenum. The Cobra catheter was  removed and exchanged for the 9 French jejunal tube. The J tube was placed over the wire and the tip was placed in the distal duodenum. Contrast injection confirmed placement in the distal duodenum. Contrast was injected through the gastric port to confirm placement in the stomach. Fluoroscopic images were taken and saved for this procedure. FINDINGS: J tube is in the distal duodenum. IMPRESSION: Successful conversion of the gastrostomy tube to [place] Vandergriff GJ tube. The tube is ready to be used. Electronically Signed   By: Markus Daft M.D.   On: 11/09/2017 17:02        Scheduled Meds: . apixaban  5 mg Oral BID  . calcium carbonate  1,000 mg Oral TID  . Chlorhexidine Gluconate Cloth  6 each Topical Q0600  . feeding supplement (PRO-STAT SUGAR FREE 64)  30 mL Per Tube Daily  . magnesium oxide  400 mg Oral BID  . metoCLOPramide (REGLAN) injection  10 mg Intravenous Q6H   Or  . metoCLOPramide  10 mg Oral Q6H  . metoprolol tartrate  12.5 mg Oral BID  . multivitamin  15 mL Oral Daily  . mupirocin ointment  1 application Nasal BID  . OLANZapine zydis  5 mg Oral QHS  . oxyCODONE  10 mg Oral Q12H  . pantoprazole  40 mg Oral Q lunch  . potassium & sodium phosphates  1 packet Oral TID AC & HS  . predniSONE  20 mg Oral Q breakfast  . sulfamethoxazole-trimethoprim  2 tablet Oral Once per day on Mon Wed Fri  . Vitamin D (Ergocalciferol)  50,000 Units Oral Q Mon   Continuous Infusions: . ampicillin-sulbactam (UNASYN) IV 3 g (11/10/17 0904)  . feeding supplement (OSMOLITE 1.5 CAL) 1,000 mL (11/09/17 2001)     LOS: 25 days    Time spent: over 20 minutes  Case discussed with palliative care and  urology  Fayrene Helper, MD PhD [facility] Pager [phone]  If 7PM-7AM, please contact night-coverage [email or web address] Password [email or web address] 11/10/2017, 10:05 AM

## 2017-11-10 NOTE — Progress Notes (Signed)
IP PROGRESS NOTE  Subjective:  Patient is presently receiving continuous tube feeds and tolerating them well.  Supplements with oral nutrition.  Unfortunately, patient appears to have developed significant decubitus ulcers on his gluteal and thoracic regions of the back.  Possible superficial infection in the lower component.  Patient denies any significant pain.  Objective: Vital signs in last 24 hours: Blood pressure 101/67, pulse 81, temperature 97.6 F (36.4 C), temperature source Oral, resp. rate 18, height 6\' 2"  (1.88 m), weight 124 lb 9 oz (56.5 kg), SpO2 100 %.  Intake/Output from previous day: 12/07 0701 - 12/08 0700 In: 1210.2 [I.V.:10; NG/GT:500.2; IV Piggyback:400] Out: 1075 [Urine:775; Drains:300]  Physical Exam:  Patient is alert, awake, oriented x3.  No acute distress. HEENT: Muscle wasting, no oral mucosal lesions. Lungs: Clear to auscultation bilaterally Cardiac: S1/S2, regular, no murmurs Abdomen: Soft, nontender nondistended.    PEG tube in place Extremities: No lower extremity edema Portacath/PICC-without erythema.  Right nephrostomy in place draining clear yellow urine.   Lab Results: Recent Labs    11/09/17 0441 11/10/17 0335  WBC 7.4 4.3  HGB 8.8* 8.5*  HCT 27.5* 26.6*  PLT 150 150    BMET Recent Labs    11/09/17 0441 11/10/17 0335  NA 138 138  K 3.6 3.6  CL 103 105  CO2 28 27  GLUCOSE 113* 93  BUN 5* 7  CREATININE <0.30* <0.30*  CALCIUM 7.5* 7.2*    Lab Results  Component Value Date   CEA1 0.7 06/28/2017    Studies/Results: Ir Chancy Milroy Francesco Runner Convert Gastr-jej Per W/fl Mod Sed  Result Date: 11/09/2017 INDICATION: 56 year old with malnutrition. Gastrostomy tube was placed on 11/02/2017. Patient had an aspiration episode and needs conversion to a GJ tube. EXAM: CONVERSION OF GASTROSTOMY TUBE TO GJ TUBE WITH FLUOROSCOPY MEDICATIONS: None ANESTHESIA/SEDATION: None CONTRAST:  20 mL Isovue-300 FLUOROSCOPY TIME:  Fluoroscopy Time: 1 minutes and  54 seconds, 10 mGy COMPLICATIONS: None immediate. PROCEDURE: Patient was placed supine on the interventional table. The gastrostomy tube and surrounding skin were prepped and draped in sterile fashion. Maximal barrier sterile technique was utilized including caps, mask, sterile gowns, sterile gloves, sterile drape, hand hygiene and skin antiseptic. The existing G-tube was cut short. A 5 French Cobra catheter was advanced through the 20 [place] gastrostomy tube into the stomach. Contrast was injected into the stomach. The Cobra catheter was advanced into the duodenum with a stiff Glidewire. Catheter and wire were advanced into the distal duodenum. The Cobra catheter was removed and exchanged for the 9 French jejunal tube. The J tube was placed over the wire and the tip was placed in the distal duodenum. Contrast injection confirmed placement in the distal duodenum. Contrast was injected through the gastric port to confirm placement in the stomach. Fluoroscopic images were taken and saved for this procedure. FINDINGS: J tube is in the distal duodenum. IMPRESSION: Successful conversion of the gastrostomy tube to a GJ tube. The tube is ready to be used. Electronically Signed   By: Markus Daft M.D.   On: 11/09/2017 17:02    Medications: I have reviewed the patient's current medications.  Assessment/Plan: 56 y.o. 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  Severe calorie/protein malnutrition and remarkable and persistent electrolyte abnormalities.  Overall, patient continues to require significant amount of  medical care.  He has recurrent hospitalizations for the previously described reasons.  Today, I have discussed the  difficult time that we are having keeping him out of the hospital as well as the high risk of continuation of systemic therapy for his malignancy.  It is hard to withhold therapy from potentially curable cancer, and patient is insistent on continuing attempting to treat him with systemic chemotherapy.  **Lymphoma: We will reassess disease with CT of the chest/abdomen/pelvis and bone scan in lieu of obtaining PET/CT as patient remains hospitalized at this time.  If disease is responding to therapy, we do have better reason to continue attempting treatments.  If there is progressive disease, patient needs to be transitioned to supportive care at this time as he is unlikely to tolerate the second lines of chemotherapy for the lymphoma indication.  Restaging imaging with CT of the neck, chest, abdomen, pelvis and bone scan demonstrated positive response to previous therapy even with reduced intensity of the second cycle of chemo and delay in the administration.Marland Kitchen --I am revising my opinion regarding continuation of his systemic chemotherapy.  Due to persistent ECOG 4 that is not driven by the cancer directly, but more by deconditioning and malnutrition, resumption of chemotherapy is not possible at this point in time. --At the present time, focus of care should be directed at rehabilitating the patient both nutritionally and physically. --I will plan on seeing patient in my outpatient setting following his discharge from the hospital to monitor disease recurrence/progression.  I will abstain from systemic chemotherapy treatment unless disease progresses and patient is in significantly better physical state and he is currently.  **Electrolytes/Nutrition: Severe mixed calorie/protein malnutrition with a good portion of it having to do with complete lack of appetite and effort on the patient's behalf to improve his nutrition.  I have discussed alternative of enteral nutrition via nasogastric or percutaneous gastric  tube versus parenteral nutrition through the vein.  In the context of recurrent infections, parenteral nutrition will have significant risk of causing severe infectious complications including fungal infections and I do not advocate for total parenteral nutrition at this time.  Patient is very apprehensive about tube feeds and promises to make himself eat more at this time. --Continue management at the discretion of the primary treating service  **Disposition:  --At the discretion of primary treating service.  Patient is in clear need of skilled nursing care at discharge from the hospital.  I doubt he will be able to return home even with the most caring family at this point in time due to the amount of care needed.    LOS: 25 days   Ardath Sax, MD   11/10/2017, 10:40 AM

## 2017-11-11 LAB — CBC
HCT: 28.6 % — ABNORMAL LOW (ref 39.0–52.0)
HEMOGLOBIN: 9.1 g/dL — AB (ref 13.0–17.0)
MCH: 26.9 pg (ref 26.0–34.0)
MCHC: 31.8 g/dL (ref 30.0–36.0)
MCV: 84.6 fL (ref 78.0–100.0)
Platelets: 206 10*3/uL (ref 150–400)
RBC: 3.38 MIL/uL — ABNORMAL LOW (ref 4.22–5.81)
RDW: 17.6 % — ABNORMAL HIGH (ref 11.5–15.5)
WBC: 4.5 10*3/uL (ref 4.0–10.5)

## 2017-11-11 LAB — COMPREHENSIVE METABOLIC PANEL
ALBUMIN: 2.2 g/dL — AB (ref 3.5–5.0)
ALT: 23 U/L (ref 17–63)
AST: 22 U/L (ref 15–41)
Alkaline Phosphatase: 102 U/L (ref 38–126)
Anion gap: 6 (ref 5–15)
BILIRUBIN TOTAL: 0.6 mg/dL (ref 0.3–1.2)
BUN: 6 mg/dL (ref 6–20)
CHLORIDE: 103 mmol/L (ref 101–111)
CO2: 29 mmol/L (ref 22–32)
Calcium: 7.6 mg/dL — ABNORMAL LOW (ref 8.9–10.3)
GLUCOSE: 101 mg/dL — AB (ref 65–99)
Potassium: 3.5 mmol/L (ref 3.5–5.1)
Sodium: 138 mmol/L (ref 135–145)
TOTAL PROTEIN: 4.7 g/dL — AB (ref 6.5–8.1)

## 2017-11-11 LAB — GLUCOSE, CAPILLARY
GLUCOSE-CAPILLARY: 124 mg/dL — AB (ref 65–99)
GLUCOSE-CAPILLARY: 125 mg/dL — AB (ref 65–99)
GLUCOSE-CAPILLARY: 96 mg/dL (ref 65–99)
Glucose-Capillary: 81 mg/dL (ref 65–99)
Glucose-Capillary: 137 mg/dL — ABNORMAL HIGH (ref 65–99)

## 2017-11-11 LAB — PHOSPHORUS: Phosphorus: 2.6 mg/dL (ref 2.5–4.6)

## 2017-11-11 LAB — MAGNESIUM: MAGNESIUM: 1.9 mg/dL (ref 1.7–2.4)

## 2017-11-11 NOTE — Progress Notes (Signed)
PROGRESS NOTE    Cory Ibarra  FXT:[id number] DOB: December 21, 1960 DOA: 10/16/2017 PCP: Cory Ibarra    Brief Narrative:  Cory Ibarra is an 56 y.o. male past medical history recently diagnosed, with restaging CT scan of the neck chest abdomen and pelvis demonstrating positive response to chemotherapy, status post 2 cycles of therapy, underwent nephrostomy tube placement on the right due to stone, recent apixaban presents to the ED with hematuria from his nephrostomy tube to respond to resolve multiple lites imbalance and poor appetite, he was also during the workup in the ED found to be tachycardic.  His tachycardia resolved spontaneously his electrolytes were repleted and resolved. Interventional was consulted due to poor appetite.  Due to low poor appetite and ongoing weight loss interventional radiologist was consulted PEG tube was placed, and he has been progressive.  Due to significant back pain neurosurgery was consulted and CT scan of the neck was done that showed lytic lesion in C1-C4 with the progression of the pathologic fracture, neurosurgery deemed patient not Cory Ibarra surgical candidate  Foley catheter was placed in the ED, Foley catheter, went reinserted patient started having bleeding through his urethra which is now clearing after holding Eliquis.  On 11/06/2017 large amount controlled tube feedings, 10 or 20 minutes after this he started vomiting.  Tachycardic, hypoxic and hypotensive.   Assessment & Plan:   Active Problems:   Severe back pain   Weight loss, unintentional   Hypophosphatemia   Gross hematuria   Hypocalcemia   Severe protein-calorie malnutrition (HCC)   Hypokalemia   Hypomagnesemia   Nephrostomy status (HCC)   Pulmonary emboli (HCC)   Sinus tachycardia   Lymphoma (HCC)   Lytic bone lesions on xray   Aspiration pneumonia due to regurgitated food (Cory Ibarra)   Goals of care, counseling/discussion  Aspiration pneumonia: Unasyn ordered from 12/4-12/8 (5 day  course) Continue IVF Now on RA  Will resume tube feedings, allow some PO as well Palliative started reglan conversion of g tube to j tube by  IR on 12/7  Hypocalcemia/hypokalemia/hypophosphatemia: Electrolytes have been supplemented and monitored. Continues supplement and monitored regularly  Sinus tachycardia: Improved  Depression: Continue Zyprexa.  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 Cannot get denosumab due to persistent hypocalcemia. MRI of the spine showed multiple lytic lesions and compression fraction of L3, neuro surgery was consulted and recommended no intervention as he is not Cory Ibarra candidate. Lumbar corset ordered by neurosurgery. Physical therapy was consulted. Management per oncology Dr. Lebron Ibarra (see note from 12/8) notes resumption of chemotherapy not possible at this point and at this point focus on physical and nutritional rehabilitation.  Planning to f/u outpatient to decide on resumption of systemic chemotherapy.  History of PE: Restart eliquis, he notes hematuria is improved after traumatic foley  Gross hematuria/Right urethral stone/obstructive uropathy with Nephrostomy status (Cory Ibarra): S/p nephrostomy placement in 08/2017, last changed on 11/1 Urology consulted for recommendation of definitive treatment.  See Dr. Zannie Ibarra note from 12/7 and 12/8 (earliest they could do shockwave lithotripsy would be 12/13).  Severe protein caloric malnutrition/  Weight loss, unintentional: Will plan to restart tube feeds, uptitrate to goal Nutrition c/s  Decubitus Ulcer:  One sacral and one in thoracic region, L of spine.  See images below.  Wound care c/s appreciated.  Sacral decubitus with purulent discharge. Will add on doxycycline x 5 days and continue to  monitor.   DVT prophylaxis: eliquis Code Status: DNR Family Communication: none  at bedise Disposition Plan: pending stabilization, tolerating tube feeds  Consultants:   Urology  PCT  Oncology  Neurosurgery  IR  Procedures: (Don't include imaging studies which can be auto populated. Include things that cannot be auto populated i.e. Echo, Carotid and venous dopplers, Foley, Bipap, HD, tubes/drains, wound vac, central lines etc)  G tube placed 11/30  Antimicrobials: (specify start and planned stop date. Auto populated tables are space occupying and do not give end dates) Anti-infectives (From admission, onward)   Start     Dose/Rate Route Frequency Ordered Stop   11/10/17 1015  doxycycline (VIBRAMYCIN) 50 MG/5ML syrup 100 mg  Status:  Discontinued     100 mg Oral 2 times daily 11/10/17 1007 11/10/17 1013   11/10/17 1015  doxycycline (VIBRA-TABS) tablet 100 mg     100 mg Oral 2 times daily 11/10/17 1014 11/15/17 0759   11/06/17 1000  ampicillin-sulbactam (UNASYN) 1.5 g in sodium chloride 0.9 % 50 mL IVPB  Status:  Discontinued     1.5 g 100 mL/hr over 30 Minutes Intravenous Every 6 hours 11/06/17 0847 11/06/17 0907   11/06/17 0930  Ampicillin-Sulbactam (UNASYN) 3 g in sodium chloride 0.9 % 100 mL IVPB     3 g 200 mL/hr over 30 Minutes Intravenous Every 6 hours 11/06/17 0907 11/11/17 0400   11/02/17 0830  ceFAZolin (ANCEF) IVPB 2g/100 mL premix     2 g 200 mL/hr over 30 Minutes Intravenous To Surgery 11/02/17 0815 11/02/17 0916   11/02/17 0820  ceFAZolin (ANCEF) 2-4 GM/100ML-% IVPB    Comments:  Cory Ibarra   : cabinet Ibarra      11/02/17 0820 11/02/17 0839   11/02/17 0815  ceFAZolin (ANCEF) powder 2 g  Status:  Discontinued     2 g Other To Surgery 11/02/17 0813 11/02/17 0815   11/02/17 0600  clindamycin (CLEOCIN) IVPB 900 mg  Status:  Discontinued     900 mg 100 mL/hr over 30 Minutes Intravenous 30 min pre-op 10/31/17 1415 11/06/17 0836   10/17/17 1000  sulfamethoxazole-trimethoprim (BACTRIM,SEPTRA) 200-40 MG/5ML suspension 20 mL  Status:   Discontinued    Comments:  Continuous     20 mL Oral Every M-W-F 10/16/17 1558 10/17/17 0936   10/17/17 1000  sulfamethoxazole-trimethoprim (BACTRIM,SEPTRA) 400-80 MG per tablet 2 tablet     2 tablet Oral Once per day on Mon Wed Fri 10/17/17 0936          Subjective: No complaints. Feeling well.  Objective: Vitals:   11/10/17 0500 11/10/17 1400 11/10/17 2041 11/11/17 0500  BP: 101/67 114/75 113/74 112/65  Pulse: 81 94 90 81  Resp: 18 18 19 18   Temp: 97.6 F (36.4 C) 98.1 F (36.7 C) 98 F (36.7 C) 97.9 F (36.6 C)  TempSrc: Oral Oral Oral Oral  SpO2: 100% 98% 100% 99%  Weight: 56.5 kg (124 lb 9 oz)   55.6 kg (122 lb 9.2 oz)  Height:        Intake/Output Summary (Last 24 hours) at 11/11/2017 1438 Last data filed at 11/11/2017 1134 Gross per 24 hour  Intake -  Output 1975 ml  Net -1975 ml   Filed Weights   11/09/17 2053 11/10/17 0500 11/11/17 0500  Weight: 57 kg (125 lb 10.6 oz) 56.5 kg (124 lb 9 oz) 55.6 kg (122 lb 9.2 oz)    Examination:  General: No acute distress. Cardiovascular: Heart sounds show Jemia Fata regular  rate, and rhythm. No gallops or rubs. No murmurs. No JVD. Lungs: Clear to auscultation bilaterally with good air movement. No rales, rhonchi or wheezes. Abdomen: Soft, nontender, nondistended with normal active bowel sounds. No masses. No hepatosplenomegaly.  Neprhostomy tube. Neurological: Alert and oriented 3. Moves all extremities 4 with equal strength. Cranial nerves II through XII grossly intact. Skin: Sacral decub ulcer and pressure ulcer to L of spine as below Extremities: No clubbing or cyanosis. No edema. Pedal pulses 2+. Psychiatric: Mood and affect are normal. Insight and judgment are appropriate. Back  Sacrum   Data Reviewed: I have personally reviewed following labs and imaging studies  CBC: Recent Labs  Lab 11/07/17 1041 11/08/17 0416 11/09/17 0441 11/10/17 0335 11/11/17 0442  WBC 11.8* 10.3 7.4 4.3 4.5  NEUTROABS  --   --   --   3.5  --   HGB 8.7* 8.5* 8.8* 8.5* 9.1*  HCT 26.8* 27.1* 27.5* 26.6* 28.6*  MCV 84.5 84.4 84.1 84.7 84.6  PLT 138* 151 150 150 893   Basic Metabolic Panel: Recent Labs  Lab 11/07/17 0354 11/07/17 1041 11/08/17 0416 11/09/17 0441 11/10/17 0335 11/11/17 0442  NA  --  136 136 138 138 138  K  --  3.6 3.6 3.6 3.6 3.5  CL  --  106 105 103 105 103  CO2  --  24 25 28 27 29   GLUCOSE  --  84 78 113* 93 101*  BUN  --  12 10 5* 7 6  CREATININE  --  <0.30* <0.30* <0.30* <0.30* <0.30*  CALCIUM  --  6.8* 6.9* 7.5* 7.2* 7.6*  MG 1.9  --  1.9 1.8 1.9 1.9  PHOS 2.2*  --  1.9* 2.4* 2.9 2.6   GFR: CrCl cannot be calculated (This lab value cannot be used to calculate CrCl because it is not Kaylyne Axton number: <0.30). Liver Function Tests: Recent Labs  Lab 11/07/17 1431 11/08/17 0416 11/09/17 0441 11/10/17 0335 11/11/17 0442  AST  --  23 22  --  22  ALT  --  18 19  --  23  ALKPHOS  --  104 111  --  102  BILITOT  --  0.8 0.5  --  0.6  PROT  --  4.4* 4.6*  --  4.7*  ALBUMIN 2.0* 1.9* 2.2* 2.1* 2.2*   No results for input(s): LIPASE, AMYLASE in the last 168 hours. No results for input(s): AMMONIA in the last 168 hours. Coagulation Profile: No results for input(s): INR, PROTIME in the last 168 hours. Cardiac Enzymes: No results for input(s): CKTOTAL, CKMB, CKMBINDEX, TROPONINI in the last 168 hours. BNP (last 3 results) No results for input(s): PROBNP in the last 8760 hours. HbA1C: No results for input(s): HGBA1C in the last 72 hours. CBG: Recent Labs  Lab 11/10/17 2038 11/11/17 0021 11/11/17 0457 11/11/17 0745 11/11/17 1133  GLUCAP 135* 125* 96 81 124*   Lipid Profile: No results for input(s): CHOL, HDL, LDLCALC, TRIG, CHOLHDL, LDLDIRECT in the last 72 hours. Thyroid Function Tests: No results for input(s): TSH, T4TOTAL, FREET4, T3FREE, THYROIDAB in the last 72 hours. Anemia Panel: No results for input(s): VITAMINB12, FOLATE, FERRITIN, TIBC, IRON, RETICCTPCT in the last 72  hours. Sepsis Labs: Recent Labs  Lab 11/07/17 1041 11/07/17 1432  LATICACIDVEN 0.7 0.8    Recent Results (from the past 240 hour(s))  MRSA PCR Screening     Status: Abnormal   Collection Time: 11/06/17 11:26 AM  Result Value Ref Range Status  MRSA by PCR POSITIVE (Aldine Grainger) NEGATIVE Final    Comment:        The GeneXpert MRSA Assay (FDA approved for NASAL specimens only), is one component of Marveline Profeta comprehensive MRSA colonization surveillance program. It is not intended to diagnose MRSA infection nor to guide or monitor treatment for MRSA infections. RESULT CALLED TO, READ BACK BY AND VERIFIED WITH: KAUR,B. RN @1412  ON 12.04.18 BY COHEN,K   Culture, Urine     Status: Abnormal   Collection Time: 11/07/17 10:30 AM  Result Value Ref Range Status   Specimen Description URINE, CLEAN CATCH  Final   Special Requests NONE  Final   Culture (Trisha Morandi)  Final    <10,000 COLONIES/mL INSIGNIFICANT GROWTH Performed at [facility], [address], [place], [place] [address]    Report Status 11/08/2017 FINAL  Final         Radiology Studies: Dg Abd 1 View  Result Date: 11/10/2017 CLINICAL DATA:  Ureteral calculus EXAM: ABDOMEN - 1 VIEW COMPARISON:  CT abdomen and pelvis 10/19/2017 FINDINGS: RIGHT nephrostomy tube noted. GJ tube present. Retained contrast in colon. RIGHT paraspinal calcification at mid L4 level 10 x 6 mm likely representing the ureteral calculus visualized on prior CT. Bones demineralized. Subsegmental atelectasis LEFT base. BILATERAL pelvic phleboliths. IMPRESSION: Persistent visualization of Gerrard Crystal 10 x 6 mm proximal to mid RIGHT ureteral calculus at the mid L4 level. Electronically Signed   By: Lavonia Dana M.D.   On: 11/10/2017 12:02   Ir Chancy Milroy Darius Bump Per W/fl Mod Sed  Result Date: 11/09/2017 INDICATION: 56 year old with malnutrition. Gastrostomy tube was placed on 11/02/2017. Patient had an aspiration episode and needs conversion to Molly Savarino GJ tube. EXAM: CONVERSION OF  GASTROSTOMY TUBE TO GJ TUBE WITH FLUOROSCOPY MEDICATIONS: None ANESTHESIA/SEDATION: None CONTRAST:  20 mL Isovue-300 FLUOROSCOPY TIME:  Fluoroscopy Time: 1 minutes and 54 seconds, 10 mGy COMPLICATIONS: None immediate. PROCEDURE: Patient was placed supine on the interventional table. The gastrostomy tube and surrounding skin were prepped and draped in sterile fashion. Maximal barrier sterile technique was utilized including caps, mask, sterile gowns, sterile gloves, sterile drape, hand hygiene and skin antiseptic. The existing G-tube was cut short. Desmund Elman 5 French Cobra catheter was advanced through the 20 [place] gastrostomy tube into the stomach. Contrast was injected into the stomach. The Cobra catheter was advanced into the duodenum with Tomoko Sandra stiff Glidewire. Catheter and wire were advanced into the distal duodenum. The Cobra catheter was removed and exchanged for the 9 French jejunal tube. The J tube was placed over the wire and the tip was placed in the distal duodenum. Contrast injection confirmed placement in the distal duodenum. Contrast was injected through the gastric port to confirm placement in the stomach. Fluoroscopic images were taken and saved for this procedure. FINDINGS: J tube is in the distal duodenum. IMPRESSION: Successful conversion of the gastrostomy tube to Mitzie Marlar GJ tube. The tube is ready to be used. Electronically Signed   By: Markus Daft M.D.   On: 11/09/2017 17:02        Scheduled Meds: . apixaban  5 mg Oral BID  . calcium carbonate  1,000 mg Oral TID  . Chlorhexidine Gluconate Cloth  6 each Topical Q0600  . collagenase   Topical Daily  . doxycycline  100 mg Oral BID  . feeding supplement (PRO-STAT SUGAR FREE 64)  30 mL Per Tube Daily  . magnesium oxide  400 mg Oral BID  . metoCLOPramide (REGLAN) injection  10 mg Intravenous  Q6H   Or  . metoCLOPramide  10 mg Oral Q6H  . metoprolol tartrate  12.5 mg Oral BID  . multivitamin  15 mL Oral Daily  . mupirocin ointment  1 application  Nasal BID  . OLANZapine zydis  5 mg Oral QHS  . oxyCODONE  10 mg Oral Q12H  . pantoprazole  40 mg Oral Q lunch  . potassium & sodium phosphates  1 packet Oral TID AC & HS  . predniSONE  20 mg Oral Q breakfast  . sulfamethoxazole-trimethoprim  2 tablet Oral Once per day on Mon Wed Fri  . Vitamin D (Ergocalciferol)  50,000 Units Oral Q Mon   Continuous Infusions: . feeding supplement (OSMOLITE 1.5 CAL) 1,000 mL (11/09/17 2001)     LOS: 26 days    Time spent: over 20 minutes  Case discussed with palliative care and urology  Fayrene Helper, MD PhD [facility] Pager [phone]  If 7PM-7AM, please contact night-coverage [email or web address] Password [email or web address] 11/11/2017, 2:38 PM

## 2017-11-12 ENCOUNTER — Inpatient Hospital Stay (HOSPITAL_COMMUNITY): Payer: Medicaid Other

## 2017-11-12 DIAGNOSIS — R609 Edema, unspecified: Secondary | ICD-10-CM

## 2017-11-12 DIAGNOSIS — E46 Unspecified protein-calorie malnutrition: Secondary | ICD-10-CM

## 2017-11-12 LAB — ALBUMIN: ALBUMIN: 2.3 g/dL — AB (ref 3.5–5.0)

## 2017-11-12 LAB — CBC
HEMATOCRIT: 28.4 % — AB (ref 39.0–52.0)
HEMOGLOBIN: 9 g/dL — AB (ref 13.0–17.0)
MCH: 26.7 pg (ref 26.0–34.0)
MCHC: 31.7 g/dL (ref 30.0–36.0)
MCV: 84.3 fL (ref 78.0–100.0)
Platelets: 249 10*3/uL (ref 150–400)
RBC: 3.37 MIL/uL — AB (ref 4.22–5.81)
RDW: 17.6 % — ABNORMAL HIGH (ref 11.5–15.5)
WBC: 7.1 10*3/uL (ref 4.0–10.5)

## 2017-11-12 LAB — BASIC METABOLIC PANEL
ANION GAP: 5 (ref 5–15)
BUN: 8 mg/dL (ref 6–20)
CHLORIDE: 103 mmol/L (ref 101–111)
CO2: 29 mmol/L (ref 22–32)
Calcium: 7.7 mg/dL — ABNORMAL LOW (ref 8.9–10.3)
Creatinine, Ser: <0.3 mg/dL — ABNORMAL LOW (ref 0.61–1.24)
Glucose, Bld: 99 mg/dL (ref 65–99)
POTASSIUM: 3.6 mmol/L (ref 3.5–5.1)
SODIUM: 137 mmol/L (ref 135–145)

## 2017-11-12 LAB — GLUCOSE, CAPILLARY
GLUCOSE-CAPILLARY: 106 mg/dL — AB (ref 65–99)
GLUCOSE-CAPILLARY: 116 mg/dL — AB (ref 65–99)
GLUCOSE-CAPILLARY: 121 mg/dL — AB (ref 65–99)
GLUCOSE-CAPILLARY: 85 mg/dL (ref 65–99)
GLUCOSE-CAPILLARY: 92 mg/dL (ref 65–99)
Glucose-Capillary: 122 mg/dL — ABNORMAL HIGH (ref 65–99)
Glucose-Capillary: 127 mg/dL — ABNORMAL HIGH (ref 65–99)

## 2017-11-12 LAB — PHOSPHORUS: PHOSPHORUS: 2.9 mg/dL (ref 2.5–4.6)

## 2017-11-12 LAB — MAGNESIUM: MAGNESIUM: 2.1 mg/dL (ref 1.7–2.4)

## 2017-11-12 NOTE — Progress Notes (Signed)
PROGRESS NOTE    Cory Ibarra  OBS:[id number] DOB: 1961-04-24 DOA: 10/16/2017 PCP: Cory Jun, [name]    Brief Narrative:  Cory Ibarra is an 56 y.o. male past medical history recently diagnosed, with restaging CT scan of the neck chest abdomen and pelvis demonstrating positive response to chemotherapy, status post 2 cycles of therapy, underwent nephrostomy tube placement on the right due to stone, recent apixaban presents to the ED with hematuria from his nephrostomy tube to respond to resolve multiple lites imbalance and poor appetite, he was also during the workup in the ED found to be tachycardic.  His tachycardia resolved spontaneously his electrolytes were repleted and resolved. Interventional was consulted due to poor appetite.  Due to low poor appetite and ongoing weight loss interventional radiologist was consulted PEG tube was placed, and he has been progressive.  Due to significant back pain neurosurgery was consulted and CT scan of the neck was done that showed lytic lesion in C1-C4 with the progression of the pathologic fracture, neurosurgery deemed patient not Cory Ibarra surgical candidate  Foley catheter was placed in the ED, Foley catheter, went reinserted patient started having bleeding through his urethra which is now clearing after holding Eliquis.  On 11/06/2017 large amount controlled tube feedings, 10 or 20 minutes after this he started vomiting.  Tachycardic, hypoxic and hypotensive.   Assessment & Plan:   Active Problems:   Severe back pain   Weight loss, unintentional   Hypophosphatemia   Gross hematuria   Hypocalcemia   Severe protein-calorie malnutrition (HCC)   Hypokalemia   Hypomagnesemia   Nephrostomy status (HCC)   Pulmonary emboli (HCC)   Sinus tachycardia   Lymphoma (HCC)   Lytic bone lesions on xray   Aspiration pneumonia due to regurgitated food (Cory Ibarra)   Goals of care, counseling/discussion  Aspiration pneumonia: Unasyn ordered from 12/4-12/8 (5 day  course) Continue IVF Now on RA  Will resume tube feedings, allow some PO as well Palliative started reglan conversion of g tube to j tube by  IR on 12/7  Hypocalcemia/hypokalemia/hypophosphatemia: Electrolytes have been supplemented and monitored. Continues supplement and monitored regularly  Sinus tachycardia: Improved  Depression: Continue Zyprexa.  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 Cannot get denosumab due to persistent hypocalcemia. MRI of the spine showed multiple lytic lesions and compression fraction of L3, neuro surgery was consulted and recommended no intervention as he is not Cory Ibarra candidate. Lumbar corset ordered by neurosurgery. Physical therapy was consulted. Management per oncology Dr. Lebron Ibarra (see note from 12/8) notes resumption of chemotherapy not possible at this point and at this point focus on physical and nutritional rehabilitation.  Planning to f/u outpatient to decide on resumption of systemic chemotherapy.  History of PE: Restart eliquis, he notes hematuria is improved after traumatic foley  Gross hematuria/Right urethral stone/obstructive uropathy with Nephrostomy status (Cory Ibarra): S/p nephrostomy placement in 08/2017, last changed on 11/1 Urology consulted for recommendation of definitive treatment.  See Dr. Zannie Ibarra note from 12/7 and 12/8 (earliest they could do shockwave lithotripsy would be 12/13).  Severe protein caloric malnutrition/  Weight loss, unintentional: Will plan to restart tube feeds, uptitrate to goal Nutrition c/s  Decubitus Ulcer:  One sacral and one in thoracic region, L of spine.  See images below.  Wound care c/s appreciated.  Sacral decubitus with purulent discharge. Will add on doxycycline x 5 days and continue to  monitor.   DVT prophylaxis: eliquis Code Status: DNR Family Communication: none  at bedise Disposition Plan: pending stabilization, tolerating tube feeds  Consultants:   Urology  PCT  Oncology  Neurosurgery  IR  Procedures: (Don't include imaging studies which can be auto populated. Include things that cannot be auto populated i.e. Echo, Carotid and venous dopplers, Foley, Bipap, HD, tubes/drains, wound vac, central lines etc)  G tube placed 11/30  Antimicrobials: (specify start and planned stop date. Auto populated tables are space occupying and do not give end dates) Anti-infectives (From admission, onward)   Start     Dose/Rate Route Frequency Ordered Stop   11/10/17 1015  doxycycline (VIBRAMYCIN) 50 MG/5ML syrup 100 mg  Status:  Discontinued     100 mg Oral 2 times daily 11/10/17 1007 11/10/17 1013   11/10/17 1015  doxycycline (VIBRA-TABS) tablet 100 mg     100 mg Oral 2 times daily 11/10/17 1014 11/15/17 0759   11/06/17 1000  ampicillin-sulbactam (UNASYN) 1.5 g in sodium chloride 0.9 % 50 mL IVPB  Status:  Discontinued     1.5 g 100 mL/hr over 30 Minutes Intravenous Every 6 hours 11/06/17 0847 11/06/17 0907   11/06/17 0930  Ampicillin-Sulbactam (UNASYN) 3 g in sodium chloride 0.9 % 100 mL IVPB     3 g 200 mL/hr over 30 Minutes Intravenous Every 6 hours 11/06/17 0907 11/11/17 0400   11/02/17 0830  ceFAZolin (ANCEF) IVPB 2g/100 mL premix     2 g 200 mL/hr over 30 Minutes Intravenous To Surgery 11/02/17 0815 11/02/17 0916   11/02/17 0820  ceFAZolin (ANCEF) 2-4 GM/100ML-% IVPB    Comments:  Cory Ibarra   : cabinet override      11/02/17 0820 11/02/17 0839   11/02/17 0815  ceFAZolin (ANCEF) powder 2 g  Status:  Discontinued     2 g Other To Surgery 11/02/17 0813 11/02/17 0815   11/02/17 0600  clindamycin (CLEOCIN) IVPB 900 mg  Status:  Discontinued     900 mg 100 mL/hr over 30 Minutes Intravenous 30 min pre-op 10/31/17 1415 11/06/17 0836   10/17/17 1000  sulfamethoxazole-trimethoprim (BACTRIM,SEPTRA) 200-40 MG/5ML suspension 20 mL  Status:   Discontinued    Comments:  Continuous     20 mL Oral Every M-W-F 10/16/17 1558 10/17/17 0936   10/17/17 1000  sulfamethoxazole-trimethoprim (BACTRIM,SEPTRA) 400-80 MG per tablet 2 tablet     2 tablet Oral Once per day on Mon Wed Fri 10/17/17 0936          Subjective: No complaints. LLE swelling.  Objective: Vitals:   11/10/17 2041 11/11/17 0500 11/11/17 2127 11/12/17 0529  BP: 113/74 112/65 118/67 104/64  Pulse: 90 81 95 100  Resp: 19 18 18 17   Temp: 98 F (36.7 C) 97.9 F (36.6 C) 97.8 F (36.6 C) 98.2 F (36.8 C)  TempSrc: Oral Oral Oral Oral  SpO2: 100% 99% 100% 100%  Weight:  55.6 kg (122 lb 9.2 oz)  56 kg (123 lb 7.3 oz)  Height:        Intake/Output Summary (Last 24 hours) at 11/12/2017 0942 Last data filed at 11/12/2017 0531 Gross per 24 hour  Intake 450 ml  Output 1750 ml  Net -1300 ml   Filed Weights   11/10/17 0500 11/11/17 0500 11/12/17 0529  Weight: 56.5 kg (124 lb 9 oz) 55.6 kg (122 lb 9.2 oz) 56 kg (123 lb 7.3 oz)    Examination:  General: No acute distress. Cardiovascular: Heart sounds show Dakota Vanwart  regular rate, and rhythm. No gallops or rubs. No murmurs. No JVD. Lungs: Clear to auscultation bilaterally with good air movement. No rales, rhonchi or wheezes. Abdomen: Soft, nontender, nondistended with normal active bowel sounds. No masses. No hepatosplenomegaly. Neurological: Alert and oriented 3. Moves all extremities 4 with equal strength. Cranial nerves II through XII grossly intact. Skin: Decubitus ulcers not examined today Extremities: No clubbing or cyanosis. No edema. Pedal pulses 2+. Psychiatric: Mood and affect are normal. Insight and judgment are appropriate.  Back 12/9  Sacrum 12/9    Data Reviewed: I have personally reviewed following labs and imaging studies  CBC: Recent Labs  Lab 11/08/17 0416 11/09/17 0441 11/10/17 0335 11/11/17 0442 11/12/17 0335  WBC 10.3 7.4 4.3 4.5 7.1  NEUTROABS  --   --  3.5  --   --   HGB 8.5* 8.8*  8.5* 9.1* 9.0*  HCT 27.1* 27.5* 26.6* 28.6* 28.4*  MCV 84.4 84.1 84.7 84.6 84.3  PLT 151 150 150 206 784   Basic Metabolic Panel: Recent Labs  Lab 11/08/17 0416 11/09/17 0441 11/10/17 0335 11/11/17 0442 11/12/17 0335  NA 136 138 138 138 137  K 3.6 3.6 3.6 3.5 3.6  CL 105 103 105 103 103  CO2 25 28 27 29 29   GLUCOSE 78 113* 93 101* 99  BUN 10 5* 7 6 8   CREATININE <0.30* <0.30* <0.30* <0.30* <0.30*  CALCIUM 6.9* 7.5* 7.2* 7.6* 7.7*  MG 1.9 1.8 1.9 1.9 2.1  PHOS 1.9* 2.4* 2.9 2.6 2.9   GFR: CrCl cannot be calculated (This lab value cannot be used to calculate CrCl because it is not Abenezer Odonell number: <0.30). Liver Function Tests: Recent Labs  Lab 11/07/17 1431 11/08/17 0416 11/09/17 0441 11/10/17 0335 11/11/17 0442  AST  --  23 22  --  22  ALT  --  18 19  --  23  ALKPHOS  --  104 111  --  102  BILITOT  --  0.8 0.5  --  0.6  PROT  --  4.4* 4.6*  --  4.7*  ALBUMIN 2.0* 1.9* 2.2* 2.1* 2.2*   No results for input(s): LIPASE, AMYLASE in the last 168 hours. No results for input(s): AMMONIA in the last 168 hours. Coagulation Profile: No results for input(s): INR, PROTIME in the last 168 hours. Cardiac Enzymes: No results for input(s): CKTOTAL, CKMB, CKMBINDEX, TROPONINI in the last 168 hours. BNP (last 3 results) No results for input(s): PROBNP in the last 8760 hours. HbA1C: No results for input(s): HGBA1C in the last 72 hours. CBG: Recent Labs  Lab 11/11/17 1635 11/11/17 2125 11/12/17 0009 11/12/17 0522 11/12/17 0738  GLUCAP 137* 121* 106* 85 92   Lipid Profile: No results for input(s): CHOL, HDL, LDLCALC, TRIG, CHOLHDL, LDLDIRECT in the last 72 hours. Thyroid Function Tests: No results for input(s): TSH, T4TOTAL, FREET4, T3FREE, THYROIDAB in the last 72 hours. Anemia Panel: No results for input(s): VITAMINB12, FOLATE, FERRITIN, TIBC, IRON, RETICCTPCT in the last 72 hours. Sepsis Labs: Recent Labs  Lab 11/07/17 1041 11/07/17 1432  LATICACIDVEN 0.7 0.8     Recent Results (from the past 240 hour(s))  MRSA PCR Screening     Status: Abnormal   Collection Time: 11/06/17 11:26 AM  Result Value Ref Range Status   MRSA by PCR POSITIVE (Chelan Heringer) NEGATIVE Final    Comment:        The GeneXpert MRSA Assay (FDA approved for NASAL specimens only), is one component of Adron Geisel comprehensive MRSA colonization  surveillance program. It is not intended to diagnose MRSA infection nor to guide or monitor treatment for MRSA infections. RESULT CALLED TO, READ BACK BY AND VERIFIED WITH: KAUR,B. RN @1412  ON 12.04.18 BY COHEN,K   Culture, Urine     Status: Abnormal   Collection Time: 11/07/17 10:30 AM  Result Value Ref Range Status   Specimen Description URINE, CLEAN CATCH  Final   Special Requests NONE  Final   Culture (Rueben Kassim)  Final    <10,000 COLONIES/mL INSIGNIFICANT GROWTH Performed at [facility], [address], [place], [place] [address]    Report Status 11/08/2017 FINAL  Final         Radiology Studies: Dg Abd 1 View  Result Date: 11/10/2017 CLINICAL DATA:  Ureteral calculus EXAM: ABDOMEN - 1 VIEW COMPARISON:  CT abdomen and pelvis 10/19/2017 FINDINGS: RIGHT nephrostomy tube noted. GJ tube present. Retained contrast in colon. RIGHT paraspinal calcification at mid L4 level 10 x 6 mm likely representing the ureteral calculus visualized on prior CT. Bones demineralized. Subsegmental atelectasis LEFT base. BILATERAL pelvic phleboliths. IMPRESSION: Persistent visualization of Haeven Nickle 10 x 6 mm proximal to mid RIGHT ureteral calculus at the mid L4 level. Electronically Signed   By: Lavonia Dana M.D.   On: 11/10/2017 12:02        Scheduled Meds: . apixaban  5 mg Oral BID  . calcium carbonate  1,000 mg Oral TID  . Chlorhexidine Gluconate Cloth  6 each Topical Q0600  . collagenase   Topical Daily  . doxycycline  100 mg Oral BID  . feeding supplement (PRO-STAT SUGAR FREE 64)  30 mL Per Tube Daily  . magnesium oxide  400 mg Oral BID  . metoCLOPramide  (REGLAN) injection  10 mg Intravenous Q6H   Or  . metoCLOPramide  10 mg Oral Q6H  . metoprolol tartrate  12.5 mg Oral BID  . multivitamin  15 mL Oral Daily  . mupirocin ointment  1 application Nasal BID  . OLANZapine zydis  5 mg Oral QHS  . oxyCODONE  10 mg Oral Q12H  . pantoprazole  40 mg Oral Q lunch  . potassium & sodium phosphates  1 packet Oral TID AC & HS  . predniSONE  20 mg Oral Q breakfast  . sulfamethoxazole-trimethoprim  2 tablet Oral Once per day on Mon Wed Fri  . Vitamin D (Ergocalciferol)  50,000 Units Oral Q Mon   Continuous Infusions: . feeding supplement (OSMOLITE 1.5 CAL) 1,000 mL (11/12/17 0807)     LOS: 27 days    Time spent: over 20 minutes  Fayrene Helper, MD PhD [facility] Pager [phone]  If 7PM-7AM, please contact night-coverage [email or web address] Password [email or web address] 11/12/2017, 9:42 AM

## 2017-11-12 NOTE — Progress Notes (Signed)
PT Cancellation Note  Patient Details Name: Cory Ibarra MRN: [id number] DOB: [birth year]   Cancelled Treatment:    Reason Eval/Treat Not Completed: Other (comment)(doppler's pending. Will follow. )   Philomena Doheny 11/12/2017, 10:43 AM [phone]

## 2017-11-12 NOTE — Progress Notes (Signed)
Left lower extremity venous duplex completed. No evidence of DVTsuperficil thrombosis, or Baker's cyst. Toma Copier, RVS 11/12/2017, 11:13 AM

## 2017-11-12 NOTE — Progress Notes (Signed)
Physical Therapy Treatment Patient Details Name: Cory Ibarra MRN: [id number] DOB: [birth year] Today's Date: 11/12/2017    History of Present Illness 56 year old male with recently diagnosed lymphoma, s/p 2 cycles of chemo and underwent XRT, right ureteral stone and s/p nephrostomy 10/04/17, recent PE and now on apixaban. He present to ED with hematuria through nephrostomy tube. Hematuria spontaneously resolved. He was however found to have multiple electrolyte derangements. In addition, his nutritional status is very poor. Compression deformities at C4, T10, T12, L3.     PT Comments    Worked on gradually raising head of bed, pt able to tolerate ~50* elevation before onset of dizziness. HR 112. Performed BUE/LE exercises with min assist. Did not attempt to sit as pt was dizzy with head elevated above 50*. Encouraged pt to performed isometric LE exercises independently daily for strengthening.     Follow Up Recommendations  SNF;Supervision/Assistance - 24 hour     Equipment Recommendations  None recommended by PT    Recommendations for Other Services       Precautions / Restrictions Precautions Precautions: Fall Precaution Comments: dizzy with sitting Required Braces or Orthoses: Spinal Brace Spinal Brace: Thoracolumbosacral orthotic(TLSO in room, instructions for when to use not found in chart) Restrictions Weight Bearing Restrictions: No    Mobility  Bed Mobility               General bed mobility comments: not attempted, pt dizzy with HOB at 60* elevation  Transfers                 General transfer comment: did not attempt due to dizziness   Ambulation/Gait                 Stairs            Wheelchair Mobility    Modified Rankin (Stroke Patients Only)       Balance                                            Cognition Arousal/Alertness: Awake/alert Behavior During Therapy: WFL for tasks assessed/performed Overall  Cognitive Status: Within Functional Limits for tasks assessed                                 General Comments: pleasant and cooperative      Exercises General Exercises - Upper Extremity Shoulder Flexion: AROM;Both;10 reps;Supine General Exercises - Lower Extremity Ankle Circles/Pumps: AROM;Both;15 reps;Seated Quad Sets: AROM;Both;5 reps;Supine Gluteal Sets: AROM;Both;5 reps;Supine Heel Slides: AAROM;Both;10 reps;Supine Hip ABduction/ADduction: AAROM;Right;10 reps;Supine Straight Leg Raises: (pain in low back when attempted with LLE)    General Comments        Pertinent Vitals/Pain Pain Score: 4  Pain Location: low back Pain Descriptors / Indicators: Sore Pain Intervention(s): Limited activity within patient's tolerance;Monitored during session;Premedicated before session    Home Living                      Prior Function            PT Goals (current goals can now be found in the care plan section) Acute Rehab PT Goals Patient Stated Goal: to walk, go home, shop at [employer] stores PT Goal Formulation: With patient Time For Goal Achievement: 11/14/17 Potential to Achieve Goals: Fair Progress  towards PT goals: Progressing toward goals    Frequency    Min 3X/week      PT Plan Current plan remains appropriate    Co-evaluation              AM-PAC PT "6 Clicks" Daily Activity  Outcome Measure  Difficulty turning over in bed (including adjusting bedclothes, sheets and blankets)?: Unable Difficulty moving from lying on back to sitting on the side of the bed? : Unable Difficulty sitting down on and standing up from a chair with arms (e.g., wheelchair, bedside commode, etc,.)?: Unable Help needed moving to and from a bed to chair (including a wheelchair)?: Total Help needed walking in hospital room?: Total Help needed climbing 3-5 steps with a railing? : Total 6 Click Score: 6    End of Session   Activity Tolerance: Patient limited  by fatigue;Other (comment)(dizziness) Patient left: with call bell/phone within reach;in bed Nurse Communication: Mobility status PT Visit Diagnosis: Muscle weakness (generalized) (M62.81);Difficulty in walking, not elsewhere classified (R26.2);Dizziness and giddiness (R42)     Time: 9758-8325 PT Time Calculation (min) (ACUTE ONLY): 21 min  Charges:  $Therapeutic Exercise: 8-22 mins                    G Codes:          Blondell Reveal Kistler 11/12/2017, 12:02 PM [phone]

## 2017-11-13 LAB — COMPREHENSIVE METABOLIC PANEL
ALK PHOS: 93 U/L (ref 38–126)
ALT: 20 U/L (ref 17–63)
AST: 16 U/L (ref 15–41)
Albumin: 2.3 g/dL — ABNORMAL LOW (ref 3.5–5.0)
Anion gap: 6 (ref 5–15)
BUN: 12 mg/dL (ref 6–20)
CALCIUM: 8.1 mg/dL — AB (ref 8.9–10.3)
CHLORIDE: 104 mmol/L (ref 101–111)
CO2: 29 mmol/L (ref 22–32)
Glucose, Bld: 113 mg/dL — ABNORMAL HIGH (ref 65–99)
Potassium: 3.9 mmol/L (ref 3.5–5.1)
Sodium: 139 mmol/L (ref 135–145)
TOTAL PROTEIN: 4.9 g/dL — AB (ref 6.5–8.1)
Total Bilirubin: 0.6 mg/dL (ref 0.3–1.2)

## 2017-11-13 LAB — GLUCOSE, CAPILLARY
GLUCOSE-CAPILLARY: 106 mg/dL — AB (ref 65–99)
GLUCOSE-CAPILLARY: 104 mg/dL — AB (ref 65–99)
GLUCOSE-CAPILLARY: 164 mg/dL — AB (ref 65–99)
Glucose-Capillary: 114 mg/dL — ABNORMAL HIGH (ref 65–99)
Glucose-Capillary: 136 mg/dL — ABNORMAL HIGH (ref 65–99)

## 2017-11-13 LAB — PHOSPHORUS: Phosphorus: 3.7 mg/dL (ref 2.5–4.6)

## 2017-11-13 LAB — C-REACTIVE PROTEIN: CRP: 0.9 mg/dL (ref <1.0)

## 2017-11-13 LAB — CBC
HCT: 28 % — ABNORMAL LOW (ref 39.0–52.0)
Hemoglobin: 8.9 g/dL — ABNORMAL LOW (ref 13.0–17.0)
MCH: 26.8 pg (ref 26.0–34.0)
MCHC: 31.8 g/dL (ref 30.0–36.0)
MCV: 84.3 fL (ref 78.0–100.0)
PLATELETS: 297 10*3/uL (ref 150–400)
RBC: 3.32 MIL/uL — AB (ref 4.22–5.81)
RDW: 17.9 % — ABNORMAL HIGH (ref 11.5–15.5)
WBC: 5.8 10*3/uL (ref 4.0–10.5)

## 2017-11-13 LAB — MAGNESIUM: Magnesium: 2.1 mg/dL (ref 1.7–2.4)

## 2017-11-13 LAB — SEDIMENTATION RATE: Sed Rate: 38 mm/hr — ABNORMAL HIGH (ref 0–16)

## 2017-11-13 MED ORDER — LEVOFLOXACIN 750 MG PO TABS
750.0000 mg | ORAL_TABLET | Freq: Every day | ORAL | Status: DC
  Administered 2017-11-13 – 2017-11-14 (×2): 750 mg via ORAL
  Filled 2017-11-13 (×2): qty 1

## 2017-11-13 MED ORDER — HEPARIN SOD (PORK) LOCK FLUSH 100 UNIT/ML IV SOLN: 500.0000 [IU] | INTRAVENOUS | Status: DC | PRN

## 2017-11-13 NOTE — Progress Notes (Signed)
PROGRESS NOTE    Cory Ibarra  DJT:[id number] DOB: Mar 13, 1961 DOA: 10/16/2017 PCP: [name], [name]    Brief Narrative:  Cory Ibarra is an 56 y.o. male past medical history recently diagnosed, with restaging CT scan of the neck chest abdomen and pelvis demonstrating positive response to chemotherapy, status post 2 cycles of therapy, underwent nephrostomy tube placement on the right due to stone, recent apixaban presents to the ED with hematuria from his nephrostomy tube to respond to resolve multiple lites imbalance and poor appetite, he was also during the workup in the ED found to be tachycardic.  His tachycardia resolved spontaneously his electrolytes were repleted and resolved. Interventional was consulted due to poor appetite.  Due to low poor appetite and ongoing weight loss interventional radiologist was consulted PEG tube was placed, and he has been progressive.  Due to significant back pain neurosurgery was consulted and CT scan of the neck was done that showed lytic lesion in C1-C4 with the progression of the pathologic fracture, neurosurgery deemed patient not [name] surgical candidate  Foley catheter was placed in the ED, Foley catheter, went reinserted patient started having bleeding through his urethra which is now clearing after holding Eliquis.  On 11/06/2017 large amount controlled tube feedings, 10 or 20 minutes after this he started vomiting.  Tachycardic, hypoxic and hypotensive.   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 Cannot get denosumab due to persistent hypocalcemia. MRI of the spine showed multiple lytic lesions and compression fraction of L3, neuro surgery was consulted and recommended no intervention as he is not Monta Maiorana candidate.  Lumbar corset ordered by neurosurgery. Physical therapy was consulted. Management per oncology Dr. [name] (see note from 12/8) notes resumption of chemotherapy not possible at this point and at this point focus on physical and nutritional rehabilitation.  Planning to f/u outpatient to decide on resumption of systemic chemotherapy.  Decubitus Ulcers:  Unstageable.  One sacral and one in thoracic region, L of spine.  See images below.  Wound care c/s appreciated.  He's declining Darriel Utter mattress replacement.  Encouraged patient to turn side to side.   Extend doxycycline to 10 days Will start levaquin for 10 days given bluish green discharge to wound Follow up ESR/CRP  Severe protein caloric malnutrition/  Weight loss, unintentional: Tube feeds at goal (he asked to slow them down 12/11 PM bc he was nauseous) Nutrition c/s  Hypocalcemia/hypokalemia/hypophosphatemia: Electrolytes have been supplemented and monitored. Continues supplement and monitored regularly  Sinus tachycardia: Improved  Depression: Continue Zyprexa.  History of PE: Continue eliquis  Gross hematuria/Right urethral stone/obstructive uropathy with Nephrostomy status ([place]): S/p nephrostomy placement in 08/2017, last changed on 11/1 Urology consulted for recommendation of definitive treatment.  Per Dr. [name], from 12/11 note, will plan outpatient urology follow up.    Aspiration pneumonia: Unasyn ordered from 12/4-12/8 (5 day course) Continue IVF Now on RA  Will resume tube feedings, allow  some PO as well Palliative started reglan conversion of g tube to j tube by  IR on 12/7  L  foot edema:  No pain, [place] negative for DVT.   DVT prophylaxis: eliquis Code Status: DNR Family Communication: none at bedise Disposition Plan: pending stabilization, tolerating tube feeds  Consultants:   Urology  PCT  Oncology  Neurosurgery  IR  Procedures: (Don't include imaging studies which can be auto populated. Include things that cannot be auto populated i.e. Echo, Carotid and venous dopplers, Foley, Bipap, HD, tubes/drains, wound vac, central lines etc)  G tube placed 11/30  Antimicrobials: (specify start and planned stop date. Auto populated tables are space occupying and do not give end dates) Anti-infectives (From admission, onward)   Start     Dose/Rate Route Frequency Ordered Stop   11/10/17 1015  doxycycline (VIBRAMYCIN) 50 MG/5ML syrup 100 mg  Status:  Discontinued     100 mg Oral 2 times daily 11/10/17 1007 11/10/17 1013   11/10/17 1015  doxycycline (VIBRA-TABS) tablet 100 mg     100 mg Oral 2 times daily 11/10/17 1014 11/15/17 0759   11/06/17 1000  ampicillin-sulbactam (UNASYN) 1.5 g in sodium chloride 0.9 % 50 mL IVPB  Status:  Discontinued     1.5 g 100 mL/hr over 30 Minutes Intravenous Every 6 hours 11/06/17 0847 11/06/17 0907   11/06/17 0930  Ampicillin-Sulbactam (UNASYN) 3 g in sodium chloride 0.9 % 100 mL IVPB     3 g 200 mL/hr over 30 Minutes Intravenous Every 6 hours 11/06/17 0907 11/11/17 0400   11/02/17 0830  ceFAZolin (ANCEF) IVPB 2g/100 mL premix     2 g 200 mL/hr over 30 Minutes Intravenous To Surgery 11/02/17 0815 11/02/17 0916   11/02/17 0820  ceFAZolin (ANCEF) 2-4 GM/100ML-% IVPB    Comments:  [name]   : cabinet override      11/02/17 0820 11/02/17 0839   11/02/17 0815  ceFAZolin (ANCEF) powder 2 g  Status:  Discontinued     2 g Other To Surgery 11/02/17 0813 11/02/17 0815   11/02/17 0600  clindamycin (CLEOCIN) IVPB 900 mg  Status:  Discontinued     900 mg 100 mL/hr over 30 Minutes Intravenous 30 min pre-op 10/31/17 1415 11/06/17  0836   10/17/17 1000  sulfamethoxazole-trimethoprim (BACTRIM,SEPTRA) 200-40 MG/5ML suspension 20 mL  Status:  Discontinued    Comments:  Continuous     20 mL Oral Every M-W-F 10/16/17 1558 10/17/17 0936   10/17/17 1000  sulfamethoxazole-trimethoprim (BACTRIM,SEPTRA) 400-80 MG per tablet 2 tablet     2 tablet Oral Once per day on Mon Wed Fri 10/17/17 0936          Subjective: No complaints today.   Objective: Vitals:   11/12/17 1157 11/12/17 1434 11/12/17 2045 11/13/17 0535  BP:  102/60 114/68 126/72  Pulse: (!) 112 95 98 (!) 108  Resp:  20 18 18   Temp:  98.2 F (36.8 C) 98.1 F (36.7 C) 98.2 F (36.8 C)  TempSrc:  Oral Oral Oral  SpO2:  98% 97% 99%  Weight:    56 kg (123 lb 8 oz)  Height:        Intake/Output Summary (Last 24 hours) at 11/13/2017 1141 Last data filed at 11/13/2017 0833 Gross per 24 hour  Intake 2547.67 ml  Output 1550 ml  Net 997.67 ml   Filed Weights   11/11/17 0500 11/12/17 0529 11/13/17 0535  Weight: 55.6 kg (122 lb 9.2 oz) 56 kg (123  lb 7.3 oz) 56 kg (123 lb 8 oz)    Examination:  General: No acute distress. Cardiovascular: Heart sounds show Meleah Demeyer regular rate, and rhythm. No gallops or rubs. No murmurs. No JVD. Lungs: Clear to auscultation bilaterally with good air movement. No rales, rhonchi or wheezes. Abdomen: Soft, nontender, nondistended with normal active bowel sounds. No masses. No hepatosplenomegaly. Neurological: Alert and oriented 3. Moves all extremities 4 with equal strength. Cranial nerves II through XII grossly intact. Skin: Decubitus ulcer to thoracic region of back with greenish/blueish discharge, yellowish discharge to sacral decub Extremities: No clubbing or cyanosis. L foot edema.  Psychiatric: Mood and affect are normal. Insight and judgment are appropriate.    Back 12/9  [place] 12/9    Data Reviewed: I have personally reviewed following labs and imaging studies  CBC: Recent Labs  Lab 11/09/17 0441 11/10/17 0335  11/11/17 0442 11/12/17 0335 11/13/17 0418  WBC 7.4 4.3 4.5 7.1 5.8  NEUTROABS  --  3.5  --   --   --   HGB 8.8* 8.5* 9.1* 9.0* 8.9*  HCT 27.5* 26.6* 28.6* 28.4* 28.0*  MCV 84.1 84.7 84.6 84.3 84.3  PLT 150 150 206 249 122   Basic Metabolic Panel: Recent Labs  Lab 11/09/17 0441 11/10/17 0335 11/11/17 0442 11/12/17 0335 11/13/17 0418  NA 138 138 138 137 139  K 3.6 3.6 3.5 3.6 3.9  CL 103 105 103 103 104  CO2 28 27 29 29 29   GLUCOSE 113* 93 101* 99 113*  BUN 5* 7 6 8 12   CREATININE <0.30* <0.30* <0.30* <0.30* <0.30*  CALCIUM 7.5* 7.2* 7.6* 7.7* 8.1*  MG 1.8 1.9 1.9 2.1 2.1  PHOS 2.4* 2.9 2.6 2.9 3.7   GFR: CrCl cannot be calculated (This lab value cannot be used to calculate CrCl because it is not Ziyon Soltau number: <0.30). Liver Function Tests: Recent Labs  Lab 11/08/17 0416 11/09/17 0441 11/10/17 0335 11/11/17 0442 11/12/17 0335 11/13/17 0418  AST 23 22  --  22  --  16  ALT 18 19  --  23  --  20  ALKPHOS 104 111  --  102  --  93  BILITOT 0.8 0.5  --  0.6  --  0.6  PROT 4.4* 4.6*  --  4.7*  --  4.9*  ALBUMIN 1.9* 2.2* 2.1* 2.2* 2.3* 2.3*   No results for input(s): LIPASE, AMYLASE in the last 168 hours. No results for input(s): AMMONIA in the last 168 hours. Coagulation Profile: No results for input(s): INR, PROTIME in the last 168 hours. Cardiac Enzymes: No results for input(s): CKTOTAL, CKMB, CKMBINDEX, TROPONINI in the last 168 hours. BNP (last 3 results) No results for input(s): PROBNP in the last 8760 hours. HbA1C: No results for input(s): HGBA1C in the last 72 hours. CBG: Recent Labs  Lab 11/12/17 1158 11/12/17 1713 11/12/17 2231 11/13/17 0536 11/13/17 0745  GLUCAP 116* 122* 127* 106* 104*   Lipid Profile: No results for input(s): CHOL, HDL, LDLCALC, TRIG, CHOLHDL, LDLDIRECT in the last 72 hours. Thyroid Function Tests: No results for input(s): TSH, T4TOTAL, FREET4, T3FREE, THYROIDAB in the last 72 hours. Anemia Panel: No results for input(s):  VITAMINB12, FOLATE, FERRITIN, TIBC, IRON, RETICCTPCT in the last 72 hours. Sepsis Labs: Recent Labs  Lab 11/07/17 1041 11/07/17 1432  LATICACIDVEN 0.7 0.8    Recent Results (from the past 240 hour(s))  MRSA PCR Screening     Status: Abnormal   Collection Time: 11/06/17 11:26 AM  Result  Value Ref Range Status   MRSA by PCR POSITIVE (Giavana Rooke) NEGATIVE Final    Comment:        The GeneXpert MRSA Assay (FDA approved for NASAL specimens only), is one component of Breon Rehm comprehensive MRSA colonization surveillance program. It is not intended to diagnose MRSA infection nor to guide or monitor treatment for MRSA infections. RESULT CALLED TO, READ BACK BY AND VERIFIED WITH: KAUR,B. RN @1412  ON 12.04.18 BY COHEN,K   Culture, Urine     Status: Abnormal   Collection Time: 11/07/17 10:30 AM  Result Value Ref Range Status   Specimen Description URINE, CLEAN CATCH  Final   Special Requests NONE  Final   Culture (Issa Luster)  Final    <10,000 COLONIES/mL INSIGNIFICANT GROWTH Performed at [facility], [address], [place], [place] [address]    Report Status 11/08/2017 FINAL  Final         Radiology Studies: No results found.      Scheduled Meds: . apixaban  5 mg Oral BID  . calcium carbonate  1,000 mg Oral TID  . Chlorhexidine Gluconate Cloth  6 each Topical Q0600  . collagenase   Topical Daily  . doxycycline  100 mg Oral BID  . feeding supplement (PRO-STAT SUGAR FREE 64)  30 mL Per Tube Daily  . magnesium oxide  400 mg Oral BID  . metoCLOPramide (REGLAN) injection  10 mg Intravenous Q6H   Or  . metoCLOPramide  10 mg Oral Q6H  . metoprolol tartrate  12.5 mg Oral BID  . multivitamin  15 mL Oral Daily  . mupirocin ointment  1 application Nasal BID  . OLANZapine zydis  5 mg Oral QHS  . oxyCODONE  10 mg Oral Q12H  . pantoprazole  40 mg Oral Q lunch  . potassium & sodium phosphates  1 packet Oral TID AC & HS  . predniSONE  20 mg Oral Q breakfast  . sulfamethoxazole-trimethoprim   2 tablet Oral Once per day on Mon Wed Fri  . Vitamin D (Ergocalciferol)  50,000 Units Oral Q Mon   Continuous Infusions: . feeding supplement (OSMOLITE 1.5 CAL) 1,000 mL (11/13/17 0500)     LOS: 28 days    Time spent: over 20 minutes  Fayrene Helper, MD PhD [facility] Pager [phone]  If 7PM-7AM, please contact night-coverage [email or web address] Password [email or web address] 11/13/2017, 11:41 AM

## 2017-11-13 NOTE — Consult Note (Addendum)
[facility] Nurse wound consult note Reason for Consult: Unstageable Pressure injury to the thoracic spine (full thickness).  Patient is insistent that this is not a new PrI, that it was incurred during his Rehab stay. I did not see this wound during my previous assessment on 11/10/17, but that could have been due to his positioning Wound type:Pressure Pressure Injury POA: Yes Measurement:2cm x 1.5cm with depth unable to be determined due ot the presence of yellow/white slough covering the wound bed. Wound bed:As described above Drainage (amount, consistency, odor) small amount of white/light yellow exudate on silicone dressing consistent with autolytically debriding slough. Periwound: two circular ulcerations are located in the periwound at 4 and 5 o'clock, full thickness.  Both measure 0.4cm round x 0.1cm deep with pink wound beds, scant exudate. Dressing procedure/placement/frequency: I have ammended the previous wound care orders to include this mid thoracic area.  Santyl (collagenase) ointment topped with a saline moistened gauze dressing, followed by a dry gauze dressing and topped with silicone foam is appropriate.  Of course, patient would improve more quickly if he would agree to a mattress replacement with low air loss feature or decrease the HOB to below a 30 degree angle or finally, turn side to side instead of lying in the supine position, but he chooses not to. He is pleasant and appreciative for my visit today.  [facility] nursing team will not follow, but will remain available to this patient, the nursing and medical teams.  Please re-consult if needed. Thanks, Maudie Flakes, MSN, RN, [place], Arther Abbott  Pager# [phone]

## 2017-11-13 NOTE — Progress Notes (Signed)
Pt stated he was feeling sick to his stomach like he may throw up; pt believes this is due to the tube feedings and asked to have the rate decreased. MD Florene Glen paged and verbally ordered to turn rate to 91ml/hr for tonight and have it increased to 61ml/hr in the morning.

## 2017-11-13 NOTE — Progress Notes (Signed)
Nutrition Follow-up  DOCUMENTATION CODES:   Severe malnutrition in context of chronic illness, Underweight  INTERVENTION:   Continue with Osmolite 1.5 @ 60 ml/hr 30 ml Prostat Q24  This provides 2260 kcal, 105 g protein, and 1097 ml free water. Meets 100% of calories and protein needs.   NUTRITION DIAGNOSIS:   Severe Malnutrition related to cancer and cancer related treatments, chronic illness as evidenced by percent weight loss, energy intake < or equal to 75% for > or equal to 1 month, severe fat depletion, moderate muscle depletion.  Ongoing  GOAL:   Patient will meet greater than or equal to 90% of their needs  Meeting with tube feeding  MONITOR:   PO intake, Supplement acceptance, Labs, Weight trends, Skin, I & O's  REASON FOR ASSESSMENT:   Consult Enteral/tube feeding initiation and management  ASSESSMENT:   56 y.o. 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  Of note, patient was recently diagnosed with pulmonary embolism and has been continued on Eliquis prior to admission.  During workup, patient was also noted to be tachycardic and was subsequently referred to the emergency department for further workup.   11/30- PEG tube placed 12/7- conversion of G tube to J tube  Pt tolerating Osmolite 1.5 @ goal rate of 60 ml/hr with Prostat Q24. Meeting 100% of calories and protein needs at this time.  Spoke with pt at bedside who denies any complications. Pt is drinking mostly water with orange juice. Denies any issues upon swallowing. WOC following for unstageable sacral wound. Weight noted to be down 1 lb since RD visit. Will continue to monitor trends. Plan for future chemotherapy, but focusing on physical and nutritional rehabilitation first.   Medications  reviewed and include: mag oxide, liquid MVI, potassium and sodium phosphates, prednisone, VitD Labs reviewed.   Diet Order:  Diet regular Room service appropriate? Yes; Fluid consistency: Thin  EDUCATION NEEDS:   Education needs have been addressed  Skin:  Skin Assessment: Skin Integrity Issues: Skin Integrity Issues:: Stage II Stage II: ischial tuberosity  Last BM:  11/09/17  Height:   Ht Readings from Last 1 Encounters:  10/16/17 6\' 2"  (1.88 m)    Weight:   Wt Readings from Last 1 Encounters:  11/13/17 123 lb 8 oz (56 kg)    Ideal Body Weight:  86.36 kg  BMI:  Body mass index is 15.86 kg/m.  Estimated Nutritional Needs:   Kcal:  2150-2350 kcal/day  Protein:  105-115 g/day  Fluid:  >2.2 L/day    Mariana Single RD, LDN Clinical Nutrition Pager # - [phone]

## 2017-11-13 NOTE — Care Management Note (Signed)
Case Management Note  Patient Details  Name: Cory Ibarra MRN: [id number] Date of Birth: 1961/01/18  Subjective/Objective:                  Continues with intolerance of the tube feeds  Action/Plan:  Date:  November 13, 2017 Chart reviewed for concurrent status and case management needs.  Will continue to follow patient progress.  Discharge Planning: following for needs  Expected discharge date: November 16, 2017  Velva Harman, BSN, [place], [place]   Expected Discharge Date:  (UNKNOWN)               Expected Discharge Plan:  [place]  In-House Referral:  Clinical Social Work  Discharge planning Services  CM Consult  Post Acute Care Choice:    Choice offered to:     DME Arranged:    DME Agency:     HH Arranged:    [place] Agency:     Status of Service:  In process, will continue to follow  If discussed at Long Length of Stay Meetings, dates discussed:    Additional Comments:  Leeroy Cha, RN 11/13/2017, 9:17 AM

## 2017-11-13 NOTE — Progress Notes (Signed)
Subjective/Chief Complaint:  1 - Gross Hematuria - new gross hematuria 08/2017. No clots.  Non-smoker. Known right ureteral stone as per below. Contrast CT within last 30 days w/o other GU masses. Known thrombocytopenia as well at 70k.  2 - Right Ureteral + Renal Stones - 85mm Rt lower pole + 61mm prox ureteral stone by CT 07/2017 incidental on lymphoma staging. No colic or interval passage. Rt neph tube placed by IR 08/21/17 after failed attempt at retrograde stenting (ureteral stone too impacted). Exchanged 11/1.    3 - Bacteruria -pane-sensitive e. Coli bacteruria during admission 08/2017.   4 - Prostate Screening - PSA 2018 3.25.  PMH sig for lymphoma (partially treated with chemo, now on hold given failure to thrive), pathologic C-spine fracture, Failure to thrive / PEG tube. Presently in rehab facility when out of hospital.   Today "Cory Ibarra" is stable. Feeling much stronger with current tube feeds. His goal is to get back out of hospital and to rehab maybe later this week.    Objective: Vital signs in last 24 hours: Temp:  [98.1 F (36.7 C)-98.2 F (36.8 C)] 98.2 F (36.8 C) (12/11 0535) Pulse Rate:  [95-112] 108 (12/11 0535) Resp:  [18-20] 18 (12/11 0535) BP: (102-126)/(60-72) 126/72 (12/11 0535) SpO2:  [97 %-99 %] 99 % (12/11 0535) Weight:  [56 kg (123 lb 8 oz)] 56 kg (123 lb 8 oz) (12/11 0535) Last BM Date: 11/09/17  Intake/Output from previous day: 12/10 0701 - 12/11 0700 In: 2427.7 [P.O.:980; I.V.:10; NG/GT:1437.7] Out: 1350 [Urine:880; Drains:470] Intake/Output this shift: Total I/O In: 120 [P.O.:120] Out: 200 [Urine:200]  General appearance: alert, cooperative and appears stated age Eyes: negative Nose: Nares normal. Septum midline. Mucosa normal. No drainage or sinus tenderness. Throat: lips, mucosa, and tongue normal; teeth and gums normal Neck: supple, symmetrical, trachea midline Back: symmetric, no curvature. ROM normal. No CVA tenderness. Resp:  non-labored on room air.  Cardio: Nl rate GI: scaphoid wtih PEG tube in place on feeds.  Male genitalia: normal Extremities: extremities normal, atraumatic, no cyanosis or edema Skin: Skin color, texture, turgor normal. No rashes or lesions Lymph nodes: Cervical, supraclavicular, and axillary nodes normal.  Rt neph tube in place with clear yellow urine, no hematomas.   Lab Results:  Recent Labs    11/12/17 0335 11/13/17 0418  WBC 7.1 5.8  HGB 9.0* 8.9*  HCT 28.4* 28.0*  PLT 249 297   BMET Recent Labs    11/12/17 0335 11/13/17 0418  NA 137 139  K 3.6 3.9  CL 103 104  CO2 29 29  GLUCOSE 99 113*  BUN 8 12  CREATININE <0.30* <0.30*  CALCIUM 7.7* 8.1*   PT/INR No results for input(s): LABPROT, INR in the last 72 hours. ABG No results for input(s): PHART, HCO3 in the last 72 hours.  Invalid input(s): PCO2, PO2  Studies/Results: No results found.  Anti-infectives: Anti-infectives (From admission, onward)   Start     Dose/Rate Route Frequency Ordered Stop   11/10/17 1015  doxycycline (VIBRAMYCIN) 50 MG/5ML syrup 100 mg  Status:  Discontinued     100 mg Oral 2 times daily 11/10/17 1007 11/10/17 1013   11/10/17 1015  doxycycline (VIBRA-TABS) tablet 100 mg     100 mg Oral 2 times daily 11/10/17 1014 11/15/17 0759   11/06/17 1000  ampicillin-sulbactam (UNASYN) 1.5 g in sodium chloride 0.9 % 50 mL IVPB  Status:  Discontinued     1.5 g 100 mL/hr over 30 Minutes Intravenous Every  6 hours 11/06/17 0847 11/06/17 0907   11/06/17 0930  Ampicillin-Sulbactam (UNASYN) 3 g in sodium chloride 0.9 % 100 mL IVPB     3 g 200 mL/hr over 30 Minutes Intravenous Every 6 hours 11/06/17 0907 11/11/17 0400   11/02/17 0830  ceFAZolin (ANCEF) IVPB 2g/100 mL premix     2 g 200 mL/hr over 30 Minutes Intravenous To Surgery 11/02/17 0815 11/02/17 0916   11/02/17 0820  ceFAZolin (ANCEF) 2-4 GM/100ML-% IVPB    Comments:  [name]   : cabinet override      11/02/17 0820 11/02/17 0839    11/02/17 0815  ceFAZolin (ANCEF) powder 2 g  Status:  Discontinued     2 g Other To Surgery 11/02/17 0813 11/02/17 0815   11/02/17 0600  clindamycin (CLEOCIN) IVPB 900 mg  Status:  Discontinued     900 mg 100 mL/hr over 30 Minutes Intravenous 30 min pre-op 10/31/17 1415 11/06/17 0836   10/17/17 1000  sulfamethoxazole-trimethoprim (BACTRIM,SEPTRA) 200-40 MG/5ML suspension 20 mL  Status:  Discontinued    Comments:  Continuous     20 mL Oral Every M-W-F 10/16/17 1558 10/17/17 0936   10/17/17 1000  sulfamethoxazole-trimethoprim (BACTRIM,SEPTRA) 400-80 MG per tablet 2 tablet     2 tablet Oral Once per day on Mon Wed Fri 10/17/17 0936        Assessment/Plan:  1 - Gross Hematuria - likely from Rt ureteral stone, this has essentially resolved.   2 - Right Ureteral + Renal Stones - now temporized with neph tube. Will need antegrade ureteroscopy ideally in elective setting when doing better physiologically. He is very poor surgical candidate at present.   We will arrange for Urol office follow up in few weeks to reassess his overall status for possible surgery.   3 - Bacteruria - no active infectious parameters.   4 - Prostate Screening - up to date this year.   We will arrange Urology follow up in office. I recommend NO GU procedural intervention this admission.   [facility], Cory Ibarra 11/13/2017

## 2017-11-14 DIAGNOSIS — C7951 Secondary malignant neoplasm of bone: Secondary | ICD-10-CM

## 2017-11-14 DIAGNOSIS — E46 Unspecified protein-calorie malnutrition: Secondary | ICD-10-CM

## 2017-11-14 DIAGNOSIS — N201 Calculus of ureter: Secondary | ICD-10-CM

## 2017-11-14 DIAGNOSIS — E878 Other disorders of electrolyte and fluid balance, not elsewhere classified: Secondary | ICD-10-CM

## 2017-11-14 LAB — COMPREHENSIVE METABOLIC PANEL
ALT: 21 U/L (ref 17–63)
AST: 21 U/L (ref 15–41)
Albumin: 2.3 g/dL — ABNORMAL LOW (ref 3.5–5.0)
Alkaline Phosphatase: 96 U/L (ref 38–126)
Anion gap: 6 (ref 5–15)
BILIRUBIN TOTAL: 0.5 mg/dL (ref 0.3–1.2)
BUN: 13 mg/dL (ref 6–20)
CO2: 28 mmol/L (ref 22–32)
Calcium: 7.7 mg/dL — ABNORMAL LOW (ref 8.9–10.3)
Chloride: 100 mmol/L — ABNORMAL LOW (ref 101–111)
Glucose, Bld: 104 mg/dL — ABNORMAL HIGH (ref 65–99)
POTASSIUM: 4 mmol/L (ref 3.5–5.1)
Sodium: 134 mmol/L — ABNORMAL LOW (ref 135–145)
TOTAL PROTEIN: 4.8 g/dL — AB (ref 6.5–8.1)

## 2017-11-14 LAB — CBC
HEMATOCRIT: 28.5 % — AB (ref 39.0–52.0)
Hemoglobin: 9.1 g/dL — ABNORMAL LOW (ref 13.0–17.0)
MCH: 26.9 pg (ref 26.0–34.0)
MCHC: 31.9 g/dL (ref 30.0–36.0)
MCV: 84.3 fL (ref 78.0–100.0)
Platelets: 376 10*3/uL (ref 150–400)
RBC: 3.38 MIL/uL — ABNORMAL LOW (ref 4.22–5.81)
RDW: 18 % — AB (ref 11.5–15.5)
WBC: 11 10*3/uL — AB (ref 4.0–10.5)

## 2017-11-14 LAB — GLUCOSE, CAPILLARY
GLUCOSE-CAPILLARY: 106 mg/dL — AB (ref 65–99)
GLUCOSE-CAPILLARY: 129 mg/dL — AB (ref 65–99)
Glucose-Capillary: 106 mg/dL — ABNORMAL HIGH (ref 65–99)
Glucose-Capillary: 116 mg/dL — ABNORMAL HIGH (ref 65–99)
Glucose-Capillary: 124 mg/dL — ABNORMAL HIGH (ref 65–99)

## 2017-11-14 LAB — PHOSPHORUS: Phosphorus: 3.7 mg/dL (ref 2.5–4.6)

## 2017-11-14 LAB — MAGNESIUM: Magnesium: 2 mg/dL (ref 1.7–2.4)

## 2017-11-14 MED ORDER — VITAMIN D (ERGOCALCIFEROL) 1.25 MG (50000 UNIT) PO CAPS: 50000.0000 [IU] | ORAL_CAPSULE | ORAL | Status: DC

## 2017-11-14 MED ORDER — OXYCODONE HCL ER 10 MG PO T12A: 10.0000 mg | EXTENDED_RELEASE_TABLET | Freq: Two times a day (BID) | ORAL | 0 refills | Status: DC

## 2017-11-14 MED ORDER — DOXYCYCLINE HYCLATE 100 MG PO TABS: 100.0000 mg | ORAL_TABLET | Freq: Two times a day (BID) | ORAL | Status: AC

## 2017-11-14 MED ORDER — LORAZEPAM 0.5 MG PO TABS
0.5000 mg | ORAL_TABLET | Freq: Four times a day (QID) | ORAL | 0 refills | Status: AC | PRN
Start: 2017-11-14 — End: 2017-11-17

## 2017-11-14 MED ORDER — MAGNESIUM OXIDE 400 (241.3 MG) MG PO TABS: 400.0000 mg | ORAL_TABLET | Freq: Two times a day (BID) | ORAL | Status: AC

## 2017-11-14 MED ORDER — SULFAMETHOXAZOLE-TRIMETHOPRIM 400-80 MG PO TABS: 2.0000 | ORAL_TABLET | ORAL | Status: AC

## 2017-11-14 MED ORDER — OXYCODONE HCL 10 MG PO TABS: 10.0000 mg | ORAL_TABLET | Freq: Four times a day (QID) | ORAL | 0 refills | Status: DC | PRN

## 2017-11-14 MED ORDER — OSMOLITE 1.5 CAL PO LIQD: 1000.0000 mL | ORAL | 0 refills | Status: DC

## 2017-11-14 MED ORDER — METOPROLOL TARTRATE 25 MG PO TABS: 12.5000 mg | ORAL_TABLET | Freq: Two times a day (BID) | ORAL | Status: DC

## 2017-11-14 MED ORDER — METOCLOPRAMIDE HCL 10 MG PO TABS: 10.0000 mg | ORAL_TABLET | Freq: Three times a day (TID) | ORAL | Status: AC

## 2017-11-14 MED ORDER — PRO-STAT SUGAR FREE PO LIQD: 30.0000 mL | Freq: Every day | ORAL | Status: DC

## 2017-11-14 MED ORDER — POTASSIUM & SODIUM PHOSPHATES 280-160-250 MG PO PACK: 1.0000 | PACK | Freq: Three times a day (TID) | ORAL | Status: DC

## 2017-11-14 MED ORDER — HEPARIN SOD (PORK) LOCK FLUSH 100 UNIT/ML IV SOLN
500.0000 [IU] | INTRAVENOUS | Status: AC | PRN
  Administered 2017-11-14: 500 [IU]

## 2017-11-14 MED ORDER — ADULT MULTIVITAMIN LIQUID CH: 15.0000 mL | Freq: Every day | ORAL | Status: DC

## 2017-11-14 MED ORDER — COLLAGENASE 250 UNIT/GM EX OINT: TOPICAL_OINTMENT | Freq: Every day | CUTANEOUS | Status: DC

## 2017-11-14 NOTE — Progress Notes (Addendum)
LCSW following for return to SNF.  Patient will return to [facility] at dc.  Room 131A  RN report #: [phone]  LCSW confirmed with facility.  LCSW attempted to notify family. Left message for Sarim Rothman, [place].  Patient will transport by [facility]. [facility] confirmed at 5:00. They are running behind and it will be later in the evening before they pick up.   Carolin Coy [place]

## 2017-11-14 NOTE — Discharge Summary (Signed)
Physician Discharge Summary  Cory Ibarra GXQ:[id number] DOB: [id number] DOA: 10/16/2017  PCP: Cory Jun, [name]  Admit date: 10/16/2017 Discharge date: 11/14/2017  Time spent: 35 minutes  Recommendations for Outpatient Follow-up:  1. Repeat basic metabolic panel to follow electrolytes and renal function repeat 2. Phosphorus and magnesium level, further replete as needed 3. Consult repositioning and the use of air mattress as part of treatment for pressure ulcers. 4. Palliative care to follow patient at the facility 5. 6 more days of antibiotics at discharge. 6. See below for further recommendations   Discharge Diagnoses:  Active Problems:   Severe back pain   Weight loss, unintentional   Hypophosphatemia   Gross hematuria   Hypocalcemia   Severe protein-calorie malnutrition (HCC)   Hypokalemia   Hypomagnesemia   Nephrostomy status (HCC)   Pulmonary emboli (HCC)   Sinus tachycardia   Lymphoma (HCC)   Lytic bone lesions on xray   Aspiration pneumonia due to regurgitated food ([place])   Goals of care, counseling/discussion   Calculus of ureter   Malnutrition [facility])   Discharge Condition: Patient discharged back to skilled nursing facility for further care and rehabilitation.  Palliative care service to follow patient progress and improvement with further transitioning to comfort care if he failed continue improving.  Diet recommendation: Feeding tube as major source of nutrition; dysphagia 3 diet with thin liquids recommended.  Filed Weights   11/11/17 0500 11/12/17 0529 11/13/17 0535  Weight: 55.6 kg (122 lb 9.2 oz) 56 kg (123 lb 7.3 oz) 56 kg (123 lb 8 oz)    History of present illness:  As per H&P written by Dr. Marylu Ibarra on 10/16/17 56 y.o. 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  Of note, patient was recently diagnosed with pulmonary embolism and has been continued on Eliquis prior to admission.  During workup, patient was also noted to be tachycardic and was subsequently referred to the emergency department for further workup.  ED Course: In the emergency department, patient was noted to have heart rate in the low 120s.  Potassium was noted to be 2.7 with calcium of 6.4.  Most recent magnesium was noted to be 1.6.  Patient was given 2 g of magnesium in the emergency department.  Repeat magnesium level is pending.  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 -Cannot get denosumab due to persistent hypocalcemia. -MRI of the spine showed multiple lytic lesions and compression fraction of L3, neuro surgery was consulted and recommended no intervention as he is not a candidate.  -Lumbar corset ordered by neurosurgery (to be used when up). -Physical therapy was consulted and recommended SNF -Management per oncology -Dr. [name] (see note from 12/8) notes resumption of chemotherapy not possible at this point and at this point focus on physical and nutritional rehabilitation.  Planning to f/u outpatient to decide on resumption of systemic chemotherapy.  Decubitus Ulcers:  Unstageable.  One sacral and one in thoracic region, L of spine.   -Wound care consultation and recommendations appreciated.   -Patient declined mattress replacement while inpatient.   -Encouraged patient to turn side to side and to use her mattress to decrease  pressure on the  -complete empiric course of antibiotics with doxycycline  Severe protein caloric malnutrition/Weight loss, unintentional: -Tube feeds at  goal -Appreciate nutritional service recommendations and assistance.  Hypocalcemia/hypokalemia/hypophosphatemia: -Electrolytes have been supplemented and monitored intermittently -will recommend follow up as an outpatient and further repletion as needed -maintenance supplementation on Mg and potassium started.  Sinus tachycardia: -Improved/resolved -continue lopressor -advise to maintain adequate hydration.  Depression: -Continue Zyprexa.  History of PE: -Continue eliquis  Gross hematuria/Right urethral stone/obstructive uropathy withNephrostomy status ([place]): S/p nephrostomy placement in 08/2017, last changed on 11/1 -Urology consulted for recommendation of definitive treatment.  Per Dr. Tresa Ibarra, from 12/11 note, will plan outpatient urology follow up.   -Patient with good urine output and no further hematuria at the moment of discharge.  Aspiration pneumonia: -Unasyn ordered from 12/4-12/8 (5 day course) -Patient advised to maintain adequate hydration (now essentially through G-tube) -No fever, WBCs essentially within normal limits and the patient with good oxygen saturation on room air. -As per palliative recommendations will continue reglan TID -Conversion of g tube to j tube by  IR on 12/7 -Precaution and HOB elevation recommended  Left foot edema: -Swelling has by now completely resolved -Lower extremity duplex negative for DVTs  Procedures:  See below for x-ray reports  G-tube placed 11/30; >> converted to J tube on 12/7  Lower extremity duplex: Negative for DVT  Consultations:  Urology  Oncology  Neurosurgery  Palliative care consult  IR  Discharge Exam: Vitals:   11/14/17 0432 11/14/17 1403  BP: 111/68 (!) 100/57  Pulse: 99 98  Resp: 18 18  Temp: 97.7 F (36.5 C) 98.7 F (37.1 C)  SpO2: 98% 98%    General: Afebrile, no chest pain, no shortness of breath, no nausea or vomiting.  Patient in no acute distress. Cardiovascular: S1 and S2, no  rubs, no gallops, no JVD. Respiratory: Good air movement bilaterally, no wheezing, no crackles. Abdomen: Soft, nontender, nondistended, positive bowel sounds, G-tube in place (no signs of erythema or drainage. Extremities: No cyanosis, no clubbing.  No edema. Skin: Unstageable Decubitus ulcer appreciated in the thoracic region and also in the sacral region (greenish/yellowish discharge appreciated, no odor, no surrounding erythema).  Discharge Instructions   Discharge Instructions    Diet - low sodium heart healthy   Complete by:  As directed    Discharge instructions   Complete by:  As directed    Take medication as prescribed Keep yourself well-hydrated Follow-up with PCP in 10 days Complete antibiotics and follow wound care instructions     Allergies as of 11/14/2017      Reactions   Bee Venom Swelling      Medication List    STOP taking these medications   multivitamin with minerals Tabs tablet   sulfamethoxazole-trimethoprim 200-40 MG/5ML suspension Commonly known as:  BACTRIM,SEPTRA Replaced by:  sulfamethoxazole-trimethoprim 400-80 MG tablet     TAKE these medications   apixaban 5 MG Tabs tablet Commonly known as:  ELIQUIS Take 2 tabs po BID x7 days, then take 1 tab po BID   calcium elemental as carbonate 400 MG chewable tablet Commonly known as:  BARIATRIC TUMS ULTRA Chew 3 tablets (1,200 mg total) 3 (three) times daily by mouth.   chlorhexidine 0.12 % solution Commonly known as:  PERIDEX 15 mLs by Mouth Rinse route 2 (two) times daily.   Cholecalciferol 50000 units Tabs Take 50,000 Units once a week by mouth. for 8 weeks, then return for level recheck What changed:  when to take this  additional instructions   collagenase ointment Commonly known as:  SANTYL Apply topically daily. Applied to wounds in his sacral area and also thoracic spine once a day. Start taking on:  11/15/2017   doxycycline 100 MG tablet Commonly known as:  VIBRA-TABS Take  1 tablet (100 mg total) by mouth 2 (two) times daily for 6 days.   dronabinol 5 MG capsule Commonly known as:  MARINOL Take 1 capsule (5 mg total) daily before lunch by mouth.   feeding supplement (OSMOLITE 1.5 CAL) Liqd Place 1,000 mLs into feeding tube continuous.   feeding supplement (PRO-STAT SUGAR FREE 64) Liqd Place 30 mLs into feeding tube daily. Start taking on:  11/15/2017   Lidocaine-Prilocaine (Bulk) 2.5-2.5 % Crea Apply to affected areas topically one time daily   LORazepam 0.5 MG tablet Commonly known as:  ATIVAN Take 1 tablet (0.5 mg total) by mouth every 6 (six) hours as needed for up to 3 days (Nausea or vomiting).   magnesium oxide 400 (241.3 Mg) MG tablet Commonly known as:  MAG-OX Take 1 tablet (400 mg total) by mouth 2 (two) times daily.   metoCLOPramide 10 MG tablet Commonly known as:  REGLAN Take 1 tablet (10 mg total) by mouth 3 (three) times daily before meals.   metoprolol tartrate 25 MG tablet Commonly known as:  LOPRESSOR Take 0.5 tablets (12.5 mg total) by mouth 2 (two) times daily.   multivitamin Liqd Take 15 mLs by mouth daily. Start taking on:  11/15/2017   OLANZapine zydis 5 MG disintegrating tablet Commonly known as:  ZYPREXA Take 0.5 tablets (2.5 mg total) by mouth at bedtime.   oxyCODONE 10 mg 12 hr tablet Commonly known as:  OXYCONTIN Take 1 tablet (10 mg total) by mouth every 12 (twelve) hours. What changed:    Another medication with the same name was changed. Make sure you understand how and when to take each.  Another medication with the same name was removed. Continue taking this medication, and follow the directions you see here.   Oxycodone HCl 10 MG Tabs Take 1 tablet (10 mg total) by mouth every 6 (six) hours as needed (for pain). What changed:    when to take this  Another medication with the same name was removed. Continue taking this medication, and follow the directions you see here.   pantoprazole 40 MG  tablet Commonly known as:  PROTONIX Take 40 mg by mouth daily with lunch.   polyethylene glycol packet Commonly known as:  MIRALAX / GLYCOLAX Take 17 g daily as needed by mouth for moderate constipation.   potassium & sodium phosphates 280-160-250 MG Pack Commonly known as:  PHOS-NAK Take 1 packet by mouth 4 (four) times daily -  before meals and at bedtime.   predniSONE 10 MG tablet Commonly known as:  DELTASONE Take 2 tablets (20 mg total) daily with breakfast by mouth.   prochlorperazine 10 MG tablet Commonly known as:  COMPAZINE Take 10 mg every 6 (six) hours as needed by mouth for nausea or vomiting.   senna-docusate 8.6-50 MG tablet Commonly known as:  Senokot-S Take 1 tablet by mouth daily as needed for mild constipation.   sulfamethoxazole-trimethoprim 400-80 MG tablet Commonly known as:  BACTRIM,SEPTRA Take 2 tablets by mouth 3 (three) times a week. Start taking on:  11/16/2017 Replaces:  sulfamethoxazole-trimethoprim 200-40 MG/5ML suspension   Vitamin D (Ergocalciferol) 50000 units Caps capsule Commonly known as:  DRISDOL Take 1 capsule (50,000 Units total) by  mouth every Monday. Start taking on:  11/19/2017      Allergies  Allergen Reactions  . Bee Venom Swelling   Follow-up Information    Cory Jun, [name]. Schedule an appointment as soon as possible for a visit in 10 day(s).   Specialty:  Family Medicine Contact information: [place] [address] [phone]           The results of significant diagnostics from this hospitalization (including imaging, microbiology, ancillary and laboratory) are listed below for reference.    Significant Diagnostic Studies: Dg Abd 1 View  Result Date: 11/10/2017 CLINICAL DATA:  Ureteral calculus EXAM: ABDOMEN - 1 VIEW COMPARISON:  CT abdomen and pelvis 10/19/2017 FINDINGS: RIGHT nephrostomy tube noted. GJ tube present. Retained contrast in colon. RIGHT paraspinal calcification at mid L4 level  10 x 6 mm likely representing the ureteral calculus visualized on prior CT. Bones demineralized. Subsegmental atelectasis LEFT base. BILATERAL pelvic phleboliths. IMPRESSION: Persistent visualization of a 10 x 6 mm proximal to mid RIGHT ureteral calculus at the mid L4 level. Electronically Signed   By: Lavonia Dana M.D.   On: 11/10/2017 12:02   Ct Soft Tissue Neck W Contrast  Result Date: 10/19/2017 CLINICAL DATA:  56 year old male with diffuse large B-cell lymphoma status post 2 cycles of therapy. Spinal involvement. EXAM: CT NECK WITH CONTRAST TECHNIQUE: Multidetector CT imaging of the neck was performed using the standard protocol following the bolus administration of intravenous contrast. CONTRAST:  178m ISOVUE-300 IOPAMIDOL (ISOVUE-300) INJECTION 61% in conjunction with contrast enhanced imaging of the chest, abdomen, and pelvis reported separately. COMPARISON:  No prior neck or head CT. Chest CTA 10/03/2017 and earlier. Chest CT today reported separately. FINDINGS: Pharynx and larynx: Asymmetric medial deviation of the right vocal fold raises the possibility of right vocal cord palsy and appears to be new since October (series 5, image 71). Posterior to the right aryepiglottic fold there is a circumscribed partially round, partially triangular simple fluid density mass encompassing 10 x 22 x 14 mm (AP by transverse by CC) with subtotal effacement of the right piriform sinus. See series 5, image 61 and sagittal image 49. This is separate from the epiglottis which remains normal. There is mild adjacent linear mucosal enhancement. The lesion slightly crosses midline to the left. The epiglottis is unaffected and normal. Elsewhere the hypopharynx is normal. oropharynx and nasopharynx contours appear normal. Negative parapharyngeal and retropharyngeal spaces. Salivary glands: Sublingual space, submandibular glands and parotid glands are within normal limits. Thyroid: Negative. Lymph nodes: Bilateral cervical  lymph nodes are within normal limits. A conspicuous left level IIa node measures 6-7 mm short axis (sagittal image 74). No abnormally enlarged lymph nodes. No cystic or necrotic nodes. Vascular: Right chest IJ approach porta cath. Major vascular structures in the neck and at the skullbase are patent. Limited intracranial: Negative. Visualized orbits: Negative. Mastoids and visualized paranasal sinuses: Visible paranasal sinuses are clear. Visible tympanic cavities are clear. Mild right mastoid effusion. The left mastoids are clear. Skeleton: Lytic destruction of the condyle the right mandible in the TMJ, most apparent laterally (series 13, image 9). The right temporal fossa appears normal. Subtotal lytic destruction of the right C1 lateral mass with collapse (coronal image 58). Lytic lesion extends toward the midline in the right anterior C1 ring. 3 cm lytic lesion mildly expanding the right C2 lamina tracking to midline (series 13, image 34. No definite associated intraspinal mass (series 5, image 34). Lytic lesion of the left C3 transverse  process and lamina (series 13, image 40). Subtotal lytic destruction of the C4 vertebral body with mild pathologic fracture (sagittal image 53). Bilateral C5 neural foraminal involvement suspected. Bilateral lytic lesion in the bilateral T1 posterior elements. See also chest findings reported separately. Upper chest: Chest CT today reported separately. IMPRESSION: 1. No prior head or neck CT. 2. 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 Associated pathologic fractures of the right C1 lateral mass and the C4 vertebral body. 3. No lymphadenopathy in the neck. 4. There is a 2 cm circumscribed cyst in the posterior right hypopharynx contiguous with the right AE fold which appears to be new since the CTA chest 10/03/2017. Burtis Junes this is a benign retention cyst. 5. Right mastoid effusion, likely reactive to #2. 6. See  CT Chest, Abdomen, and  Pelvis today reported separately. Electronically Signed   By: Genevie Ann M.D.   On: 10/19/2017 12:18   Ct Chest W Contrast  Result Date: 10/19/2017 CLINICAL DATA:  Diffuse large B-cell lymphoma. EXAM: CT CHEST, ABDOMEN, AND PELVIS WITH CONTRAST TECHNIQUE: Multidetector CT imaging of the chest, abdomen and pelvis was performed following the standard protocol during bolus administration of intravenous contrast. CONTRAST:  139m ISOVUE-300 IOPAMIDOL (ISOVUE-300) INJECTION 61%, 328mISOVUE-300 IOPAMIDOL (ISOVUE-300) INJECTION 61% COMPARISON:  Chest CT 10/03/2017 and prior CT scans 07/30/2017 FINDINGS: CT CHEST FINDINGS Cardiovascular: The heart is normal in size. No pericardial effusion. Stable mild tortuosity and ectasia of the thoracic aorta. Minimal atherosclerotic calcifications. The branch vessels are patent. No definite coronary artery calcifications. Mediastinum/Nodes: No mediastinal or hilar mass or lymphadenopathy. The esophagus is grossly normal. Lungs/Pleura: Patchy interstitial changes and underlying emphysema. Biapical pleural and parenchymal scarring. Stable small nodular lesion in the left upper lobe adjacent to the mediastinum on image number 9 which measures 5.5 mm. Persistent areas of subpleural atelectasis but no new pulmonary lesions or acute superimposed pulmonary findings. Musculoskeletal: Much improved left chest wall mass this measured approximately 4.8 x 3.4 cm on the prior CT scan from July 30, 2017 and now measures 3.0 x 1.0 cm. Stable destructive bone lesions but no new or progressive findings involving the ribs or scapula. Small lytic lesions are noted in the right humeral head which appears stable. CT ABDOMEN PELVIS FINDINGS Hepatobiliary: No focal hepatic lesions or intrahepatic biliary dilatation. The gallbladder is mildly distended but no definite gallstones or findings for acute cholecystitis. No common bile duct dilatation. Pancreas: No mass, inflammation or ductal dilatation.  Spleen: Normal size.  No focal lesions. Adrenals/Urinary Tract: The adrenal glands and left kidney are unremarkable. There is a right-sided nephrostomy tube in place. Small right renal calculi are noted. There are 2 adjacent calculi in the upper lobe right ureter measuring E total a maximum of 7.5 mm. Moderate right-sided hydroureteronephrosis. No distal ureteral calculi or bladder calculi. No bladder mass. There is a Foley catheter in the bladder. Stomach/Bowel: The stomach, duodenum, small bowel and colon are unremarkable. No acute inflammatory changes, mass lesions or obstructive findings. Vascular/Lymphatic: The aorta and branch vessels are patent. The major venous structures are patent. Left-sided retroperitoneal nodal mass seen on the prior study the adjacent to the aorta measured 3.1 x 1.7 cm. It now has the appearance of matted low-attenuation soft tissue density measuring approximately 23 x 8 mm. No new mesenteric or retroperitoneal lymph nodes. No pelvic adenopathy. No inguinal adenopathy. Left para-aortic lymph node on the prior study measured 18 x 12 mm and is now matted  soft tissue density measuring 11 x 5 mm. Reproductive: The prostate gland and seminal vesicles are unremarkable. Other: None Musculoskeletal: Diffuse osseous involvement is again demonstrated but findings overall improved. The soft tissue mass associated with the destructive bony lesion involving the right iliac crest has resolved. The right iliac crest bone lesion appears stable. Right iliac bone lesion appears smaller and slightly calcified. The right pubic bone lesion has improved. There is healing changes, calcification and no residual soft tissue component. Stable small lytic lesions in the right femoral head. Right acetabular lesion appears stable. New line stable vertebral plana at L3 and stable appearing involvement of T10 and T12. IMPRESSION: 1. Overall improved CT appearance of the patient's lymphoma most significantly  involving the osseous structures. No new or progressive bony findings. 2. Significant improvement of the retroperitoneal lymph nodes. No new or progressive findings in the abdomen or pelvis. 3. Right-sided externalized nephrostomy tube with obstructing upper ureteral calculi and associated mild to moderate hydroureteronephrosis. 4. Stable lung findings. Electronically Signed   By: Marijo Sanes M.D.   On: 10/19/2017 15:37   Mr Cervical Spine W Wo Contrast  Result Date: 10/27/2017 CLINICAL DATA:  56 y/o  M; diffuse large B-cell lymphoma. EXAM: MRI CERVICAL, THORACIC AND LUMBAR SPINE WITHOUT AND WITH CONTRAST TECHNIQUE: Multiplanar and multiecho pulse sequences of the cervical spine, to include the craniocervical junction and cervicothoracic junction, and thoracic and lumbar spine, were obtained without intravenous contrast. COMPARISON:  10/19/2017 nuclear medicine scan. 10/19/2017 CT of the chest abdomen and pelvis. CONTRAST:  None. FINDINGS: MRI CERVICAL SPINE FINDINGS Alignment: Straightening of cervical lordosis, mild focal reversal of curvature at C4 level. No listhesis. Vertebrae: Enhancing lesions in the right posterior C2 elements, C4 vertebral body, and left C3 facet likely representing metastasis. Mild pathologic fracture of C4 vertebral body with 30% loss of height. Cord: Normal signal and morphology. No cord or leptomeningeal enhancement. No epidural mass. Posterior Fossa, vertebral arteries, paraspinal tissues: Peripherally enhancing cystic lesion in the right posterior hypopharynx anterior displacing the right aryepiglottic fold measuring 12 x 22 mm (series 31, image 20). Disc levels: C2-3: No significant disc displacement, foraminal stenosis, or canal stenosis. C3-4: No significant disc displacement, foraminal stenosis, or canal stenosis. Mild facet hypertrophy. C4-5: Small disc osteophyte complex with right greater than left uncovertebral and facet hypertrophy. Mild right foraminal stenosis. Disc  contact on anterior cord with mild cord flattening. No significant canal stenosis. C5-6: Tiny central disc protrusion. No significant foraminal or canal stenosis. C6-7: No significant disc displacement, foraminal stenosis, or canal stenosis. C7-T1: No significant disc displacement, foraminal stenosis, or canal stenosis. MRI THORACIC SPINE FINDINGS Alignment:  Physiologic. Vertebrae: There several enhancing lesions within the posterior elements, T10, and right T12 vertebral body compatible with metastatic disease. Additionally there are several lesions within the visualized posterior ribs and left scapular tip. Mild loss of endplate height in the right aspect of superior T10 and T12 vertebral bodies right-sided enhancing metastasis compatible with pathologic fracture. Cord: Normal signal and morphology. No cord or leptomeningeal enhancement. No epidural mass. Paraspinal and other soft tissues: Negative. Disc levels: No significant foraminal or canal stenosis. MRI LUMBAR SPINE FINDINGS Segmentation:  Standard. Alignment:  Physiologic. Vertebrae: Moderate L3 vertebral body pathologic compression deformity stable from prior CT of chest abdomen and pelvis. Bony retropulsion is eccentric to the left with severe narrowing of the left neural foramen and mild canal stenosis. Additional enhancing lesion within the right sacral ala and the right ilium. Conus medullaris and cauda equina:  Conus extends to the L1 level. Conus and cauda equina appear normal. Paraspinal and other soft tissues: Negative. Disc levels: Mild L4-5 disc bulge and lower lumbar facet hypertrophy. Mild bilateral L4-5 foraminal stenosis. No significant canal stenosis. IMPRESSION: 1. Multiple vertebral body, posterior element, rib, sacrum, and left scapula enhancing metastasis. 2. No epidural, leptomeningeal, or cord metastasis. 3. Pathologic mild C4, mild T10, mild T12, and moderate L3 compression deformities. 4. Mild bony retropulsion of L3 vertebral body  eccentric to left with mild canal and severe left L3-4 foraminal stenosis. 5. Cystic lesion in right posterior hypopharynx measuring up to 22 mm, direct visualization recommended. Electronically Signed   By: Kristine Garbe M.D.   On: 10/27/2017 15:49   Mr Thoracic Spine W Wo Contrast  Result Date: 10/27/2017 CLINICAL DATA:  56 y/o  M; diffuse large B-cell lymphoma. EXAM: MRI CERVICAL, THORACIC AND LUMBAR SPINE WITHOUT AND WITH CONTRAST TECHNIQUE: Multiplanar and multiecho pulse sequences of the cervical spine, to include the craniocervical junction and cervicothoracic junction, and thoracic and lumbar spine, were obtained without intravenous contrast. COMPARISON:  10/19/2017 nuclear medicine scan. 10/19/2017 CT of the chest abdomen and pelvis. CONTRAST:  None. FINDINGS: MRI CERVICAL SPINE FINDINGS Alignment: Straightening of cervical lordosis, mild focal reversal of curvature at C4 level. No listhesis. Vertebrae: Enhancing lesions in the right posterior C2 elements, C4 vertebral body, and left C3 facet likely representing metastasis. Mild pathologic fracture of C4 vertebral body with 30% loss of height. Cord: Normal signal and morphology. No cord or leptomeningeal enhancement. No epidural mass. Posterior Fossa, vertebral arteries, paraspinal tissues: Peripherally enhancing cystic lesion in the right posterior hypopharynx anterior displacing the right aryepiglottic fold measuring 12 x 22 mm (series 31, image 20). Disc levels: C2-3: No significant disc displacement, foraminal stenosis, or canal stenosis. C3-4: No significant disc displacement, foraminal stenosis, or canal stenosis. Mild facet hypertrophy. C4-5: Small disc osteophyte complex with right greater than left uncovertebral and facet hypertrophy. Mild right foraminal stenosis. Disc contact on anterior cord with mild cord flattening. No significant canal stenosis. C5-6: Tiny central disc protrusion. No significant foraminal or canal stenosis.  C6-7: No significant disc displacement, foraminal stenosis, or canal stenosis. C7-T1: No significant disc displacement, foraminal stenosis, or canal stenosis. MRI THORACIC SPINE FINDINGS Alignment:  Physiologic. Vertebrae: There several enhancing lesions within the posterior elements, T10, and right T12 vertebral body compatible with metastatic disease. Additionally there are several lesions within the visualized posterior ribs and left scapular tip. Mild loss of endplate height in the right aspect of superior T10 and T12 vertebral bodies right-sided enhancing metastasis compatible with pathologic fracture. Cord: Normal signal and morphology. No cord or leptomeningeal enhancement. No epidural mass. Paraspinal and other soft tissues: Negative. Disc levels: No significant foraminal or canal stenosis. MRI LUMBAR SPINE FINDINGS Segmentation:  Standard. Alignment:  Physiologic. Vertebrae: Moderate L3 vertebral body pathologic compression deformity stable from prior CT of chest abdomen and pelvis. Bony retropulsion is eccentric to the left with severe narrowing of the left neural foramen and mild canal stenosis. Additional enhancing lesion within the right sacral ala and the right ilium. Conus medullaris and cauda equina: Conus extends to the L1 level. Conus and cauda equina appear normal. Paraspinal and other soft tissues: Negative. Disc levels: Mild L4-5 disc bulge and lower lumbar facet hypertrophy. Mild bilateral L4-5 foraminal stenosis. No significant canal stenosis. IMPRESSION: 1. Multiple vertebral body, posterior element, rib, sacrum, and left scapula enhancing metastasis. 2. No epidural, leptomeningeal, or cord metastasis. 3. Pathologic mild C4, mild  T10, mild T12, and moderate L3 compression deformities. 4. Mild bony retropulsion of L3 vertebral body eccentric to left with mild canal and severe left L3-4 foraminal stenosis. 5. Cystic lesion in right posterior hypopharynx measuring up to 22 mm, direct  visualization recommended. Electronically Signed   By: Kristine Garbe M.D.   On: 10/27/2017 15:49   Mr Lumbar Spine W Wo Contrast  Result Date: 10/27/2017 CLINICAL DATA:  56 y/o  M; diffuse large B-cell lymphoma. EXAM: MRI CERVICAL, THORACIC AND LUMBAR SPINE WITHOUT AND WITH CONTRAST TECHNIQUE: Multiplanar and multiecho pulse sequences of the cervical spine, to include the craniocervical junction and cervicothoracic junction, and thoracic and lumbar spine, were obtained without intravenous contrast. COMPARISON:  10/19/2017 nuclear medicine scan. 10/19/2017 CT of the chest abdomen and pelvis. CONTRAST:  None. FINDINGS: MRI CERVICAL SPINE FINDINGS Alignment: Straightening of cervical lordosis, mild focal reversal of curvature at C4 level. No listhesis. Vertebrae: Enhancing lesions in the right posterior C2 elements, C4 vertebral body, and left C3 facet likely representing metastasis. Mild pathologic fracture of C4 vertebral body with 30% loss of height. Cord: Normal signal and morphology. No cord or leptomeningeal enhancement. No epidural mass. Posterior Fossa, vertebral arteries, paraspinal tissues: Peripherally enhancing cystic lesion in the right posterior hypopharynx anterior displacing the right aryepiglottic fold measuring 12 x 22 mm (series 31, image 20). Disc levels: C2-3: No significant disc displacement, foraminal stenosis, or canal stenosis. C3-4: No significant disc displacement, foraminal stenosis, or canal stenosis. Mild facet hypertrophy. C4-5: Small disc osteophyte complex with right greater than left uncovertebral and facet hypertrophy. Mild right foraminal stenosis. Disc contact on anterior cord with mild cord flattening. No significant canal stenosis. C5-6: Tiny central disc protrusion. No significant foraminal or canal stenosis. C6-7: No significant disc displacement, foraminal stenosis, or canal stenosis. C7-T1: No significant disc displacement, foraminal stenosis, or canal  stenosis. MRI THORACIC SPINE FINDINGS Alignment:  Physiologic. Vertebrae: There several enhancing lesions within the posterior elements, T10, and right T12 vertebral body compatible with metastatic disease. Additionally there are several lesions within the visualized posterior ribs and left scapular tip. Mild loss of endplate height in the right aspect of superior T10 and T12 vertebral bodies right-sided enhancing metastasis compatible with pathologic fracture. Cord: Normal signal and morphology. No cord or leptomeningeal enhancement. No epidural mass. Paraspinal and other soft tissues: Negative. Disc levels: No significant foraminal or canal stenosis. MRI LUMBAR SPINE FINDINGS Segmentation:  Standard. Alignment:  Physiologic. Vertebrae: Moderate L3 vertebral body pathologic compression deformity stable from prior CT of chest abdomen and pelvis. Bony retropulsion is eccentric to the left with severe narrowing of the left neural foramen and mild canal stenosis. Additional enhancing lesion within the right sacral ala and the right ilium. Conus medullaris and cauda equina: Conus extends to the L1 level. Conus and cauda equina appear normal. Paraspinal and other soft tissues: Negative. Disc levels: Mild L4-5 disc bulge and lower lumbar facet hypertrophy. Mild bilateral L4-5 foraminal stenosis. No significant canal stenosis. IMPRESSION: 1. Multiple vertebral body, posterior element, rib, sacrum, and left scapula enhancing metastasis. 2. No epidural, leptomeningeal, or cord metastasis. 3. Pathologic mild C4, mild T10, mild T12, and moderate L3 compression deformities. 4. Mild bony retropulsion of L3 vertebral body eccentric to left with mild canal and severe left L3-4 foraminal stenosis. 5. Cystic lesion in right posterior hypopharynx measuring up to 22 mm, direct visualization recommended. Electronically Signed   By: Kristine Garbe M.D.   On: 10/27/2017 15:49   Nm Bone Scan Whole Body  Result Date:  10/19/2017 CLINICAL DATA:  55 year old male for restaging of diffuse large B-cell lymphoma. EXAM: NUCLEAR MEDICINE WHOLE BODY BONE SCAN TECHNIQUE: Whole body anterior and posterior images were obtained approximately 3 hours after intravenous injection of radiopharmaceutical. RADIOPHARMACEUTICALS:  21.0 mCi Technetium-19mMDP IV COMPARISON:  10/03/2017 chest CT and prior studies. No prior bone scans available for comparison. FINDINGS: Focal areas of increased activity are noted within the right shoulder, multiple bilateral ribs, right aspect of T10 and T12, throughout compressed L3 vertebra and right sacrum are compatible with metastatic disease. Focal increased activity overlying the right maxilla may represent dental disease but correlate clinically. No other significant abnormalities are noted. IMPRESSION: Multiple bony metastases as described. Focal increased activity overlying the right maxilla may represent dental disease or possibly metastasis. Electronically Signed   By: JMargarette CanadaM.D.   On: 10/19/2017 15:30   Ct Abdomen Pelvis W Contrast  Result Date: 10/19/2017 CLINICAL DATA:  Diffuse large B-cell lymphoma. EXAM: CT CHEST, ABDOMEN, AND PELVIS WITH CONTRAST TECHNIQUE: Multidetector CT imaging of the chest, abdomen and pelvis was performed following the standard protocol during bolus administration of intravenous contrast. CONTRAST:  1063mISOVUE-300 IOPAMIDOL (ISOVUE-300) INJECTION 61%, 3060mSOVUE-300 IOPAMIDOL (ISOVUE-300) INJECTION 61% COMPARISON:  Chest CT 10/03/2017 and prior CT scans 07/30/2017 FINDINGS: CT CHEST FINDINGS Cardiovascular: The heart is normal in size. No pericardial effusion. Stable mild tortuosity and ectasia of the thoracic aorta. Minimal atherosclerotic calcifications. The branch vessels are patent. No definite coronary artery calcifications. Mediastinum/Nodes: No mediastinal or hilar mass or lymphadenopathy. The esophagus is grossly normal. Lungs/Pleura: Patchy interstitial  changes and underlying emphysema. Biapical pleural and parenchymal scarring. Stable small nodular lesion in the left upper lobe adjacent to the mediastinum on image number 9 which measures 5.5 mm. Persistent areas of subpleural atelectasis but no new pulmonary lesions or acute superimposed pulmonary findings. Musculoskeletal: Much improved left chest wall mass this measured approximately 4.8 x 3.4 cm on the prior CT scan from July 30, 2017 and now measures 3.0 x 1.0 cm. Stable destructive bone lesions but no new or progressive findings involving the ribs or scapula. Small lytic lesions are noted in the right humeral head which appears stable. CT ABDOMEN PELVIS FINDINGS Hepatobiliary: No focal hepatic lesions or intrahepatic biliary dilatation. The gallbladder is mildly distended but no definite gallstones or findings for acute cholecystitis. No common bile duct dilatation. Pancreas: No mass, inflammation or ductal dilatation. Spleen: Normal size.  No focal lesions. Adrenals/Urinary Tract: The adrenal glands and left kidney are unremarkable. There is a right-sided nephrostomy tube in place. Small right renal calculi are noted. There are 2 adjacent calculi in the upper lobe right ureter measuring E total a maximum of 7.5 mm. Moderate right-sided hydroureteronephrosis. No distal ureteral calculi or bladder calculi. No bladder mass. There is a Foley catheter in the bladder. Stomach/Bowel: The stomach, duodenum, small bowel and colon are unremarkable. No acute inflammatory changes, mass lesions or obstructive findings. Vascular/Lymphatic: The aorta and branch vessels are patent. The major venous structures are patent. Left-sided retroperitoneal nodal mass seen on the prior study the adjacent to the aorta measured 3.1 x 1.7 cm. It now has the appearance of matted low-attenuation soft tissue density measuring approximately 23 x 8 mm. No new mesenteric or retroperitoneal lymph nodes. No pelvic adenopathy. No inguinal  adenopathy. Left para-aortic lymph node on the prior study measured 18 x 12 mm and is now matted soft tissue density measuring 11 x 5 mm. Reproductive: The prostate gland and  seminal vesicles are unremarkable. Other: None Musculoskeletal: Diffuse osseous involvement is again demonstrated but findings overall improved. The soft tissue mass associated with the destructive bony lesion involving the right iliac crest has resolved. The right iliac crest bone lesion appears stable. Right iliac bone lesion appears smaller and slightly calcified. The right pubic bone lesion has improved. There is healing changes, calcification and no residual soft tissue component. Stable small lytic lesions in the right femoral head. Right acetabular lesion appears stable. New line stable vertebral plana at L3 and stable appearing involvement of T10 and T12. IMPRESSION: 1. Overall improved CT appearance of the patient's lymphoma most significantly involving the osseous structures. No new or progressive bony findings. 2. Significant improvement of the retroperitoneal lymph nodes. No new or progressive findings in the abdomen or pelvis. 3. Right-sided externalized nephrostomy tube with obstructing upper ureteral calculi and associated mild to moderate hydroureteronephrosis. 4. Stable lung findings. Electronically Signed   By: Marijo Sanes M.D.   On: 10/19/2017 15:37   Ir Gastrostomy Tube Mod Sed  Result Date: 11/02/2017 CLINICAL DATA:  Large B-cell lymphoma and severe protein calorie nutrition. Request for percutaneous gastrostomy tube placement in order to supplement nutrition. EXAM: PERCUTANEOUS GASTROSTOMY TUBE PLACEMENT ANESTHESIA/SEDATION: 2.5 mg IV Versed; 100 mcg IV Fentanyl. Total Moderate Sedation Time 39 minutes. The patient's level of consciousness and physiologic status were continuously monitored during the procedure by Radiology nursing. CONTRAST:  22m ISOVUE-300 IOPAMIDOL (ISOVUE-300) INJECTION 61% MEDICATIONS: 2 g IV  Ancef. IV antibiotic was administered in an appropriate time interval prior to needle puncture of the skin. During the procedure the patient also received 0.5 mg IV glucagon. FLUOROSCOPY TIME:  3 minutes and 42 seconds.  16.4 mGy. PROCEDURE: The procedure, risks, benefits, and alternatives were explained to the patient. Questions regarding the procedure were encouraged and answered. The patient understands and consents to the procedure. A time-out was performed prior to initiating the procedure. The evening prior to the procedure, the patient was given thin liquid barium to ingest in order to opacify the colon. A 5-French catheter was then advanced through the the patient's mouth under fluoroscopy into the esophagus and to the level of the stomach. This catheter was used to insufflate the stomach with air under fluoroscopy. The abdominal wall was prepped with Betadine in a sterile fashion, and a sterile drape was applied covering the operative field. A sterile gown and sterile gloves were used for the procedure. Local anesthesia was provided with 1% Lidocaine. A skin incision was made in the upper abdominal wall. Under fluoroscopy, an 18 gauge trocar needle was advanced into the stomach. Contrast injection was performed to confirm intraluminal position of the needle tip. A single T tack was then deployed in the lumen of the stomach. This was brought up to tension at the skin surface. Over a guidewire, a 9-French sheath was advanced into the lumen of the stomach. The wire was left in place as a safety wire. A loop snare device from a percutaneous gastrostomy kit was then advanced into the stomach. A floppy guide wire was advanced through the orogastric catheter under fluoroscopy in the stomach. The loop snare advanced through the percutaneous gastric access was used to snare the guide wire. This allowed withdrawal of the loop snare out of the patient's mouth by retraction of the orogastric catheter and wire. A  20-French bumper retention gastrostomy tube was looped around the snare device. It was then pulled back through the patient's mouth. The retention bumper was brought  up to the anterior gastric wall. The T tack suture was cut at the skin. The exiting gastrostomy tube was cut to appropriate length and a feeding adapter applied. The catheter was injected with contrast material to confirm position and a fluoroscopic spot image saved. The tube was then flushed with saline. A dressing was applied over the gastrostomy exit site. COMPLICATIONS: None. FINDINGS: Initial fluoroscopy demonstrates adequate opacification of the colon by ingested barium in order to prevent colonic injury during the procedure. The stomach distended well with air allowing safe placement of the gastrostomy tube. After placement, the tip of the gastrostomy tube lies in the body of the stomach. IMPRESSION: Percutaneous gastrostomy with placement of a 20-French bumper retention tube in the body of the stomach. This tube can be used for percutaneous feeds beginning in 24 hours after placement. Electronically Signed   By: Aletta Edouard M.D.   On: 11/02/2017 10:04   US Renal  Result Date: 10/16/2017 CLINICAL DATA:  Blood draining from the nephrostomy tube EXAM: RENAL / URINARY TRACT ULTRASOUND COMPLETE COMPARISON:  Fluoro spot radiographs of October 04, 2017 revealing a right nephrostomy and contrast in the right ureter. FINDINGS: Right Kidney: Length: 10.4 cm. The nephrostomy tube's pigtail can be seen within the mid renal collecting system. There is no hydronephrosis. No perinephric fluid collection is observed. The cortical echotexture is remains lower than that of the adjacent liver. Left Kidney: Length: 11.5 cm. Echogenicity within normal limits. No mass or hydronephrosis visualized. Bladder: The urinary bladder is decompressed by a Foley catheter. IMPRESSION: No etiology for the bloody nephrostomy tube drainage is observed. The proximal  pigtail of the nephrostomy tube appears to lie in the mid renal collecting system. No hydronephrosis on the right or left. Electronically Signed   By: David  Martinique M.D.   On: 10/16/2017 13:54   Ir Chancy Milroy Darius Bump Per W/fl Mod Sed  Result Date: 11/09/2017 INDICATION: 56 year old with malnutrition. Gastrostomy tube was placed on 11/02/2017. Patient had an aspiration episode and needs conversion to a GJ tube. EXAM: CONVERSION OF GASTROSTOMY TUBE TO GJ TUBE WITH FLUOROSCOPY MEDICATIONS: None ANESTHESIA/SEDATION: None CONTRAST:  20 mL Isovue-300 FLUOROSCOPY TIME:  Fluoroscopy Time: 1 minutes and 54 seconds, 10 mGy COMPLICATIONS: None immediate. PROCEDURE: Patient was placed supine on the interventional table. The gastrostomy tube and surrounding skin were prepped and draped in sterile fashion. Maximal barrier sterile technique was utilized including caps, mask, sterile gowns, sterile gloves, sterile drape, hand hygiene and skin antiseptic. The existing G-tube was cut short. A 5 French Cobra catheter was advanced through the 20 [place] gastrostomy tube into the stomach. Contrast was injected into the stomach. The Cobra catheter was advanced into the duodenum with a stiff Glidewire. Catheter and wire were advanced into the distal duodenum. The Cobra catheter was removed and exchanged for the 9 French jejunal tube. The J tube was placed over the wire and the tip was placed in the distal duodenum. Contrast injection confirmed placement in the distal duodenum. Contrast was injected through the gastric port to confirm placement in the stomach. Fluoroscopic images were taken and saved for this procedure. FINDINGS: J tube is in the distal duodenum. IMPRESSION: Successful conversion of the gastrostomy tube to a GJ tube. The tube is ready to be used. Electronically Signed   By: Markus Daft M.D.   On: 11/09/2017 17:02   Dg Chest Port 1 View  Result Date: 11/06/2017 CLINICAL DATA:  Cough. EXAM: PORTABLE CHEST 1  VIEW COMPARISON:  CT  10/19/2017.  Chest x-ray 10/03/2017. FINDINGS: PowerPort catheter in stable position. Mediastinum hilar structures are normal. Lungs are clear of acute infiltrates. Stable mild left base pleural-parenchymal thickening consistent scarring. No focal infiltrate. Reference is made to prior chest CT report for discussion of tiny pulmonary nodule in the left lung. No prominent pleural effusion. No pneumothorax. Stable left posterior-lateral third rib deformity. IMPRESSION: 1. PowerPort catheter in stable position. 2. No acute cardiopulmonary disease. Mild left base pleural-parenchymal scarring again noted. No mass lesion noted. Electronically Signed   By: Marcello Moores  Register   On: 11/06/2017 07:43    Microbiology: Recent Results (from the past 240 hour(s))  MRSA PCR Screening     Status: Abnormal   Collection Time: 11/06/17 11:26 AM  Result Value Ref Range Status   MRSA by PCR POSITIVE (A) NEGATIVE Final    Comment:        The GeneXpert MRSA Assay (FDA approved for NASAL specimens only), is one component of a comprehensive MRSA colonization surveillance program. It is not intended to diagnose MRSA infection nor to guide or monitor treatment for MRSA infections. RESULT CALLED TO, READ BACK BY AND VERIFIED WITH: KAUR,B. RN @1412  ON 12.04.18 BY COHEN,K   Culture, Urine     Status: Abnormal   Collection Time: 11/07/17 10:30 AM  Result Value Ref Range Status   Specimen Description URINE, CLEAN CATCH  Final   Special Requests NONE  Final   Culture (A)  Final    <10,000 COLONIES/mL INSIGNIFICANT GROWTH Performed at [facility], [address] [place], [place] [address]    Report Status 11/08/2017 FINAL  Final     Labs: Basic Metabolic Panel: Recent Labs  Lab 11/10/17 0335 11/11/17 0442 11/12/17 0335 11/13/17 0418 11/14/17 0332  NA 138 138 137 139 134*  K 3.6 3.5 3.6 3.9 4.0  CL 105 103 103 104 100*  CO2 27 29 29 29 28   GLUCOSE 93 101* 99 113* 104*  BUN 7 6  8 12 13   CREATININE <0.30* <0.30* <0.30* <0.30* <0.30*  CALCIUM 7.2* 7.6* 7.7* 8.1* 7.7*  MG 1.9 1.9 2.1 2.1 2.0  PHOS 2.9 2.6 2.9 3.7 3.7   Liver Function Tests: Recent Labs  Lab 11/08/17 0416 11/09/17 0441 11/10/17 0335 11/11/17 0442 11/12/17 0335 11/13/17 0418 11/14/17 0332  AST 23 22  --  22  --  16 21  ALT 18 19  --  23  --  20 21  ALKPHOS 104 111  --  102  --  93 96  BILITOT 0.8 0.5  --  0.6  --  0.6 0.5  PROT 4.4* 4.6*  --  4.7*  --  4.9* 4.8*  ALBUMIN 1.9* 2.2* 2.1* 2.2* 2.3* 2.3* 2.3*    Recent Labs  Lab 11/10/17 0335 11/11/17 0442 11/12/17 0335 11/13/17 0418 11/14/17 0332  WBC 4.3 4.5 7.1 5.8 11.0*  NEUTROABS 3.5  --   --   --   --   HGB 8.5* 9.1* 9.0* 8.9* 9.1*  HCT 26.6* 28.6* 28.4* 28.0* 28.5*  MCV 84.7 84.6 84.3 84.3 84.3  PLT 150 206 249 297 376    BNP (last 3 results) Recent Labs    08/21/17 2249  BNP 607.1*    CBG: Recent Labs  Lab 11/14/17 0001 11/14/17 0429 11/14/17 0741 11/14/17 1205 11/14/17 1642  GLUCAP 129* 106* 106* 116* 124*     Signed:  Barton Dubois MD.  [facility] 11/14/2017, 4:50 PM

## 2017-11-14 NOTE — NC FL2 (Signed)
[facility] LEVEL OF CARE SCREENING TOOL     IDENTIFICATION  Patient Name: Cory Ibarra Birthdate: [birth year] Sex: male Admission Date (Current Location): 10/16/2017  [facility] and [place] Number:  Herbalist and Address:  [facility],  [address], [place]      Provider Number: [id number]  Attending Physician Name and Address:  Barton Dubois, MD  Relative Name and Phone Number:       Current Level of Care: Hospital Recommended Level of Care: [place] Prior Approval Number:    Date Approved/Denied:   PASRR Number: [id number]  Discharge Plan: SNF    Current Diagnoses: Patient Active Problem List   Diagnosis Date Noted  . Aspiration pneumonia due to regurgitated food ([place])   . Goals of care, counseling/discussion   . Sinus tachycardia 11/06/2017  . Lymphoma ([place]) 11/06/2017  . Lytic bone lesions on xray 11/06/2017  . Pressure injury of skin 10/04/2017  . Dehydration   . Hypoxia   . Pulmonary emboli ([place]) 10/03/2017  . Healthcare-associated pneumonia   . Obstructive uropathy   . Liver function test abnormality   . Renal calculus or stone 09/19/2017  . Leakage of nephrostomy catheter ([place]) 09/19/2017  . Nephrostomy status ([place]) 09/19/2017  . Gastroesophageal reflux disease without esophagitis 09/19/2017  . Hypokalemia 09/18/2017  . Hypomagnesemia 09/18/2017  . Electrolyte abnormality   . Hypovolemia 09/17/2017  . Chemotherapy-induced nausea 09/17/2017  . Constipation 09/17/2017  . 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 Hypocalcemia 09/17/2017  . Severe protein-calorie malnutrition ([place])   . Adjustment disorder with mixed anxiety and depressed mood   . Palliative care encounter   . Admission for antineoplastic chemotherapy   . Septic shock due to Escherichia coli ([place])   . Neutropenia ([place]) 08/21/2017  . Gross hematuria   . Lactic acidosis   . Neutropenic sepsis ([place])   .  Hypophosphatemia 08/20/2017  . Diffuse large B cell lymphoma ([place]) 07/03/2017  . Bony metastasis ([place]) 06/29/2017  . Chest wall mass   . 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 Closed compression fracture of L3 lumbar vertebra ([place]) 06/28/2017  . Severe back pain 06/28/2017  . Weight loss, unintentional 06/28/2017    Orientation RESPIRATION BLADDER Height & Weight     Self, Time, Situation, Place  Normal External catheter Weight: 123 lb 8 oz (56 kg) Height:  6\' 2"  (188 cm)  BEHAVIORAL SYMPTOMS/MOOD NEUROLOGICAL BOWEL NUTRITION STATUS      Incontinent, Continent Feeding tube  AMBULATORY STATUS COMMUNICATION OF NEEDS Skin   Extensive Assist Verbally PU Stage and Appropriate Care     Reason for Consult: Unstageable Pressure injury to the thoracic spine (full thickness).  Patient is insistent that this is not a new PrI, that it was incurred during his Rehab stay. I did not see this wound during my previous assessment on 11/10/17, but that could have been due to his positioning Wound type:Pressure Pressure Injury POA: Yes Measurement:2cm x 1.5cm with depth unable to be determined due ot the presence of yellow/white slough covering the wound bed. Wound bed:As described above Drainage (amount, consistency, odor) small amount of white/light yellow exudate on silicone dressing consistent with autolytically debriding slough. Periwound: two circular ulcerations are located in the periwound at 4 and 5 o'clock, full thickness.  Both measure 0.4cm round x 0.1cm deep with pink wound beds, scant exudate. Dressing procedure/placement/frequency: I have ammended the previous wound care orders to include this mid thoracic area.  Santyl (collagenase) ointment topped with a saline moistened gauze dressing, followed by a dry gauze dressing and topped with silicone foam is appropriate.  Of course, patient would improve more quickly if he would agree to a mattress replacement with low air loss feature or decrease the  HOB to below a 30 degree angle or finally, turn side to side instead of lying in the supine position, but he chooses not to. He is pleasant and appreciative for my visit today.                      Personal Care Assistance Level of Assistance  Bathing, Feeding, Dressing Bathing Assistance: Limited assistance Feeding assistance: Independent Dressing Assistance: Limited assistance     Functional Limitations Info  Sight, Hearing, Speech Sight Info: Adequate Hearing Info: Adequate Speech Info: Adequate    SPECIAL CARE FACTORS FREQUENCY  PT (By licensed PT), OT (By licensed OT)     PT Frequency: 5x/week OT Frequency: 5x/week            Contractures Contractures Info: Not present    Additional Factors Info  Code Status, Allergies, Isolation Precautions Code Status Info: DNR Allergies Info: Bee Venom     Isolation Precautions Info: MRSA     Current Medications (11/14/2017):  This is the current hospital active medication list Current Facility-Administered Medications  Medication Dose Route Frequency Provider Last Rate Last Dose  . acetaminophen (TYLENOL) tablet 650 mg  650 mg Oral Q6H PRN Donne Hazel, MD       Or  . acetaminophen (TYLENOL) suppository 650 mg  650 mg Rectal Q6H PRN Donne Hazel, MD      . apixaban [facility]) tablet 5 mg  5 mg Oral BID Lynelle Doctor, RPH   5 mg at 11/14/17 0920  . calcium carbonate (TUMS - dosed in mg elemental calcium) chewable tablet 1,000 mg  1,000 mg Oral TID Eber , MD   1,000 mg at 11/12/17 1652  . collagenase (SANTYL) ointment   Topical Daily Elodia Florence., MD      . doxycycline (VIBRA-TABS) tablet 100 mg  100 mg Oral BID Elodia [place]., MD   100 mg at 11/14/17 6283  . feeding supplement (OSMOLITE 1.5 CAL) liquid 1,000 mL  1,000 mL Per Tube Continuous Elodia [place]., MD 60 mL/hr at 11/14/17 0949 1,000 mL at 11/14/17 0949  . feeding supplement (PRO-STAT SUGAR FREE 64) liquid 30 mL  30 mL  Per Tube Daily Elodia Florence., MD   30 mL at 11/13/17 0951  . iopamidol (ISOVUE-300) 61 % injection 15 mL  15 mL Oral Once PRN Eber , MD   30 mL at 10/19/17 0900  . labetalol (NORMODYNE,TRANDATE) injection 5 mg  5 mg Intravenous Q2H PRN Donne Hazel, MD      . LORazepam (ATIVAN) tablet 0.5 mg  0.5 mg Oral Q6H PRN Donne Hazel, MD   0.5 mg at 11/09/17 2123  . magnesium oxide (MAG-OX) tablet 400 mg  400 mg Oral BID Donne Hazel, MD   400 mg at 11/14/17 0920  . metoCLOPramide (REGLAN) injection 10 mg  10 mg Intravenous Q6H Domingo Cocking, Gene, MD   10 mg at 11/11/17 0631   Or  . metoCLOPramide (REGLAN) tablet 10 mg  10 mg Oral Q6H Micheline Rough, MD   10 mg at 11/14/17 0450  . metoprolol tartrate (LOPRESSOR) injection 2.5 mg  2.5 mg Intravenous Q6H  PRN Charlynne Cousins, MD      . metoprolol tartrate (LOPRESSOR) tablet 12.5 mg  12.5 mg Oral BID Charlynne Cousins, MD   12.5 mg at 11/14/17 0920  . multivitamin liquid 15 mL  15 mL Oral Daily Eber Aissata Wilmore, MD   15 mL at 11/14/17 1017  . OLANZapine zydis (ZYPREXA) disintegrating tablet 5 mg  5 mg Oral QHS Micheline Rough, MD   5 mg at 11/13/17 2202  . oxyCODONE (Oxy IR/ROXICODONE) immediate release tablet 10 mg  10 mg Oral Q4H PRN Donne Hazel, MD   10 mg at 11/14/17 0449  . oxyCODONE (OXYCONTIN) 12 hr tablet 10 mg  10 mg Oral Q12H Donne Hazel, MD   10 mg at 11/14/17 0920  . pantoprazole (PROTONIX) EC tablet 40 mg  40 mg Oral Q lunch Donne Hazel, MD   40 mg at 11/13/17 1214  . potassium & sodium phosphates (PHOS-NAK) 280-160-250 MG packet 1 packet  1 packet Oral TID AC & HS Eber Asja Frommer, MD   1 packet at 11/14/17 0920  . predniSONE (DELTASONE) tablet 20 mg  20 mg Oral Q breakfast Donne Hazel, MD   20 mg at 11/14/17 6256  . sodium chloride flush (NS) 0.9 % injection 10-40 mL  10-40 mL Intracatheter PRN Donne Hazel, MD   20 mL at 11/14/17 0339  . sulfamethoxazole-trimethoprim (BACTRIM,SEPTRA)  400-80 MG per tablet 2 tablet  2 tablet Oral Once per day on Mon Wed Fri Eber Tonya Wantz, MD   2 tablet at 11/14/17 3893  . Vitamin D (Ergocalciferol) (DRISDOL) capsule 50,000 Units  50,000 Units Oral Q Norwood Levo, MD   50,000 Units at 11/12/17 1010   Facility-Administered Medications Ordered in Other Encounters  Medication Dose Route Frequency Provider Last Rate Last Dose  . sodium chloride flush (NS) 0.9 % injection 10 mL  10 mL Intracatheter PRN Ardath Sax, MD   10 mL at 08/09/17 1702     Discharge Medications: Please see discharge summary for a list of discharge medications.  Relevant Imaging Results:  Relevant Lab Results:   Additional Information SS#: [id number]  Servando Snare, LCSW

## 2017-11-14 NOTE — Progress Notes (Signed)
   11/14/17 1643  Clinical Encounter Type  Visited With Patient  Visit Type Follow-up  Spiritual Encounters  Spiritual Needs Prayer   Patient shared he will be going back to [place] rehab soon.  He seems to be in a better place in terms of being realistic about what might be next.  He still wants to get better and stronger.  He is thankful for the care he has received.  We prayed together. Will follow as needed. Chaplain Katherene Ponto

## 2017-11-14 NOTE — Progress Notes (Signed)
Physical Therapy Treatment Patient Details Name: Ranvir Renovato MRN: [id number] DOB: 03-Jun-1961 Today's Date: 11/14/2017    History of Present Illness 56 year old male with recently diagnosed lymphoma, s/p 2 cycles of chemo and underwent XRT, right ureteral stone and s/p nephrostomy 10/04/17, recent PE and now on apixaban. He present to ED with hematuria through nephrostomy tube. Hematuria spontaneously resolved. He was however found to have multiple electrolyte derangements. In addition, his nutritional status is very poor. Compression deformities at C4, T10, T12, L3.     PT Comments    Progressing slowly with mobility. No dizziness this session however pt has become very weak during this hospital stay. He refused to wear TLSO due to his feeding tube. Attempted standing but pt was unable with +1 assist and a RW. Will continue to follow and progress activity as tolerated. Pt will need rehab at the SNF.    Follow Up Recommendations  SNF     Equipment Recommendations  None recommended by PT    Recommendations for Other Services       Precautions / Restrictions Precautions Precautions: Fall Precaution Comments: dizziness Spinal Brace: Thoracolumbosacral orthotic (pt is currently refusing to wear due to feeding tube) Restrictions Weight Bearing Restrictions: No    Mobility  Bed Mobility Overal bed mobility: Needs Assistance Bed Mobility: Supine to Sit;Sit to Supine     Supine to sit: HOB elevated;Mod assist Sit to supine: Mod assist   General bed mobility comments: Increased time. Assist for trunk and LEs. Pt relied on bedrails.   Transfers Overall transfer level: Needs assistance Equipment used: Rolling walker (2 wheeled) Transfers: Sit to/from Stand;Lateral/Scoot Transfers Sit to Stand: Max assist        Lateral/Scoot Transfers: Min assist General transfer comment: Attempted x 3. Pt able to clear bottom on 3rd attempt but still unable to get to standing with +1 assist and  RW. LEs are very weak. May consider trying STEDY and +2 assist next session.  Lateral soot x 3 towards HOB.   Ambulation/Gait             General Gait Details: currently unable due to weakness   Stairs            Wheelchair Mobility    Modified Rankin (Stroke Patients Only)       Balance                                            Cognition Arousal/Alertness: Awake/alert Behavior During Therapy: WFL for tasks assessed/performed Overall Cognitive Status: Within Functional Limits for tasks assessed                                 General Comments: pt is  very particular about how he wants to do things      Exercises General Exercises - Upper Extremity Shoulder Flexion: AROM;Both;Supine;10 reps;Seated General Exercises - Lower Extremity Ankle Circles/Pumps: AROM;Both;15 reps;Seated Long Arc Quad: AROM;Both;10 reps;Seated Hip Flexion/Marching: AROM;Both;Seated;10 reps(limited ROM)    General Comments        Pertinent Vitals/Pain Pain Assessment: 0-10 Pain Score: 5  Pain Location: back Pain Descriptors / Indicators: Discomfort;Aching;Sore Pain Intervention(s): Limited activity within patient's tolerance;Repositioned    Home Living  Prior Function            PT Goals (current goals can now be found in the care plan section) Progress towards PT goals: Progressing toward goals(slowly)    Frequency    Min 3X/week      PT Plan Current plan remains appropriate    Co-evaluation              AM-PAC PT "6 Clicks" Daily Activity  Outcome Measure  Difficulty turning over in bed (including adjusting bedclothes, sheets and blankets)?: Unable Difficulty moving from lying on back to sitting on the side of the bed? : Unable Difficulty sitting down on and standing up from a chair with arms (e.g., wheelchair, bedside commode, etc,.)?: Unable Help needed moving to and from a bed to chair  (including a wheelchair)?: Total Help needed walking in hospital room?: Total Help needed climbing 3-5 steps with a railing? : Total 6 Click Score: 6    End of Session   Activity Tolerance: Patient tolerated treatment well Patient left: in bed;with call bell/phone within reach;with bed alarm set   PT Visit Diagnosis: Muscle weakness (generalized) (M62.81);Difficulty in walking, not elsewhere classified (R26.2)     Time: 8937-3428 PT Time Calculation (min) (ACUTE ONLY): 21 min  Charges:  $Therapeutic Activity: 8-22 mins                    G Codes:         Weston Anna, MPT Pager: [phone]

## 2017-11-14 NOTE — Discharge Instructions (Signed)
Gastrostomy Tube Home Guide, Adult  A gastrostomy tube is a tube that is surgically placed into the stomach. It is also called a “G-tube.” G-tubes are used when a person is unable to eat and drink enough on their own to stay healthy. The tube is inserted into the stomach through a small cut (incision) in the skin. This tube is used for:  · Feeding.  · Giving medication.    Gastrostomy tube care  · Wash your hands with soap and water.  · Remove the old dressing (if any). Some styles of G-tubes may need a dressing inserted between the skin and the G-tube. Other types of G-tubes do not require a dressing. Ask your health care provider if a dressing is needed.  · Check the area where the tube enters the skin (insertion site) for redness, swelling, or pus-like (purulent) drainage. A small amount of clear or tan liquid drainage is normal. Check to make sure scar tissue (skin) is not growing around the insertion site. This could have a raised, bumpy appearance.  · A cotton swab can be used to clean the skin around the tube:  ? When the G-tube is first put in, a normal saline solution or water can be used to clean the skin.  ? Mild soap and warm water can be used when the skin around the G-tube site has healed.  ? Roll the cotton swab around the G-tube insertion site to remove any drainage or crusting at the insertion site.  Stomach residuals  Feeding tube residuals are the amount of liquids that are in the stomach at any given time. Residuals may be checked before giving feedings, medications, or as instructed by your health care provider.  · Ask your health care provider if there are instances when you would not start tube feedings depending on the amount or type of contents withdrawn from the stomach.  · Check residuals by attaching a syringe to the G-tube and pulling back on the syringe plunger. Note the amount, and return the residual back into the stomach.    Flushing the G-tube  · The G-tube should be periodically  flushed with clean warm water to keep it from clogging.  ? Flush the G-tube after feedings or medications. Draw up 30 mL of warm water in a syringe. Connect the syringe to the G-tube and slowly push the water into the tube.  ? Do not push feedings, medications, or flushes rapidly. Flush the G-tube gently and slowly.  ? Only use syringes made for G-tubes to flush medications or feedings.  ? Your health care provider may want the G-tube flushed more often or with more water. If this is the case, follow your health care provider's instructions.  Feedings  Your health care provider will determine whether feedings are given as a bolus (a certain amount given at one time and at scheduled times) or whether feedings will be given continuously on a feeding pump.  · Formulas should be given at room temperature.  · If feedings are continuous, no more than 4 hours worth of feedings should be placed in the feeding bag. This helps prevent spoilage or accidental excess infusion.  · Cover and place unused formula in the refrigerator.  · If feedings are continuous, stop the feedings when medications or flushes are given. Be sure to restart the feedings.  · Feeding bags and syringes should be replaced as instructed by your health care provider.    Giving medication  · In general, it   is best if all medications are in a liquid form for G-tube administration. Liquid medications are less likely to clog the G-tube.  ? Mix the liquid medication with 30 mL (or amount recommended by your health care provider) of warm water.  ? Draw up the medication into the syringe.  ? Attach the syringe to the G-tube and slowly push the mixture into the G-tube.  ? After giving the medication, draw up 30 mL of warm water in the syringe and slowly flush the G-tube.  · For pills or capsules, check with your health care provider first before crushing medications. Some pills are not effective if they are crushed. Some capsules are sustained-release  medications.  ? If appropriate, crush the pill or capsule and mix with 30 mL of warm water. Using the syringe, slowly push the medication through the tube, then flush the tube with another 30 mL of tap water.  G-tube problems  G-tube was pulled out.  · Cause: May have been pulled out accidentally.  · Solutions: Cover the opening with clean dressing and tape. Call your health care provider right away. The G-tube should be put in as soon as possible (within 4 hours) so the G-tube opening (tract) does not close. The G-tube needs to be put in at a health care setting. An X-ray needs to be done to confirm placement before the G-tube can be used again.    Redness, irritation, soreness, or foul odor around the gastrostomy site.  · Cause: May be caused by leakage or infection.  · Solutions: Call your health care provider right away.    Large amount of leakage of fluid or mucus-like liquid present (a large amount means it soaks clothing).  · Cause: Many reasons could cause the G-tube to leak.  · Solutions: Call your health care provider to discuss the amount of leakage.    Skin or scar tissue appears to be growing where tube enters skin.  · Cause: Tissue growth may develop around the insertion site if the G-tube is moved or pulled on excessively.  · Solutions: Secure tube with tape so that excess movement does not occur. Call your health care provider.    G-tube is clogged.  · Cause: Thick formula or medication.  · Solutions: Try to slowly push warm water into the tube with a large syringe. Never try to push any object into the tube to unclog it. Do not force fluid into the G-tube. If you are unable to unclog the tube, call your health care provider right away.    Tips  · Head of bed (HOB) position refers to the upright position of a person's upper body.  ? When giving medications or a feeding bolus, keep the HOB up as told by your health care provider. Do this during the feeding and for 1 hour after the feeding or  medication administration.  ? If continuous feedings are being given, it is best to keep the HOB up as told by your health care provider. When ADLs (activities of daily living) are performed and the HOB needs to be flat, be sure to turn the feeding pump off. Restart the feeding pump when the HOB is returned to the recommended height.  · Do not pull or put tension on the tube.  · To prevent fluid backflow, kink the G-tube before removing the cap or disconnecting a syringe.  · Check the G-tube length every day. Measure from the insertion site to the end of the G-tube. If   the length is longer than previous measurements, the tube may be coming out. Call your health care provider if you notice increasing G-tube length.  · Oral care, such as brushing teeth, must be continued.  · You may need to remove excess air (vent) from the G-tube. Your health care provider will tell you if this is needed.  · Always call your health care provider if you have questions or problems with the G-tube.  Get help right away if:  · You have severe abdominal pain, tenderness, or abdominal bloating (distension).  · You have nausea or vomiting.  · You are constipated or have problems moving your bowels.  · The G-tube insertion site is red, swollen, has a foul smell, or has yellow or brown drainage.  · You have difficulty breathing or shortness of breath.  · You have a fever.  · You have a large amount of feeding tube residuals.  · The G-tube is clogged and cannot be flushed.  This information is not intended to replace advice given to you by your health care provider. Make sure you discuss any questions you have with your health care provider.  Document Released: 01/29/2002 Document Revised: 04/27/2016 Document Reviewed: 07/28/2013  Elsevier Interactive Patient Education © 2017 [employer]

## 2017-11-14 NOTE — Progress Notes (Signed)
Patient has discharge order to [facility]; RN called for report at 1700 to [place]; notified SW for PTAR set up.

## 2017-11-15 ENCOUNTER — Non-Acute Institutional Stay (SKILLED_NURSING_FACILITY): Payer: Medicaid Other | Admitting: Adult Health

## 2017-11-15 ENCOUNTER — Encounter: Payer: Self-pay | Admitting: Adult Health

## 2017-11-15 DIAGNOSIS — C7951 Secondary malignant neoplasm of bone: Secondary | ICD-10-CM | POA: Diagnosis not present

## 2017-11-15 DIAGNOSIS — N139 Obstructive and reflux uropathy, unspecified: Secondary | ICD-10-CM

## 2017-11-15 DIAGNOSIS — E878 Other disorders of electrolyte and fluid balance, not elsewhere classified: Secondary | ICD-10-CM

## 2017-11-15 DIAGNOSIS — K219 Gastro-esophageal reflux disease without esophagitis: Secondary | ICD-10-CM | POA: Diagnosis not present

## 2017-11-15 DIAGNOSIS — C8338 Diffuse large B-cell lymphoma, lymph nodes of multiple sites: Secondary | ICD-10-CM

## 2017-11-15 DIAGNOSIS — F4323 Adjustment disorder with mixed anxiety and depressed mood: Secondary | ICD-10-CM | POA: Diagnosis not present

## 2017-11-15 DIAGNOSIS — N200 Calculus of kidney: Secondary | ICD-10-CM | POA: Diagnosis not present

## 2017-11-15 DIAGNOSIS — S32030D Wedge compression fracture of third lumbar vertebra, subsequent encounter for fracture with routine healing: Secondary | ICD-10-CM | POA: Diagnosis not present

## 2017-11-15 DIAGNOSIS — E43 Unspecified severe protein-calorie malnutrition: Secondary | ICD-10-CM | POA: Diagnosis not present

## 2017-11-15 DIAGNOSIS — I2699 Other pulmonary embolism without acute cor pulmonale: Secondary | ICD-10-CM | POA: Diagnosis not present

## 2017-11-15 DIAGNOSIS — T451X5A Adverse effect of antineoplastic and immunosuppressive drugs, initial encounter: Secondary | ICD-10-CM

## 2017-11-15 DIAGNOSIS — R110 Nausea: Secondary | ICD-10-CM

## 2017-11-15 NOTE — Progress Notes (Signed)
Location:   [facility] Room Number: 131 A Place of Service:  SNF (31)   CODE STATUS: Full Code  Allergies  Allergen Reactions  . Bee Venom Swelling    Chief Complaint  Patient presents with  . Hospitalization Follow-up    Hospital Follow up    HPI:  He is a 56 year old resident of this facility who has lymphoma B-cell. He has a right nephrostomy stent which had bleeding present. He has severe malnutrition which has required him to have a J tube for nutritional support. He was changed from G tube to J tube due to aspiration.  He does have significant back pain due to metastatic disease; which he tells me at this time is being managed. He did suffer aspiration pneumonia and was treated from 12-4 through 11-10-17.  He denies any depression or anxiety at this time. And tells me that he is tolerating his feeding without issues.  There are nursing concerns at this time.   Past Medical History:  Diagnosis Date  . Cancer ([place])   . Tendonitis     Past Surgical History:  Procedure Laterality Date  . CYSTOSCOPY W/ URETERAL STENT PLACEMENT Right 08/21/2017   Procedure: CYSTOSCOPY WITH RETROGRADE PYELOGRAM, RIGHT;  Surgeon: Alexis Frock, MD;  Location: [facility];  Service: Urology;  Laterality: Right;  . HERNIA REPAIR  1988   Dr. Vida Rigger  . IR FLUORO GUIDE PORT INSERTION RIGHT  07/26/2017  . IR GASTR TUBE CONVERT GASTR-JEJ PER W/FL MOD SED  11/09/2017  . IR GASTROSTOMY TUBE MOD SED  11/02/2017  . IR NEPHROSTOMY EXCHANGE RIGHT  10/04/2017  . IR NEPHROSTOMY PLACEMENT RIGHT  08/21/2017  . IR US GUIDE VASC ACCESS RIGHT  07/26/2017    Social History   Socioeconomic History  . Marital status: Single    Spouse name: Not on file  . Number of children: 0  . Years of education: Not on file  . Highest education level: Not on file  Social Needs  . Financial resource strain: Not on file  . Food insecurity - worry: Not on file  . Food insecurity - inability: Not on file  .  Transportation needs - medical: Not on file  . Transportation needs - non-medical: Not on file  Occupational History  . Not on file  Tobacco Use  . Smoking status: Never Smoker  . Smokeless tobacco: Never Used  Substance and Sexual Activity  . Alcohol use: Yes    Comment: occasionally/socially  . Drug use: No  . Sexual activity: No  Other Topics Concern  . 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 Never used smokeless tobacco.   No e-cigarettes   Occasional alcohol consumption socially.   Single, no children.   Family History  Problem Relation Age of Onset  . Cancer Father       VITAL SIGNS BP 116/87   Pulse (!) 110   Temp 97.6 F (36.4 C)   Resp 16   Ht 6' 2"  (1.88 m)   Wt 140 lb 1.6 oz (63.5 kg)   SpO2 98%   BMI 17.99 kg/m   Outpatient Encounter Medications as of 11/15/2017  Medication Sig  . Amino Acids-Protein Hydrolys (FEEDING SUPPLEMENT, PRO-STAT SUGAR FREE 64,) LIQD Place 30 mLs into feeding tube daily.  Marland Kitchen apixaban (ELIQUIS) 5 MG TABS tablet Take 2 tabs po BID x7 days, then take 1 tab po BID  . [  START ON 11/22/2017] apixaban (ELIQUIS) 5 MG TABS tablet Take 5 mg by mouth 2 (two) times daily. Starting 11/22/17  . calcium elemental as carbonate (BARIATRIC TUMS ULTRA) 400 MG chewable tablet Chew 3 tablets (1,200 mg total) 3 (three) times daily by mouth.  . chlorhexidine (PERIDEX) 0.12 % solution 15 mLs by Mouth Rinse route 2 (two) times daily.  . Cholecalciferol 50000 units capsule Take 50,000 Units by mouth daily. Every Monday  . collagenase (SANTYL) ointment Apply topically daily. Applied to wounds in his sacral area and also thoracic spine once a day.  Marland Kitchen doxycycline (VIBRA-TABS) 100 MG tablet Take 1 tablet (100 mg total) by mouth 2 (two) times daily for 6 days.  Marland Kitchen dronabinol (MARINOL) 5 MG capsule Take 1 capsule (5 mg total) daily before lunch by mouth.  . Lidocaine-Prilocaine, Bulk, 2.5-2.5  % CREA Apply to affected areas topically one time daily  . LORazepam (ATIVAN) 0.5 MG tablet Take 1 tablet (0.5 mg total) by mouth every 6 (six) hours as needed for up to 3 days (Nausea or vomiting).  . magnesium oxide (MAG-OX) 400 (241.3 Mg) MG tablet Take 1 tablet (400 mg total) by mouth 2 (two) times daily.  . metoCLOPramide (REGLAN) 10 MG tablet Take 1 tablet (10 mg total) by mouth 3 (three) times daily before meals.  . metoprolol tartrate (LOPRESSOR) 25 MG tablet Take 0.5 tablets (12.5 mg total) by mouth 2 (two) times daily.  . Multiple Vitamin (MULTIVITAMIN) LIQD Take 15 mLs by mouth daily.  Marland Kitchen OLANZapine zydis (ZYPREXA) 5 MG disintegrating tablet Take 0.5 tablets (2.5 mg total) by mouth at bedtime.  Marland Kitchen oxyCODONE (OXYCONTIN) 10 mg 12 hr tablet Take 1 tablet (10 mg total) by mouth every 12 (twelve) hours.  Marland Kitchen oxyCODONE 10 MG TABS Take 1 tablet (10 mg total) by mouth every 6 (six) hours as needed (for pain).  . pantoprazole (PROTONIX) 40 MG tablet Take 40 mg by mouth daily with lunch.   . polyethylene glycol (MIRALAX / GLYCOLAX) packet Take 17 g daily as needed by mouth for moderate constipation.   . potassium & sodium phosphates (PHOS-NAK) 280-160-250 MG PACK Take 1 packet by mouth 4 (four) times daily -  before meals and at bedtime.  . predniSONE (DELTASONE) 10 MG tablet Take 2 tablets (20 mg total) daily with breakfast by mouth.  . prochlorperazine (COMPAZINE) 10 MG tablet Take 10 mg every 6 (six) hours as needed by mouth for nausea or vomiting.  . senna-docusate (SENOKOT-S) 8.6-50 MG tablet Take 1 tablet by mouth daily as needed for mild constipation.  Derrill Memo ON 11/16/2017] sulfamethoxazole-trimethoprim (BACTRIM,SEPTRA) 400-80 MG tablet Take 2 tablets by mouth 3 (three) times a week.  Derrill Memo ON 11/19/2017] Vitamin D, Ergocalciferol, (DRISDOL) 50000 units CAPS capsule Take 1 capsule (50,000 Units total) by mouth every Monday.  . [DISCONTINUED] Cholecalciferol 50000 units TABS Take 50,000  Units once a week by mouth. for 8 weeks, then return for level recheck (Patient not taking: Reported on 11/15/2017)  . [DISCONTINUED] Nutritional Supplements (FEEDING SUPPLEMENT, OSMOLITE 1.5 CAL,) LIQD Place 1,000 mLs into feeding tube continuous. (Patient not taking: Reported on 11/15/2017)   Facility-Administered Encounter Medications as of 11/15/2017  Medication  . sodium chloride flush (NS) 0.9 % injection 10 mL     SIGNIFICANT DIAGNOSTIC EXAMS  PREVIOUS:   08-21-17:chest x-ray: No active cardiopulmonary disease.  08-21-17: lumbar spine complete x-ray: There is again noted fracture L3 vertebral body. Tumor extends into and involves the left L3 pedicle. This finding  was present on prior lumbar MR. No new bone lesions are evident. No new fracture. No spondylolisthesis. There is facet osteoarthritic change at L4-5 L5-S1 bilaterally. There is also loss of the pedicle on the right at T12, indicative of bony metastatic disease in this area.  08-21-17: thoracic spine x-ray: No fracture or spondylolisthesis. There are multiple metastatic foci Seen in the thoracic spine performed three months prior. By radiography, there is loss of the pedicle of T12 on the right and subtle loss of the lateral aspect of the pedicle of T8 on the left. 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 No other neoplastic foci identified by radiography the bony structures. Note that there are multiple foci of tumor and thoracic vertebral bodies seen on recent MR not seen by radiography currently.   08-22-17: placement of right nephrostomy placement  10-03-17: chest x-ray: No active cardiopulmonary disease.   10-03-17: ct angio of chest:  1. Acute pulmonary embolism within the LEFT lower lobe and lingula. These small emboli are nonocclusive. Overall clot burden is minimal. 2. No evidence of RIGHT ventricular strain. 3. Patchy airspace disease in the LEFT lower lobe. Favor pulmonary infection such  as pneumonia or aspiration rather than pulmonary infarction. 4. Frothy material within the LEFT lower lobe bronchus could represent aspirate from tracheal aspiration.  10-04-17 right nephrostomy stent replacement   TODAY:  10-16-17: renal ultrasound:  No etiology for the bloody nephrostomy tube drainage is observed. The proximal pigtail of the nephrostomy tube appears to lie in the mid renal collecting system. No hydronephrosis on the right or left.  10-19-17: ct of neck:  1. No prior head or neck CT. 2. 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 Associated pathologic fractures of the right C1 lateral mass and the C4 vertebral body. 3. No lymphadenopathy in the neck. 4. There is a 2 cm circumscribed cyst in the posterior right hypopharynx contiguous with the right AE fold which appears to be new since the CTA chest 10/03/2017. Burtis Junes this is a benign retention cyst. 5. Right mastoid effusion, likely reactive to #2.  10-19-17: ct of chest abdomen and pelvis:  1. Overall improved CT appearance of the patient's lymphoma most significantly involving the osseous structures. No new or progressive bony findings. 2. Significant improvement of the retroperitoneal lymph nodes. No new or progressive findings in the abdomen or pelvis. 3. Right-sided externalized nephrostomy tube with obstructing upper ureteral calculi and associated mild to moderate hydroureteronephrosis. 4. Stable lung findings.  10-19-17: NM bone scan whole body: Multiple bony metastases as described. Focal increased activity overlying the right maxilla may represent dental disease or possibly metastasis.  10-27-17: MRI cervical; thoracic; lumbar spine:  1. Multiple vertebral body, posterior element, rib, sacrum, and left scapula enhancing metastasis. 2. No epidural, leptomeningeal, or cord metastasis. 3. Pathologic mild C4, mild T10, mild T12, and moderate L3 compression deformities. 4.  Mild bony retropulsion of L3 vertebral body eccentric to left with mild canal and severe left L3-4 foraminal stenosis. 5. Cystic lesion in right posterior hypopharynx measuring up to 22 mm, direct visualization recommended.    LABS REVIEWED: PREVIOUS:   08-21-17: wbc 0.1; hgb 9.8; hct 27.6; mcv 71.7; plt 76; glucose 144 bun 12; creat 0.63; k+ 3.5; na++ 135; ca 6.9; liver normal albumin 3.1; phos 1.9; BNP 607.1; uric acid 3.8 blood culture: enterobacteriaceae; e-coli 08-24-17: wbc 5.3; hgb 8.7; hct 249; mcv 73.7; plt 57; glucose 93; bun 11;  creat 0.38; k+ 2.7; na++ 139; ca 6.3; albumin 2.3 08-25-17: blood culture: no growth  08-27-17: wbc 13.4; hgb 8.7; hct 25.2; mcv 73.5; plt 63; glucose 92; bun 7; creat 0.31; k+ 3.0; na++ 140; ca 6.3; alt 73; albumin 2.3  08-30-17: wbc 6.2; hgb 9.0; hct 26.5; mcv 74.9; plt 120; glucose 109; bun <5' creat <0.30; k+ 3.4; na++ 135; ca 6.2; liver normal albumin 2.3; mag 1.6; phos 1.7 09-03-17: wbc 6.3; hgb 10.0; hct 29.5; mcv 74.1; plt 403; glucose 103; bun 8; creat 0.38; k+ 3.1; na++ 136; mag 1.7; phos 1.9  09-17-17: wbc 10.4; hgb 11.6; hct 35.4; mcv 75.0; plt 453; glucose 108; bun 7.3; creat 0.5; k+ 3.5; na++ 135; ca 6.4; total bili 1.26; albumin 2.4; uric acid 6.8; mag 1.6; phos 1.9 09-21-17: wbc 30.8; hgb 8.4; hct 26.0; mcv 78.3; plt 252; uric acid 1.9; mag 1.7; phos 1.6 09-24-17: wbc 13.7; hgb 8.0; hct 26.1; mcv 78.4; plt 145; glucose 87; bun <5; creat 0.30; k+ 4.3; na+= 137; ca 6.5; ast 103; alt 76; alk phos 195; albumin 2.2; uric acid 1.6; mag 1.7; phos 2.3; vit D 8.3 10-03-17: wbc 3.4; hgb 9.2; hct 29.9; mcv 80.8; plt 415; glucose 97; bun 10; creat 0.45; k+ 3.0; na++ 139; ca 6.9; indirect bili 1.3; alk phos 158 albumin 2.8; mag 2.0; phos 1.6; blood culture: no growth; urine culture: 50,000 e-coli 10-05-17: wbc 2.9; hgb 7.8; hct 24.6; mcv 80.7 ;plt 329; glucose 92; bun 9; creat 0.36; k+ 3.0; na++ 140; ca 6.4; mag 1.7; phos 1.8; ionized calcium 3.5 10-08-17: wbc 3.2; hgb  9.0; hct 28.6; mcv 81.7; plt 318; glucose 99; bun <5; creat 0.31; k+ 3.5; na++ 140; ca 6.9; liver normal albumin 2.3; mag 1.6; phos 2.4    TODAY:   10-16-17: wbc 9.6; hgb 11.2; hct 35.3; mcv 81.1; plt 378; glucose 90; bun 5; creat 0.38; k+2.7; na++ 138; ca 6.4; total bili 1.5; albumin 2.9; mag 2.2; tsh 2.260 10-23-17: wbc glucose 80; bun <5; creat <0.30; k+ 3.3; na++ 136; ca 5.8 10-26-17: glucose 85; bun <5; creat <0.30; k+3.3; na++ 138; ca 6.6 mag 2.2; phos 2.7; vit D 33.7 11-07-17: wbc 11.8; hgb 8.7; hct 26.8; mcv 84.5, plt 138; glucose 84; bun 12; creat <0.30; k+ 3.6; na++ 136; ca 6.8; albumin 2.0 11-14-17: wbc 11.0; hgb 9.1; hct 28.5; mcv 84.3; plt 376 glucose 104; bun 13; creat <0.30; k+ 4.0; na++ 138; ca 7.7; liver normal albumin 2.3; mag 2.0; phos 3.7     Review of Systems  Constitutional: Positive for malaise/fatigue.  Respiratory: Negative for cough and shortness of breath.   Cardiovascular: Negative for chest pain, palpitations and leg swelling.  Gastrointestinal: Negative for abdominal pain, constipation and heartburn.  Musculoskeletal: Negative for back pain, joint pain and myalgias.       Pain is being managed   Skin: Negative.   Neurological: Positive for weakness. Negative for dizziness.  Psychiatric/Behavioral: The patient is not nervous/anxious.    Physical Exam  Constitutional: He is oriented to person, place, and time. No distress.  Frail   Neck: No thyromegaly present.  Cardiovascular: Normal rate, regular rhythm and intact distal pulses.  Murmur heard. 1/6  Pulmonary/Chest: Effort normal and breath sounds normal. No respiratory distress.  Abdominal: Soft. Bowel sounds are normal. He exhibits no distension. There is no tenderness.  Has J tube present no signs of infection present   Genitourinary:  Genitourinary Comments: Right nephrostomy stent present   Musculoskeletal: Normal range of motion.  He exhibits no edema.  Has lumbar corset in place   Neurological:  He is alert and oriented to person, place, and time.  Skin: Skin is warm and dry. He is not diaphoretic.  Right chest portacath   Psychiatric: He has a normal mood and affect.   ASSESSMENT/ PLAN:   TODAY:  1. 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 Was diagnosed in July 2018. He has had a chemo of reduced [name] called mini-CHOP (october 2018) he did receive intrathecal methotrexate. The goal is to increase the dose back to normal levels. His weight is 140 pounds is taking marinol 5  Mg prior to lunch. For his pain management is taking oxycontin 10 mg twice daily and oxycodone 10 mg every 6 hours as needed will monitor   2. GERD: stable will continue protonix 40 mg daily   3. Electrolyte imbalance: including k+; mag; ca++; phosphate:  stable will continue phos nak 280-160-250 four times daily mag ox 400 mg twice daily and calcium carbonate 1200 mg three times daily is taking Vit D 50,000 units weekly vit d level 33.7   4.  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  5. Closed compression fracture of L3 lumbar vertebra: without change will continue therapy as directed has oxycodone 10 mg every 6 hours as needed is taking oxycontin 10 mg twice daily   6. Obstructing right ureteral stone: stable is status post right percutaneous nephrostomy exchange; will monitor   7. Chemo induced nausea and vomiting: without change will continue compazine 10 mg every 6 hours as needed and zofran 8 mg every 8 hours as needed  Is taking reglan 10 mg three times daily before meals.   8. Adrenal insuffiencey: stable will continue prednisone 20 mg daily is on bractrim 3 days weekly long term.   9. Acute pulmonary embolism: stable will continue eliquis 10 mg twice daily through 11-22-17 then 5 mg twice daily will monitor  10.  Protein calorie malnutrition: has required placement of J tube for his nutritional support will monitor  Will check cbc; cmp ;mag phos   Will setup a palliative care consult.    MD is aware of resident's narcotic use and is in agreement with current plan of care. We will attempt to wean resident as apropriate     Ok Edwards NP [facility]  Contact [phone] Monday through Friday 8am- 5pm  After hours call [phone]

## 2017-11-19 ENCOUNTER — Non-Acute Institutional Stay (SKILLED_NURSING_FACILITY): Payer: Medicaid Other | Admitting: Internal Medicine

## 2017-11-19 ENCOUNTER — Encounter: Payer: Self-pay | Admitting: Internal Medicine

## 2017-11-19 DIAGNOSIS — C8338 Diffuse large B-cell lymphoma, lymph nodes of multiple sites: Secondary | ICD-10-CM | POA: Diagnosis not present

## 2017-11-19 DIAGNOSIS — Z934 Other artificial openings of gastrointestinal tract status: Secondary | ICD-10-CM

## 2017-11-19 DIAGNOSIS — E43 Unspecified severe protein-calorie malnutrition: Secondary | ICD-10-CM

## 2017-11-19 DIAGNOSIS — C7951 Secondary malignant neoplasm of bone: Secondary | ICD-10-CM

## 2017-11-19 DIAGNOSIS — N139 Obstructive and reflux uropathy, unspecified: Secondary | ICD-10-CM

## 2017-11-19 DIAGNOSIS — J690 Pneumonitis due to inhalation of food and vomit: Secondary | ICD-10-CM | POA: Diagnosis not present

## 2017-11-19 DIAGNOSIS — I2699 Other pulmonary embolism without acute cor pulmonale: Secondary | ICD-10-CM

## 2017-11-19 DIAGNOSIS — F4323 Adjustment disorder with mixed anxiety and depressed mood: Secondary | ICD-10-CM | POA: Diagnosis not present

## 2017-11-19 DIAGNOSIS — L8995 Pressure ulcer of unspecified site, unstageable: Secondary | ICD-10-CM | POA: Diagnosis not present

## 2017-11-19 NOTE — Progress Notes (Deleted)
Provider:  DR Cory Ibarra Location:  [place] Room Number: 161 A Place of Service:  SNF (31)  PCP: Cory Jun, [name] Patient Care Team: Cory Jun, [name] as PCP - General [facility])  Extended Emergency Contact Information Primary Emergency Contact: Cory Ibarra Address: [address] [place], [place] [address] [name] of [place] Phone: [phone] Relation: Brother Secondary Emergency Contact: Cory Ibarra, Cory Ibarra [address] [name] of [place] Phone: [phone] Work Phone: [phone] Mobile Phone: [phone] Relation: Brother  Code Status: Full Code Goals of Care: Advanced Directive information Advanced Directives 11/19/2017  Does Patient Have a Medical Advance Directive? Yes  Type of Advance Directive [place]  Does patient want to make changes to medical advance directive? No - Patient declined  Copy of [place] in Chart? Yes  Would patient like information on creating a medical advance directive? No - Patient declined  Pre-existing out of facility DNR order (yellow form or pink MOST form) -      Chief Complaint  Patient presents with  . Readmit To SNF    Readmission    HPI: Patient is a 56 y.o. male seen today for admission to  Past Medical History:  Diagnosis Date  . Cancer ([place])   . Tendonitis    Past Surgical History:  Procedure Laterality Date  . CYSTOSCOPY W/ URETERAL STENT PLACEMENT Right 08/21/2017   Procedure: CYSTOSCOPY WITH RETROGRADE PYELOGRAM, RIGHT;  Surgeon: Alexis Frock, MD;  Location: [facility];  Service: Urology;  Laterality: Right;  . HERNIA REPAIR  1988   Dr. Vida Rigger  . IR FLUORO GUIDE PORT INSERTION RIGHT  07/26/2017  . IR GASTR TUBE CONVERT GASTR-JEJ PER W/FL MOD SED  11/09/2017  . IR GASTROSTOMY TUBE MOD SED  11/02/2017  . IR NEPHROSTOMY EXCHANGE RIGHT  10/04/2017  . IR NEPHROSTOMY PLACEMENT RIGHT   08/21/2017  . IR US GUIDE VASC ACCESS RIGHT  07/26/2017    reports that  has never smoked. he has never used smokeless tobacco. He reports that he drinks alcohol. He reports that he does not use drugs. Social History   Socioeconomic History  . Marital status: Single    Spouse name: Not on file  . Number of children: 0  . Years of education: Not on file  . Highest education level: Not on file  Social Needs  . Financial resource strain: Not on file  . Food insecurity - worry: Not on file  . Food insecurity - inability: Not on file  . Transportation needs - medical: Not on file  . Transportation needs - non-medical: Not on file  Occupational History  . Not on file  Tobacco Use  . Smoking status: Never Smoker  . Smokeless tobacco: Never Used  Substance and Sexual Activity  . Alcohol use: Yes    Comment: occasionally/socially  . Drug use: No  . Sexual activity: No  Other Topics Concern  . 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 Never used smokeless tobacco.   No e-cigarettes   Occasional alcohol consumption socially.   Single, no children.    Functional Status Survey:    Family History  Problem Relation Age of Onset  . Cancer Father     Health Maintenance  Topic Date Due  . INFLUENZA  VACCINE  12/16/2017 (Originally 07/04/2017)  . COLONOSCOPY  09/04/2018 (Originally 03/18/2009)  . TETANUS/TDAP  09/04/2018 (Originally 03/18/1978)  . Hepatitis C Screening  Completed  . HIV Screening  Completed    Allergies  Allergen Reactions  . Bee Venom Swelling    Outpatient Encounter Medications as of 11/19/2017  Medication Sig  . Amino Acids-Protein Hydrolys (FEEDING SUPPLEMENT, PRO-STAT SUGAR FREE 64,) LIQD Place 30 mLs into feeding tube daily.  Marland Kitchen apixaban (ELIQUIS) 5 MG TABS tablet Take 2 tabs po BID x7 days, then take 1 tab po BID  . [START ON 11/22/2017] apixaban (ELIQUIS) 5 MG TABS tablet Take 5 mg by  mouth 2 (two) times daily. Starting 11/22/17  . calcium elemental as carbonate (BARIATRIC TUMS ULTRA) 400 MG chewable tablet Chew 3 tablets (1,200 mg total) 3 (three) times daily by mouth.  . chlorhexidine (PERIDEX) 0.12 % solution 15 mLs by Mouth Rinse route 2 (two) times daily.  . Cholecalciferol 50000 units capsule Take 50,000 Units by mouth daily. Every Monday  . collagenase (SANTYL) ointment Apply topically daily. Applied to wounds in his sacral area and also thoracic spine once a day.  Marland Kitchen doxycycline (VIBRA-TABS) 100 MG tablet Take 1 tablet (100 mg total) by mouth 2 (two) times daily for 6 days.  Marland Kitchen dronabinol (MARINOL) 5 MG capsule Take 1 capsule (5 mg total) daily before lunch by mouth.  . Lidocaine-Prilocaine, Bulk, 2.5-2.5 % CREA Apply to affected areas topically one time daily  . magnesium oxide (MAG-OX) 400 (241.3 Mg) MG tablet Take 1 tablet (400 mg total) by mouth 2 (two) times daily.  . metoCLOPramide (REGLAN) 10 MG tablet Take 1 tablet (10 mg total) by mouth 3 (three) times daily before meals.  . metoprolol tartrate (LOPRESSOR) 25 MG tablet Take 0.5 tablets (12.5 mg total) by mouth 2 (two) times daily.  . Multiple Vitamin (MULTIVITAMIN) LIQD Take 15 mLs by mouth daily.  Marland Kitchen OLANZapine zydis (ZYPREXA) 5 MG disintegrating tablet Take 0.5 tablets (2.5 mg total) by mouth at bedtime.  Marland Kitchen oxyCODONE (OXYCONTIN) 10 mg 12 hr tablet Take 1 tablet (10 mg total) by mouth every 12 (twelve) hours.  Marland Kitchen oxyCODONE 10 MG TABS Take 1 tablet (10 mg total) by mouth every 6 (six) hours as needed (for pain).  . pantoprazole (PROTONIX) 40 MG tablet Take 40 mg by mouth daily with lunch.   . polyethylene glycol (MIRALAX / GLYCOLAX) packet Take 17 g daily as needed by mouth for moderate constipation.   . potassium & sodium phosphates (PHOS-NAK) 280-160-250 MG PACK Take 1 packet by mouth 4 (four) times daily -  before meals and at bedtime.  . predniSONE (DELTASONE) 10 MG tablet Take 2 tablets (20 mg total) daily with  breakfast by mouth.  . prochlorperazine (COMPAZINE) 10 MG tablet Take 10 mg every 6 (six) hours as needed by mouth for nausea or vomiting.  . senna-docusate (SENOKOT-S) 8.6-50 MG tablet Take 1 tablet by mouth daily as needed for mild constipation.  . sulfamethoxazole-trimethoprim (BACTRIM,SEPTRA) 400-80 MG tablet Take 2 tablets by mouth 3 (three) times a week.  . Vitamin D, Ergocalciferol, (DRISDOL) 50000 units CAPS capsule Take 1 capsule (50,000 Units total) by mouth every Monday.   Facility-Administered Encounter Medications as of 11/19/2017  Medication  . sodium chloride flush (NS) 0.9 % injection 10 mL    Review of Systems  Vitals:   11/19/17 1055  BP: 115/65  Pulse: 85  Resp: 18  Temp: 98.3 F (36.8 C)  SpO2: 96%  Weight:  140 lb 1.6 oz (63.5 kg)  Height: 6\' 2"  (1.88 m)   Body mass index is 17.99 kg/m. Physical Exam  Labs reviewed: Basic Metabolic Panel: Recent Labs    11/12/17 0335 11/13/17 0418 11/14/17 0332  NA 137 139 134*  K 3.6 3.9 4.0  CL 103 104 100*  CO2 29 29 28   GLUCOSE 99 113* 104*  BUN 8 12 13   CREATININE <0.30* <0.30* <0.30*  CALCIUM 7.7* 8.1* 7.7*  MG 2.1 2.1 2.0  PHOS 2.9 3.7 3.7   Liver Function Tests: Recent Labs    11/11/17 0442 11/12/17 0335 11/13/17 0418 11/14/17 0332  AST 22  --  16 21  ALT 23  --  20 21  ALKPHOS 102  --  93 96  BILITOT 0.6  --  0.6 0.5  PROT 4.7*  --  4.9* 4.8*  ALBUMIN 2.2* 2.3* 2.3* 2.3*   No results for input(s): LIPASE, AMYLASE in the last 8760 hours. No results for input(s): AMMONIA in the last 8760 hours. CBC: Recent Labs    10/25/17 0427  11/02/17 0358  11/10/17 0335  11/12/17 0335 11/13/17 0418 11/14/17 0332  WBC 4.4   < > 5.1   < > 4.3   < > 7.1 5.8 11.0*  NEUTROABS 3.3  --  4.0  --  3.5  --   --   --   --   HGB 9.9*   < > 9.6*   < > 8.5*   < > 9.0* 8.9* 9.1*  HCT 31.6*   < > 30.1*   < > 26.6*   < > 28.4* 28.0* 28.5*  MCV 83.8   < > 84.8   < > 84.7   < > 84.3 84.3 84.3  PLT 197   < > 257    < > 150   < > 249 297 376   < > = values in this interval not displayed.   Cardiac Enzymes: Recent Labs    08/21/17 1316 08/21/17 2249 08/22/17 0332  CKTOTAL 65  --   --   TROPONINI  --  0.98* 0.88*   BNP: Invalid input(s): POCBNP No results found for: HGBA1C Lab Results  Component Value Date   TSH 2.260 10/16/2017   No results found for: VITAMINB12 No results found for: FOLATE No results found for: IRON, TIBC, FERRITIN  Imaging and Procedures obtained prior to SNF admission: US Renal  Result Date: 10/16/2017 CLINICAL DATA:  Blood draining from the nephrostomy tube EXAM: RENAL / URINARY TRACT ULTRASOUND COMPLETE COMPARISON:  Fluoro spot radiographs of October 04, 2017 revealing a right nephrostomy and contrast in the right ureter. FINDINGS: Right Kidney: Length: 10.4 cm. The nephrostomy tube's pigtail can be seen within the mid renal collecting system. There is no hydronephrosis. No perinephric fluid collection is observed. The cortical echotexture is remains lower than that of the adjacent liver. Left Kidney: Length: 11.5 cm. Echogenicity within normal limits. No mass or hydronephrosis visualized. Bladder: The urinary bladder is decompressed by a Foley catheter. IMPRESSION: No etiology for the bloody nephrostomy tube drainage is observed. The proximal pigtail of the nephrostomy tube appears to lie in the mid renal collecting system. No hydronephrosis on the right or left. Electronically Signed   By: David  Martinique M.D.   On: 10/16/2017 13:54    Assessment/Plan    Family/ staff Communication:   Labs/tests ordered:    Cordella Register. [facility] [place], [place] [id number] [phone] Cell (Monday-Friday 8 AM - 5 PM) [phone] After 5 PM and follow prompts

## 2017-11-19 NOTE — Progress Notes (Signed)
Patient ID: Cory Ibarra, male   DOB: 12-20-1960, 56 y.o.   MRN: [id number]   Provider:  DR Arletha Grippe Location:  [place] Room Number: [place] of Service:  SNF (31)  PCP: [name], [name] Patient Care Team: [name], [name] as PCP - General [facility] Medicine)  Extended Emergency Contact Information Primary Emergency Contact: Jalloh,Michael Address: [address] [place], [place] [address] [name] of [place] Phone: [phone] Relation: Brother Secondary Emergency Contact: Constance Haw, [place] [address] [name] of [place] Phone: [phone] Work Phone: [phone] Mobile Phone: [phone] Relation: Brother  Code Status: Full Code Goals of Care: Advanced Directive information Advanced Directives 11/19/2017  Does Patient Have a Medical Advance Directive? Yes  Type of Advance Directive [place]  Does patient want to make changes to medical advance directive? No - Patient declined  Copy of [place] in Chart? Yes  Would patient like information on creating a medical advance directive? No - Patient declined  Pre-existing out of facility DNR order (yellow form or pink MOST form) -      Chief Complaint  Patient presents with  . Readmit To SNF    Readmission    HPI: Patient is a 56 y.o. male seen today for readmission into SNF following hospital stay for severe back pain, weight loss, electrolyte disturbance, gross hematuria, severe protein calorie malnutrition, aspiration pneumonia, J tube, ureteral stone s/p right nephrostomy, decubitus ulcers, sinus tachy, B cell lymphoma with lytic bone lesions, PE. He presented to the ED from oncology with lyte derangement. Lytes repleted. He was tx for aspiration pneumoniae with IV unasyn. He was started on doxy for decub ulcers. G tube -->J tube by IR on 11/09/17. LE doppler US neg for DVT. MRI spine -->multiple  lytic lesions; compression fx @ L3. Neurosx said he was not a candidate for repair. He presents to SNF for short term rehab.  Today he reports pain controlled (4/10 on scale). He takes oxycodone 10 mg prn every 6 hours and ATC oxycontin 10 mg twice daily. He is not due to resume chemotx until next month. He tolerates TF every PM. He will complete abx Doxy tomorrow. He is on [place] O2. Labs (cbc, cmp and phos) pending.  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 He was diagnosed in July 2018. Chemotx  [name] reduced to mini-CHOP in Ooctober 2018. He has received intrathecal methotrexate. Per oncology, goal is to increase the dose back to normal levels. He has a poor appetite and is taking marinol 5  mg prior to lunch. Pain controlled on oxycontin 10 mg twice daily and oxycodone 10 mg every 6 hours prn. He gets TF at night and also nutritional supplements per facility protocol.   GERD - stable on protonix 40 mg daily   Electrolyte imbalance - currently stable on phos nak 280-160-250 four times daily; mag ox 400 mg twice daily; calcium carbonate 1200 mg three times daily; Vit D 50,000 units weekly. last Vit D 25OH level 33.7.   Adjustment disorder with mixed anxiety and depressed mood - mood stable on  zyprexa 2.5 mg nightly; ativan 0.5 mg every 6 hours as needed for the next 14 days.  Obstructing right ureteral stone -stable s/p percutaneous nephrostomy exchange   Chemo induced nausea and  vomiting - unchanged. 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   Recent pulmonary embolism - stable. On eliquis 10 mg twice daily through 11-22-17 --> 5 mg twice daily. No bleeding noted  Protein calorie malnutrition - severe. Albumin 2.3. He gets TF via J tube qPM.  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 Followed by facilty wound care   Past Medical History:  Diagnosis Date  . Cancer ([place])   . Tendonitis    Past Surgical History:  Procedure Laterality Date  . CYSTOSCOPY W/ URETERAL STENT PLACEMENT Right 08/21/2017   Procedure: CYSTOSCOPY WITH RETROGRADE PYELOGRAM, RIGHT;  Surgeon: Alexis Frock, MD;  Location: [facility];  Service: Urology;  Laterality: Right;  . HERNIA REPAIR  1988   Dr. Vida Rigger  . IR FLUORO GUIDE PORT INSERTION RIGHT  07/26/2017  . IR GASTR TUBE CONVERT GASTR-JEJ PER W/FL MOD SED  11/09/2017  . IR GASTROSTOMY TUBE MOD SED  11/02/2017  . IR NEPHROSTOMY EXCHANGE RIGHT  10/04/2017  . IR NEPHROSTOMY PLACEMENT RIGHT  08/21/2017  . IR US GUIDE VASC ACCESS RIGHT  07/26/2017    reports that  has never smoked. he has never used smokeless tobacco. He reports that he drinks alcohol. He reports that he does not use drugs. Social History   Socioeconomic History  . Marital status: Single    Spouse name: Not on file  . Number of children: 0  . Years of education: Not on file  . Highest education level: Not on file  Social Needs  . Financial resource strain: Not on file  . Food insecurity - worry: Not on file  . Food insecurity - inability: Not on file  . Transportation needs - medical: Not on file  . Transportation needs - non-medical: Not on file  Occupational History  . Not on file  Tobacco Use  . Smoking status: Never Smoker  . Smokeless tobacco: Never Used  Substance and Sexual Activity  . Alcohol use: Yes    Comment: occasionally/socially  . Drug use: No  . Sexual activity: No  Other Topics Concern  . 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 Never used smokeless tobacco.   No e-cigarettes   Occasional alcohol consumption socially.   Single, no children.    Functional Status Survey:    Family History  Problem Relation Age of Onset  . Cancer Father     Health Maintenance    Topic Date Due  . INFLUENZA VACCINE  12/16/2017 (Originally 07/04/2017)  . COLONOSCOPY  09/04/2018 (Originally 03/18/2009)  . TETANUS/TDAP  09/04/2018 (Originally 03/18/1978)  . Hepatitis C Screening  Completed  . HIV Screening  Completed    Allergies  Allergen Reactions  . Bee Venom Swelling    Outpatient Encounter Medications as of 11/19/2017  Medication Sig  . Amino Acids-Protein Hydrolys (FEEDING SUPPLEMENT, PRO-STAT SUGAR FREE 64,) LIQD Place 30 mLs into feeding tube daily.  Marland Kitchen apixaban (ELIQUIS) 5 MG TABS tablet Take 2 tabs po BID x7 days, then take 1 tab po BID  . [START ON 11/22/2017] apixaban (ELIQUIS) 5 MG TABS tablet Take 5 mg by mouth 2 (two) times daily. Starting 11/22/17  . calcium elemental as carbonate (BARIATRIC TUMS ULTRA) 400 MG chewable  tablet Chew 3 tablets (1,200 mg total) 3 (three) times daily by mouth.  . chlorhexidine (PERIDEX) 0.12 % solution 15 mLs by Mouth Rinse route 2 (two) times daily.  . Cholecalciferol 50000 units capsule Take 50,000 Units by mouth daily. Every Monday  . collagenase (SANTYL) ointment Apply topically daily. Applied to wounds in his sacral area and also thoracic spine once a day.  Marland Kitchen doxycycline (VIBRA-TABS) 100 MG tablet Take 1 tablet (100 mg total) by mouth 2 (two) times daily for 6 days.  Marland Kitchen dronabinol (MARINOL) 5 MG capsule Take 1 capsule (5 mg total) daily before lunch by mouth.  . Lidocaine-Prilocaine, Bulk, 2.5-2.5 % CREA Apply to affected areas topically one time daily  . magnesium oxide (MAG-OX) 400 (241.3 Mg) MG tablet Take 1 tablet (400 mg total) by mouth 2 (two) times daily.  . metoCLOPramide (REGLAN) 10 MG tablet Take 1 tablet (10 mg total) by mouth 3 (three) times daily before meals.  . metoprolol tartrate (LOPRESSOR) 25 MG tablet Take 0.5 tablets (12.5 mg total) by mouth 2 (two) times daily.  . Multiple Vitamin (MULTIVITAMIN) LIQD Take 15 mLs by mouth daily.  Marland Kitchen OLANZapine zydis (ZYPREXA) 5 MG disintegrating tablet Take 0.5 tablets  (2.5 mg total) by mouth at bedtime.  Marland Kitchen oxyCODONE (OXYCONTIN) 10 mg 12 hr tablet Take 1 tablet (10 mg total) by mouth every 12 (twelve) hours.  Marland Kitchen oxyCODONE 10 MG TABS Take 1 tablet (10 mg total) by mouth every 6 (six) hours as needed (for pain).  . pantoprazole (PROTONIX) 40 MG tablet Take 40 mg by mouth daily with lunch.   . polyethylene glycol (MIRALAX / GLYCOLAX) packet Take 17 g daily as needed by mouth for moderate constipation.   . potassium & sodium phosphates (PHOS-NAK) 280-160-250 MG PACK Take 1 packet by mouth 4 (four) times daily -  before meals and at bedtime.  . predniSONE (DELTASONE) 10 MG tablet Take 2 tablets (20 mg total) daily with breakfast by mouth.  . prochlorperazine (COMPAZINE) 10 MG tablet Take 10 mg every 6 (six) hours as needed by mouth for nausea or vomiting.  . senna-docusate (SENOKOT-S) 8.6-50 MG tablet Take 1 tablet by mouth daily as needed for mild constipation.  . sulfamethoxazole-trimethoprim (BACTRIM,SEPTRA) 400-80 MG tablet Take 2 tablets by mouth 3 (three) times a week.  . Vitamin D, Ergocalciferol, (DRISDOL) 50000 units CAPS capsule Take 1 capsule (50,000 Units total) by mouth every Monday.   Facility-Administered Encounter Medications as of 11/19/2017  Medication  . sodium chloride flush (NS) 0.9 % injection 10 mL    Review of Systems  Constitutional: Positive for appetite change and fatigue.  Musculoskeletal: Positive for arthralgias and back pain.  Neurological: Positive for weakness.  All other systems reviewed and are negative.   Vitals:   11/19/17 1055  BP: 115/65  Pulse: 85  Resp: 18  Temp: 98.3 F (36.8 C)  SpO2: 96%  Weight: 140 lb 1.6 oz (63.5 kg)  Height: 6\' 2"  (1.88 m)   Body mass index is 17.99 kg/m. Physical Exam  Constitutional: He is oriented to person, place, and time. He appears well-developed.  Frail appearing in NAD, sitting up in bed. Looks pale.  HENT:  Mouth/Throat: Oropharynx is clear and moist.  MMM; no oral thrush   Eyes: Pupils are equal, round, and reactive to light. No scleral icterus.  Neck: Neck supple. Carotid bruit is not present. No thyromegaly present.  Cardiovascular: Normal rate, regular rhythm, normal heart sounds and intact distal pulses. Exam reveals  no gallop and no friction rub.  No murmur heard. No distal LE edema. No calf TTP  Pulmonary/Chest: Effort normal and breath sounds normal. He has no wheezes. He has no rales. He exhibits no tenderness.  Right ACW port-a-cath intact with no redness or d/c.  Abdominal: Soft. Normal appearance and bowel sounds are normal. He exhibits no distension, no abdominal bruit, no pulsatile midline mass and no mass. There is no hepatomegaly. There is no tenderness. There is no rigidity, no rebound and no guarding. No hernia.  J tube intact with no redness or d/c at insertion site, clamped  Lymphadenopathy:    He has no cervical adenopathy.  Neurological: He is alert and oriented to person, place, and time.  Skin: Skin is warm and dry. No rash noted.  unstageable decub ulcers on sacrum and thoracic area - followed by facility wound care  Psychiatric: He has a normal mood and affect. His behavior is normal. Judgment and thought content normal.    Labs reviewed: Basic Metabolic Panel: Recent Labs    11/12/17 0335 11/13/17 0418 11/14/17 0332  NA 137 139 134*  K 3.6 3.9 4.0  CL 103 104 100*  CO2 29 29 28   GLUCOSE 99 113* 104*  BUN 8 12 13   CREATININE <0.30* <0.30* <0.30*  CALCIUM 7.7* 8.1* 7.7*  MG 2.1 2.1 2.0  PHOS 2.9 3.7 3.7   Liver Function Tests: Recent Labs    11/11/17 0442 11/12/17 0335 11/13/17 0418 11/14/17 0332  AST 22  --  16 21  ALT 23  --  20 21  ALKPHOS 102  --  93 96  BILITOT 0.6  --  0.6 0.5  PROT 4.7*  --  4.9* 4.8*  ALBUMIN 2.2* 2.3* 2.3* 2.3*   No results for input(s): LIPASE, AMYLASE in the last 8760 hours. No results for input(s): AMMONIA in the last 8760 hours. CBC: Recent Labs    10/25/17 0427   11/02/17 0358  11/10/17 0335  11/12/17 0335 11/13/17 0418 11/14/17 0332  WBC 4.4   < > 5.1   < > 4.3   < > 7.1 5.8 11.0*  NEUTROABS 3.3  --  4.0  --  3.5  --   --   --   --   HGB 9.9*   < > 9.6*   < > 8.5*   < > 9.0* 8.9* 9.1*  HCT 31.6*   < > 30.1*   < > 26.6*   < > 28.4* 28.0* 28.5*  MCV 83.8   < > 84.8   < > 84.7   < > 84.3 84.3 84.3  PLT 197   < > 257   < > 150   < > 249 297 376   < > = values in this interval not displayed.   Cardiac Enzymes: Recent Labs    08/21/17 1316 08/21/17 2249 08/22/17 0332  CKTOTAL 65  --   --   TROPONINI  --  0.98* 0.88*   BNP: Invalid input(s): POCBNP No results found for: HGBA1C Lab Results  Component Value Date   TSH 2.260 10/16/2017   No results found for: VITAMINB12 No results found for: FOLATE No results found for: IRON, TIBC, FERRITIN  Imaging and Procedures obtained prior to SNF admission: US Renal  Result Date: 10/16/2017 CLINICAL DATA:  Blood draining from the nephrostomy tube EXAM: RENAL / URINARY TRACT ULTRASOUND COMPLETE COMPARISON:  Fluoro spot radiographs of October 04, 2017 revealing a right nephrostomy and contrast in the  right ureter. FINDINGS: Right Kidney: Length: 10.4 cm. The nephrostomy tube's pigtail can be seen within the mid renal collecting system. There is no hydronephrosis. No perinephric fluid collection is observed. The cortical echotexture is remains lower than that of the adjacent liver. Left Kidney: Length: 11.5 cm. Echogenicity within normal limits. No mass or hydronephrosis visualized. Bladder: The urinary bladder is decompressed by a Foley catheter. IMPRESSION: No etiology for the bloody nephrostomy tube drainage is observed. The proximal pigtail of the nephrostomy tube appears to lie in the mid renal collecting system. No hydronephrosis on the right or left. Electronically Signed   By: David  Martinique M.D.   On: 10/16/2017 13:54    Assessment/Plan   ICD-10-CM   1. Bony metastasis (HCC) C79.51   2. Diffuse  large B-cell lymphoma of lymph nodes of multiple regions (HCC) C83.38   3. Aspiration pneumonia, unspecified aspiration pneumonia type, unspecified laterality, unspecified part of lung ([place]) J69.0    improving  4. Other pulmonary embolism without acute cor pulmonale, unspecified chronicity (HCC) I26.99   5. Adjustment disorder with mixed anxiety and depressed mood F43.23   6. Obstructive uropathy N13.9    s/p right nephrostomy tube  7. 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 Severe protein-calorie malnutrition ([place]) E43   9. Pressure injury, unstageable, unspecified location [facility]) L89.95    sacral; thoracic area   Palliative care to follow. Prognosis is guarded  Finish abx for decub ulcers  Wound care as ordered  Nephrostomy care as ordered  F/u with specialists - including urology - as scheduled  Cont nutritional supplements per facility protocol  Cont TF as ordered  PT/OT/ST as tolerated  Cont [place] O2 as ordered  GOAL: short term rehab and d/c home when medically appropriate. Communicated with pt and nursing.  Will follow  Labs/tests ordered: cbc, cmp, phos pending    Benigno Check S. Perlie Gold  [facility] [address] [place], [place] [address] [phone] Cell (Monday-Friday 8 AM - 5 PM) [phone] After 5 PM and follow prompts

## 2017-11-29 ENCOUNTER — Other Ambulatory Visit: Payer: Self-pay

## 2017-11-29 MED ORDER — DRONABINOL 5 MG PO CAPS: 5.0000 mg | ORAL_CAPSULE | Freq: Every day | ORAL | 0 refills | Status: DC

## 2017-11-29 MED ORDER — OXYCODONE HCL 10 MG PO TABS: 10.0000 mg | ORAL_TABLET | Freq: Four times a day (QID) | ORAL | 0 refills | Status: DC | PRN

## 2017-11-29 NOTE — Telephone Encounter (Signed)
RX faxed to [facility] @ [phone], phone number [phone] 

## 2017-11-30 ENCOUNTER — Other Ambulatory Visit: Payer: Self-pay

## 2017-11-30 MED ORDER — OXYCODONE HCL ER 10 MG PO T12A: 10.0000 mg | EXTENDED_RELEASE_TABLET | Freq: Two times a day (BID) | ORAL | 0 refills | Status: DC

## 2017-11-30 NOTE — Telephone Encounter (Signed)
RX faxed to [facility] @ [phone], phone number [phone] 

## 2017-12-06 ENCOUNTER — Telehealth: Payer: Self-pay | Admitting: Family

## 2017-12-06 NOTE — Telephone Encounter (Signed)
Facility Nurse called to report patient was found sitting on the floor.Skin tear to left shoulder. Vital signs stable.Please follow up.

## 2017-12-07 ENCOUNTER — Non-Acute Institutional Stay (SKILLED_NURSING_FACILITY): Payer: Medicaid Other | Admitting: Adult Health

## 2017-12-07 ENCOUNTER — Encounter: Payer: Self-pay | Admitting: Adult Health

## 2017-12-07 DIAGNOSIS — C8338 Diffuse large B-cell lymphoma, lymph nodes of multiple sites: Secondary | ICD-10-CM | POA: Diagnosis not present

## 2017-12-07 DIAGNOSIS — M899 Disorder of bone, unspecified: Secondary | ICD-10-CM | POA: Diagnosis not present

## 2017-12-07 NOTE — Progress Notes (Signed)
Location:   [facility] Room Number: 131 A Place of Service:  SNF (31)   CODE STATUS: DNR  Allergies  Allergen Reactions  . Bee Venom Swelling    Chief Complaint  Patient presents with  . Acute Visit    Confusion / S/P fall    HPI:  He did have a fall yesterday without injury. He is more confused; staff is concerned that he may have an uti. There are other signs of infection present; there are no report of fevers; no indications of dysuria; frequency present. No nausea no vomiting. He tells me that he had a nightmare last night which caused hm to slide out of bed. He is presently denying any back or joint pain.    Past Medical History:  Diagnosis Date  . Cancer ([place])   . Tendonitis     Past Surgical History:  Procedure Laterality Date  . CYSTOSCOPY W/ URETERAL STENT PLACEMENT Right 08/21/2017   Procedure: CYSTOSCOPY WITH RETROGRADE PYELOGRAM, RIGHT;  Surgeon: Alexis Frock, MD;  Location: [facility];  Service: Urology;  Laterality: Right;  . HERNIA REPAIR  1988   Dr. Vida Rigger  . IR FLUORO GUIDE PORT INSERTION RIGHT  07/26/2017  . IR GASTR TUBE CONVERT GASTR-JEJ PER W/FL MOD SED  11/09/2017  . IR GASTROSTOMY TUBE MOD SED  11/02/2017  . IR NEPHROSTOMY EXCHANGE RIGHT  10/04/2017  . IR NEPHROSTOMY PLACEMENT RIGHT  08/21/2017  . IR US GUIDE VASC ACCESS RIGHT  07/26/2017    Social History   Socioeconomic History  . Marital status: Single    Spouse name: Not on file  . Number of children: 0  . Years of education: Not on file  . Highest education level: Not on file  Social Needs  . Financial resource strain: Not on file  . Food insecurity - worry: Not on file  . Food insecurity - inability: Not on file  . Transportation needs - medical: Not on file  . Transportation needs - non-medical: Not on file  Occupational History  . Not on file  Tobacco Use  . Smoking status: Never Smoker  . Smokeless tobacco: Never Used  Substance and Sexual Activity  . Alcohol use:  Yes    Comment: occasionally/socially  . Drug use: No  . Sexual activity: No  Other Topics Concern  . 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 Never used smokeless tobacco.   No e-cigarettes   Occasional alcohol consumption socially.   Single, no children.   Family History  Problem Relation Age of Onset  . Cancer Father       VITAL SIGNS BP 100/66   Pulse 68   Temp 98.7 F (37.1 C)   Resp 19   Ht 6' 2"  (1.88 m)   Wt 120 lb (54.4 kg)   SpO2 94%   BMI 15.41 kg/m   Outpatient Encounter Medications as of 12/07/2017  Medication Sig  . apixaban (ELIQUIS) 5 MG TABS tablet Take 5 mg by mouth 2 (two) times daily. Starting 11/22/17  . calcium elemental as carbonate (BARIATRIC TUMS ULTRA) 400 MG chewable tablet Chew 3 tablets (1,200 mg total) 3 (three) times daily by mouth.  . chlorhexidine (PERIDEX) 0.12 % solution 15 mLs by Mouth Rinse route 2 (two) times daily.  . Cholecalciferol 50000 units capsule Take 50,000 Units by mouth daily. Every Monday  . collagenase (SANTYL) ointment Apply topically daily. Applied to wounds  in his sacral area and also thoracic spine once a day.  . dronabinol (MARINOL) 5 MG capsule Take 1 capsule (5 mg total) by mouth daily before lunch.  . Lidocaine-Prilocaine, Bulk, 2.5-2.5 % CREA Apply to affected areas topically one time daily  . magnesium oxide (MAG-OX) 400 (241.3 Mg) MG tablet Take 1 tablet (400 mg total) by mouth 2 (two) times daily.  . metoCLOPramide (REGLAN) 10 MG tablet Take 1 tablet (10 mg total) by mouth 3 (three) times daily before meals.  . metoprolol tartrate (LOPRESSOR) 25 MG tablet Take 0.5 tablets (12.5 mg total) by mouth 2 (two) times daily.  . Multiple Vitamin (MULTIVITAMIN) LIQD Take 15 mLs by mouth daily.  Marland Kitchen OLANZapine zydis (ZYPREXA) 5 MG disintegrating tablet Take 0.5 tablets (2.5 mg total) by mouth at bedtime.  Marland Kitchen oxyCODONE (OXYCONTIN) 10 mg 12 hr tablet Take 1  tablet (10 mg total) by mouth every 12 (twelve) hours.  . Oxycodone HCl 10 MG TABS Take 1 tablet (10 mg total) by mouth every 6 (six) hours as needed (for pain).  . pantoprazole (PROTONIX) 40 MG tablet Take 40 mg by mouth daily with lunch.   . polyethylene glycol (MIRALAX / GLYCOLAX) packet Take 17 g daily as needed by mouth for moderate constipation.   . potassium & sodium phosphates (PHOS-NAK) 280-160-250 MG PACK Take 1 packet by mouth 4 (four) times daily -  before meals and at bedtime.  . predniSONE (DELTASONE) 10 MG tablet Take 2 tablets (20 mg total) daily with breakfast by mouth.  . prochlorperazine (COMPAZINE) 10 MG tablet Take 10 mg every 6 (six) hours as needed by mouth for nausea or vomiting.  . senna-docusate (SENOKOT-S) 8.6-50 MG tablet Take 1 tablet by mouth daily as needed for mild constipation.  . sulfamethoxazole-trimethoprim (BACTRIM,SEPTRA) 400-80 MG tablet Take 2 tablets by mouth 3 (three) times a week.  . Amino Acids-Protein Hydrolys (FEEDING SUPPLEMENT, PRO-STAT SUGAR FREE 64,) LIQD Place 30 mLs into feeding tube daily.  Marland Kitchen apixaban (ELIQUIS) 5 MG TABS tablet Take 2 tabs po BID x7 days, then take 1 tab po BID  . [DISCONTINUED] Vitamin D, Ergocalciferol, (DRISDOL) 50000 units CAPS capsule Take 1 capsule (50,000 Units total) by mouth every Monday. (Patient not taking: Reported on 12/07/2017)   Facility-Administered Encounter Medications as of 12/07/2017  Medication  . sodium chloride flush (NS) 0.9 % injection 10 mL     SIGNIFICANT DIAGNOSTIC EXAMS  PREVIOUS:   08-21-17:chest x-ray: No active cardiopulmonary disease.  08-21-17: lumbar spine complete x-ray: There is again noted fracture L3 vertebral body. Tumor extends into and involves the left L3 pedicle. This finding was present on prior lumbar MR. No new bone lesions are evident. No new fracture. No spondylolisthesis. There is facet osteoarthritic change at L4-5 L5-S1 bilaterally. There is also loss of the pedicle on the  right at T12, indicative of bony metastatic disease in this area.  08-21-17: thoracic spine x-ray: No fracture or spondylolisthesis. There are multiple metastatic foci Seen in the thoracic spine performed three months prior. By radiography, there is loss of the pedicle of T12 on the right and subtle loss of the lateral aspect of the pedicle of T8 on the left. 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 No other neoplastic foci identified by radiography the bony structures. Note that there are multiple foci of tumor and thoracic vertebral bodies seen on recent MR not seen by radiography currently.   08-22-17: placement of right nephrostomy placement  10-03-17:  chest x-ray: No active cardiopulmonary disease.   10-03-17: ct angio of chest:  1. Acute pulmonary embolism within the LEFT lower lobe and lingula. These small emboli are nonocclusive. Overall clot burden is minimal. 2. No evidence of RIGHT ventricular strain. 3. Patchy airspace disease in the LEFT lower lobe. Favor pulmonary infection such as pneumonia or aspiration rather than pulmonary infarction. 4. Frothy material within the LEFT lower lobe bronchus could represent aspirate from tracheal aspiration.  10-04-17 right nephrostomy stent replacement   10-16-17: renal ultrasound:  No etiology for the bloody nephrostomy tube drainage is observed. The proximal pigtail of the nephrostomy tube appears to lie in the mid renal collecting system. No hydronephrosis on the right or left.  10-19-17: ct of neck:  1. No prior head or neck CT. 2. 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 Associated pathologic fractures of the right C1 lateral mass and the C4 vertebral body. 3. No lymphadenopathy in the neck. 4. There is a 2 cm circumscribed cyst in the posterior right hypopharynx contiguous with the right AE fold which appears to be new since the CTA chest 10/03/2017. Burtis Junes this is a  benign retention cyst. 5. Right mastoid effusion, likely reactive to #2.  10-19-17: ct of chest abdomen and pelvis:  1. Overall improved CT appearance of the patient's lymphoma most significantly involving the osseous structures. No new or progressive bony findings. 2. Significant improvement of the retroperitoneal lymph nodes. No new or progressive findings in the abdomen or pelvis. 3. Right-sided externalized nephrostomy tube with obstructing upper ureteral calculi and associated mild to moderate hydroureteronephrosis. 4. Stable lung findings.  10-19-17: NM bone scan whole body: Multiple bony metastases as described. Focal increased activity overlying the right maxilla may represent dental disease or possibly metastasis.  10-27-17: MRI cervical; thoracic; lumbar spine:  1. Multiple vertebral body, posterior element, rib, sacrum, and left scapula enhancing metastasis. 2. No epidural, leptomeningeal, or cord metastasis. 3. Pathologic mild C4, mild T10, mild T12, and moderate L3 compression deformities. 4. Mild bony retropulsion of L3 vertebral body eccentric to left with mild canal and severe left L3-4 foraminal stenosis. 5. Cystic lesion in right posterior hypopharynx measuring up to 22 mm, direct visualization recommended.  NO NEW EXAMS     LABS REVIEWED: PREVIOUS:   08-21-17: wbc 0.1; hgb 9.8; hct 27.6; mcv 71.7; plt 76; glucose 144 bun 12; creat 0.63; k+ 3.5; na++ 135; ca 6.9; liver normal albumin 3.1; phos 1.9; BNP 607.1; uric acid 3.8 blood culture: enterobacteriaceae; e-coli 08-24-17: wbc 5.3; hgb 8.7; hct 249; mcv 73.7; plt 57; glucose 93; bun 11; creat 0.38; k+ 2.7; na++ 139; ca 6.3; albumin 2.3 08-25-17: blood culture: no growth  08-27-17: wbc 13.4; hgb 8.7; hct 25.2; mcv 73.5; plt 63; glucose 92; bun 7; creat 0.31; k+ 3.0; na++ 140; ca 6.3; alt 73; albumin 2.3  08-30-17: wbc 6.2; hgb 9.0; hct 26.5; mcv 74.9; plt 120; glucose 109; bun <5' creat <0.30; k+ 3.4; na++ 135; ca 6.2; liver  normal albumin 2.3; mag 1.6; phos 1.7 09-03-17: wbc 6.3; hgb 10.0; hct 29.5; mcv 74.1; plt 403; glucose 103; bun 8; creat 0.38; k+ 3.1; na++ 136; mag 1.7; phos 1.9  09-17-17: wbc 10.4; hgb 11.6; hct 35.4; mcv 75.0; plt 453; glucose 108; bun 7.3; creat 0.5; k+ 3.5; na++ 135; ca 6.4; total bili 1.26; albumin 2.4; uric acid 6.8; mag 1.6; phos 1.9 09-21-17: wbc 30.8; hgb 8.4; hct 26.0; mcv 78.3; plt  252; uric acid 1.9; mag 1.7; phos 1.6 09-24-17: wbc 13.7; hgb 8.0; hct 26.1; mcv 78.4; plt 145; glucose 87; bun <5; creat 0.30; k+ 4.3; na+= 137; ca 6.5; ast 103; alt 76; alk phos 195; albumin 2.2; uric acid 1.6; mag 1.7; phos 2.3; vit D 8.3 10-03-17: wbc 3.4; hgb 9.2; hct 29.9; mcv 80.8; plt 415; glucose 97; bun 10; creat 0.45; k+ 3.0; na++ 139; ca 6.9; indirect bili 1.3; alk phos 158 albumin 2.8; mag 2.0; phos 1.6; blood culture: no growth; urine culture: 50,000 e-coli 10-05-17: wbc 2.9; hgb 7.8; hct 24.6; mcv 80.7 ;plt 329; glucose 92; bun 9; creat 0.36; k+ 3.0; na++ 140; ca 6.4; mag 1.7; phos 1.8; ionized calcium 3.5 10-08-17: wbc 3.2; hgb 9.0; hct 28.6; mcv 81.7; plt 318; glucose 99; bun <5; creat 0.31; k+ 3.5; na++ 140; ca 6.9; liver normal albumin 2.3; mag 1.6; phos 2.4  10-16-17: wbc 9.6; hgb 11.2; hct 35.3; mcv 81.1; plt 378; glucose 90; bun 5; creat 0.38; k+2.7; na++ 138; ca 6.4; total bili 1.5; albumin 2.9; mag 2.2; tsh 2.260 10-23-17: wbc glucose 80; bun <5; creat <0.30; k+ 3.3; na++ 136; ca 5.8 10-26-17: glucose 85; bun <5; creat <0.30; k+3.3; na++ 138; ca 6.6 mag 2.2; phos 2.7; vit D 33.7 11-07-17: wbc 11.8; hgb 8.7; hct 26.8; mcv 84.5, plt 138; glucose 84; bun 12; creat <0.30; k+ 3.6; na++ 136; ca 6.8; albumin 2.0 11-14-17: wbc 11.0; hgb 9.1; hct 28.5; mcv 84.3; plt 376 glucose 104; bun 13; creat <0.30; k+ 4.0; na++ 138; ca 7.7; liver normal albumin 2.3; mag 2.0; phos 3.7  NO NEW LABS     Review of Systems  Constitutional: Negative for malaise/fatigue.  Respiratory: Negative for cough and  shortness of breath.   Cardiovascular: Negative for chest pain and leg swelling.  Gastrointestinal: Negative for abdominal pain, constipation and heartburn.  Genitourinary: Negative for dysuria and urgency.  Musculoskeletal: Negative for back pain, joint pain and myalgias.  Skin: Negative.   Neurological: Negative for dizziness.  Psychiatric/Behavioral: The patient is not nervous/anxious.    Physical Exam  Constitutional: He is oriented to person, place, and time. No distress.  Frail   Neck: No thyromegaly present.  Cardiovascular: Normal rate, regular rhythm and intact distal pulses.  Murmur heard. 1/6  Pulmonary/Chest: Effort normal and breath sounds normal. No respiratory distress.  Abdominal: Soft. Bowel sounds are normal. He exhibits no distension. There is no tenderness.  J tube present   Musculoskeletal: He exhibits no edema.  Is able to move all extremities   Lymphadenopathy:    He has no cervical adenopathy.  Neurological: He is alert and oriented to person, place, and time.  Skin: Skin is warm and dry. He is not diaphoretic.  Right chest portacth   Psychiatric: He has a normal mood and affect.     ASSESSMENT/ PLAN:   TODAY:  1. 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 Was diagnosed in July 2018. He has had a chemo of reduced [name] called mini-CHOP (october 2018) he did receive intrathecal methotrexate. The goal is to increase the dose back to normal levels. His weight is 120 pounds is taking marinol 5  Mg prior to lunch. For his pain management is taking oxycontin 10 mg twice daily and oxycodone 10 mg every 6 hours as needed will monitor   Will check cbc; cmp; mag and phos levels to look for lab abnormalities This confusion may related to his  lymphoma. Will monitor vital signs twice daily for the next four days and will monitor   MD is aware of resident's narcotic use and is in agreement with current plan of care. We will  attempt to wean resident as apropriate    Ok Edwards NP [facility]  Contact [phone] Monday through Friday 8am- 5pm  After hours call [phone]

## 2017-12-08 ENCOUNTER — Encounter (HOSPITAL_COMMUNITY): Payer: Self-pay | Admitting: Emergency Medicine

## 2017-12-08 ENCOUNTER — Emergency Department (HOSPITAL_COMMUNITY)
Admission: EM | Admit: 2017-12-08 | Discharge: 2017-12-09 | Disposition: A | Discharge status: Home / Self Care | Payer: Medicaid Other | Attending: Emergency Medicine | Admitting: Emergency Medicine

## 2017-12-08 DIAGNOSIS — S39012A Strain of muscle, fascia and tendon of lower back, initial encounter: Secondary | ICD-10-CM | POA: Diagnosis not present

## 2017-12-08 DIAGNOSIS — W06XXXA Fall from bed, initial encounter: Secondary | ICD-10-CM | POA: Diagnosis not present

## 2017-12-08 DIAGNOSIS — Z936 Other artificial openings of urinary tract status: Secondary | ICD-10-CM | POA: Insufficient documentation

## 2017-12-08 DIAGNOSIS — Y939 Activity, unspecified: Secondary | ICD-10-CM | POA: Insufficient documentation

## 2017-12-08 DIAGNOSIS — Z7901 Long term (current) use of anticoagulants: Secondary | ICD-10-CM | POA: Insufficient documentation

## 2017-12-08 DIAGNOSIS — Z79899 Other long term (current) drug therapy: Secondary | ICD-10-CM | POA: Diagnosis not present

## 2017-12-08 DIAGNOSIS — R000 Tachycardia, unspecified: Secondary | ICD-10-CM | POA: Insufficient documentation

## 2017-12-08 DIAGNOSIS — C8330 Diffuse large B-cell lymphoma, unspecified site: Secondary | ICD-10-CM | POA: Diagnosis not present

## 2017-12-08 DIAGNOSIS — R410 Disorientation, unspecified: Secondary | ICD-10-CM | POA: Diagnosis not present

## 2017-12-08 DIAGNOSIS — Z931 Gastrostomy status: Secondary | ICD-10-CM | POA: Insufficient documentation

## 2017-12-08 DIAGNOSIS — Z91030 Bee allergy status: Secondary | ICD-10-CM | POA: Diagnosis not present

## 2017-12-08 DIAGNOSIS — Y92122 Bedroom in nursing home as the place of occurrence of the external cause: Secondary | ICD-10-CM | POA: Diagnosis not present

## 2017-12-08 DIAGNOSIS — Y999 Unspecified external cause status: Secondary | ICD-10-CM | POA: Insufficient documentation

## 2017-12-08 DIAGNOSIS — S3992XA Unspecified injury of lower back, initial encounter: Secondary | ICD-10-CM | POA: Diagnosis present

## 2017-12-08 DIAGNOSIS — W19XXXA Unspecified fall, initial encounter: Secondary | ICD-10-CM

## 2017-12-08 NOTE — ED Notes (Signed)
Bed: WA25 Expected date:  Expected time:  Means of arrival:  Comments: 

## 2017-12-08 NOTE — ED Triage Notes (Signed)
Patient here from [place] with complaints of unwitnessed fall today. Patient complains of lower back pain and bilateral leg pain. Hx of lymphoma.

## 2017-12-09 ENCOUNTER — Emergency Department (HOSPITAL_COMMUNITY): Payer: Medicaid Other

## 2017-12-09 LAB — CBC WITH DIFFERENTIAL/PLATELET
BASOS ABS: 0 10*3/uL (ref 0.0–0.1)
Basophils Relative: 0 %
EOS PCT: 0 %
Eosinophils Absolute: 0 10*3/uL (ref 0.0–0.7)
HCT: 32.2 % — ABNORMAL LOW (ref 39.0–52.0)
HEMOGLOBIN: 10.2 g/dL — AB (ref 13.0–17.0)
Lymphocytes Relative: 6 %
Lymphs Abs: 1.1 10*3/uL (ref 0.7–4.0)
MCH: 26 pg (ref 26.0–34.0)
MCHC: 31.7 g/dL (ref 30.0–36.0)
MCV: 81.9 fL (ref 78.0–100.0)
MONO ABS: 0.5 10*3/uL (ref 0.1–1.0)
Monocytes Relative: 3 %
NEUTROS PCT: 91 %
Neutro Abs: 16.1 10*3/uL — ABNORMAL HIGH (ref 1.7–7.7)
PLATELETS: 403 10*3/uL — AB (ref 150–400)
RBC: 3.93 MIL/uL — AB (ref 4.22–5.81)
RDW: 16 % — ABNORMAL HIGH (ref 11.5–15.5)
WBC: 17.7 10*3/uL — AB (ref 4.0–10.5)

## 2017-12-09 LAB — BASIC METABOLIC PANEL
ANION GAP: 11 (ref 5–15)
BUN: 16 mg/dL (ref 6–20)
CHLORIDE: 101 mmol/L (ref 101–111)
CO2: 24 mmol/L (ref 22–32)
Calcium: 8.6 mg/dL — ABNORMAL LOW (ref 8.9–10.3)
Creatinine, Ser: 0.36 mg/dL — ABNORMAL LOW (ref 0.61–1.24)
GFR calc non Af Amer: >60 mL/min (ref >60)
Glucose, Bld: 110 mg/dL — ABNORMAL HIGH (ref 65–99)
POTASSIUM: 4 mmol/L (ref 3.5–5.1)
Sodium: 136 mmol/L (ref 135–145)

## 2017-12-09 MED ORDER — SODIUM CHLORIDE 0.9 % IV BOLUS (SEPSIS)
1000.0000 mL | Freq: Once | INTRAVENOUS | Status: AC
Start: 2017-12-09 — End: 2017-12-09
  Administered 2017-12-09: 1000 mL via INTRAVENOUS

## 2017-12-09 MED ORDER — OXYCODONE-ACETAMINOPHEN 5-325 MG PO TABS
2.0000 | ORAL_TABLET | Freq: Once | ORAL | Status: AC
  Administered 2017-12-09: 2 via ORAL
  Filled 2017-12-09: qty 2

## 2017-12-09 NOTE — ED Provider Notes (Signed)
[facility] DEPT Provider Note   CSN: [id number] Arrival date & time: 12/08/17  2148     History   Chief Complaint Chief Complaint  Patient presents with  . Fall  . Back Pain  . Leg Pain    HPI Cory Ibarra is a 57 y.o. male.  The history is provided by the patient.  Fall  This is a new problem. Episode onset: Just prior to arrival. The problem occurs constantly. The problem has not changed since onset.Pertinent negatives include no chest pain and no abdominal pain. Associated symptoms comments: Back pain. Nothing aggravates the symptoms. Nothing relieves the symptoms.  Back Pain   Associated symptoms include leg pain. Pertinent negatives include no chest pain, no fever and no abdominal pain.  Leg Pain     Patient with multiple medical problems including lymphoma, nephrostomy tube, G-tube presents from nursing facility after fall Reports he was trying to get out of bed and he fell on his back, and has pain in his upper and lower back He is now feeling very anxious, reports he is due for some pain medicines He denies any anterior chest pain, denies any abdominal pain    Past Medical History:  Diagnosis Date  . Cancer ([place])   . Tendonitis     Patient Active Problem List   Diagnosis Date Noted  . Calculus of ureter   . Malnutrition ([place])   . Aspiration pneumonia due to regurgitated food ([place])   . Goals of care, counseling/discussion   . Sinus tachycardia 11/06/2017  . Lymphoma ([place]) 11/06/2017  . Lytic bone lesions on xray 11/06/2017  . Pressure injury of skin 10/04/2017  . Dehydration   . Hypoxia   . Pulmonary emboli ([place]) 10/03/2017  . Healthcare-associated pneumonia   . Obstructive uropathy   . Liver function test abnormality   . Renal calculus or stone 09/19/2017  . Leakage of nephrostomy catheter ([place]) 09/19/2017  . 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 Gastroesophageal reflux disease without esophagitis 09/19/2017  .  Hypokalemia 09/18/2017  . Hypomagnesemia 09/18/2017  . Electrolyte abnormality   . Hypovolemia 09/17/2017  . Chemotherapy-induced nausea 09/17/2017  . Constipation 09/17/2017  . 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 Hypocalcemia 09/17/2017  . Severe protein-calorie malnutrition ([place])   . Adjustment disorder with mixed anxiety and depressed mood   . Palliative care encounter   . Admission for antineoplastic chemotherapy   . Septic shock due to Escherichia coli ([place])   . Neutropenia ([place]) 08/21/2017  . Gross hematuria   . Lactic acidosis   . Neutropenic sepsis ([place])   . Hypophosphatemia 08/20/2017  . Diffuse large B cell lymphoma ([place]) 07/03/2017  . Bony metastasis ([place]) 06/29/2017  . Chest wall mass   . 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 Closed compression fracture of L3 lumbar vertebra ([place]) 06/28/2017  . Severe back pain 06/28/2017  . Weight loss, unintentional 06/28/2017    Past Surgical History:  Procedure Laterality Date  . CYSTOSCOPY W/ URETERAL STENT PLACEMENT Right 08/21/2017   Procedure: CYSTOSCOPY WITH RETROGRADE PYELOGRAM, RIGHT;  Surgeon: Alexis Frock, MD;  Location: [facility];  Service: Urology;  Laterality: Right;  . HERNIA REPAIR  1988   Dr. Vida Rigger  . IR FLUORO GUIDE PORT INSERTION RIGHT  07/26/2017  . IR GASTR TUBE CONVERT GASTR-JEJ PER W/FL MOD SED  11/09/2017  . IR GASTROSTOMY TUBE MOD SED  11/02/2017  . IR NEPHROSTOMY EXCHANGE RIGHT  10/04/2017  . IR NEPHROSTOMY PLACEMENT RIGHT  08/21/2017  . IR US GUIDE VASC ACCESS RIGHT  07/26/2017       Home Medications    Prior to Admission medications   Medication Sig Start Date End Date Taking? Authorizing Provider  Amino Acids-Protein Hydrolys (FEEDING SUPPLEMENT, PRO-STAT SUGAR FREE 64,) LIQD Place 30 mLs into feeding tube daily. 11/15/17   Barton Dubois, MD  apixaban (ELIQUIS) 5 MG TABS tablet Take 2 tabs po BID x7 days, then take 1 tab po BID 10/08/17   Patrecia Pour, MD  apixaban (ELIQUIS) 5  MG TABS tablet Take 5 mg by mouth 2 (two) times daily. Starting 11/22/17 11/22/17   [provider]  calcium elemental as carbonate (BARIATRIC TUMS ULTRA) 400 MG chewable tablet Chew 3 tablets (1,200 mg total) 3 (three) times daily by mouth. 10/08/17   Patrecia Pour, MD  chlorhexidine (PERIDEX) 0.12 % solution 15 mLs by Mouth Rinse route 2 (two) times daily. 09/03/17   Theodis Blaze, MD  Cholecalciferol 50000 units capsule Take 50,000 Units by mouth daily. Every Monday    [provider]  collagenase (SANTYL) ointment Apply topically daily. Applied to wounds in his sacral area and also thoracic spine once a day. 11/15/17   Barton Dubois, MD  dronabinol (MARINOL) 5 MG capsule Take 1 capsule (5 mg total) by mouth daily before lunch. 11/29/17   Gerlene Fee, NP  Lidocaine-Prilocaine, Bulk, 2.5-2.5 % CREA Apply to affected areas topically one time daily    [provider]  magnesium oxide (MAG-OX) 400 (241.3 Mg) MG tablet Take 1 tablet (400 mg total) by mouth 2 (two) times daily. 11/14/17 12/14/17  Barton Dubois, MD  metoCLOPramide (REGLAN) 10 MG tablet Take 1 tablet (10 mg total) by mouth 3 (three) times daily before meals. 11/14/17   Barton Dubois, MD  metoprolol tartrate (LOPRESSOR) 25 MG tablet Take 0.5 tablets (12.5 mg total) by mouth 2 (two) times daily. 11/14/17   Barton Dubois, MD  Multiple Vitamin (MULTIVITAMIN) LIQD Take 15 mLs by mouth daily. 11/15/17   Barton Dubois, MD  OLANZapine zydis (ZYPREXA) 5 MG disintegrating tablet Take 0.5 tablets (2.5 mg total) by mouth at bedtime. 09/03/17   Theodis Blaze, MD  oxyCODONE (OXYCONTIN) 10 mg 12 hr tablet Take 1 tablet (10 mg total) by mouth every 12 (twelve) hours. 11/30/17   Gerlene Fee, NP  Oxycodone HCl 10 MG TABS Take 1 tablet (10 mg total) by mouth every 6 (six) hours as needed (for pain). 11/29/17   Gerlene Fee, NP  pantoprazole (PROTONIX) 40 MG tablet Take 40 mg by mouth daily with lunch.     [provider]  polyethylene glycol (MIRALAX / GLYCOLAX) packet Take 17 g daily as needed by mouth for moderate constipation.     [provider]  potassium & sodium phosphates (PHOS-NAK) 280-160-250 MG PACK Take 1 packet by mouth 4 (four) times daily -  before meals and at bedtime. 11/14/17   Barton Dubois, MD  predniSONE (DELTASONE) 10 MG tablet Take 2 tablets (20 mg total) daily with breakfast by mouth. 10/08/17   Patrecia Pour, MD  prochlorperazine (COMPAZINE) 10 MG tablet Take 10 mg every 6 (six) hours as needed by mouth for nausea or vomiting.    [provider]  senna-docusate (SENOKOT-S) 8.6-50 MG tablet Take 1 tablet by mouth daily as needed for mild constipation.    [provider]  sulfamethoxazole-trimethoprim (BACTRIM,SEPTRA) 400-80 MG tablet Take 2 tablets by mouth 3 (three) times a  week. 11/16/17   Barton Dubois, MD    Family History Family History  Problem Relation Age of Onset  . Cancer Father     Social History Social History   Tobacco Use  . Smoking status: Never Smoker  . Smokeless tobacco: Never Used  Substance Use Topics  . Alcohol use: Yes    Comment: occasionally/socially  . Drug use: No     Allergies   Bee venom   Review of Systems Review of Systems  Constitutional: Negative for fever.  Cardiovascular: Negative for chest pain.  Gastrointestinal: Negative for abdominal pain.  Musculoskeletal: Positive for back pain.  All other systems reviewed and are negative.    Physical Exam Updated Vital Signs BP 115/70 (BP Location: Right Arm)   Pulse (!) 128 Comment: informed the nurse  Temp 98.2 F (36.8 C)   Resp (!) 22   SpO2 93%   Physical Exam CONSTITUTIONAL: Chronically ill-appearing, anxious HEAD: Normocephalic/atraumatic EYES: EOMI ENMT: Mucous membranes moist NECK: supple no meningeal signs SPINE/BACK: Diffuse spinal tenderness CV: S1/S2 noted, no murmurs/rubs/gallops noted, tachycardic LUNGS: Lungs are clear  to auscultation bilaterally, no apparent distress ABDOMEN: soft, nontender, G-tube in place Right flank - small suture in place but no tube noted NEURO: Pt is awake/alert/appropriate, moves all extremitiesx4.  EXTREMITIES: pulses normal/equal, full ROM, pelvis stable, all other extremities/joints palpated/ranged and nontender SKIN: warm, color normal, multiple bandages noted on back, sacral wound noted, does not appear infected PSYCH: Anxious ED Treatments / Results  Labs (all labs ordered are listed, but only abnormal results are displayed) Labs Reviewed  CBC WITH DIFFERENTIAL/PLATELET - Abnormal; Notable for the following components:      Result Value   WBC 17.7 (*)    RBC 3.93 (*)    Hemoglobin 10.2 (*)    HCT 32.2 (*)    RDW 16.0 (*)    Platelets 403 (*)    Neutro Abs 16.1 (*)    All other components within normal limits  BASIC METABOLIC PANEL - Abnormal; Notable for the following components:   Glucose, Bld 110 (*)    Creatinine, Ser 0.36 (*)    Calcium 8.6 (*)    All other components within normal limits    EKG  EKG Interpretation None       Radiology Dg Chest 2 View  Result Date: 12/09/2017 CLINICAL DATA:  Lower back pain and bilateral leg pain. EXAM: CHEST  2 VIEW COMPARISON:  11/06/2017 chest radiograph FINDINGS: Normal cardiac silhouette. Right port catheter tip projects over mid SVC and is stable. Few linear opacities compatible minor scarring or atelectasis. No consolidation. No pleural effusion or pneumothorax. No acute osseous abnormality is evident. IMPRESSION: No acute pulmonary process identified. Electronically Signed   By: Kristine Garbe M.D.   On: 12/09/2017 00:57   Dg Lumbar Spine Complete  Result Date: 12/09/2017 CLINICAL DATA:  Unwitnessed fall today. Lumbosacral back pain. Bilateral leg pain. EXAM: LUMBAR SPINE - COMPLETE 4+ VIEW COMPARISON:  Lumbar spine MRI 10/27/2017, CT 10/19/2017 FINDINGS: Unchanged rightward scoliotic curvature. Chronic  compression fracture of L3 is stable from prior exam. No new compression fracture. The posterior elements are grossly intact. Known right sacral lesion not well seen radiographically. Catheter tubing projects over the abdomen. IMPRESSION: 1. Chronic L3 logic compression fracture. 2. No evidence of acute fracture. 3. Additional right iliac bone lesions on prior MRI and CT are not well visualized radiographically. Electronically Signed   By: Jeb Levering M.D.   On: 12/09/2017 01:08  Procedures Procedures    Medications Ordered in ED Medications  oxyCODONE-acetaminophen (PERCOCET/ROXICET) 5-325 MG per tablet 2 tablet (2 tablets Oral Given 12/09/17 0027)  sodium chloride 0.9 % bolus 1,000 mL (1,000 mLs Intravenous New Bag/Given 12/09/17 0152)     Initial Impression / Assessment and Plan / ED Course  I have reviewed the triage vital signs and the nursing notes.  Pertinent labs & imaging results that were available during my care of the patient were reviewed by me and considered in my medical decision making (see chart for details).     12:53 AM Spoke to nursing facility,they report patient fell out of bed dislodging his right nephrostomy tube He also reports he has had some confusion, but this is happened previously Imaging pending at this time and I will likely discuss with urology 2:02 AM I spoke to Dr. [name] with urology He reports that replacement of his nephrostomy tube is not an emergent issue Can follow-up as an outpatient He is still tachycardic, will place an IV and give IV fluids and reassess 2:52 AM Vitals improved, patient is awake alert with mild tremors Labs and imaging showed no acute change Will discharge back to [facility], can follow-up as an outpatient for nephrostomy replacement Final Clinical Impressions(s) / ED Diagnoses   Final diagnoses:  Fall, initial encounter  Back strain, initial encounter    ED Discharge Orders    None       Ripley Fraise,  MD 12/09/17 [phone]

## 2017-12-09 NOTE — ED Notes (Signed)
Report called back to facility 

## 2017-12-09 NOTE — Discharge Instructions (Signed)
Please call urology next week to help assist with replacement of the nephrostomy tube This does not have to happen emergently It may be replaced by interventional radiology

## 2017-12-10 ENCOUNTER — Non-Acute Institutional Stay (SKILLED_NURSING_FACILITY): Payer: Medicaid Other | Admitting: Adult Health

## 2017-12-10 ENCOUNTER — Encounter: Payer: Self-pay | Admitting: Adult Health

## 2017-12-10 DIAGNOSIS — N201 Calculus of ureter: Secondary | ICD-10-CM

## 2017-12-10 NOTE — Progress Notes (Signed)
Location:   [facility] Room Number: 131 A Place of Service:  SNF (31)   CODE STATUS: DNR  Allergies  Allergen Reactions  . Bee Venom Swelling    Chief Complaint  Patient presents with  . Acute Visit    ER follow up    HPI:  He went to the ED following a fall at this facility. His nephrostomy stent became dislodged. The replacement of this tube was deemed not emergent. He will need to urology to determine when this tube will need to be replaced. He tells me that his pain is presently being managed. He states that he is tired from being up all night; but has no other complaints at this time.    Past Medical History:  Diagnosis Date  . Cancer ([place])   . Tendonitis     Past Surgical History:  Procedure Laterality Date  . CYSTOSCOPY W/ URETERAL STENT PLACEMENT Right 08/21/2017   Procedure: CYSTOSCOPY WITH RETROGRADE PYELOGRAM, RIGHT;  Surgeon: Alexis Frock, MD;  Location: [facility];  Service: Urology;  Laterality: Right;  . HERNIA REPAIR  1988   Dr. Vida Rigger  . IR FLUORO GUIDE PORT INSERTION RIGHT  07/26/2017  . IR GASTR TUBE CONVERT GASTR-JEJ PER W/FL MOD SED  11/09/2017  . IR GASTROSTOMY TUBE MOD SED  11/02/2017  . IR NEPHROSTOMY EXCHANGE RIGHT  10/04/2017  . IR NEPHROSTOMY PLACEMENT RIGHT  08/21/2017  . IR US GUIDE VASC ACCESS RIGHT  07/26/2017    Social History   Socioeconomic History  . Marital status: Single    Spouse name: Not on file  . Number of children: 0  . Years of education: Not on file  . Highest education level: Not on file  Social Needs  . Financial resource strain: Not on file  . Food insecurity - worry: Not on file  . Food insecurity - inability: Not on file  . Transportation needs - medical: Not on file  . Transportation needs - non-medical: Not on file  Occupational History  . Not on file  Tobacco Use  . Smoking status: Never Smoker  . Smokeless tobacco: Never Used  Substance and Sexual Activity  . Alcohol use: Yes    Comment:  occasionally/socially  . Drug use: No  . Sexual activity: No  Other Topics Concern  . 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 Never used smokeless tobacco.   No e-cigarettes   Occasional alcohol consumption socially.   Single, no children.   Family History  Problem Relation Age of Onset  . Cancer Father       VITAL SIGNS BP 140/90   Pulse 88   Temp 98.9 F (37.2 C)   Resp (!) 22   Ht 6' 2"  (1.88 m)   Wt 120 lb (54.4 kg)   SpO2 94%   BMI 15.41 kg/m   Outpatient Encounter Medications as of 12/10/2017  Medication Sig  . Amino Acids-Protein Hydrolys (FEEDING SUPPLEMENT, PRO-STAT SUGAR FREE 64,) LIQD Place 30 mLs into feeding tube daily.  Marland Kitchen apixaban (ELIQUIS) 5 MG TABS tablet Take 5 mg by mouth 2 (two) times daily. Starting 11/22/17  . calcium elemental as carbonate (BARIATRIC TUMS ULTRA) 400 MG chewable tablet Chew 3 tablets (1,200 mg total) 3 (three) times daily by mouth.  . chlorhexidine (PERIDEX) 0.12 % solution 15 mLs by Mouth Rinse route 2 (two) times daily.  . Cholecalciferol 50000 units capsule Take 50,000  Units by mouth daily. Every Monday  . collagenase (SANTYL) ointment Apply topically daily. Applied to wounds in his sacral area and also thoracic spine once a day.  . dronabinol (MARINOL) 5 MG capsule Take 1 capsule (5 mg total) by mouth daily before lunch.  . Lidocaine-Prilocaine, Bulk, 2.5-2.5 % CREA Apply to affected areas topically one time daily  . magnesium oxide (MAG-OX) 400 (241.3 Mg) MG tablet Take 1 tablet (400 mg total) by mouth 2 (two) times daily.  . metoCLOPramide (REGLAN) 10 MG tablet Take 1 tablet (10 mg total) by mouth 3 (three) times daily before meals.  . metoprolol tartrate (LOPRESSOR) 25 MG tablet Take 0.5 tablets (12.5 mg total) by mouth 2 (two) times daily.  . Multiple Vitamin (MULTIVITAMIN) LIQD Take 15 mLs by mouth daily.  Marland Kitchen OLANZapine zydis (ZYPREXA) 5 MG disintegrating  tablet Take 0.5 tablets (2.5 mg total) by mouth at bedtime.  Marland Kitchen oxyCODONE (OXYCONTIN) 10 mg 12 hr tablet Take 1 tablet (10 mg total) by mouth every 12 (twelve) hours.  . Oxycodone HCl 10 MG TABS Take 1 tablet (10 mg total) by mouth every 6 (six) hours as needed (for pain).  . pantoprazole (PROTONIX) 40 MG tablet Take 40 mg by mouth daily with lunch.   . polyethylene glycol (MIRALAX / GLYCOLAX) packet Take 17 g daily as needed by mouth for moderate constipation.   . potassium & sodium phosphates (PHOS-NAK) 280-160-250 MG PACK Take 1 packet by mouth 4 (four) times daily -  before meals and at bedtime.  . predniSONE (DELTASONE) 10 MG tablet Take 2 tablets (20 mg total) daily with breakfast by mouth.  . prochlorperazine (COMPAZINE) 10 MG tablet Take 10 mg every 6 (six) hours as needed by mouth for nausea or vomiting.  . senna-docusate (SENOKOT-S) 8.6-50 MG tablet Take 1 tablet by mouth daily as needed for mild constipation.  . sulfamethoxazole-trimethoprim (BACTRIM,SEPTRA) 400-80 MG tablet Take 2 tablets by mouth 3 (three) times a week.  . [DISCONTINUED] apixaban (ELIQUIS) 5 MG TABS tablet Take 2 tabs po BID x7 days, then take 1 tab po BID (Patient not taking: Reported on 12/10/2017)   Facility-Administered Encounter Medications as of 12/10/2017  Medication  . sodium chloride flush (NS) 0.9 % injection 10 mL     SIGNIFICANT DIAGNOSTIC EXAMS   PREVIOUS:   08-21-17:chest x-ray: No active cardiopulmonary disease.  08-21-17: lumbar spine complete x-ray: There is again noted fracture L3 vertebral body. Tumor extends into and involves the left L3 pedicle. This finding was present on prior lumbar MR. No new bone lesions are evident. No new fracture. No spondylolisthesis. There is facet osteoarthritic change at L4-5 L5-S1 bilaterally. There is also loss of the pedicle on the right at T12, indicative of bony metastatic disease in this area.  08-21-17: thoracic spine x-ray: No fracture or spondylolisthesis.  There are multiple metastatic foci Seen in the thoracic spine performed three months prior. By radiography, there is loss of the pedicle of T12 on the right and subtle loss of the lateral aspect of the pedicle of T8 on the left. 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 No other neoplastic foci identified by radiography the bony structures. Note that there are multiple foci of tumor and thoracic vertebral bodies seen on recent MR not seen by radiography currently.   08-22-17: placement of right nephrostomy placement  10-03-17: chest x-ray: No active cardiopulmonary disease.   10-03-17: ct angio of chest:  1. Acute pulmonary embolism within the LEFT  lower lobe and lingula. These small emboli are nonocclusive. Overall clot burden is minimal. 2. No evidence of RIGHT ventricular strain. 3. Patchy airspace disease in the LEFT lower lobe. Favor pulmonary infection such as pneumonia or aspiration rather than pulmonary infarction. 4. Frothy material within the LEFT lower lobe bronchus could represent aspirate from tracheal aspiration.  10-04-17 right nephrostomy stent replacement   10-16-17: renal ultrasound:  No etiology for the bloody nephrostomy tube drainage is observed. The proximal pigtail of the nephrostomy tube appears to lie in the mid renal collecting system. No hydronephrosis on the right or left.  10-19-17: ct of neck:  1. No prior head or neck CT. 2. 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 Associated pathologic fractures of the right C1 lateral mass and the C4 vertebral body. 3. No lymphadenopathy in the neck. 4. There is a 2 cm circumscribed cyst in the posterior right hypopharynx contiguous with the right AE fold which appears to be new since the CTA chest 10/03/2017. Burtis Junes this is a benign retention cyst. 5. Right mastoid effusion, likely reactive to #2.  10-19-17: ct of chest abdomen and pelvis:  1. 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 No new or progressive bony findings. 2. Significant improvement of the retroperitoneal lymph nodes. No new or progressive findings in the abdomen or pelvis. 3. Right-sided externalized nephrostomy tube with obstructing upper ureteral calculi and associated mild to moderate hydroureteronephrosis. 4. Stable lung findings.  10-19-17: NM bone scan whole body: Multiple bony metastases as described. Focal increased activity overlying the right maxilla may represent dental disease or possibly metastasis.  10-27-17: MRI cervical; thoracic; lumbar spine:  1. Multiple vertebral body, posterior element, rib, sacrum, and left scapula enhancing metastasis. 2. No epidural, leptomeningeal, or cord metastasis. 3. Pathologic mild C4, mild T10, mild T12, and moderate L3 compression deformities. 4. Mild bony retropulsion of L3 vertebral body eccentric to left with mild canal and severe left L3-4 foraminal stenosis. 5. Cystic lesion in right posterior hypopharynx measuring up to 22 mm, direct visualization recommended.  TODAY:   12-09-17: chest x-ray: No acute pulmonary process identified. 12-09-17    12-09-17: lumbar spine x-ray:  1. Chronic L3 logic compression fracture. 2. No evidence of acute fracture. 3. Additional right iliac bone lesions on prior MRI and CT are not well visualized radiographically.     LABS REVIEWED: PREVIOUS:   08-21-17: wbc 0.1; hgb 9.8; hct 27.6; mcv 71.7; plt 76; glucose 144 bun 12; creat 0.63; k+ 3.5; na++ 135; ca 6.9; liver normal albumin 3.1; phos 1.9; BNP 607.1; uric acid 3.8 blood culture: enterobacteriaceae; e-coli 08-24-17: wbc 5.3; hgb 8.7; hct 249; mcv 73.7; plt 57; glucose 93; bun 11; creat 0.38; k+ 2.7; na++ 139; ca 6.3; albumin 2.3 08-25-17: blood culture: no growth  08-27-17: wbc 13.4; hgb 8.7; hct 25.2; mcv 73.5; plt 63; glucose 92; bun 7; creat 0.31; k+ 3.0; na++ 140; ca  6.3; alt 73; albumin 2.3  08-30-17: wbc 6.2; hgb 9.0; hct 26.5; mcv 74.9; plt 120; glucose 109; bun <5' creat <0.30; k+ 3.4; na++ 135; ca 6.2; liver normal albumin 2.3; mag 1.6; phos 1.7 09-03-17: wbc 6.3; hgb 10.0; hct 29.5; mcv 74.1; plt 403; glucose 103; bun 8; creat 0.38; k+ 3.1; na++ 136; mag 1.7; phos 1.9  09-17-17: wbc 10.4; hgb 11.6; hct 35.4; mcv 75.0; plt 453; glucose 108; bun 7.3; creat 0.5; k+ 3.5; na++ 135;  ca 6.4; total bili 1.26; albumin 2.4; uric acid 6.8; mag 1.6; phos 1.9 09-21-17: wbc 30.8; hgb 8.4; hct 26.0; mcv 78.3; plt 252; uric acid 1.9; mag 1.7; phos 1.6 09-24-17: wbc 13.7; hgb 8.0; hct 26.1; mcv 78.4; plt 145; glucose 87; bun <5; creat 0.30; k+ 4.3; na+= 137; ca 6.5; ast 103; alt 76; alk phos 195; albumin 2.2; uric acid 1.6; mag 1.7; phos 2.3; vit D 8.3 10-03-17: wbc 3.4; hgb 9.2; hct 29.9; mcv 80.8; plt 415; glucose 97; bun 10; creat 0.45; k+ 3.0; na++ 139; ca 6.9; indirect bili 1.3; alk phos 158 albumin 2.8; mag 2.0; phos 1.6; blood culture: no growth; urine culture: 50,000 e-coli 10-05-17: wbc 2.9; hgb 7.8; hct 24.6; mcv 80.7 ;plt 329; glucose 92; bun 9; creat 0.36; k+ 3.0; na++ 140; ca 6.4; mag 1.7; phos 1.8; ionized calcium 3.5 10-08-17: wbc 3.2; hgb 9.0; hct 28.6; mcv 81.7; plt 318; glucose 99; bun <5; creat 0.31; k+ 3.5; na++ 140; ca 6.9; liver normal albumin 2.3; mag 1.6; phos 2.4  10-16-17: wbc 9.6; hgb 11.2; hct 35.3; mcv 81.1; plt 378; glucose 90; bun 5; creat 0.38; k+2.7; na++ 138; ca 6.4; total bili 1.5; albumin 2.9; mag 2.2; tsh 2.260 10-23-17: wbc glucose 80; bun <5; creat <0.30; k+ 3.3; na++ 136; ca 5.8 10-26-17: glucose 85; bun <5; creat <0.30; k+3.3; na++ 138; ca 6.6 mag 2.2; phos 2.7; vit D 33.7 11-07-17: wbc 11.8; hgb 8.7; hct 26.8; mcv 84.5, plt 138; glucose 84; bun 12; creat <0.30; k+ 3.6; na++ 136; ca 6.8; albumin 2.0 11-14-17: wbc 11.0; hgb 9.1; hct 28.5; mcv 84.3; plt 376 glucose 104; bun 13; creat <0.30; k+ 4.0; na++ 138; ca 7.7; liver normal albumin 2.3;  mag 2.0; phos 3.7  TODAY:   12-09-17: wbc 17.7; hgb 10.2; hct 32.2; mcv 81.9 ;plt 403; glucose 110; bun 16; creat 0.36; k+ 4.0; na++ 136 ca 8.6      Review of Systems  Constitutional: Negative for malaise/fatigue.  Respiratory: Negative for cough and shortness of breath.   Cardiovascular: Negative for chest pain, palpitations and leg swelling.  Gastrointestinal: Negative for abdominal pain, constipation and heartburn.  Genitourinary:       Nephrostomy stent is out   Musculoskeletal: Negative for back pain, joint pain and myalgias.       Pain is being managed   Skin: Negative.   Neurological: Negative for dizziness.  Psychiatric/Behavioral: The patient is not nervous/anxious.     Physical Exam  Constitutional: He is oriented to person, place, and time. He appears well-developed and well-nourished. No distress.  Frail   Neck: No thyromegaly present.  Cardiovascular: Normal rate, regular rhythm and intact distal pulses.  Murmur heard. 1/6  Pulmonary/Chest: Effort normal and breath sounds normal. No respiratory distress.  Abdominal: Soft. Bowel sounds are normal. He exhibits no distension. There is no tenderness.  J tube present   Musculoskeletal: Normal range of motion. He exhibits no edema.  Lymphadenopathy:    He has no cervical adenopathy.  Neurological: He is alert and oriented to person, place, and time.  Does have some confusion present   Skin: Skin is warm and dry. He is not diaphoretic.  Right chest portacath   Psychiatric: He has a normal mood and affect.      ASSESSMENT/ PLAN:  TODAY:   1. Obstructing right ureteral stone: nephrostomy stent is presently out; will have urology follow up to determine if the tube needs to be replaced; will monitor    MD  is aware of resident's narcotic use and is in agreement with current plan of care. We will attempt to wean resident as apropriate   Ok Edwards NP [facility]  Contact [phone] Monday through  Friday 8am- 5pm  After hours call [phone]

## 2017-12-11 ENCOUNTER — Encounter: Payer: Self-pay | Admitting: Adult Health

## 2017-12-11 ENCOUNTER — Non-Acute Institutional Stay (SKILLED_NURSING_FACILITY): Payer: Medicaid Other | Admitting: Adult Health

## 2017-12-11 DIAGNOSIS — C8338 Diffuse large B-cell lymphoma, lymph nodes of multiple sites: Secondary | ICD-10-CM | POA: Diagnosis not present

## 2017-12-11 DIAGNOSIS — C7951 Secondary malignant neoplasm of bone: Secondary | ICD-10-CM | POA: Diagnosis not present

## 2017-12-11 NOTE — Progress Notes (Signed)
Location:   [place] Room Number: 131 A Place of Service:  SNF (31)   CODE STATUS: DNR  Allergies  Allergen Reactions  . Bee Venom Swelling    Chief Complaint  Patient presents with  . Acute Visit    Care Plan Meeting    HPI:  We have come together for his care plan meeting. His brother is present. We did discuss his overall health status which is extremely poor. He is continuing to lose weight despite being on a tube feeding. His caloric needs are too great. His vital signs are variable. Going home is not a possibility. His son does understand this. He does have family who are a significant distance away; we have talked; they will need to come visit soon; as he is getting more confused. Therapy will be stepping out of; therapy is no longer making progress. We did discuss his health status; he is no longer a candidate for radiation or chemo. His brother would like for hospice care to step in at this time.  His brother does not want to be present when this discussion will take place. Through agreement with the palliative care provider; and family will discuss this with him in the AM.   Past Medical History:  Diagnosis Date  . Cancer ([place])   . Tendonitis     Past Surgical History:  Procedure Laterality Date  . CYSTOSCOPY W/ URETERAL STENT PLACEMENT Right 08/21/2017   Procedure: CYSTOSCOPY WITH RETROGRADE PYELOGRAM, RIGHT;  Surgeon: Alexis Frock, MD;  Location: [facility];  Service: Urology;  Laterality: Right;  . HERNIA REPAIR  1988   Dr. Vida Rigger  . IR FLUORO GUIDE PORT INSERTION RIGHT  07/26/2017  . IR GASTR TUBE CONVERT GASTR-JEJ PER W/FL MOD SED  11/09/2017  . IR GASTROSTOMY TUBE MOD SED  11/02/2017  . IR NEPHROSTOMY EXCHANGE RIGHT  10/04/2017  . IR NEPHROSTOMY PLACEMENT RIGHT  08/21/2017  . IR US GUIDE VASC ACCESS RIGHT  07/26/2017    Social History   Socioeconomic History  . Marital status: Single    Spouse name: Not on file  . Number of children: 0  . Years  of education: Not on file  . Highest education level: Not on file  Social Needs  . Financial resource strain: Not on file  . Food insecurity - worry: Not on file  . Food insecurity - inability: Not on file  . Transportation needs - medical: Not on file  . Transportation needs - non-medical: Not on file  Occupational History  . Not on file  Tobacco Use  . Smoking status: Never Smoker  . Smokeless tobacco: Never Used  Substance and Sexual Activity  . Alcohol use: Yes    Comment: occasionally/socially  . Drug use: No  . Sexual activity: No  Other Topics Concern  . 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 Never used smokeless tobacco.   No e-cigarettes   Occasional alcohol consumption socially.   Single, no children.   Family History  Problem Relation Age of Onset  . Cancer Father       VITAL SIGNS BP 140/90   Pulse 88   Temp 98.9 F (37.2 C)   Resp (!) 22   Ht _0  (1.88 m)   Wt 120 lb (54.4 kg)   SpO2 94%   BMI 15.41 kg/m   Outpatient Encounter Medications as of 12/11/2017  Medication Sig  .  Amino Acids-Protein Hydrolys (FEEDING SUPPLEMENT, PRO-STAT SUGAR FREE 64,) LIQD Place 30 mLs into feeding tube daily.  Marland Kitchen apixaban (ELIQUIS) 5 MG TABS tablet Take 5 mg by mouth 2 (two) times daily. Starting 11/22/17  . calcium elemental as carbonate (BARIATRIC TUMS ULTRA) 400 MG chewable tablet Chew 3 tablets (1,200 mg total) 3 (three) times daily by mouth.  . chlorhexidine (PERIDEX) 0.12 % solution 15 mLs by Mouth Rinse route 2 (two) times daily.  . Cholecalciferol 50000 units capsule Take 50,000 Units by mouth daily. Every Monday  . collagenase (SANTYL) ointment Apply topically daily. Applied to wounds in his sacral area and also thoracic spine once a day.  . dronabinol (MARINOL) 5 MG capsule Take 1 capsule (5 mg total) by mouth daily before lunch.  . Lidocaine-Prilocaine, Bulk, 2.5-2.5 % CREA Apply to  affected areas topically one time daily  . magnesium oxide (MAG-OX) 400 (241.3 Mg) MG tablet Take 1 tablet (400 mg total) by mouth 2 (two) times daily.  . metoCLOPramide (REGLAN) 10 MG tablet Take 1 tablet (10 mg total) by mouth 3 (three) times daily before meals.  . metoprolol tartrate (LOPRESSOR) 25 MG tablet Take 0.5 tablets (12.5 mg total) by mouth 2 (two) times daily.  . Multiple Vitamin (MULTIVITAMIN) LIQD Take 15 mLs by mouth daily.  Marland Kitchen OLANZapine zydis (ZYPREXA) 5 MG disintegrating tablet Take 0.5 tablets (2.5 mg total) by mouth at bedtime.  Marland Kitchen oxyCODONE (OXYCONTIN) 10 mg 12 hr tablet Take 1 tablet (10 mg total) by mouth every 12 (twelve) hours.  . Oxycodone HCl 10 MG TABS Take 1 tablet (10 mg total) by mouth every 6 (six) hours as needed (for pain).  . pantoprazole (PROTONIX) 40 MG tablet Take 40 mg by mouth daily with lunch.   . polyethylene glycol (MIRALAX / GLYCOLAX) packet Take 17 g daily as needed by mouth for moderate constipation.   . potassium & sodium phosphates (PHOS-NAK) 280-160-250 MG PACK Take 1 packet by mouth 4 (four) times daily -  before meals and at bedtime.  . predniSONE (DELTASONE) 10 MG tablet Take 2 tablets (20 mg total) daily with breakfast by mouth.  . prochlorperazine (COMPAZINE) 10 MG tablet Take 10 mg every 6 (six) hours as needed by mouth for nausea or vomiting.  . senna-docusate (SENOKOT-S) 8.6-50 MG tablet Take 1 tablet by mouth daily as needed for mild constipation.  . sulfamethoxazole-trimethoprim (BACTRIM,SEPTRA) 400-80 MG tablet Take 2 tablets by mouth 3 (three) times a week.   Facility-Administered Encounter Medications as of 12/11/2017  Medication  . sodium chloride flush (NS) 0.9 % injection 10 mL     SIGNIFICANT DIAGNOSTIC EXAMS PREVIOUS:   08-21-17:chest x-ray: No active cardiopulmonary disease.  08-21-17: lumbar spine complete x-ray: There is again noted fracture L3 vertebral body. Tumor extends into and involves the left L3 pedicle. This finding  was present on prior lumbar MR. No new bone lesions are evident. No new fracture. No spondylolisthesis. There is facet osteoarthritic change at L4-5 L5-S1 bilaterally. There is also loss of the pedicle on the right at T12, indicative of bony metastatic disease in this area.  08-21-17: thoracic spine x-ray: No fracture or spondylolisthesis. There are multiple metastatic foci Seen in the thoracic spine performed three months prior. By radiography, there is loss of the pedicle of T12 on the right and subtle loss of the lateral aspect of the pedicle of T8 on the left. 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 No other neoplastic foci identified by  radiography the bony structures. Note that there are multiple foci of tumor and thoracic vertebral bodies seen on recent MR not seen by radiography currently.   08-22-17: placement of right nephrostomy placement  10-03-17: chest x-ray: No active cardiopulmonary disease.   10-03-17: ct angio of chest:  1. Acute pulmonary embolism within the LEFT lower lobe and lingula. These small emboli are nonocclusive. Overall clot burden is minimal. 2. No evidence of RIGHT ventricular strain. 3. Patchy airspace disease in the LEFT lower lobe. Favor pulmonary infection such as pneumonia or aspiration rather than pulmonary infarction. 4. Frothy material within the LEFT lower lobe bronchus could represent aspirate from tracheal aspiration.  10-04-17 right nephrostomy stent replacement   10-16-17: renal ultrasound:  No etiology for the bloody nephrostomy tube drainage is observed. The proximal pigtail of the nephrostomy tube appears to lie in the mid renal collecting system. No hydronephrosis on the right or left.  10-19-17: ct of neck:  1. No prior head or neck CT. 2. 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 Associated pathologic fractures of the right C1 lateral mass and the C4 vertebral body. 3. No  lymphadenopathy in the neck. 4. There is a 2 cm circumscribed cyst in the posterior right hypopharynx contiguous with the right AE fold which appears to be new since the CTA chest 10/03/2017. Burtis Junes this is a benign retention cyst. 5. Right mastoid effusion, likely reactive to #2.  10-19-17: ct of chest abdomen and pelvis:  1. Overall improved CT appearance of the patient's lymphoma most significantly involving the osseous structures. No new or progressive bony findings. 2. Significant improvement of the retroperitoneal lymph nodes. No new or progressive findings in the abdomen or pelvis. 3. Right-sided externalized nephrostomy tube with obstructing upper ureteral calculi and associated mild to moderate hydroureteronephrosis. 4. Stable lung findings.  10-19-17: NM bone scan whole body: Multiple bony metastases as described. Focal increased activity overlying the right maxilla may represent dental disease or possibly metastasis.  10-27-17: MRI cervical; thoracic; lumbar spine:  1. Multiple vertebral body, posterior element, rib, sacrum, and left scapula enhancing metastasis. 2. No epidural, leptomeningeal, or cord metastasis. 3. Pathologic mild C4, mild T10, mild T12, and moderate L3 compression deformities. 4. Mild bony retropulsion of L3 vertebral body eccentric to left with mild canal and severe left L3-4 foraminal stenosis. 5. Cystic lesion in right posterior hypopharynx measuring up to 22 mm, direct visualization recommended.  TODAY:   12-09-17: chest x-ray: No acute pulmonary process identified. 12-09-17    12-09-17: lumbar spine x-ray:  1. Chronic L3 logic compression fracture. 2. No evidence of acute fracture. 3. Additional right iliac bone lesions on prior MRI and CT are not well visualized radiographically.     LABS REVIEWED: PREVIOUS:   08-21-17: wbc 0.1; hgb 9.8; hct 27.6; mcv 71.7; plt 76; glucose 144 bun 12; creat 0.63; k+ 3.5; na++ 135; ca 6.9; liver normal albumin 3.1; phos  1.9; BNP 607.1; uric acid 3.8 blood culture: enterobacteriaceae; e-coli 08-24-17: wbc 5.3; hgb 8.7; hct 249; mcv 73.7; plt 57; glucose 93; bun 11; creat 0.38; k+ 2.7; na++ 139; ca 6.3; albumin 2.3 08-25-17: blood culture: no growth  08-27-17: wbc 13.4; hgb 8.7; hct 25.2; mcv 73.5; plt 63; glucose 92; bun 7; creat 0.31; k+ 3.0; na++ 140; ca 6.3; alt 73; albumin 2.3  08-30-17: wbc 6.2; hgb 9.0; hct 26.5; mcv 74.9; plt 120; glucose 109; bun <5' creat <0.30; k+ 3.4; na++ 135; ca  6.2; liver normal albumin 2.3; mag 1.6; phos 1.7 09-03-17: wbc 6.3; hgb 10.0; hct 29.5; mcv 74.1; plt 403; glucose 103; bun 8; creat 0.38; k+ 3.1; na++ 136; mag 1.7; phos 1.9  09-17-17: wbc 10.4; hgb 11.6; hct 35.4; mcv 75.0; plt 453; glucose 108; bun 7.3; creat 0.5; k+ 3.5; na++ 135; ca 6.4; total bili 1.26; albumin 2.4; uric acid 6.8; mag 1.6; phos 1.9 09-21-17: wbc 30.8; hgb 8.4; hct 26.0; mcv 78.3; plt 252; uric acid 1.9; mag 1.7; phos 1.6 09-24-17: wbc 13.7; hgb 8.0; hct 26.1; mcv 78.4; plt 145; glucose 87; bun <5; creat 0.30; k+ 4.3; na+= 137; ca 6.5; ast 103; alt 76; alk phos 195; albumin 2.2; uric acid 1.6; mag 1.7; phos 2.3; vit D 8.3 10-03-17: wbc 3.4; hgb 9.2; hct 29.9; mcv 80.8; plt 415; glucose 97; bun 10; creat 0.45; k+ 3.0; na++ 139; ca 6.9; indirect bili 1.3; alk phos 158 albumin 2.8; mag 2.0; phos 1.6; blood culture: no growth; urine culture: 50,000 e-coli 10-05-17: wbc 2.9; hgb 7.8; hct 24.6; mcv 80.7 ;plt 329; glucose 92; bun 9; creat 0.36; k+ 3.0; na++ 140; ca 6.4; mag 1.7; phos 1.8; ionized calcium 3.5 10-08-17: wbc 3.2; hgb 9.0; hct 28.6; mcv 81.7; plt 318; glucose 99; bun <5; creat 0.31; k+ 3.5; na++ 140; ca 6.9; liver normal albumin 2.3; mag 1.6; phos 2.4  10-16-17: wbc 9.6; hgb 11.2; hct 35.3; mcv 81.1; plt 378; glucose 90; bun 5; creat 0.38; k+2.7; na++ 138; ca 6.4; total bili 1.5; albumin 2.9; mag 2.2; tsh 2.260 10-23-17: wbc glucose 80; bun <5; creat <0.30; k+ 3.3; na++ 136; ca 5.8 10-26-17: glucose 85; bun <5;  creat <0.30; k+3.3; na++ 138; ca 6.6 mag 2.2; phos 2.7; vit D 33.7 11-07-17: wbc 11.8; hgb 8.7; hct 26.8; mcv 84.5, plt 138; glucose 84; bun 12; creat <0.30; k+ 3.6; na++ 136; ca 6.8; albumin 2.0 11-14-17: wbc 11.0; hgb 9.1; hct 28.5; mcv 84.3; plt 376 glucose 104; bun 13; creat <0.30; k+ 4.0; na++ 138; ca 7.7; liver normal albumin 2.3; mag 2.0; phos 3.7 12-09-17: wbc 17.7; hgb 10.2; hct 32.2; mcv 81.9 ;plt 403; glucose 110; bun 16; creat 0.36; k+ 4.0; na++ 136 ca 8.6     NO NEW LABS    Review of Systems  Constitutional: Negative for malaise/fatigue.  Respiratory: Negative for cough and shortness of breath.   Cardiovascular: Negative for chest pain, palpitations and leg swelling.  Gastrointestinal: Negative for abdominal pain, constipation and heartburn.  Musculoskeletal: Negative for back pain, joint pain and myalgias.       Pain is presently being managed   Skin: Negative.   Neurological: Negative for dizziness.  Psychiatric/Behavioral: The patient is not nervous/anxious.    Physical Exam  Constitutional: He is oriented to person, place, and time. No distress.  Frail   Neck: No thyromegaly present.  Cardiovascular: Normal rate, regular rhythm and intact distal pulses.  Murmur heard. 1/6  Pulmonary/Chest: Effort normal and breath sounds normal. No respiratory distress.  Abdominal: Soft. Bowel sounds are normal. He exhibits no distension. There is no tenderness.  J tube present   Musculoskeletal: He exhibits no edema.  Is able to move all extremities   Lymphadenopathy:    He has no cervical adenopathy.  Neurological: He is alert and oriented to person, place, and time.  Does have some confusion present   Skin: Skin is warm and dry. He is not diaphoretic.  Right chest wall portcath   Psychiatric: He has a normal  mood and affect.    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 Has verbalized understanding.   MD is aware of resident's narcotic use and is in agreement with current plan of care. We will attempt to wean resident as apropriate   Ok Edwards NP [facility]  Contact [phone] Monday through Friday 8am- 5pm  After hours call [phone]

## 2017-12-12 ENCOUNTER — Encounter: Payer: Self-pay | Admitting: Adult Health

## 2017-12-12 ENCOUNTER — Non-Acute Institutional Stay (SKILLED_NURSING_FACILITY): Payer: Medicaid Other | Admitting: Adult Health

## 2017-12-12 DIAGNOSIS — C7951 Secondary malignant neoplasm of bone: Secondary | ICD-10-CM | POA: Diagnosis not present

## 2017-12-12 DIAGNOSIS — C8338 Diffuse large B-cell lymphoma, lymph nodes of multiple sites: Secondary | ICD-10-CM | POA: Diagnosis not present

## 2017-12-12 NOTE — Progress Notes (Signed)
Location:   [facility] Room Number: 131 A Place of Service:  SNF (31)   CODE STATUS: DNR  Allergies  Allergen Reactions  . Bee Venom Swelling    Chief Complaint  Patient presents with  . Acute Visit    Care Plan Meeting    HPI:  Myself and the palliative care NP have met with him regarding his overall status and goals of care. He would like to continue to get better. He did verbalize understanding that therapy has stepped out. He does understand that getting better is no longer possible for him. He is interested in hospice care; and will change the focus of his care to comfort only. He does understand that he is in a terminal status.   Past Medical History:  Diagnosis Date  . Cancer ([place])   . Tendonitis     Past Surgical History:  Procedure Laterality Date  . CYSTOSCOPY W/ URETERAL STENT PLACEMENT Right 08/21/2017   Procedure: CYSTOSCOPY WITH RETROGRADE PYELOGRAM, RIGHT;  Surgeon: Alexis Frock, MD;  Location: [facility];  Service: Urology;  Laterality: Right;  . HERNIA REPAIR  1988   Dr. Vida Rigger  . IR FLUORO GUIDE PORT INSERTION RIGHT  07/26/2017  . IR GASTR TUBE CONVERT GASTR-JEJ PER W/FL MOD SED  11/09/2017  . IR GASTROSTOMY TUBE MOD SED  11/02/2017  . IR NEPHROSTOMY EXCHANGE RIGHT  10/04/2017  . IR NEPHROSTOMY PLACEMENT RIGHT  08/21/2017  . IR US GUIDE VASC ACCESS RIGHT  07/26/2017    Social History   Socioeconomic History  . Marital status: Single    Spouse name: Not on file  . Number of children: 0  . Years of education: Not on file  . Highest education level: Not on file  Social Needs  . Financial resource strain: Not on file  . Food insecurity - worry: Not on file  . Food insecurity - inability: Not on file  . Transportation needs - medical: Not on file  . Transportation needs - non-medical: Not on file  Occupational History  . Not on file  Tobacco Use  . Smoking status: Never Smoker  . Smokeless tobacco: Never Used  Substance and Sexual  Activity  . Alcohol use: Yes    Comment: occasionally/socially  . Drug use: No  . Sexual activity: No  Other Topics Concern  . 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 Never used smokeless tobacco.   No e-cigarettes   Occasional alcohol consumption socially.   Single, no children.   Family History  Problem Relation Age of Onset  . Cancer Father       VITAL SIGNS BP 140/90   Pulse 88   Temp 98.9 F (37.2 C)   Resp (!) 22   Ht _0  (1.88 m)   Wt 120 lb (54.4 kg)   SpO2 94%   BMI 15.41 kg/m   Outpatient Encounter Medications as of 12/12/2017  Medication Sig  . apixaban (ELIQUIS) 5 MG TABS tablet Take 5 mg by mouth 2 (two) times daily. Starting 11/22/17  . calcium elemental as carbonate (BARIATRIC TUMS ULTRA) 400 MG chewable tablet Chew 3 tablets (1,200 mg total) 3 (three) times daily by mouth.  . chlorhexidine (PERIDEX) 0.12 % solution 15 mLs by Mouth Rinse route 2 (two) times daily.  . Cholecalciferol 50000 units capsule Take 50,000 Units by mouth daily. Every Monday  . collagenase (SANTYL) ointment Apply topically daily. Applied  to wounds in his sacral area and also thoracic spine once a day.  . dronabinol (MARINOL) 5 MG capsule Take 1 capsule (5 mg total) by mouth daily before lunch.  . Lidocaine-Prilocaine, Bulk, 2.5-2.5 % CREA Apply to affected areas topically one time daily  . magnesium oxide (MAG-OX) 400 (241.3 Mg) MG tablet Take 1 tablet (400 mg total) by mouth 2 (two) times daily.  . metoCLOPramide (REGLAN) 10 MG tablet Take 1 tablet (10 mg total) by mouth 3 (three) times daily before meals.  . metoprolol tartrate (LOPRESSOR) 25 MG tablet Take 0.5 tablets (12.5 mg total) by mouth 2 (two) times daily.  . Multiple Vitamin (MULTIVITAMIN) LIQD Take 15 mLs by mouth daily.  Marland Kitchen OLANZapine zydis (ZYPREXA) 5 MG disintegrating tablet Take 0.5 tablets (2.5 mg total) by mouth at bedtime.  Marland Kitchen oxyCODONE  (OXYCONTIN) 10 mg 12 hr tablet Take 1 tablet (10 mg total) by mouth every 12 (twelve) hours.  . Oxycodone HCl 10 MG TABS Take 1 tablet (10 mg total) by mouth every 6 (six) hours as needed (for pain).  . pantoprazole (PROTONIX) 40 MG tablet Take 40 mg by mouth daily with lunch.   . polyethylene glycol (MIRALAX / GLYCOLAX) packet Take 17 g daily as needed by mouth for moderate constipation.   . potassium & sodium phosphates (PHOS-NAK) 280-160-250 MG PACK Take 1 packet by mouth 4 (four) times daily -  before meals and at bedtime.  . predniSONE (DELTASONE) 10 MG tablet Take 2 tablets (20 mg total) daily with breakfast by mouth.  . prochlorperazine (COMPAZINE) 10 MG tablet Take 10 mg every 6 (six) hours as needed by mouth for nausea or vomiting.  . senna-docusate (SENOKOT-S) 8.6-50 MG tablet Take 1 tablet by mouth daily as needed for mild constipation.  . sulfamethoxazole-trimethoprim (BACTRIM,SEPTRA) 400-80 MG tablet Take 2 tablets by mouth 3 (three) times a week.  . Amino Acids-Protein Hydrolys (FEEDING SUPPLEMENT, PRO-STAT SUGAR FREE 64,) LIQD Place 30 mLs into feeding tube daily.   Facility-Administered Encounter Medications as of 12/12/2017  Medication  . sodium chloride flush (NS) 0.9 % injection 10 mL     SIGNIFICANT DIAGNOSTIC EXAMS  PREVIOUS:   08-21-17:chest x-ray: No active cardiopulmonary disease.  08-21-17: lumbar spine complete x-ray: There is again noted fracture L3 vertebral body. Tumor extends into and involves the left L3 pedicle. This finding was present on prior lumbar MR. No new bone lesions are evident. No new fracture. No spondylolisthesis. There is facet osteoarthritic change at L4-5 L5-S1 bilaterally. There is also loss of the pedicle on the right at T12, indicative of bony metastatic disease in this area.  08-21-17: thoracic spine x-ray: No fracture or spondylolisthesis. There are multiple metastatic foci Seen in the thoracic spine performed three months prior. By  radiography, there is loss of the pedicle of T12 on the right and subtle loss of the lateral aspect of the pedicle of T8 on the left. 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 No other neoplastic foci identified by radiography the bony structures. Note that there are multiple foci of tumor and thoracic vertebral bodies seen on recent MR not seen by radiography currently.   08-22-17: placement of right nephrostomy placement  10-03-17: chest x-ray: No active cardiopulmonary disease.   10-03-17: ct angio of chest:  1. Acute pulmonary embolism within the LEFT lower lobe and lingula. These small emboli are nonocclusive. Overall clot burden is minimal. 2. No evidence of RIGHT ventricular strain. 3. Patchy airspace disease  in the LEFT lower lobe. Favor pulmonary infection such as pneumonia or aspiration rather than pulmonary infarction. 4. Frothy material within the LEFT lower lobe bronchus could represent aspirate from tracheal aspiration.  10-04-17 right nephrostomy stent replacement   10-16-17: renal ultrasound:  No etiology for the bloody nephrostomy tube drainage is observed. The proximal pigtail of the nephrostomy tube appears to lie in the mid renal collecting system. No hydronephrosis on the right or left.  10-19-17: ct of neck:  1. No prior head or neck CT. 2. 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 Associated pathologic fractures of the right C1 lateral mass and the C4 vertebral body. 3. No lymphadenopathy in the neck. 4. There is a 2 cm circumscribed cyst in the posterior right hypopharynx contiguous with the right AE fold which appears to be new since the CTA chest 10/03/2017. Burtis Junes this is a benign retention cyst. 5. Right mastoid effusion, likely reactive to #2.  10-19-17: ct of chest abdomen and pelvis:  1. Overall improved CT appearance of the patient's lymphoma most significantly involving the osseous  structures. No new or progressive bony findings. 2. Significant improvement of the retroperitoneal lymph nodes. No new or progressive findings in the abdomen or pelvis. 3. Right-sided externalized nephrostomy tube with obstructing upper ureteral calculi and associated mild to moderate hydroureteronephrosis. 4. Stable lung findings.  10-19-17: NM bone scan whole body: Multiple bony metastases as described. Focal increased activity overlying the right maxilla may represent dental disease or possibly metastasis.  10-27-17: MRI cervical; thoracic; lumbar spine:  1. Multiple vertebral body, posterior element, rib, sacrum, and left scapula enhancing metastasis. 2. No epidural, leptomeningeal, or cord metastasis. 3. Pathologic mild C4, mild T10, mild T12, and moderate L3 compression deformities. 4. Mild bony retropulsion of L3 vertebral body eccentric to left with mild canal and severe left L3-4 foraminal stenosis. 5. Cystic lesion in right posterior hypopharynx measuring up to 22 mm, direct visualization recommended.  12-09-17: chest x-ray: No acute pulmonary process identified. 12-09-17    12-09-17: lumbar spine x-ray:  1. Chronic L3 logic compression fracture. 2. No evidence of acute fracture. 3. Additional right iliac bone lesions on prior MRI and CT are not well visualized radiographically.  NO NEW EXAMS     LABS REVIEWED: PREVIOUS:   08-21-17: wbc 0.1; hgb 9.8; hct 27.6; mcv 71.7; plt 76; glucose 144 bun 12; creat 0.63; k+ 3.5; na++ 135; ca 6.9; liver normal albumin 3.1; phos 1.9; BNP 607.1; uric acid 3.8 blood culture: enterobacteriaceae; e-coli 08-24-17: wbc 5.3; hgb 8.7; hct 249; mcv 73.7; plt 57; glucose 93; bun 11; creat 0.38; k+ 2.7; na++ 139; ca 6.3; albumin 2.3 08-25-17: blood culture: no growth  08-27-17: wbc 13.4; hgb 8.7; hct 25.2; mcv 73.5; plt 63; glucose 92; bun 7; creat 0.31; k+ 3.0; na++ 140; ca 6.3; alt 73; albumin 2.3  08-30-17: wbc 6.2; hgb 9.0; hct 26.5; mcv 74.9; plt 120;  glucose 109; bun <5' creat <0.30; k+ 3.4; na++ 135; ca 6.2; liver normal albumin 2.3; mag 1.6; phos 1.7 09-03-17: wbc 6.3; hgb 10.0; hct 29.5; mcv 74.1; plt 403; glucose 103; bun 8; creat 0.38; k+ 3.1; na++ 136; mag 1.7; phos 1.9  09-17-17: wbc 10.4; hgb 11.6; hct 35.4; mcv 75.0; plt 453; glucose 108; bun 7.3; creat 0.5; k+ 3.5; na++ 135; ca 6.4; total bili 1.26; albumin 2.4; uric acid 6.8; mag 1.6; phos 1.9 09-21-17: wbc 30.8; hgb 8.4; hct 26.0; mcv 78.3; plt  252; uric acid 1.9; mag 1.7; phos 1.6 09-24-17: wbc 13.7; hgb 8.0; hct 26.1; mcv 78.4; plt 145; glucose 87; bun <5; creat 0.30; k+ 4.3; na+= 137; ca 6.5; ast 103; alt 76; alk phos 195; albumin 2.2; uric acid 1.6; mag 1.7; phos 2.3; vit D 8.3 10-03-17: wbc 3.4; hgb 9.2; hct 29.9; mcv 80.8; plt 415; glucose 97; bun 10; creat 0.45; k+ 3.0; na++ 139; ca 6.9; indirect bili 1.3; alk phos 158 albumin 2.8; mag 2.0; phos 1.6; blood culture: no growth; urine culture: 50,000 e-coli 10-05-17: wbc 2.9; hgb 7.8; hct 24.6; mcv 80.7 ;plt 329; glucose 92; bun 9; creat 0.36; k+ 3.0; na++ 140; ca 6.4; mag 1.7; phos 1.8; ionized calcium 3.5 10-08-17: wbc 3.2; hgb 9.0; hct 28.6; mcv 81.7; plt 318; glucose 99; bun <5; creat 0.31; k+ 3.5; na++ 140; ca 6.9; liver normal albumin 2.3; mag 1.6; phos 2.4  10-16-17: wbc 9.6; hgb 11.2; hct 35.3; mcv 81.1; plt 378; glucose 90; bun 5; creat 0.38; k+2.7; na++ 138; ca 6.4; total bili 1.5; albumin 2.9; mag 2.2; tsh 2.260 10-23-17: wbc glucose 80; bun <5; creat <0.30; k+ 3.3; na++ 136; ca 5.8 10-26-17: glucose 85; bun <5; creat <0.30; k+3.3; na++ 138; ca 6.6 mag 2.2; phos 2.7; vit D 33.7 11-07-17: wbc 11.8; hgb 8.7; hct 26.8; mcv 84.5, plt 138; glucose 84; bun 12; creat <0.30; k+ 3.6; na++ 136; ca 6.8; albumin 2.0 11-14-17: wbc 11.0; hgb 9.1; hct 28.5; mcv 84.3; plt 376 glucose 104; bun 13; creat <0.30; k+ 4.0; na++ 138; ca 7.7; liver normal albumin 2.3; mag 2.0; phos 3.7 12-09-17: wbc 17.7; hgb 10.2; hct 32.2; mcv 81.9 ;plt 403; glucose  110; bun 16; creat 0.36; k+ 4.0; na++ 136 ca 8.6     NO NEW LABS    Review of Systems  Constitutional: Negative for malaise/fatigue.  Respiratory: Negative for cough and shortness of breath.   Cardiovascular: Negative for chest pain, palpitations and leg swelling.  Gastrointestinal: Negative for abdominal pain, constipation and heartburn.  Musculoskeletal: Negative for back pain, joint pain and myalgias.  Skin: Negative.   Neurological: Negative for dizziness.  Psychiatric/Behavioral: The patient is not nervous/anxious.     Physical Exam  Constitutional: He is oriented to person, place, and time. No distress.  Frail   Neck: No thyromegaly present.  Cardiovascular: Normal rate, regular rhythm and intact distal pulses.  Murmur heard. 1/6  Pulmonary/Chest: Effort normal and breath sounds normal. No respiratory distress.  Abdominal: Soft. Bowel sounds are normal. He exhibits no distension. There is no tenderness.  J tube present no signs of infection present   Musculoskeletal: He exhibits no edema.  Is able to move all extremities   Lymphadenopathy:    He has no cervical adenopathy.  Neurological: He is alert and oriented to person, place, and time.  Has periods of confusion   Skin: Skin is warm and dry. He is not diaphoretic.  Right chest wall portacath  Right medal back abrasion: 5.0 x 4.2 x 0.1 cm Left lateral back abrasion: 1.0 x 3.5 x 0.1 cm Right posterior ankle: stage III: 0.6 x 0.5 x NM cm  Left back stage IV: 9.4 x 3.7 x 0.5 cm Sacrum stage IV: 4.4 x 3.1 x 0.7 cm   Psychiatric: He has a normal mood and affect.      ASSESSMENT/ PLAN:  TODAY:   1. Bony metastasis 2. Diffuse large B cell lymphoma   Will setup hospice consult Will stop the following: prostat; vit  D; mag ox; lopressor; mvi; phonak   Time spent with patient: 35 minutes: discussed his overall status; poor prognosis; goals of care and treatment options. Verbalized understanding  MD is aware of  resident's narcotic use and is in agreement with current plan of care. We will attempt to wean resident as apropriate   Ok Edwards NP [facility]  Contact [phone] Monday through Friday 8am- 5pm  After hours call [phone]

## 2017-12-13 ENCOUNTER — Encounter (HOSPITAL_COMMUNITY): Payer: Self-pay

## 2017-12-13 ENCOUNTER — Emergency Department (HOSPITAL_COMMUNITY)
Admission: EM | Admit: 2017-12-13 | Discharge: 2017-12-13 | Disposition: A | Discharge status: Home / Self Care | Payer: Medicaid Other | Attending: Emergency Medicine | Admitting: Emergency Medicine

## 2017-12-13 DIAGNOSIS — Z79899 Other long term (current) drug therapy: Secondary | ICD-10-CM | POA: Insufficient documentation

## 2017-12-13 DIAGNOSIS — T85598A Other mechanical complication of other gastrointestinal prosthetic devices, implants and grafts, initial encounter: Secondary | ICD-10-CM | POA: Diagnosis present

## 2017-12-13 DIAGNOSIS — Y733 Surgical instruments, materials and gastroenterology and urology devices (including sutures) associated with adverse incidents: Secondary | ICD-10-CM | POA: Insufficient documentation

## 2017-12-13 NOTE — ED Provider Notes (Signed)
[place] [facility] DEPT Provider Note   CSN: [id number] Arrival date & time: 12/13/17  0506     History   Chief Complaint Chief Complaint  Patient presents with  . clogged feeding tube    HPI Cory Ibarra is a 57 y.o. male.  Pt presents to the ED today with a clogged g tube.  The facility tried to unclog with coke and water, but was unsuccessful.  Pt denies any new pain.      Past Medical History:  Diagnosis Date  . Cancer ([place])   . Tendonitis     Patient Active Problem List   Diagnosis Date Noted  . Calculus of ureter   . Malnutrition ([place])   . Aspiration pneumonia due to regurgitated food ([place])   . Goals of care, counseling/discussion   . Sinus tachycardia 11/06/2017  . Lymphoma ([place]) 11/06/2017  . Lytic bone lesions on xray 11/06/2017  . Pressure injury of skin 10/04/2017  . Dehydration   . Hypoxia   . Pulmonary emboli ([place]) 10/03/2017  . Healthcare-associated pneumonia   . Obstructive uropathy   . Liver function test abnormality   . Renal calculus or stone 09/19/2017  . Leakage of nephrostomy catheter ([place]) 09/19/2017  . 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 Gastroesophageal reflux disease without esophagitis 09/19/2017  . Hypokalemia 09/18/2017  . Hypomagnesemia 09/18/2017  . Electrolyte abnormality   . Hypovolemia 09/17/2017  . Chemotherapy-induced nausea 09/17/2017  . Constipation 09/17/2017  . DLBCL (diffuse large B cell lymphoma) ([place]) 09/17/2017  . Hypocalcemia 09/17/2017  . Severe protein-calorie malnutrition ([place])   . Adjustment disorder with mixed anxiety and depressed mood   . Palliative care encounter   . Admission for antineoplastic chemotherapy   . Septic shock due to Escherichia coli ([place])   . Neutropenia ([place]) 08/21/2017  . Gross hematuria   . Lactic acidosis   . Neutropenic sepsis ([place])   . Hypophosphatemia 08/20/2017  . Diffuse large B cell lymphoma ([place]) 07/03/2017  . Bony metastasis ([place]) 06/29/2017  .  Chest wall mass   . 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 Closed compression fracture of L3 lumbar vertebra ([place]) 06/28/2017  . Severe back pain 06/28/2017  . Weight loss, unintentional 06/28/2017    Past Surgical History:  Procedure Laterality Date  . CYSTOSCOPY W/ URETERAL STENT PLACEMENT Right 08/21/2017   Procedure: CYSTOSCOPY WITH RETROGRADE PYELOGRAM, RIGHT;  Surgeon: Alexis Frock, MD;  Location: [facility];  Service: Urology;  Laterality: Right;  . HERNIA REPAIR  1988   Dr. Vida Rigger  . IR FLUORO GUIDE PORT INSERTION RIGHT  07/26/2017  . IR GASTR TUBE CONVERT GASTR-JEJ PER W/FL MOD SED  11/09/2017  . IR GASTROSTOMY TUBE MOD SED  11/02/2017  . IR NEPHROSTOMY EXCHANGE RIGHT  10/04/2017  . IR NEPHROSTOMY PLACEMENT RIGHT  08/21/2017  . IR US GUIDE VASC ACCESS RIGHT  07/26/2017       Home Medications    Prior to Admission medications   Medication Sig Start Date End Date Taking? Authorizing Provider  Amino Acids-Protein Hydrolys (FEEDING SUPPLEMENT, PRO-STAT SUGAR FREE 64,) LIQD Place 30 mLs into feeding tube daily. 11/15/17   Barton Dubois, MD  apixaban (ELIQUIS) 5 MG TABS tablet Take 5 mg by mouth 2 (two) times daily. Starting 11/22/17 11/22/17   [provider]  calcium elemental as carbonate (BARIATRIC TUMS ULTRA) 400 MG chewable tablet Chew 3 tablets (1,200 mg total) 3 (three) times daily by mouth. 10/08/17   Patrecia Pour, MD  chlorhexidine (PERIDEX) 0.12 %  solution 15 mLs by Mouth Rinse route 2 (two) times daily. 09/03/17   Theodis Blaze, MD  Cholecalciferol 50000 units capsule Take 50,000 Units by mouth daily. Every Monday    [provider]  collagenase (SANTYL) ointment Apply topically daily. Applied to wounds in his sacral area and also thoracic spine once a day. 11/15/17   Barton Dubois, MD  dronabinol (MARINOL) 5 MG capsule Take 1 capsule (5 mg total) by mouth daily before lunch. 11/29/17   Gerlene Fee, NP  Lidocaine-Prilocaine, Bulk, 2.5-2.5 % CREA Apply  to affected areas topically one time daily    [provider]  magnesium oxide (MAG-OX) 400 (241.3 Mg) MG tablet Take 1 tablet (400 mg total) by mouth 2 (two) times daily. 11/14/17 12/14/17  Barton Dubois, MD  metoCLOPramide (REGLAN) 10 MG tablet Take 1 tablet (10 mg total) by mouth 3 (three) times daily before meals. 11/14/17   Barton Dubois, MD  metoprolol tartrate (LOPRESSOR) 25 MG tablet Take 0.5 tablets (12.5 mg total) by mouth 2 (two) times daily. 11/14/17   Barton Dubois, MD  Multiple Vitamin (MULTIVITAMIN) LIQD Take 15 mLs by mouth daily. 11/15/17   Barton Dubois, MD  OLANZapine zydis (ZYPREXA) 5 MG disintegrating tablet Take 0.5 tablets (2.5 mg total) by mouth at bedtime. 09/03/17   Theodis Blaze, MD  oxyCODONE (OXYCONTIN) 10 mg 12 hr tablet Take 1 tablet (10 mg total) by mouth every 12 (twelve) hours. 11/30/17   Gerlene Fee, NP  Oxycodone HCl 10 MG TABS Take 1 tablet (10 mg total) by mouth every 6 (six) hours as needed (for pain). 11/29/17   Gerlene Fee, NP  pantoprazole (PROTONIX) 40 MG tablet Take 40 mg by mouth daily with lunch.     [provider]  polyethylene glycol (MIRALAX / GLYCOLAX) packet Take 17 g daily as needed by mouth for moderate constipation.     [provider]  potassium & sodium phosphates (PHOS-NAK) 280-160-250 MG PACK Take 1 packet by mouth 4 (four) times daily -  before meals and at bedtime. 11/14/17   Barton Dubois, MD  predniSONE (DELTASONE) 10 MG tablet Take 2 tablets (20 mg total) daily with breakfast by mouth. 10/08/17   Patrecia Pour, MD  prochlorperazine (COMPAZINE) 10 MG tablet Take 10 mg every 6 (six) hours as needed by mouth for nausea or vomiting.    [provider]  senna-docusate (SENOKOT-S) 8.6-50 MG tablet Take 1 tablet by mouth daily as needed for mild constipation.    [provider]  sulfamethoxazole-trimethoprim (BACTRIM,SEPTRA) 400-80 MG tablet Take 2 tablets by mouth 3 (three) times a week.  11/16/17   Barton Dubois, MD    Family History Family History  Problem Relation Age of Onset  . Cancer Father     Social History Social History   Tobacco Use  . Smoking status: Never Smoker  . Smokeless tobacco: Never Used  Substance Use Topics  . Alcohol use: Yes    Comment: occasionally/socially  . Drug use: No     Allergies   Bee venom   Review of Systems Review of Systems  Gastrointestinal:       Clogged g tube  All other systems reviewed and are negative.    Physical Exam Updated Vital Signs BP 121/80 (BP Location: Right Arm)   Pulse 96   Temp 98 F (36.7 C) (Oral)   Resp 17   SpO2 96%   Physical Exam  Constitutional: He is oriented to person,  place, and time. He appears well-developed and well-nourished.  HENT:  Head: Normocephalic and atraumatic.  Right Ear: External ear normal.  Left Ear: External ear normal.  Nose: Nose normal.  Mouth/Throat: Oropharynx is clear and moist.  Eyes: Conjunctivae and EOM are normal. Pupils are equal, round, and reactive to light.  Neck: Normal range of motion. Neck supple.  Cardiovascular: Normal rate, regular rhythm, normal heart sounds and intact distal pulses.  Pulmonary/Chest: Effort normal and breath sounds normal.  Abdominal: Soft. Bowel sounds are normal.    Musculoskeletal: Normal range of motion.  Neurological: He is alert and oriented to person, place, and time.  Skin: Skin is warm. Capillary refill takes less than 2 seconds.  Nursing note and vitals reviewed.    ED Treatments / Results  Labs (all labs ordered are listed, but only abnormal results are displayed) Labs Reviewed - No data to display  EKG  EKG Interpretation None       Radiology No results found.  Procedures Procedures (including critical care time)  Medications Ordered in ED Medications - No data to display   Initial Impression / Assessment and Plan / ED Course  I have reviewed the triage vital signs and the nursing  notes.  Pertinent labs & imaging results that were available during my care of the patient were reviewed by me and considered in my medical decision making (see chart for details).    Nurse was able to de clog the tube with coke and water.  The tube is flushing well.  Pt is stable for d/c.  Final Clinical Impressions(s) / ED Diagnoses   Final diagnoses:  Feeding tube blocked, initial encounter    ED Discharge Orders    None       Isla Pence, MD 12/13/17 [phone]

## 2017-12-13 NOTE — ED Triage Notes (Signed)
Pt has a clogged feeding tube for two hours, the facility  tried coke and water and was unsuccessful

## 2017-12-13 NOTE — ED Notes (Addendum)
Feeding tube flushing without resistance. Flushed with 25 mL coca cola and 25 mL water.

## 2017-12-17 ENCOUNTER — Encounter: Payer: Self-pay | Admitting: Adult Health

## 2017-12-17 ENCOUNTER — Non-Acute Institutional Stay (SKILLED_NURSING_FACILITY): Payer: Medicaid Other | Admitting: Adult Health

## 2017-12-17 DIAGNOSIS — E878 Other disorders of electrolyte and fluid balance, not elsewhere classified: Secondary | ICD-10-CM

## 2017-12-17 DIAGNOSIS — F4323 Adjustment disorder with mixed anxiety and depressed mood: Secondary | ICD-10-CM | POA: Diagnosis not present

## 2017-12-17 DIAGNOSIS — C7951 Secondary malignant neoplasm of bone: Secondary | ICD-10-CM | POA: Diagnosis not present

## 2017-12-17 DIAGNOSIS — C8338 Diffuse large B-cell lymphoma, lymph nodes of multiple sites: Secondary | ICD-10-CM | POA: Diagnosis not present

## 2017-12-17 NOTE — Progress Notes (Signed)
Location:   [place] Room Number: 131 A Place of Service:  SNF (31)   CODE STATUS: DNR  Allergies  Allergen Reactions  . Bee Venom Swelling    Chief Complaint  Patient presents with  . Medical Management of Chronic Issues    Lymphoma electrolytes imbalance; adjustment disorder; bony mets      HPI:  He is a 57 year old long term resident of this facility being seen for the management of his chronic illnesses: lymphoma; electrolyte disorder; adjustment disorder; bony mets. He is being followed by hospice care. He denies any uncontrolled pain; he continues with J tube feedings to supplement his po intake. He denies any changes in mood. There are no nursing concerns at this time.   Past Medical History:  Diagnosis Date  . Cancer ([place])   . Tendonitis     Past Surgical History:  Procedure Laterality Date  . CYSTOSCOPY W/ URETERAL STENT PLACEMENT Right 08/21/2017   Procedure: CYSTOSCOPY WITH RETROGRADE PYELOGRAM, RIGHT;  Surgeon: Alexis Frock, MD;  Location: [facility];  Service: Urology;  Laterality: Right;  . HERNIA REPAIR  1988   Dr. Vida Rigger  . IR FLUORO GUIDE PORT INSERTION RIGHT  07/26/2017  . IR GASTR TUBE CONVERT GASTR-JEJ PER W/FL MOD SED  11/09/2017  . IR GASTROSTOMY TUBE MOD SED  11/02/2017  . IR NEPHROSTOMY EXCHANGE RIGHT  10/04/2017  . IR NEPHROSTOMY PLACEMENT RIGHT  08/21/2017  . IR US GUIDE VASC ACCESS RIGHT  07/26/2017    Social History   Socioeconomic History  . Marital status: Single    Spouse name: Not on file  . Number of children: 0  . Years of education: Not on file  . Highest education level: Not on file  Social Needs  . Financial resource strain: Not on file  . Food insecurity - worry: Not on file  . Food insecurity - inability: Not on file  . Transportation needs - medical: Not on file  . Transportation needs - non-medical: Not on file  Occupational History  . Not on file  Tobacco Use  . Smoking status: Never Smoker  . Smokeless  tobacco: Never Used  Substance and Sexual Activity  . Alcohol use: Yes    Comment: occasionally/socially  . Drug use: No  . Sexual activity: No  Other Topics Concern  . 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 Never used smokeless tobacco.   No e-cigarettes   Occasional alcohol consumption socially.   Single, no children.   Family History  Problem Relation Age of Onset  . Cancer Father       VITAL SIGNS BP (!) 99/47   Pulse 83   Temp (!) 96.6 F (35.9 C)   Resp 12   Ht 6' 2"  (1.88 m)   Wt 120 lb (54.4 kg)   SpO2 95%   BMI 15.41 kg/m    Outpatient Encounter Medications as of 12/17/2017  Medication Sig  . apixaban (ELIQUIS) 5 MG TABS tablet Take 5 mg by mouth 2 (two) times daily. Starting 11/22/17  . chlorhexidine (PERIDEX) 0.12 % solution 15 mLs by Mouth Rinse route 2 (two) times daily.  . collagenase (SANTYL) ointment Apply topically daily. Applied to wounds in his sacral area and also thoracic spine once a day.  . dronabinol (MARINOL) 5 MG capsule Take 1 capsule (5 mg total) by mouth daily before lunch.  . Lidocaine-Prilocaine, Bulk, 2.5-2.5 %  CREA Apply to affected areas topically one time daily  . metoCLOPramide (REGLAN) 10 MG tablet Take 1 tablet (10 mg total) by mouth 3 (three) times daily before meals.  Marland Kitchen OLANZapine zydis (ZYPREXA) 5 MG disintegrating tablet Take 0.5 tablets (2.5 mg total) by mouth at bedtime.  Marland Kitchen oxyCODONE (OXYCONTIN) 10 mg 12 hr tablet Take 1 tablet (10 mg total) by mouth every 12 (twelve) hours.  . Oxycodone HCl 10 MG TABS Take 1 tablet (10 mg total) by mouth every 6 (six) hours as needed (for pain).  . pantoprazole (PROTONIX) 40 MG tablet Take 40 mg by mouth daily with lunch.   . polyethylene glycol (MIRALAX / GLYCOLAX) packet Take 17 g daily as needed by mouth for moderate constipation.   . predniSONE (DELTASONE) 10 MG tablet Take 2 tablets (20 mg total) daily with breakfast  by mouth.  . prochlorperazine (COMPAZINE) 10 MG tablet Take 10 mg every 6 (six) hours as needed by mouth for nausea or vomiting.  . senna-docusate (SENOKOT-S) 8.6-50 MG tablet Take 1 tablet by mouth daily as needed for mild constipation.  . sulfamethoxazole-trimethoprim (BACTRIM,SEPTRA) 400-80 MG tablet Take 2 tablets by mouth 3 (three) times a week.  . [DISCONTINUED] Amino Acids-Protein Hydrolys (FEEDING SUPPLEMENT, PRO-STAT SUGAR FREE 64,) LIQD Place 30 mLs into feeding tube daily. (Patient not taking: Reported on 12/17/2017)  . [DISCONTINUED] calcium elemental as carbonate (BARIATRIC TUMS ULTRA) 400 MG chewable tablet Chew 3 tablets (1,200 mg total) 3 (three) times daily by mouth. (Patient not taking: Reported on 12/17/2017)  . [DISCONTINUED] Cholecalciferol 50000 units capsule Take 50,000 Units by mouth daily. Every Monday  . [DISCONTINUED] metoprolol tartrate (LOPRESSOR) 25 MG tablet Take 0.5 tablets (12.5 mg total) by mouth 2 (two) times daily. (Patient not taking: Reported on 12/17/2017)  . [DISCONTINUED] Multiple Vitamin (MULTIVITAMIN) LIQD Take 15 mLs by mouth daily. (Patient not taking: Reported on 12/17/2017)  . [DISCONTINUED] potassium & sodium phosphates (PHOS-NAK) 280-160-250 MG PACK Take 1 packet by mouth 4 (four) times daily -  before meals and at bedtime. (Patient not taking: Reported on 12/17/2017)   Facility-Administered Encounter Medications as of 12/17/2017  Medication  . sodium chloride flush (NS) 0.9 % injection 10 mL     SIGNIFICANT DIAGNOSTIC EXAMS  PREVIOUS:   08-21-17:chest x-ray: No active cardiopulmonary disease.  08-21-17: lumbar spine complete x-ray: There is again noted fracture L3 vertebral body. Tumor extends into and involves the left L3 pedicle. This finding was present on prior lumbar MR. No new bone lesions are evident. No new fracture. No spondylolisthesis. There is facet osteoarthritic change at L4-5 L5-S1 bilaterally. There is also loss of the pedicle on the  right at T12, indicative of bony metastatic disease in this area.  08-21-17: thoracic spine x-ray: No fracture or spondylolisthesis. There are multiple metastatic foci Seen in the thoracic spine performed three months prior. By radiography, there is loss of the pedicle of T12 on the right and subtle loss of the lateral aspect of the pedicle of T8 on the left. 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 No other neoplastic foci identified by radiography the bony structures. Note that there are multiple foci of tumor and thoracic vertebral bodies seen on recent MR not seen by radiography currently.   08-22-17: placement of right nephrostomy placement  10-03-17: chest x-ray: No active cardiopulmonary disease.   10-03-17: ct angio of chest:  1. Acute pulmonary embolism within the LEFT lower lobe and lingula. These small emboli are nonocclusive.  Overall clot burden is minimal. 2. No evidence of RIGHT ventricular strain. 3. Patchy airspace disease in the LEFT lower lobe. Favor pulmonary infection such as pneumonia or aspiration rather than pulmonary infarction. 4. Frothy material within the LEFT lower lobe bronchus could represent aspirate from tracheal aspiration.  10-04-17 right nephrostomy stent replacement   10-16-17: renal ultrasound:  No etiology for the bloody nephrostomy tube drainage is observed. The proximal pigtail of the nephrostomy tube appears to lie in the mid renal collecting system. No hydronephrosis on the right or left.  10-19-17: ct of neck:  1. No prior head or neck CT. 2. 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 Associated pathologic fractures of the right C1 lateral mass and the C4 vertebral body. 3. No lymphadenopathy in the neck. 4. There is a 2 cm circumscribed cyst in the posterior right hypopharynx contiguous with the right AE fold which appears to be new since the CTA chest 10/03/2017. Burtis Junes this is a  benign retention cyst. 5. Right mastoid effusion, likely reactive to #2.  10-19-17: ct of chest abdomen and pelvis:  1. Overall improved CT appearance of the patient's lymphoma most significantly involving the osseous structures. No new or progressive bony findings. 2. Significant improvement of the retroperitoneal lymph nodes. No new or progressive findings in the abdomen or pelvis. 3. Right-sided externalized nephrostomy tube with obstructing upper ureteral calculi and associated mild to moderate hydroureteronephrosis. 4. Stable lung findings.  10-19-17: NM bone scan whole body: Multiple bony metastases as described. Focal increased activity overlying the right maxilla may represent dental disease or possibly metastasis.  10-27-17: MRI cervical; thoracic; lumbar spine:  1. Multiple vertebral body, posterior element, rib, sacrum, and left scapula enhancing metastasis. 2. No epidural, leptomeningeal, or cord metastasis. 3. Pathologic mild C4, mild T10, mild T12, and moderate L3 compression deformities. 4. Mild bony retropulsion of L3 vertebral body eccentric to left with mild canal and severe left L3-4 foraminal stenosis. 5. Cystic lesion in right posterior hypopharynx measuring up to 22 mm, direct visualization recommended.  12-09-17: chest x-ray: No acute pulmonary process identified. 12-09-17    12-09-17: lumbar spine x-ray:  1. Chronic L3 logic compression fracture. 2. No evidence of acute fracture. 3. Additional right iliac bone lesions on prior MRI and CT are not well visualized radiographically.  NO NEW EXAMS     LABS REVIEWED: PREVIOUS:   08-21-17: wbc 0.1; hgb 9.8; hct 27.6; mcv 71.7; plt 76; glucose 144 bun 12; creat 0.63; k+ 3.5; na++ 135; ca 6.9; liver normal albumin 3.1; phos 1.9; BNP 607.1; uric acid 3.8 blood culture: enterobacteriaceae; e-coli 08-24-17: wbc 5.3; hgb 8.7; hct 249; mcv 73.7; plt 57; glucose 93; bun 11; creat 0.38; k+ 2.7; na++ 139; ca 6.3; albumin 2.3 08-25-17:  blood culture: no growth  08-27-17: wbc 13.4; hgb 8.7; hct 25.2; mcv 73.5; plt 63; glucose 92; bun 7; creat 0.31; k+ 3.0; na++ 140; ca 6.3; alt 73; albumin 2.3  08-30-17: wbc 6.2; hgb 9.0; hct 26.5; mcv 74.9; plt 120; glucose 109; bun <5' creat <0.30; k+ 3.4; na++ 135; ca 6.2; liver normal albumin 2.3; mag 1.6; phos 1.7 09-03-17: wbc 6.3; hgb 10.0; hct 29.5; mcv 74.1; plt 403; glucose 103; bun 8; creat 0.38; k+ 3.1; na++ 136; mag 1.7; phos 1.9  09-17-17: wbc 10.4; hgb 11.6; hct 35.4; mcv 75.0; plt 453; glucose 108; bun 7.3; creat 0.5; k+ 3.5; na++ 135; ca 6.4; total bili 1.26; albumin 2.4; uric  acid 6.8; mag 1.6; phos 1.9 09-21-17: wbc 30.8; hgb 8.4; hct 26.0; mcv 78.3; plt 252; uric acid 1.9; mag 1.7; phos 1.6 09-24-17: wbc 13.7; hgb 8.0; hct 26.1; mcv 78.4; plt 145; glucose 87; bun <5; creat 0.30; k+ 4.3; na+= 137; ca 6.5; ast 103; alt 76; alk phos 195; albumin 2.2; uric acid 1.6; mag 1.7; phos 2.3; vit D 8.3 10-03-17: wbc 3.4; hgb 9.2; hct 29.9; mcv 80.8; plt 415; glucose 97; bun 10; creat 0.45; k+ 3.0; na++ 139; ca 6.9; indirect bili 1.3; alk phos 158 albumin 2.8; mag 2.0; phos 1.6; blood culture: no growth; urine culture: 50,000 e-coli 10-05-17: wbc 2.9; hgb 7.8; hct 24.6; mcv 80.7 ;plt 329; glucose 92; bun 9; creat 0.36; k+ 3.0; na++ 140; ca 6.4; mag 1.7; phos 1.8; ionized calcium 3.5 10-08-17: wbc 3.2; hgb 9.0; hct 28.6; mcv 81.7; plt 318; glucose 99; bun <5; creat 0.31; k+ 3.5; na++ 140; ca 6.9; liver normal albumin 2.3; mag 1.6; phos 2.4  10-16-17: wbc 9.6; hgb 11.2; hct 35.3; mcv 81.1; plt 378; glucose 90; bun 5; creat 0.38; k+2.7; na++ 138; ca 6.4; total bili 1.5; albumin 2.9; mag 2.2; tsh 2.260 10-23-17: wbc glucose 80; bun <5; creat <0.30; k+ 3.3; na++ 136; ca 5.8 10-26-17: glucose 85; bun <5; creat <0.30; k+3.3; na++ 138; ca 6.6 mag 2.2; phos 2.7; vit D 33.7 11-07-17: wbc 11.8; hgb 8.7; hct 26.8; mcv 84.5, plt 138; glucose 84; bun 12; creat <0.30; k+ 3.6; na++ 136; ca 6.8; albumin 2.0 11-14-17:  wbc 11.0; hgb 9.1; hct 28.5; mcv 84.3; plt 376 glucose 104; bun 13; creat <0.30; k+ 4.0; na++ 138; ca 7.7; liver normal albumin 2.3; mag 2.0; phos 3.7 12-09-17: wbc 17.7; hgb 10.2; hct 32.2; mcv 81.9 ;plt 403; glucose 110; bun 16; creat 0.36; k+ 4.0; na++ 136 ca 8.6     NO NEW LABS    Review of Systems  Constitutional: Negative for malaise/fatigue.  Respiratory: Negative for cough and shortness of breath.   Cardiovascular: Negative for chest pain, palpitations and leg swelling.  Gastrointestinal: Negative for abdominal pain, constipation and heartburn.  Musculoskeletal: Negative for back pain, joint pain and myalgias.  Skin: Negative.   Neurological: Negative for dizziness.  Psychiatric/Behavioral: The patient is not nervous/anxious.     Physical Exam  Constitutional: He is oriented to person, place, and time. No distress.  Frail   Neck: No thyromegaly present.  Cardiovascular: Normal rate, regular rhythm and intact distal pulses.  Murmur heard. 1/6  Pulmonary/Chest: Effort normal and breath sounds normal. No respiratory distress.  Abdominal: Soft. Bowel sounds are normal. He exhibits no distension. There is no tenderness.  J tube present without signs of infection present   Musculoskeletal: He exhibits no edema.  Is able to move all extremities   Lymphadenopathy:    He has no cervical adenopathy.  Neurological: He is alert and oriented to person, place, and time.  Has periods of confusion   Skin: Skin is warm and dry. He is not diaphoretic.  Right chest wall portacath  Right medal back abrasion: 5.0 x 4.2 x 0.1 cm Left lateral back abrasion: 1.0 x 3.5 x 0.1 cm Right posterior ankle: stage III: 0.6 x 0.5 x NM cm  Left back stage IV: 9.4 x 3.7 x 0.5 cm Sacrum stage IV: 4.4 x 3.1 x 0.7 cm       ASSESSMENT/ PLAN:   TODAY:  1. 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 Was diagnosed  in July 2018. He has had a chemo of reduced [name] called mini-CHOP  (october 2018) he did receive intrathecal methotrexate. The goal is to increase the dose back to normal levels. His weight is 120 pounds. For his pain management is taking oxycontin 10 mg twice daily and oxycodone 10 mg every 6 hours as needed will monitor   Will stop marinol as this medication is not effective   2. GERD: stable will continue protonix 40 mg daily   3. Electrolyte imbalance: including k+; mag; ca++; phosphate:  His supplements were stopped as he is now being followed by hospice care.    4.  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 Closed compression fracture of L3 lumbar vertebra: without change will continue therapy as directed has oxycodone 10 mg every 6 hours as needed is taking oxycontin 10 mg twice daily   6. Obstructing right ureteral stone: stable has no nephrostomy tube at this time   7. Chemo induced nausea and vomiting: without change will continue  zofran 8 mg every 8 hours as needed  Is taking reglan 10 mg three times daily before meals.   8. Adrenal insuffiencey: stable will continue prednisone 20 mg daily is on bractrim 3 days weekly long term.   9. Acute pulmonary embolism: stable will continue eliquis 5 mg twice daily   10. Protein calorie malnutrition: has required placement of J tube for his nutritional support will monitor   MD is aware of resident's narcotic use and is in agreement with current plan of care. We will attempt to wean resident as apropriate   Ok Edwards NP [facility]  Contact [phone] Monday through Friday 8am- 5pm  After hours call [phone]

## 2017-12-19 ENCOUNTER — Other Ambulatory Visit (HOSPITAL_COMMUNITY): Payer: Self-pay | Admitting: Interventional Radiology

## 2017-12-19 ENCOUNTER — Ambulatory Visit (HOSPITAL_COMMUNITY)
Admission: RE | Admit: 2017-12-19 | Discharge: 2017-12-19 | Disposition: A | Discharge status: Home / Self Care | Payer: Medicaid Other | Source: Ambulatory Visit | Attending: Interventional Radiology | Admitting: Interventional Radiology

## 2017-12-19 DIAGNOSIS — T85598A Other mechanical complication of other gastrointestinal prosthetic devices, implants and grafts, initial encounter: Secondary | ICD-10-CM

## 2017-12-19 DIAGNOSIS — K9423 Gastrostomy malfunction: Secondary | ICD-10-CM | POA: Diagnosis not present

## 2017-12-19 HISTORY — PX: IR REPLC DUODEN/JEJUNO TUBE PERCUT W/FLUORO: IMG2334

## 2017-12-19 MED ORDER — IOPAMIDOL (ISOVUE-300) INJECTION 61%
INTRAVENOUS | Status: AC
  Administered 2017-12-19: 20 mL
  Filled 2017-12-19: qty 50

## 2017-12-20 ENCOUNTER — Encounter (HOSPITAL_COMMUNITY): Payer: Self-pay | Admitting: Interventional Radiology

## 2017-12-23 DIAGNOSIS — Z934 Other artificial openings of gastrointestinal tract status: Secondary | ICD-10-CM | POA: Insufficient documentation

## 2017-12-24 ENCOUNTER — Non-Acute Institutional Stay (SKILLED_NURSING_FACILITY): Payer: Medicaid Other | Admitting: Adult Health

## 2017-12-24 ENCOUNTER — Encounter: Payer: Self-pay | Admitting: Adult Health

## 2017-12-24 DIAGNOSIS — C7951 Secondary malignant neoplasm of bone: Secondary | ICD-10-CM

## 2017-12-24 DIAGNOSIS — C8338 Diffuse large B-cell lymphoma, lymph nodes of multiple sites: Secondary | ICD-10-CM

## 2017-12-24 DIAGNOSIS — I2699 Other pulmonary embolism without acute cor pulmonale: Secondary | ICD-10-CM | POA: Diagnosis not present

## 2017-12-24 NOTE — Progress Notes (Signed)
Location:   [facility] Room Number: 131 A Place of Service:  SNF (31)   CODE STATUS: DNR  Allergies  Allergen Reactions  . Bee Venom Swelling    Chief Complaint  Patient presents with  . Acute Visit    Med Review    HPI:  The hospice team is wanting to review his medications to help reduce pill load and to ensure that his medications are within the formulary. He will no longer need the eliquis; asa should be sufficient as the focus of his care is comfort only; the risks no longer outweigh the benefits. Will need to stop the peridex. The oxycontin is not covered and will need to be changed to MS contin. He tells me that he is feeling ok. He denies any uncontrolled pain; he does have fatigue and weakness present; no change in appetite.    Past Medical History:  Diagnosis Date  . Cancer ([place])   . Tendonitis     Past Surgical History:  Procedure Laterality Date  . CYSTOSCOPY W/ URETERAL STENT PLACEMENT Right 08/21/2017   Procedure: CYSTOSCOPY WITH RETROGRADE PYELOGRAM, RIGHT;  Surgeon: Alexis Frock, MD;  Location: [facility];  Service: Urology;  Laterality: Right;  . HERNIA REPAIR  1988   Dr. Vida Rigger  . IR FLUORO GUIDE PORT INSERTION RIGHT  07/26/2017  . IR GASTR TUBE CONVERT GASTR-JEJ PER W/FL MOD SED  11/09/2017  . IR GASTROSTOMY TUBE MOD SED  11/02/2017  . IR NEPHROSTOMY EXCHANGE RIGHT  10/04/2017  . IR NEPHROSTOMY PLACEMENT RIGHT  08/21/2017  . IR REPLC DUODEN/JEJUNO TUBE PERCUT W/FLUORO  12/19/2017  . IR US GUIDE VASC ACCESS RIGHT  07/26/2017    Social History   Socioeconomic History  . Marital status: Single    Spouse name: Not on file  . Number of children: 0  . Years of education: Not on file  . Highest education level: Not on file  Social Needs  . Financial resource strain: Not on file  . Food insecurity - worry: Not on file  . Food insecurity - inability: Not on file  . Transportation needs - medical: Not on file  . Transportation needs -  non-medical: Not on file  Occupational History  . Not on file  Tobacco Use  . Smoking status: Never Smoker  . Smokeless tobacco: Never Used  Substance and Sexual Activity  . Alcohol use: Yes    Comment: occasionally/socially  . Drug use: No  . Sexual activity: No  Other Topics Concern  . 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 Never used smokeless tobacco.   No e-cigarettes   Occasional alcohol consumption socially.   Single, no children.   Family History  Problem Relation Age of Onset  . Cancer Father       VITAL SIGNS BP (!) 99/47   Pulse 83   Temp (!) 96.6 F (35.9 C)   Resp 12   Ht 6' 2"  (1.88 m)   Wt 120 lb (54.4 kg)   SpO2 95%   BMI 15.41 kg/m   Outpatient Encounter Medications as of 12/24/2017  Medication Sig  . apixaban (ELIQUIS) 5 MG TABS tablet Take 5 mg by mouth 2 (two) times daily. Starting 11/22/17  . chlorhexidine (PERIDEX) 0.12 % solution 15 mLs by Mouth Rinse route 2 (two) times daily.  . Lidocaine-Prilocaine, Bulk, 2.5-2.5 % CREA Apply to affected areas topically one time daily  .  metoCLOPramide (REGLAN) 10 MG tablet Take 1 tablet (10 mg total) by mouth 3 (three) times daily before meals.  Marland Kitchen OLANZapine zydis (ZYPREXA) 5 MG disintegrating tablet Take 0.5 tablets (2.5 mg total) by mouth at bedtime.  Marland Kitchen oxyCODONE (OXYCONTIN) 10 mg 12 hr tablet Take 1 tablet (10 mg total) by mouth every 12 (twelve) hours.  . Oxycodone HCl 10 MG TABS Take 1 tablet (10 mg total) by mouth every 6 (six) hours as needed (for pain).  . pantoprazole (PROTONIX) 40 MG tablet Take 40 mg by mouth daily with lunch.   . polyethylene glycol (MIRALAX / GLYCOLAX) packet Take 17 g daily as needed by mouth for moderate constipation.   . predniSONE (DELTASONE) 10 MG tablet Take 2 tablets (20 mg total) daily with breakfast by mouth.  . senna-docusate (SENOKOT-S) 8.6-50 MG tablet Take 1 tablet by mouth daily as needed for  mild constipation.  . sulfamethoxazole-trimethoprim (BACTRIM,SEPTRA) 400-80 MG tablet Take 2 tablets by mouth 3 (three) times a week.  . [DISCONTINUED] collagenase (SANTYL) ointment Apply topically daily. Applied to wounds in his sacral area and also thoracic spine once a day. (Patient not taking: Reported on 12/24/2017)  . [DISCONTINUED] dronabinol (MARINOL) 5 MG capsule Take 1 capsule (5 mg total) by mouth daily before lunch. (Patient not taking: Reported on 12/24/2017)  . [DISCONTINUED] prochlorperazine (COMPAZINE) 10 MG tablet Take 10 mg every 6 (six) hours as needed by mouth for nausea or vomiting.   Facility-Administered Encounter Medications as of 12/24/2017  Medication  . sodium chloride flush (NS) 0.9 % injection 10 mL     SIGNIFICANT DIAGNOSTIC EXAMS   PREVIOUS:   08-21-17:chest x-ray: No active cardiopulmonary disease.  08-21-17: lumbar spine complete x-ray: There is again noted fracture L3 vertebral body. Tumor extends into and involves the left L3 pedicle. This finding was present on prior lumbar MR. No new bone lesions are evident. No new fracture. No spondylolisthesis. There is facet osteoarthritic change at L4-5 L5-S1 bilaterally. There is also loss of the pedicle on the right at T12, indicative of bony metastatic disease in this area.  08-21-17: thoracic spine x-ray: No fracture or spondylolisthesis. There are multiple metastatic foci Seen in the thoracic spine performed three months prior. By radiography, there is loss of the pedicle of T12 on the right and subtle loss of the lateral aspect of the pedicle of T8 on the left. 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 No other neoplastic foci identified by radiography the bony structures. Note that there are multiple foci of tumor and thoracic vertebral bodies seen on recent MR not seen by radiography currently.   08-22-17: placement of right nephrostomy placement  10-03-17: chest x-ray: No active  cardiopulmonary disease.   10-03-17: ct angio of chest:  1. Acute pulmonary embolism within the LEFT lower lobe and lingula. These small emboli are nonocclusive. Overall clot burden is minimal. 2. No evidence of RIGHT ventricular strain. 3. Patchy airspace disease in the LEFT lower lobe. Favor pulmonary infection such as pneumonia or aspiration rather than pulmonary infarction. 4. Frothy material within the LEFT lower lobe bronchus could represent aspirate from tracheal aspiration.  10-04-17 right nephrostomy stent replacement   10-16-17: renal ultrasound:  No etiology for the bloody nephrostomy tube drainage is observed. The proximal pigtail of the nephrostomy tube appears to lie in the mid renal collecting system. No hydronephrosis on the right or left.  10-19-17: ct of neck:  1. No prior head or neck CT. 2.  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 Associated pathologic fractures of the right C1 lateral mass and the C4 vertebral body. 3. No lymphadenopathy in the neck. 4. There is a 2 cm circumscribed cyst in the posterior right hypopharynx contiguous with the right AE fold which appears to be new since the CTA chest 10/03/2017. Burtis Junes this is a benign retention cyst. 5. Right mastoid effusion, likely reactive to #2.  10-19-17: ct of chest abdomen and pelvis:  1. Overall improved CT appearance of the patient's lymphoma most significantly involving the osseous structures. No new or progressive bony findings. 2. Significant improvement of the retroperitoneal lymph nodes. No new or progressive findings in the abdomen or pelvis. 3. Right-sided externalized nephrostomy tube with obstructing upper ureteral calculi and associated mild to moderate hydroureteronephrosis. 4. Stable lung findings.  10-19-17: NM bone scan whole body: Multiple bony metastases as described. Focal increased activity overlying the right maxilla may represent dental disease or  possibly metastasis.  10-27-17: MRI cervical; thoracic; lumbar spine:  1. Multiple vertebral body, posterior element, rib, sacrum, and left scapula enhancing metastasis. 2. No epidural, leptomeningeal, or cord metastasis. 3. Pathologic mild C4, mild T10, mild T12, and moderate L3 compression deformities. 4. Mild bony retropulsion of L3 vertebral body eccentric to left with mild canal and severe left L3-4 foraminal stenosis. 5. Cystic lesion in right posterior hypopharynx measuring up to 22 mm, direct visualization recommended.  12-09-17: chest x-ray: No acute pulmonary process identified. 12-09-17    12-09-17: lumbar spine x-ray:  1. Chronic L3 logic compression fracture. 2. No evidence of acute fracture. 3. Additional right iliac bone lesions on prior MRI and CT are not well visualized radiographically.  NO NEW EXAMS     LABS REVIEWED: PREVIOUS:   08-21-17: wbc 0.1; hgb 9.8; hct 27.6; mcv 71.7; plt 76; glucose 144 bun 12; creat 0.63; k+ 3.5; na++ 135; ca 6.9; liver normal albumin 3.1; phos 1.9; BNP 607.1; uric acid 3.8 blood culture: enterobacteriaceae; e-coli 08-24-17: wbc 5.3; hgb 8.7; hct 249; mcv 73.7; plt 57; glucose 93; bun 11; creat 0.38; k+ 2.7; na++ 139; ca 6.3; albumin 2.3 08-25-17: blood culture: no growth  08-27-17: wbc 13.4; hgb 8.7; hct 25.2; mcv 73.5; plt 63; glucose 92; bun 7; creat 0.31; k+ 3.0; na++ 140; ca 6.3; alt 73; albumin 2.3  08-30-17: wbc 6.2; hgb 9.0; hct 26.5; mcv 74.9; plt 120; glucose 109; bun <5' creat <0.30; k+ 3.4; na++ 135; ca 6.2; liver normal albumin 2.3; mag 1.6; phos 1.7 09-03-17: wbc 6.3; hgb 10.0; hct 29.5; mcv 74.1; plt 403; glucose 103; bun 8; creat 0.38; k+ 3.1; na++ 136; mag 1.7; phos 1.9  09-17-17: wbc 10.4; hgb 11.6; hct 35.4; mcv 75.0; plt 453; glucose 108; bun 7.3; creat 0.5; k+ 3.5; na++ 135; ca 6.4; total bili 1.26; albumin 2.4; uric acid 6.8; mag 1.6; phos 1.9 09-21-17: wbc 30.8; hgb 8.4; hct 26.0; mcv 78.3; plt 252; uric acid 1.9; mag 1.7; phos  1.6 09-24-17: wbc 13.7; hgb 8.0; hct 26.1; mcv 78.4; plt 145; glucose 87; bun <5; creat 0.30; k+ 4.3; na+= 137; ca 6.5; ast 103; alt 76; alk phos 195; albumin 2.2; uric acid 1.6; mag 1.7; phos 2.3; vit D 8.3 10-03-17: wbc 3.4; hgb 9.2; hct 29.9; mcv 80.8; plt 415; glucose 97; bun 10; creat 0.45; k+ 3.0; na++ 139; ca 6.9; indirect bili 1.3; alk phos 158 albumin 2.8; mag 2.0; phos 1.6; blood culture: no growth; urine culture:  50,000 e-coli 10-05-17: wbc 2.9; hgb 7.8; hct 24.6; mcv 80.7 ;plt 329; glucose 92; bun 9; creat 0.36; k+ 3.0; na++ 140; ca 6.4; mag 1.7; phos 1.8; ionized calcium 3.5 10-08-17: wbc 3.2; hgb 9.0; hct 28.6; mcv 81.7; plt 318; glucose 99; bun <5; creat 0.31; k+ 3.5; na++ 140; ca 6.9; liver normal albumin 2.3; mag 1.6; phos 2.4  10-16-17: wbc 9.6; hgb 11.2; hct 35.3; mcv 81.1; plt 378; glucose 90; bun 5; creat 0.38; k+2.7; na++ 138; ca 6.4; total bili 1.5; albumin 2.9; mag 2.2; tsh 2.260 10-23-17: wbc glucose 80; bun <5; creat <0.30; k+ 3.3; na++ 136; ca 5.8 10-26-17: glucose 85; bun <5; creat <0.30; k+3.3; na++ 138; ca 6.6 mag 2.2; phos 2.7; vit D 33.7 11-07-17: wbc 11.8; hgb 8.7; hct 26.8; mcv 84.5, plt 138; glucose 84; bun 12; creat <0.30; k+ 3.6; na++ 136; ca 6.8; albumin 2.0 11-14-17: wbc 11.0; hgb 9.1; hct 28.5; mcv 84.3; plt 376 glucose 104; bun 13; creat <0.30; k+ 4.0; na++ 138; ca 7.7; liver normal albumin 2.3; mag 2.0; phos 3.7 12-09-17: wbc 17.7; hgb 10.2; hct 32.2; mcv 81.9 ;plt 403; glucose 110; bun 16; creat 0.36; k+ 4.0; na++ 136 ca 8.6     NO NEW LABS    Review of Systems  Constitutional: Negative for malaise/fatigue.  Respiratory: Negative for cough and shortness of breath.   Cardiovascular: Negative for chest pain, palpitations and leg swelling.  Gastrointestinal: Negative for abdominal pain, constipation and heartburn.  Musculoskeletal: Negative for back pain, joint pain and myalgias.  Skin: Negative.   Neurological: Negative for dizziness.    Psychiatric/Behavioral: The patient is not nervous/anxious.     Physical Exam  Constitutional: He is oriented to person, place, and time. No distress.  Frail   Neck: No thyromegaly present.  Cardiovascular: Normal rate, regular rhythm and intact distal pulses.  Murmur heard. 1/6  Pulmonary/Chest: Effort normal and breath sounds normal. No respiratory distress.  Abdominal: Soft. Bowel sounds are normal. He exhibits no distension. There is no tenderness.  J tube present without signs of infection present   Musculoskeletal: He exhibits no edema.  Is able to move all extremities   Lymphadenopathy:    He has no cervical adenopathy.  Neurological: He is alert and oriented to person, place, and time.  Skin: Skin is warm and dry. He is not diaphoretic.  Stage IV right sacrum: 5.5 x 3.2 x 0.4 cm Stage IV: left back: 4.8 x 4.8 x 0.7 cm Stage III right posterior ankle: 0.2 x 0.2 x NM Left shin: 1.4 x 1.5 x NM cm  Right chest wall portacath  Psychiatric: He has a normal mood and affect.    ASSESSMENT/ PLAN:  TODAY:   1. 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 Bony metastasis 3. 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    MD is aware of resident's narcotic use and is in agreement with current plan of care. We will attempt to wean resident as apropriate   Ok Edwards NP [facility]  Contact [phone] Monday through Friday 8am- 5pm  After hours call [phone]

## 2017-12-25 ENCOUNTER — Other Ambulatory Visit: Payer: Self-pay | Admitting: Hematology and Oncology

## 2017-12-25 DIAGNOSIS — C8338 Diffuse large B-cell lymphoma, lymph nodes of multiple sites: Secondary | ICD-10-CM

## 2017-12-27 ENCOUNTER — Telehealth: Payer: Self-pay | Admitting: Hematology and Oncology

## 2017-12-27 NOTE — Telephone Encounter (Signed)
Scheduled appt per 12/2 sch message - left message with appt date and time and sent reminder letter in the mail.  

## 2018-01-01 ENCOUNTER — Encounter (HOSPITAL_COMMUNITY): Payer: Self-pay | Admitting: Interventional Radiology

## 2018-01-10 ENCOUNTER — Ambulatory Visit: Payer: Self-pay | Admitting: Hematology and Oncology

## 2018-01-10 ENCOUNTER — Telehealth: Payer: Self-pay | Admitting: Hematology and Oncology

## 2018-01-10 NOTE — Telephone Encounter (Signed)
Called regarding voicemail °

## 2018-01-14 ENCOUNTER — Ambulatory Visit: Payer: Self-pay | Admitting: Hematology and Oncology

## 2018-01-14 ENCOUNTER — Encounter: Payer: Self-pay | Admitting: Internal Medicine

## 2018-01-14 ENCOUNTER — Non-Acute Institutional Stay (SKILLED_NURSING_FACILITY): Payer: Medicaid Other | Admitting: Internal Medicine

## 2018-01-14 DIAGNOSIS — E43 Unspecified severe protein-calorie malnutrition: Secondary | ICD-10-CM

## 2018-01-14 DIAGNOSIS — C7951 Secondary malignant neoplasm of bone: Secondary | ICD-10-CM | POA: Diagnosis not present

## 2018-01-14 DIAGNOSIS — Z934 Other artificial openings of gastrointestinal tract status: Secondary | ICD-10-CM | POA: Diagnosis not present

## 2018-01-14 DIAGNOSIS — R627 Adult failure to thrive: Secondary | ICD-10-CM

## 2018-01-14 DIAGNOSIS — L8995 Pressure ulcer of unspecified site, unstageable: Secondary | ICD-10-CM

## 2018-01-14 DIAGNOSIS — I2699 Other pulmonary embolism without acute cor pulmonale: Secondary | ICD-10-CM

## 2018-01-14 DIAGNOSIS — C8338 Diffuse large B-cell lymphoma, lymph nodes of multiple sites: Secondary | ICD-10-CM | POA: Diagnosis not present

## 2018-01-14 NOTE — Progress Notes (Signed)
Patient ID: Cory Ibarra, male   DOB: [birth year], 59 y.o.   MRN: [id number]  Location:  [facility]   Place of Service:  SNF (31) Provider:  DR Michala Deblanc Hulda Marin, [name]  Patient Care Team: [name], [name] as PCP - General [facility])  Extended Emergency Contact Information Primary Emergency Contact: Routh,Michael Address: [address] [place], [place] [address] [name] of [place] Phone: [phone] Relation: Brother Secondary Emergency Contact: Constance Haw, [place] [address] [name] of [place] Phone: [phone] Work Phone: [phone] Mobile Phone: [phone] Relation: Brother  Code Status:  DNR Goals of care: Advanced Directive information Advanced Directives 12/24/2017  Does Patient Have a Medical Advance Directive? Yes  Type of Paramedic of [place];Out of facility DNR (pink MOST or yellow form)  Does patient want to make changes to medical advance directive? No - Patient declined  Copy of [place] in Chart? Yes  Would patient like information on creating a medical advance directive? No - Patient declined  Pre-existing out of facility DNR order (yellow form or pink MOST form) Yellow form placed in chart (order not valid for inpatient use);Pink MOST form placed in chart (order not valid for inpatient use)     Chief Complaint  Patient presents with  . Medical Management of Chronic Issues    hospice    HPI:  Pt is a 57 y.o. male long term resident on hospice seen today for medical management of chronic diseases.  He reports pain is well controlled and 4/10 on pain scale. Wounds are followed by wound care service. No f/c. Nutrition via TF and appetite remains poor. No N/V. No abdominal pain. Weight down approx 20lbs since Dec 2018.  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 He was diagnosed in July 2018. He rec'd  reduced dose chemotx R (mini-CHOP)  In Oct 2018 as well as intrathecal methotrexate. The goal is to increase the dose back to normal levels. Weight steady at 120 lbs. Pain mx with.oxycontin 10 mg twice daily and oxycodone 10 mg every 6 hours as needed. Off marinol as it was ineffective   GERD - stable on protonix 40 mg daily   Electrolyte imbalance - all supplements stopped to reduce pill burden as he is now on hospice.    Adjustment disorder with mixed anxiety and depressed mood - stable on zyprexa 2.5 mg nightly  Closed compression fracture of L3 lumbar vertebra - controlled on oxycodone 10 mg every 6 hours as needed; oxycontin 10 mg twice daily   Obstructing right ureteral stone - stable without nephrostomy tube  Chemotx induced N/V - unchanged. Takes  zofran 8 mg every 8 hours as needed; reglan 10 mg three times daily before meals.   Adrenal insuffiencey - stable on prednisone 20 mg daily; bractrim 3 days weekly long term. cute pulmonary embolism: stable will continue eliquis 5 mg twice daily 10. Protein calorie malnutrition: has required placement of J tube for his nutritional support will monitor     Past Medical History:  Diagnosis Date  . Cancer ([place])   . Tendonitis    Past Surgical History:  Procedure Laterality Date  . CYSTOSCOPY W/ URETERAL STENT PLACEMENT Right 08/21/2017   Procedure: CYSTOSCOPY WITH RETROGRADE PYELOGRAM, RIGHT;  Surgeon: Alexis Frock, MD;  Location: [facility];  Service: Urology;  Laterality: Right;  . HERNIA REPAIR  1988   Dr. Vida Rigger  . IR FLUORO GUIDE PORT INSERTION RIGHT  07/26/2017  . IR GASTR TUBE CONVERT GASTR-JEJ PER W/FL MOD SED  11/09/2017  . IR GASTROSTOMY TUBE MOD SED  11/02/2017  . IR NEPHROSTOMY EXCHANGE RIGHT  10/04/2017  . IR NEPHROSTOMY PLACEMENT RIGHT  08/21/2017  . IR REPLC DUODEN/JEJUNO TUBE PERCUT W/FLUORO  12/19/2017  . IR US GUIDE VASC ACCESS RIGHT  07/26/2017    Allergies  Allergen Reactions  . Bee Venom Swelling    Outpatient  Encounter Medications as of 01/14/2018  Medication Sig  . apixaban (ELIQUIS) 5 MG TABS tablet Take 5 mg by mouth 2 (two) times daily. Starting 11/22/17  . chlorhexidine (PERIDEX) 0.12 % solution 15 mLs by Mouth Rinse route 2 (two) times daily.  . Lidocaine-Prilocaine, Bulk, 2.5-2.5 % CREA Apply to affected areas topically one time daily  . metoCLOPramide (REGLAN) 10 MG tablet Take 1 tablet (10 mg total) by mouth 3 (three) times daily before meals.  Marland Kitchen OLANZapine zydis (ZYPREXA) 5 MG disintegrating tablet Take 0.5 tablets (2.5 mg total) by mouth at bedtime.  Marland Kitchen oxyCODONE (OXYCONTIN) 10 mg 12 hr tablet Take 1 tablet (10 mg total) by mouth every 12 (twelve) hours.  . Oxycodone HCl 10 MG TABS Take 1 tablet (10 mg total) by mouth every 6 (six) hours as needed (for pain).  . pantoprazole (PROTONIX) 40 MG tablet Take 40 mg by mouth daily with lunch.   . polyethylene glycol (MIRALAX / GLYCOLAX) packet Take 17 g daily as needed by mouth for moderate constipation.   . predniSONE (DELTASONE) 10 MG tablet Take 2 tablets (20 mg total) daily with breakfast by mouth.  . senna-docusate (SENOKOT-S) 8.6-50 MG tablet Take 1 tablet by mouth daily as needed for mild constipation.  . sulfamethoxazole-trimethoprim (BACTRIM,SEPTRA) 400-80 MG tablet Take 2 tablets by mouth 3 (three) times a week.   Facility-Administered Encounter Medications as of 01/14/2018  Medication  . sodium chloride flush (NS) 0.9 % injection 10 mL    Review of Systems  Constitutional: Positive for appetite change (poor) and unexpected weight change (loss).  Musculoskeletal: Positive for arthralgias.  Skin: Positive for wound.  Neurological: Positive for weakness.  All other systems reviewed and are negative.    There is no immunization history on file for this patient. Pertinent  Health Maintenance Due  Topic Date Due  . COLONOSCOPY  09/04/2018 (Originally 03/18/2009)  . INFLUENZA VACCINE  Discontinued   Fall Risk  07/06/2017  Falls in  the past year? No   Functional Status Survey:    Vitals:   01/14/18 1300  BP: (!) 99/47  Pulse: 83  Temp: (!) 96.6 F (35.9 C)  SpO2: 95%  Weight: 120 lb (54.4 kg)   Body mass index is 15.41 kg/m. Physical Exam  Constitutional: He is oriented to person, place, and time. He appears well-developed. He appears cachectic.  Frail appearing in NAD, sitting up in bed  HENT:  Mouth/Throat: Oropharynx is clear and moist.  MM dry; no oral thrush  Eyes: Pupils are equal, round, and reactive to light. No scleral icterus.  Neck: Neck supple. Carotid bruit is not present. No thyromegaly present.  Cardiovascular: Normal rate, regular rhythm and intact distal pulses. Exam reveals no gallop and no friction rub.  Murmur (1/6 SEM) heard. No distal LE edema. No calf TTP  Pulmonary/Chest: Effort normal and breath sounds normal. He has no wheezes. He has no rales. He exhibits no  tenderness.  Abdominal: Soft. Normal appearance and bowel sounds are normal. He exhibits no distension, no abdominal bruit, no pulsatile midline mass and no mass. There is no hepatomegaly. There is no tenderness. There is no rigidity, no rebound and no guarding. No hernia.  Musculoskeletal: He exhibits edema.  Lymphadenopathy:    He has no cervical adenopathy.  Neurological: He is alert and oriented to person, place, and time.  Skin: Skin is warm and dry. No rash noted.  Multiple pressure sores on back and buttock; followed by facility wound care. No signs of secondary infection  Psychiatric: He has a normal mood and affect. His behavior is normal. Judgment and thought content normal.    Labs reviewed: Recent Labs    11/12/17 0335 11/13/17 0418 11/14/17 0332 12/09/17 0145  NA 137 139 134* 136  K 3.6 3.9 4.0 4.0  CL 103 104 100* 101  CO2 29 29 28 24   GLUCOSE 99 113* 104* 110*  BUN 8 12 13 16   CREATININE <0.30* <0.30* <0.30* 0.36*  CALCIUM 7.7* 8.1* 7.7* 8.6*  MG 2.1 2.1 2.0  --   PHOS 2.9 3.7 3.7  --     Recent Labs    11/11/17 0442 11/12/17 0335 11/13/17 0418 11/14/17 0332  AST 22  --  16 21  ALT 23  --  20 21  ALKPHOS 102  --  93 96  BILITOT 0.6  --  0.6 0.5  PROT 4.7*  --  4.9* 4.8*  ALBUMIN 2.2* 2.3* 2.3* 2.3*   Recent Labs    11/02/17 0358  11/10/17 0335  11/13/17 0418 11/14/17 0332 12/09/17 0145  WBC 5.1   < > 4.3   < > 5.8 11.0* 17.7*  NEUTROABS 4.0  --  3.5  --   --   --  16.1*  HGB 9.6*   < > 8.5*   < > 8.9* 9.1* 10.2*  HCT 30.1*   < > 26.6*   < > 28.0* 28.5* 32.2*  MCV 84.8   < > 84.7   < > 84.3 84.3 81.9  PLT 257   < > 150   < > 297 376 403*   < > = values in this interval not displayed.   Lab Results  Component Value Date   TSH 2.260 10/16/2017   No results found for: HGBA1C No results found for: CHOL, HDL, LDLCALC, LDLDIRECT, TRIG, CHOLHDL  Significant Diagnostic Results in last 30 days:  Ir Replc Duoden/jejuno Tube Percut W/fluoro  Addendum Date: 01/01/2018   ADDENDUM REPORT: 01/01/2018 08:53 ADDENDUM: For clarification purposes, only the inner coaxial 9 French gastrojejunostomy catheter was exchanged while the outer disc retention gastrostomy tube was left in place. Electronically Signed   By: Sandi Mariscal M.D.   On: 01/01/2018 08:53   Result Date: 01/01/2018 INDICATION: History of chronic gastrojejunostomy catheter, now with clogged jejunostomy lumen. Please perform fluoroscopic guided exchange. EXAM: FLUOROSCOPIC GUIDED REPLACEMENT OF GASTROJEJUNOSTOMY TUBE COMPARISON:  Fluoroscopic guided conversion of existing gastrostomy to a gastrojejunostomy tube performed 11/09/2017 MEDICATIONS: None. CONTRAST:  20 cc Isovue-300, administered into the enteric system FLUOROSCOPY TIME:  48 seconds (1 mGy) COMPLICATIONS: None. PROCEDURE: Informed written consent was obtained from the patient after a discussion of the risks and benefits. The upper abdomen and the external portion of the existing gastrojejunostomy tube was prepped and draped in the usual sterile fashion,  and a sterile drape was applied covering the operative field. Maximum barrier sterile technique with sterile gowns and gloves were used  for the procedure. A timeout was performed prior to the initiation of the procedure. The jejunal lumen of the coaxial 9-French gastrojejunostomy catheter was injected with a small amount of contrast demonstrated appropriate positioning and functionality. The jejunal limb in was cannulated with a stiff Glidewire advanced into the proximal small bowel. Under intermittent fluoroscopic guidance, the old coaxial gastrojejunostomy catheter was exchanged for a new 9-French coaxial gastrojejunostomy catheter was advanced through the pull-through gastrostomy tube with tip ultimately terminating within the proximal small bowel. Contrast injection via the jejunostomy and gastric lumens confirmed appropriate functioning and positioning. A dressing was placed. The patient tolerated procedure well without immediate postprocedural complication. IMPRESSION: Successful fluoroscopic guided replacement of occluded coaxial 9-French gastrojejunostomy catheter via the preexisting bumper retention gastrostomy tube. The tip of the jejunostomy lumen lies within the proximal jejunum. Both lumens ready for immediate use. Electronically Signed: By: Sandi Mariscal M.D. On: 12/20/2017 08:36    Assessment/Plan   ICD-10-CM   1. FTT (failure to thrive) in adult R62.7   2. Diffuse large B-cell lymphoma of lymph nodes of multiple regions (HCC) C83.38   3. Bony metastasis ([place]) C79.51   4. Jejunostomy tube in situ ([place]) Z93.4   5. Other pulmonary embolism without acute cor pulmonale, unspecified chronicity (HCC) I26.99   6. Pressure injury, unstageable, unspecified location ([place]) L89.95   7. Severe protein-calorie malnutrition ([place]) E43      Cont current meds as ordered  Cont TF as ordered. Peg tube care as indicated  Wound care as ordered  Hospice to follow. Prognosis remains poor. Pain  mananged  Will follow  Labs/tests ordered: none  Lenni Reckner S. Perlie Gold  [facility] [address] [place], [place] [address] [phone] Cell (Monday-Friday 8 AM - 5 PM) [phone] After 5 PM and follow prompts

## 2018-01-19 ENCOUNTER — Emergency Department (HOSPITAL_COMMUNITY)
Admission: EM | Admit: 2018-01-19 | Discharge: 2018-01-19 | Disposition: A | Discharge status: Home / Self Care | Payer: Medicaid Other | Attending: Emergency Medicine | Admitting: Emergency Medicine

## 2018-01-19 ENCOUNTER — Other Ambulatory Visit: Payer: Self-pay

## 2018-01-19 DIAGNOSIS — Y69 Unspecified misadventure during surgical and medical care: Secondary | ICD-10-CM | POA: Diagnosis not present

## 2018-01-19 DIAGNOSIS — K9420 Gastrostomy complication, unspecified: Secondary | ICD-10-CM

## 2018-01-19 DIAGNOSIS — Z7901 Long term (current) use of anticoagulants: Secondary | ICD-10-CM | POA: Insufficient documentation

## 2018-01-19 DIAGNOSIS — Z79899 Other long term (current) drug therapy: Secondary | ICD-10-CM | POA: Diagnosis not present

## 2018-01-19 DIAGNOSIS — Z859 Personal history of malignant neoplasm, unspecified: Secondary | ICD-10-CM | POA: Diagnosis not present

## 2018-01-19 DIAGNOSIS — K9423 Gastrostomy malfunction: Secondary | ICD-10-CM | POA: Insufficient documentation

## 2018-01-19 MED ORDER — PANCRELIPASE (LIP-PROT-AMYL) 12000-38000 UNITS PO CPEP
12000.0000 [IU] | ORAL_CAPSULE | Freq: Once | ORAL | Status: AC
  Administered 2018-01-19: 12000 [IU] via ORAL
  Filled 2018-01-19 (×2): qty 1

## 2018-01-19 NOTE — ED Notes (Signed)
Attempted flushing GJ tube with pancreatic enzymes. J tube flushes with ease but not G tube. Allowing enzymes with coca cola to dwell in G tube lumen now .

## 2018-01-19 NOTE — ED Provider Notes (Signed)
[facility] DEPT Provider Note   CSN: [id number] Arrival date & time: 01/19/18  1948     History   Chief Complaint No chief complaint on file.   HPI Cory Ibarra is a 57 y.o. male.  HPI  Patient presents due to a clogged G-tube. Patient is a hospice patient, lives in a nursing facility currently. Over the day the patient's feeding tube has become clogged. He denies any other complaints, states he is otherwise in his usual state of health. He is awake and alert, answers questions appropriately. He has had a similar event in the recent past, requiring evaluation in the emergency department.  Past Medical History:  Diagnosis Date  . Cancer ([place])   . Tendonitis     Patient Active Problem List   Diagnosis Date Noted  . Jejunostomy tube in situ ([place]) 12/23/2017  . Calculus of ureter   . Malnutrition ([place])   . Aspiration pneumonia due to regurgitated food ([place])   . Goals of care, counseling/discussion   . Sinus tachycardia 11/06/2017  . Lymphoma ([place]) 11/06/2017  . Lytic bone lesions on xray 11/06/2017  . Pressure injury, unstageable ([place]) 10/04/2017  . Dehydration   . Hypoxia   . Other pulmonary embolism without acute cor pulmonale ([place]) 10/03/2017  . Healthcare-associated pneumonia   . Obstructive uropathy   . Liver function test abnormality   . Renal calculus or stone 09/19/2017  . Leakage of nephrostomy catheter ([place]) 09/19/2017  . Nephrostomy status ([place]) 09/19/2017  . Gastroesophageal reflux disease without esophagitis 09/19/2017  . Hypokalemia 09/18/2017  . Hypomagnesemia 09/18/2017  . Electrolyte abnormality   . Hypovolemia 09/17/2017  . Chemotherapy-induced nausea 09/17/2017  . Constipation 09/17/2017  . DLBCL (diffuse large B cell lymphoma) ([place]) 09/17/2017  . Hypocalcemia 09/17/2017  . Severe protein-calorie malnutrition ([place])   . Adjustment disorder with mixed anxiety and depressed mood   . Palliative care encounter   .  Septic shock due to Escherichia coli ([place])   . Neutropenia ([place]) 08/21/2017  . Gross hematuria   . Lactic acidosis   . Neutropenic sepsis ([place])   . Hypophosphatemia 08/20/2017  . Diffuse large B cell lymphoma ([place]) 07/03/2017  . Bony metastasis ([place]) 06/29/2017  . Chest wall mass   . 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 Closed compression fracture of L3 lumbar vertebra ([place]) 06/28/2017  . Severe back pain 06/28/2017  . Weight loss, unintentional 06/28/2017    Past Surgical History:  Procedure Laterality Date  . CYSTOSCOPY W/ URETERAL STENT PLACEMENT Right 08/21/2017   Procedure: CYSTOSCOPY WITH RETROGRADE PYELOGRAM, RIGHT;  Surgeon: Alexis Frock, MD;  Location: [facility];  Service: Urology;  Laterality: Right;  . HERNIA REPAIR  1988   Dr. Vida Rigger  . IR FLUORO GUIDE PORT INSERTION RIGHT  07/26/2017  . IR GASTR TUBE CONVERT GASTR-JEJ PER W/FL MOD SED  11/09/2017  . IR GASTROSTOMY TUBE MOD SED  11/02/2017  . IR NEPHROSTOMY EXCHANGE RIGHT  10/04/2017  . IR NEPHROSTOMY PLACEMENT RIGHT  08/21/2017  . IR REPLC DUODEN/JEJUNO TUBE PERCUT W/FLUORO  12/19/2017  . IR US GUIDE VASC ACCESS RIGHT  07/26/2017       Home Medications    Prior to Admission medications   Medication Sig Start Date End Date Taking? Authorizing Provider  apixaban (ELIQUIS) 5 MG TABS tablet Take 5 mg by mouth 2 (two) times daily. Starting 11/22/17 11/22/17   [provider]  chlorhexidine (PERIDEX) 0.12 % solution 15 mLs by Mouth Rinse route 2 (two) times  daily. 09/03/17   Theodis Blaze, MD  Lidocaine-Prilocaine, Bulk, 2.5-2.5 % CREA Apply to affected areas topically one time daily    [provider]  metoCLOPramide (REGLAN) 10 MG tablet Take 1 tablet (10 mg total) by mouth 3 (three) times daily before meals. 11/14/17   Barton Dubois, MD  OLANZapine zydis (ZYPREXA) 5 MG disintegrating tablet Take 0.5 tablets (2.5 mg total) by mouth at bedtime. 09/03/17   Theodis Blaze, MD  oxyCODONE (OXYCONTIN) 10 mg 12 hr  tablet Take 1 tablet (10 mg total) by mouth every 12 (twelve) hours. 11/30/17   Gerlene Fee, NP  Oxycodone HCl 10 MG TABS Take 1 tablet (10 mg total) by mouth every 6 (six) hours as needed (for pain). 11/29/17   Gerlene Fee, NP  pantoprazole (PROTONIX) 40 MG tablet Take 40 mg by mouth daily with lunch.     [provider]  polyethylene glycol (MIRALAX / GLYCOLAX) packet Take 17 g daily as needed by mouth for moderate constipation.     [provider]  predniSONE (DELTASONE) 10 MG tablet Take 2 tablets (20 mg total) daily with breakfast by mouth. 10/08/17   Patrecia Pour, MD  senna-docusate (SENOKOT-S) 8.6-50 MG tablet Take 1 tablet by mouth daily as needed for mild constipation.    [provider]  sulfamethoxazole-trimethoprim (BACTRIM,SEPTRA) 400-80 MG tablet Take 2 tablets by mouth 3 (three) times a week. 11/16/17   Barton Dubois, MD    Family History Family History  Problem Relation Age of Onset  . Cancer Father     Social History Social History   Tobacco Use  . Smoking status: Never Smoker  . Smokeless tobacco: Never Used  Substance Use Topics  . Alcohol use: Yes    Comment: occasionally/socially  . Drug use: No     Allergies   Bee venom   Review of Systems Review of Systems  Constitutional: Negative for fever.  Respiratory: Negative for shortness of breath.   Cardiovascular: Negative for chest pain.  Musculoskeletal:       Negative aside from HPI  Skin:       Negative aside from HPI  Allergic/Immunologic: Negative for immunocompromised state.  Neurological: Negative for weakness.     Physical Exam Updated Vital Signs BP 118/77 (BP Location: Right Arm)   Pulse (!) 102   Temp 99.2 F (37.3 C) (Oral)   Resp 16   SpO2 93%   Physical Exam  Constitutional: He is oriented to person, place, and time. He appears well-developed. He has a sickly appearance. No distress.  HENT:  Head: Normocephalic and atraumatic.  Eyes:  Conjunctivae and EOM are normal.  Pulmonary/Chest: Effort normal. No respiratory distress.  Abdominal: He exhibits no distension.    Neurological: He is alert and oriented to person, place, and time.  Skin: Skin is warm and dry.  Psychiatric: He has a normal mood and affect.  Nursing note and vitals reviewed.    ED Treatments / Results   Procedures Procedures (including critical care time)  Medications Ordered in ED Medications - No data to display   Initial Impression / Assessment and Plan / ED Course  I have reviewed the triage vital signs and the nursing notes.  Pertinent labs & imaging results that were available during my care of the patient were reviewed by me and considered in my medical decision making (see chart for details).  Patient presents from nursing facility with clogged feeding tube.  Multiple attempts were made  to unclog the feeding tube, with success and unclogging the jejunostomy lumen, but the gastric lumen remained occluded in spite of interventions. Patient tolerated all of these attempts well, with no complaints throughout, no change in hemodynamic status. Patient confirms that he uses the tube for supplemental feeds, but is tolerant of oral intake, both liquids and solids, and takes medication orally. Given his lack of dependency on his GJ tube, is appropriate for non-emergent replacement. We discussed the option of staying in the hospital until interventional radiology is available, in 2 days or returning to his nursing facility, to return as an outpatient. Patient voiced a preference for this procedure to be done at that time rather than stay in the hospital.  Given the aforementioned lack of dependency on his GJ tube, this is a reasonable option. Absent any other complaints, no hemodynamic instability, no pain, the patient was returned to his nursing facility.   Final Clinical Impressions(s) / ED Diagnoses  Dysfunction of feeding tube, initial  encounter   Carmin Muskrat, MD 01/19/18 2250

## 2018-01-19 NOTE — Discharge Instructions (Signed)
On Monday it is important that you contact the interventional radiology team for replacement of your GJ tube. Please continue to take all medications orally, drink plenty of fluids, and monitor your condition carefully. Return here for concerning changes in your condition.

## 2018-01-19 NOTE — ED Notes (Signed)
Bed: WA21 Expected date:  Expected time:  Means of arrival:  Comments: ems 

## 2018-01-19 NOTE — ED Notes (Signed)
[place], RN attempted to unclog G tube with appropriate device from supply room. No success at this time. Unable to flush G-tube. MD aware.

## 2018-01-19 NOTE — ED Triage Notes (Signed)
Pt from [facility]. Has clogged G tube. Present for similar situation recently. VS: BP 116/68 HR 84 RR 16. No c/o pain.

## 2018-01-19 NOTE — ED Notes (Signed)
AVS explained to patient. Attempted to call facility with no answer. Will retry in 10 minutes. All information given to PTAR. This nurse wrote on AVS to call IR on Monday with their phone number and with instructions, "Please flush GJ tube after ALL medications and after food administration." Pt A&Ox4 upon discharge. VSS. In NAD.

## 2018-01-21 ENCOUNTER — Other Ambulatory Visit (HOSPITAL_COMMUNITY): Payer: Self-pay | Admitting: Emergency Medicine

## 2018-01-21 ENCOUNTER — Non-Acute Institutional Stay (SKILLED_NURSING_FACILITY): Payer: Medicaid Other | Admitting: Adult Health

## 2018-01-21 DIAGNOSIS — Z934 Other artificial openings of gastrointestinal tract status: Secondary | ICD-10-CM | POA: Diagnosis not present

## 2018-01-21 DIAGNOSIS — T85598A Other mechanical complication of other gastrointestinal prosthetic devices, implants and grafts, initial encounter: Secondary | ICD-10-CM

## 2018-01-21 DIAGNOSIS — C8338 Diffuse large B-cell lymphoma, lymph nodes of multiple sites: Secondary | ICD-10-CM | POA: Diagnosis not present

## 2018-01-21 DIAGNOSIS — C7951 Secondary malignant neoplasm of bone: Secondary | ICD-10-CM

## 2018-01-21 NOTE — Progress Notes (Signed)
Location:   [facility] Room Number: 086 Place of Service:  SNF (31)   CODE STATUS: dnr  Allergies  Allergen Reactions  . Bee Venom Swelling    Chief Complaint  Patient presents with  . Acute Visit    follow up ED    HPI:  He was taken to the ED yesterday due to blockage in his J tube. He will need to go to I/R in the AM to replace his tube. He does continue to have pain managemnet issues. He rates his pain 4/10 and would like to have his pain better managed. He is presently on MS contin 15 mg twice daily  And has oxycodone 10 mg every 6 hours as needed. I have spoken with the hospice nurse. We will change his prn oxycodone to every 6 hours routinely. He denies any nausea/vomiting; he continues to have a poor appetite.    Past Medical History:  Diagnosis Date  . Cancer ([place])   . Tendonitis     Past Surgical History:  Procedure Laterality Date  . CYSTOSCOPY W/ URETERAL STENT PLACEMENT Right 08/21/2017   Procedure: CYSTOSCOPY WITH RETROGRADE PYELOGRAM, RIGHT;  Surgeon: Alexis Frock, MD;  Location: [facility];  Service: Urology;  Laterality: Right;  . HERNIA REPAIR  1988   Dr. Vida Rigger  . IR FLUORO GUIDE PORT INSERTION RIGHT  07/26/2017  . IR GASTR TUBE CONVERT GASTR-JEJ PER W/FL MOD SED  11/09/2017  . IR GASTROSTOMY TUBE MOD SED  11/02/2017  . IR NEPHROSTOMY EXCHANGE RIGHT  10/04/2017  . IR NEPHROSTOMY PLACEMENT RIGHT  08/21/2017  . IR REPLC DUODEN/JEJUNO TUBE PERCUT W/FLUORO  12/19/2017  . IR US GUIDE VASC ACCESS RIGHT  07/26/2017    Social History   Socioeconomic History  . Marital status: Single    Spouse name: Not on file  . Number of children: 0  . Years of education: Not on file  . Highest education level: Not on file  Social Needs  . Financial resource strain: Not on file  . Food insecurity - worry: Not on file  . Food insecurity - inability: Not on file  . Transportation needs - medical: Not on file  . Transportation needs - non-medical: Not  on file  Occupational History  . Not on file  Tobacco Use  . Smoking status: Never Smoker  . Smokeless tobacco: Never Used  Substance and Sexual Activity  . Alcohol use: Yes    Comment: occasionally/socially  . Drug use: No  . Sexual activity: No  Other Topics Concern  . 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 Never used smokeless tobacco.   No e-cigarettes   Occasional alcohol consumption socially.   Single, no children.   Family History  Problem Relation Age of Onset  . Cancer Father       VITAL SIGNS BP 96/62   Pulse 96   Temp (!) 96.8 F (36 C)   Resp 18   Ht 6' 2"  (1.88 m)   Wt 108 lb (49 kg)   SpO2 98%   BMI 13.87 kg/m    Outpatient Encounter Medications as of 01/21/2018  Medication Sig  . metoCLOPramide (REGLAN) 10 MG tablet Take 1 tablet (10 mg total) by mouth 3 (three) times daily before meals.  Marland Kitchen OLANZapine zydis (ZYPREXA) 5 MG disintegrating tablet Take 0.5 tablets (2.5 mg total) by mouth at bedtime.  . Oxycodone HCl  10 MG TABS Take 1 tablet (10 mg total) by mouth every 6 (six) hours as needed (for pain).  . pantoprazole (PROTONIX) 40 MG tablet Take 40 mg by mouth daily with lunch.   . polyethylene glycol (MIRALAX / GLYCOLAX) packet Take 17 g daily as needed by mouth for moderate constipation.   . predniSONE (DELTASONE) 10 MG tablet Take 2 tablets (20 mg total) daily with breakfast by mouth.  . senna-docusate (SENOKOT-S) 8.6-50 MG tablet Take 1 tablet by mouth daily as needed for mild constipation.  . sulfamethoxazole-trimethoprim (BACTRIM,SEPTRA) 400-80 MG tablet Take 2 tablets by mouth 3 (three) times a week.  . [DISCONTINUED] apixaban (ELIQUIS) 5 MG TABS tablet Take 5 mg by mouth 2 (two) times daily. Starting 11/22/17  . [DISCONTINUED] chlorhexidine (PERIDEX) 0.12 % solution 15 mLs by Mouth Rinse route 2 (two) times daily.  . [DISCONTINUED] Lidocaine-Prilocaine, Bulk, 2.5-2.5 % CREA  Apply to affected areas topically one time daily  . [DISCONTINUED] oxyCODONE (OXYCONTIN) 10 mg 12 hr tablet Take 1 tablet (10 mg total) by mouth every 12 (twelve) hours.   Facility-Administered Encounter Medications as of 01/21/2018  Medication  . sodium chloride flush (NS) 0.9 % injection 10 mL     SIGNIFICANT DIAGNOSTIC EXAMS  PREVIOUS:   08-21-17:chest x-ray: No active cardiopulmonary disease.  08-21-17: lumbar spine complete x-ray: There is again noted fracture L3 vertebral body. Tumor extends into and involves the left L3 pedicle. This finding was present on prior lumbar MR. No new bone lesions are evident. No new fracture. No spondylolisthesis. There is facet osteoarthritic change at L4-5 L5-S1 bilaterally. There is also loss of the pedicle on the right at T12, indicative of bony metastatic disease in this area.  08-21-17: thoracic spine x-ray: No fracture or spondylolisthesis. There are multiple metastatic foci Seen in the thoracic spine performed three months prior. By radiography, there is loss of the pedicle of T12 on the right and subtle loss of the lateral aspect of the pedicle of T8 on the left. 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 No other neoplastic foci identified by radiography the bony structures. Note that there are multiple foci of tumor and thoracic vertebral bodies seen on recent MR not seen by radiography currently.   08-22-17: placement of right nephrostomy placement  10-03-17: chest x-ray: No active cardiopulmonary disease.   10-03-17: ct angio of chest:  1. Acute pulmonary embolism within the LEFT lower lobe and lingula. These small emboli are nonocclusive. Overall clot burden is minimal. 2. No evidence of RIGHT ventricular strain. 3. Patchy airspace disease in the LEFT lower lobe. Favor pulmonary infection such as pneumonia or aspiration rather than pulmonary infarction. 4. Frothy material within the LEFT lower lobe bronchus could  represent aspirate from tracheal aspiration.  10-04-17 right nephrostomy stent replacement   10-16-17: renal ultrasound:  No etiology for the bloody nephrostomy tube drainage is observed. The proximal pigtail of the nephrostomy tube appears to lie in the mid renal collecting system. No hydronephrosis on the right or left.  10-19-17: ct of neck:  1. No prior head or neck CT. 2. 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 Associated pathologic fractures of the right C1 lateral mass and the C4 vertebral body. 3. No lymphadenopathy in the neck. 4. There is a 2 cm circumscribed cyst in the posterior right hypopharynx contiguous with the right AE fold which appears to be new since the CTA chest 10/03/2017. Favor  this is a benign retention cyst. 5. Right mastoid effusion, likely reactive to #2.  10-19-17: ct of chest abdomen and pelvis:  1. Overall improved CT appearance of the patient's lymphoma most significantly involving the osseous structures. No new or progressive bony findings. 2. Significant improvement of the retroperitoneal lymph nodes. No new or progressive findings in the abdomen or pelvis. 3. Right-sided externalized nephrostomy tube with obstructing upper ureteral calculi and associated mild to moderate hydroureteronephrosis. 4. Stable lung findings.  10-19-17: NM bone scan whole body: Multiple bony metastases as described. Focal increased activity overlying the right maxilla may represent dental disease or possibly metastasis.  10-27-17: MRI cervical; thoracic; lumbar spine:  1. Multiple vertebral body, posterior element, rib, sacrum, and left scapula enhancing metastasis. 2. No epidural, leptomeningeal, or cord metastasis. 3. Pathologic mild C4, mild T10, mild T12, and moderate L3 compression deformities. 4. Mild bony retropulsion of L3 vertebral body eccentric to left with mild canal and severe left L3-4 foraminal stenosis. 5. Cystic  lesion in right posterior hypopharynx measuring up to 22 mm, direct visualization recommended.  12-09-17: chest x-ray: No acute pulmonary process identified. 12-09-17    12-09-17: lumbar spine x-ray:  1. Chronic L3 logic compression fracture. 2. No evidence of acute fracture. 3. Additional right iliac bone lesions on prior MRI and CT are not well visualized radiographically.  NO NEW EXAMS     LABS REVIEWED: PREVIOUS:   08-21-17: wbc 0.1; hgb 9.8; hct 27.6; mcv 71.7; plt 76; glucose 144 bun 12; creat 0.63; k+ 3.5; na++ 135; ca 6.9; liver normal albumin 3.1; phos 1.9; BNP 607.1; uric acid 3.8 blood culture: enterobacteriaceae; e-coli 08-24-17: wbc 5.3; hgb 8.7; hct 249; mcv 73.7; plt 57; glucose 93; bun 11; creat 0.38; k+ 2.7; na++ 139; ca 6.3; albumin 2.3 08-25-17: blood culture: no growth  08-27-17: wbc 13.4; hgb 8.7; hct 25.2; mcv 73.5; plt 63; glucose 92; bun 7; creat 0.31; k+ 3.0; na++ 140; ca 6.3; alt 73; albumin 2.3  08-30-17: wbc 6.2; hgb 9.0; hct 26.5; mcv 74.9; plt 120; glucose 109; bun <5' creat <0.30; k+ 3.4; na++ 135; ca 6.2; liver normal albumin 2.3; mag 1.6; phos 1.7 09-03-17: wbc 6.3; hgb 10.0; hct 29.5; mcv 74.1; plt 403; glucose 103; bun 8; creat 0.38; k+ 3.1; na++ 136; mag 1.7; phos 1.9  09-17-17: wbc 10.4; hgb 11.6; hct 35.4; mcv 75.0; plt 453; glucose 108; bun 7.3; creat 0.5; k+ 3.5; na++ 135; ca 6.4; total bili 1.26; albumin 2.4; uric acid 6.8; mag 1.6; phos 1.9 09-21-17: wbc 30.8; hgb 8.4; hct 26.0; mcv 78.3; plt 252; uric acid 1.9; mag 1.7; phos 1.6 09-24-17: wbc 13.7; hgb 8.0; hct 26.1; mcv 78.4; plt 145; glucose 87; bun <5; creat 0.30; k+ 4.3; na+= 137; ca 6.5; ast 103; alt 76; alk phos 195; albumin 2.2; uric acid 1.6; mag 1.7; phos 2.3; vit D 8.3 10-03-17: wbc 3.4; hgb 9.2; hct 29.9; mcv 80.8; plt 415; glucose 97; bun 10; creat 0.45; k+ 3.0; na++ 139; ca 6.9; indirect bili 1.3; alk phos 158 albumin 2.8; mag 2.0; phos 1.6; blood culture: no growth; urine culture: 50,000  e-coli 10-05-17: wbc 2.9; hgb 7.8; hct 24.6; mcv 80.7 ;plt 329; glucose 92; bun 9; creat 0.36; k+ 3.0; na++ 140; ca 6.4; mag 1.7; phos 1.8; ionized calcium 3.5 10-08-17: wbc 3.2; hgb 9.0; hct 28.6; mcv 81.7; plt 318; glucose 99; bun <5; creat 0.31; k+ 3.5; na++ 140; ca 6.9; liver normal albumin 2.3; mag 1.6; phos 2.4  10-16-17:  wbc 9.6; hgb 11.2; hct 35.3; mcv 81.1; plt 378; glucose 90; bun 5; creat 0.38; k+2.7; na++ 138; ca 6.4; total bili 1.5; albumin 2.9; mag 2.2; tsh 2.260 10-23-17: wbc glucose 80; bun <5; creat <0.30; k+ 3.3; na++ 136; ca 5.8 10-26-17: glucose 85; bun <5; creat <0.30; k+3.3; na++ 138; ca 6.6 mag 2.2; phos 2.7; vit D 33.7 11-07-17: wbc 11.8; hgb 8.7; hct 26.8; mcv 84.5, plt 138; glucose 84; bun 12; creat <0.30; k+ 3.6; na++ 136; ca 6.8; albumin 2.0 11-14-17: wbc 11.0; hgb 9.1; hct 28.5; mcv 84.3; plt 376 glucose 104; bun 13; creat <0.30; k+ 4.0; na++ 138; ca 7.7; liver normal albumin 2.3; mag 2.0; phos 3.7 12-09-17: wbc 17.7; hgb 10.2; hct 32.2; mcv 81.9 ;plt 403; glucose 110; bun 16; creat 0.36; k+ 4.0; na++ 136 ca 8.6     NO NEW LABS    Review of Systems  Constitutional: Negative for malaise/fatigue.  Respiratory: Negative for cough and shortness of breath.   Cardiovascular: Negative for chest pain, palpitations and leg swelling.  Gastrointestinal: Negative for abdominal pain, constipation and heartburn.  Musculoskeletal: Positive for back pain and myalgias. Negative for joint pain.  Skin: Negative.   Neurological: Negative for dizziness.  Psychiatric/Behavioral: The patient is not nervous/anxious.     Physical Exam  Constitutional: He is oriented to person, place, and time. No distress.  Frail   Neck: No thyromegaly present.  Cardiovascular: Normal rate, regular rhythm and intact distal pulses.  Murmur heard. 1/6  Pulmonary/Chest: Effort normal and breath sounds normal. No respiratory distress.  Abdominal: Soft. Bowel sounds are normal. He exhibits no distension.   j tube present; without signs of infection present   Musculoskeletal: Normal range of motion. He exhibits no edema.  Lymphadenopathy:    He has no cervical adenopathy.  Neurological: He is alert and oriented to person, place, and time.  Skin: Skin is warm and dry. He is not diaphoretic.  Psychiatric: He has a normal mood and affect.    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 Jejunostomy tube in situ  Will have him return to I/R in the AM for tube replacement Will change his oxycodone to 10 mg every 6 hours routinely and will monitor his pain management    MD is aware of resident's narcotic use and is in agreement with current plan of care. We will attempt to wean resident as apropriate   Ok Edwards NP [facility]  Contact [phone] Monday through Friday 8am- 5pm  After hours call [phone]

## 2018-01-22 ENCOUNTER — Ambulatory Visit (HOSPITAL_COMMUNITY): Admission: RE | Admit: 2018-01-22 | Payer: Medicaid Other | Source: Ambulatory Visit

## 2018-01-24 ENCOUNTER — Other Ambulatory Visit (HOSPITAL_COMMUNITY): Payer: Self-pay | Admitting: Emergency Medicine

## 2018-01-24 ENCOUNTER — Encounter (HOSPITAL_COMMUNITY): Payer: Self-pay | Admitting: Interventional Radiology

## 2018-01-24 ENCOUNTER — Ambulatory Visit (HOSPITAL_COMMUNITY)
Admission: RE | Admit: 2018-01-24 | Discharge: 2018-01-24 | Disposition: A | Discharge status: Home / Self Care | Payer: Medicaid Other | Source: Ambulatory Visit | Attending: Emergency Medicine | Admitting: Emergency Medicine

## 2018-01-24 DIAGNOSIS — T85598A Other mechanical complication of other gastrointestinal prosthetic devices, implants and grafts, initial encounter: Secondary | ICD-10-CM

## 2018-01-24 DIAGNOSIS — Z434 Encounter for attention to other artificial openings of digestive tract: Secondary | ICD-10-CM | POA: Diagnosis not present

## 2018-01-24 HISTORY — PX: IR CM INJ ANY COLONIC TUBE W/FLUORO: IMG2336

## 2018-01-24 MED ORDER — IOPAMIDOL (ISOVUE-300) INJECTION 61%
INTRAVENOUS | Status: AC
  Administered 2018-01-24: 10 mL
  Filled 2018-01-24: qty 50

## 2018-01-25 ENCOUNTER — Non-Acute Institutional Stay (SKILLED_NURSING_FACILITY): Payer: Medicaid Other | Admitting: Adult Health

## 2018-01-25 ENCOUNTER — Encounter: Payer: Self-pay | Admitting: Adult Health

## 2018-01-25 DIAGNOSIS — R627 Adult failure to thrive: Secondary | ICD-10-CM | POA: Diagnosis not present

## 2018-01-25 DIAGNOSIS — E43 Unspecified severe protein-calorie malnutrition: Secondary | ICD-10-CM

## 2018-01-25 DIAGNOSIS — C8338 Diffuse large B-cell lymphoma, lymph nodes of multiple sites: Secondary | ICD-10-CM | POA: Diagnosis not present

## 2018-01-25 DIAGNOSIS — C7951 Secondary malignant neoplasm of bone: Secondary | ICD-10-CM

## 2018-01-25 NOTE — Progress Notes (Signed)
Location:   [facility] Room Number: 096 Place of Service:  SNF (31)   CODE STATUS: DNR: (MOST reviewed 11-22-17)  Allergies  Allergen Reactions  . Bee Venom Swelling    Chief Complaint  Patient presents with  . Acute Visit    patient status     HPI:  He did go to interventional radiology yesterday to de-clog his J tube. I/R had difficulty de-clogging his tube. He does want to continue his tube feedings and does desire to go beck to de-clog tube again if necessary. He is aware that his tube may become nonfunctional in the future. He does continue to lose weight despite being on tube feeding. He is presently denying any nausea or vomiting. He will not eat while receiving his feeding. The purpose of his tube feeding is palliative only. It will be almost impossible for him to gain weight due to his cancer and wounds. He does have pain which he rates 5/10. He is presently taking ms contin 15 mg twice daily and oxycodone 10 mg every 6 hours routinely. The pain is focused on his back. We did discuss his medication regimen and is willing to increase his ms contin. He continues to be followed by hospice care.   Past Medical History:  Diagnosis Date  . Cancer ([place])   . Tendonitis     Past Surgical History:  Procedure Laterality Date  . CYSTOSCOPY W/ URETERAL STENT PLACEMENT Right 08/21/2017   Procedure: CYSTOSCOPY WITH RETROGRADE PYELOGRAM, RIGHT;  Surgeon: Alexis Frock, MD;  Location: [facility];  Service: Urology;  Laterality: Right;  . HERNIA REPAIR  1988   Dr. Vida Rigger  . IR CM INJ ANY COLONIC TUBE W/FLUORO  01/24/2018  . IR FLUORO GUIDE PORT INSERTION RIGHT  07/26/2017  . IR GASTR TUBE CONVERT GASTR-JEJ PER W/FL MOD SED  11/09/2017  . IR GASTROSTOMY TUBE MOD SED  11/02/2017  . IR NEPHROSTOMY EXCHANGE RIGHT  10/04/2017  . IR NEPHROSTOMY PLACEMENT RIGHT  08/21/2017  . IR REPLC DUODEN/JEJUNO TUBE PERCUT W/FLUORO  12/19/2017  . IR US GUIDE VASC ACCESS RIGHT  07/26/2017      Social History   Socioeconomic History  . Marital status: Single    Spouse name: Not on file  . Number of children: 0  . Years of education: Not on file  . Highest education level: Not on file  Social Needs  . Financial resource strain: Not on file  . Food insecurity - worry: Not on file  . Food insecurity - inability: Not on file  . Transportation needs - medical: Not on file  . Transportation needs - non-medical: Not on file  Occupational History  . Not on file  Tobacco Use  . Smoking status: Never Smoker  . Smokeless tobacco: Never Used  Substance and Sexual Activity  . Alcohol use: Yes    Comment: occasionally/socially  . Drug use: No  . Sexual activity: No  Other Topics Concern  . 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 Never used smokeless tobacco.   No e-cigarettes   Occasional alcohol consumption socially.   Single, no children.   Family History  Problem Relation Age of Onset  . Cancer Father       VITAL SIGNS BP 96/62   Pulse 96   Temp (!) 96.8 F (36 C)   Resp 18   Ht 6' 2"  (1.88 m)  Wt 108 lb (49 kg)   SpO2 98%   BMI 13.87 kg/m   Outpatient Encounter Medications as of 01/25/2018  Medication Sig  . aspirin 81 MG chewable tablet Chew 81 mg by mouth daily.  . metoCLOPramide (REGLAN) 10 MG tablet Take 1 tablet (10 mg total) by mouth 3 (three) times daily before meals.  Marland Kitchen morphine (MS CONTIN) 15 MG 12 hr tablet Take 15 mg by mouth every 12 (twelve) hours.  Marland Kitchen OLANZapine zydis (ZYPREXA) 5 MG disintegrating tablet Take 0.5 tablets (2.5 mg total) by mouth at bedtime.  . Oxycodone HCl 10 MG TABS Take 10 mg by mouth every 6 (six) hours.  . pantoprazole (PROTONIX) 40 MG tablet Take 40 mg by mouth daily with lunch.   . polyethylene glycol (MIRALAX / GLYCOLAX) packet Take 17 g daily as needed by mouth for moderate constipation.   . predniSONE (DELTASONE) 10 MG tablet Take 2 tablets  (20 mg total) daily with breakfast by mouth.  . senna-docusate (SENOKOT-S) 8.6-50 MG tablet Take 1 tablet by mouth daily as needed for mild constipation.  . sulfamethoxazole-trimethoprim (BACTRIM,SEPTRA) 400-80 MG tablet Take 2 tablets by mouth 3 (three) times a week.  . [DISCONTINUED] Oxycodone HCl 10 MG TABS Take 1 tablet (10 mg total) by mouth every 6 (six) hours as needed (for pain).   Facility-Administered Encounter Medications as of 01/25/2018  Medication  . sodium chloride flush (NS) 0.9 % injection 10 mL     SIGNIFICANT DIAGNOSTIC EXAMS  PREVIOUS:   08-21-17:chest x-ray: No active cardiopulmonary disease.  08-21-17: lumbar spine complete x-ray: There is again noted fracture L3 vertebral body. Tumor extends into and involves the left L3 pedicle. This finding was present on prior lumbar MR. No new bone lesions are evident. No new fracture. No spondylolisthesis. There is facet osteoarthritic change at L4-5 L5-S1 bilaterally. There is also loss of the pedicle on the right at T12, indicative of bony metastatic disease in this area.  08-21-17: thoracic spine x-ray: No fracture or spondylolisthesis. There are multiple metastatic foci Seen in the thoracic spine performed three months prior. By radiography, there is loss of the pedicle of T12 on the right and subtle loss of the lateral aspect of the pedicle of T8 on the left. 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 No other neoplastic foci identified by radiography the bony structures. Note that there are multiple foci of tumor and thoracic vertebral bodies seen on recent MR not seen by radiography currently.   08-22-17: placement of right nephrostomy placement  10-03-17: chest x-ray: No active cardiopulmonary disease.   10-03-17: ct angio of chest:  1. Acute pulmonary embolism within the LEFT lower lobe and lingula. These small emboli are nonocclusive. Overall clot burden is minimal. 2. No evidence of RIGHT  ventricular strain. 3. Patchy airspace disease in the LEFT lower lobe. Favor pulmonary infection such as pneumonia or aspiration rather than pulmonary infarction. 4. Frothy material within the LEFT lower lobe bronchus could represent aspirate from tracheal aspiration.  10-04-17 right nephrostomy stent replacement   10-16-17: renal ultrasound:  No etiology for the bloody nephrostomy tube drainage is observed. The proximal pigtail of the nephrostomy tube appears to lie in the mid renal collecting system. No hydronephrosis on the right or left.  10-19-17: ct of neck:  1. No prior head or neck CT. 2. 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 Associated pathologic fractures of the right C1 lateral  mass and the C4 vertebral body. 3. No lymphadenopathy in the neck. 4. There is a 2 cm circumscribed cyst in the posterior right hypopharynx contiguous with the right AE fold which appears to be new since the CTA chest 10/03/2017. Burtis Junes this is a benign retention cyst. 5. Right mastoid effusion, likely reactive to #2.  10-19-17: ct of chest abdomen and pelvis:  1. Overall improved CT appearance of the patient's lymphoma most significantly involving the osseous structures. No new or progressive bony findings. 2. Significant improvement of the retroperitoneal lymph nodes. No new or progressive findings in the abdomen or pelvis. 3. Right-sided externalized nephrostomy tube with obstructing upper ureteral calculi and associated mild to moderate hydroureteronephrosis. 4. Stable lung findings.  10-19-17: NM bone scan whole body: Multiple bony metastases as described. Focal increased activity overlying the right maxilla may represent dental disease or possibly metastasis.  10-27-17: MRI cervical; thoracic; lumbar spine:  1. Multiple vertebral body, posterior element, rib, sacrum, and left scapula enhancing metastasis. 2. No epidural, leptomeningeal, or cord  metastasis. 3. Pathologic mild C4, mild T10, mild T12, and moderate L3 compression deformities. 4. Mild bony retropulsion of L3 vertebral body eccentric to left with mild canal and severe left L3-4 foraminal stenosis. 5. Cystic lesion in right posterior hypopharynx measuring up to 22 mm, direct visualization recommended.  12-09-17: chest x-ray: No acute pulmonary process identified. 12-09-17    12-09-17: lumbar spine x-ray:  1. Chronic L3 logic compression fracture. 2. No evidence of acute fracture. 3. Additional right iliac bone lesions on prior MRI and CT are not well visualized radiographically.  NO NEW EXAMS     LABS REVIEWED: PREVIOUS:   08-21-17: wbc 0.1; hgb 9.8; hct 27.6; mcv 71.7; plt 76; glucose 144 bun 12; creat 0.63; k+ 3.5; na++ 135; ca 6.9; liver normal albumin 3.1; phos 1.9; BNP 607.1; uric acid 3.8 blood culture: enterobacteriaceae; e-coli 08-24-17: wbc 5.3; hgb 8.7; hct 249; mcv 73.7; plt 57; glucose 93; bun 11; creat 0.38; k+ 2.7; na++ 139; ca 6.3; albumin 2.3 08-25-17: blood culture: no growth  08-27-17: wbc 13.4; hgb 8.7; hct 25.2; mcv 73.5; plt 63; glucose 92; bun 7; creat 0.31; k+ 3.0; na++ 140; ca 6.3; alt 73; albumin 2.3  08-30-17: wbc 6.2; hgb 9.0; hct 26.5; mcv 74.9; plt 120; glucose 109; bun <5' creat <0.30; k+ 3.4; na++ 135; ca 6.2; liver normal albumin 2.3; mag 1.6; phos 1.7 09-03-17: wbc 6.3; hgb 10.0; hct 29.5; mcv 74.1; plt 403; glucose 103; bun 8; creat 0.38; k+ 3.1; na++ 136; mag 1.7; phos 1.9  09-17-17: wbc 10.4; hgb 11.6; hct 35.4; mcv 75.0; plt 453; glucose 108; bun 7.3; creat 0.5; k+ 3.5; na++ 135; ca 6.4; total bili 1.26; albumin 2.4; uric acid 6.8; mag 1.6; phos 1.9 09-21-17: wbc 30.8; hgb 8.4; hct 26.0; mcv 78.3; plt 252; uric acid 1.9; mag 1.7; phos 1.6 09-24-17: wbc 13.7; hgb 8.0; hct 26.1; mcv 78.4; plt 145; glucose 87; bun <5; creat 0.30; k+ 4.3; na+= 137; ca 6.5; ast 103; alt 76; alk phos 195; albumin 2.2; uric acid 1.6; mag 1.7; phos 2.3; vit D  8.3 10-03-17: wbc 3.4; hgb 9.2; hct 29.9; mcv 80.8; plt 415; glucose 97; bun 10; creat 0.45; k+ 3.0; na++ 139; ca 6.9; indirect bili 1.3; alk phos 158 albumin 2.8; mag 2.0; phos 1.6; blood culture: no growth; urine culture: 50,000 e-coli 10-05-17: wbc 2.9; hgb 7.8; hct 24.6; mcv 80.7 ;plt 329; glucose 92; bun 9; creat 0.36; k+ 3.0; na++ 140;  ca 6.4; mag 1.7; phos 1.8; ionized calcium 3.5 10-08-17: wbc 3.2; hgb 9.0; hct 28.6; mcv 81.7; plt 318; glucose 99; bun <5; creat 0.31; k+ 3.5; na++ 140; ca 6.9; liver normal albumin 2.3; mag 1.6; phos 2.4  10-16-17: wbc 9.6; hgb 11.2; hct 35.3; mcv 81.1; plt 378; glucose 90; bun 5; creat 0.38; k+2.7; na++ 138; ca 6.4; total bili 1.5; albumin 2.9; mag 2.2; tsh 2.260 10-23-17: wbc glucose 80; bun <5; creat <0.30; k+ 3.3; na++ 136; ca 5.8 10-26-17: glucose 85; bun <5; creat <0.30; k+3.3; na++ 138; ca 6.6 mag 2.2; phos 2.7; vit D 33.7 11-07-17: wbc 11.8; hgb 8.7; hct 26.8; mcv 84.5, plt 138; glucose 84; bun 12; creat <0.30; k+ 3.6; na++ 136; ca 6.8; albumin 2.0 11-14-17: wbc 11.0; hgb 9.1; hct 28.5; mcv 84.3; plt 376 glucose 104; bun 13; creat <0.30; k+ 4.0; na++ 138; ca 7.7; liver normal albumin 2.3; mag 2.0; phos 3.7 12-09-17: wbc 17.7; hgb 10.2; hct 32.2; mcv 81.9 ;plt 403; glucose 110; bun 16; creat 0.36; k+ 4.0; na++ 136 ca 8.6     NO NEW LABS    Review of Systems  Constitutional: Negative for malaise/fatigue.  Respiratory: Negative for cough and shortness of breath.   Cardiovascular: Negative for chest pain, palpitations and leg swelling.  Gastrointestinal: Negative for abdominal pain, constipation and heartburn.  Musculoskeletal: Positive for back pain and myalgias. Negative for joint pain.  Skin: Negative.   Neurological: Negative for dizziness.  Psychiatric/Behavioral: The patient is not nervous/anxious.     Physical Exam  Constitutional: He is oriented to person, place, and time. No distress.  Cachexia   Neck: No thyromegaly present.   Cardiovascular: Normal rate, regular rhythm and intact distal pulses.  Murmur heard. 1/6  Pulmonary/Chest: Effort normal and breath sounds normal. No respiratory distress.  Abdominal: Soft. Bowel sounds are normal. He exhibits no distension. There is no tenderness.  J tube site without signs of infection present   Musculoskeletal: He exhibits no edema.  Is able to move all extremities   Lymphadenopathy:    He has no cervical adenopathy.  Neurological: He is alert and oriented to person, place, and time.  Skin: Skin is warm and dry. He is not diaphoretic.  Stage IV: sacrum: 5.1 x 4.1 x 1.3 cm  Undermining at 12 oclock 4.1 cm  Stage IV: left back: 5.4 x 3.9 x 0.5 cm undermining at 12 oclock 2.7 cm  Stage IV: left shoulder: 2.4 x 2.4 x NM cm   Psychiatric: He has a normal mood and affect.      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 Severe protein-calorie malnutrition 4. Failure to thrive in adult  Will change tube feeding to 40 cc per hour from 8 PM to 8 AM  Will give water at 100 cc every 4 hours Will increase MS contin to 30 mg twice daily   Patient is aware that his tube may be able to de-clog his tube in the future; but does want to try if his tube becomes clogged.       MD is aware of resident's narcotic use and is in agreement with current plan of care. We will attempt to wean resident as apropriate   Ok Edwards NP [facility]  Contact [phone] Monday through Friday 8am- 5pm  After hours call [phone]

## 2018-01-26 DIAGNOSIS — R627 Adult failure to thrive: Secondary | ICD-10-CM | POA: Insufficient documentation

## 2018-02-01 DEATH — deceased

## 2018-02-14 ENCOUNTER — Other Ambulatory Visit: Payer: Self-pay | Admitting: Nurse Practitioner

## 2018-03-29 ENCOUNTER — Encounter: Payer: Self-pay | Admitting: Internal Medicine

## 2018-03-30 IMAGING — CR DG LUMBAR SPINE COMPLETE 4+V
5 series · 5 of 5 positions shown · non-contrast
Comparison: Lumbar spine MRI 10/27/2017, CT 10/19/2017

CLINICAL DATA: Unwitnessed fall today. Lumbosacral back pain.
Bilateral leg pain.

EXAM:
LUMBAR SPINE - COMPLETE 4+ VIEW

[t lumbar spine ap]
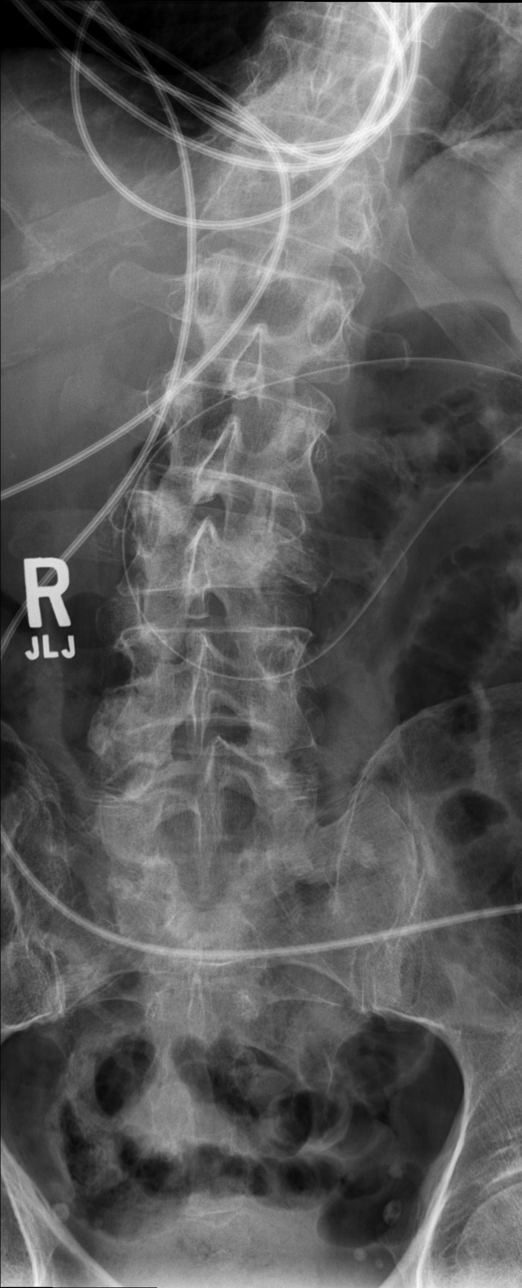

[t lumbar spine obl (1 of 2)]
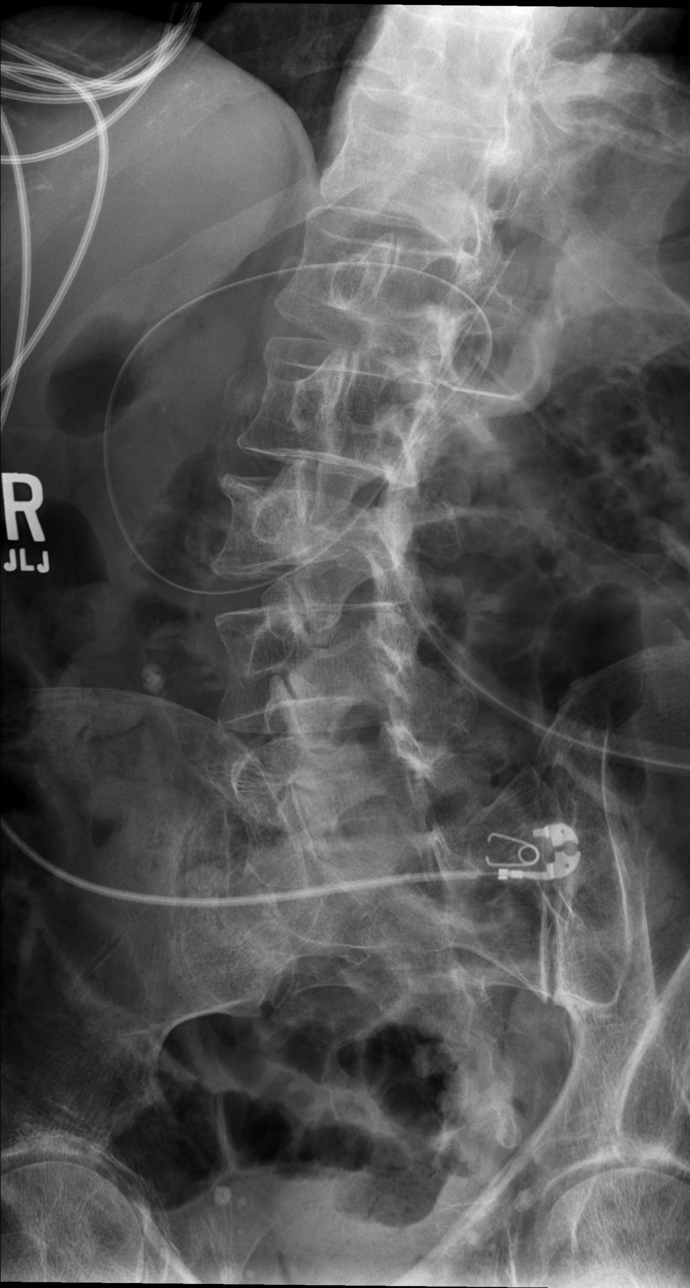

[t lumbar spine obl (2 of 2)]
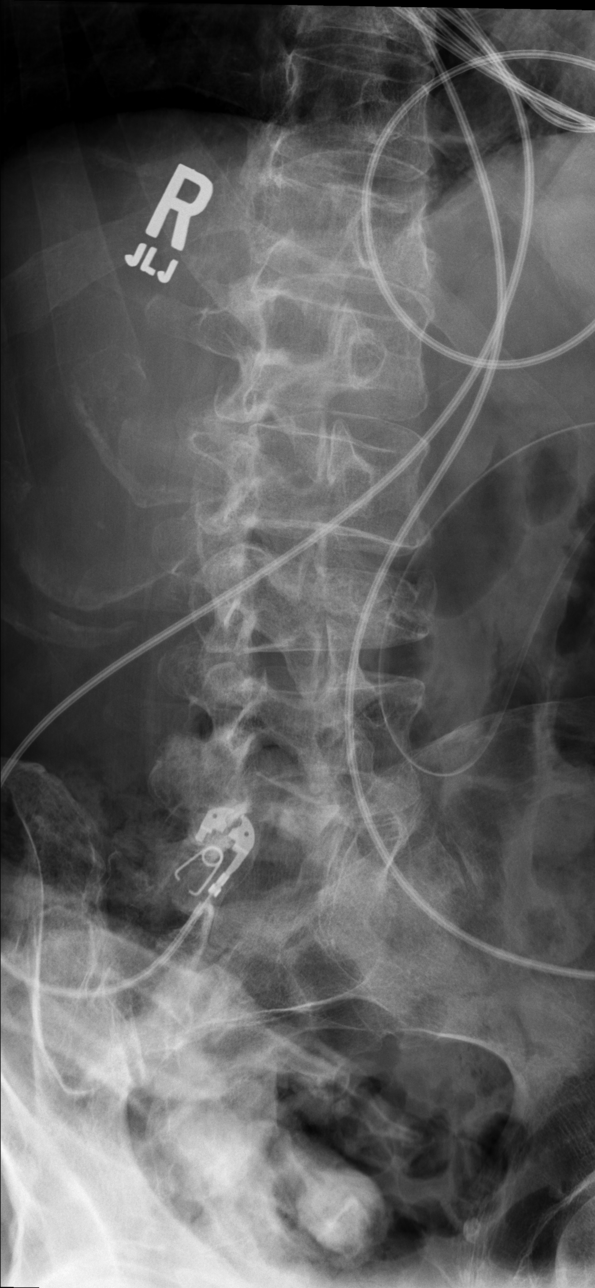

[t lumbar spine lat]
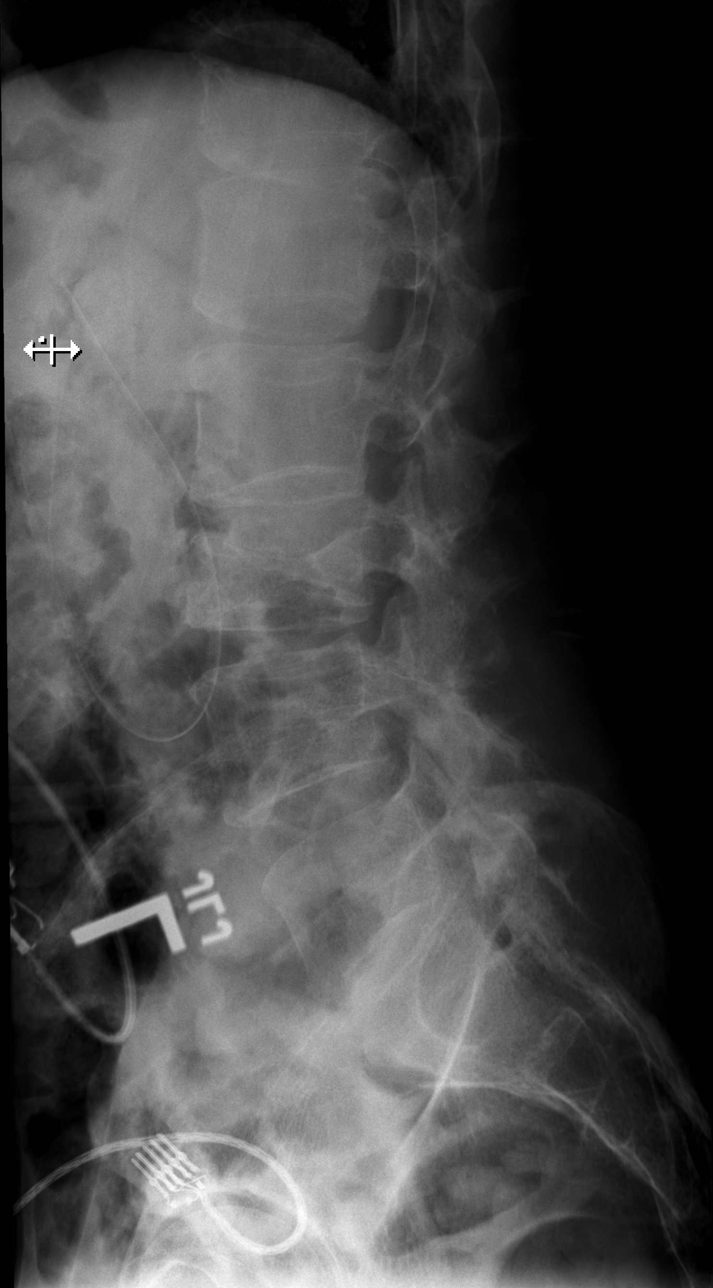

[t lumbar l-5 s-1 spot]
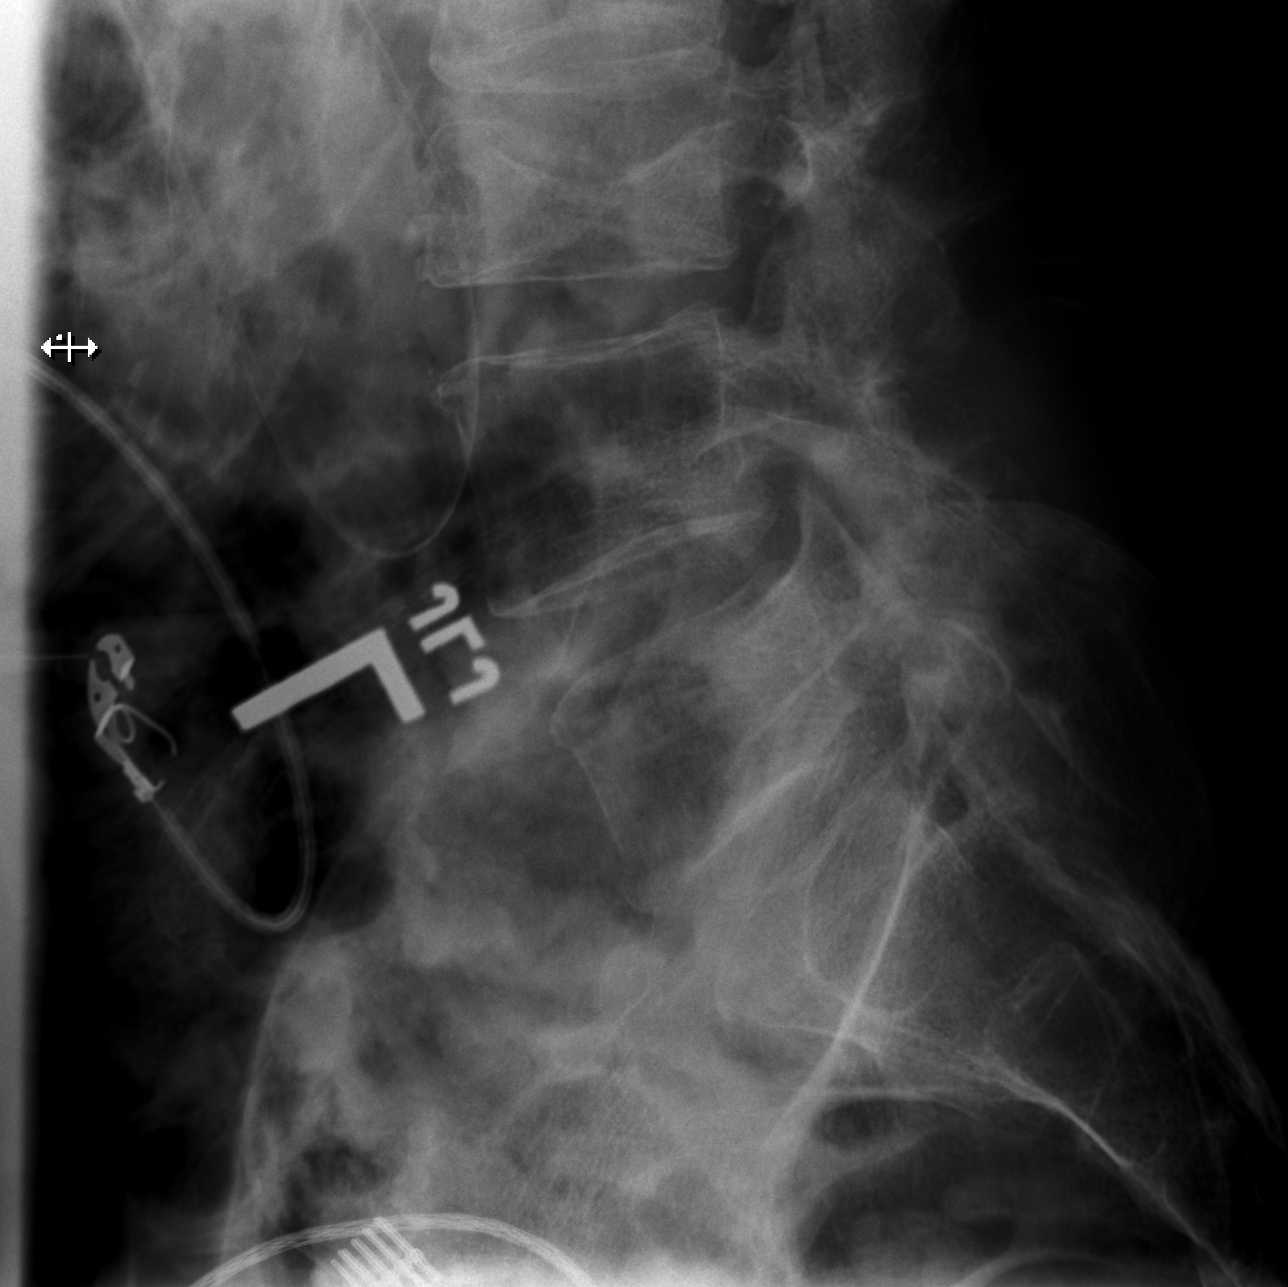

[5 of 5 positions shown; findings below may reference images not displayed]

FINDINGS: Unchanged rightward scoliotic curvature. Chronic compression
fracture of L3 is stable from prior exam. No new compression
fracture. The posterior elements are grossly intact. Known right
sacral lesion not well seen radiographically. Catheter tubing
projects over the abdomen.
IMPRESSION: 1. Chronic L3 logic compression fracture.
2. No evidence of acute fracture.
3. Additional right iliac bone lesions on prior MRI and CT are not
well visualized radiographically.
